# Patient Record
Sex: Female | Born: 1955 | Race: Black or African American | Hispanic: No | Marital: Married | Smoking: Former smoker
Health system: Southern US, Community
[De-identification: ages and names within clinical notes are randomized; demographics above are authoritative.]

## PROBLEM LIST (undated history)

## (undated) DIAGNOSIS — R011 Cardiac murmur, unspecified: Secondary | ICD-10-CM

## (undated) DIAGNOSIS — I071 Rheumatic tricuspid insufficiency: Secondary | ICD-10-CM

## (undated) DIAGNOSIS — C3492 Malignant neoplasm of unspecified part of left bronchus or lung: Secondary | ICD-10-CM

## (undated) DIAGNOSIS — F32A Depression, unspecified: Secondary | ICD-10-CM

## (undated) DIAGNOSIS — I517 Cardiomegaly: Secondary | ICD-10-CM

## (undated) DIAGNOSIS — J986 Disorders of diaphragm: Secondary | ICD-10-CM

## (undated) DIAGNOSIS — D649 Anemia, unspecified: Secondary | ICD-10-CM

## (undated) DIAGNOSIS — E119 Type 2 diabetes mellitus without complications: Secondary | ICD-10-CM

## (undated) DIAGNOSIS — I2510 Atherosclerotic heart disease of native coronary artery without angina pectoris: Secondary | ICD-10-CM

## (undated) DIAGNOSIS — I6789 Other cerebrovascular disease: Secondary | ICD-10-CM

## (undated) DIAGNOSIS — N189 Chronic kidney disease, unspecified: Secondary | ICD-10-CM

## (undated) DIAGNOSIS — F1410 Cocaine abuse, uncomplicated: Secondary | ICD-10-CM

## (undated) DIAGNOSIS — E785 Hyperlipidemia, unspecified: Secondary | ICD-10-CM

## (undated) DIAGNOSIS — Z7982 Long term (current) use of aspirin: Secondary | ICD-10-CM

## (undated) DIAGNOSIS — I739 Peripheral vascular disease, unspecified: Secondary | ICD-10-CM

## (undated) DIAGNOSIS — N184 Chronic kidney disease, stage 4 (severe): Secondary | ICD-10-CM

## (undated) DIAGNOSIS — I10 Essential (primary) hypertension: Secondary | ICD-10-CM

## (undated) DIAGNOSIS — I519 Heart disease, unspecified: Secondary | ICD-10-CM

## (undated) DIAGNOSIS — I361 Nonrheumatic tricuspid (valve) insufficiency: Secondary | ICD-10-CM

## (undated) DIAGNOSIS — I509 Heart failure, unspecified: Secondary | ICD-10-CM

## (undated) DIAGNOSIS — R0600 Dyspnea, unspecified: Secondary | ICD-10-CM

## (undated) DIAGNOSIS — D631 Anemia in chronic kidney disease: Secondary | ICD-10-CM

## (undated) DIAGNOSIS — N185 Chronic kidney disease, stage 5: Secondary | ICD-10-CM

## (undated) DIAGNOSIS — C801 Malignant (primary) neoplasm, unspecified: Secondary | ICD-10-CM

## (undated) HISTORY — PX: LEFT HEART CATH AND CORONARY ANGIOGRAPHY: CATH118249

## (undated) HISTORY — DX: Type 2 diabetes mellitus without complications: E11.9

## (undated) HISTORY — DX: Essential (primary) hypertension: I10

## (undated) HISTORY — DX: Peripheral vascular disease, unspecified: I73.9

## (undated) HISTORY — DX: Depression, unspecified: F32.A

## (undated) HISTORY — PX: OTHER SURGICAL HISTORY: SHX169

## (undated) HISTORY — DX: Hyperlipidemia, unspecified: E78.5

## (undated) HISTORY — PX: PORTA CATH INSERTION: CATH118285

---

## 2004-05-31 ENCOUNTER — Ambulatory Visit: Payer: Self-pay

## 2004-07-06 ENCOUNTER — Ambulatory Visit: Payer: Self-pay

## 2004-08-05 ENCOUNTER — Emergency Department: Payer: Self-pay | Admitting: Unknown Physician Specialty

## 2004-08-06 ENCOUNTER — Emergency Department: Payer: Self-pay | Admitting: Emergency Medicine

## 2005-09-03 ENCOUNTER — Other Ambulatory Visit: Payer: Self-pay

## 2005-09-03 ENCOUNTER — Emergency Department: Payer: Self-pay | Admitting: Emergency Medicine

## 2006-03-05 ENCOUNTER — Other Ambulatory Visit: Payer: Self-pay

## 2006-03-05 ENCOUNTER — Emergency Department: Payer: Self-pay | Admitting: Emergency Medicine

## 2006-04-25 ENCOUNTER — Emergency Department: Payer: Self-pay | Admitting: Emergency Medicine

## 2006-11-16 ENCOUNTER — Emergency Department: Payer: Self-pay | Admitting: Emergency Medicine

## 2006-11-16 ENCOUNTER — Other Ambulatory Visit: Payer: Self-pay

## 2007-06-20 DIAGNOSIS — I219 Acute myocardial infarction, unspecified: Secondary | ICD-10-CM

## 2007-06-20 HISTORY — DX: Acute myocardial infarction, unspecified: I21.9

## 2008-12-13 ENCOUNTER — Emergency Department: Payer: Self-pay | Admitting: Unknown Physician Specialty

## 2010-05-28 ENCOUNTER — Inpatient Hospital Stay: Payer: Self-pay | Admitting: Internal Medicine

## 2010-05-28 DIAGNOSIS — I214 Non-ST elevation (NSTEMI) myocardial infarction: Secondary | ICD-10-CM

## 2010-05-28 HISTORY — DX: Non-ST elevation (NSTEMI) myocardial infarction: I21.4

## 2010-05-30 HISTORY — PX: LEFT HEART CATH AND CORONARY ANGIOGRAPHY: CATH118249

## 2010-06-05 DIAGNOSIS — I214 Non-ST elevation (NSTEMI) myocardial infarction: Secondary | ICD-10-CM | POA: Insufficient documentation

## 2010-06-05 DIAGNOSIS — I252 Old myocardial infarction: Secondary | ICD-10-CM | POA: Insufficient documentation

## 2010-06-06 DIAGNOSIS — E119 Type 2 diabetes mellitus without complications: Secondary | ICD-10-CM | POA: Insufficient documentation

## 2010-06-06 DIAGNOSIS — I2510 Atherosclerotic heart disease of native coronary artery without angina pectoris: Secondary | ICD-10-CM | POA: Insufficient documentation

## 2010-06-06 DIAGNOSIS — E1122 Type 2 diabetes mellitus with diabetic chronic kidney disease: Secondary | ICD-10-CM | POA: Insufficient documentation

## 2012-01-08 ENCOUNTER — Ambulatory Visit: Payer: Self-pay | Admitting: Family Medicine

## 2012-11-11 ENCOUNTER — Emergency Department: Payer: Self-pay | Admitting: Emergency Medicine

## 2012-11-11 LAB — CBC
HCT: 37.8 %
HGB: 13 g/dL
MCH: 30.6 pg
MCHC: 34.5 g/dL
MCV: 89 fL
Platelet: 237 x10 3/mm 3
RBC: 4.25 X10 6/mm 3
RDW: 13.3 %
WBC: 4.2 x10 3/mm 3

## 2012-11-11 LAB — COMPREHENSIVE METABOLIC PANEL WITH GFR
Albumin: 3.4 g/dL
Alkaline Phosphatase: 81 U/L
Anion Gap: 7
BUN: 30 mg/dL — ABNORMAL HIGH
Bilirubin,Total: 0.2 mg/dL
Calcium, Total: 8.9 mg/dL
Chloride: 108 mmol/L — ABNORMAL HIGH
Co2: 24 mmol/L
Creatinine: 1.32 mg/dL — ABNORMAL HIGH
EGFR (African American): 53 — ABNORMAL LOW
EGFR (Non-African Amer.): 46 — ABNORMAL LOW
Glucose: 286 mg/dL — ABNORMAL HIGH
Osmolality: 294
Potassium: 3.9 mmol/L
SGOT(AST): 14 U/L — ABNORMAL LOW
SGPT (ALT): 22 U/L
Sodium: 139 mmol/L
Total Protein: 6.7 g/dL

## 2012-11-11 LAB — URINALYSIS, COMPLETE
Bilirubin,UR: NEGATIVE
Blood: NEGATIVE
Glucose,UR: >=500 mg/dL
Ketone: NEGATIVE
Leukocyte Esterase: NEGATIVE
Nitrite: NEGATIVE
Ph: 5
Protein: 100
RBC,UR: 1 /HPF
Specific Gravity: 1.024
Squamous Epithelial: <1
WBC UR: 3 /HPF

## 2012-11-11 LAB — TROPONIN I: Troponin-I: <0.02 ng/mL

## 2012-11-11 LAB — CK TOTAL AND CKMB (NOT AT ARMC)
CK, Total: 140 U/L (ref 21–215)
CK-MB: 2.5 ng/mL (ref 0.5–3.6)

## 2014-04-01 ENCOUNTER — Observation Stay: Payer: Self-pay | Admitting: Internal Medicine

## 2014-04-01 LAB — COMPREHENSIVE METABOLIC PANEL
AST: 13 U/L — AB (ref 15–37)
Albumin: 3.3 g/dL — ABNORMAL LOW (ref 3.4–5.0)
Alkaline Phosphatase: 83 U/L
Anion Gap: 4 — ABNORMAL LOW (ref 7–16)
BUN: 21 mg/dL — ABNORMAL HIGH (ref 7–18)
Bilirubin,Total: 0.3 mg/dL (ref 0.2–1.0)
CALCIUM: 8.4 mg/dL — AB (ref 8.5–10.1)
CO2: 32 mmol/L (ref 21–32)
Chloride: 105 mmol/L (ref 98–107)
Creatinine: 0.85 mg/dL (ref 0.60–1.30)
EGFR (African American): >60
EGFR (Non-African Amer.): >60
Glucose: 217 mg/dL — ABNORMAL HIGH (ref 65–99)
OSMOLALITY: 291 (ref 275–301)
Potassium: 3.9 mmol/L (ref 3.5–5.1)
SGPT (ALT): 19 U/L
Sodium: 141 mmol/L (ref 136–145)
TOTAL PROTEIN: 6.7 g/dL (ref 6.4–8.2)

## 2014-04-01 LAB — CBC
HCT: 41.8 % (ref 35.0–47.0)
HGB: 13.8 g/dL (ref 12.0–16.0)
MCH: 29.8 pg (ref 26.0–34.0)
MCHC: 33.1 g/dL (ref 32.0–36.0)
MCV: 90 fL (ref 80–100)
PLATELETS: 258 10*3/uL (ref 150–440)
RBC: 4.65 10*6/uL (ref 3.80–5.20)
RDW: 14 % (ref 11.5–14.5)
WBC: 4.7 10*3/uL (ref 3.6–11.0)

## 2014-04-01 LAB — DRUG SCREEN, URINE
Amphetamines, Ur Screen: NEGATIVE (ref ?–1000)
Barbiturates, Ur Screen: NEGATIVE (ref ?–200)
Benzodiazepine, Ur Scrn: NEGATIVE (ref ?–200)
Cannabinoid 50 Ng, Ur ~~LOC~~: NEGATIVE (ref ?–50)
Cocaine Metabolite,Ur ~~LOC~~: NEGATIVE (ref ?–300)
MDMA (ECSTASY) UR SCREEN: NEGATIVE (ref ?–500)
Methadone, Ur Screen: NEGATIVE (ref ?–300)
Opiate, Ur Screen: NEGATIVE (ref ?–300)
Phencyclidine (PCP) Ur S: NEGATIVE (ref ?–25)
TRICYCLIC, UR SCREEN: NEGATIVE (ref ?–1000)

## 2014-04-01 LAB — LIPID PANEL
CHOLESTEROL: 209 mg/dL — AB (ref 0–200)
HDL Cholesterol: 43 mg/dL (ref 40–60)
Ldl Cholesterol, Calc: 147 mg/dL — ABNORMAL HIGH (ref 0–100)
Triglycerides: 94 mg/dL (ref 0–200)
VLDL Cholesterol, Calc: 19 mg/dL (ref 5–40)

## 2014-04-01 LAB — CK TOTAL AND CKMB (NOT AT ARMC)
CK, TOTAL: 68 U/L
CK, Total: 63 U/L
CK, Total: 55 U/L
CK-MB: 0.6 ng/mL (ref 0.5–3.6)
CK-MB: 0.5 ng/mL (ref 0.5–3.6)
CK-MB: 0.6 ng/mL (ref 0.5–3.6)

## 2014-04-01 LAB — TROPONIN I: Troponin-I: <0.02 ng/mL

## 2014-04-01 LAB — HEMOGLOBIN A1C: HEMOGLOBIN A1C: 10.9 % — AB (ref 4.2–6.3)

## 2014-04-01 LAB — APTT: Activated PTT: 24.1 secs (ref 23.6–35.9)

## 2014-04-02 DIAGNOSIS — R079 Chest pain, unspecified: Secondary | ICD-10-CM

## 2014-04-02 LAB — BASIC METABOLIC PANEL
Anion Gap: 5 — ABNORMAL LOW (ref 7–16)
BUN: 19 mg/dL — AB (ref 7–18)
CREATININE: 0.75 mg/dL (ref 0.60–1.30)
Calcium, Total: 8.1 mg/dL — ABNORMAL LOW (ref 8.5–10.1)
Chloride: 108 mmol/L — ABNORMAL HIGH (ref 98–107)
Co2: 28 mmol/L (ref 21–32)
GLUCOSE: 249 mg/dL — AB (ref 65–99)
Osmolality: 292 (ref 275–301)
Potassium: 3.8 mmol/L (ref 3.5–5.1)
Sodium: 141 mmol/L (ref 136–145)

## 2014-04-02 LAB — CBC WITH DIFFERENTIAL/PLATELET
BASOS ABS: 0 10*3/uL (ref 0.0–0.1)
Basophil %: 1.1 %
EOS PCT: 5.4 %
Eosinophil #: 0.2 10*3/uL (ref 0.0–0.7)
HCT: 38.7 % (ref 35.0–47.0)
HGB: 12.9 g/dL (ref 12.0–16.0)
LYMPHS PCT: 40.1 %
Lymphocyte #: 1.5 10*3/uL (ref 1.0–3.6)
MCH: 29.7 pg (ref 26.0–34.0)
MCHC: 33.4 g/dL (ref 32.0–36.0)
MCV: 89 fL (ref 80–100)
MONOS PCT: 8.5 %
Monocyte #: 0.3 x10 3/mm (ref 0.2–0.9)
NEUTROS ABS: 1.7 10*3/uL (ref 1.4–6.5)
NEUTROS PCT: 44.9 %
Platelet: 251 10*3/uL (ref 150–440)
RBC: 4.36 10*6/uL (ref 3.80–5.20)
RDW: 13.9 % (ref 11.5–14.5)
WBC: 3.8 10*3/uL (ref 3.6–11.0)

## 2014-04-06 ENCOUNTER — Encounter: Payer: Self-pay | Admitting: Cardiovascular Disease

## 2014-10-10 NOTE — Discharge Summary (Signed)
PATIENT NAME:  [name] [name], [name] [name] MR#:  [id number] DATE OF BIRTH:  [birth year]  DATE OF ADMISSION:  04/01/2014 DATE OF DISCHARGE:  04/02/2014  ADMITTING DIAGNOSES: Chest pain.   DISCHARGE DIAGNOSES: 1.  History of coronary artery disease.  2.  Hypertension.  3.  Diabetes mellitus type 2, uncontrolled without complication.  4.  Hyperlipidemia.  5.  Tobacco abuse.   CONSULTATIONS: None.   PROCEDURES: Stress Myoview performed April 02, 2014 shows no signs of ischemia, no significant ST or T changes.   BRIEF HISTORY OF PRESENT ILLNESS:  This very pleasant 59 year old female with past medical history of coronary artery disease status post myocardial infarction in 2011 treated at [facility], hypertension, diabetes, hyperlipidemia who has not been on any medications for 5 years at this point presents with chest pain. She states that she recently used cocaine on her birthday, which was 2-1/2 weeks ago. She has been having pain for 4 days. Pain is worse with exertion. She describes heavy pressure in the chest radiating to the back. She also feels that she has had significant dyspnea on exertion recently.   HOSPITAL COURSE AND TREATMENT: 1.  Chest pain: The patient was admitted for acute coronary syndrome Rule out. She did not have any significant changes on her EKG. Cardiac enzymes were negative x3. She had a stress Myoview on the morning of October 15th, which was normal. She is being discharged and will need to follow up with her primary care physician to further evaluate her chest.  2.  History of coronary artery disease: This patient has not had any medications or treatment for about 5 years. She was restarted on aspirin. Also initiated treatment of comorbidities including diabetes and hyperlipidemia.  3.  Diabetes mellitus, type 2, uncontrolled without complication: Hemoglobin A1c is 10.9. She met with diabetes education while in the hospital. She was given information on where to get a glucometer and test  strips at low price. She was given a prescription for Humulin N 7 units subcutaneously twice a day, and she was instructed on how to take this medication as well as signs of hypoglycemia. She does not tolerate metformin, so this medication was not prescribed.  4.  Hyperlipidemia: In this patient a goal LDL would be less than 70. LDL checked during admission is 147. She was started on Pravachol 40 mg daily. This is a 4 dollar medication. She should be able to afford it in the outpatient setting.  5.  Hypertension: Blood pressures were actually fairly reasonable during hospitalization without medication. At the time of discharge, she is 132/80, not on any medications.  6.  Ongoing tobacco abuse: She was counseled on the benefits of quitting smoking. Declined offer of prescription for nicotine replacement.   DISCHARGE PHYSICAL EXAMINATION: VITAL SIGNS: Temperature 98.3, pulse 63, respirations 18, blood pressure 132/80, oxygenation 99% on room air.  GENERAL: No acute distress.  CARDIOVASCULAR: Regular rate and rhythm. No murmurs, rubs, or gallops. No carotid bruit, no JVD. No peripheral edema. Peripheral pulses are 2+.  RESPIRATORY: Lungs are clear to auscultation bilaterally with good air movement.  ABDOMEN: Soft, nontender, nondistended. Bowel sounds are normal.  NEUROLOGIC: Cranial nerves II through XII are grossly intact. Strength and sensation are intact, nonfocal neurologic examination.  PSYCHIATRIC: The patient is alert and oriented, has good insight.   LABORATORY DATA: Sodium 141, potassium 3.8, chloride 108, bicarb 28, BUN 19, creatinine 0.75, blood sugar 249. Hemoglobin A1c 10.9. Total cholesterol 209, LDL 147, HDL 43. LFTs normal. Cardiac enzymes  negative x3. Urine drug screen negative. White blood cells 3.8, hemoglobin 12.9, platelets 251,000, MCV 89.   CONDITION ON DISCHARGE: Stable.  DISPOSITION: Discharged to home with no home health needs.   DISCHARGE MEDICATIONS: 1.  Aspirin 81 mg  daily.  2.  Pravachol 40 mg 1 tablet daily.  3.  Humulin N 100 units/mL subcutaneous suspension 7 units subcutaneously twice a day.   DISCHARGE INSTRUCTIONS: 1.  Diet: ADA carb-modified low fat diet. 2.  Activity: No restrictions.  3.  Follow-up: The patient is to follow up with her primary care provider within the next 1 to 2 weeks after discharge.   TIME SPENT ON DISCHARGE: 40 minutes. ____________________________ Earleen Newport. Volanda Napoleon, MD cpw:sb D: 04/02/2014 15:41:40 ET T: 04/02/2014 16:01:03 ET JOB#: [id number]  cc: Barnetta Chapel P. Volanda Napoleon, MD, <Dictator> Aldean Jewett MD ELECTRONICALLY SIGNED 04/04/2014 21:42

## 2014-10-10 NOTE — H&P (Signed)
PATIENT NAME:  [name] [name], [name] [name] MR#:  [id number] DATE OF BIRTH:  10/18/1955  DATE OF ADMISSION:  04/01/2014  ADMITTING PHYSICIAN: Gladstone Lighter, M.D.   PRIMARY CARE PHYSICIAN: [place].  CHIEF COMPLAINT: Chest pain.   HISTORY OF PRESENT ILLNESS: [name] [name] is a 59 year old, African American female, with past medical history significant for hypertension, diabetes, hyperlipidemia not taking any medications for 5 years now, history of coronary artery disease status post MI in 2011, at [facility]. According to the patient, she did not have any stents put in. She had an angiogram done, however, that did reveal that she had collaterals and small arteries were blocked. The patient continues to smoke cigars every day. She also states she used some cocaine on her birthday, which was about 2-1/2 weeks ago. She has been having the pain for almost 4 days now. She says she does not feel the pain as much when she sits down and rests, but whenever she gets up on her feet to walk, she feels heavy pressure on the chest radiating to the back and also to her arm. Denies any diaphoresis associated with that. Mild nausea is also noted. She does not seem like she has significant dyspnea on exertion, as well.   PAST MEDICAL HISTORY: 1. Hypertension.  2. Diabetes.  3. Coronary artery disease.  4. Hyperlipidemia.  5. Ongoing smoking.   PAST SURGICAL HISTORY: None.  ALLERGIES TO MEDICATIONS: No known drug allergies.   CURRENT HOME MEDICATIONS: Aspirin 3 mg p.o. daily. Does not take any other medications.   SOCIAL HISTORY: Lives at home with her son, smokes about 3 to 4 cigars every day. Stopped smoking cigarettes more than 20 years ago. No alcohol abuse. Again, first time use of cocaine about 2-1/2 weeks ago.   FAMILY HISTORY: Significant for mom died from a major heart attack in her 43s.   REVIEW OF SYSTEMS:  CONSTITUTIONAL: No fever, fatigue, or weakness.  EYES: No blurred vision, double vision, inflammation or  glaucoma.  ENT: No tinnitus, ear pain, hearing loss, epistaxis or discharge.  RESPIRATORY: No cough, wheeze, hemoptysis or COPD.  CARDIOVASCULAR: Positive for chest pain. No orthopnea, edema, arrhythmia, palpitations, or syncope.  GASTROINTESTINAL: Positive for some nausea. No vomiting, diarrhea, abdominal pain, hematemesis, or melena.  GENITOURINARY: No dysuria, hematuria, renal calculus, frequency, or incontinence.  ENDOCRINE: No polyuria, nocturia, thyroid problems, heat or cold intolerance.  HEMATOLOGY: No anemia, easy bruising or bleeding.  SKIN: No acne, rash or lesions.  MUSCULOSKELETAL: No neck, back, shoulder pain, arthritis or gout.  NEUROLOGIC: No numbness, weakness, CVA, TIA or seizures.  PSYCHOLOGIC: No anxiety, insomnia or depression.   PHYSICAL EXAMINATION:  VITAL SIGNS: Temperature 98 degrees Fahrenheit, pulse 71, respirations 18, blood pressure 172/89, pulse oximetry 99% on room air.  GENERAL: Well built, well-nourished female lying in bed, not in any acute distress.  HEENT: Normocephalic, atraumatic. Pupils equal, round, reacting to light. Anicteric sclerae. Extraocular movements intact. Oropharynx clear without erythema, mass or exudates.  NECK: Supple. No thyromegaly, JVD or carotid bruits. No lymphadenopathy.  LUNGS: Moving air bilaterally. No wheeze or crackles. No use of accessory muscles for breathing.  CARDIOVASCULAR: S1, S2, regular rate and rhythm. No murmurs, rubs, or gallops.  ABDOMEN: Soft, nontender, nondistended. No hepatosplenomegaly. Normal bowel sounds.  EXTREMITIES: No pedal edema. No clubbing or cyanosis, 2+ dorsalis pedis pulses palpable bilaterally.  SKIN: No acne, rash or lesions.  LYMPHATICS: No cervical or inguinal lymphadenopathy.  NEUROLOGIC: Cranial nerves intact. No focal, motor or sensory deficits.  Cerebellar function tests are normal.  PSYCHOLOGIC: The patient is awake, alert, oriented x 3.   LABORATORY DATA: WBC 4.7, hemoglobin 13.8,  hematocrit 41.8, platelet count 258,000.   Sodium 141, potassium 3.9, chloride 105, bicarbonate 32, BUN 21, creatinine 0.85, glucose 217, calcium of 8.4.   ALT 19, AST 13, alkaline phosphatase 83, total bilirubin 0.3, albumin of 3.3. CK 263, CK-MB 0.6. Troponin less than 0.02, PTT 24.1.   Chest x-ray showing clear lung fields. No acute cardiopulmonary disease.   EKG showing normal sinus rhythm, heart rate of 65, no acute ST-T wave abnormalities.   ASSESSMENT AND PLAN: A 59 year old female with diabetes, hypertension not on any medications, and also history of coronary artery disease, admitted for chest pain.   1. Chest pain, likely unstable angina, multiple risk factors presented. We will admit to telemetry, get a Myoview tomorrow. Follow up troponins. Also history of cocaine use 2 weeks ago. Start aspirin for now.  2. Hypertension. Monitor blood pressure. Add medications as needed. We will hold off on beta blockers until the drug screen is back.  3. Diabetes mellitus. Checked HbA1c, showed already elevated, will need medications at discharge. Start on sliding scale insulin for now.  4. Deep vein thrombosis prophylaxis.   CODE STATUS: Full Code.   TIME SPENT ON ADMISSION: 50 minutes.   ____________________________ Gladstone Lighter, MD rk:JT D: 04/01/2014 12:29:07 ET T: 04/01/2014 13:29:24 ET JOB#: [id number]  cc: Gladstone Lighter, MD, <Dictator> [place] MD ELECTRONICALLY SIGNED 04/01/2014 18:19

## 2014-11-26 DIAGNOSIS — F17200 Nicotine dependence, unspecified, uncomplicated: Secondary | ICD-10-CM | POA: Insufficient documentation

## 2016-11-16 DIAGNOSIS — F1490 Cocaine use, unspecified, uncomplicated: Secondary | ICD-10-CM | POA: Insufficient documentation

## 2017-12-16 ENCOUNTER — Other Ambulatory Visit: Payer: Self-pay

## 2017-12-16 ENCOUNTER — Emergency Department
Admission: EM | Admit: 2017-12-16 | Discharge: 2017-12-16 | Disposition: A | Discharge status: Home / Self Care | Payer: BLUE CROSS/BLUE SHIELD | Attending: Emergency Medicine | Admitting: Emergency Medicine

## 2017-12-16 DIAGNOSIS — F419 Anxiety disorder, unspecified: Secondary | ICD-10-CM | POA: Diagnosis not present

## 2017-12-16 DIAGNOSIS — F17210 Nicotine dependence, cigarettes, uncomplicated: Secondary | ICD-10-CM | POA: Insufficient documentation

## 2017-12-16 DIAGNOSIS — R350 Frequency of micturition: Secondary | ICD-10-CM | POA: Insufficient documentation

## 2017-12-16 DIAGNOSIS — N390 Urinary tract infection, site not specified: Secondary | ICD-10-CM

## 2017-12-16 DIAGNOSIS — E1165 Type 2 diabetes mellitus with hyperglycemia: Secondary | ICD-10-CM | POA: Diagnosis not present

## 2017-12-16 DIAGNOSIS — F410 Panic disorder [episodic paroxysmal anxiety] without agoraphobia: Secondary | ICD-10-CM | POA: Diagnosis present

## 2017-12-16 DIAGNOSIS — R739 Hyperglycemia, unspecified: Secondary | ICD-10-CM

## 2017-12-16 HISTORY — DX: Type 2 diabetes mellitus without complications: E11.9

## 2017-12-16 LAB — URINALYSIS, COMPLETE (UACMP) WITH MICROSCOPIC
Bilirubin Urine: NEGATIVE
KETONES UR: NEGATIVE mg/dL
LEUKOCYTES UA: NEGATIVE
NITRITE: NEGATIVE
PH: 5 (ref 5.0–8.0)
Protein, ur: NEGATIVE mg/dL
Specific Gravity, Urine: 1.022 (ref 1.005–1.030)

## 2017-12-16 LAB — BASIC METABOLIC PANEL
Anion gap: 7 (ref 5–15)
BUN: 34 mg/dL — AB (ref 6–20)
CALCIUM: 8.6 mg/dL — AB (ref 8.9–10.3)
CHLORIDE: 100 mmol/L (ref 98–111)
CO2: 25 mmol/L (ref 22–32)
Creatinine, Ser: 1 mg/dL (ref 0.44–1.00)
GFR calc non Af Amer: >60 mL/min (ref >60)
GLUCOSE: 747 mg/dL — AB (ref 70–99)
Potassium: 4.6 mmol/L (ref 3.5–5.1)
Sodium: 132 mmol/L — ABNORMAL LOW (ref 135–145)

## 2017-12-16 LAB — BLOOD GAS, VENOUS
ACID-BASE DEFICIT: 2.5 mmol/L — AB (ref 0.0–2.0)
BICARBONATE: 24.9 mmol/L (ref 20.0–28.0)
O2 SAT: 56.4 %
PATIENT TEMPERATURE: 37
pCO2, Ven: 53 mmHg (ref 44.0–60.0)
pH, Ven: 7.28 (ref 7.250–7.430)
pO2, Ven: 34 mmHg (ref 32.0–45.0)

## 2017-12-16 LAB — CBC
HCT: 36.7 % (ref 35.0–47.0)
Hemoglobin: 12.3 g/dL (ref 12.0–16.0)
MCH: 30.5 pg (ref 26.0–34.0)
MCHC: 33.4 g/dL (ref 32.0–36.0)
MCV: 91.3 fL (ref 80.0–100.0)
PLATELETS: 307 10*3/uL (ref 150–440)
RBC: 4.02 MIL/uL (ref 3.80–5.20)
RDW: 13.5 % (ref 11.5–14.5)
WBC: 5.1 10*3/uL (ref 3.6–11.0)

## 2017-12-16 LAB — GLUCOSE, CAPILLARY
GLUCOSE-CAPILLARY: 112 mg/dL — AB (ref 70–99)
Glucose-Capillary: >600 mg/dL (ref 70–99)
Glucose-Capillary: 183 mg/dL — ABNORMAL HIGH (ref 70–99)

## 2017-12-16 LAB — POC URINE PREG, ED: Preg Test, Ur: NEGATIVE

## 2017-12-16 MED ORDER — INSULIN GLARGINE 100 UNIT/ML ~~LOC~~ SOLN: SUBCUTANEOUS | 0 refills | Status: DC

## 2017-12-16 MED ORDER — SODIUM CHLORIDE 0.9 % IV BOLUS
1000.0000 mL | Freq: Once | INTRAVENOUS | Status: AC
  Administered 2017-12-16: 1000 mL via INTRAVENOUS

## 2017-12-16 MED ORDER — METFORMIN HCL 500 MG PO TABS: 500.0000 mg | ORAL_TABLET | Freq: Two times a day (BID) | ORAL | 0 refills | Status: DC

## 2017-12-16 MED ORDER — INSULIN ASPART 100 UNIT/ML ~~LOC~~ SOLN
10.0000 [IU] | Freq: Once | SUBCUTANEOUS | Status: AC
  Administered 2017-12-16: 10 [IU] via INTRAVENOUS
  Filled 2017-12-16: qty 1

## 2017-12-16 MED ORDER — SODIUM CHLORIDE 0.9 % IV SOLN
1.0000 g | Freq: Once | INTRAVENOUS | Status: AC
  Administered 2017-12-16: 1 g via INTRAVENOUS
  Filled 2017-12-16: qty 10

## 2017-12-16 MED ORDER — CEPHALEXIN 500 MG PO CAPS: 500.0000 mg | ORAL_CAPSULE | Freq: Two times a day (BID) | ORAL | 0 refills | Status: AC

## 2017-12-16 NOTE — ED Triage Notes (Signed)
Pt states she has increased urination so thinking she has hyperglycemia. Type 2 DM. Doesn't know last time she checked her sugar, can't find her meter. Doesn't take any medication for DM. States has been out of her pills and insulin x 2 weeks. Also had a panic attack at work, states that has resolved. States her claustrophobia started her panic attack today. No hx of same. Alert, oriented, ambulatory. No distress noted.

## 2017-12-16 NOTE — ED Notes (Signed)
Pt eating sandwich tray at this time

## 2017-12-16 NOTE — ED Provider Notes (Addendum)
[facility] Emergency Department Provider Note  Time seen: 3:25 PM  I have reviewed the triage vital signs and the nursing notes.   HISTORY  Chief Complaint Hyperglycemia    HPI [name] [name] is a 62 y.o. female with a past medical history of diabetes on Lantus and metformin who presents to the emergency department for symptoms of a panic attack and frequent urination.  According to the patient for the past week or so she has been urinating very frequently she states that x5 or 6 times during the nighttime.  States mild dysuria.  She states the main reason for visit today is she was in a very tight space working today, states she is claustrophobic and had a "panic attack."  States she does not like to be in small spaces.  She started to feel like she could not breathe, she states once she was able to leave that room and going to an open space she felt better, but still wanted to get evaluated due to the frequent urination and states she has been taking her blood glucose at home and it has been greater than 300, states she ran out of her medications approximately 1 or 2 weeks ago.  Was taking 16 units of Lantus at night and metformin once daily per patient.  Denies any fever, cough or congestion chest pain, abdominal pain, nausea vomiting.  Does state loose stool on occasion as well as slight dysuria.   Past Medical History:  Diagnosis Date  . Diabetes mellitus without complication ([place])     There are no active problems to display for this patient.   History reviewed. No pertinent surgical history.  Prior to Admission medications   Not on File    No Known Allergies  History reviewed. No pertinent family history.  Social History Social History   Tobacco Use  . Smoking status: Current Every Day Smoker  Substance Use Topics  . Alcohol use: Never    Frequency: Never  . Drug use: Not on file    Review of Systems Constitutional: Negative for  fever. Eyes: Negative for visual complaints ENT: Negative for recent illness/congestion Cardiovascular: Negative for chest pain. Respiratory: Negative for shortness of breath. Gastrointestinal: Negative for abdominal pain, vomiting.  Mild loose stool. Genitourinary: Mild dysuria with urinary frequency. Musculoskeletal: Negative for musculoskeletal complaints Skin: Negative for skin complaints  Neurological: Negative for headache All other ROS negative  ____________________________________________   PHYSICAL EXAM:  VITAL SIGNS: ED Triage Vitals  Enc Vitals Group     BP 12/16/17 1444 (!) 154/83     Pulse Rate 12/16/17 1444 92     Resp 12/16/17 1444 16     Temp 12/16/17 1444 98.1 F (36.7 C)     Temp Source 12/16/17 1444 Oral     SpO2 12/16/17 1444 99 %     Weight 12/16/17 1445 100 lb (45.4 kg)     Height 12/16/17 1445 5\' 5"  (1.651 m)     Head Circumference --      Peak Flow --      Pain Score 12/16/17 1444 10     Pain Loc --      Pain Edu? --      Excl. in [place]? --     Constitutional: Alert and oriented. Well appearing and in no distress. Eyes: Normal exam ENT   Head: Normocephalic and atraumatic.   Mouth/Throat: Mucous membranes are moist. Cardiovascular: Normal rate, regular rhythm. No murmur Respiratory: Normal respiratory effort  without tachypnea nor retractions. Breath sounds are clear Gastrointestinal: Soft and nontender. No distention.  Musculoskeletal: Nontender with normal range of motion in all extremities.  Neurologic:  Normal speech and language. No gross focal neurologic deficits  Skin:  Skin is warm, dry and intact.  Psychiatric: Mood and affect are normal. ____________________________________________  INITIAL IMPRESSION / ASSESSMENT AND PLAN / ED COURSE  Pertinent labs & imaging results that were available during my care of the patient were reviewed by me and considered in my medical decision making (see chart for details).  Patient presents to  the emergency department for frequent urination, panic attack and high blood glucose at home.  Differential includes hyperglycemia, DKA, HHS, anxiety, dyspnea due to other reason.  Overall the patient appears very well, no distress.  States her symptoms completely resolved after leaving the small room, highly suspect claustrophobia/panic disorder.  For her secondary complaint the patient states she has been out of her diabetic medications for greater than 1 week, has been checking her blood glucose at times at home and it has been over 300 per patient and she has been urinating very frequently, suspect likely hyperglycemia/HHS/DKA, possible urinary tract infection, will check urinalysis we will begin IV hydration.  Patient's VBG does show a pH of 7.28, will await anion gap as well as potassium results before dosing insulin.  Patient's vitals are very reassuring, she states she does not want to be admitted to the hospital we will attempt IV hydration and glucose correction in the emergency department as long as the patient does not have a significant anion gap.  Patient's blood glucose of 747 has decreased to near normal levels after IV fluids and insulin.  The remainder the patient's work-up is consistent for urinary tract infection, patient has been dose Rocephin.  Patient feels well, is asking to be discharged home, has eaten and drinking in the emergency department without issue.  We will discharge with antibiotics and refills of her metformin and Lantus medications.  Patient agreeable to plan of care.  Has a doctor appointment scheduled but not for 2 weeks.  We will provide a work note including her diagnosis per patient request.    ____________________________________________   FINAL CLINICAL IMPRESSION(S) / ED DIAGNOSES  Hyperglycemia Anxiety    Harvest Dark, MD 12/16/17 1821    Harvest Dark, MD 12/16/17 [phone]

## 2017-12-16 NOTE — ED Notes (Signed)
First Nurse Note: Pt to ED c/o high blood sugar. Pt states that she has hx/o DM

## 2017-12-19 LAB — URINE CULTURE: Culture: >=100000 — AB

## 2018-06-19 HISTORY — PX: OTHER SURGICAL HISTORY: SHX169

## 2018-07-08 ENCOUNTER — Emergency Department
Admission: EM | Admit: 2018-07-08 | Discharge: 2018-07-08 | Disposition: A | Discharge status: Home / Self Care | Payer: BLUE CROSS/BLUE SHIELD | Attending: Emergency Medicine | Admitting: Emergency Medicine

## 2018-07-08 ENCOUNTER — Other Ambulatory Visit: Payer: Self-pay

## 2018-07-08 ENCOUNTER — Emergency Department: Payer: BLUE CROSS/BLUE SHIELD

## 2018-07-08 DIAGNOSIS — E119 Type 2 diabetes mellitus without complications: Secondary | ICD-10-CM | POA: Diagnosis not present

## 2018-07-08 DIAGNOSIS — F17210 Nicotine dependence, cigarettes, uncomplicated: Secondary | ICD-10-CM | POA: Diagnosis not present

## 2018-07-08 DIAGNOSIS — M25562 Pain in left knee: Secondary | ICD-10-CM | POA: Diagnosis present

## 2018-07-08 MED ORDER — NAPROXEN 500 MG PO TABS: 500.0000 mg | ORAL_TABLET | Freq: Two times a day (BID) | ORAL | 0 refills | Status: DC

## 2018-07-08 MED ORDER — KETOROLAC TROMETHAMINE 30 MG/ML IJ SOLN
30.0000 mg | Freq: Once | INTRAMUSCULAR | Status: AC
  Administered 2018-07-08: 30 mg via INTRAMUSCULAR
  Filled 2018-07-08: qty 1

## 2018-07-08 MED ORDER — TRAMADOL HCL 50 MG PO TABS: 50.0000 mg | ORAL_TABLET | Freq: Four times a day (QID) | ORAL | 0 refills | Status: DC | PRN

## 2018-07-08 MED ORDER — OXYCODONE-ACETAMINOPHEN 5-325 MG PO TABS
1.0000 | ORAL_TABLET | Freq: Once | ORAL | Status: AC
  Administered 2018-07-08: 1 via ORAL
  Filled 2018-07-08: qty 1

## 2018-07-08 NOTE — ED Provider Notes (Signed)
[facility] Emergency Department Provider Note  ____________________________________________  Time seen: Approximately 10:53 AM  I have reviewed the triage vital signs and the nursing notes.   HISTORY  Chief Complaint Knee Pain    HPI [name] [name] is a 63 y.o. female presents emergency department for evaluation of left knee pain for 2 days.  Patient states that pain is primarily over the front and inside of her knee.  She does not have any pain to the back of her knee or lower leg.  Patient states that it feels like a "bone needs to pop."  She is having difficulty getting comfortable due to pain. She describes the pain as throbbing.  No trauma.  This is never happened before.  She denies any IV drug use.  No wounds.  No history of gout.  No fever or chills.  She took aspirin last night, without relief.  Past Medical History:  Diagnosis Date  . Diabetes mellitus without complication ([place])     There are no active problems to display for this patient.   History reviewed. No pertinent surgical history.  Prior to Admission medications   Medication Sig Start Date End Date Taking? Authorizing Provider  insulin glargine (LANTUS) 100 UNIT/ML injection Please inject 16 units SQ each night 12/16/17 12/16/18  Harvest Dark, MD  metFORMIN (GLUCOPHAGE) 500 MG tablet Take 1 tablet (500 mg total) by mouth 2 (two) times daily with a meal. 12/16/17 12/16/18  Harvest Dark, MD  naproxen (NAPROSYN) 500 MG tablet Take 1 tablet (500 mg total) by mouth 2 (two) times daily with a meal. 07/08/18 07/08/19  Laban Emperor, PA-C  traMADol (ULTRAM) 50 MG tablet Take 1 tablet (50 mg total) by mouth every 6 (six) hours as needed. 07/08/18 07/08/19  Laban Emperor, PA-C    Allergies Patient has no known allergies.  No family history on file.  Social History Social History   Tobacco Use  . Smoking status: Current Every Day Smoker  . Smokeless tobacco: Never Used  Substance  Use Topics  . Alcohol use: Never    Frequency: Never  . Drug use: Not Currently     Review of Systems  Constitutional: No fever/chills Cardiovascular: No chest pain. Respiratory: No SOB. Gastrointestinal: No abdominal pain.  No nausea, no vomiting.  Musculoskeletal: Positive for knee pain. Skin: Negative for rash, abrasions, lacerations, ecchymosis. Neurological: Negative for numbness or tingling   ____________________________________________   PHYSICAL EXAM:  VITAL SIGNS: ED Triage Vitals  Enc Vitals Group     BP 07/08/18 1047 120/82     Pulse Rate 07/08/18 1047 88     Resp 07/08/18 1047 15     Temp 07/08/18 1047 98.9 F (37.2 C)     Temp Source 07/08/18 1047 Oral     SpO2 07/08/18 1047 99 %     Weight 07/08/18 1048 100 lb (45.4 kg)     Height 07/08/18 1048 5\' 5"  (1.651 m)     Head Circumference --      Peak Flow --      Pain Score --      Pain Loc --      Pain Edu? --      Excl. in [place]? --      Constitutional: Alert and oriented. Well appearing and in no acute distress. Eyes: Conjunctivae are normal. PERRL. EOMI. Head: Atraumatic. ENT:      Ears:      Nose: No congestion/rhinnorhea.      Mouth/Throat: Mucous  membranes are moist.  Neck: No stridor. Cardiovascular: Normal rate, regular rhythm.  Good peripheral circulation. Respiratory: Normal respiratory effort without tachypnea or retractions. Lungs CTAB. Good air entry to the bases with no decreased or absent breath sounds. Musculoskeletal: Full range of motion to all extremities. No gross deformities appreciated.  Mild tenderness to palpation over medial knee.  Full range of motion of knee but with pain.  No tenderness to palpation to posterior knee.  No calf tenderness.  No erythema or visible swelling. Neurologic:  Normal speech and language. No gross focal neurologic deficits are appreciated.  Skin:  Skin is warm, dry and intact. No rash noted. Psychiatric: Mood and affect are normal. Speech and behavior  are normal. Patient exhibits appropriate insight and judgement.   ____________________________________________   LABS (all labs ordered are listed, but only abnormal results are displayed)  Labs Reviewed - No data to display ____________________________________________  EKG   ____________________________________________  RADIOLOGY Robinette Haines, personally viewed and evaluated these images (plain radiographs) as part of my medical decision making, as well as reviewing the written report by the radiologist.  Dg Knee Complete 4 Views Left  Result Date: 07/08/2018 CLINICAL DATA:  Knee pain. EXAM: LEFT KNEE - COMPLETE 4+ VIEW COMPARISON:  None FINDINGS: No evidence of fracture, dislocation, or joint effusion. No evidence of arthropathy or other focal bone abnormality. Soft tissues are unremarkable. IMPRESSION: Negative. Electronically Signed   By: Kerby Moors M.D.   On: 07/08/2018 11:14    ____________________________________________    PROCEDURES  Procedure(s) performed:    Procedures    Medications  ketorolac (TORADOL) 30 MG/ML injection 30 mg (30 mg Intramuscular Given 07/08/18 1137)  oxyCODONE-acetaminophen (PERCOCET/ROXICET) 5-325 MG per tablet 1 tablet (1 tablet Oral Given 07/08/18 1136)     ____________________________________________   INITIAL IMPRESSION / ASSESSMENT AND PLAN / ED COURSE  Pertinent labs & imaging results that were available during my care of the patient were reviewed by me and considered in my medical decision making (see chart for details).  Review of the [place] CSRS was performed in accordance of the [place] prior to dispensing any controlled drugs.     Patient presented to emergency department for evaluation of knee pain.  Vital signs and exam are reassuring.  Exam is overall unremarkable.  Knee x-ray negative for acute abnormalities.  No signs of infection or gout.  Knee was Ace wrapped.  Crutches were given.  Patient will be discharged  home with prescriptions for naproxen and a short course of tramadol. Patient is to follow up with primary care as directed. Patient is given ED precautions to return to the ED for any worsening or new symptoms.     ____________________________________________  FINAL CLINICAL IMPRESSION(S) / ED DIAGNOSES  Final diagnoses:  Acute pain of left knee      NEW MEDICATIONS STARTED DURING THIS VISIT:  ED Discharge Orders         Ordered    Knee brace     07/08/18 1156    naproxen (NAPROSYN) 500 MG tablet  2 times daily with meals     07/08/18 1156    traMADol (ULTRAM) 50 MG tablet  Every 6 hours PRN     07/08/18 1156              This chart was dictated using voice recognition software/Dragon. Despite best efforts to proofread, errors can occur which can change the meaning. Any change was purely unintentional.    Laban Emperor, PA-C  07/08/18 1459    Earleen Newport, MD 07/08/18 1504

## 2018-07-08 NOTE — ED Notes (Signed)
See triage note presents with pain to left knee   Pain is mainly to lateral aspect  With min swelling  Denies any recent injury

## 2018-07-08 NOTE — ED Triage Notes (Signed)
Pt c/o left knee pain since last night, denies any injury or swelling.

## 2018-10-19 ENCOUNTER — Other Ambulatory Visit: Payer: Self-pay

## 2018-10-19 ENCOUNTER — Encounter: Payer: Self-pay | Admitting: Emergency Medicine

## 2018-10-19 ENCOUNTER — Emergency Department
Admission: EM | Admit: 2018-10-19 | Discharge: 2018-10-19 | Disposition: A | Discharge status: Home / Self Care | Payer: BLUE CROSS/BLUE SHIELD | Attending: Emergency Medicine | Admitting: Emergency Medicine

## 2018-10-19 DIAGNOSIS — E11649 Type 2 diabetes mellitus with hypoglycemia without coma: Secondary | ICD-10-CM | POA: Diagnosis present

## 2018-10-19 DIAGNOSIS — Z79899 Other long term (current) drug therapy: Secondary | ICD-10-CM | POA: Insufficient documentation

## 2018-10-19 DIAGNOSIS — Z794 Long term (current) use of insulin: Secondary | ICD-10-CM | POA: Diagnosis not present

## 2018-10-19 DIAGNOSIS — F17200 Nicotine dependence, unspecified, uncomplicated: Secondary | ICD-10-CM | POA: Insufficient documentation

## 2018-10-19 DIAGNOSIS — E162 Hypoglycemia, unspecified: Secondary | ICD-10-CM

## 2018-10-19 LAB — CBC WITH DIFFERENTIAL/PLATELET
Abs Immature Granulocytes: 0.01 10*3/uL (ref 0.00–0.07)
Basophils Absolute: 0 10*3/uL (ref 0.0–0.1)
Basophils Relative: 1 %
Eosinophils Absolute: 0.4 10*3/uL (ref 0.0–0.5)
Eosinophils Relative: 8 %
HCT: 36.8 % (ref 36.0–46.0)
Hemoglobin: 11.8 g/dL — ABNORMAL LOW (ref 12.0–15.0)
Immature Granulocytes: 0 %
Lymphocytes Relative: 31 %
Lymphs Abs: 1.5 10*3/uL (ref 0.7–4.0)
MCH: 29.1 pg (ref 26.0–34.0)
MCHC: 32.1 g/dL (ref 30.0–36.0)
MCV: 90.9 fL (ref 80.0–100.0)
Monocytes Absolute: 0.3 10*3/uL (ref 0.1–1.0)
Monocytes Relative: 5 %
Neutro Abs: 2.7 10*3/uL (ref 1.7–7.7)
Neutrophils Relative %: 55 %
Platelets: 350 10*3/uL (ref 150–400)
RBC: 4.05 MIL/uL (ref 3.87–5.11)
RDW: 13.8 % (ref 11.5–15.5)
WBC: 5 10*3/uL (ref 4.0–10.5)
nRBC: 0 % (ref 0.0–0.2)

## 2018-10-19 LAB — BASIC METABOLIC PANEL
Anion gap: 10 (ref 5–15)
BUN: 27 mg/dL — ABNORMAL HIGH (ref 6–20)
CO2: 25 mmol/L (ref 22–32)
Calcium: 9 mg/dL (ref 8.9–10.3)
Chloride: 105 mmol/L (ref 98–111)
Creatinine, Ser: 0.93 mg/dL (ref 0.44–1.00)
GFR calc Af Amer: >60 mL/min (ref >60)
GFR calc non Af Amer: >60 mL/min (ref >60)
Glucose, Bld: 194 mg/dL — ABNORMAL HIGH (ref 70–99)
Potassium: 4.3 mmol/L (ref 3.5–5.1)
Sodium: 140 mmol/L (ref 135–145)

## 2018-10-19 NOTE — ED Provider Notes (Signed)
[facility] Emergency Department Provider Note   ____________________________________________   First MD Initiated Contact with Patient 10/19/18 1037     (approximate)  I have reviewed the triage vital signs and the nursing notes.   HISTORY  Chief Complaint Hypoglycemia    HPI [name] [name] is a 63 y.o. female who reports she was recently started on metformin.  Since then she has been having trouble with low blood sugar.  Her Lantus was dropped from 15-10 but she still having trouble with low blood sugar.  She is eating about the same amount of food every day.  She is exercising about the same amount every day.  She is not having any symptoms of illness.  Sugar went down to 40 earlier today.  She reports when her sugar goes down to about 150 she gets very tachycardic.  Her doctor is spoken with her about the need to be able to keep her sugar down around 150 and let her body get used to the sugar being around 150.  We just worried about going down to 40.      Past Medical History:  Diagnosis Date  . Diabetes mellitus without complication ([place])     There are no active problems to display for this patient.   History reviewed. No pertinent surgical history.  Prior to Admission medications   Medication Sig Start Date End Date Taking? Authorizing Provider  atorvastatin (LIPITOR) 20 MG tablet Take 20 mg by mouth daily.   Yes [provider]  insulin glargine (LANTUS) 100 UNIT/ML injection Please inject 16 units SQ each night Patient taking differently: Inject 15 Units into the skin daily. Please inject 16 units SQ each night 12/16/17 12/16/18 Yes Paduchowski, Lennette Bihari, MD  lisinopril (ZESTRIL) 5 MG tablet Take 5 mg by mouth daily.   Yes [provider]  metFORMIN (GLUCOPHAGE-XR) 500 MG 24 hr tablet Take 500 mg by mouth 2 (two) times daily.   Yes [provider]  metoprolol succinate (TOPROL-XL) 25 MG 24 hr tablet Take 25 mg by mouth  daily.   Yes [provider]    Allergies Patient has no known allergies.  No family history on file.  Social History Social History   Tobacco Use  . Smoking status: Current Every Day Smoker  . Smokeless tobacco: Never Used  Substance Use Topics  . Alcohol use: Never    Frequency: Never  . Drug use: Not Currently    Review of Systems  Constitutional: No fever/chills Eyes: No visual changes. ENT: No sore throat. Cardiovascular: Denies chest pain. Respiratory: Denies shortness of breath. Gastrointestinal: No abdominal pain.  No nausea, no vomiting.  No diarrhea.  No constipation. Genitourinary: Negative for dysuria. Musculoskeletal: Negative for back pain. Skin: Negative for rash. Neurological: Negative for headaches, focal weakness   ____________________________________________   PHYSICAL EXAM:  VITAL SIGNS: ED Triage Vitals  Enc Vitals Group     BP 10/19/18 1030 130/73     Pulse Rate 10/19/18 1030 75     Resp 10/19/18 1030 18     Temp 10/19/18 1030 98.2 F (36.8 C)     Temp Source 10/19/18 1030 Oral     SpO2 10/19/18 1030 100 %     Weight 10/19/18 1032 100 lb (45.4 kg)     Height 10/19/18 1032 5\' 5"  (1.651 m)     Head Circumference --      Peak Flow --      Pain Score 10/19/18 1032 0  Pain Loc --      Pain Edu? --      Excl. in [place]? --     Constitutional: Alert and oriented. Well appearing and in no acute distress. Eyes: Conjunctivae are normal.  Head: Atraumatic. Nose: No congestion/rhinnorhea. Mouth/Throat: Mucous membranes are moist.  Oropharynx non-erythematous. Neck: No stridor.   Cardiovascular: Normal rate, regular rhythm. Grossly normal heart sounds.  Good peripheral circulation. Respiratory: Normal respiratory effort.  No retractions. Lungs CTAB. Gastrointestinal: Soft and nontender. No distention. No abdominal bruits. No CVA tenderness. Musculoskeletal: No lower extremity tenderness nor edema.   Neurologic:  Normal speech and  language. No gross focal neurologic deficits are appreciated.  Skin:  Skin is warm, dry and intact. No rash noted.   ____________________________________________   LABS (all labs ordered are listed, but only abnormal results are displayed)  Labs Reviewed  CBC WITH DIFFERENTIAL/PLATELET - Abnormal; Notable for the following components:      Result Value   Hemoglobin 11.8 (*)    All other components within normal limits  BASIC METABOLIC PANEL - Abnormal; Notable for the following components:   Glucose, Bld 194 (*)    BUN 27 (*)    All other components within normal limits   ____________________________________________  EKG   ____________________________________________  RADIOLOGY  ED MD interpretation:   Official radiology report(s): No results found.  ____________________________________________   PROCEDURES  Procedure(s) performed (including Critical Care):  Procedures   ____________________________________________   INITIAL IMPRESSION / ASSESSMENT AND PLAN / ED COURSE  Patient with episodes of hypoglycemia continuing in spite of no change in diet or exercise.  We will decrease her insulin some more.  Her follow-up with her doctor.             ____________________________________________   FINAL CLINICAL IMPRESSION(S) / ED DIAGNOSES  Final diagnoses:  Hypoglycemia     ED Discharge Orders    None       Note:  This document was prepared using Dragon voice recognition software and may include unintentional dictation errors.    Nena Polio, MD 10/19/18 1122

## 2018-10-19 NOTE — Discharge Instructions (Addendum)
Please decrease your insulin by another 5 units a day.  Keep close track of your blood sugar.  Do not let it go much over 200.  If it begins to go up again increase your insulin back by 3 units.  Return here again if your sugar drops below 60.  Keep in close contact with your doctor.

## 2018-10-19 NOTE — ED Notes (Signed)
Pt c/o episodes of low blood sugar. Pt states that her medications were changed about 1 month ago, she had episodes of low blood sugar and called her doctor, insulin was decreased from 20 units to 15 units, pt states that she was told if her blood sugar continues to run low to decrease dose to 13 units. Pt is also taking 3 oral diabetic medications in addition to insulin. Pt reports episodes of blood sugar dropping into the 40's.

## 2018-10-19 NOTE — ED Triage Notes (Signed)
States has had episode of low blood sugar x 1 month since changed medicines. States has 2 episodes yesterday evening and 2 this am with blood sugar dropping to 40s. Now alert, skin warm and dry.

## 2018-11-26 DIAGNOSIS — I779 Disorder of arteries and arterioles, unspecified: Secondary | ICD-10-CM | POA: Insufficient documentation

## 2018-12-05 HISTORY — PX: OTHER SURGICAL HISTORY: SHX169

## 2019-01-06 DIAGNOSIS — G629 Polyneuropathy, unspecified: Secondary | ICD-10-CM | POA: Insufficient documentation

## 2019-04-02 DIAGNOSIS — N879 Dysplasia of cervix uteri, unspecified: Secondary | ICD-10-CM | POA: Insufficient documentation

## 2019-06-20 HISTORY — PX: OTHER SURGICAL HISTORY: SHX169

## 2019-08-01 ENCOUNTER — Ambulatory Visit (INDEPENDENT_AMBULATORY_CARE_PROVIDER_SITE_OTHER): Payer: Medicaid Other

## 2019-08-01 ENCOUNTER — Encounter: Payer: Self-pay | Admitting: Emergency Medicine

## 2019-08-01 ENCOUNTER — Ambulatory Visit
Admission: EM | Admit: 2019-08-01 | Discharge: 2019-08-01 | Disposition: A | Discharge status: Home / Self Care | Payer: Medicaid Other | Attending: Emergency Medicine | Admitting: Emergency Medicine

## 2019-08-01 ENCOUNTER — Other Ambulatory Visit: Payer: Self-pay

## 2019-08-01 DIAGNOSIS — E1165 Type 2 diabetes mellitus with hyperglycemia: Secondary | ICD-10-CM | POA: Insufficient documentation

## 2019-08-01 DIAGNOSIS — R609 Edema, unspecified: Secondary | ICD-10-CM | POA: Diagnosis not present

## 2019-08-01 DIAGNOSIS — M79671 Pain in right foot: Secondary | ICD-10-CM

## 2019-08-01 LAB — CBC WITH DIFFERENTIAL/PLATELET
Abs Immature Granulocytes: 0.02 10*3/uL (ref 0.00–0.07)
Basophils Absolute: 0 10*3/uL (ref 0.0–0.1)
Basophils Relative: 1 %
Eosinophils Absolute: 0.4 10*3/uL (ref 0.0–0.5)
Eosinophils Relative: 4 %
HCT: 34.8 % — ABNORMAL LOW (ref 36.0–46.0)
Hemoglobin: 11.3 g/dL — ABNORMAL LOW (ref 12.0–15.0)
Immature Granulocytes: 0 %
Lymphocytes Relative: 15 %
Lymphs Abs: 1.2 10*3/uL (ref 0.7–4.0)
MCH: 28.2 pg (ref 26.0–34.0)
MCHC: 32.5 g/dL (ref 30.0–36.0)
MCV: 86.8 fL (ref 80.0–100.0)
Monocytes Absolute: 0.4 10*3/uL (ref 0.1–1.0)
Monocytes Relative: 4 %
Neutro Abs: 6 10*3/uL (ref 1.7–7.7)
Neutrophils Relative %: 76 %
Platelets: 374 10*3/uL (ref 150–400)
RBC: 4.01 MIL/uL (ref 3.87–5.11)
RDW: 13.7 % (ref 11.5–15.5)
WBC: 7.9 10*3/uL (ref 4.0–10.5)
nRBC: 0 % (ref 0.0–0.2)

## 2019-08-01 LAB — COMPREHENSIVE METABOLIC PANEL
ALT: 33 U/L (ref 0–44)
AST: 33 U/L (ref 15–41)
Albumin: 2.6 g/dL — ABNORMAL LOW (ref 3.5–5.0)
Alkaline Phosphatase: 256 U/L — ABNORMAL HIGH (ref 38–126)
Anion gap: 9 (ref 5–15)
BUN: 22 mg/dL (ref 8–23)
CO2: 26 mmol/L (ref 22–32)
Calcium: 8.8 mg/dL — ABNORMAL LOW (ref 8.9–10.3)
Chloride: 101 mmol/L (ref 98–111)
Creatinine, Ser: 1.23 mg/dL — ABNORMAL HIGH (ref 0.44–1.00)
GFR calc Af Amer: 55 mL/min — ABNORMAL LOW (ref >60)
GFR calc non Af Amer: 47 mL/min — ABNORMAL LOW (ref >60)
Glucose, Bld: 546 mg/dL (ref 70–99)
Potassium: 4.6 mmol/L (ref 3.5–5.1)
Sodium: 136 mmol/L (ref 135–145)
Total Bilirubin: 0.4 mg/dL (ref 0.3–1.2)
Total Protein: 6.7 g/dL (ref 6.5–8.1)

## 2019-08-01 NOTE — ED Triage Notes (Signed)
Patient c/o swelling in both feet off and on for 3 weeks.  Patient states that the swelling was worse yesterday.  Patient c/o pain across the top of her feet.  Patient denies injury or fall.  Patient states that she has not taken anything for her pain.

## 2019-08-01 NOTE — ED Provider Notes (Signed)
[facility]    CSN: [id number] Arrival date & time: 08/01/19  1142      History   Chief Complaint Chief Complaint  Patient presents with  . Foot Swelling    bilateral    HPI [name] [name] is a 64 y.o. female.   HPI  64 year old female presents with swelling in both of her feet on and off for the past 3 weeks.  He said her swelling seemed to be worse yesterday.  She is complaining of pain across the top of her feet particularly over the great toe areas.  It is worse on the right than the left.  She has had no injury or fall.  She does work at [employer] where she stands on her feet for long periods of time without breaks.  Does not take anything for her pain.  She has a history of diabetes mellitus and peripheral neuropathy in her feet.  Denies any kidney problems.  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  She has a Wells criteria score of 2.  She is on insulin but has not been using it at nighttime per tickly her basal insulin injections.  Her last A1c was in the nines.  She does not appear to be very compliant with her sugar.  She is not plain of polyuria polyphagia or polydipsia.  She is very thin.  She has had no nausea or vomiting or weakness.  Vital signs temperature 98.3 pulse rate of 99 blood pressure 140/88 respirations 14 O2 sats 100%.  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  This is from a note from 12/30/2018.       Past Medical History:  Diagnosis Date  . Diabetes mellitus without complication ([place])     There are no problems to display for this patient.   History reviewed. No pertinent surgical history.  OB History   No obstetric history on file.      Home Medications    Prior to Admission medications   Medication Sig Start Date End Date Taking? Authorizing Provider  atorvastatin (LIPITOR) 20 MG tablet Take 20 mg by mouth daily.    Yes [provider]  gabapentin (NEURONTIN) 300 MG capsule Take by mouth. 02/19/19  Yes [provider]  insulin glargine (LANTUS) 100 UNIT/ML injection Please inject 16 units SQ each night Patient taking differently: Inject 15 Units into the skin daily. Please inject 16 units SQ each night 12/16/17 08/01/19 Yes Paduchowski, Lennette Bihari, MD  lisinopril (ZESTRIL) 5 MG tablet Take 5 mg by mouth daily.   Yes [provider]  metFORMIN (GLUCOPHAGE-XR) 500 MG 24 hr tablet Take 500 mg by mouth 2 (two) times daily.   Yes [provider]  metoprolol succinate (TOPROL-XL) 25 MG 24 hr tablet Take 25 mg by mouth daily.   Yes [provider]  aspirin 81 MG chewable tablet Chew by mouth.    [provider]    Family History Family History  Problem Relation Age of Onset  . Heart disease Mother   . Diabetes Mother   . Heart disease Father   . Diabetes Father     Social History Social History   Tobacco Use  . Smoking status: Current Every Day Smoker    Types: Cigars  . Smokeless tobacco: Never Used  Substance Use Topics  . Alcohol use: Never  . Drug use: Not Currently  Allergies   Patient has no known allergies.   Review of Systems Review of Systems  Constitutional: Positive for activity change. Negative for appetite change, chills, diaphoresis, fatigue and fever.  Musculoskeletal: Positive for arthralgias.  All other systems reviewed and are negative.    Physical Exam Triage Vital Signs ED Triage Vitals  Enc Vitals Group     BP 08/01/19 1241 140/88     Pulse Rate 08/01/19 1241 99     Resp 08/01/19 1241 14     Temp 08/01/19 1241 98.3 F (36.8 C)     Temp Source 08/01/19 1241 Oral     SpO2 08/01/19 1241 100 %     Weight 08/01/19 1237 105 lb (47.6 kg)     Height 08/01/19 1237 5\' 5"  (1.651 m)     Head Circumference --      Peak Flow --      Pain Score 08/01/19 1236 8     Pain Loc --      Pain Edu? --      Excl. in [place]? --     No data found.  Updated Vital Signs BP 140/88 (BP Location: Right Arm)   Pulse 99   Temp 98.3 F (36.8 C) (Oral)   Resp 14   Ht 5\' 5"  (1.651 m)   Wt 105 lb (47.6 kg)   SpO2 100%   BMI 17.47 kg/m   Visual Acuity Right Eye Distance:   Left Eye Distance:   Bilateral Distance:    Right Eye Near:   Left Eye Near:    Bilateral Near:     Physical Exam Vitals and nursing note reviewed.  Constitutional:      General: She is not in acute distress.    Appearance: Normal appearance. She is normal weight. She is not ill-appearing, toxic-appearing or diaphoretic.  HENT:     Head: Normocephalic and atraumatic.     Nose: Nose normal.  Eyes:     Conjunctiva/sclera: Conjunctivae normal.  Cardiovascular:     Rate and Rhythm: Normal rate and regular rhythm.     Heart sounds: Normal heart sounds.  Pulmonary:     Effort: Pulmonary effort is normal.     Breath sounds: Normal breath sounds.  Musculoskeletal:        General: Swelling and tenderness present. Normal range of motion.     Cervical back: Normal range of motion and neck supple.     Comments: Examination of the lower extremities shows a definite swelling of the right lower extremity to the level of her knee.  No warmth tenderness of the calf.  Measurement of the calf shows it to be enlarged by 1 cm over the left.  She does have pitting edema which is much worse on the right but is present to a lesser degree on the left.  She has barely palpable pulses of the dorsalis pedis bilaterally.  She has good range of motion of the ankle.  She has no tenderness of the lateral foot but is very tender over the first MTP.  This is mildly warm and slightly swollen.  Left leg shows similar findings but to a very much less degree.  Calf circumference on the right was 29-1/2 cm on the left was 28 1/2 cm  Skin:    General: Skin is warm and dry.  Neurological:     General: No focal deficit present.     Mental Status: She is alert and oriented to  person, place, and time.  Psychiatric:        Mood and Affect: Mood normal.        Behavior: Behavior normal.        Thought Content: Thought content normal.        Judgment: Judgment normal.      UC Treatments / Results  Labs (all labs ordered are listed, but only abnormal results are displayed) Labs Reviewed  CBC WITH DIFFERENTIAL/PLATELET - Abnormal; Notable for the following components:      Result Value   Hemoglobin 11.3 (*)    HCT 34.8 (*)    All other components within normal limits  COMPREHENSIVE METABOLIC PANEL - Abnormal; Notable for the following components:   Glucose, Bld 546 (*)    Creatinine, Ser 1.23 (*)    Calcium 8.8 (*)    Albumin 2.6 (*)    Alkaline Phosphatase 256 (*)    GFR calc non Af Amer 47 (*)    GFR calc Af Amer 55 (*)    All other components within normal limits    EKG   Radiology DG Foot Complete Right  Result Date: 08/01/2019 CLINICAL DATA:  Dorsal pain. Swelling of both feet for 3 weeks. History of diabetes. EXAM: RIGHT FOOT COMPLETE - 3+ VIEW COMPARISON:  None. FINDINGS: There is a mild hallux valgus deformity. No fractures are identified. Mild degenerative changes at the first MTP joint. No bony erosion identified. Vascular calcifications are noted. IMPRESSION: No acute abnormalities.  Vascular calcifications. Electronically Signed   By: Dorise Bullion III M.D   On: 08/01/2019 14:44    Procedures Procedures (including critical care time)  Medications Ordered in UC Medications - No data to display  Initial Impression / Assessment and Plan / UC Course  I have reviewed the triage vital signs and the nursing notes.  Pertinent labs & imaging results that were available during my care of the patient were reviewed by me and considered in my medical decision making (see chart for details).   I reviewed the x-rays with the patient showing no acute abnormalities.  She did have vascular calcifications present.  She had mild first MTP joint  degenerative changes.  There is no bony erosions.  Laboratory results shows a glucose of 546.  Creatinine was 1.23 alkaline phosphatase was 256 albumin 2.6 .  CBC white count 7.9 hemoglobin hematocrit 11.3 34.8 respectively not much change from 10/19/2018.  I have encouraged the patient to go to the emergency room but she refused to flatly.  She stated that she could go home and use her insulin, which she has not used in several days, to decrease her blood glucose.  Bite report dated pleading for her to go the emergency room she again refused.  I have asked her to drink plenty of fluids.  Is to follow her blood sugars much more carefully.  She has an appointment with her primary care on March 20 but I told her that she should move this up as soon as possible.  He is to elevate her legs sufficiently to control swelling.  She may also consider using compressive hose when she is at work standing for prolonged periods.  If she worsens she promised me that she will go to the emergency room. Final Clinical Impressions(s) / UC Diagnoses   Final diagnoses:  Peripheral edema  Hyperglycemia due to diabetes mellitus [facility])     Discharge Instructions     Drink plenty of fluids.  Certain to take your insulin as is prescribed.  If you feel worse go immediately to the emergency room. You Refused to go today and stated you would take your insulin when he got home please be sure to monitor it correctly.  Elevate your legs above the level of your heart as we discussed to help with the swelling.  I recommend that you follow-up with your primary care physician sooner than your March 20 appointment.    ED Prescriptions    None     PDMP not reviewed this encounter.   Lorin Picket, PA-C 08/01/19 1730

## 2019-08-01 NOTE — Discharge Instructions (Addendum)
Drink plenty of fluids.  Certain to take your insulin as is prescribed.  If you feel worse go immediately to the emergency room. You Refused to go today and stated you would take your insulin when he got home please be sure to monitor it correctly.  Elevate your legs above the level of your heart as we discussed to help with the swelling.  I recommend that you follow-up with your primary care physician sooner than your March 20 appointment.

## 2019-08-17 DIAGNOSIS — E11628 Type 2 diabetes mellitus with other skin complications: Secondary | ICD-10-CM | POA: Diagnosis not present

## 2019-08-17 DIAGNOSIS — F17201 Nicotine dependence, unspecified, in remission: Secondary | ICD-10-CM | POA: Insufficient documentation

## 2019-08-17 DIAGNOSIS — F1490 Cocaine use, unspecified, uncomplicated: Secondary | ICD-10-CM | POA: Insufficient documentation

## 2019-08-17 DIAGNOSIS — R531 Weakness: Secondary | ICD-10-CM | POA: Diagnosis not present

## 2019-08-17 DIAGNOSIS — R609 Edema, unspecified: Secondary | ICD-10-CM | POA: Diagnosis not present

## 2019-08-17 DIAGNOSIS — Z794 Long term (current) use of insulin: Secondary | ICD-10-CM | POA: Diagnosis not present

## 2019-08-17 DIAGNOSIS — F17200 Nicotine dependence, unspecified, uncomplicated: Secondary | ICD-10-CM | POA: Insufficient documentation

## 2019-08-17 DIAGNOSIS — M7989 Other specified soft tissue disorders: Secondary | ICD-10-CM | POA: Diagnosis not present

## 2019-08-17 DIAGNOSIS — I739 Peripheral vascular disease, unspecified: Secondary | ICD-10-CM | POA: Insufficient documentation

## 2019-08-17 DIAGNOSIS — Z20822 Contact with and (suspected) exposure to covid-19: Secondary | ICD-10-CM | POA: Diagnosis not present

## 2019-08-17 DIAGNOSIS — R52 Pain, unspecified: Secondary | ICD-10-CM | POA: Diagnosis not present

## 2019-08-17 DIAGNOSIS — M6250 Muscle wasting and atrophy, not elsewhere classified, unspecified site: Secondary | ICD-10-CM | POA: Diagnosis not present

## 2019-08-17 DIAGNOSIS — M799 Soft tissue disorder, unspecified: Secondary | ICD-10-CM | POA: Diagnosis not present

## 2019-08-17 DIAGNOSIS — L0390 Cellulitis, unspecified: Secondary | ICD-10-CM | POA: Insufficient documentation

## 2019-08-17 DIAGNOSIS — L089 Local infection of the skin and subcutaneous tissue, unspecified: Secondary | ICD-10-CM | POA: Diagnosis not present

## 2019-08-17 DIAGNOSIS — S91301A Unspecified open wound, right foot, initial encounter: Secondary | ICD-10-CM | POA: Diagnosis not present

## 2019-08-17 DIAGNOSIS — Z681 Body mass index (BMI) 19 or less, adult: Secondary | ICD-10-CM | POA: Diagnosis not present

## 2019-08-17 DIAGNOSIS — E11621 Type 2 diabetes mellitus with foot ulcer: Secondary | ICD-10-CM | POA: Diagnosis not present

## 2019-08-18 DIAGNOSIS — I739 Peripheral vascular disease, unspecified: Secondary | ICD-10-CM | POA: Diagnosis not present

## 2019-09-30 DIAGNOSIS — L97519 Non-pressure chronic ulcer of other part of right foot with unspecified severity: Secondary | ICD-10-CM | POA: Diagnosis not present

## 2019-09-30 DIAGNOSIS — R7401 Elevation of levels of liver transaminase levels: Secondary | ICD-10-CM | POA: Diagnosis not present

## 2019-09-30 DIAGNOSIS — R600 Localized edema: Secondary | ICD-10-CM | POA: Diagnosis not present

## 2019-09-30 DIAGNOSIS — N179 Acute kidney failure, unspecified: Secondary | ICD-10-CM | POA: Diagnosis not present

## 2019-09-30 DIAGNOSIS — Z20822 Contact with and (suspected) exposure to covid-19: Secondary | ICD-10-CM | POA: Diagnosis not present

## 2019-10-01 DIAGNOSIS — R748 Abnormal levels of other serum enzymes: Secondary | ICD-10-CM | POA: Diagnosis not present

## 2019-10-01 DIAGNOSIS — R945 Abnormal results of liver function studies: Secondary | ICD-10-CM | POA: Diagnosis not present

## 2019-10-01 DIAGNOSIS — L03115 Cellulitis of right lower limb: Secondary | ICD-10-CM | POA: Diagnosis not present

## 2019-10-01 DIAGNOSIS — R9341 Abnormal radiologic findings on diagnostic imaging of renal pelvis, ureter, or bladder: Secondary | ICD-10-CM | POA: Diagnosis not present

## 2019-10-01 DIAGNOSIS — N179 Acute kidney failure, unspecified: Secondary | ICD-10-CM | POA: Diagnosis not present

## 2019-10-01 DIAGNOSIS — R609 Edema, unspecified: Secondary | ICD-10-CM | POA: Diagnosis not present

## 2019-10-01 DIAGNOSIS — E11621 Type 2 diabetes mellitus with foot ulcer: Secondary | ICD-10-CM | POA: Diagnosis not present

## 2019-10-01 DIAGNOSIS — Z9049 Acquired absence of other specified parts of digestive tract: Secondary | ICD-10-CM | POA: Diagnosis not present

## 2019-10-01 DIAGNOSIS — E1169 Type 2 diabetes mellitus with other specified complication: Secondary | ICD-10-CM | POA: Diagnosis not present

## 2019-10-01 DIAGNOSIS — R7401 Elevation of levels of liver transaminase levels: Secondary | ICD-10-CM | POA: Diagnosis not present

## 2019-10-02 DIAGNOSIS — N179 Acute kidney failure, unspecified: Secondary | ICD-10-CM | POA: Diagnosis not present

## 2019-10-02 DIAGNOSIS — E11621 Type 2 diabetes mellitus with foot ulcer: Secondary | ICD-10-CM | POA: Diagnosis not present

## 2019-10-02 DIAGNOSIS — L97401 Non-pressure chronic ulcer of unspecified heel and midfoot limited to breakdown of skin: Secondary | ICD-10-CM | POA: Diagnosis not present

## 2019-10-02 DIAGNOSIS — R748 Abnormal levels of other serum enzymes: Secondary | ICD-10-CM | POA: Diagnosis not present

## 2019-10-02 DIAGNOSIS — R7401 Elevation of levels of liver transaminase levels: Secondary | ICD-10-CM | POA: Diagnosis not present

## 2019-10-03 DIAGNOSIS — E11621 Type 2 diabetes mellitus with foot ulcer: Secondary | ICD-10-CM | POA: Insufficient documentation

## 2019-10-03 DIAGNOSIS — N179 Acute kidney failure, unspecified: Secondary | ICD-10-CM | POA: Diagnosis not present

## 2019-10-03 DIAGNOSIS — L97401 Non-pressure chronic ulcer of unspecified heel and midfoot limited to breakdown of skin: Secondary | ICD-10-CM | POA: Diagnosis not present

## 2019-10-03 DIAGNOSIS — R748 Abnormal levels of other serum enzymes: Secondary | ICD-10-CM | POA: Diagnosis not present

## 2019-10-03 DIAGNOSIS — R7401 Elevation of levels of liver transaminase levels: Secondary | ICD-10-CM | POA: Diagnosis not present

## 2019-10-03 HISTORY — DX: Type 2 diabetes mellitus with foot ulcer: E11.621

## 2019-10-16 DIAGNOSIS — N181 Chronic kidney disease, stage 1: Secondary | ICD-10-CM | POA: Diagnosis not present

## 2019-10-16 DIAGNOSIS — N179 Acute kidney failure, unspecified: Secondary | ICD-10-CM | POA: Diagnosis not present

## 2019-10-16 DIAGNOSIS — R809 Proteinuria, unspecified: Secondary | ICD-10-CM | POA: Diagnosis not present

## 2019-10-16 DIAGNOSIS — E8809 Other disorders of plasma-protein metabolism, not elsewhere classified: Secondary | ICD-10-CM | POA: Diagnosis not present

## 2019-10-16 DIAGNOSIS — Z681 Body mass index (BMI) 19 or less, adult: Secondary | ICD-10-CM | POA: Diagnosis not present

## 2019-10-16 DIAGNOSIS — I150 Renovascular hypertension: Secondary | ICD-10-CM | POA: Diagnosis not present

## 2019-11-23 DIAGNOSIS — L97519 Non-pressure chronic ulcer of other part of right foot with unspecified severity: Secondary | ICD-10-CM | POA: Diagnosis not present

## 2019-11-23 DIAGNOSIS — L03115 Cellulitis of right lower limb: Secondary | ICD-10-CM | POA: Diagnosis not present

## 2019-11-23 DIAGNOSIS — M79661 Pain in right lower leg: Secondary | ICD-10-CM | POA: Diagnosis not present

## 2019-11-23 DIAGNOSIS — Z681 Body mass index (BMI) 19 or less, adult: Secondary | ICD-10-CM | POA: Diagnosis not present

## 2019-11-23 DIAGNOSIS — Z20822 Contact with and (suspected) exposure to covid-19: Secondary | ICD-10-CM | POA: Diagnosis not present

## 2019-11-23 DIAGNOSIS — E10621 Type 1 diabetes mellitus with foot ulcer: Secondary | ICD-10-CM | POA: Diagnosis not present

## 2019-11-24 DIAGNOSIS — M7989 Other specified soft tissue disorders: Secondary | ICD-10-CM | POA: Diagnosis not present

## 2019-11-24 DIAGNOSIS — E119 Type 2 diabetes mellitus without complications: Secondary | ICD-10-CM | POA: Diagnosis not present

## 2019-11-24 DIAGNOSIS — L089 Local infection of the skin and subcutaneous tissue, unspecified: Secondary | ICD-10-CM | POA: Diagnosis not present

## 2019-11-24 DIAGNOSIS — L97519 Non-pressure chronic ulcer of other part of right foot with unspecified severity: Secondary | ICD-10-CM | POA: Diagnosis not present

## 2019-11-24 DIAGNOSIS — I739 Peripheral vascular disease, unspecified: Secondary | ICD-10-CM | POA: Diagnosis not present

## 2019-11-24 DIAGNOSIS — R600 Localized edema: Secondary | ICD-10-CM | POA: Diagnosis not present

## 2019-11-24 DIAGNOSIS — M79671 Pain in right foot: Secondary | ICD-10-CM | POA: Diagnosis not present

## 2019-11-24 DIAGNOSIS — I10 Essential (primary) hypertension: Secondary | ICD-10-CM | POA: Diagnosis not present

## 2019-11-26 DIAGNOSIS — S199XXA Unspecified injury of neck, initial encounter: Secondary | ICD-10-CM | POA: Diagnosis not present

## 2019-11-26 DIAGNOSIS — S0993XA Unspecified injury of face, initial encounter: Secondary | ICD-10-CM | POA: Diagnosis not present

## 2019-11-26 DIAGNOSIS — E1165 Type 2 diabetes mellitus with hyperglycemia: Secondary | ICD-10-CM | POA: Diagnosis not present

## 2019-11-26 DIAGNOSIS — S0990XA Unspecified injury of head, initial encounter: Secondary | ICD-10-CM | POA: Diagnosis not present

## 2019-11-26 DIAGNOSIS — M50322 Other cervical disc degeneration at C5-C6 level: Secondary | ICD-10-CM | POA: Diagnosis not present

## 2019-11-26 DIAGNOSIS — Z794 Long term (current) use of insulin: Secondary | ICD-10-CM | POA: Diagnosis not present

## 2019-11-27 DIAGNOSIS — M7989 Other specified soft tissue disorders: Secondary | ICD-10-CM | POA: Diagnosis not present

## 2019-11-27 DIAGNOSIS — I340 Nonrheumatic mitral (valve) insufficiency: Secondary | ICD-10-CM | POA: Diagnosis not present

## 2019-11-27 DIAGNOSIS — E10621 Type 1 diabetes mellitus with foot ulcer: Secondary | ICD-10-CM | POA: Diagnosis not present

## 2019-11-27 DIAGNOSIS — D259 Leiomyoma of uterus, unspecified: Secondary | ICD-10-CM | POA: Diagnosis not present

## 2019-11-27 DIAGNOSIS — I361 Nonrheumatic tricuspid (valve) insufficiency: Secondary | ICD-10-CM | POA: Diagnosis not present

## 2019-11-27 DIAGNOSIS — I313 Pericardial effusion (noninflammatory): Secondary | ICD-10-CM | POA: Diagnosis not present

## 2019-11-27 DIAGNOSIS — I371 Nonrheumatic pulmonary valve insufficiency: Secondary | ICD-10-CM | POA: Diagnosis not present

## 2019-11-27 DIAGNOSIS — Z794 Long term (current) use of insulin: Secondary | ICD-10-CM | POA: Diagnosis not present

## 2019-11-27 DIAGNOSIS — I272 Pulmonary hypertension, unspecified: Secondary | ICD-10-CM | POA: Diagnosis not present

## 2019-11-27 DIAGNOSIS — E119 Type 2 diabetes mellitus without complications: Secondary | ICD-10-CM | POA: Diagnosis not present

## 2019-11-27 DIAGNOSIS — L97519 Non-pressure chronic ulcer of other part of right foot with unspecified severity: Secondary | ICD-10-CM | POA: Diagnosis not present

## 2019-11-27 DIAGNOSIS — J9811 Atelectasis: Secondary | ICD-10-CM | POA: Diagnosis not present

## 2019-11-28 DIAGNOSIS — E1165 Type 2 diabetes mellitus with hyperglycemia: Secondary | ICD-10-CM | POA: Diagnosis not present

## 2019-11-28 DIAGNOSIS — Z794 Long term (current) use of insulin: Secondary | ICD-10-CM | POA: Diagnosis not present

## 2019-12-04 DIAGNOSIS — Z20822 Contact with and (suspected) exposure to covid-19: Secondary | ICD-10-CM | POA: Diagnosis not present

## 2019-12-04 DIAGNOSIS — Z681 Body mass index (BMI) 19 or less, adult: Secondary | ICD-10-CM | POA: Diagnosis not present

## 2019-12-04 DIAGNOSIS — S92901A Unspecified fracture of right foot, initial encounter for closed fracture: Secondary | ICD-10-CM | POA: Diagnosis not present

## 2019-12-04 DIAGNOSIS — E11621 Type 2 diabetes mellitus with foot ulcer: Secondary | ICD-10-CM | POA: Diagnosis not present

## 2019-12-04 DIAGNOSIS — L97519 Non-pressure chronic ulcer of other part of right foot with unspecified severity: Secondary | ICD-10-CM | POA: Diagnosis not present

## 2019-12-05 DIAGNOSIS — I10 Essential (primary) hypertension: Secondary | ICD-10-CM | POA: Diagnosis not present

## 2019-12-05 DIAGNOSIS — S91301A Unspecified open wound, right foot, initial encounter: Secondary | ICD-10-CM | POA: Diagnosis not present

## 2019-12-05 DIAGNOSIS — E119 Type 2 diabetes mellitus without complications: Secondary | ICD-10-CM | POA: Diagnosis not present

## 2019-12-05 DIAGNOSIS — Z794 Long term (current) use of insulin: Secondary | ICD-10-CM | POA: Diagnosis not present

## 2019-12-05 DIAGNOSIS — E1165 Type 2 diabetes mellitus with hyperglycemia: Secondary | ICD-10-CM | POA: Diagnosis not present

## 2019-12-06 DIAGNOSIS — I739 Peripheral vascular disease, unspecified: Secondary | ICD-10-CM | POA: Diagnosis not present

## 2019-12-06 DIAGNOSIS — I10 Essential (primary) hypertension: Secondary | ICD-10-CM | POA: Diagnosis not present

## 2019-12-06 DIAGNOSIS — Z794 Long term (current) use of insulin: Secondary | ICD-10-CM | POA: Diagnosis not present

## 2019-12-06 DIAGNOSIS — S91301A Unspecified open wound, right foot, initial encounter: Secondary | ICD-10-CM | POA: Diagnosis not present

## 2019-12-06 DIAGNOSIS — E11649 Type 2 diabetes mellitus with hypoglycemia without coma: Secondary | ICD-10-CM | POA: Diagnosis not present

## 2019-12-06 DIAGNOSIS — E119 Type 2 diabetes mellitus without complications: Secondary | ICD-10-CM | POA: Diagnosis not present

## 2019-12-07 DIAGNOSIS — S91301A Unspecified open wound, right foot, initial encounter: Secondary | ICD-10-CM | POA: Diagnosis not present

## 2019-12-07 DIAGNOSIS — I739 Peripheral vascular disease, unspecified: Secondary | ICD-10-CM | POA: Diagnosis not present

## 2019-12-07 DIAGNOSIS — E119 Type 2 diabetes mellitus without complications: Secondary | ICD-10-CM | POA: Diagnosis not present

## 2019-12-07 DIAGNOSIS — I10 Essential (primary) hypertension: Secondary | ICD-10-CM | POA: Diagnosis not present

## 2019-12-08 DIAGNOSIS — M869 Osteomyelitis, unspecified: Secondary | ICD-10-CM | POA: Diagnosis not present

## 2019-12-08 DIAGNOSIS — L02611 Cutaneous abscess of right foot: Secondary | ICD-10-CM | POA: Diagnosis not present

## 2019-12-08 DIAGNOSIS — I10 Essential (primary) hypertension: Secondary | ICD-10-CM | POA: Diagnosis not present

## 2019-12-08 DIAGNOSIS — Z89411 Acquired absence of right great toe: Secondary | ICD-10-CM | POA: Diagnosis not present

## 2019-12-08 DIAGNOSIS — E119 Type 2 diabetes mellitus without complications: Secondary | ICD-10-CM | POA: Diagnosis not present

## 2019-12-08 DIAGNOSIS — Z794 Long term (current) use of insulin: Secondary | ICD-10-CM | POA: Diagnosis not present

## 2019-12-08 DIAGNOSIS — S91301A Unspecified open wound, right foot, initial encounter: Secondary | ICD-10-CM | POA: Diagnosis not present

## 2019-12-08 DIAGNOSIS — I96 Gangrene, not elsewhere classified: Secondary | ICD-10-CM | POA: Diagnosis not present

## 2019-12-08 DIAGNOSIS — I739 Peripheral vascular disease, unspecified: Secondary | ICD-10-CM | POA: Diagnosis not present

## 2019-12-08 DIAGNOSIS — E118 Type 2 diabetes mellitus with unspecified complications: Secondary | ICD-10-CM | POA: Diagnosis not present

## 2019-12-09 DIAGNOSIS — E1165 Type 2 diabetes mellitus with hyperglycemia: Secondary | ICD-10-CM | POA: Diagnosis not present

## 2019-12-09 DIAGNOSIS — E1152 Type 2 diabetes mellitus with diabetic peripheral angiopathy with gangrene: Secondary | ICD-10-CM | POA: Diagnosis not present

## 2019-12-09 DIAGNOSIS — M869 Osteomyelitis, unspecified: Secondary | ICD-10-CM | POA: Diagnosis not present

## 2019-12-09 DIAGNOSIS — Z794 Long term (current) use of insulin: Secondary | ICD-10-CM | POA: Diagnosis not present

## 2019-12-10 DIAGNOSIS — I10 Essential (primary) hypertension: Secondary | ICD-10-CM | POA: Diagnosis not present

## 2019-12-10 DIAGNOSIS — S91301A Unspecified open wound, right foot, initial encounter: Secondary | ICD-10-CM | POA: Diagnosis not present

## 2019-12-10 DIAGNOSIS — E1165 Type 2 diabetes mellitus with hyperglycemia: Secondary | ICD-10-CM | POA: Diagnosis not present

## 2019-12-10 DIAGNOSIS — E11649 Type 2 diabetes mellitus with hypoglycemia without coma: Secondary | ICD-10-CM | POA: Diagnosis not present

## 2019-12-10 DIAGNOSIS — Z794 Long term (current) use of insulin: Secondary | ICD-10-CM | POA: Diagnosis not present

## 2019-12-10 DIAGNOSIS — E119 Type 2 diabetes mellitus without complications: Secondary | ICD-10-CM | POA: Diagnosis not present

## 2019-12-11 DIAGNOSIS — E1165 Type 2 diabetes mellitus with hyperglycemia: Secondary | ICD-10-CM | POA: Diagnosis not present

## 2019-12-11 DIAGNOSIS — Z794 Long term (current) use of insulin: Secondary | ICD-10-CM | POA: Diagnosis not present

## 2019-12-12 DIAGNOSIS — E1165 Type 2 diabetes mellitus with hyperglycemia: Secondary | ICD-10-CM | POA: Diagnosis not present

## 2019-12-12 DIAGNOSIS — Z794 Long term (current) use of insulin: Secondary | ICD-10-CM | POA: Diagnosis not present

## 2019-12-12 DIAGNOSIS — E11649 Type 2 diabetes mellitus with hypoglycemia without coma: Secondary | ICD-10-CM | POA: Diagnosis not present

## 2019-12-16 DIAGNOSIS — E1169 Type 2 diabetes mellitus with other specified complication: Secondary | ICD-10-CM | POA: Diagnosis not present

## 2019-12-16 DIAGNOSIS — E11621 Type 2 diabetes mellitus with foot ulcer: Secondary | ICD-10-CM | POA: Diagnosis not present

## 2019-12-16 DIAGNOSIS — L97509 Non-pressure chronic ulcer of other part of unspecified foot with unspecified severity: Secondary | ICD-10-CM | POA: Diagnosis not present

## 2019-12-16 DIAGNOSIS — M869 Osteomyelitis, unspecified: Secondary | ICD-10-CM | POA: Diagnosis not present

## 2019-12-18 DIAGNOSIS — Z681 Body mass index (BMI) 19 or less, adult: Secondary | ICD-10-CM | POA: Diagnosis not present

## 2019-12-18 DIAGNOSIS — Z20822 Contact with and (suspected) exposure to covid-19: Secondary | ICD-10-CM | POA: Diagnosis not present

## 2019-12-18 DIAGNOSIS — R110 Nausea: Secondary | ICD-10-CM | POA: Diagnosis not present

## 2019-12-18 DIAGNOSIS — E119 Type 2 diabetes mellitus without complications: Secondary | ICD-10-CM | POA: Diagnosis not present

## 2019-12-18 DIAGNOSIS — R197 Diarrhea, unspecified: Secondary | ICD-10-CM | POA: Diagnosis not present

## 2019-12-18 DIAGNOSIS — K6289 Other specified diseases of anus and rectum: Secondary | ICD-10-CM | POA: Diagnosis not present

## 2019-12-18 DIAGNOSIS — D509 Iron deficiency anemia, unspecified: Secondary | ICD-10-CM | POA: Insufficient documentation

## 2019-12-18 DIAGNOSIS — I10 Essential (primary) hypertension: Secondary | ICD-10-CM | POA: Diagnosis not present

## 2019-12-18 DIAGNOSIS — E785 Hyperlipidemia, unspecified: Secondary | ICD-10-CM | POA: Diagnosis not present

## 2019-12-18 DIAGNOSIS — M79671 Pain in right foot: Secondary | ICD-10-CM | POA: Diagnosis not present

## 2019-12-19 DIAGNOSIS — D509 Iron deficiency anemia, unspecified: Secondary | ICD-10-CM | POA: Diagnosis not present

## 2019-12-19 DIAGNOSIS — L97513 Non-pressure chronic ulcer of other part of right foot with necrosis of muscle: Secondary | ICD-10-CM | POA: Diagnosis not present

## 2019-12-19 DIAGNOSIS — I10 Essential (primary) hypertension: Secondary | ICD-10-CM | POA: Diagnosis not present

## 2019-12-19 DIAGNOSIS — E785 Hyperlipidemia, unspecified: Secondary | ICD-10-CM | POA: Diagnosis not present

## 2019-12-19 DIAGNOSIS — E119 Type 2 diabetes mellitus without complications: Secondary | ICD-10-CM | POA: Diagnosis not present

## 2019-12-20 DIAGNOSIS — E785 Hyperlipidemia, unspecified: Secondary | ICD-10-CM | POA: Diagnosis not present

## 2019-12-20 DIAGNOSIS — L97513 Non-pressure chronic ulcer of other part of right foot with necrosis of muscle: Secondary | ICD-10-CM | POA: Diagnosis not present

## 2019-12-20 DIAGNOSIS — D509 Iron deficiency anemia, unspecified: Secondary | ICD-10-CM | POA: Diagnosis not present

## 2019-12-20 DIAGNOSIS — E119 Type 2 diabetes mellitus without complications: Secondary | ICD-10-CM | POA: Diagnosis not present

## 2019-12-20 DIAGNOSIS — I10 Essential (primary) hypertension: Secondary | ICD-10-CM | POA: Diagnosis not present

## 2019-12-21 DIAGNOSIS — E119 Type 2 diabetes mellitus without complications: Secondary | ICD-10-CM | POA: Diagnosis not present

## 2019-12-21 DIAGNOSIS — I10 Essential (primary) hypertension: Secondary | ICD-10-CM | POA: Diagnosis not present

## 2019-12-21 DIAGNOSIS — L97513 Non-pressure chronic ulcer of other part of right foot with necrosis of muscle: Secondary | ICD-10-CM | POA: Diagnosis not present

## 2019-12-21 DIAGNOSIS — D509 Iron deficiency anemia, unspecified: Secondary | ICD-10-CM | POA: Diagnosis not present

## 2019-12-21 DIAGNOSIS — E785 Hyperlipidemia, unspecified: Secondary | ICD-10-CM | POA: Diagnosis not present

## 2019-12-22 DIAGNOSIS — E119 Type 2 diabetes mellitus without complications: Secondary | ICD-10-CM | POA: Diagnosis not present

## 2019-12-22 DIAGNOSIS — D509 Iron deficiency anemia, unspecified: Secondary | ICD-10-CM | POA: Diagnosis not present

## 2019-12-22 DIAGNOSIS — I10 Essential (primary) hypertension: Secondary | ICD-10-CM | POA: Diagnosis not present

## 2019-12-22 DIAGNOSIS — E785 Hyperlipidemia, unspecified: Secondary | ICD-10-CM | POA: Diagnosis not present

## 2019-12-22 DIAGNOSIS — L97513 Non-pressure chronic ulcer of other part of right foot with necrosis of muscle: Secondary | ICD-10-CM | POA: Diagnosis not present

## 2019-12-23 DIAGNOSIS — E119 Type 2 diabetes mellitus without complications: Secondary | ICD-10-CM | POA: Diagnosis not present

## 2019-12-23 DIAGNOSIS — I10 Essential (primary) hypertension: Secondary | ICD-10-CM | POA: Diagnosis not present

## 2019-12-23 DIAGNOSIS — D508 Other iron deficiency anemias: Secondary | ICD-10-CM | POA: Diagnosis not present

## 2019-12-23 DIAGNOSIS — E785 Hyperlipidemia, unspecified: Secondary | ICD-10-CM | POA: Diagnosis not present

## 2019-12-23 DIAGNOSIS — L97513 Non-pressure chronic ulcer of other part of right foot with necrosis of muscle: Secondary | ICD-10-CM | POA: Diagnosis not present

## 2019-12-24 DIAGNOSIS — I10 Essential (primary) hypertension: Secondary | ICD-10-CM | POA: Diagnosis not present

## 2019-12-24 DIAGNOSIS — D508 Other iron deficiency anemias: Secondary | ICD-10-CM | POA: Diagnosis not present

## 2019-12-24 DIAGNOSIS — L97513 Non-pressure chronic ulcer of other part of right foot with necrosis of muscle: Secondary | ICD-10-CM | POA: Diagnosis not present

## 2019-12-24 DIAGNOSIS — E785 Hyperlipidemia, unspecified: Secondary | ICD-10-CM | POA: Diagnosis not present

## 2019-12-24 DIAGNOSIS — E119 Type 2 diabetes mellitus without complications: Secondary | ICD-10-CM | POA: Diagnosis not present

## 2019-12-25 DIAGNOSIS — D508 Other iron deficiency anemias: Secondary | ICD-10-CM | POA: Diagnosis not present

## 2019-12-25 DIAGNOSIS — I10 Essential (primary) hypertension: Secondary | ICD-10-CM | POA: Diagnosis not present

## 2019-12-25 DIAGNOSIS — E785 Hyperlipidemia, unspecified: Secondary | ICD-10-CM | POA: Diagnosis not present

## 2019-12-25 DIAGNOSIS — E119 Type 2 diabetes mellitus without complications: Secondary | ICD-10-CM | POA: Diagnosis not present

## 2019-12-25 DIAGNOSIS — L97513 Non-pressure chronic ulcer of other part of right foot with necrosis of muscle: Secondary | ICD-10-CM | POA: Diagnosis not present

## 2019-12-26 DIAGNOSIS — I10 Essential (primary) hypertension: Secondary | ICD-10-CM | POA: Diagnosis not present

## 2019-12-26 DIAGNOSIS — L97513 Non-pressure chronic ulcer of other part of right foot with necrosis of muscle: Secondary | ICD-10-CM | POA: Diagnosis not present

## 2019-12-26 DIAGNOSIS — D508 Other iron deficiency anemias: Secondary | ICD-10-CM | POA: Diagnosis not present

## 2019-12-26 DIAGNOSIS — E119 Type 2 diabetes mellitus without complications: Secondary | ICD-10-CM | POA: Diagnosis not present

## 2019-12-26 DIAGNOSIS — E785 Hyperlipidemia, unspecified: Secondary | ICD-10-CM | POA: Diagnosis not present

## 2019-12-27 DIAGNOSIS — I10 Essential (primary) hypertension: Secondary | ICD-10-CM | POA: Diagnosis not present

## 2019-12-27 DIAGNOSIS — D508 Other iron deficiency anemias: Secondary | ICD-10-CM | POA: Diagnosis not present

## 2019-12-27 DIAGNOSIS — L97513 Non-pressure chronic ulcer of other part of right foot with necrosis of muscle: Secondary | ICD-10-CM | POA: Diagnosis not present

## 2019-12-27 DIAGNOSIS — E119 Type 2 diabetes mellitus without complications: Secondary | ICD-10-CM | POA: Diagnosis not present

## 2019-12-27 DIAGNOSIS — E785 Hyperlipidemia, unspecified: Secondary | ICD-10-CM | POA: Diagnosis not present

## 2019-12-28 DIAGNOSIS — L97513 Non-pressure chronic ulcer of other part of right foot with necrosis of muscle: Secondary | ICD-10-CM | POA: Diagnosis not present

## 2019-12-28 DIAGNOSIS — E119 Type 2 diabetes mellitus without complications: Secondary | ICD-10-CM | POA: Diagnosis not present

## 2019-12-28 DIAGNOSIS — I10 Essential (primary) hypertension: Secondary | ICD-10-CM | POA: Diagnosis not present

## 2019-12-28 DIAGNOSIS — D508 Other iron deficiency anemias: Secondary | ICD-10-CM | POA: Diagnosis not present

## 2019-12-28 DIAGNOSIS — E785 Hyperlipidemia, unspecified: Secondary | ICD-10-CM | POA: Diagnosis not present

## 2019-12-29 DIAGNOSIS — E785 Hyperlipidemia, unspecified: Secondary | ICD-10-CM | POA: Diagnosis not present

## 2019-12-29 DIAGNOSIS — D508 Other iron deficiency anemias: Secondary | ICD-10-CM | POA: Diagnosis not present

## 2019-12-29 DIAGNOSIS — L97503 Non-pressure chronic ulcer of other part of unspecified foot with necrosis of muscle: Secondary | ICD-10-CM | POA: Diagnosis not present

## 2019-12-29 DIAGNOSIS — E119 Type 2 diabetes mellitus without complications: Secondary | ICD-10-CM | POA: Diagnosis not present

## 2019-12-29 DIAGNOSIS — I10 Essential (primary) hypertension: Secondary | ICD-10-CM | POA: Diagnosis not present

## 2019-12-30 DIAGNOSIS — E11621 Type 2 diabetes mellitus with foot ulcer: Secondary | ICD-10-CM | POA: Diagnosis not present

## 2019-12-30 DIAGNOSIS — I10 Essential (primary) hypertension: Secondary | ICD-10-CM | POA: Diagnosis not present

## 2019-12-30 DIAGNOSIS — E785 Hyperlipidemia, unspecified: Secondary | ICD-10-CM | POA: Diagnosis not present

## 2019-12-30 DIAGNOSIS — E119 Type 2 diabetes mellitus without complications: Secondary | ICD-10-CM | POA: Diagnosis not present

## 2019-12-30 DIAGNOSIS — L97401 Non-pressure chronic ulcer of unspecified heel and midfoot limited to breakdown of skin: Secondary | ICD-10-CM | POA: Diagnosis not present

## 2019-12-31 DIAGNOSIS — E118 Type 2 diabetes mellitus with unspecified complications: Secondary | ICD-10-CM | POA: Diagnosis not present

## 2019-12-31 DIAGNOSIS — I10 Essential (primary) hypertension: Secondary | ICD-10-CM | POA: Diagnosis not present

## 2019-12-31 DIAGNOSIS — T8753 Necrosis of amputation stump, right lower extremity: Secondary | ICD-10-CM | POA: Diagnosis not present

## 2019-12-31 DIAGNOSIS — D649 Anemia, unspecified: Secondary | ICD-10-CM | POA: Diagnosis not present

## 2020-01-01 DIAGNOSIS — E785 Hyperlipidemia, unspecified: Secondary | ICD-10-CM | POA: Diagnosis not present

## 2020-01-01 DIAGNOSIS — D649 Anemia, unspecified: Secondary | ICD-10-CM | POA: Diagnosis not present

## 2020-01-01 DIAGNOSIS — L97509 Non-pressure chronic ulcer of other part of unspecified foot with unspecified severity: Secondary | ICD-10-CM | POA: Diagnosis not present

## 2020-01-01 DIAGNOSIS — I10 Essential (primary) hypertension: Secondary | ICD-10-CM | POA: Diagnosis not present

## 2020-01-01 DIAGNOSIS — I739 Peripheral vascular disease, unspecified: Secondary | ICD-10-CM | POA: Diagnosis not present

## 2020-01-01 DIAGNOSIS — I2510 Atherosclerotic heart disease of native coronary artery without angina pectoris: Secondary | ICD-10-CM | POA: Diagnosis not present

## 2020-01-01 DIAGNOSIS — E11621 Type 2 diabetes mellitus with foot ulcer: Secondary | ICD-10-CM | POA: Diagnosis not present

## 2020-01-01 DIAGNOSIS — R197 Diarrhea, unspecified: Secondary | ICD-10-CM | POA: Diagnosis not present

## 2020-01-01 DIAGNOSIS — G8918 Other acute postprocedural pain: Secondary | ICD-10-CM | POA: Diagnosis not present

## 2020-01-02 DIAGNOSIS — E785 Hyperlipidemia, unspecified: Secondary | ICD-10-CM | POA: Diagnosis not present

## 2020-01-02 DIAGNOSIS — D649 Anemia, unspecified: Secondary | ICD-10-CM | POA: Diagnosis not present

## 2020-01-02 DIAGNOSIS — I739 Peripheral vascular disease, unspecified: Secondary | ICD-10-CM | POA: Diagnosis not present

## 2020-01-02 DIAGNOSIS — Z794 Long term (current) use of insulin: Secondary | ICD-10-CM | POA: Diagnosis not present

## 2020-01-02 DIAGNOSIS — T8753 Necrosis of amputation stump, right lower extremity: Secondary | ICD-10-CM | POA: Diagnosis not present

## 2020-01-02 DIAGNOSIS — G8918 Other acute postprocedural pain: Secondary | ICD-10-CM | POA: Diagnosis not present

## 2020-01-02 DIAGNOSIS — I2510 Atherosclerotic heart disease of native coronary artery without angina pectoris: Secondary | ICD-10-CM | POA: Diagnosis not present

## 2020-01-02 DIAGNOSIS — I10 Essential (primary) hypertension: Secondary | ICD-10-CM | POA: Diagnosis not present

## 2020-01-02 DIAGNOSIS — R197 Diarrhea, unspecified: Secondary | ICD-10-CM | POA: Diagnosis not present

## 2020-01-02 DIAGNOSIS — E1165 Type 2 diabetes mellitus with hyperglycemia: Secondary | ICD-10-CM | POA: Diagnosis not present

## 2020-01-02 DIAGNOSIS — E11621 Type 2 diabetes mellitus with foot ulcer: Secondary | ICD-10-CM | POA: Diagnosis not present

## 2020-01-02 DIAGNOSIS — L97509 Non-pressure chronic ulcer of other part of unspecified foot with unspecified severity: Secondary | ICD-10-CM | POA: Diagnosis not present

## 2020-01-03 DIAGNOSIS — D649 Anemia, unspecified: Secondary | ICD-10-CM | POA: Diagnosis not present

## 2020-01-03 DIAGNOSIS — I739 Peripheral vascular disease, unspecified: Secondary | ICD-10-CM | POA: Diagnosis not present

## 2020-01-03 DIAGNOSIS — L97509 Non-pressure chronic ulcer of other part of unspecified foot with unspecified severity: Secondary | ICD-10-CM | POA: Diagnosis not present

## 2020-01-03 DIAGNOSIS — M79671 Pain in right foot: Secondary | ICD-10-CM | POA: Diagnosis not present

## 2020-01-03 DIAGNOSIS — G8918 Other acute postprocedural pain: Secondary | ICD-10-CM | POA: Diagnosis not present

## 2020-01-03 DIAGNOSIS — I10 Essential (primary) hypertension: Secondary | ICD-10-CM | POA: Diagnosis not present

## 2020-01-03 DIAGNOSIS — I2510 Atherosclerotic heart disease of native coronary artery without angina pectoris: Secondary | ICD-10-CM | POA: Diagnosis not present

## 2020-01-03 DIAGNOSIS — E11621 Type 2 diabetes mellitus with foot ulcer: Secondary | ICD-10-CM | POA: Diagnosis not present

## 2020-01-03 DIAGNOSIS — R197 Diarrhea, unspecified: Secondary | ICD-10-CM | POA: Diagnosis not present

## 2020-01-04 DIAGNOSIS — T8753 Necrosis of amputation stump, right lower extremity: Secondary | ICD-10-CM | POA: Diagnosis not present

## 2020-01-04 DIAGNOSIS — D649 Anemia, unspecified: Secondary | ICD-10-CM | POA: Diagnosis not present

## 2020-01-04 DIAGNOSIS — E785 Hyperlipidemia, unspecified: Secondary | ICD-10-CM | POA: Diagnosis not present

## 2020-01-04 DIAGNOSIS — Z89421 Acquired absence of other right toe(s): Secondary | ICD-10-CM | POA: Diagnosis not present

## 2020-01-04 DIAGNOSIS — E611 Iron deficiency: Secondary | ICD-10-CM | POA: Diagnosis not present

## 2020-01-04 DIAGNOSIS — R112 Nausea with vomiting, unspecified: Secondary | ICD-10-CM | POA: Diagnosis not present

## 2020-01-04 DIAGNOSIS — T8781 Dehiscence of amputation stump: Secondary | ICD-10-CM | POA: Diagnosis not present

## 2020-01-04 DIAGNOSIS — R638 Other symptoms and signs concerning food and fluid intake: Secondary | ICD-10-CM | POA: Diagnosis not present

## 2020-01-04 DIAGNOSIS — I96 Gangrene, not elsewhere classified: Secondary | ICD-10-CM | POA: Diagnosis not present

## 2020-01-04 DIAGNOSIS — I10 Essential (primary) hypertension: Secondary | ICD-10-CM | POA: Diagnosis not present

## 2020-01-04 DIAGNOSIS — E119 Type 2 diabetes mellitus without complications: Secondary | ICD-10-CM | POA: Diagnosis not present

## 2020-01-05 DIAGNOSIS — D649 Anemia, unspecified: Secondary | ICD-10-CM | POA: Diagnosis not present

## 2020-01-05 DIAGNOSIS — Z7982 Long term (current) use of aspirin: Secondary | ICD-10-CM | POA: Diagnosis not present

## 2020-01-05 DIAGNOSIS — Z89429 Acquired absence of other toe(s), unspecified side: Secondary | ICD-10-CM | POA: Diagnosis not present

## 2020-01-05 DIAGNOSIS — Z89511 Acquired absence of right leg below knee: Secondary | ICD-10-CM | POA: Diagnosis not present

## 2020-01-05 DIAGNOSIS — T8753 Necrosis of amputation stump, right lower extremity: Secondary | ICD-10-CM | POA: Diagnosis not present

## 2020-01-05 DIAGNOSIS — I96 Gangrene, not elsewhere classified: Secondary | ICD-10-CM | POA: Diagnosis not present

## 2020-01-05 DIAGNOSIS — E119 Type 2 diabetes mellitus without complications: Secondary | ICD-10-CM | POA: Diagnosis not present

## 2020-01-05 DIAGNOSIS — T8781 Dehiscence of amputation stump: Secondary | ICD-10-CM | POA: Diagnosis not present

## 2020-01-05 DIAGNOSIS — E1165 Type 2 diabetes mellitus with hyperglycemia: Secondary | ICD-10-CM | POA: Diagnosis not present

## 2020-01-05 DIAGNOSIS — R638 Other symptoms and signs concerning food and fluid intake: Secondary | ICD-10-CM | POA: Diagnosis not present

## 2020-01-05 DIAGNOSIS — Z89421 Acquired absence of other right toe(s): Secondary | ICD-10-CM | POA: Diagnosis not present

## 2020-01-05 DIAGNOSIS — I2510 Atherosclerotic heart disease of native coronary artery without angina pectoris: Secondary | ICD-10-CM | POA: Diagnosis not present

## 2020-01-05 DIAGNOSIS — E611 Iron deficiency: Secondary | ICD-10-CM | POA: Diagnosis not present

## 2020-01-05 DIAGNOSIS — I10 Essential (primary) hypertension: Secondary | ICD-10-CM | POA: Diagnosis not present

## 2020-01-05 DIAGNOSIS — E1152 Type 2 diabetes mellitus with diabetic peripheral angiopathy with gangrene: Secondary | ICD-10-CM | POA: Diagnosis not present

## 2020-01-05 DIAGNOSIS — I739 Peripheral vascular disease, unspecified: Secondary | ICD-10-CM | POA: Diagnosis not present

## 2020-01-05 DIAGNOSIS — E1142 Type 2 diabetes mellitus with diabetic polyneuropathy: Secondary | ICD-10-CM | POA: Diagnosis not present

## 2020-01-05 DIAGNOSIS — Z602 Problems related to living alone: Secondary | ICD-10-CM | POA: Diagnosis not present

## 2020-01-05 DIAGNOSIS — E785 Hyperlipidemia, unspecified: Secondary | ICD-10-CM | POA: Diagnosis not present

## 2020-01-05 DIAGNOSIS — I252 Old myocardial infarction: Secondary | ICD-10-CM | POA: Diagnosis not present

## 2020-01-05 DIAGNOSIS — Z7984 Long term (current) use of oral hypoglycemic drugs: Secondary | ICD-10-CM | POA: Diagnosis not present

## 2020-01-05 DIAGNOSIS — Z87891 Personal history of nicotine dependence: Secondary | ICD-10-CM | POA: Diagnosis not present

## 2020-01-05 DIAGNOSIS — S91301A Unspecified open wound, right foot, initial encounter: Secondary | ICD-10-CM | POA: Diagnosis not present

## 2020-01-05 DIAGNOSIS — J810 Acute pulmonary edema: Secondary | ICD-10-CM | POA: Diagnosis not present

## 2020-01-05 DIAGNOSIS — D509 Iron deficiency anemia, unspecified: Secondary | ICD-10-CM | POA: Diagnosis not present

## 2020-01-05 DIAGNOSIS — J90 Pleural effusion, not elsewhere classified: Secondary | ICD-10-CM | POA: Diagnosis not present

## 2020-01-06 DIAGNOSIS — Z794 Long term (current) use of insulin: Secondary | ICD-10-CM | POA: Diagnosis not present

## 2020-01-06 DIAGNOSIS — I96 Gangrene, not elsewhere classified: Secondary | ICD-10-CM | POA: Diagnosis not present

## 2020-01-06 DIAGNOSIS — E11649 Type 2 diabetes mellitus with hypoglycemia without coma: Secondary | ICD-10-CM | POA: Diagnosis not present

## 2020-01-06 HISTORY — PX: BELOW KNEE LEG AMPUTATION: SUR23

## 2020-01-07 DIAGNOSIS — E118 Type 2 diabetes mellitus with unspecified complications: Secondary | ICD-10-CM | POA: Diagnosis not present

## 2020-01-07 DIAGNOSIS — E119 Type 2 diabetes mellitus without complications: Secondary | ICD-10-CM | POA: Diagnosis not present

## 2020-01-07 DIAGNOSIS — Z89511 Acquired absence of right leg below knee: Secondary | ICD-10-CM | POA: Diagnosis not present

## 2020-01-07 DIAGNOSIS — G8918 Other acute postprocedural pain: Secondary | ICD-10-CM | POA: Diagnosis not present

## 2020-01-07 DIAGNOSIS — R638 Other symptoms and signs concerning food and fluid intake: Secondary | ICD-10-CM | POA: Diagnosis not present

## 2020-01-07 DIAGNOSIS — Z89421 Acquired absence of other right toe(s): Secondary | ICD-10-CM | POA: Diagnosis not present

## 2020-01-07 DIAGNOSIS — T8753 Necrosis of amputation stump, right lower extremity: Secondary | ICD-10-CM | POA: Diagnosis not present

## 2020-01-07 DIAGNOSIS — M79604 Pain in right leg: Secondary | ICD-10-CM | POA: Diagnosis not present

## 2020-01-07 DIAGNOSIS — Z79899 Other long term (current) drug therapy: Secondary | ICD-10-CM | POA: Diagnosis not present

## 2020-01-07 DIAGNOSIS — Z794 Long term (current) use of insulin: Secondary | ICD-10-CM | POA: Diagnosis not present

## 2020-01-07 DIAGNOSIS — M869 Osteomyelitis, unspecified: Secondary | ICD-10-CM | POA: Diagnosis not present

## 2020-01-08 DIAGNOSIS — R638 Other symptoms and signs concerning food and fluid intake: Secondary | ICD-10-CM | POA: Diagnosis not present

## 2020-01-08 DIAGNOSIS — E1165 Type 2 diabetes mellitus with hyperglycemia: Secondary | ICD-10-CM | POA: Diagnosis not present

## 2020-01-08 DIAGNOSIS — M79604 Pain in right leg: Secondary | ICD-10-CM | POA: Diagnosis not present

## 2020-01-08 DIAGNOSIS — G8918 Other acute postprocedural pain: Secondary | ICD-10-CM | POA: Diagnosis not present

## 2020-01-08 DIAGNOSIS — Z89511 Acquired absence of right leg below knee: Secondary | ICD-10-CM | POA: Diagnosis not present

## 2020-01-08 DIAGNOSIS — E118 Type 2 diabetes mellitus with unspecified complications: Secondary | ICD-10-CM | POA: Diagnosis not present

## 2020-01-08 DIAGNOSIS — Z79899 Other long term (current) drug therapy: Secondary | ICD-10-CM | POA: Diagnosis not present

## 2020-01-08 DIAGNOSIS — Z794 Long term (current) use of insulin: Secondary | ICD-10-CM | POA: Diagnosis not present

## 2020-01-09 DIAGNOSIS — E1165 Type 2 diabetes mellitus with hyperglycemia: Secondary | ICD-10-CM | POA: Diagnosis not present

## 2020-01-09 DIAGNOSIS — E118 Type 2 diabetes mellitus with unspecified complications: Secondary | ICD-10-CM | POA: Diagnosis not present

## 2020-01-09 DIAGNOSIS — Z89511 Acquired absence of right leg below knee: Secondary | ICD-10-CM | POA: Diagnosis not present

## 2020-01-09 DIAGNOSIS — Z79899 Other long term (current) drug therapy: Secondary | ICD-10-CM | POA: Diagnosis not present

## 2020-01-09 DIAGNOSIS — M79604 Pain in right leg: Secondary | ICD-10-CM | POA: Diagnosis not present

## 2020-01-09 DIAGNOSIS — R739 Hyperglycemia, unspecified: Secondary | ICD-10-CM | POA: Diagnosis not present

## 2020-01-09 DIAGNOSIS — R638 Other symptoms and signs concerning food and fluid intake: Secondary | ICD-10-CM | POA: Diagnosis not present

## 2020-01-09 DIAGNOSIS — Z794 Long term (current) use of insulin: Secondary | ICD-10-CM | POA: Diagnosis not present

## 2020-01-09 DIAGNOSIS — G8918 Other acute postprocedural pain: Secondary | ICD-10-CM | POA: Diagnosis not present

## 2020-01-10 DIAGNOSIS — G8918 Other acute postprocedural pain: Secondary | ICD-10-CM | POA: Diagnosis not present

## 2020-01-10 DIAGNOSIS — M79604 Pain in right leg: Secondary | ICD-10-CM | POA: Diagnosis not present

## 2020-01-10 DIAGNOSIS — R000 Tachycardia, unspecified: Secondary | ICD-10-CM | POA: Diagnosis not present

## 2020-01-10 DIAGNOSIS — I499 Cardiac arrhythmia, unspecified: Secondary | ICD-10-CM | POA: Diagnosis not present

## 2020-01-11 DIAGNOSIS — Z79899 Other long term (current) drug therapy: Secondary | ICD-10-CM | POA: Diagnosis not present

## 2020-01-11 DIAGNOSIS — E1165 Type 2 diabetes mellitus with hyperglycemia: Secondary | ICD-10-CM | POA: Diagnosis not present

## 2020-01-11 DIAGNOSIS — Z794 Long term (current) use of insulin: Secondary | ICD-10-CM | POA: Diagnosis not present

## 2020-01-11 DIAGNOSIS — R638 Other symptoms and signs concerning food and fluid intake: Secondary | ICD-10-CM | POA: Diagnosis not present

## 2020-01-11 DIAGNOSIS — Z89511 Acquired absence of right leg below knee: Secondary | ICD-10-CM | POA: Diagnosis not present

## 2020-01-11 DIAGNOSIS — M79604 Pain in right leg: Secondary | ICD-10-CM | POA: Diagnosis not present

## 2020-01-11 DIAGNOSIS — E118 Type 2 diabetes mellitus with unspecified complications: Secondary | ICD-10-CM | POA: Diagnosis not present

## 2020-01-11 DIAGNOSIS — G8918 Other acute postprocedural pain: Secondary | ICD-10-CM | POA: Diagnosis not present

## 2020-01-12 DIAGNOSIS — Z79899 Other long term (current) drug therapy: Secondary | ICD-10-CM | POA: Diagnosis not present

## 2020-01-12 DIAGNOSIS — E1165 Type 2 diabetes mellitus with hyperglycemia: Secondary | ICD-10-CM | POA: Diagnosis not present

## 2020-01-12 DIAGNOSIS — E118 Type 2 diabetes mellitus with unspecified complications: Secondary | ICD-10-CM | POA: Diagnosis not present

## 2020-01-12 DIAGNOSIS — Z89511 Acquired absence of right leg below knee: Secondary | ICD-10-CM | POA: Diagnosis not present

## 2020-01-12 DIAGNOSIS — R638 Other symptoms and signs concerning food and fluid intake: Secondary | ICD-10-CM | POA: Diagnosis not present

## 2020-01-12 DIAGNOSIS — Z794 Long term (current) use of insulin: Secondary | ICD-10-CM | POA: Diagnosis not present

## 2020-01-13 DIAGNOSIS — E1165 Type 2 diabetes mellitus with hyperglycemia: Secondary | ICD-10-CM | POA: Diagnosis not present

## 2020-01-13 DIAGNOSIS — E118 Type 2 diabetes mellitus with unspecified complications: Secondary | ICD-10-CM | POA: Diagnosis not present

## 2020-01-13 DIAGNOSIS — Z89511 Acquired absence of right leg below knee: Secondary | ICD-10-CM | POA: Diagnosis not present

## 2020-01-13 DIAGNOSIS — Z79899 Other long term (current) drug therapy: Secondary | ICD-10-CM | POA: Diagnosis not present

## 2020-01-13 DIAGNOSIS — R638 Other symptoms and signs concerning food and fluid intake: Secondary | ICD-10-CM | POA: Diagnosis not present

## 2020-01-13 DIAGNOSIS — Z794 Long term (current) use of insulin: Secondary | ICD-10-CM | POA: Diagnosis not present

## 2020-01-14 DIAGNOSIS — E1165 Type 2 diabetes mellitus with hyperglycemia: Secondary | ICD-10-CM | POA: Diagnosis not present

## 2020-01-14 DIAGNOSIS — E118 Type 2 diabetes mellitus with unspecified complications: Secondary | ICD-10-CM | POA: Diagnosis not present

## 2020-01-14 DIAGNOSIS — Z89511 Acquired absence of right leg below knee: Secondary | ICD-10-CM | POA: Diagnosis not present

## 2020-01-14 DIAGNOSIS — R638 Other symptoms and signs concerning food and fluid intake: Secondary | ICD-10-CM | POA: Diagnosis not present

## 2020-01-14 DIAGNOSIS — Z794 Long term (current) use of insulin: Secondary | ICD-10-CM | POA: Diagnosis not present

## 2020-01-14 DIAGNOSIS — Z79899 Other long term (current) drug therapy: Secondary | ICD-10-CM | POA: Diagnosis not present

## 2020-01-15 DIAGNOSIS — E1165 Type 2 diabetes mellitus with hyperglycemia: Secondary | ICD-10-CM | POA: Diagnosis not present

## 2020-01-15 DIAGNOSIS — E118 Type 2 diabetes mellitus with unspecified complications: Secondary | ICD-10-CM | POA: Diagnosis not present

## 2020-01-15 DIAGNOSIS — Z794 Long term (current) use of insulin: Secondary | ICD-10-CM | POA: Diagnosis not present

## 2020-01-16 DIAGNOSIS — Z794 Long term (current) use of insulin: Secondary | ICD-10-CM | POA: Diagnosis not present

## 2020-01-16 DIAGNOSIS — E1165 Type 2 diabetes mellitus with hyperglycemia: Secondary | ICD-10-CM | POA: Diagnosis not present

## 2020-01-17 DIAGNOSIS — E118 Type 2 diabetes mellitus with unspecified complications: Secondary | ICD-10-CM | POA: Diagnosis not present

## 2020-01-17 DIAGNOSIS — Z794 Long term (current) use of insulin: Secondary | ICD-10-CM | POA: Diagnosis not present

## 2020-01-17 DIAGNOSIS — E1165 Type 2 diabetes mellitus with hyperglycemia: Secondary | ICD-10-CM | POA: Diagnosis not present

## 2020-01-18 DIAGNOSIS — E1165 Type 2 diabetes mellitus with hyperglycemia: Secondary | ICD-10-CM | POA: Diagnosis not present

## 2020-01-18 DIAGNOSIS — Z794 Long term (current) use of insulin: Secondary | ICD-10-CM | POA: Diagnosis not present

## 2020-01-19 DIAGNOSIS — Z794 Long term (current) use of insulin: Secondary | ICD-10-CM | POA: Diagnosis not present

## 2020-01-19 DIAGNOSIS — E1165 Type 2 diabetes mellitus with hyperglycemia: Secondary | ICD-10-CM | POA: Diagnosis not present

## 2020-01-20 DIAGNOSIS — I739 Peripheral vascular disease, unspecified: Secondary | ICD-10-CM | POA: Diagnosis not present

## 2020-01-20 DIAGNOSIS — Z794 Long term (current) use of insulin: Secondary | ICD-10-CM | POA: Diagnosis not present

## 2020-01-20 DIAGNOSIS — E11649 Type 2 diabetes mellitus with hypoglycemia without coma: Secondary | ICD-10-CM | POA: Diagnosis not present

## 2020-01-21 DIAGNOSIS — Z794 Long term (current) use of insulin: Secondary | ICD-10-CM | POA: Diagnosis not present

## 2020-01-21 DIAGNOSIS — E118 Type 2 diabetes mellitus with unspecified complications: Secondary | ICD-10-CM | POA: Diagnosis not present

## 2020-01-21 DIAGNOSIS — E1165 Type 2 diabetes mellitus with hyperglycemia: Secondary | ICD-10-CM | POA: Diagnosis not present

## 2020-01-22 DIAGNOSIS — J811 Chronic pulmonary edema: Secondary | ICD-10-CM | POA: Diagnosis not present

## 2020-01-22 DIAGNOSIS — Z794 Long term (current) use of insulin: Secondary | ICD-10-CM | POA: Diagnosis not present

## 2020-01-22 DIAGNOSIS — R0902 Hypoxemia: Secondary | ICD-10-CM | POA: Diagnosis not present

## 2020-01-22 DIAGNOSIS — J90 Pleural effusion, not elsewhere classified: Secondary | ICD-10-CM | POA: Diagnosis not present

## 2020-01-22 DIAGNOSIS — E1165 Type 2 diabetes mellitus with hyperglycemia: Secondary | ICD-10-CM | POA: Diagnosis not present

## 2020-01-22 DIAGNOSIS — Z89511 Acquired absence of right leg below knee: Secondary | ICD-10-CM | POA: Diagnosis not present

## 2020-01-22 DIAGNOSIS — M86171 Other acute osteomyelitis, right ankle and foot: Secondary | ICD-10-CM | POA: Diagnosis not present

## 2020-01-22 DIAGNOSIS — E118 Type 2 diabetes mellitus with unspecified complications: Secondary | ICD-10-CM | POA: Diagnosis not present

## 2020-01-22 DIAGNOSIS — Z79899 Other long term (current) drug therapy: Secondary | ICD-10-CM | POA: Diagnosis not present

## 2020-01-23 DIAGNOSIS — Z79899 Other long term (current) drug therapy: Secondary | ICD-10-CM | POA: Diagnosis not present

## 2020-01-23 DIAGNOSIS — Z794 Long term (current) use of insulin: Secondary | ICD-10-CM | POA: Diagnosis not present

## 2020-01-23 DIAGNOSIS — E11649 Type 2 diabetes mellitus with hypoglycemia without coma: Secondary | ICD-10-CM | POA: Diagnosis not present

## 2020-01-23 DIAGNOSIS — Z89511 Acquired absence of right leg below knee: Secondary | ICD-10-CM | POA: Diagnosis not present

## 2020-01-23 DIAGNOSIS — M86171 Other acute osteomyelitis, right ankle and foot: Secondary | ICD-10-CM | POA: Diagnosis not present

## 2020-01-24 DIAGNOSIS — J9811 Atelectasis: Secondary | ICD-10-CM | POA: Diagnosis not present

## 2020-01-24 DIAGNOSIS — J90 Pleural effusion, not elsewhere classified: Secondary | ICD-10-CM | POA: Diagnosis not present

## 2020-01-26 DIAGNOSIS — E118 Type 2 diabetes mellitus with unspecified complications: Secondary | ICD-10-CM | POA: Diagnosis not present

## 2020-01-26 DIAGNOSIS — M86171 Other acute osteomyelitis, right ankle and foot: Secondary | ICD-10-CM | POA: Diagnosis not present

## 2020-01-26 DIAGNOSIS — Z794 Long term (current) use of insulin: Secondary | ICD-10-CM | POA: Diagnosis not present

## 2020-01-26 DIAGNOSIS — E11649 Type 2 diabetes mellitus with hypoglycemia without coma: Secondary | ICD-10-CM | POA: Diagnosis not present

## 2020-01-26 DIAGNOSIS — Z79899 Other long term (current) drug therapy: Secondary | ICD-10-CM | POA: Diagnosis not present

## 2020-01-26 DIAGNOSIS — Z89511 Acquired absence of right leg below knee: Secondary | ICD-10-CM | POA: Diagnosis not present

## 2020-01-27 DIAGNOSIS — A499 Bacterial infection, unspecified: Secondary | ICD-10-CM | POA: Diagnosis not present

## 2020-01-27 DIAGNOSIS — R110 Nausea: Secondary | ICD-10-CM | POA: Diagnosis not present

## 2020-01-27 DIAGNOSIS — Z89511 Acquired absence of right leg below knee: Secondary | ICD-10-CM | POA: Diagnosis not present

## 2020-01-27 DIAGNOSIS — F339 Major depressive disorder, recurrent, unspecified: Secondary | ICD-10-CM | POA: Diagnosis not present

## 2020-01-27 DIAGNOSIS — K5900 Constipation, unspecified: Secondary | ICD-10-CM | POA: Diagnosis not present

## 2020-01-27 DIAGNOSIS — D509 Iron deficiency anemia, unspecified: Secondary | ICD-10-CM | POA: Diagnosis not present

## 2020-01-27 DIAGNOSIS — Z79899 Other long term (current) drug therapy: Secondary | ICD-10-CM | POA: Diagnosis not present

## 2020-01-27 DIAGNOSIS — Z111 Encounter for screening for respiratory tuberculosis: Secondary | ICD-10-CM | POA: Diagnosis not present

## 2020-01-27 DIAGNOSIS — I10 Essential (primary) hypertension: Secondary | ICD-10-CM | POA: Diagnosis not present

## 2020-01-27 DIAGNOSIS — E569 Vitamin deficiency, unspecified: Secondary | ICD-10-CM | POA: Diagnosis not present

## 2020-01-27 DIAGNOSIS — F5101 Primary insomnia: Secondary | ICD-10-CM | POA: Diagnosis not present

## 2020-01-27 DIAGNOSIS — M6281 Muscle weakness (generalized): Secondary | ICD-10-CM | POA: Diagnosis not present

## 2020-01-27 DIAGNOSIS — R0602 Shortness of breath: Secondary | ICD-10-CM | POA: Diagnosis not present

## 2020-01-27 DIAGNOSIS — E118 Type 2 diabetes mellitus with unspecified complications: Secondary | ICD-10-CM | POA: Diagnosis not present

## 2020-01-27 DIAGNOSIS — E612 Magnesium deficiency: Secondary | ICD-10-CM | POA: Diagnosis not present

## 2020-01-27 DIAGNOSIS — R52 Pain, unspecified: Secondary | ICD-10-CM | POA: Diagnosis not present

## 2020-01-27 DIAGNOSIS — M86171 Other acute osteomyelitis, right ankle and foot: Secondary | ICD-10-CM | POA: Diagnosis not present

## 2020-01-27 DIAGNOSIS — Z794 Long term (current) use of insulin: Secondary | ICD-10-CM | POA: Diagnosis not present

## 2020-01-27 DIAGNOSIS — I2510 Atherosclerotic heart disease of native coronary artery without angina pectoris: Secondary | ICD-10-CM | POA: Diagnosis not present

## 2020-01-27 DIAGNOSIS — E1165 Type 2 diabetes mellitus with hyperglycemia: Secondary | ICD-10-CM | POA: Diagnosis not present

## 2020-01-27 DIAGNOSIS — I5021 Acute systolic (congestive) heart failure: Secondary | ICD-10-CM | POA: Diagnosis not present

## 2020-01-27 DIAGNOSIS — E785 Hyperlipidemia, unspecified: Secondary | ICD-10-CM | POA: Diagnosis not present

## 2020-01-29 DIAGNOSIS — Z89511 Acquired absence of right leg below knee: Secondary | ICD-10-CM | POA: Diagnosis not present

## 2020-02-03 DIAGNOSIS — Z89511 Acquired absence of right leg below knee: Secondary | ICD-10-CM | POA: Diagnosis not present

## 2020-02-10 DIAGNOSIS — Z89511 Acquired absence of right leg below knee: Secondary | ICD-10-CM | POA: Diagnosis not present

## 2020-02-10 DIAGNOSIS — J90 Pleural effusion, not elsewhere classified: Secondary | ICD-10-CM | POA: Diagnosis not present

## 2020-02-10 DIAGNOSIS — J984 Other disorders of lung: Secondary | ICD-10-CM | POA: Diagnosis not present

## 2020-02-15 DIAGNOSIS — I110 Hypertensive heart disease with heart failure: Secondary | ICD-10-CM | POA: Diagnosis not present

## 2020-02-15 DIAGNOSIS — Z20822 Contact with and (suspected) exposure to covid-19: Secondary | ICD-10-CM | POA: Diagnosis not present

## 2020-02-15 DIAGNOSIS — I5023 Acute on chronic systolic (congestive) heart failure: Secondary | ICD-10-CM | POA: Diagnosis not present

## 2020-02-15 DIAGNOSIS — I8290 Acute embolism and thrombosis of unspecified vein: Secondary | ICD-10-CM

## 2020-02-15 DIAGNOSIS — I2694 Multiple subsegmental pulmonary emboli without acute cor pulmonale: Secondary | ICD-10-CM | POA: Insufficient documentation

## 2020-02-15 DIAGNOSIS — Z86711 Personal history of pulmonary embolism: Secondary | ICD-10-CM | POA: Insufficient documentation

## 2020-02-15 DIAGNOSIS — Z6821 Body mass index (BMI) 21.0-21.9, adult: Secondary | ICD-10-CM | POA: Diagnosis not present

## 2020-02-15 DIAGNOSIS — J90 Pleural effusion, not elsewhere classified: Secondary | ICD-10-CM | POA: Diagnosis not present

## 2020-02-15 DIAGNOSIS — R0600 Dyspnea, unspecified: Secondary | ICD-10-CM | POA: Diagnosis not present

## 2020-02-15 DIAGNOSIS — R0602 Shortness of breath: Secondary | ICD-10-CM | POA: Diagnosis not present

## 2020-02-15 DIAGNOSIS — R918 Other nonspecific abnormal finding of lung field: Secondary | ICD-10-CM | POA: Diagnosis not present

## 2020-02-15 DIAGNOSIS — I2699 Other pulmonary embolism without acute cor pulmonale: Secondary | ICD-10-CM

## 2020-02-15 HISTORY — DX: Acute embolism and thrombosis of unspecified vein: I82.90

## 2020-02-15 HISTORY — DX: Other pulmonary embolism without acute cor pulmonale: I26.99

## 2020-02-16 DIAGNOSIS — I5020 Unspecified systolic (congestive) heart failure: Secondary | ICD-10-CM | POA: Insufficient documentation

## 2020-02-16 DIAGNOSIS — I361 Nonrheumatic tricuspid (valve) insufficiency: Secondary | ICD-10-CM | POA: Diagnosis not present

## 2020-02-16 DIAGNOSIS — R931 Abnormal findings on diagnostic imaging of heart and coronary circulation: Secondary | ICD-10-CM | POA: Diagnosis not present

## 2020-02-16 DIAGNOSIS — R609 Edema, unspecified: Secondary | ICD-10-CM | POA: Diagnosis not present

## 2020-02-16 DIAGNOSIS — I824Y1 Acute embolism and thrombosis of unspecified deep veins of right proximal lower extremity: Secondary | ICD-10-CM | POA: Diagnosis not present

## 2020-02-16 DIAGNOSIS — J90 Pleural effusion, not elsewhere classified: Secondary | ICD-10-CM | POA: Diagnosis not present

## 2020-02-16 DIAGNOSIS — I82409 Acute embolism and thrombosis of unspecified deep veins of unspecified lower extremity: Secondary | ICD-10-CM

## 2020-02-16 DIAGNOSIS — I340 Nonrheumatic mitral (valve) insufficiency: Secondary | ICD-10-CM | POA: Diagnosis not present

## 2020-02-16 DIAGNOSIS — I272 Pulmonary hypertension, unspecified: Secondary | ICD-10-CM | POA: Diagnosis not present

## 2020-02-16 DIAGNOSIS — I2694 Multiple subsegmental pulmonary emboli without acute cor pulmonale: Secondary | ICD-10-CM | POA: Diagnosis not present

## 2020-02-16 DIAGNOSIS — I313 Pericardial effusion (noninflammatory): Secondary | ICD-10-CM | POA: Diagnosis not present

## 2020-02-16 DIAGNOSIS — I2510 Atherosclerotic heart disease of native coronary artery without angina pectoris: Secondary | ICD-10-CM | POA: Diagnosis not present

## 2020-02-16 DIAGNOSIS — E1165 Type 2 diabetes mellitus with hyperglycemia: Secondary | ICD-10-CM | POA: Diagnosis not present

## 2020-02-16 HISTORY — DX: Unspecified systolic (congestive) heart failure: I50.20

## 2020-02-16 HISTORY — DX: Acute embolism and thrombosis of unspecified deep veins of unspecified lower extremity: I82.409

## 2020-02-17 DIAGNOSIS — I2510 Atherosclerotic heart disease of native coronary artery without angina pectoris: Secondary | ICD-10-CM | POA: Diagnosis not present

## 2020-02-17 DIAGNOSIS — I2694 Multiple subsegmental pulmonary emboli without acute cor pulmonale: Secondary | ICD-10-CM | POA: Diagnosis not present

## 2020-02-17 DIAGNOSIS — I5020 Unspecified systolic (congestive) heart failure: Secondary | ICD-10-CM | POA: Diagnosis not present

## 2020-02-17 DIAGNOSIS — I5021 Acute systolic (congestive) heart failure: Secondary | ICD-10-CM | POA: Diagnosis not present

## 2020-02-17 DIAGNOSIS — I2699 Other pulmonary embolism without acute cor pulmonale: Secondary | ICD-10-CM | POA: Diagnosis not present

## 2020-02-17 DIAGNOSIS — E1165 Type 2 diabetes mellitus with hyperglycemia: Secondary | ICD-10-CM | POA: Diagnosis not present

## 2020-02-17 DIAGNOSIS — I82401 Acute embolism and thrombosis of unspecified deep veins of right lower extremity: Secondary | ICD-10-CM | POA: Diagnosis not present

## 2020-02-18 DIAGNOSIS — E1165 Type 2 diabetes mellitus with hyperglycemia: Secondary | ICD-10-CM | POA: Diagnosis not present

## 2020-02-18 DIAGNOSIS — I5021 Acute systolic (congestive) heart failure: Secondary | ICD-10-CM | POA: Diagnosis not present

## 2020-02-18 DIAGNOSIS — J90 Pleural effusion, not elsewhere classified: Secondary | ICD-10-CM | POA: Diagnosis not present

## 2020-02-18 DIAGNOSIS — I2694 Multiple subsegmental pulmonary emboli without acute cor pulmonale: Secondary | ICD-10-CM | POA: Diagnosis not present

## 2020-02-18 DIAGNOSIS — R609 Edema, unspecified: Secondary | ICD-10-CM | POA: Diagnosis not present

## 2020-02-18 DIAGNOSIS — I2699 Other pulmonary embolism without acute cor pulmonale: Secondary | ICD-10-CM | POA: Diagnosis not present

## 2020-02-18 DIAGNOSIS — I2510 Atherosclerotic heart disease of native coronary artery without angina pectoris: Secondary | ICD-10-CM | POA: Diagnosis not present

## 2020-02-18 DIAGNOSIS — I5020 Unspecified systolic (congestive) heart failure: Secondary | ICD-10-CM | POA: Diagnosis not present

## 2020-02-19 DIAGNOSIS — I2699 Other pulmonary embolism without acute cor pulmonale: Secondary | ICD-10-CM | POA: Diagnosis not present

## 2020-02-19 DIAGNOSIS — I2510 Atherosclerotic heart disease of native coronary artery without angina pectoris: Secondary | ICD-10-CM | POA: Diagnosis not present

## 2020-02-19 DIAGNOSIS — J90 Pleural effusion, not elsewhere classified: Secondary | ICD-10-CM | POA: Diagnosis not present

## 2020-02-19 DIAGNOSIS — E1165 Type 2 diabetes mellitus with hyperglycemia: Secondary | ICD-10-CM | POA: Diagnosis not present

## 2020-02-19 DIAGNOSIS — R609 Edema, unspecified: Secondary | ICD-10-CM | POA: Diagnosis not present

## 2020-02-19 DIAGNOSIS — I5020 Unspecified systolic (congestive) heart failure: Secondary | ICD-10-CM | POA: Diagnosis not present

## 2020-02-19 DIAGNOSIS — I2694 Multiple subsegmental pulmonary emboli without acute cor pulmonale: Secondary | ICD-10-CM | POA: Diagnosis not present

## 2020-02-19 DIAGNOSIS — I5021 Acute systolic (congestive) heart failure: Secondary | ICD-10-CM | POA: Diagnosis not present

## 2020-02-20 DIAGNOSIS — R609 Edema, unspecified: Secondary | ICD-10-CM | POA: Diagnosis not present

## 2020-02-20 DIAGNOSIS — I5020 Unspecified systolic (congestive) heart failure: Secondary | ICD-10-CM | POA: Diagnosis not present

## 2020-02-20 DIAGNOSIS — J90 Pleural effusion, not elsewhere classified: Secondary | ICD-10-CM | POA: Diagnosis not present

## 2020-02-20 DIAGNOSIS — I2510 Atherosclerotic heart disease of native coronary artery without angina pectoris: Secondary | ICD-10-CM | POA: Diagnosis not present

## 2020-02-20 DIAGNOSIS — I2694 Multiple subsegmental pulmonary emboli without acute cor pulmonale: Secondary | ICD-10-CM | POA: Diagnosis not present

## 2020-02-20 DIAGNOSIS — E1165 Type 2 diabetes mellitus with hyperglycemia: Secondary | ICD-10-CM | POA: Diagnosis not present

## 2020-02-21 DIAGNOSIS — I10 Essential (primary) hypertension: Secondary | ICD-10-CM | POA: Diagnosis not present

## 2020-02-21 DIAGNOSIS — E1165 Type 2 diabetes mellitus with hyperglycemia: Secondary | ICD-10-CM | POA: Diagnosis not present

## 2020-02-21 DIAGNOSIS — I2609 Other pulmonary embolism with acute cor pulmonale: Secondary | ICD-10-CM | POA: Diagnosis not present

## 2020-02-21 DIAGNOSIS — I739 Peripheral vascular disease, unspecified: Secondary | ICD-10-CM | POA: Diagnosis not present

## 2020-02-21 DIAGNOSIS — R609 Edema, unspecified: Secondary | ICD-10-CM | POA: Diagnosis not present

## 2020-02-21 DIAGNOSIS — I5020 Unspecified systolic (congestive) heart failure: Secondary | ICD-10-CM | POA: Diagnosis not present

## 2020-02-21 DIAGNOSIS — I5021 Acute systolic (congestive) heart failure: Secondary | ICD-10-CM | POA: Diagnosis not present

## 2020-02-21 DIAGNOSIS — I2510 Atherosclerotic heart disease of native coronary artery without angina pectoris: Secondary | ICD-10-CM | POA: Diagnosis not present

## 2020-02-21 DIAGNOSIS — I252 Old myocardial infarction: Secondary | ICD-10-CM | POA: Diagnosis not present

## 2020-02-21 DIAGNOSIS — I2694 Multiple subsegmental pulmonary emboli without acute cor pulmonale: Secondary | ICD-10-CM | POA: Diagnosis not present

## 2020-02-21 DIAGNOSIS — J90 Pleural effusion, not elsewhere classified: Secondary | ICD-10-CM | POA: Diagnosis not present

## 2020-02-22 DIAGNOSIS — R609 Edema, unspecified: Secondary | ICD-10-CM | POA: Diagnosis not present

## 2020-02-22 DIAGNOSIS — I2694 Multiple subsegmental pulmonary emboli without acute cor pulmonale: Secondary | ICD-10-CM | POA: Diagnosis not present

## 2020-02-22 DIAGNOSIS — J90 Pleural effusion, not elsewhere classified: Secondary | ICD-10-CM | POA: Diagnosis not present

## 2020-02-22 DIAGNOSIS — E1165 Type 2 diabetes mellitus with hyperglycemia: Secondary | ICD-10-CM | POA: Diagnosis not present

## 2020-02-22 DIAGNOSIS — I5020 Unspecified systolic (congestive) heart failure: Secondary | ICD-10-CM | POA: Diagnosis not present

## 2020-02-22 DIAGNOSIS — I2510 Atherosclerotic heart disease of native coronary artery without angina pectoris: Secondary | ICD-10-CM | POA: Diagnosis not present

## 2020-02-23 DIAGNOSIS — I2694 Multiple subsegmental pulmonary emboli without acute cor pulmonale: Secondary | ICD-10-CM | POA: Diagnosis not present

## 2020-02-23 DIAGNOSIS — J90 Pleural effusion, not elsewhere classified: Secondary | ICD-10-CM | POA: Diagnosis not present

## 2020-02-23 DIAGNOSIS — R609 Edema, unspecified: Secondary | ICD-10-CM | POA: Diagnosis not present

## 2020-02-23 DIAGNOSIS — I5020 Unspecified systolic (congestive) heart failure: Secondary | ICD-10-CM | POA: Diagnosis not present

## 2020-02-23 DIAGNOSIS — E1165 Type 2 diabetes mellitus with hyperglycemia: Secondary | ICD-10-CM | POA: Diagnosis not present

## 2020-02-23 DIAGNOSIS — I2510 Atherosclerotic heart disease of native coronary artery without angina pectoris: Secondary | ICD-10-CM | POA: Diagnosis not present

## 2020-02-24 DIAGNOSIS — R52 Pain, unspecified: Secondary | ICD-10-CM | POA: Diagnosis not present

## 2020-02-24 DIAGNOSIS — R5381 Other malaise: Secondary | ICD-10-CM | POA: Diagnosis not present

## 2020-02-24 DIAGNOSIS — I5020 Unspecified systolic (congestive) heart failure: Secondary | ICD-10-CM | POA: Diagnosis not present

## 2020-02-24 DIAGNOSIS — R2689 Other abnormalities of gait and mobility: Secondary | ICD-10-CM | POA: Diagnosis not present

## 2020-02-24 DIAGNOSIS — E612 Magnesium deficiency: Secondary | ICD-10-CM | POA: Diagnosis not present

## 2020-02-24 DIAGNOSIS — E785 Hyperlipidemia, unspecified: Secondary | ICD-10-CM | POA: Diagnosis not present

## 2020-02-24 DIAGNOSIS — E569 Vitamin deficiency, unspecified: Secondary | ICD-10-CM | POA: Diagnosis not present

## 2020-02-24 DIAGNOSIS — Z111 Encounter for screening for respiratory tuberculosis: Secondary | ICD-10-CM | POA: Diagnosis not present

## 2020-02-24 DIAGNOSIS — F339 Major depressive disorder, recurrent, unspecified: Secondary | ICD-10-CM | POA: Diagnosis not present

## 2020-02-24 DIAGNOSIS — E11621 Type 2 diabetes mellitus with foot ulcer: Secondary | ICD-10-CM | POA: Diagnosis not present

## 2020-02-24 DIAGNOSIS — Z89511 Acquired absence of right leg below knee: Secondary | ICD-10-CM | POA: Diagnosis not present

## 2020-02-24 DIAGNOSIS — I10 Essential (primary) hypertension: Secondary | ICD-10-CM | POA: Diagnosis not present

## 2020-02-24 DIAGNOSIS — K5900 Constipation, unspecified: Secondary | ICD-10-CM | POA: Diagnosis not present

## 2020-02-24 DIAGNOSIS — A499 Bacterial infection, unspecified: Secondary | ICD-10-CM | POA: Diagnosis not present

## 2020-02-24 DIAGNOSIS — I2510 Atherosclerotic heart disease of native coronary artery without angina pectoris: Secondary | ICD-10-CM | POA: Diagnosis not present

## 2020-02-24 DIAGNOSIS — M6281 Muscle weakness (generalized): Secondary | ICD-10-CM | POA: Diagnosis not present

## 2020-02-24 DIAGNOSIS — I2694 Multiple subsegmental pulmonary emboli without acute cor pulmonale: Secondary | ICD-10-CM | POA: Diagnosis not present

## 2020-02-24 DIAGNOSIS — F5101 Primary insomnia: Secondary | ICD-10-CM | POA: Diagnosis not present

## 2020-02-24 DIAGNOSIS — D509 Iron deficiency anemia, unspecified: Secondary | ICD-10-CM | POA: Diagnosis not present

## 2020-02-24 DIAGNOSIS — I5021 Acute systolic (congestive) heart failure: Secondary | ICD-10-CM | POA: Diagnosis not present

## 2020-02-24 DIAGNOSIS — R110 Nausea: Secondary | ICD-10-CM | POA: Diagnosis not present

## 2020-02-24 DIAGNOSIS — I739 Peripheral vascular disease, unspecified: Secondary | ICD-10-CM | POA: Diagnosis not present

## 2020-02-26 DIAGNOSIS — R5381 Other malaise: Secondary | ICD-10-CM | POA: Diagnosis not present

## 2020-03-01 DIAGNOSIS — K5900 Constipation, unspecified: Secondary | ICD-10-CM | POA: Diagnosis not present

## 2020-03-01 DIAGNOSIS — F5101 Primary insomnia: Secondary | ICD-10-CM | POA: Diagnosis not present

## 2020-03-01 DIAGNOSIS — R110 Nausea: Secondary | ICD-10-CM | POA: Diagnosis not present

## 2020-03-01 DIAGNOSIS — E785 Hyperlipidemia, unspecified: Secondary | ICD-10-CM | POA: Diagnosis not present

## 2020-03-01 DIAGNOSIS — F339 Major depressive disorder, recurrent, unspecified: Secondary | ICD-10-CM | POA: Diagnosis not present

## 2020-03-01 DIAGNOSIS — E569 Vitamin deficiency, unspecified: Secondary | ICD-10-CM | POA: Diagnosis not present

## 2020-03-01 DIAGNOSIS — I5021 Acute systolic (congestive) heart failure: Secondary | ICD-10-CM | POA: Diagnosis not present

## 2020-03-01 DIAGNOSIS — E612 Magnesium deficiency: Secondary | ICD-10-CM | POA: Diagnosis not present

## 2020-03-01 DIAGNOSIS — R52 Pain, unspecified: Secondary | ICD-10-CM | POA: Diagnosis not present

## 2020-03-01 DIAGNOSIS — I2510 Atherosclerotic heart disease of native coronary artery without angina pectoris: Secondary | ICD-10-CM | POA: Diagnosis not present

## 2020-03-01 DIAGNOSIS — M6281 Muscle weakness (generalized): Secondary | ICD-10-CM | POA: Diagnosis not present

## 2020-03-01 DIAGNOSIS — D509 Iron deficiency anemia, unspecified: Secondary | ICD-10-CM | POA: Diagnosis not present

## 2020-03-01 DIAGNOSIS — R2689 Other abnormalities of gait and mobility: Secondary | ICD-10-CM | POA: Diagnosis not present

## 2020-03-01 DIAGNOSIS — Z89511 Acquired absence of right leg below knee: Secondary | ICD-10-CM | POA: Diagnosis not present

## 2020-03-01 DIAGNOSIS — I2694 Multiple subsegmental pulmonary emboli without acute cor pulmonale: Secondary | ICD-10-CM | POA: Diagnosis not present

## 2020-03-01 DIAGNOSIS — I10 Essential (primary) hypertension: Secondary | ICD-10-CM | POA: Diagnosis not present

## 2020-03-01 DIAGNOSIS — Z111 Encounter for screening for respiratory tuberculosis: Secondary | ICD-10-CM | POA: Diagnosis not present

## 2020-03-01 DIAGNOSIS — A499 Bacterial infection, unspecified: Secondary | ICD-10-CM | POA: Diagnosis not present

## 2020-03-02 DIAGNOSIS — B353 Tinea pedis: Secondary | ICD-10-CM | POA: Diagnosis not present

## 2020-03-02 DIAGNOSIS — I110 Hypertensive heart disease with heart failure: Secondary | ICD-10-CM | POA: Diagnosis not present

## 2020-03-02 DIAGNOSIS — Z888 Allergy status to other drugs, medicaments and biological substances status: Secondary | ICD-10-CM | POA: Diagnosis not present

## 2020-03-02 DIAGNOSIS — Z89511 Acquired absence of right leg below knee: Secondary | ICD-10-CM | POA: Diagnosis not present

## 2020-03-02 DIAGNOSIS — M79672 Pain in left foot: Secondary | ICD-10-CM | POA: Diagnosis not present

## 2020-03-02 DIAGNOSIS — I2699 Other pulmonary embolism without acute cor pulmonale: Secondary | ICD-10-CM | POA: Diagnosis not present

## 2020-03-02 DIAGNOSIS — I2510 Atherosclerotic heart disease of native coronary artery without angina pectoris: Secondary | ICD-10-CM | POA: Diagnosis not present

## 2020-03-02 DIAGNOSIS — B351 Tinea unguium: Secondary | ICD-10-CM | POA: Diagnosis not present

## 2020-03-02 DIAGNOSIS — D367 Benign neoplasm of other specified sites: Secondary | ICD-10-CM | POA: Diagnosis not present

## 2020-03-02 DIAGNOSIS — E1151 Type 2 diabetes mellitus with diabetic peripheral angiopathy without gangrene: Secondary | ICD-10-CM | POA: Diagnosis not present

## 2020-03-02 DIAGNOSIS — I5020 Unspecified systolic (congestive) heart failure: Secondary | ICD-10-CM | POA: Diagnosis not present

## 2020-03-02 DIAGNOSIS — I82401 Acute embolism and thrombosis of unspecified deep veins of right lower extremity: Secondary | ICD-10-CM | POA: Diagnosis not present

## 2020-03-02 DIAGNOSIS — I252 Old myocardial infarction: Secondary | ICD-10-CM | POA: Diagnosis not present

## 2020-03-08 DIAGNOSIS — Z111 Encounter for screening for respiratory tuberculosis: Secondary | ICD-10-CM | POA: Diagnosis not present

## 2020-03-08 DIAGNOSIS — I5021 Acute systolic (congestive) heart failure: Secondary | ICD-10-CM | POA: Diagnosis not present

## 2020-03-08 DIAGNOSIS — K5900 Constipation, unspecified: Secondary | ICD-10-CM | POA: Diagnosis not present

## 2020-03-08 DIAGNOSIS — A499 Bacterial infection, unspecified: Secondary | ICD-10-CM | POA: Diagnosis not present

## 2020-03-08 DIAGNOSIS — D509 Iron deficiency anemia, unspecified: Secondary | ICD-10-CM | POA: Diagnosis not present

## 2020-03-08 DIAGNOSIS — Z89511 Acquired absence of right leg below knee: Secondary | ICD-10-CM | POA: Diagnosis not present

## 2020-03-08 DIAGNOSIS — I2694 Multiple subsegmental pulmonary emboli without acute cor pulmonale: Secondary | ICD-10-CM | POA: Diagnosis not present

## 2020-03-08 DIAGNOSIS — R2689 Other abnormalities of gait and mobility: Secondary | ICD-10-CM | POA: Diagnosis not present

## 2020-03-08 DIAGNOSIS — E569 Vitamin deficiency, unspecified: Secondary | ICD-10-CM | POA: Diagnosis not present

## 2020-03-08 DIAGNOSIS — E785 Hyperlipidemia, unspecified: Secondary | ICD-10-CM | POA: Diagnosis not present

## 2020-03-08 DIAGNOSIS — F5101 Primary insomnia: Secondary | ICD-10-CM | POA: Diagnosis not present

## 2020-03-08 DIAGNOSIS — I10 Essential (primary) hypertension: Secondary | ICD-10-CM | POA: Diagnosis not present

## 2020-03-08 DIAGNOSIS — I2510 Atherosclerotic heart disease of native coronary artery without angina pectoris: Secondary | ICD-10-CM | POA: Diagnosis not present

## 2020-03-08 DIAGNOSIS — F339 Major depressive disorder, recurrent, unspecified: Secondary | ICD-10-CM | POA: Diagnosis not present

## 2020-03-08 DIAGNOSIS — M6281 Muscle weakness (generalized): Secondary | ICD-10-CM | POA: Diagnosis not present

## 2020-03-08 DIAGNOSIS — R110 Nausea: Secondary | ICD-10-CM | POA: Diagnosis not present

## 2020-03-08 DIAGNOSIS — R52 Pain, unspecified: Secondary | ICD-10-CM | POA: Diagnosis not present

## 2020-03-08 DIAGNOSIS — E612 Magnesium deficiency: Secondary | ICD-10-CM | POA: Diagnosis not present

## 2020-03-11 DIAGNOSIS — R5381 Other malaise: Secondary | ICD-10-CM | POA: Diagnosis not present

## 2020-03-15 DIAGNOSIS — I2510 Atherosclerotic heart disease of native coronary artery without angina pectoris: Secondary | ICD-10-CM | POA: Diagnosis not present

## 2020-03-15 DIAGNOSIS — Z111 Encounter for screening for respiratory tuberculosis: Secondary | ICD-10-CM | POA: Diagnosis not present

## 2020-03-15 DIAGNOSIS — K5900 Constipation, unspecified: Secondary | ICD-10-CM | POA: Diagnosis not present

## 2020-03-15 DIAGNOSIS — D509 Iron deficiency anemia, unspecified: Secondary | ICD-10-CM | POA: Diagnosis not present

## 2020-03-15 DIAGNOSIS — I5021 Acute systolic (congestive) heart failure: Secondary | ICD-10-CM | POA: Diagnosis not present

## 2020-03-15 DIAGNOSIS — M6281 Muscle weakness (generalized): Secondary | ICD-10-CM | POA: Diagnosis not present

## 2020-03-15 DIAGNOSIS — F339 Major depressive disorder, recurrent, unspecified: Secondary | ICD-10-CM | POA: Diagnosis not present

## 2020-03-15 DIAGNOSIS — E612 Magnesium deficiency: Secondary | ICD-10-CM | POA: Diagnosis not present

## 2020-03-15 DIAGNOSIS — E785 Hyperlipidemia, unspecified: Secondary | ICD-10-CM | POA: Diagnosis not present

## 2020-03-15 DIAGNOSIS — Z89511 Acquired absence of right leg below knee: Secondary | ICD-10-CM | POA: Diagnosis not present

## 2020-03-15 DIAGNOSIS — I10 Essential (primary) hypertension: Secondary | ICD-10-CM | POA: Diagnosis not present

## 2020-03-15 DIAGNOSIS — R52 Pain, unspecified: Secondary | ICD-10-CM | POA: Diagnosis not present

## 2020-03-15 DIAGNOSIS — I2694 Multiple subsegmental pulmonary emboli without acute cor pulmonale: Secondary | ICD-10-CM | POA: Diagnosis not present

## 2020-03-15 DIAGNOSIS — R2689 Other abnormalities of gait and mobility: Secondary | ICD-10-CM | POA: Diagnosis not present

## 2020-03-15 DIAGNOSIS — E569 Vitamin deficiency, unspecified: Secondary | ICD-10-CM | POA: Diagnosis not present

## 2020-03-15 DIAGNOSIS — R110 Nausea: Secondary | ICD-10-CM | POA: Diagnosis not present

## 2020-03-15 DIAGNOSIS — A499 Bacterial infection, unspecified: Secondary | ICD-10-CM | POA: Diagnosis not present

## 2020-03-15 DIAGNOSIS — F5101 Primary insomnia: Secondary | ICD-10-CM | POA: Diagnosis not present

## 2020-03-18 DIAGNOSIS — R5381 Other malaise: Secondary | ICD-10-CM | POA: Diagnosis not present

## 2020-03-19 DIAGNOSIS — Z89511 Acquired absence of right leg below knee: Secondary | ICD-10-CM | POA: Diagnosis not present

## 2020-03-19 DIAGNOSIS — A499 Bacterial infection, unspecified: Secondary | ICD-10-CM | POA: Diagnosis not present

## 2020-03-19 DIAGNOSIS — I5021 Acute systolic (congestive) heart failure: Secondary | ICD-10-CM | POA: Diagnosis not present

## 2020-03-19 DIAGNOSIS — R41841 Cognitive communication deficit: Secondary | ICD-10-CM | POA: Diagnosis not present

## 2020-03-19 DIAGNOSIS — I2694 Multiple subsegmental pulmonary emboli without acute cor pulmonale: Secondary | ICD-10-CM | POA: Diagnosis not present

## 2020-03-19 DIAGNOSIS — M6281 Muscle weakness (generalized): Secondary | ICD-10-CM | POA: Diagnosis not present

## 2020-03-19 DIAGNOSIS — R2689 Other abnormalities of gait and mobility: Secondary | ICD-10-CM | POA: Diagnosis not present

## 2020-03-24 DIAGNOSIS — N179 Acute kidney failure, unspecified: Secondary | ICD-10-CM | POA: Diagnosis not present

## 2020-03-24 DIAGNOSIS — N181 Chronic kidney disease, stage 1: Secondary | ICD-10-CM | POA: Diagnosis not present

## 2020-03-24 DIAGNOSIS — R809 Proteinuria, unspecified: Secondary | ICD-10-CM | POA: Diagnosis not present

## 2020-03-25 DIAGNOSIS — R5381 Other malaise: Secondary | ICD-10-CM | POA: Diagnosis not present

## 2020-03-29 DIAGNOSIS — M6281 Muscle weakness (generalized): Secondary | ICD-10-CM | POA: Diagnosis not present

## 2020-03-29 DIAGNOSIS — A499 Bacterial infection, unspecified: Secondary | ICD-10-CM | POA: Diagnosis not present

## 2020-03-29 DIAGNOSIS — R2689 Other abnormalities of gait and mobility: Secondary | ICD-10-CM | POA: Diagnosis not present

## 2020-03-29 DIAGNOSIS — I5021 Acute systolic (congestive) heart failure: Secondary | ICD-10-CM | POA: Diagnosis not present

## 2020-03-29 DIAGNOSIS — Z89511 Acquired absence of right leg below knee: Secondary | ICD-10-CM | POA: Diagnosis not present

## 2020-03-29 DIAGNOSIS — R41841 Cognitive communication deficit: Secondary | ICD-10-CM | POA: Diagnosis not present

## 2020-03-29 DIAGNOSIS — I2694 Multiple subsegmental pulmonary emboli without acute cor pulmonale: Secondary | ICD-10-CM | POA: Diagnosis not present

## 2020-04-05 DIAGNOSIS — A499 Bacterial infection, unspecified: Secondary | ICD-10-CM | POA: Diagnosis not present

## 2020-04-05 DIAGNOSIS — R41841 Cognitive communication deficit: Secondary | ICD-10-CM | POA: Diagnosis not present

## 2020-04-05 DIAGNOSIS — I5021 Acute systolic (congestive) heart failure: Secondary | ICD-10-CM | POA: Diagnosis not present

## 2020-04-05 DIAGNOSIS — R2689 Other abnormalities of gait and mobility: Secondary | ICD-10-CM | POA: Diagnosis not present

## 2020-04-05 DIAGNOSIS — M6281 Muscle weakness (generalized): Secondary | ICD-10-CM | POA: Diagnosis not present

## 2020-04-05 DIAGNOSIS — I2694 Multiple subsegmental pulmonary emboli without acute cor pulmonale: Secondary | ICD-10-CM | POA: Diagnosis not present

## 2020-04-05 DIAGNOSIS — Z89511 Acquired absence of right leg below knee: Secondary | ICD-10-CM | POA: Diagnosis not present

## 2020-04-12 DIAGNOSIS — I5021 Acute systolic (congestive) heart failure: Secondary | ICD-10-CM | POA: Diagnosis not present

## 2020-04-12 DIAGNOSIS — R41841 Cognitive communication deficit: Secondary | ICD-10-CM | POA: Diagnosis not present

## 2020-04-12 DIAGNOSIS — R2689 Other abnormalities of gait and mobility: Secondary | ICD-10-CM | POA: Diagnosis not present

## 2020-04-12 DIAGNOSIS — Z89511 Acquired absence of right leg below knee: Secondary | ICD-10-CM | POA: Diagnosis not present

## 2020-04-12 DIAGNOSIS — A499 Bacterial infection, unspecified: Secondary | ICD-10-CM | POA: Diagnosis not present

## 2020-04-12 DIAGNOSIS — I2694 Multiple subsegmental pulmonary emboli without acute cor pulmonale: Secondary | ICD-10-CM | POA: Diagnosis not present

## 2020-04-12 DIAGNOSIS — M6281 Muscle weakness (generalized): Secondary | ICD-10-CM | POA: Diagnosis not present

## 2020-04-12 DIAGNOSIS — S88119A Complete traumatic amputation at level between knee and ankle, unspecified lower leg, initial encounter: Secondary | ICD-10-CM | POA: Diagnosis not present

## 2020-04-12 DIAGNOSIS — Z681 Body mass index (BMI) 19 or less, adult: Secondary | ICD-10-CM | POA: Diagnosis not present

## 2020-04-15 DIAGNOSIS — R5381 Other malaise: Secondary | ICD-10-CM | POA: Diagnosis not present

## 2020-04-19 DIAGNOSIS — R2689 Other abnormalities of gait and mobility: Secondary | ICD-10-CM | POA: Diagnosis not present

## 2020-04-19 DIAGNOSIS — A499 Bacterial infection, unspecified: Secondary | ICD-10-CM | POA: Diagnosis not present

## 2020-04-19 DIAGNOSIS — R41841 Cognitive communication deficit: Secondary | ICD-10-CM | POA: Diagnosis not present

## 2020-04-19 DIAGNOSIS — I5021 Acute systolic (congestive) heart failure: Secondary | ICD-10-CM | POA: Diagnosis not present

## 2020-04-19 DIAGNOSIS — I2694 Multiple subsegmental pulmonary emboli without acute cor pulmonale: Secondary | ICD-10-CM | POA: Diagnosis not present

## 2020-04-19 DIAGNOSIS — M6281 Muscle weakness (generalized): Secondary | ICD-10-CM | POA: Diagnosis not present

## 2020-04-19 DIAGNOSIS — Z89511 Acquired absence of right leg below knee: Secondary | ICD-10-CM | POA: Diagnosis not present

## 2020-04-22 DIAGNOSIS — R5381 Other malaise: Secondary | ICD-10-CM | POA: Diagnosis not present

## 2020-04-23 DIAGNOSIS — R5381 Other malaise: Secondary | ICD-10-CM | POA: Diagnosis not present

## 2020-04-26 DIAGNOSIS — R2689 Other abnormalities of gait and mobility: Secondary | ICD-10-CM | POA: Diagnosis not present

## 2020-04-26 DIAGNOSIS — I2694 Multiple subsegmental pulmonary emboli without acute cor pulmonale: Secondary | ICD-10-CM | POA: Diagnosis not present

## 2020-04-26 DIAGNOSIS — R41841 Cognitive communication deficit: Secondary | ICD-10-CM | POA: Diagnosis not present

## 2020-04-26 DIAGNOSIS — I5021 Acute systolic (congestive) heart failure: Secondary | ICD-10-CM | POA: Diagnosis not present

## 2020-04-26 DIAGNOSIS — A499 Bacterial infection, unspecified: Secondary | ICD-10-CM | POA: Diagnosis not present

## 2020-04-26 DIAGNOSIS — M6281 Muscle weakness (generalized): Secondary | ICD-10-CM | POA: Diagnosis not present

## 2020-04-26 DIAGNOSIS — Z89511 Acquired absence of right leg below knee: Secondary | ICD-10-CM | POA: Diagnosis not present

## 2020-05-03 DIAGNOSIS — I2694 Multiple subsegmental pulmonary emboli without acute cor pulmonale: Secondary | ICD-10-CM | POA: Diagnosis not present

## 2020-05-03 DIAGNOSIS — Z89511 Acquired absence of right leg below knee: Secondary | ICD-10-CM | POA: Diagnosis not present

## 2020-05-03 DIAGNOSIS — A499 Bacterial infection, unspecified: Secondary | ICD-10-CM | POA: Diagnosis not present

## 2020-05-03 DIAGNOSIS — I5021 Acute systolic (congestive) heart failure: Secondary | ICD-10-CM | POA: Diagnosis not present

## 2020-05-03 DIAGNOSIS — R41841 Cognitive communication deficit: Secondary | ICD-10-CM | POA: Diagnosis not present

## 2020-05-03 DIAGNOSIS — M6281 Muscle weakness (generalized): Secondary | ICD-10-CM | POA: Diagnosis not present

## 2020-05-03 DIAGNOSIS — R2689 Other abnormalities of gait and mobility: Secondary | ICD-10-CM | POA: Diagnosis not present

## 2020-05-04 DIAGNOSIS — R5381 Other malaise: Secondary | ICD-10-CM | POA: Diagnosis not present

## 2020-05-18 DIAGNOSIS — R5381 Other malaise: Secondary | ICD-10-CM | POA: Diagnosis not present

## 2020-05-20 DIAGNOSIS — Z89511 Acquired absence of right leg below knee: Secondary | ICD-10-CM | POA: Diagnosis not present

## 2020-05-27 DIAGNOSIS — R5381 Other malaise: Secondary | ICD-10-CM | POA: Diagnosis not present

## 2020-06-01 DIAGNOSIS — R5381 Other malaise: Secondary | ICD-10-CM | POA: Diagnosis not present

## 2020-06-08 DIAGNOSIS — R5381 Other malaise: Secondary | ICD-10-CM | POA: Diagnosis not present

## 2020-07-15 ENCOUNTER — Other Ambulatory Visit: Payer: Self-pay

## 2020-07-15 ENCOUNTER — Encounter: Payer: Self-pay | Admitting: Family Medicine

## 2020-07-15 ENCOUNTER — Ambulatory Visit: Payer: Medicaid Other | Admitting: Family Medicine

## 2020-07-15 VITALS — BP 120/88 | HR 72 | Ht 65.0 in | Wt 112.0 lb

## 2020-07-15 DIAGNOSIS — E7801 Familial hypercholesterolemia: Secondary | ICD-10-CM | POA: Diagnosis not present

## 2020-07-15 DIAGNOSIS — I739 Peripheral vascular disease, unspecified: Secondary | ICD-10-CM

## 2020-07-15 DIAGNOSIS — I10 Essential (primary) hypertension: Secondary | ICD-10-CM | POA: Diagnosis not present

## 2020-07-15 DIAGNOSIS — Z89511 Acquired absence of right leg below knee: Secondary | ICD-10-CM

## 2020-07-15 DIAGNOSIS — I25118 Atherosclerotic heart disease of native coronary artery with other forms of angina pectoris: Secondary | ICD-10-CM

## 2020-07-15 DIAGNOSIS — N870 Mild cervical dysplasia: Secondary | ICD-10-CM

## 2020-07-15 DIAGNOSIS — Z0000 Encounter for general adult medical examination without abnormal findings: Secondary | ICD-10-CM

## 2020-07-15 DIAGNOSIS — G63 Polyneuropathy in diseases classified elsewhere: Secondary | ICD-10-CM

## 2020-07-15 DIAGNOSIS — E1151 Type 2 diabetes mellitus with diabetic peripheral angiopathy without gangrene: Secondary | ICD-10-CM | POA: Diagnosis not present

## 2020-07-15 DIAGNOSIS — Z794 Long term (current) use of insulin: Secondary | ICD-10-CM | POA: Diagnosis not present

## 2020-07-15 NOTE — Progress Notes (Signed)
Date:  07/15/2020   Name:  [name] [name]   DOB:  11/14/1955   MRN:  [id number]   Chief Complaint: [place], Hypertension, and Hyperlipidemia  Patient is a 65 year old female who presents for a establish care exam. The patient reports the following problems: endocrine/cardiac/vascular/ . Health maintenance has been reviewed cervical dysplasia  Hypertension This is a chronic problem. The current episode started more than 1 year ago. The problem has been gradually improving since onset. The problem is controlled. Pertinent negatives include no anxiety, blurred vision, chest pain, headaches, malaise/fatigue, neck pain, orthopnea, palpitations, peripheral edema, PND, shortness of breath or sweats. There are no associated agents to hypertension. Risk factors for coronary artery disease include smoking/tobacco exposure, diabetes mellitus and dyslipidemia. Past treatments include beta blockers, diuretics and direct vasodilators. The current treatment provides moderate improvement. Identifiable causes of hypertension include chronic renal disease.  Hyperlipidemia This is a chronic problem. The current episode started more than 1 year ago. Exacerbating diseases include chronic renal disease and diabetes. Factors aggravating her hyperlipidemia include thiazides. Pertinent negatives include no chest pain, myalgias or shortness of breath. Current antihyperlipidemic treatment includes statins.    Lab Results  Component Value Date   CREATININE 1.23 (H) 08/01/2019   BUN 22 08/01/2019   NA 136 08/01/2019   K 4.6 08/01/2019   CL 101 08/01/2019   CO2 26 08/01/2019   Lab Results  Component Value Date   CHOL 209 (H) 04/01/2014   HDL 43 04/01/2014   LDLCALC 147 (H) 04/01/2014   TRIG 94 04/01/2014   No results found for: TSH Lab Results  Component Value Date   HGBA1C 10.9 (H) 04/01/2014   Lab Results  Component Value Date   WBC 7.9 08/01/2019   HGB 11.3 (L) 08/01/2019   HCT 34.8 (L)  08/01/2019   MCV 86.8 08/01/2019   PLT 374 08/01/2019   Lab Results  Component Value Date   ALT 33 08/01/2019   AST 33 08/01/2019   ALKPHOS 256 (H) 08/01/2019   BILITOT 0.4 08/01/2019     Review of Systems  Constitutional: Negative.  Negative for chills, fatigue, fever, malaise/fatigue and unexpected weight change.  HENT: Negative for congestion, ear discharge, ear pain, rhinorrhea, sinus pressure, sneezing and sore throat.   Eyes: Negative for blurred vision, double vision, photophobia, pain, discharge, redness and itching.  Respiratory: Negative for cough, shortness of breath, wheezing and stridor.   Cardiovascular: Negative for chest pain, palpitations, orthopnea and PND.  Gastrointestinal: Negative for abdominal pain, blood in stool, constipation, diarrhea, nausea and vomiting.  Endocrine: Negative for cold intolerance, heat intolerance, polydipsia, polyphagia and polyuria.  Genitourinary: Negative for dysuria, flank pain, frequency, hematuria, menstrual problem, pelvic pain, urgency, vaginal bleeding and vaginal discharge.  Musculoskeletal: Negative for arthralgias, back pain, myalgias and neck pain.  Skin: Negative for rash.  Allergic/Immunologic: Negative for environmental allergies and food allergies.  Neurological: Negative for dizziness, weakness, light-headedness, numbness and headaches.  Hematological: Negative for adenopathy. Does not bruise/bleed easily.  Psychiatric/Behavioral: Negative for dysphoric mood. The patient is not nervous/anxious.     There are no problems to display for this patient.   Allergies  Allergen Reactions   Lisinopril Other (See Comments) and Swelling    Recurrent AKI and hyperkalemia when initiation attempted.    No past surgical history on file.  Social History   Tobacco Use   Smoking status: Current Every Day Smoker    Types: Cigars   Smokeless tobacco: Never  Used  Vaping Use   Vaping Use: Never used  Substance Use Topics    Alcohol use: Never   Drug use: Not Currently     Medication list has been reviewed and updated.  Current Meds  Medication Sig   apixaban (ELIQUIS) 5 MG TABS tablet Take 1 tablet by mouth in the morning and at bedtime.   aspirin 81 MG chewable tablet Chew by mouth.   atorvastatin (LIPITOR) 80 MG tablet Take 80 mg by mouth daily.   DULoxetine (CYMBALTA) 20 MG capsule Take 2 capsules by mouth in the morning and at bedtime.   empagliflozin (JARDIANCE) 10 MG TABS tablet Take 1 tablet by mouth daily.   ferrous sulfate 325 (65 FE) MG tablet Take 325 mg by mouth daily with breakfast.   furosemide (LASIX) 40 MG tablet Take 1 tablet by mouth daily.   gabapentin (NEURONTIN) 300 MG capsule Take 300 mg by mouth 3 (three) times daily. neurology   hydrALAZINE (APRESOLINE) 100 MG tablet Take 1 tablet by mouth in the morning, at noon, and at bedtime.   insulin glargine (LANTUS) 100 UNIT/ML injection Please inject 16 units SQ each night (Patient taking differently: Inject 5 Units into the skin daily. Please inject 16 units SQ each night)   insulin lispro (HUMALOG) 100 UNIT/ML injection Inject 8 Units into the skin in the morning, at noon, and at bedtime.   isosorbide dinitrate (ISORDIL) 30 MG tablet Take 1 tablet by mouth in the morning, at noon, and at bedtime.   magnesium oxide (MAG-OX) 400 MG tablet Take 1 tablet by mouth in the morning and at bedtime.   metFORMIN (GLUCOPHAGE-XR) 500 MG 24 hr tablet Take 500 mg by mouth 2 (two) times daily.   metoprolol succinate (TOPROL-XL) 50 MG 24 hr tablet Take 50 mg by mouth daily.   Multiple Vitamins-Minerals (MULTIVITAMIN WOMEN 50+ PO) Take 1 tablet by mouth at bedtime.   spironolactone (ALDACTONE) 25 MG tablet Take 1 tablet by mouth daily.   [DISCONTINUED] atorvastatin (LIPITOR) 10 MG tablet Take 10 mg by mouth daily.   [DISCONTINUED] atorvastatin (LIPITOR) 20 MG tablet Take 20 mg by mouth daily.    PHQ 2/9 Scores 07/15/2020  PHQ - 2  Score 1  PHQ- 9 Score 1    GAD 7 : Generalized Anxiety Score 07/15/2020  Nervous, Anxious, on Edge 0  Control/stop worrying 0  Worry too much - different things 0  Trouble relaxing 0  Restless 0  Easily annoyed or irritable 0  Afraid - awful might happen 0  Total GAD 7 Score 0    BP Readings from Last 3 Encounters:  07/15/20 120/88  08/01/19 140/88  10/19/18 (!) 146/94    Physical Exam Vitals and nursing note reviewed.  Constitutional:      Appearance: She is well-developed and well-nourished.  HENT:     Head: Normocephalic.     Right Ear: Tympanic membrane, ear canal and external ear normal. There is no impacted cerumen.     Left Ear: Tympanic membrane, ear canal and external ear normal. There is no impacted cerumen.     Nose: Nose normal. No congestion or rhinorrhea.     Mouth/Throat:     Mouth: Oropharynx is clear and moist. Mucous membranes are moist.  Eyes:     General: Lids are everted, no foreign bodies appreciated. No scleral icterus.       Left eye: No foreign body or hordeolum.     Extraocular Movements: EOM normal.  Conjunctiva/sclera: Conjunctivae normal.     Right eye: Right conjunctiva is not injected.     Left eye: Left conjunctiva is not injected.     Pupils: Pupils are equal, round, and reactive to light.  Neck:     Thyroid: No thyromegaly.     Vascular: No JVD.     Trachea: No tracheal deviation.  Cardiovascular:     Rate and Rhythm: Normal rate and regular rhythm.     Pulses: Intact distal pulses.     Heart sounds: Normal heart sounds. No murmur heard. No friction rub. No gallop.   Pulmonary:     Effort: Pulmonary effort is normal. No respiratory distress.     Breath sounds: Normal breath sounds. No wheezing, rhonchi or rales.  Abdominal:     General: Bowel sounds are normal.     Palpations: Abdomen is soft. There is no hepatosplenomegaly or mass.     Tenderness: There is no abdominal tenderness. There is no guarding or rebound.   Musculoskeletal:        General: No tenderness or edema. Normal range of motion.     Cervical back: Normal range of motion and neck supple.  Lymphadenopathy:     Cervical: No cervical adenopathy.  Skin:    General: Skin is warm.     Findings: No bruising, erythema or rash.  Neurological:     Mental Status: She is alert and oriented to person, place, and time.     Cranial Nerves: No cranial nerve deficit.     Deep Tendon Reflexes: Strength normal. Reflexes normal.  Psychiatric:        Mood and Affect: Mood and affect normal. Mood is not anxious or depressed.     Wt Readings from Last 3 Encounters:  07/15/20 112 lb (50.8 kg)  08/01/19 105 lb (47.6 kg)  10/19/18 100 lb (45.4 kg)    BP 120/88    Pulse 72    Ht 5\' 5"  (1.651 m)    Wt 112 lb (50.8 kg)    BMI 18.64 kg/m   Assessment and Plan:                                           Patient's chart was reviewed up in rather extensive medical history and to the best of our ability previous labs imaging and care everywhere.                                                          1. Encounter for medical examination to establish care Patient establishing care with a rather extensive history as noted above and the level of noncompliance either due to not knowing of upcoming appointments or inability to attend.  2. Primary hypertension Chronic.  Controlled.  Stable.  Today is 120/88.  Blood pressure is controlled by hydralazine 100 mg 3 times a day and spironolactone/furosemide diuretic combination as well as metoprolol XL 50 mg daily.  Will recheck in 2 weeks  3. Familial hypercholesterolemia Chronic.  Controlled.  Stable.  Continue atorvastatin 80 mg once a day.  Patient will return fasting in 2 weeks for checkup current lipid status.  4. Type 2 diabetes mellitus with diabetic peripheral angiopathy  without gangrene, with long-term current use of insulin (HCC) Chronic.  Controlled.  Stable.  Patient is not checking her own blood sugars we  will be referring to [facility]ocrinology at [facility] in the meantime patient will be continuing her regimen of Lantus 16 units at bedtime and Humalog 1 to 5 units with meals as well as Metformin XR 500 mg once a day.  Patient also will continue her Jardiance 10 mg once a day.  Hopefully we can have endocrinology involved in the following weeks. - Ambulatory referral to Endocrinology  5. Coronary artery disease of native heart with stable angina pectoris, unspecified vessel or lesion type (HCC) Chronic.  Controlled.  Stable.  With a history of CHF as well.  Patient is followed by cardiology and referral has been made to [facility] cardiology.  In the meantime patient has been refilled on medications including Jardiance 10 mg daily as well as Isordil 30 mg 3 times daily. - Ambulatory referral to Cardiology  6. PAD (peripheral artery disease) (HCC) Chronic.  Controlled.  Stable.  Patient missed her appointment with podiatry at [facility] this is been reestablished and she has an upcoming appointment with Dr. Posey Pronto on February 17th.  Patient also has an upcoming appointment with vascular surgery at [facility] as well - Ambulatory referral to Podiatry - Ambulatory referral to Vascular Surgery  7. Polyneuropathy associated with underlying disease ([place]) History of polyneuropathy for which she is currently on gabapentin and this has been refilled to current dosing of 300 mg 3 times a day.  8. Cervical dysplasia, mild History of cervical dysplasia and appointment has been placed with [facility] for follow-up.  9. Hx of BKA, right ([place]) Status post BKA right but patient is having some issues with the fitting of her prosthesis.  We will refer to podiatry for reevaluation of current prosthesis fitting and associated dressing. - Ambulatory referral to Podiatry - Ambulatory referral to Vascular Surgery  Patient has been given appointment in 2 weeks to follow-up with vascular reviewed her current status.

## 2020-07-16 ENCOUNTER — Encounter: Payer: Self-pay | Admitting: Family Medicine

## 2020-07-20 ENCOUNTER — Telehealth: Payer: Self-pay

## 2020-07-20 NOTE — Telephone Encounter (Signed)
Copied from [place] [phone]. Topic: General - Other >> Jul 20, 2020 12:53 PM Keene Breath wrote: Reason for CRM: Patient called to ask the nurse to call her with the address of the two appts. She has at [facility].  CB# [phone]

## 2020-07-20 NOTE — Telephone Encounter (Signed)
[place], [place] it is where [employer] are

## 2020-07-21 DIAGNOSIS — R809 Proteinuria, unspecified: Secondary | ICD-10-CM | POA: Diagnosis not present

## 2020-07-21 DIAGNOSIS — Z681 Body mass index (BMI) 19 or less, adult: Secondary | ICD-10-CM | POA: Diagnosis not present

## 2020-07-21 DIAGNOSIS — N179 Acute kidney failure, unspecified: Secondary | ICD-10-CM | POA: Diagnosis not present

## 2020-07-21 DIAGNOSIS — N181 Chronic kidney disease, stage 1: Secondary | ICD-10-CM | POA: Diagnosis not present

## 2020-07-21 DIAGNOSIS — I150 Renovascular hypertension: Secondary | ICD-10-CM | POA: Diagnosis not present

## 2020-07-21 DIAGNOSIS — E8809 Other disorders of plasma-protein metabolism, not elsewhere classified: Secondary | ICD-10-CM | POA: Diagnosis not present

## 2020-07-29 ENCOUNTER — Ambulatory Visit: Payer: Medicaid Other | Admitting: Family Medicine

## 2020-08-03 ENCOUNTER — Ambulatory Visit: Payer: Medicaid Other | Admitting: Family Medicine

## 2020-08-04 DIAGNOSIS — I7090 Unspecified atherosclerosis: Secondary | ICD-10-CM | POA: Diagnosis not present

## 2020-08-04 DIAGNOSIS — Z681 Body mass index (BMI) 19 or less, adult: Secondary | ICD-10-CM | POA: Diagnosis not present

## 2020-08-05 DIAGNOSIS — B351 Tinea unguium: Secondary | ICD-10-CM | POA: Diagnosis not present

## 2020-08-05 DIAGNOSIS — D2272 Melanocytic nevi of left lower limb, including hip: Secondary | ICD-10-CM | POA: Diagnosis not present

## 2020-08-05 DIAGNOSIS — I2510 Atherosclerotic heart disease of native coronary artery without angina pectoris: Secondary | ICD-10-CM | POA: Diagnosis not present

## 2020-08-05 DIAGNOSIS — I10 Essential (primary) hypertension: Secondary | ICD-10-CM | POA: Diagnosis not present

## 2020-08-05 DIAGNOSIS — I739 Peripheral vascular disease, unspecified: Secondary | ICD-10-CM | POA: Diagnosis not present

## 2020-08-05 DIAGNOSIS — I82401 Acute embolism and thrombosis of unspecified deep veins of right lower extremity: Secondary | ICD-10-CM | POA: Diagnosis not present

## 2020-08-05 DIAGNOSIS — B353 Tinea pedis: Secondary | ICD-10-CM | POA: Diagnosis not present

## 2020-08-05 DIAGNOSIS — E1151 Type 2 diabetes mellitus with diabetic peripheral angiopathy without gangrene: Secondary | ICD-10-CM | POA: Diagnosis not present

## 2020-08-05 DIAGNOSIS — Z888 Allergy status to other drugs, medicaments and biological substances status: Secondary | ICD-10-CM | POA: Diagnosis not present

## 2020-08-05 DIAGNOSIS — I252 Old myocardial infarction: Secondary | ICD-10-CM | POA: Diagnosis not present

## 2020-08-05 DIAGNOSIS — Z89511 Acquired absence of right leg below knee: Secondary | ICD-10-CM | POA: Diagnosis not present

## 2020-08-10 ENCOUNTER — Encounter: Payer: Self-pay | Admitting: Family Medicine

## 2020-08-10 ENCOUNTER — Ambulatory Visit: Payer: Medicaid Other | Admitting: Family Medicine

## 2020-08-10 ENCOUNTER — Other Ambulatory Visit: Payer: Self-pay

## 2020-08-10 VITALS — BP 128/80 | HR 76 | Ht 65.0 in | Wt 111.0 lb

## 2020-08-10 DIAGNOSIS — F324 Major depressive disorder, single episode, in partial remission: Secondary | ICD-10-CM

## 2020-08-10 DIAGNOSIS — E1151 Type 2 diabetes mellitus with diabetic peripheral angiopathy without gangrene: Secondary | ICD-10-CM | POA: Diagnosis not present

## 2020-08-10 DIAGNOSIS — I10 Essential (primary) hypertension: Secondary | ICD-10-CM

## 2020-08-10 DIAGNOSIS — Z7901 Long term (current) use of anticoagulants: Secondary | ICD-10-CM | POA: Diagnosis not present

## 2020-08-10 DIAGNOSIS — E7801 Familial hypercholesterolemia: Secondary | ICD-10-CM

## 2020-08-10 DIAGNOSIS — Z794 Long term (current) use of insulin: Secondary | ICD-10-CM | POA: Diagnosis not present

## 2020-08-10 DIAGNOSIS — I25118 Atherosclerotic heart disease of native coronary artery with other forms of angina pectoris: Secondary | ICD-10-CM | POA: Diagnosis not present

## 2020-08-10 DIAGNOSIS — I739 Peripheral vascular disease, unspecified: Secondary | ICD-10-CM

## 2020-08-10 MED ORDER — HYDRALAZINE HCL 100 MG PO TABS: 100.0000 mg | ORAL_TABLET | Freq: Three times a day (TID) | ORAL | 1 refills | Status: DC

## 2020-08-10 MED ORDER — ISOSORBIDE DINITRATE 30 MG PO TABS: 30.0000 mg | ORAL_TABLET | Freq: Three times a day (TID) | ORAL | 1 refills | Status: DC

## 2020-08-10 MED ORDER — SPIRONOLACTONE 25 MG PO TABS: 25.0000 mg | ORAL_TABLET | Freq: Every day | ORAL | 1 refills | Status: DC

## 2020-08-10 MED ORDER — METFORMIN HCL ER 500 MG PO TB24: 500.0000 mg | ORAL_TABLET | Freq: Two times a day (BID) | ORAL | 1 refills | Status: DC

## 2020-08-10 MED ORDER — INSULIN GLARGINE 100 UNIT/ML ~~LOC~~ SOLN: SUBCUTANEOUS | 2 refills | Status: DC

## 2020-08-10 MED ORDER — APIXABAN 5 MG PO TABS: 5.0000 mg | ORAL_TABLET | Freq: Two times a day (BID) | ORAL | 1 refills | Status: DC

## 2020-08-10 MED ORDER — DULOXETINE HCL 20 MG PO CPEP: 40.0000 mg | ORAL_CAPSULE | Freq: Two times a day (BID) | ORAL | 1 refills | Status: DC

## 2020-08-10 MED ORDER — ATORVASTATIN CALCIUM 80 MG PO TABS: 80.0000 mg | ORAL_TABLET | Freq: Every day | ORAL | 1 refills | Status: DC

## 2020-08-10 MED ORDER — METOPROLOL SUCCINATE ER 50 MG PO TB24: 50.0000 mg | ORAL_TABLET | Freq: Every day | ORAL | 1 refills | Status: DC

## 2020-08-10 MED ORDER — FUROSEMIDE 40 MG PO TABS: 40.0000 mg | ORAL_TABLET | Freq: Every day | ORAL | 1 refills | Status: DC

## 2020-08-10 MED ORDER — EMPAGLIFLOZIN 10 MG PO TABS: 10.0000 mg | ORAL_TABLET | Freq: Every day | ORAL | 1 refills | Status: DC

## 2020-08-10 MED ORDER — INSULIN LISPRO 100 UNIT/ML ~~LOC~~ SOLN: 8.0000 [IU] | Freq: Three times a day (TID) | SUBCUTANEOUS | 6 refills | Status: DC

## 2020-08-10 NOTE — Progress Notes (Signed)
Date:  08/10/2020   Name:  [name] [name]   DOB:  [birth year]   MRN:  [id number]   Chief Complaint: Diabetes, Hyperlipidemia, Hypertension, and Depression  Patient says she needs all her medicines refilled.  Diabetes She presents for her follow-up diabetic visit. She has type 2 diabetes mellitus. Her disease course has been stable. There are no hypoglycemic associated symptoms. Pertinent negatives for hypoglycemia include no dizziness, headaches, nervousness/anxiousness or sweats. There are no diabetic associated symptoms. Pertinent negatives for diabetes include no blurred vision, no chest pain, no fatigue, no foot paresthesias, no foot ulcerations, no polydipsia, no polyphagia, no polyuria, no visual change, no weakness and no weight loss. There are no hypoglycemic complications. Symptoms are stable. There are no diabetic complications. Risk factors for coronary artery disease include diabetes mellitus, dyslipidemia and hypertension. An ACE inhibitor/angiotensin II receptor blocker is being taken.  Hyperlipidemia This is a chronic problem. The current episode started more than 1 year ago. Recent lipid tests were reviewed and are variable. Pertinent negatives include no chest pain, focal sensory loss, focal weakness, leg pain, myalgias or shortness of breath. The current treatment provides moderate improvement of lipids. There are no compliance problems.   Hypertension This is a chronic problem. The current episode started more than 1 year ago. The problem has been waxing and waning since onset. The problem is controlled. Pertinent negatives include no anxiety, blurred vision, chest pain, headaches, malaise/fatigue, neck pain, orthopnea, palpitations, peripheral edema, PND, shortness of breath or sweats. Risk factors for coronary artery disease include dyslipidemia and diabetes mellitus. Past treatments include ACE inhibitors. The current treatment provides moderate improvement.  Depression         This is a chronic problem.  The onset quality is gradual.   The problem occurs intermittently.  Associated symptoms include no decreased concentration, no fatigue, no helplessness, no hopelessness, does not have insomnia, not irritable, no restlessness, no decreased interest, no appetite change, no body aches, no myalgias, no headaches, no indigestion, not sad and no suicidal ideas.   Pertinent negatives include no anxiety.   Lab Results  Component Value Date   CREATININE 1.23 (H) 08/01/2019   BUN 22 08/01/2019   NA 136 08/01/2019   K 4.6 08/01/2019   CL 101 08/01/2019   CO2 26 08/01/2019   Lab Results  Component Value Date   CHOL 209 (H) 04/01/2014   HDL 43 04/01/2014   LDLCALC 147 (H) 04/01/2014   TRIG 94 04/01/2014   No results found for: TSH Lab Results  Component Value Date   HGBA1C 10.9 (H) 04/01/2014   Lab Results  Component Value Date   WBC 7.9 08/01/2019   HGB 11.3 (L) 08/01/2019   HCT 34.8 (L) 08/01/2019   MCV 86.8 08/01/2019   PLT 374 08/01/2019   Lab Results  Component Value Date   ALT 33 08/01/2019   AST 33 08/01/2019   ALKPHOS 256 (H) 08/01/2019   BILITOT 0.4 08/01/2019     Review of Systems  Constitutional: Negative.  Negative for appetite change, chills, fatigue, fever, malaise/fatigue, unexpected weight change and weight loss.  HENT: Negative for congestion, ear discharge, ear pain, rhinorrhea, sinus pressure, sneezing and sore throat.   Eyes: Negative for blurred vision, double vision, photophobia, pain, discharge, redness and itching.  Respiratory: Negative for cough, shortness of breath, wheezing and stridor.   Cardiovascular: Negative for chest pain, palpitations, orthopnea and PND.  Gastrointestinal: Negative for abdominal pain, blood in stool, constipation, diarrhea, nausea  and vomiting.  Endocrine: Negative for cold intolerance, heat intolerance, polydipsia, polyphagia and polyuria.  Genitourinary: Negative for dysuria, flank pain, frequency,  hematuria, menstrual problem, pelvic pain, urgency, vaginal bleeding and vaginal discharge.  Musculoskeletal: Negative for arthralgias, back pain, myalgias and neck pain.  Skin: Negative for rash.  Allergic/Immunologic: Negative for environmental allergies and food allergies.  Neurological: Negative for dizziness, focal weakness, weakness, light-headedness, numbness and headaches.  Hematological: Negative for adenopathy. Does not bruise/bleed easily.  Psychiatric/Behavioral: Positive for depression. Negative for decreased concentration, dysphoric mood and suicidal ideas. The patient is not nervous/anxious and does not have insomnia.     There are no problems to display for this patient.   Allergies  Allergen Reactions  . Lisinopril Other (See Comments) and Swelling    Recurrent AKI and hyperkalemia when initiation attempted.    No past surgical history on file.  Social History   Tobacco Use  . Smoking status: Current Every Day Smoker    Types: Cigars  . Smokeless tobacco: Never Used  Vaping Use  . Vaping Use: Never used  Substance Use Topics  . Alcohol use: Never  . Drug use: Not Currently     Medication list has been reviewed and updated.  Current Meds  Medication Sig  . apixaban (ELIQUIS) 5 MG TABS tablet Take 1 tablet by mouth in the morning and at bedtime.  Marland Kitchen atorvastatin (LIPITOR) 80 MG tablet Take 80 mg by mouth daily.  . DULoxetine (CYMBALTA) 20 MG capsule Take 2 capsules by mouth in the morning and at bedtime.  . empagliflozin (JARDIANCE) 10 MG TABS tablet Take 1 tablet by mouth daily.  . furosemide (LASIX) 40 MG tablet Take 1 tablet by mouth daily.  Marland Kitchen gabapentin (NEURONTIN) 300 MG capsule Take 300 mg by mouth 3 (three) times daily. neurology  . hydrALAZINE (APRESOLINE) 100 MG tablet Take 1 tablet by mouth in the morning, at noon, and at bedtime.  . insulin lispro (HUMALOG) 100 UNIT/ML injection Inject 8 Units into the skin in the morning, at noon, and at bedtime.   . isosorbide dinitrate (ISORDIL) 30 MG tablet Take 1 tablet by mouth in the morning, at noon, and at bedtime.  . metFORMIN (GLUCOPHAGE-XR) 500 MG 24 hr tablet Take 500 mg by mouth 2 (two) times daily.  . metoprolol succinate (TOPROL-XL) 50 MG 24 hr tablet Take 50 mg by mouth daily.  . Multiple Vitamins-Minerals (MULTIVITAMIN WOMEN 50+ PO) Take 1 tablet by mouth at bedtime.  Marland Kitchen spironolactone (ALDACTONE) 25 MG tablet Take 1 tablet by mouth daily.    PHQ 2/9 Scores 08/10/2020 07/15/2020  PHQ - 2 Score 3 1  PHQ- 9 Score 4 1    GAD 7 : Generalized Anxiety Score 08/10/2020 07/15/2020  Nervous, Anxious, on Edge 0 0  Control/stop worrying 0 0  Worry too much - different things 1 0  Trouble relaxing 0 0  Restless 0 0  Easily annoyed or irritable 0 0  Afraid - awful might happen 0 0  Total GAD 7 Score 1 0    BP Readings from Last 3 Encounters:  08/10/20 128/80  07/15/20 120/88  08/01/19 140/88    Physical Exam Vitals and nursing note reviewed.  Constitutional:      General: She is not irritable.    Appearance: She is well-developed and well-nourished.  HENT:     Head: Normocephalic.     Right Ear: Tympanic membrane and external ear normal.     Left Ear: Tympanic membrane and external  ear normal.     Mouth/Throat:     Mouth: Oropharynx is clear and moist.  Eyes:     General: Lids are everted, no foreign bodies appreciated. No scleral icterus.       Left eye: No foreign body or hordeolum.     Extraocular Movements: EOM normal.     Conjunctiva/sclera: Conjunctivae normal.     Right eye: Right conjunctiva is not injected.     Left eye: Left conjunctiva is not injected.     Pupils: Pupils are equal, round, and reactive to light.  Neck:     Thyroid: No thyromegaly.     Vascular: No JVD.     Trachea: No tracheal deviation.  Cardiovascular:     Rate and Rhythm: Normal rate and regular rhythm.     Pulses: Intact distal pulses.     Heart sounds: Normal heart sounds. No murmur  heard. No friction rub. No gallop.   Pulmonary:     Effort: Pulmonary effort is normal. No respiratory distress.     Breath sounds: Normal breath sounds. No wheezing or rales.  Abdominal:     General: Bowel sounds are normal.     Palpations: Abdomen is soft. There is no hepatosplenomegaly or mass.     Tenderness: There is no abdominal tenderness. There is no guarding or rebound.  Musculoskeletal:        General: No tenderness or edema. Normal range of motion.     Cervical back: Normal range of motion and neck supple.  Lymphadenopathy:     Cervical: No cervical adenopathy.  Skin:    General: Skin is warm.     Findings: No rash.  Neurological:     Mental Status: She is alert and oriented to person, place, and time.     Cranial Nerves: No cranial nerve deficit.     Deep Tendon Reflexes: Strength normal. Reflexes normal.  Psychiatric:        Mood and Affect: Mood and affect normal. Mood is not anxious or depressed.     Wt Readings from Last 3 Encounters:  08/10/20 111 lb (50.3 kg)  07/15/20 112 lb (50.8 kg)  08/01/19 105 lb (47.6 kg)    BP 128/80   Pulse 76   Ht 5\' 5"  (1.651 m)   Wt 111 lb (50.3 kg)   BMI 18.47 kg/m   Assessment and Plan:  1. Type 2 diabetes mellitus with diabetic peripheral angiopathy without gangrene, with long-term current use of insulin (HCC) Chronic.  Controlled.  Stable.  Continue Humalog 8 units in a.m. noon and bedtime and Lantus 16 units nightly.  Metformin XR 500 mg twice a day.  Jardiance 10 mg once a day.  Will check A1c for current level of control. - insulin lispro (HUMALOG) 100 UNIT/ML injection; Inject 0.08 mLs (8 Units total) into the skin in the morning, at noon, and at bedtime.  Dispense: 10 mL; Refill: 6 - metFORMIN (GLUCOPHAGE-XR) 500 MG 24 hr tablet; Take 1 tablet (500 mg total) by mouth 2 (two) times daily.  Dispense: 180 tablet; Refill: 1 - empagliflozin (JARDIANCE) 10 MG TABS tablet; Take 1 tablet (10 mg total) by mouth daily.   Dispense: 90 tablet; Refill: 1 - Renal Function Panel - Hemoglobin A1c  2. Primary hypertension Chronic.  Controlled.  Stable.  Blood pressure today is 128/80.  We will continue Toprol-XL 50 mg once a day, Lasix 40 mg once a day, hydralazine 100 mg 1 3 times a day and renal function  panel to assess electrolytes and GFR. - metoprolol succinate (TOPROL-XL) 50 MG 24 hr tablet; Take 1 tablet (50 mg total) by mouth daily.  Dispense: 90 tablet; Refill: 1 - furosemide (LASIX) 40 MG tablet; Take 1 tablet (40 mg total) by mouth daily.  Dispense: 90 tablet; Refill: 1 - hydrALAZINE (APRESOLINE) 100 MG tablet; Take 1 tablet (100 mg total) by mouth 3 (three) times daily.  Dispense: 270 tablet; Refill: 1 - Renal Function Panel  3. Coronary artery disease of native heart with stable angina pectoris, unspecified vessel or lesion type ([place]) As noted above with the addition of isosorbide 30 mg twice a day and spironolactone 25 mg once a day. - metoprolol succinate (TOPROL-XL) 50 MG 24 hr tablet; Take 1 tablet (50 mg total) by mouth daily.  Dispense: 90 tablet; Refill: 1 - furosemide (LASIX) 40 MG tablet; Take 1 tablet (40 mg total) by mouth daily.  Dispense: 90 tablet; Refill: 1 - spironolactone (ALDACTONE) 25 MG tablet; Take 1 tablet (25 mg total) by mouth daily.  Dispense: 90 tablet; Refill: 1 - hydrALAZINE (APRESOLINE) 100 MG tablet; Take 1 tablet (100 mg total) by mouth 3 (three) times daily.  Dispense: 270 tablet; Refill: 1 - isosorbide dinitrate (ISORDIL) 30 MG tablet; Take 1 tablet (30 mg total) by mouth in the morning, at noon, and at bedtime.  Dispense: 180 tablet; Refill: 1  4. Familial hypercholesterolemia Chronic.  Controlled.  Stable.  Continue Lipitor 80 mg once a day. - atorvastatin (LIPITOR) 80 MG tablet; Take 1 tablet (80 mg total) by mouth daily.  Dispense: 90 tablet; Refill: 1 - Lipid Panel With LDL/HDL Ratio  5. PAD (peripheral artery disease) (HCC) Continue hydralazine as previously  prescribed.  6. Major depressive disorder in partial remission, unspecified whether recurrent (HCC) Chronic.  Controlled.  Stable.  Continue duloxetine 20 mg 2 tablets for total of 40 mg once in the morning and at bedtime. - DULoxetine (CYMBALTA) 20 MG capsule; Take 2 capsules (40 mg total) by mouth in the morning and at bedtime.  Dispense: 90 capsule; Refill: 1  7. Current use of anticoagulant therapy Continue Eliquis for anticoagulant therapy 5 mg in the morning and at bedtime.  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 - apixaban (ELIQUIS) 5 MG TABS tablet; Take 1 tablet (5 mg total) by mouth in the morning and at bedtime.  Dispense: 180 tablet; Refill: 1

## 2020-08-11 ENCOUNTER — Telehealth: Payer: Self-pay | Admitting: Family Medicine

## 2020-08-11 LAB — LIPID PANEL WITH LDL/HDL RATIO
Cholesterol, Total: 196 mg/dL (ref 100–199)
HDL: 45 mg/dL (ref >39)
LDL Chol Calc (NIH): 111 mg/dL — ABNORMAL HIGH (ref 0–99)
LDL/HDL Ratio: 2.5 ratio (ref 0.0–3.2)
Triglycerides: 228 mg/dL — ABNORMAL HIGH (ref 0–149)
VLDL Cholesterol Cal: 40 mg/dL (ref 5–40)

## 2020-08-11 LAB — RENAL FUNCTION PANEL
Albumin: 3.2 g/dL — ABNORMAL LOW (ref 3.8–4.8)
BUN/Creatinine Ratio: 33 — ABNORMAL HIGH (ref 12–28)
BUN: 39 mg/dL — ABNORMAL HIGH (ref 8–27)
CO2: 17 mmol/L — ABNORMAL LOW (ref 20–29)
Calcium: 9.2 mg/dL (ref 8.7–10.3)
Chloride: 101 mmol/L (ref 96–106)
Creatinine, Ser: 1.18 mg/dL — ABNORMAL HIGH (ref 0.57–1.00)
GFR calc Af Amer: 57 mL/min/{1.73_m2} — ABNORMAL LOW (ref >59)
GFR calc non Af Amer: 50 mL/min/{1.73_m2} — ABNORMAL LOW (ref >59)
Glucose: 485 mg/dL — ABNORMAL HIGH (ref 65–99)
Phosphorus: 4 mg/dL (ref 3.0–4.3)
Potassium: 4.6 mmol/L (ref 3.5–5.2)
Sodium: 136 mmol/L (ref 134–144)

## 2020-08-11 LAB — HEMOGLOBIN A1C
Est. average glucose Bld gHb Est-mCnc: 258 mg/dL
Hgb A1c MFr Bld: 10.6 % — ABNORMAL HIGH (ref 4.8–5.6)

## 2020-08-11 NOTE — Telephone Encounter (Signed)
Pt called in for assistance. Pt says that she was prescribed gabapentin (NEURONTIN) 300 MG capsule in the hospital. Pt says that she's in need of a refill but is unsure of which provider to requested from PCP or her urologist. Pt says that she doesn't have a neurologist. Pt would like further assist.    Pharmacy :  [id number] - [address], [place] Phone:  [phone]  Fax:  [phone]     [phone]

## 2020-08-12 NOTE — Telephone Encounter (Signed)
Left message to inform patient.

## 2020-08-12 NOTE — Telephone Encounter (Signed)
Please call her with this info- she needs to call her vascular doctor for this. It is Dr Wilhelmenia Blase- [phone] is his number

## 2020-08-23 ENCOUNTER — Telehealth: Payer: Self-pay

## 2020-08-23 NOTE — Telephone Encounter (Signed)
I received a "no show" letter fron [facility] on pt today. They have been "unable to reschedule the patient."

## 2020-08-24 ENCOUNTER — Telehealth: Payer: Self-pay

## 2020-08-24 NOTE — Telephone Encounter (Signed)
Please call her and tell her she would need to get this from a urologist who has evaluated her for urinary incontinence

## 2020-08-24 NOTE — Telephone Encounter (Signed)
Copied from [place] [phone]. Topic: General - Other >> Aug 23, 2020  5:21 PM Pawlus, Brayton Layman A wrote: Reason for CRM: Pt called in stating she needs a doctors order for activewear pull ups for adults, Pt stated the supplier needs a doctors note. Please advise.

## 2020-08-24 NOTE — Telephone Encounter (Signed)
Patient is aware 

## 2020-08-31 ENCOUNTER — Ambulatory Visit: Payer: Self-pay

## 2020-08-31 ENCOUNTER — Telehealth: Payer: Self-pay

## 2020-08-31 NOTE — Telephone Encounter (Unsigned)
Copied from [place] [id number]. Topic: General - Call Back - No Documentation >> Aug 31, 2020  4:07 PM Erick Blinks wrote: Pt wants active style briefs, nephrology states that her PCP needs to write the Rx.  Best contact: [phone]

## 2020-08-31 NOTE — Telephone Encounter (Signed)
Please call pt and tell her, as stated before, urology, not nephrology, would be the ones to determine if she needs incontinent supplies. We have never evaluated her for this. I can put in a referral for her to see urology and she can discuss this with them. Let me know if she wants the referral

## 2020-09-01 ENCOUNTER — Other Ambulatory Visit: Payer: Self-pay

## 2020-09-01 DIAGNOSIS — N3941 Urge incontinence: Secondary | ICD-10-CM

## 2020-09-01 NOTE — Progress Notes (Unsigned)
Ref to urology placed

## 2020-09-01 NOTE — Telephone Encounter (Signed)
Pt wants referral to urology

## 2020-09-01 NOTE — Telephone Encounter (Signed)
I put in

## 2020-09-07 ENCOUNTER — Ambulatory Visit: Payer: Medicaid Other | Admitting: Urology

## 2020-09-13 ENCOUNTER — Other Ambulatory Visit: Payer: Self-pay | Admitting: *Deleted

## 2020-09-13 DIAGNOSIS — N3941 Urge incontinence: Secondary | ICD-10-CM

## 2020-09-14 ENCOUNTER — Other Ambulatory Visit: Payer: Self-pay

## 2020-09-14 ENCOUNTER — Ambulatory Visit (INDEPENDENT_AMBULATORY_CARE_PROVIDER_SITE_OTHER): Payer: Medicaid Other | Admitting: Urology

## 2020-09-14 ENCOUNTER — Other Ambulatory Visit
Admission: RE | Admit: 2020-09-14 | Discharge: 2020-09-14 | Disposition: A | Discharge status: Home / Self Care | Payer: Medicaid Other | Attending: Urology | Admitting: Urology

## 2020-09-14 ENCOUNTER — Encounter: Payer: Self-pay | Admitting: Urology

## 2020-09-14 VITALS — BP 115/69 | HR 106 | Ht 65.0 in | Wt 111.0 lb

## 2020-09-14 DIAGNOSIS — N3281 Overactive bladder: Secondary | ICD-10-CM

## 2020-09-14 DIAGNOSIS — N3941 Urge incontinence: Secondary | ICD-10-CM

## 2020-09-14 DIAGNOSIS — N3000 Acute cystitis without hematuria: Secondary | ICD-10-CM

## 2020-09-14 LAB — URINALYSIS, COMPLETE (UACMP) WITH MICROSCOPIC
Bilirubin Urine: NEGATIVE
Glucose, UA: >1000 mg/dL — AB
Leukocytes,Ua: NEGATIVE
Nitrite: NEGATIVE
Protein, ur: >300 mg/dL — AB
RBC / HPF: >50 RBC/hpf (ref 0–5)
Specific Gravity, Urine: 1.015 (ref 1.005–1.030)
WBC, UA: >50 WBC/hpf (ref 0–5)
pH: 6.5 (ref 5.0–8.0)

## 2020-09-14 LAB — BLADDER SCAN AMB NON-IMAGING

## 2020-09-14 MED ORDER — SULFAMETHOXAZOLE-TRIMETHOPRIM 800-160 MG PO TABS: 1.0000 | ORAL_TABLET | Freq: Two times a day (BID) | ORAL | 0 refills | Status: DC

## 2020-09-14 NOTE — Progress Notes (Signed)
   09/14/20 1:40 PM   [name] [name] [birth year] [id number]  CC: Urinary frequency, urgency, mixed incontinence  HPI: I saw [name] [name] in urology clinic for the above issues today.  She is a comorbid 65 year old female with poorly controlled diabetes(hemoglobin A1c 10.6) who reports a few months of worsening urinary symptoms with frequency, urgency, nocturia 4 times per night, and urge and stress incontinence.  It sounds like she is primarily bothered by the urgency and urge incontinence.  She is having to wear depends.  She also will leak urine when she sneezes or coughs.  She has some mild pelvic pain, but denies any specific dysuria.  She denies any gross hematuria.  She drinks water and a diet Dr. Malachi Bonds daily.  She also takes furosemide 40 mg daily.  She smokes cigars.  Urinalysis today concerning for infection with many bacteria, greater than 50 RBCs, 0-5 squamous cells, greater than 50 WBCs, greater than 1000 glucose, negative leukocytes, negative nitrite.   PMH: Past Medical History:  Diagnosis Date  . Diabetes mellitus without complication ([name])     Family History: Family History  Problem Relation Age of Onset  . Heart disease Mother   . Diabetes Mother   . Heart disease Father   . Diabetes Father     Social History:  reports that she has been smoking cigars. She has never used smokeless tobacco. She reports previous drug use. She reports that she does not drink alcohol.  Physical Exam: BP 115/69   Pulse (!) 106   Ht 5\' 5"  (1.651 m)   Wt 111 lb (50.3 kg)   BMI 18.47 kg/m    Constitutional:  Alert and oriented, No acute distress. Cardiovascular: No clubbing, cyanosis, or edema. Respiratory: Normal respiratory effort, no increased work of breathing. GI: Abdomen is soft, nontender, nondistended, no abdominal masses  Laboratory Data: Reviewed, see HPI  Pertinent Imaging: None to review  Assessment & Plan:   65 year old female with pelvic pressure/pain, urgency,  frequency, urge incontinence, and mild stress incontinence over the last few months.  Urinalysis today grossly concerning for infection.  She also has a number of comorbidities that likely are contributing to her urinary symptoms including poorly controlled diabetes with a hemoglobin A1c of 10.6, high-dose diuretics, and diet soda consumption.  We discussed possible etiologies at length including acute UTI, overactive bladder, and the relationship between diabetes and bladder symptoms.  I recommend treating her for an acute UTI and sending urine for culture, with close follow-up in 2 to 3 weeks for symptom check and repeat UA.  If she has persistent microscopic hematuria at that time would consider further evaluation work-up, and if she has persistent OAB symptoms would recommend a trial of oxybutynin 10 mg XL at that time as well.  -Bactrim DS twice daily for acute UTI, follow-up culture results -RTC 2 to 3 weeks for symptom check and repeat UA, consider OAB medication at that time if persistent symptoms and benign UA, and further evaluation if persistent microscopic hematuria or pyuria  Nickolas Madrid, MD 09/14/2020  [facility] [address] [place], [place] [address] [phone]

## 2020-09-14 NOTE — Patient Instructions (Signed)
Improving your diabetes control and cutting back on diet sodas will also improve your urinary symptoms  Overactive Bladder, Adult  Overactive bladder is a condition in which a person has a sudden and frequent need to urinate. A person might also leak urine if he or she cannot get to the bathroom fast enough (urinary incontinence). Sometimes, symptoms can interfere with work or social activities. What are the causes? Overactive bladder is associated with poor nerve signals between your bladder and your brain. Your bladder may get the signal to empty before it is full. You may also have very sensitive muscles that make your bladder squeeze too soon. This condition may also be caused by other factors, such as:  Medical conditions: ? Urinary tract infection. ? Infection of nearby tissues. ? Prostate enlargement. ? Bladder stones, inflammation, or tumors. ? Diabetes. ? Muscle or nerve weakness, especially from these conditions:  A spinal cord injury.  Stroke.  Multiple sclerosis.  Parkinson's disease.  Other causes: ? Surgery on the uterus or urethra. ? Drinking too much caffeine or alcohol. ? Certain medicines, especially those that eliminate extra fluid in the body (diuretics). ? Constipation. What increases the risk? You may be at greater risk for overactive bladder if you:  Are an older adult.  Smoke.  Are going through menopause.  Have prostate problems.  Have a neurological disease, such as stroke, dementia, Parkinson's disease, or multiple sclerosis (MS).  Eat or drink alcohol, spicy food, caffeine, and other things that irritate the bladder.  Are overweight or obese. What are the signs or symptoms? Symptoms of this condition include a sudden, strong urge to urinate. Other symptoms include:  Leaking urine.  Urinating 8 or more times a day.  Waking up to urinate 2 or more times overnight. How is this diagnosed? This condition may be diagnosed based on:  Your  symptoms and medical history.  A physical exam.  Blood or urine tests to check for possible causes, such as infection. You may also need to see a health care provider who specializes in urinary tract problems. This is called a urologist. How is this treated? Treatment for overactive bladder depends on the cause of your condition and whether it is mild or severe. Treatment may include:  Bladder training, such as: ? Learning to control the urge to urinate by following a schedule to urinate at regular intervals. ? Doing Kegel exercises to strengthen the pelvic floor muscles that support your bladder.  Special devices, such as: ? Biofeedback. This uses sensors to help you become aware of your body's signals. ? Electrical stimulation. This uses electrodes placed inside the body (implanted) or outside the body. These electrodes send gentle pulses of electricity to strengthen the nerves or muscles that control the bladder. ? Women may use a plastic device, called a pessary, that fits into the vagina and supports the bladder.  Medicines, such as: ? Antibiotics to treat bladder infection. ? Antispasmodics to stop the bladder from releasing urine at the wrong time. ? Tricyclic antidepressants to relax bladder muscles. ? Injections of botulinum toxin type A directly into the bladder tissue to relax bladder muscles.  Surgery, such as: ? A device may be implanted to help manage the nerve signals that control urination. ? An electrode may be implanted to stimulate electrical signals in the bladder. ? A procedure may be done to change the shape of the bladder. This is done only in very severe cases. Follow these instructions at home: Eating and drinking  Make diet or lifestyle changes recommended by your health care provider. These may include: ? Drinking fluids throughout the day and not only with meals. ? Cutting down on caffeine or alcohol. ? Eating a healthy and balanced diet to prevent  constipation. This may include:  Choosing foods that are high in fiber, such as beans, whole grains, and fresh fruits and vegetables.  Limiting foods that are high in fat and processed sugars, such as fried and sweet foods.   Lifestyle  Lose weight if needed.  Do not use any products that contain nicotine or tobacco. These include cigarettes, chewing tobacco, and vaping devices, such as e-cigarettes. If you need help quitting, ask your health care provider.   General instructions  Take over-the-counter and prescription medicines only as told by your health care provider.  If you were prescribed an antibiotic medicine, take it as told by your health care provider. Do not stop taking the antibiotic even if you start to feel better.  Use any implants or pessary as told by your health care provider.  If needed, wear pads to absorb urine leakage.  Keep a log to track how much and when you drink, and when you need to urinate. This will help your health care provider monitor your condition.  Keep all follow-up visits. This is important. Contact a health care provider if:  You have a fever or chills.  Your symptoms do not get better with treatment.  Your pain and discomfort get worse.  You have more frequent urges to urinate. Get help right away if:  You are not able to control your bladder. Summary  Overactive bladder refers to a condition in which a person has a sudden and frequent need to urinate.  Several conditions may lead to an overactive bladder.  Treatment for overactive bladder depends on the cause and severity of your condition.  Making lifestyle changes, doing Kegel exercises, keeping a log, and taking medicines can help with this condition. This information is not intended to replace advice given to you by your health care provider. Make sure you discuss any questions you have with your health care provider. Document Revised: 02/23/2020 Document Reviewed:  02/23/2020 Elsevier Patient Education  2021 [employer].

## 2020-09-16 ENCOUNTER — Telehealth: Payer: Self-pay

## 2020-09-16 NOTE — Telephone Encounter (Signed)
Called- she has received the info about her endocrinologist prior to my call

## 2020-09-16 NOTE — Telephone Encounter (Signed)
Copied from [place] [phone]. Topic: General - Other >> Sep 16, 2020  1:14 PM Celene Kras wrote: Reason for CRM: Pt calling and is requesting to have a nurse give her a call back to give her details on her endocrinologist. Please advise.

## 2020-09-17 ENCOUNTER — Telehealth: Payer: Self-pay

## 2020-09-17 LAB — URINE CULTURE: Culture: >=100000 — AB

## 2020-09-17 NOTE — Telephone Encounter (Signed)
-----   Message from Billey Co, MD sent at 09/17/2020 12:28 PM EDT ----- She is on the correct antibiotic for her UTI.  She should have follow-up in 2 to 3 weeks with me, thanks  Nickolas Madrid, MD 09/17/2020

## 2020-09-17 NOTE — Telephone Encounter (Signed)
Called pt informed her of the information below. Pt gave verbal understanding. States that she is feeling better. Follow up appt scheduled.

## 2020-10-05 ENCOUNTER — Ambulatory Visit: Payer: Self-pay | Admitting: Urology

## 2020-10-05 ENCOUNTER — Other Ambulatory Visit: Payer: Self-pay | Admitting: *Deleted

## 2020-10-05 DIAGNOSIS — N3281 Overactive bladder: Secondary | ICD-10-CM

## 2020-10-06 ENCOUNTER — Other Ambulatory Visit: Payer: Self-pay | Admitting: Family Medicine

## 2020-10-06 DIAGNOSIS — F324 Major depressive disorder, single episode, in partial remission: Secondary | ICD-10-CM

## 2020-10-06 NOTE — Telephone Encounter (Signed)
Requested Prescriptions  Pending Prescriptions Disp Refills  . DULoxetine (CYMBALTA) 20 MG capsule [Pharmacy Med Name: DULOXETINE HCL DR 20 MG CAP] 120 capsule 2    Sig: TAKE 2 CAPSULES (40 MG TOTAL) BY MOUTH IN THE MORNING AND AT BEDTIME.     Psychiatry: Antidepressants - SNRI Passed - 10/06/2020  5:55 PM      Passed - Last BP in normal range    BP Readings from Last 1 Encounters:  09/14/20 115/69         Passed - Valid encounter within last 6 months    Recent Outpatient Visits          1 month ago Type 2 diabetes mellitus with diabetic peripheral angiopathy without gangrene, with long-term current use of insulin ([place])   [facility] [name], MD   2 months ago Encounter for medical examination to establish care   [place], MD      Future Appointments            In 4 months [name], MD [facility], [facility]

## 2020-10-18 ENCOUNTER — Telehealth: Payer: Self-pay

## 2020-10-18 NOTE — Telephone Encounter (Signed)
Called and left message for pt to call and reschedule the appt with [facility] [phone]

## 2020-10-19 ENCOUNTER — Encounter: Payer: Self-pay | Admitting: Urology

## 2020-10-19 ENCOUNTER — Other Ambulatory Visit: Payer: Self-pay

## 2020-10-19 ENCOUNTER — Other Ambulatory Visit: Payer: Self-pay | Admitting: *Deleted

## 2020-10-19 ENCOUNTER — Other Ambulatory Visit
Admission: RE | Admit: 2020-10-19 | Discharge: 2020-10-19 | Disposition: A | Discharge status: Home / Self Care | Payer: Medicaid Other | Attending: Urology | Admitting: Urology

## 2020-10-19 ENCOUNTER — Ambulatory Visit (INDEPENDENT_AMBULATORY_CARE_PROVIDER_SITE_OTHER): Payer: Medicaid Other | Admitting: Urology

## 2020-10-19 VITALS — BP 122/74 | HR 84 | Ht 65.0 in | Wt 114.0 lb

## 2020-10-19 DIAGNOSIS — N3000 Acute cystitis without hematuria: Secondary | ICD-10-CM

## 2020-10-19 DIAGNOSIS — N3281 Overactive bladder: Secondary | ICD-10-CM | POA: Insufficient documentation

## 2020-10-19 DIAGNOSIS — N390 Urinary tract infection, site not specified: Secondary | ICD-10-CM

## 2020-10-19 DIAGNOSIS — R3121 Asymptomatic microscopic hematuria: Secondary | ICD-10-CM

## 2020-10-19 LAB — URINALYSIS, COMPLETE (UACMP) WITH MICROSCOPIC
Bilirubin Urine: NEGATIVE
Glucose, UA: >1000 mg/dL — AB
Ketones, ur: NEGATIVE mg/dL
Nitrite: NEGATIVE
Protein, ur: >300 mg/dL — AB
RBC / HPF: >50 RBC/hpf (ref 0–5)
Specific Gravity, Urine: 1.01 (ref 1.005–1.030)
pH: 6 (ref 5.0–8.0)

## 2020-10-19 NOTE — Progress Notes (Signed)
   10/19/2020 2:00 PM   JOSAPHINE SHIMAMOTO 09-05-1955 [id number]  Reason for visit: Follow up UTI/OAB/microscopic hematuria  HPI: 65 year old female who was referred for urinary urgency, frequency, and lower pelvic pain.  At that visit on 09/14/2020 she was found to have a Klebsiella UTI, and this was treated with antibiotics which resolved her pelvic pain and improved her urinary urgency and frequency.  She still has some urgency, but improved from prior.  She really denies any urinary complaints today, or any gross hematuria.  She also has poorly controlled diabetes.  Urinalysis today shows 0-5 squamous cells, 20-50 WBCs, greater than 50 RBCs, few bacteria, and WBC clumps.  Sent for culture.  We discussed her persistent concerning urinalysis despite her improvement in her urinary symptoms.  Urine was sent for culture today.  Consider another course of antibiotics if positive, and likely pursue CT urogram and cystoscopy in the future based on her extensive smoking history and persistent microscopic hematuria despite treatment of UTI with culture appropriate antibiotics.  -Follow-up urine culture, call with results -Likely pursue CT urogram and cystoscopy with persistent microscopic hematuria and smoking history   Billey Co, MD  [facility] [address] [place], [place] [address] [phone]

## 2020-10-21 LAB — URINE CULTURE: Culture: >=100000 — AB

## 2020-10-22 ENCOUNTER — Telehealth: Payer: Self-pay

## 2020-10-22 DIAGNOSIS — R3121 Asymptomatic microscopic hematuria: Secondary | ICD-10-CM

## 2020-10-22 DIAGNOSIS — I739 Peripheral vascular disease, unspecified: Secondary | ICD-10-CM | POA: Diagnosis not present

## 2020-10-22 DIAGNOSIS — B961 Klebsiella pneumoniae [K. pneumoniae] as the cause of diseases classified elsewhere: Secondary | ICD-10-CM

## 2020-10-22 DIAGNOSIS — N3090 Cystitis, unspecified without hematuria: Secondary | ICD-10-CM

## 2020-10-22 DIAGNOSIS — N390 Urinary tract infection, site not specified: Secondary | ICD-10-CM

## 2020-10-22 MED ORDER — SULFAMETHOXAZOLE-TRIMETHOPRIM 800-160 MG PO TABS
1.0000 | ORAL_TABLET | Freq: Two times a day (BID) | ORAL | 0 refills | Status: DC
Start: 2020-10-22 — End: 2020-11-18

## 2020-10-22 NOTE — Telephone Encounter (Signed)
-----   Message from Billey Co, MD sent at 10/21/2020  5:01 PM EDT ----- Urine culture showed another UTI.  Recommend Bactrim DS twice daily x7 days, as well as CT hematuria on follow-up for cystoscopy to evaluate why she is having recurrent infections despite antibiotics  Nickolas Madrid, MD 10/21/2020

## 2020-10-22 NOTE — Telephone Encounter (Signed)
Called pt informed her of the information below. Pt gave verbal understanding. RX sent. CT scan ordered. Cysto scheduled.

## 2020-10-26 LAB — MISC LABCORP TEST (SEND OUT): Labcorp test code: 86884

## 2020-11-01 ENCOUNTER — Telehealth: Payer: Self-pay | Admitting: Family Medicine

## 2020-11-01 DIAGNOSIS — E1151 Type 2 diabetes mellitus with diabetic peripheral angiopathy without gangrene: Secondary | ICD-10-CM

## 2020-11-01 DIAGNOSIS — Z794 Long term (current) use of insulin: Secondary | ICD-10-CM

## 2020-11-01 MED ORDER — INSULIN GLARGINE 100 UNIT/ML ~~LOC~~ SOLN: SUBCUTANEOUS | 2 refills | Status: DC

## 2020-11-01 MED ORDER — INSULIN LISPRO 100 UNIT/ML ~~LOC~~ SOLN: 8.0000 [IU] | Freq: Three times a day (TID) | SUBCUTANEOUS | 2 refills | Status: DC

## 2020-11-01 NOTE — Telephone Encounter (Signed)
Apolonio Schneiders, from Pharmacy, calling stating that she is needing to have clarification on the pts prescriptions, Humalog and the Lantus. She states that she is needing to know if these are pens or the vials. Please advise.      [id number] [address], [place] [address]  Phone: [phone] Fax: [phone]  Hours: Not open 24 hours

## 2020-11-01 NOTE — Telephone Encounter (Signed)
Pt needs pen needles for her insulin glargine (LANTUS) AND  insulin lispro (HUMALOG).  The pen needles were never sent in with the insulin. Pt states how is she supposed to use her insulin without the pen needles?   [id number] - [place], [place]

## 2020-11-01 NOTE — Telephone Encounter (Signed)
Spoke to [employer]

## 2020-11-05 NOTE — Telephone Encounter (Signed)
Sent in pen needles

## 2020-11-05 NOTE — Telephone Encounter (Signed)
Pt called stating that she received these prescriptions, but that she is needing to have the needles for the pens. Please advise.

## 2020-11-10 ENCOUNTER — Other Ambulatory Visit: Payer: Self-pay | Admitting: Urology

## 2020-11-17 ENCOUNTER — Other Ambulatory Visit: Payer: Self-pay

## 2020-11-17 ENCOUNTER — Ambulatory Visit
Admission: RE | Admit: 2020-11-17 | Discharge: 2020-11-17 | Disposition: A | Discharge status: Home / Self Care | Payer: Medicaid Other | Source: Ambulatory Visit | Attending: Urology | Admitting: Urology

## 2020-11-17 ENCOUNTER — Telehealth: Payer: Self-pay

## 2020-11-17 DIAGNOSIS — N390 Urinary tract infection, site not specified: Secondary | ICD-10-CM

## 2020-11-17 DIAGNOSIS — R3121 Asymptomatic microscopic hematuria: Secondary | ICD-10-CM | POA: Insufficient documentation

## 2020-11-17 DIAGNOSIS — R3129 Other microscopic hematuria: Secondary | ICD-10-CM | POA: Diagnosis not present

## 2020-11-17 DIAGNOSIS — R102 Pelvic and perineal pain: Secondary | ICD-10-CM | POA: Diagnosis not present

## 2020-11-17 DIAGNOSIS — K5730 Diverticulosis of large intestine without perforation or abscess without bleeding: Secondary | ICD-10-CM | POA: Diagnosis not present

## 2020-11-17 DIAGNOSIS — N281 Cyst of kidney, acquired: Secondary | ICD-10-CM | POA: Diagnosis not present

## 2020-11-17 LAB — POCT I-STAT CREATININE: Creatinine, Ser: 1.5 mg/dL — ABNORMAL HIGH (ref 0.44–1.00)

## 2020-11-17 MED ORDER — IOHEXOL 300 MG/ML  SOLN
150.0000 mL | Freq: Once | INTRAMUSCULAR | Status: AC | PRN
  Administered 2020-11-17: 100 mL via INTRAVENOUS

## 2020-11-17 NOTE — Telephone Encounter (Signed)
Called endocrinology at [facility] and was able to speak to someone concerning this pt's appt. She cancelled her 10/12/20 appt and rescheduled for August 9th @ 3:10. I called and spoke to pt and explained to her that it was very important that she keeps this August appt with [facility] as Ronnald Ramp feels it is best she be followed by endocrinology. We will send in test strips for now. Pt understands

## 2020-11-17 NOTE — Telephone Encounter (Signed)
I don't know what strips she is using, so I called pharmacy and they don't have anything on file for her either. Can you call her after lunch and tell her we need to know what machine or strips she needs/ name of them or it. I just spoke to her and she was at CT so I couldn't talk to her. Thank you

## 2020-11-17 NOTE — Telephone Encounter (Signed)
Pt needs test strips called in? Please advise

## 2020-11-18 ENCOUNTER — Telehealth: Payer: Self-pay

## 2020-11-18 ENCOUNTER — Ambulatory Visit (INDEPENDENT_AMBULATORY_CARE_PROVIDER_SITE_OTHER): Payer: Medicaid Other | Admitting: Urology

## 2020-11-18 ENCOUNTER — Encounter: Payer: Self-pay | Admitting: Urology

## 2020-11-18 VITALS — BP 137/70 | HR 75 | Ht 65.0 in | Wt 114.0 lb

## 2020-11-18 DIAGNOSIS — N390 Urinary tract infection, site not specified: Secondary | ICD-10-CM

## 2020-11-18 DIAGNOSIS — R918 Other nonspecific abnormal finding of lung field: Secondary | ICD-10-CM

## 2020-11-18 NOTE — Telephone Encounter (Signed)
Incoming call from [facility] giving [place] call report on this pt's CT scan. Please see report in Epic.

## 2020-11-18 NOTE — Progress Notes (Signed)
Cystoscopy Procedure Note:  Indication: Recurrent UTIs  Recently completed course of Bactrim for Klebsiella UTI, she denies any urinary symptoms at this time and urgency/frequency/pelvic pain has resolved after antibiotics.  After informed consent and discussion of the procedure and its risks, [name] [name] was positioned and prepped in the standard fashion. Cystoscopy was performed with a flexible cystoscope. The urethra, bladder neck and entire bladder was visualized in a standard fashion.  There was mild erythema at the posterior bladder wall, unclear if this is secondary to recent UTI or CIS.  Directed cytology sent.  The ureteral orifices were visualized in their normal location and orientation.  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  I reviewed these findings with the patient, need for further work-up by oncology.  Findings: Mild posterior wall bladder erythema-unclear if secondary to recent UTI versus CIS  Assessment and Plan: Follow-up urine cytology, if positive or suspicious pursue bladder biopsy Referral placed oncology regarding 2 cm lung mass- Dr Charlestine Night in Epic to help coordinate   Nickolas Madrid, MD 11/18/2020

## 2020-11-19 ENCOUNTER — Other Ambulatory Visit: Payer: Self-pay

## 2020-11-19 DIAGNOSIS — R918 Other nonspecific abnormal finding of lung field: Secondary | ICD-10-CM

## 2020-11-19 LAB — URINALYSIS, COMPLETE
Bilirubin, UA: NEGATIVE
Ketones, UA: NEGATIVE
Leukocytes,UA: NEGATIVE
Nitrite, UA: NEGATIVE
RBC, UA: NEGATIVE
Specific Gravity, UA: 1.01 (ref 1.005–1.030)
Urobilinogen, Ur: 0.2 mg/dL (ref 0.2–1.0)
pH, UA: 6.5 (ref 5.0–7.5)

## 2020-11-19 LAB — MICROSCOPIC EXAMINATION

## 2020-11-22 ENCOUNTER — Telehealth: Payer: Self-pay

## 2020-11-22 DIAGNOSIS — Z794 Long term (current) use of insulin: Secondary | ICD-10-CM

## 2020-11-22 LAB — CYTOLOGY - NON PAP

## 2020-11-22 MED ORDER — BLOOD GLUCOSE MONITOR KIT: PACK | 0 refills | Status: DC

## 2020-11-22 NOTE — Telephone Encounter (Signed)
Please call pt and tell her after [facility]- her insurance will only pay for Accucheck Guide meter. Therefore, had to send in new meter, strips and lancets to pharmacy. And will need to keep her August 9th appt with [facility]

## 2020-11-22 NOTE — Telephone Encounter (Signed)
Spoke to pt, understood and will keep appt with [facility]

## 2020-11-22 NOTE — Telephone Encounter (Signed)
Spoke to [facility] at [place]- pt's Medicaid will not pay for anything but Accucheck Guide. Therefore, pt will need new meter, lancets and strips. I sent in RX on all 3.

## 2020-11-22 NOTE — Telephone Encounter (Signed)
Pt states she needs One Call test strips

## 2020-11-23 ENCOUNTER — Ambulatory Visit: Payer: Medicaid Other

## 2020-11-23 ENCOUNTER — Telehealth: Payer: Self-pay

## 2020-11-23 NOTE — Telephone Encounter (Signed)
Called pt informed her of the information below. Pt gave verbal understanding. Appt scheduled.

## 2020-11-23 NOTE — Telephone Encounter (Signed)
-----   Message from Billey Co, MD sent at 11/23/2020  9:01 AM EDT ----- Good news, no bladder cancer cells on urine sample, I think the redness in the bladder is from her recent infection.  She should follow-up with oncology as scheduled regarding the lung mass that we discussed, and she should follow-up with me in 6 months for her history of recurrent UTIs, thanks  Nickolas Madrid, MD 11/23/2020

## 2020-11-24 ENCOUNTER — Telehealth: Payer: Self-pay | Admitting: Oncology

## 2020-11-24 NOTE — Telephone Encounter (Signed)
Left VM with patient to notify her of PET scan scheduled. Left day/time/location and NPO instructions.

## 2020-11-28 ENCOUNTER — Other Ambulatory Visit: Payer: Self-pay | Admitting: Family Medicine

## 2020-11-28 DIAGNOSIS — F324 Major depressive disorder, single episode, in partial remission: Secondary | ICD-10-CM

## 2020-11-28 DIAGNOSIS — I10 Essential (primary) hypertension: Secondary | ICD-10-CM

## 2020-11-28 DIAGNOSIS — I25118 Atherosclerotic heart disease of native coronary artery with other forms of angina pectoris: Secondary | ICD-10-CM

## 2020-11-28 NOTE — Telephone Encounter (Signed)
Requested Prescriptions  Pending Prescriptions Disp Refills  . spironolactone (ALDACTONE) 25 MG tablet [Pharmacy Med Name: SPIRONOLACTONE 25 MG TABLET] 90 tablet 0    Sig: TAKE 1 TABLET (25 MG TOTAL) BY MOUTH DAILY.     Cardiovascular: Diuretics - Aldosterone Antagonist Failed - 11/28/2020 12:59 PM      Failed - Cr in normal range and within 360 days    Creatinine  Date Value Ref Range Status  04/02/2014 0.75 0.60 - 1.30 mg/dL Final   Creatinine, Ser  Date Value Ref Range Status  11/17/2020 1.50 (H) 0.44 - 1.00 mg/dL Final         Passed - K in normal range and within 360 days    Potassium  Date Value Ref Range Status  08/10/2020 4.6 3.5 - 5.2 mmol/L Final  04/02/2014 3.8 3.5 - 5.1 mmol/L Final         Passed - Na in normal range and within 360 days    Sodium  Date Value Ref Range Status  08/10/2020 136 134 - 144 mmol/L Final  04/02/2014 141 136 - 145 mmol/L Final         Passed - Last BP in normal range    BP Readings from Last 1 Encounters:  11/18/20 137/70         Passed - Valid encounter within last 6 months    Recent Outpatient Visits          3 months ago Type 2 diabetes mellitus with diabetic peripheral angiopathy without gangrene, with long-term current use of insulin ([place])   [facility] [name], MD   4 months ago Encounter for medical examination to establish care   [place], MD      Future Appointments            In 2 months [name], MD [facility], [facility]           . furosemide (LASIX) 40 MG tablet [Pharmacy Med Name: FUROSEMIDE 40 MG TABLET] 90 tablet     Sig: TAKE 1 TABLET BY MOUTH EVERY DAY     Cardiovascular:  Diuretics - Loop Failed - 11/28/2020 12:59 PM      Failed - Cr in normal range and within 360 days    Creatinine  Date Value Ref Range Status  04/02/2014 0.75 0.60 - 1.30 mg/dL Final   Creatinine, Ser  Date Value Ref Range Status  11/17/2020 1.50 (H) 0.44 - 1.00 mg/dL  Final         Passed - K in normal range and within 360 days    Potassium  Date Value Ref Range Status  08/10/2020 4.6 3.5 - 5.2 mmol/L Final  04/02/2014 3.8 3.5 - 5.1 mmol/L Final         Passed - Ca in normal range and within 360 days    Calcium  Date Value Ref Range Status  08/10/2020 9.2 8.7 - 10.3 mg/dL Final   Calcium, Total  Date Value Ref Range Status  04/02/2014 8.1 (L) 8.5 - 10.1 mg/dL Final         Passed - Na in normal range and within 360 days    Sodium  Date Value Ref Range Status  08/10/2020 136 134 - 144 mmol/L Final  04/02/2014 141 136 - 145 mmol/L Final         Passed - Last BP in normal range    BP Readings from Last 1 Encounters:  11/18/20 137/70         Passed - Valid encounter within last 6 months    Recent Outpatient Visits          3 months ago Type 2 diabetes mellitus with diabetic peripheral angiopathy without gangrene, with long-term current use of insulin ([place])   [facility] [name], MD   4 months ago Encounter for medical examination to establish care   [facility] [name], MD      Future Appointments            In 2 months [name], MD [facility], [place]

## 2020-11-28 NOTE — Telephone Encounter (Signed)
Requested medication (s) are due for refill today: yes  Requested medication (s) are on the active medication list: yes  Last refill:  10/06/20  Future visit scheduled: yes  Notes to clinic:  frequent no show/cancelation   Requested Prescriptions  Pending Prescriptions Disp Refills   DULoxetine (CYMBALTA) 20 MG capsule [Pharmacy Med Name: DULOXETINE HCL DR 20 MG CAP] 360 capsule 1    Sig: TAKE 2 CAPSULES (40 MG TOTAL) BY MOUTH IN THE MORNING AND AT BEDTIME.      Psychiatry: Antidepressants - SNRI Passed - 11/28/2020  4:37 AM      Passed - Last BP in normal range    BP Readings from Last 1 Encounters:  11/18/20 137/70          Passed - Valid encounter within last 6 months    Recent Outpatient Visits           3 months ago Type 2 diabetes mellitus with diabetic peripheral angiopathy without gangrene, with long-term current use of insulin ([place])   [facility] [name], MD   4 months ago Encounter for medical examination to establish care   [facility] [name], MD       Future Appointments             In 2 months [name], MD [facility], [facility]

## 2020-11-28 NOTE — Telephone Encounter (Signed)
last RF 08/10/20 #90 1 RF: Lasix

## 2020-12-07 ENCOUNTER — Ambulatory Visit
Admission: RE | Admit: 2020-12-07 | Discharge: 2020-12-07 | Disposition: A | Discharge status: Home / Self Care | Payer: Medicaid Other | Source: Ambulatory Visit | Attending: Oncology | Admitting: Oncology

## 2020-12-07 ENCOUNTER — Other Ambulatory Visit: Payer: Self-pay

## 2020-12-07 DIAGNOSIS — I2510 Atherosclerotic heart disease of native coronary artery without angina pectoris: Secondary | ICD-10-CM | POA: Diagnosis not present

## 2020-12-07 DIAGNOSIS — R911 Solitary pulmonary nodule: Secondary | ICD-10-CM | POA: Diagnosis not present

## 2020-12-07 DIAGNOSIS — R918 Other nonspecific abnormal finding of lung field: Secondary | ICD-10-CM | POA: Insufficient documentation

## 2020-12-07 DIAGNOSIS — I700 Atherosclerosis of aorta: Secondary | ICD-10-CM | POA: Insufficient documentation

## 2020-12-07 LAB — GLUCOSE, CAPILLARY: Glucose-Capillary: 101 mg/dL — ABNORMAL HIGH (ref 70–99)

## 2020-12-07 MED ORDER — FLUDEOXYGLUCOSE F - 18 (FDG) INJECTION
6.1000 | Freq: Once | INTRAVENOUS | Status: AC | PRN
  Administered 2020-12-07: 5.82 via INTRAVENOUS

## 2020-12-09 ENCOUNTER — Inpatient Hospital Stay: Payer: Medicaid Other

## 2020-12-09 ENCOUNTER — Encounter: Payer: Self-pay | Admitting: *Deleted

## 2020-12-09 ENCOUNTER — Encounter: Payer: Self-pay | Admitting: Oncology

## 2020-12-09 ENCOUNTER — Telehealth: Payer: Self-pay

## 2020-12-09 ENCOUNTER — Other Ambulatory Visit: Payer: Self-pay

## 2020-12-09 ENCOUNTER — Inpatient Hospital Stay: Payer: Medicaid Other | Attending: Oncology | Admitting: Oncology

## 2020-12-09 VITALS — BP 144/88 | HR 77 | Temp 96.5°F | Resp 16 | Wt 118.9 lb

## 2020-12-09 DIAGNOSIS — Z888 Allergy status to other drugs, medicaments and biological substances status: Secondary | ICD-10-CM | POA: Diagnosis not present

## 2020-12-09 DIAGNOSIS — E119 Type 2 diabetes mellitus without complications: Secondary | ICD-10-CM | POA: Diagnosis not present

## 2020-12-09 DIAGNOSIS — R3129 Other microscopic hematuria: Secondary | ICD-10-CM | POA: Diagnosis not present

## 2020-12-09 DIAGNOSIS — Z86711 Personal history of pulmonary embolism: Secondary | ICD-10-CM

## 2020-12-09 DIAGNOSIS — I119 Hypertensive heart disease without heart failure: Secondary | ICD-10-CM | POA: Diagnosis not present

## 2020-12-09 DIAGNOSIS — K5730 Diverticulosis of large intestine without perforation or abscess without bleeding: Secondary | ICD-10-CM | POA: Diagnosis not present

## 2020-12-09 DIAGNOSIS — I252 Old myocardial infarction: Secondary | ICD-10-CM

## 2020-12-09 DIAGNOSIS — N281 Cyst of kidney, acquired: Secondary | ICD-10-CM | POA: Diagnosis not present

## 2020-12-09 DIAGNOSIS — F17210 Nicotine dependence, cigarettes, uncomplicated: Secondary | ICD-10-CM

## 2020-12-09 DIAGNOSIS — Z79899 Other long term (current) drug therapy: Secondary | ICD-10-CM | POA: Diagnosis not present

## 2020-12-09 DIAGNOSIS — I10 Essential (primary) hypertension: Secondary | ICD-10-CM | POA: Insufficient documentation

## 2020-12-09 DIAGNOSIS — K449 Diaphragmatic hernia without obstruction or gangrene: Secondary | ICD-10-CM | POA: Diagnosis not present

## 2020-12-09 DIAGNOSIS — I700 Atherosclerosis of aorta: Secondary | ICD-10-CM | POA: Diagnosis not present

## 2020-12-09 DIAGNOSIS — R918 Other nonspecific abnormal finding of lung field: Secondary | ICD-10-CM

## 2020-12-09 DIAGNOSIS — Z7901 Long term (current) use of anticoagulants: Secondary | ICD-10-CM | POA: Diagnosis not present

## 2020-12-09 DIAGNOSIS — F32A Depression, unspecified: Secondary | ICD-10-CM

## 2020-12-09 DIAGNOSIS — Z87440 Personal history of urinary (tract) infections: Secondary | ICD-10-CM

## 2020-12-09 DIAGNOSIS — Z9049 Acquired absence of other specified parts of digestive tract: Secondary | ICD-10-CM

## 2020-12-09 DIAGNOSIS — M47816 Spondylosis without myelopathy or radiculopathy, lumbar region: Secondary | ICD-10-CM | POA: Diagnosis not present

## 2020-12-09 DIAGNOSIS — Z833 Family history of diabetes mellitus: Secondary | ICD-10-CM

## 2020-12-09 DIAGNOSIS — E785 Hyperlipidemia, unspecified: Secondary | ICD-10-CM | POA: Insufficient documentation

## 2020-12-09 DIAGNOSIS — Z8249 Family history of ischemic heart disease and other diseases of the circulatory system: Secondary | ICD-10-CM | POA: Diagnosis not present

## 2020-12-09 DIAGNOSIS — Z720 Tobacco use: Secondary | ICD-10-CM

## 2020-12-09 DIAGNOSIS — R911 Solitary pulmonary nodule: Secondary | ICD-10-CM | POA: Diagnosis not present

## 2020-12-09 NOTE — Progress Notes (Signed)
Appt review. Will follow up with patient after biopsy during next clinic visit.

## 2020-12-09 NOTE — Progress Notes (Signed)
Hematology/Oncology Consult note [facility] Telephone:([phone] [phone]   Patient Care Team: [name], MD as PCP - General (Family Medicine)  REFERRING PROVIDER: Billey Co, MD  CHIEF COMPLAINTS/REASON FOR VISIT:  Evaluation of lung nodule  HISTORY OF PRESENTING ILLNESS:   [name] [name] is a  65 y.o.  female with PMH listed below was seen in consultation at the request of  Billey Co, MD  for evaluation of lung nodule  Patient was recently seen by urology Dr. Candiss Norse ski for evaluation of microscopic hematuria, frequent UTI. 11/17/2020, CT hematuria work-up was obtained which showed no evidence of urinary tract calculus or hydronephrosis.  Small left renal cyst. Incidental findings of 1.9 x 1.2 cm left lower lobe pulmonary nodule worrisome for malignancy. Patient was referred to establish care with oncology for further evaluation  .  Patient denies any shortness of breath, cough, hemoptysis, unintentional weight loss, fever or night sweats. She currently smokes 1 cigar/day.  She started smoking cigarettes at age of 69.  She quitted at age of 83 and restarted smoking cigar a few years ago.  Patient has a history of depression and is on Cymbalta.  Diabetes, hyperlipidemia, hypertension, peripheral artery disease Per patient she also has history of heart attack in 2011. History of right lower extremity BKA in 2021, 02/15/2020 history of pulmonary embolism involving segmental and subsegmental branches of right upper, middle, lower lobe pulmonary arteries.  She was put on on apixaban. Patient lives with her aunt.     Review of Systems  Constitutional:  Negative for appetite change, chills, fatigue and fever.  HENT:   Negative for hearing loss and voice change.   Eyes:  Negative for eye problems.  Respiratory:  Negative for chest tightness and cough.   Cardiovascular:  Negative for chest pain.  Gastrointestinal:  Negative for  abdominal distention, abdominal pain and blood in stool.  Endocrine: Negative for hot flashes.  Genitourinary:  Negative for difficulty urinating and frequency.   Musculoskeletal:  Negative for arthralgias.  Skin:  Negative for itching and rash.  Neurological:  Negative for extremity weakness.  Hematological:  Negative for adenopathy.  Psychiatric/Behavioral:  Negative for confusion.    MEDICAL HISTORY:  Past Medical History:  Diagnosis Date   Depression    Diabetes ([place])    Diabetes mellitus without complication ([place])    Hyperlipidemia    Hypertension     SURGICAL HISTORY: History reviewed. No pertinent surgical history.  SOCIAL HISTORY: Social History   Socioeconomic History   Marital status: Married    Spouse name: Not on file   Number of children: Not on file   Years of education: Not on file   Highest education level: Not on file  Occupational History   Not on file  Tobacco Use   Smoking status: Every Day    Pack years: 0.00    Types: Cigars   Smokeless tobacco: Never  Vaping Use   Vaping Use: Never used  Substance and Sexual Activity   Alcohol use: Never   Drug use: Not Currently   Sexual activity: Not on file  Other Topics Concern   Not on file  Social History Narrative   Not on file   Social Determinants of Health   Financial Resource Strain: Not on file  Food Insecurity: Not on file  Transportation Needs: Not on file  Physical Activity: Not on file  Stress: Not on file  Social Connections: Not on file  Intimate  Partner Violence: Not on file    FAMILY HISTORY: Family History  Problem Relation Age of Onset   Heart disease Mother    Diabetes Mother    Heart disease Father    Diabetes Father     ALLERGIES:  is allergic to lisinopril.  MEDICATIONS:  Current Outpatient Medications  Medication Sig Dispense Refill   ACCU-CHEK GUIDE test strip USE UP TP 4 TIMES A DAY AS DIRECTED     Accu-Chek Softclix Lancets lancets SMARTSIG:Topical 1-4  Times Daily     apixaban (ELIQUIS) 5 MG TABS tablet Take 1 tablet (5 mg total) by mouth in the morning and at bedtime. 180 tablet 1   aspirin 81 MG chewable tablet Chew by mouth.     atorvastatin (LIPITOR) 80 MG tablet Take 1 tablet (80 mg total) by mouth daily. 90 tablet 1   blood glucose meter kit and supplies KIT Dispense based on patient and insurance preference. Use up to four times daily as directed. 1 each 0   DULoxetine (CYMBALTA) 20 MG capsule TAKE 2 CAPSULES (40 MG TOTAL) BY MOUTH IN THE MORNING AND AT BEDTIME. 120 capsule 2   empagliflozin (JARDIANCE) 10 MG TABS tablet Take 1 tablet (10 mg total) by mouth daily. 90 tablet 1   furosemide (LASIX) 40 MG tablet Take 1 tablet (40 mg total) by mouth daily. 90 tablet 1   gabapentin (NEURONTIN) 300 MG capsule Take 300 mg by mouth 3 (three) times daily. neurology     hydrALAZINE (APRESOLINE) 100 MG tablet Take 1 tablet (100 mg total) by mouth 3 (three) times daily. 270 tablet 1   insulin glargine (LANTUS) 100 UNIT/ML injection Please inject 16 units SQ each night 10 mL 2   insulin lispro (HUMALOG) 100 UNIT/ML injection Inject 0.08 mLs (8 Units total) into the skin in the morning, at noon, and at bedtime. 10 mL 2   isosorbide dinitrate (ISORDIL) 30 MG tablet Take 1 tablet (30 mg total) by mouth in the morning, at noon, and at bedtime. 180 tablet 1   metFORMIN (GLUCOPHAGE-XR) 500 MG 24 hr tablet Take 1 tablet (500 mg total) by mouth 2 (two) times daily. 180 tablet 1   metoprolol succinate (TOPROL-XL) 50 MG 24 hr tablet Take 1 tablet (50 mg total) by mouth daily. 90 tablet 1   Multiple Vitamins-Minerals (MULTIVITAMIN WOMEN 50+ PO) Take 1 tablet by mouth at bedtime.     spironolactone (ALDACTONE) 25 MG tablet TAKE 1 TABLET (25 MG TOTAL) BY MOUTH DAILY. 90 tablet 0   No current facility-administered medications for this visit.     PHYSICAL EXAMINATION: ECOG PERFORMANCE STATUS: 1 - Symptomatic but completely ambulatory Vitals:   12/09/20 1024   BP: (!) 144/88  Pulse: 77  Resp: 16  Temp: (!) 96.5 F (35.8 C)  SpO2: 100%   Filed Weights   12/09/20 1024  Weight: 118 lb 15 oz (53.9 kg)    Physical Exam Constitutional:      General: She is not in acute distress. HENT:     Head: Normocephalic and atraumatic.  Eyes:     General: No scleral icterus. Cardiovascular:     Rate and Rhythm: Normal rate and regular rhythm.     Heart sounds: Normal heart sounds.  Pulmonary:     Effort: Pulmonary effort is normal. No respiratory distress.     Breath sounds: No wheezing.  Abdominal:     General: Bowel sounds are normal. There is no distension.     Palpations: Abdomen  is soft.  Musculoskeletal:        General: No deformity. Normal range of motion.     Cervical back: Normal range of motion and neck supple.     Comments: Right lower extremity BKA  Skin:    General: Skin is warm and dry.     Findings: No erythema or rash.  Neurological:     Mental Status: She is alert and oriented to person, place, and time. Mental status is at baseline.     Cranial Nerves: No cranial nerve deficit.     Coordination: Coordination normal.  Psychiatric:        Mood and Affect: Mood normal.    LABORATORY DATA:  I have reviewed the data as listed Lab Results  Component Value Date   WBC 7.9 08/01/2019   HGB 11.3 (L) 08/01/2019   HCT 34.8 (L) 08/01/2019   MCV 86.8 08/01/2019   PLT 374 08/01/2019   Recent Labs    08/10/20 1658 11/17/20 1149  NA 136  --   K 4.6  --   CL 101  --   CO2 17*  --   GLUCOSE 485*  --   BUN 39*  --   CREATININE 1.18* 1.50*  CALCIUM 9.2  --   GFRNONAA 50*  --   GFRAA 57*  --   ALBUMIN 3.2*  --    Iron/TIBC/Ferritin/ %Sat No results found for: IRON, TIBC, FERRITIN, IRONPCTSAT    RADIOGRAPHIC STUDIES: I have personally reviewed the radiological images as listed and agreed with the findings in the report. NM PET Image Initial (PI) Skull Base To Thigh  Result Date: 12/08/2020 CLINICAL DATA:  Initial  treatment strategy for lung nodule. EXAM: NUCLEAR MEDICINE PET SKULL BASE TO THIGH TECHNIQUE: 5.8 mCi F-18 FDG was injected intravenously. Full-ring PET imaging was performed from the skull base to thigh after the radiotracer. CT data was obtained and used for attenuation correction and anatomic localization. Fasting blood glucose: 101 mg/dl COMPARISON:  CT abdomen pelvis 11/17/2020. FINDINGS: Mediastinal blood pool activity: SUV max 2.0 Liver activity: SUV max NA NECK: No abnormal hypermetabolism. Incidental CT findings: Mucosal thickening in the sphenoid sinuses. CHEST: Mild uniform thyroid hypermetabolism. No hypermetabolic mediastinal, hilar or axillary lymph nodes. Spiculated left lower lobe nodule measures 1.3 x 1.8 cm with an SUV max of 6.5. No additional hypermetabolic pulmonary nodules. Incidental CT findings: Atherosclerotic calcification of the aorta, aortic valve and coronary arteries. Heart is at the upper limits of normal in size to mildly enlarged. Decreased attenuation of the intravascular compartment is indicative of anemia. No pericardial or pleural effusion. ABDOMEN/PELVIS: No abnormal hypermetabolism in the liver, adrenal glands, spleen or pancreas. No hypermetabolic lymph nodes. Incidental CT findings: Liver, adrenal glands and right kidney are unremarkable. Low-attenuation lesions in the left kidney measure up to 12 mm, too small to characterize. Spleen, pancreas, stomach and bowel are grossly unremarkable. Cholecystectomy. Calcified portacaval lymph node. Atherosclerotic calcification of the aorta. No free fluid. SKELETON: No abnormal hypermetabolism. Incidental CT findings: Degenerative changes in the spine. IMPRESSION: 1. 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 2. Mild uniform thyroid hypermetabolism. Laboratory correlation may be helpful in further evaluation, as clinically indicated. 3. Aortic atherosclerosis (ICD10-I70.0). Coronary  artery calcification. Electronically Signed   By: Lorin Picket M.D.   On: 12/08/2020 09:26   CT HEMATURIA WORKUP  Result Date: 11/17/2020 CLINICAL DATA:  Microhematuria. Low pelvic pain a couple months ago, resolved after antibiotics. Renal insufficiency. EXAM: CT  ABDOMEN AND PELVIS WITHOUT AND WITH CONTRAST TECHNIQUE: Multidetector CT imaging of the abdomen and pelvis was performed following the standard protocol before and following the bolus administration of intravenous contrast. CONTRAST:  171m OMNIPAQUE IOHEXOL 300 MG/ML  SOLN COMPARISON:  None. FINDINGS: Lower chest: There is a lobulated nodule medially in the left lower lobe which is only imaged on the delayed post-contrast images (series 17). This measures 1.9 x 1.2 cm on image 2/17 and is suspicious for possible malignancy. The visualized lung bases are otherwise clear. No significant pleural or pericardial effusion. There is a small hiatal hernia. Hepatobiliary: The liver is normal in density without suspicious focal abnormality. Status post cholecystectomy without significant biliary dilatation. Pancreas: Unremarkable. No pancreatic ductal dilatation or surrounding inflammatory changes. Spleen: Normal in size without focal abnormality. Adrenals/Urinary Tract: Both adrenal glands appear normal. Pre-contrast images demonstrate no renal, ureteral or bladder calculi. Post-contrast, both kidneys enhance normally. There is no evidence of enhancing renal mass. There are small cysts in the interpolar region of the left kidney, measuring up to 1.3 cm in diameter. Delayed images result in segmental visualization of the ureters. No focal upper tract urothelial abnormalities are identified. The bladder appears unremarkable. Stomach/Bowel: No enteric contrast administered. The stomach appears unremarkable for its degree of distension. No evidence of bowel wall thickening, distention or surrounding inflammatory change. The appendix appears normal. There are  diverticular changes throughout the descending and sigmoid colon. Vascular/Lymphatic: There are no enlarged abdominal or pelvic lymph nodes. Prominent aortic and branch vessel atherosclerosis for age. No evidence of large vessel occlusion or aneurysm. Reproductive: Probable small uterine fibroids.  No adnexal mass. Other: No evidence of abdominal wall mass or hernia. No ascites. Musculoskeletal: No acute or significant osseous findings. Lower lumbar spondylosis noted. IMPRESSION: 1. 1.9 x 1.2 cm left lower lobe pulmonary nodule worrisome for malignancy, likely bronchogenic carcinoma. Recommend full chest CT for further evaluation. 2. No evidence of urinary tract calculus or hydronephrosis. Small left renal cysts. 3. Distal colonic diverticulosis. 4.  Aortic Atherosclerosis (ICD10-I70.0). 5. These results will be called to the ordering clinician or representative by the Radiologist Assistant, and communication documented in the PACS or [employer] Electronically Signed   By: WRichardean SaleM.D.   On: 11/17/2020 18:37      ASSESSMENT & PLAN:  1. Lung nodule   2. Tobacco abuse    #Lung nodule 12/07/2020, PET scan showed left lower lobe 1.3 x 1.8 cm lung nodule with SUV of 6.5.  Suspect early stage primary lung neoplasm.  No hilar or mediastinal lymphadenopathy activity. Recommend tissue diagnosis. Refer to IR for CT-guided lung biopsy.  Patient agrees with the plan  #Tobacco use, smoke cessation was discussed with patient.   All questions were answered. The patient knows to call the clinic with any problems questions or concerns.  cc SBilley Co MD    Return of visit: 1 week after biopsy to discuss pathology report. Thank you for this kind referral and the opportunity to participate in the care of this patient. A copy of today's note is routed to referring provider    ZEarlie Server MD, PhD Hematology Oncology CHoward Memorial Hospitalat ACollege [facility] D/P AphPager-  [phone]/23/2022

## 2020-12-09 NOTE — Telephone Encounter (Signed)
Patient needs CT guided bx of left lower lobe nodule seen on PET.  Anticoagulation clearance form faxed to Otilio Miu, MD.  Once we have received anticoagulation clearance from Dr.  Ronnald Ramp will send scheduling request to specialty scheduling.

## 2020-12-10 ENCOUNTER — Other Ambulatory Visit: Payer: Medicaid Other

## 2020-12-10 ENCOUNTER — Ambulatory Visit: Payer: Medicaid Other | Admitting: Oncology

## 2020-12-10 NOTE — Telephone Encounter (Signed)
Received call from Baxter Flattery at Dr. Ronnald Ramp office.  The Eliquis is actually prescribed and followed by Dr. Wilhelmenia Blase at [facility].  Baxter Flattery has contacted [facility] [phone]) and is faxing the anticoagulation request to that office as well.

## 2020-12-13 NOTE — Telephone Encounter (Signed)
Dr. [name] signed anticoagulation clearance for procedure. Pt ok to stop Eliquis 2 days prior and will be switched to aspirin 81 mg daily after procedure. (clearance to be scanned in chart).   Request for biopsy faxed to specials scheduling.

## 2020-12-14 NOTE — Telephone Encounter (Signed)
patient scheduled for biopsy on 7/7 @ 10 a with 9am arrival time. Per Specials scheduling, pt will need to be COVID tested 3 days prior to procedure. Contacted covid tesing site to see when pt may come to get tested and have results ready by procedure date. Unable to reach anyone, but let VM for a call back.

## 2020-12-15 NOTE — Progress Notes (Signed)
Patient on schedule for Lung biopsy for 12/23/2020, called and left instructions pre procedure, since no answer per patient. Made aware to be here @ 0900, NPO after Mn prior to procedure,holding eliquis with LD 12/20/2020, and driver post procedure/recovery/discharge. Encouraged patient on message to call back for questions.

## 2020-12-15 NOTE — Telephone Encounter (Signed)
Pt scheduled for COVID testing on 7/5 . Biopsy on 7/7. Called pt and notified her of these appts and gave her instructions. Pt also notified to stop Eliquis on Monday and resume on Aspirin 81mg  after biopsy, per Dr. [name].   Please schedule pt for MD follow up approx 1 week after biopsy on 7/7, please notify pt of appt.

## 2020-12-16 ENCOUNTER — Ambulatory Visit: Payer: Medicaid Other | Admitting: Family Medicine

## 2020-12-21 ENCOUNTER — Ambulatory Visit: Payer: Medicaid Other | Admitting: Family Medicine

## 2020-12-21 ENCOUNTER — Other Ambulatory Visit: Payer: Self-pay

## 2020-12-21 ENCOUNTER — Other Ambulatory Visit
Admission: RE | Admit: 2020-12-21 | Discharge: 2020-12-21 | Disposition: A | Discharge status: Home / Self Care | Payer: Medicaid Other | Source: Ambulatory Visit | Attending: Oncology | Admitting: Oncology

## 2020-12-21 ENCOUNTER — Encounter: Payer: Self-pay | Admitting: Family Medicine

## 2020-12-21 ENCOUNTER — Telehealth: Payer: Self-pay

## 2020-12-21 VITALS — BP 120/58 | HR 72 | Ht 65.0 in | Wt 117.0 lb

## 2020-12-21 DIAGNOSIS — G63 Polyneuropathy in diseases classified elsewhere: Secondary | ICD-10-CM

## 2020-12-21 DIAGNOSIS — I25118 Atherosclerotic heart disease of native coronary artery with other forms of angina pectoris: Secondary | ICD-10-CM

## 2020-12-21 DIAGNOSIS — Z20822 Contact with and (suspected) exposure to covid-19: Secondary | ICD-10-CM | POA: Insufficient documentation

## 2020-12-21 DIAGNOSIS — Z794 Long term (current) use of insulin: Secondary | ICD-10-CM

## 2020-12-21 DIAGNOSIS — I5020 Unspecified systolic (congestive) heart failure: Secondary | ICD-10-CM | POA: Diagnosis not present

## 2020-12-21 DIAGNOSIS — E1151 Type 2 diabetes mellitus with diabetic peripheral angiopathy without gangrene: Secondary | ICD-10-CM | POA: Diagnosis not present

## 2020-12-21 DIAGNOSIS — Z86711 Personal history of pulmonary embolism: Secondary | ICD-10-CM | POA: Diagnosis not present

## 2020-12-21 DIAGNOSIS — Z01812 Encounter for preprocedural laboratory examination: Secondary | ICD-10-CM | POA: Insufficient documentation

## 2020-12-21 MED ORDER — GABAPENTIN 100 MG PO CAPS: 100.0000 mg | ORAL_CAPSULE | Freq: Two times a day (BID) | ORAL | 5 refills | Status: DC

## 2020-12-21 NOTE — Telephone Encounter (Signed)
I called pt after speaking to Arbie Cookey in the [facility] location. She stated that pt has had 2 no shows. 1 on Apr 16 2020 @ 3:00 and the other on August 20 2020- no time given. She also stated that after 2 no shows, she will need to speak to the pt. I called [place] and gave her this information along with the number [phone] is main line in and [phone] goes directly to [place]. She said she will be scheduled one more time and then will be thrown out of clinic after 3 no shows. I explained this to [place] as well. I also gave Maquita the number to [facility] for her to get back in with them. [phone]

## 2020-12-21 NOTE — Progress Notes (Signed)
Date:  12/21/2020   Name:  [name] [name]   DOB:  Jul 07, 1955   MRN:  [id number]   Chief Complaint: referral cardiology and Diabetes  Heart Problem This is a chronic (follow up CAD and anticoagulation for pulmonary emboli) problem. The current episode started more than 1 year ago. The problem has been gradually improving.  Congestive Heart Failure Presents for follow-up visit. Pertinent negatives include no chest pressure, edema, orthopnea, palpitations, paroxysmal nocturnal dyspnea or shortness of breath. The symptoms have been stable. Compliance problems include adherence to diet.  Compliance with diet is 76-100%. Compliance with exercise is 76-100%. Compliance with medications is 76-100%.  Diabetes Current diabetic treatment includes diet, oral agent (dual therapy) and insulin injections. She is compliant with treatment all of the time. Meal planning includes avoidance of concentrated sweets and carbohydrate counting. She participates in exercise daily. Her home blood glucose trend is fluctuating dramatically. Her breakfast blood glucose range is generally 140-180 mg/dl. An ACE inhibitor/angiotensin II receptor blocker is being taken.   Lab Results  Component Value Date   CREATININE 1.50 (H) 11/17/2020   BUN 39 (H) 08/10/2020   NA 136 08/10/2020   K 4.6 08/10/2020   CL 101 08/10/2020   CO2 17 (L) 08/10/2020   Lab Results  Component Value Date   CHOL 196 08/10/2020   HDL 45 08/10/2020   LDLCALC 111 (H) 08/10/2020   TRIG 228 (H) 08/10/2020   No results found for: TSH Lab Results  Component Value Date   HGBA1C 10.6 (H) 08/10/2020   Lab Results  Component Value Date   WBC 7.9 08/01/2019   HGB 11.3 (L) 08/01/2019   HCT 34.8 (L) 08/01/2019   MCV 86.8 08/01/2019   PLT 374 08/01/2019   Lab Results  Component Value Date   ALT 33 08/01/2019   AST 33 08/01/2019   ALKPHOS 256 (H) 08/01/2019   BILITOT 0.4 08/01/2019     Review of Systems  Respiratory:  Negative for  shortness of breath.   Cardiovascular:  Negative for palpitations.   Patient Active Problem List   Diagnosis Date Noted   Depression 12/09/2020   Hyperlipidemia 12/09/2020   Hypertension 12/09/2020   Heart failure with reduced ejection fraction ([place]) 02/16/2020   Multiple subsegmental pulmonary emboli without acute cor pulmonale (HCC) 02/15/2020   Iron deficiency anemia 12/18/2019   Rectal pain 12/18/2019   Diabetic foot ulcer ([place]) 10/03/2019   Cellulitis 08/17/2019   Cocaine use 08/17/2019   Peripheral vascular disease ([place]) 08/17/2019   Tobacco use disorder 08/17/2019   Cervical dysplasia 04/02/2019   CAD (coronary artery disease), native coronary artery 06/06/2010   Type II diabetes mellitus ([place]) 06/06/2010   Acute subendocardial infarction, initial episode of care ([place]) 06/05/2010    Allergies  Allergen Reactions   Lisinopril Other (See Comments) and Swelling    Recurrent AKI and hyperkalemia when initiation attempted.    Past Surgical History:  Procedure Laterality Date   Right lower extremity BKA Right     Social History   Tobacco Use   Smoking status: Every Day    Pack years: 0.00    Types: Cigars   Smokeless tobacco: Never  Vaping Use   Vaping Use: Never used  Substance Use Topics   Alcohol use: Never   Drug use: Not Currently     Medication list has been reviewed and updated.  Current Meds  Medication Sig   ACCU-CHEK GUIDE test strip USE UP TP 4 TIMES A DAY  AS DIRECTED   Accu-Chek Softclix Lancets lancets SMARTSIG:Topical 1-4 Times Daily   apixaban (ELIQUIS) 5 MG TABS tablet Take 1 tablet (5 mg total) by mouth in the morning and at bedtime.   aspirin 81 MG chewable tablet Chew by mouth.   atorvastatin (LIPITOR) 80 MG tablet Take 1 tablet (80 mg total) by mouth daily.   blood glucose meter kit and supplies KIT Dispense based on patient and insurance preference. Use up to four times daily as directed.   DULoxetine (CYMBALTA) 20 MG capsule TAKE 2  CAPSULES (40 MG TOTAL) BY MOUTH IN THE MORNING AND AT BEDTIME.   empagliflozin (JARDIANCE) 10 MG TABS tablet Take 1 tablet (10 mg total) by mouth daily.   furosemide (LASIX) 40 MG tablet Take 1 tablet (40 mg total) by mouth daily.   hydrALAZINE (APRESOLINE) 100 MG tablet Take 1 tablet (100 mg total) by mouth 3 (three) times daily.   insulin glargine (LANTUS) 100 UNIT/ML injection Please inject 16 units SQ each night   insulin lispro (HUMALOG) 100 UNIT/ML injection Inject 0.08 mLs (8 Units total) into the skin in the morning, at noon, and at bedtime.   isosorbide dinitrate (ISORDIL) 30 MG tablet Take 1 tablet (30 mg total) by mouth in the morning, at noon, and at bedtime.   metFORMIN (GLUCOPHAGE-XR) 500 MG 24 hr tablet Take 1 tablet (500 mg total) by mouth 2 (two) times daily.   metoprolol succinate (TOPROL-XL) 50 MG 24 hr tablet Take 1 tablet (50 mg total) by mouth daily.   Multiple Vitamins-Minerals (MULTIVITAMIN WOMEN 50+ PO) Take 1 tablet by mouth at bedtime.   spironolactone (ALDACTONE) 25 MG tablet TAKE 1 TABLET (25 MG TOTAL) BY MOUTH DAILY.    PHQ 2/9 Scores 12/21/2020 08/10/2020 07/15/2020  PHQ - 2 Score 0 3 1  PHQ- 9 Score 0 4 1    GAD 7 : Generalized Anxiety Score 08/10/2020 07/15/2020  Nervous, Anxious, on Edge 0 0  Control/stop worrying 0 0  Worry too much - different things 1 0  Trouble relaxing 0 0  Restless 0 0  Easily annoyed or irritable 0 0  Afraid - awful might happen 0 0  Total GAD 7 Score 1 0    BP Readings from Last 3 Encounters:  12/21/20 (!) 120/58  12/09/20 (!) 144/88  11/18/20 137/70    Physical Exam  Wt Readings from Last 3 Encounters:  12/21/20 117 lb (53.1 kg)  12/09/20 118 lb 15 oz (53.9 kg)  12/07/20 117 lb (53.1 kg)    BP (!) 120/58   Pulse 72   Ht '5\' 5"'  (1.651 m)   Wt 117 lb (53.1 kg)   BMI 19.47 kg/m   Assessment and Plan:  1. Type 2 diabetes mellitus with diabetic peripheral angiopathy without gangrene, with long-term current use of  insulin (HCC) Chronic.  Controlled.  Stable.  Patient is being given me a range that her blood sugars have been 90-180 and wondering if there is a dietary approach to this or if she is not taking medication I have reemphasized the importance of medications being taken same time every day and a dietary approach per tickly and avoiding certain things that can cause caloric excess.  We will check an A1c to see approximately the range she had and to see if we need to adjust her current dietary approach of insulin both long-acting and short acting as well as medication is well - HgB A1c  2. Coronary artery disease of native heart  with stable angina pectoris, unspecified vessel or lesion type [facility]) Review of patient's previous chart was done.  Chronic.  Controlled.  Complicated.  Stable.  Patient has atherosclerosis of the aorta as well as the coronary artery.  Patient is currently on antianginal therapy but was to return to her cardiologist because she has ejection fraction in the 20-30 range.  We will obtain a BNP and referral has been made to [facility]ology at [facility] and patient has been strongly encouraged to keep this appointment. - Ambulatory referral to Cardiology  3. Heart failure with reduced ejection fraction ([place]) As noted above ejection fraction is significantly reduced and I think there is probably some cardiomyopathy involved.  Blood pressure is reasonably well ranged and we will check a BNP as well as proceed with cardiology referral. - Brain natriuretic peptide; Future - Ambulatory referral to Cardiology  4. History of pulmonary embolism Patient with history of pulmonary embolism has been on Eliquis and we have suggested that she is beyond the 3 months of treatment and we will discontinue at this time.  5. Polyneuropathy associated with underlying disease ([place]) And is interested in continuing her gabapentin but she has not been taking it and has been reasonably controlled but not  pain-free I will resume at 100 mg twice a day gabapentin in the meantime we will make a referral to neurology to be able to determine if we can maintain at this relatively low amount. - gabapentin (NEURONTIN) 100 MG capsule; Take 1 capsule (100 mg total) by mouth 2 (two) times daily.  Dispense: 60 capsule; Refill: 5

## 2020-12-22 ENCOUNTER — Other Ambulatory Visit: Payer: Self-pay | Admitting: Radiology

## 2020-12-22 LAB — HEMOGLOBIN A1C
Est. average glucose Bld gHb Est-mCnc: 186 mg/dL
Hgb A1c MFr Bld: 8.1 % — ABNORMAL HIGH (ref 4.8–5.6)

## 2020-12-22 LAB — SARS CORONAVIRUS 2 (TAT 6-24 HRS): SARS Coronavirus 2: NEGATIVE

## 2020-12-23 ENCOUNTER — Ambulatory Visit
Admission: RE | Admit: 2020-12-23 | Discharge: 2020-12-23 | Disposition: A | Discharge status: Home / Self Care | Payer: Medicaid Other | Source: Ambulatory Visit | Attending: Oncology | Admitting: Oncology

## 2020-12-23 ENCOUNTER — Other Ambulatory Visit: Payer: Self-pay

## 2020-12-23 ENCOUNTER — Ambulatory Visit
Admission: RE | Admit: 2020-12-23 | Discharge: 2020-12-23 | Disposition: A | Discharge status: Home / Self Care | Payer: Medicaid Other | Source: Ambulatory Visit | Attending: Interventional Radiology | Admitting: Interventional Radiology

## 2020-12-23 DIAGNOSIS — Z79899 Other long term (current) drug therapy: Secondary | ICD-10-CM | POA: Diagnosis not present

## 2020-12-23 DIAGNOSIS — Z48813 Encounter for surgical aftercare following surgery on the respiratory system: Secondary | ICD-10-CM | POA: Diagnosis not present

## 2020-12-23 DIAGNOSIS — C3432 Malignant neoplasm of lower lobe, left bronchus or lung: Secondary | ICD-10-CM | POA: Insufficient documentation

## 2020-12-23 DIAGNOSIS — R918 Other nonspecific abnormal finding of lung field: Secondary | ICD-10-CM | POA: Diagnosis not present

## 2020-12-23 DIAGNOSIS — F17290 Nicotine dependence, other tobacco product, uncomplicated: Secondary | ICD-10-CM | POA: Diagnosis not present

## 2020-12-23 DIAGNOSIS — Z7982 Long term (current) use of aspirin: Secondary | ICD-10-CM | POA: Diagnosis not present

## 2020-12-23 DIAGNOSIS — J95811 Postprocedural pneumothorax: Secondary | ICD-10-CM

## 2020-12-23 LAB — PROTIME-INR
INR: 1 (ref 0.8–1.2)
Prothrombin Time: 12.7 seconds (ref 11.4–15.2)

## 2020-12-23 LAB — CBC
HCT: 32.8 % — ABNORMAL LOW (ref 36.0–46.0)
Hemoglobin: 10.3 g/dL — ABNORMAL LOW (ref 12.0–15.0)
MCH: 29.3 pg (ref 26.0–34.0)
MCHC: 31.4 g/dL (ref 30.0–36.0)
MCV: 93.4 fL (ref 80.0–100.0)
Platelets: 297 10*3/uL (ref 150–400)
RBC: 3.51 MIL/uL — ABNORMAL LOW (ref 3.87–5.11)
RDW: 13.7 % (ref 11.5–15.5)
WBC: 5.1 10*3/uL (ref 4.0–10.5)
nRBC: 0 % (ref 0.0–0.2)

## 2020-12-23 LAB — GLUCOSE, CAPILLARY: Glucose-Capillary: 151 mg/dL — ABNORMAL HIGH (ref 70–99)

## 2020-12-23 MED ORDER — SODIUM CHLORIDE 0.9 % IV SOLN: INTRAVENOUS | Status: DC

## 2020-12-23 MED ORDER — FENTANYL CITRATE (PF) 100 MCG/2ML IJ SOLN
INTRAMUSCULAR | Status: AC | PRN
  Administered 2020-12-23: 50 ug via INTRAVENOUS

## 2020-12-23 MED ORDER — MIDAZOLAM HCL 2 MG/2ML IJ SOLN
INTRAMUSCULAR | Status: AC
  Filled 2020-12-23: qty 2

## 2020-12-23 MED ORDER — FENTANYL CITRATE (PF) 100 MCG/2ML IJ SOLN
INTRAMUSCULAR | Status: AC
  Filled 2020-12-23: qty 2

## 2020-12-23 MED ORDER — MIDAZOLAM HCL 2 MG/2ML IJ SOLN
INTRAMUSCULAR | Status: AC | PRN
  Administered 2020-12-23: 1 mg via INTRAVENOUS

## 2020-12-23 NOTE — H&P (Signed)
Chief Complaint: Patient was seen in consultation today for lung biopsy at the request of Yu,Zhou  Referring Physician(s): Yu,Zhou  Patient Status: [facility] - Out-pt  History of Present Illness: [name] [name] is a 65 y.o. female with recent detection of a roughly 1.8 x 1.3 cm posterior LLL lung mass with SUV of 6.5 by PET. History of smoking and high suspicion of bronchogenic carcinoma. Referred for CT guided biopsy of LLL mass for tissue diagnosis. Currently not having significant symptoms. Was just taken off Eliquis on Tuesday by Dr. Ronnald Ramp for history of prior PE. Last dose was on Monday.  Past Medical History:  Diagnosis Date   Depression    Diabetes ([place])    Diabetes mellitus without complication ([place])    Hyperlipidemia    Hypertension    Peripheral artery disease ([place])     Past Surgical History:  Procedure Laterality Date   Right lower extremity BKA Right     Allergies: Lisinopril  Medications: Prior to Admission medications   Medication Sig Start Date End Date Taking? Authorizing Provider  aspirin 81 MG chewable tablet Chew by mouth.   Yes [provider]  atorvastatin (LIPITOR) 80 MG tablet Take 1 tablet (80 mg total) by mouth daily. 08/10/20  Yes [name], MD  DULoxetine (CYMBALTA) 20 MG capsule TAKE 2 CAPSULES (40 MG TOTAL) BY MOUTH IN THE MORNING AND AT BEDTIME. 10/06/20  Yes [name], MD  empagliflozin (JARDIANCE) 10 MG TABS tablet Take 1 tablet (10 mg total) by mouth daily. 08/10/20  Yes [name], MD  furosemide (LASIX) 40 MG tablet Take 1 tablet (40 mg total) by mouth daily. 08/10/20  Yes [name], MD  hydrALAZINE (APRESOLINE) 100 MG tablet Take 1 tablet (100 mg total) by mouth 3 (three) times daily. 08/10/20  Yes Otilio Miu C, MD  insulin glargine (LANTUS) 100 UNIT/ML injection Please inject 16 units SQ each night 11/01/20 06/17/22 Yes [name], MD  insulin lispro (HUMALOG) 100 UNIT/ML injection Inject 0.08 mLs (8 Units  total) into the skin in the morning, at noon, and at bedtime. 11/01/20  Yes [name], MD  isosorbide dinitrate (ISORDIL) 30 MG tablet Take 1 tablet (30 mg total) by mouth in the morning, at noon, and at bedtime. 08/10/20  Yes [name], MD  metFORMIN (GLUCOPHAGE-XR) 500 MG 24 hr tablet Take 1 tablet (500 mg total) by mouth 2 (two) times daily. 08/10/20  Yes [name], MD  metoprolol succinate (TOPROL-XL) 50 MG 24 hr tablet Take 1 tablet (50 mg total) by mouth daily. 08/10/20  Yes [name], MD  Multiple Vitamins-Minerals (MULTIVITAMIN WOMEN 50+ PO) Take 1 tablet by mouth at bedtime.   Yes [provider]  spironolactone (ALDACTONE) 25 MG tablet TAKE 1 TABLET (25 MG TOTAL) BY MOUTH DAILY. 11/28/20  Yes [name], MD  ACCU-CHEK GUIDE test strip USE UP TP 4 TIMES A DAY AS DIRECTED 11/22/20   [provider]  Accu-Chek Softclix Lancets lancets SMARTSIG:Topical 1-4 Times Daily 11/22/20   [provider]  blood glucose meter kit and supplies KIT Dispense based on patient and insurance preference. Use up to four times daily as directed. 11/22/20   [name], MD  gabapentin (NEURONTIN) 100 MG capsule Take 1 capsule (100 mg total) by mouth 2 (two) times daily. 12/21/20   [name], MD     Family History  Problem Relation Age of Onset   Heart  disease Mother    Diabetes Mother    Heart disease Father    Diabetes Father     Social History   Socioeconomic History   Marital status: Married    Spouse name: Not on file   Number of children: Not on file   Years of education: Not on file   Highest education level: Not on file  Occupational History   Not on file  Tobacco Use   Smoking status: Every Day    Pack years: 0.00    Types: Cigars   Smokeless tobacco: Never  Vaping Use   Vaping Use: Never used  Substance and Sexual Activity   Alcohol use: Never   Drug use: Not Currently   Sexual activity: Not on file  Other Topics Concern   Not  on file  Social History Narrative   Not on file   Social Determinants of Health   Financial Resource Strain: Not on file  Food Insecurity: Not on file  Transportation Needs: Not on file  Physical Activity: Not on file  Stress: Not on file  Social Connections: Not on file    ECOG Status: 0 - Asymptomatic  Review of Systems: A 12 point ROS discussed and pertinent positives are indicated in the HPI above.  All other systems are negative.  Review of Systems  Constitutional: Negative.   Respiratory: Negative.    Cardiovascular: Negative.   Gastrointestinal: Negative.   Genitourinary: Negative.   Musculoskeletal: Negative.   Neurological: Negative.    Vital Signs: BP 138/76   Pulse 79   Temp 98.5 F (36.9 C) (Oral)   Resp 18   Ht _0  (1.651 m)   Wt 53.1 kg   SpO2 100%   BMI 19.47 kg/m   Physical Exam Vitals reviewed.  Constitutional:      General: She is not in acute distress.    Appearance: Normal appearance. She is not ill-appearing, toxic-appearing or diaphoretic.  HENT:     Head: Normocephalic and atraumatic.  Cardiovascular:     Rate and Rhythm: Normal rate and regular rhythm.     Heart sounds: Normal heart sounds. No murmur heard.   No friction rub. No gallop.  Pulmonary:     Effort: Pulmonary effort is normal. No respiratory distress.     Breath sounds: Normal breath sounds. No stridor. No wheezing, rhonchi or rales.  Abdominal:     General: Bowel sounds are normal. There is no distension.     Palpations: Abdomen is soft.     Tenderness: There is no abdominal tenderness. There is no guarding or rebound.  Musculoskeletal:        General: No swelling.     Cervical back: Neck supple.     Comments: Prior right BKA.  Skin:    General: Skin is warm and dry.  Neurological:     General: No focal deficit present.     Mental Status: She is alert and oriented to person, place, and time.    Imaging: NM PET Image Initial (PI) Skull Base To Thigh  Result  Date: 12/08/2020 CLINICAL DATA:  Initial treatment strategy for lung nodule. EXAM: NUCLEAR MEDICINE PET SKULL BASE TO THIGH TECHNIQUE: 5.8 mCi F-18 FDG was injected intravenously. Full-ring PET imaging was performed from the skull base to thigh after the radiotracer. CT data was obtained and used for attenuation correction and anatomic localization. Fasting blood glucose: 101 mg/dl COMPARISON:  CT abdomen pelvis 11/17/2020. FINDINGS: Mediastinal blood pool activity: SUV max 2.0 Liver  activity: SUV max NA NECK: No abnormal hypermetabolism. Incidental CT findings: Mucosal thickening in the sphenoid sinuses. CHEST: Mild uniform thyroid hypermetabolism. No hypermetabolic mediastinal, hilar or axillary lymph nodes. Spiculated left lower lobe nodule measures 1.3 x 1.8 cm with an SUV max of 6.5. No additional hypermetabolic pulmonary nodules. Incidental CT findings: Atherosclerotic calcification of the aorta, aortic valve and coronary arteries. Heart is at the upper limits of normal in size to mildly enlarged. Decreased attenuation of the intravascular compartment is indicative of anemia. No pericardial or pleural effusion. ABDOMEN/PELVIS: No abnormal hypermetabolism in the liver, adrenal glands, spleen or pancreas. No hypermetabolic lymph nodes. Incidental CT findings: Liver, adrenal glands and right kidney are unremarkable. Low-attenuation lesions in the left kidney measure up to 12 mm, too small to characterize. Spleen, pancreas, stomach and bowel are grossly unremarkable. Cholecystectomy. Calcified portacaval lymph node. Atherosclerotic calcification of the aorta. No free fluid. SKELETON: No abnormal hypermetabolism. Incidental CT findings: Degenerative changes in the spine. IMPRESSION: 1. 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 2. Mild uniform thyroid hypermetabolism. Laboratory correlation may be helpful in further evaluation, as clinically indicated. 3. Aortic  atherosclerosis (ICD10-I70.0). Coronary artery calcification. Electronically Signed   By: Lorin Picket M.D.   On: 12/08/2020 09:26    Labs:  CBC: Recent Labs    12/23/20 0856  WBC 5.1  HGB 10.3*  HCT 32.8*  PLT 297    COAGS: No results for input(s): INR, APTT in the last 8760 hours.  BMP: Recent Labs    08/10/20 1658 11/17/20 1149  NA 136  --   K 4.6  --   CL 101  --   CO2 17*  --   GLUCOSE 485*  --   BUN 39*  --   CALCIUM 9.2  --   CREATININE 1.18* 1.50*  GFRNONAA 50*  --   GFRAA 57*  --     LIVER FUNCTION TESTS: Recent Labs    08/10/20 1658  ALBUMIN 3.2*    TUMOR MARKERS: No results for input(s): AFPTM, CEA, CA199, CHROMGRNA in the last 8760 hours.  Assessment and Plan:  For CT guided biopsy of LLL lung mass today. Risks and benefits of CT guided lung nodule biopsy was discussed with the patient including, but not limited to bleeding, hemoptysis, respiratory failure requiring intubation, infection, pneumothorax requiring chest tube placement, stroke from air embolism or even death. All of the patient's questions were answered and the patient is agreeable to proceed. Consent signed and in chart.   Thank you for this interesting consult.  I greatly enjoyed meeting [name] [name] and look forward to participating in their care.  A copy of this report was sent to the requesting provider on this date.  Electronically Signed: Azzie Roup, MD 12/23/2020, 9:49 AM    I spent a total of  15 Minutes   in face to face in clinical consultation, greater than 50% of which was counseling/coordinating care for lung biopsy.

## 2020-12-23 NOTE — Procedures (Signed)
Interventional Radiology Procedure Note  Procedure: CT Guided Biopsy of LLL lung mass  Complications: None  Estimated Blood Loss: < 10 mL  Findings: 18 G core biopsy of 2 cm LLL lung mass performed under CT guidance.  Two core samples obtained and sent to Pathology.  Venetia Night. Kathlene Cote, M.D Pager:  [phone]

## 2020-12-27 ENCOUNTER — Other Ambulatory Visit: Payer: Self-pay | Admitting: Oncology

## 2020-12-29 DIAGNOSIS — E1142 Type 2 diabetes mellitus with diabetic polyneuropathy: Secondary | ICD-10-CM | POA: Diagnosis not present

## 2020-12-30 ENCOUNTER — Other Ambulatory Visit: Payer: Self-pay

## 2020-12-30 ENCOUNTER — Inpatient Hospital Stay: Payer: Medicaid Other

## 2020-12-30 ENCOUNTER — Inpatient Hospital Stay: Payer: Medicaid Other | Attending: Oncology | Admitting: Oncology

## 2020-12-30 ENCOUNTER — Encounter: Payer: Self-pay | Admitting: Oncology

## 2020-12-30 VITALS — BP 95/62 | HR 72 | Temp 97.6°F | Resp 16 | Wt 121.0 lb

## 2020-12-30 DIAGNOSIS — E785 Hyperlipidemia, unspecified: Secondary | ICD-10-CM | POA: Insufficient documentation

## 2020-12-30 DIAGNOSIS — Z888 Allergy status to other drugs, medicaments and biological substances status: Secondary | ICD-10-CM | POA: Diagnosis not present

## 2020-12-30 DIAGNOSIS — D649 Anemia, unspecified: Secondary | ICD-10-CM | POA: Diagnosis not present

## 2020-12-30 DIAGNOSIS — Z8249 Family history of ischemic heart disease and other diseases of the circulatory system: Secondary | ICD-10-CM | POA: Insufficient documentation

## 2020-12-30 DIAGNOSIS — C3490 Malignant neoplasm of unspecified part of unspecified bronchus or lung: Secondary | ICD-10-CM | POA: Insufficient documentation

## 2020-12-30 DIAGNOSIS — Z79899 Other long term (current) drug therapy: Secondary | ICD-10-CM | POA: Insufficient documentation

## 2020-12-30 DIAGNOSIS — F32A Depression, unspecified: Secondary | ICD-10-CM | POA: Diagnosis not present

## 2020-12-30 DIAGNOSIS — E1122 Type 2 diabetes mellitus with diabetic chronic kidney disease: Secondary | ICD-10-CM | POA: Diagnosis not present

## 2020-12-30 DIAGNOSIS — Z833 Family history of diabetes mellitus: Secondary | ICD-10-CM | POA: Insufficient documentation

## 2020-12-30 DIAGNOSIS — Z87440 Personal history of urinary (tract) infections: Secondary | ICD-10-CM | POA: Insufficient documentation

## 2020-12-30 DIAGNOSIS — C3492 Malignant neoplasm of unspecified part of left bronchus or lung: Secondary | ICD-10-CM | POA: Diagnosis not present

## 2020-12-30 DIAGNOSIS — Z720 Tobacco use: Secondary | ICD-10-CM | POA: Diagnosis not present

## 2020-12-30 DIAGNOSIS — R3129 Other microscopic hematuria: Secondary | ICD-10-CM | POA: Insufficient documentation

## 2020-12-30 DIAGNOSIS — Z7189 Other specified counseling: Secondary | ICD-10-CM | POA: Diagnosis not present

## 2020-12-30 DIAGNOSIS — N1832 Chronic kidney disease, stage 3b: Secondary | ICD-10-CM | POA: Insufficient documentation

## 2020-12-30 DIAGNOSIS — Z86711 Personal history of pulmonary embolism: Secondary | ICD-10-CM | POA: Diagnosis not present

## 2020-12-30 DIAGNOSIS — C3432 Malignant neoplasm of lower lobe, left bronchus or lung: Secondary | ICD-10-CM | POA: Insufficient documentation

## 2020-12-30 DIAGNOSIS — I700 Atherosclerosis of aorta: Secondary | ICD-10-CM | POA: Diagnosis not present

## 2020-12-30 DIAGNOSIS — F17210 Nicotine dependence, cigarettes, uncomplicated: Secondary | ICD-10-CM | POA: Insufficient documentation

## 2020-12-30 DIAGNOSIS — I129 Hypertensive chronic kidney disease with stage 1 through stage 4 chronic kidney disease, or unspecified chronic kidney disease: Secondary | ICD-10-CM | POA: Insufficient documentation

## 2020-12-30 DIAGNOSIS — I252 Old myocardial infarction: Secondary | ICD-10-CM | POA: Insufficient documentation

## 2020-12-30 LAB — COMPREHENSIVE METABOLIC PANEL
ALT: 18 U/L (ref 0–44)
AST: 24 U/L (ref 15–41)
Albumin: 3.7 g/dL (ref 3.5–5.0)
Alkaline Phosphatase: 84 U/L (ref 38–126)
Anion gap: 9 (ref 5–15)
BUN: 45 mg/dL — ABNORMAL HIGH (ref 8–23)
CO2: 20 mmol/L — ABNORMAL LOW (ref 22–32)
Calcium: 8.8 mg/dL — ABNORMAL LOW (ref 8.9–10.3)
Chloride: 107 mmol/L (ref 98–111)
Creatinine, Ser: 1.69 mg/dL — ABNORMAL HIGH (ref 0.44–1.00)
GFR, Estimated: 34 mL/min — ABNORMAL LOW (ref >60)
Glucose, Bld: 147 mg/dL — ABNORMAL HIGH (ref 70–99)
Potassium: 4 mmol/L (ref 3.5–5.1)
Sodium: 136 mmol/L (ref 135–145)
Total Bilirubin: 0.3 mg/dL (ref 0.3–1.2)
Total Protein: 7 g/dL (ref 6.5–8.1)

## 2020-12-30 LAB — CBC WITH DIFFERENTIAL/PLATELET
Abs Immature Granulocytes: 0.01 10*3/uL (ref 0.00–0.07)
Basophils Absolute: 0 10*3/uL (ref 0.0–0.1)
Basophils Relative: 1 %
Eosinophils Absolute: 0.2 10*3/uL (ref 0.0–0.5)
Eosinophils Relative: 5 %
HCT: 31.7 % — ABNORMAL LOW (ref 36.0–46.0)
Hemoglobin: 10.4 g/dL — ABNORMAL LOW (ref 12.0–15.0)
Immature Granulocytes: 0 %
Lymphocytes Relative: 22 %
Lymphs Abs: 1 10*3/uL (ref 0.7–4.0)
MCH: 29.8 pg (ref 26.0–34.0)
MCHC: 32.8 g/dL (ref 30.0–36.0)
MCV: 90.8 fL (ref 80.0–100.0)
Monocytes Absolute: 0.5 10*3/uL (ref 0.1–1.0)
Monocytes Relative: 11 %
Neutro Abs: 2.7 10*3/uL (ref 1.7–7.7)
Neutrophils Relative %: 61 %
Platelets: 327 10*3/uL (ref 150–400)
RBC: 3.49 MIL/uL — ABNORMAL LOW (ref 3.87–5.11)
RDW: 14 % (ref 11.5–15.5)
WBC: 4.4 10*3/uL (ref 4.0–10.5)
nRBC: 0 % (ref 0.0–0.2)

## 2020-12-30 LAB — IRON AND TIBC
Iron: 44 ug/dL (ref 28–170)
Saturation Ratios: 13 % (ref 10.4–31.8)
TIBC: 347 ug/dL (ref 250–450)
UIBC: 303 ug/dL

## 2020-12-30 LAB — RETIC PANEL
Immature Retic Fract: 15.3 % (ref 2.3–15.9)
RBC.: 3.51 MIL/uL — ABNORMAL LOW (ref 3.87–5.11)
Retic Count, Absolute: 47.7 10*3/uL (ref 19.0–186.0)
Retic Ct Pct: 1.4 % (ref 0.4–3.1)
Reticulocyte Hemoglobin: 31.8 pg (ref >27.9)

## 2020-12-30 LAB — FERRITIN: Ferritin: 15 ng/mL (ref 11–307)

## 2020-12-30 LAB — VITAMIN B12: Vitamin B-12: 352 pg/mL (ref 180–914)

## 2020-12-30 LAB — FOLATE: Folate: 13.7 ng/mL (ref >5.9)

## 2020-12-30 NOTE — Progress Notes (Signed)
Hematology/Oncology progress note [name] [name] Telephone:([phone] [phone]   Patient Care Team: [name], MD as PCP - General (Family Medicine)  REFERRING PROVIDER: [name], MD  CHIEF COMPLAINTS/REASON FOR VISIT:  Follow up for stage I lung cancer  HISTORY OF PRESENTING ILLNESS:  Patient was recently seen by urology Dr. Candiss Norse ski for evaluation of microscopic hematuria, frequent UTI. 11/17/2020, CT hematuria work-up was obtained which showed no evidence of urinary tract calculus or hydronephrosis.  Small left renal cyst. Incidental findings of 1.9 x 1.2 cm left lower lobe pulmonary nodule worrisome for malignancy. Patient was referred to establish care with oncology for further evaluation  .  Patient denies any shortness of breath, cough, hemoptysis, unintentional weight loss, fever or night sweats. She currently smokes 1 cigar/day.  She started smoking cigarettes at age of 32.  She quitted at age of 11 and restarted smoking cigar a few years ago.  Patient has a history of depression and is on Cymbalta.  Diabetes, hyperlipidemia, hypertension, peripheral artery disease Per patient she also has history of heart attack in 2011. History of right lower extremity BKA in 2021, 02/15/2020 history of pulmonary embolism involving segmental and subsegmental branches of right upper, middle, lower lobe pulmonary arteries.  She was put on on apixaban. Patient lives with her aunt.  12/07/2020, PET scan showed left lower lobe 1.3 x 1.8 cm lung nodule with SUV of 6.5.  Suspect early stage primary lung neoplasm.  No hilar or mediastinal lymphadenopathy activity.  INTERVAL HISTORY [name] [name] is a 65 y.o. female who has above history reviewed by me today presents for follow up visit for Stage I non small cell lung cancer Problems and complaints are listed below: 12/23/2020 S/p CT guided biopsy of left lower lobe mass.  Pathology showed non-small cell  carcinoma, favor adenocarcinoma.  Positive for TTF-1, negative for p40.   Patient presents to discuss results.  No new complaints.  Review of Systems  Constitutional:  Negative for appetite change, chills, fatigue and fever.  HENT:   Negative for hearing loss and voice change.   Eyes:  Negative for eye problems.  Respiratory:  Negative for chest tightness and cough.   Cardiovascular:  Negative for chest pain.  Gastrointestinal:  Negative for abdominal distention, abdominal pain and blood in stool.  Endocrine: Negative for hot flashes.  Genitourinary:  Negative for difficulty urinating and frequency.   Musculoskeletal:  Negative for arthralgias.  Skin:  Negative for itching and rash.  Neurological:  Negative for extremity weakness.  Hematological:  Negative for adenopathy.  Psychiatric/Behavioral:  Negative for confusion.    MEDICAL HISTORY:  Past Medical History:  Diagnosis Date   Depression    Diabetes ([place])    Diabetes mellitus without complication ([place])    Hyperlipidemia    Hypertension    Peripheral artery disease ([place])     SURGICAL HISTORY: Past Surgical History:  Procedure Laterality Date   Right lower extremity BKA Right     SOCIAL HISTORY: Social History   Socioeconomic History   Marital status: Married    Spouse name: Not on file   Number of children: Not on file   Years of education: Not on file   Highest education level: Not on file  Occupational History   Not on file  Tobacco Use   Smoking status: Every Day    Types: Cigars   Smokeless tobacco: Never  Vaping Use   Vaping Use: Never used  Substance and  Sexual Activity   Alcohol use: Never   Drug use: Not Currently   Sexual activity: Not on file  Other Topics Concern   Not on file  Social History Narrative   Not on file   Social Determinants of Health   Financial Resource Strain: Not on file  Food Insecurity: Not on file  Transportation Needs: Not on file  Physical Activity: Not on file   Stress: Not on file  Social Connections: Not on file  Intimate Partner Violence: Not on file    FAMILY HISTORY: Family History  Problem Relation Age of Onset   Heart disease Mother    Diabetes Mother    Heart disease Father    Diabetes Father     ALLERGIES:  is allergic to lisinopril.  MEDICATIONS:  Current Outpatient Medications  Medication Sig Dispense Refill   ACCU-CHEK GUIDE test strip USE UP TP 4 TIMES A DAY AS DIRECTED     Accu-Chek Softclix Lancets lancets SMARTSIG:Topical 1-4 Times Daily     aspirin 81 MG chewable tablet Chew by mouth.     atorvastatin (LIPITOR) 80 MG tablet Take 1 tablet (80 mg total) by mouth daily. 90 tablet 1   blood glucose meter kit and supplies KIT Dispense based on patient and insurance preference. Use up to four times daily as directed. 1 each 0   DULoxetine (CYMBALTA) 20 MG capsule TAKE 2 CAPSULES (40 MG TOTAL) BY MOUTH IN THE MORNING AND AT BEDTIME. 120 capsule 2   empagliflozin (JARDIANCE) 10 MG TABS tablet Take 1 tablet (10 mg total) by mouth daily. 90 tablet 1   gabapentin (NEURONTIN) 100 MG capsule Take 1 capsule (100 mg total) by mouth 2 (two) times daily. 60 capsule 5   hydrALAZINE (APRESOLINE) 100 MG tablet Take 1 tablet (100 mg total) by mouth 3 (three) times daily. 270 tablet 1   insulin glargine (LANTUS) 100 UNIT/ML injection Please inject 16 units SQ each night 10 mL 2   insulin lispro (HUMALOG) 100 UNIT/ML injection Inject 0.08 mLs (8 Units total) into the skin in the morning, at noon, and at bedtime. 10 mL 2   isosorbide dinitrate (ISORDIL) 30 MG tablet Take 1 tablet (30 mg total) by mouth in the morning, at noon, and at bedtime. 180 tablet 1   metFORMIN (GLUCOPHAGE-XR) 500 MG 24 hr tablet Take 1 tablet (500 mg total) by mouth 2 (two) times daily. 180 tablet 1   metoprolol succinate (TOPROL-XL) 50 MG 24 hr tablet Take 1 tablet (50 mg total) by mouth daily. 90 tablet 1   Multiple Vitamins-Minerals (MULTIVITAMIN WOMEN 50+ PO) Take 1  tablet by mouth at bedtime.     spironolactone (ALDACTONE) 25 MG tablet TAKE 1 TABLET (25 MG TOTAL) BY MOUTH DAILY. 90 tablet 0   No current facility-administered medications for this visit.     PHYSICAL EXAMINATION: ECOG PERFORMANCE STATUS: 1 - Symptomatic but completely ambulatory Vitals:   12/30/20 1405  BP: 95/62  Pulse: 72  Resp: 16  Temp: 97.6 F (36.4 C)  SpO2: 100%   Filed Weights   12/30/20 1405  Weight: 121 lb (54.9 kg)    Physical Exam Constitutional:      General: She is not in acute distress. HENT:     Head: Normocephalic and atraumatic.  Eyes:     General: No scleral icterus. Cardiovascular:     Rate and Rhythm: Normal rate and regular rhythm.     Heart sounds: Normal heart sounds.  Pulmonary:  Effort: Pulmonary effort is normal. No respiratory distress.     Breath sounds: No wheezing.  Abdominal:     General: Bowel sounds are normal. There is no distension.     Palpations: Abdomen is soft.  Musculoskeletal:        General: No deformity. Normal range of motion.     Cervical back: Normal range of motion and neck supple.     Comments: Right lower extremity BKA  Skin:    General: Skin is warm and dry.     Findings: No erythema or rash.  Neurological:     Mental Status: She is alert and oriented to person, place, and time. Mental status is at baseline.     Cranial Nerves: No cranial nerve deficit.     Coordination: Coordination normal.  Psychiatric:        Mood and Affect: Mood normal.    LABORATORY DATA:  I have reviewed the data as listed Lab Results  Component Value Date   WBC 4.4 12/30/2020   HGB 10.4 (L) 12/30/2020   HCT 31.7 (L) 12/30/2020   MCV 90.8 12/30/2020   PLT 327 12/30/2020   Recent Labs    08/10/20 1658 11/17/20 1149 12/30/20 1437  NA 136  --  136  K 4.6  --  4.0  CL 101  --  107  CO2 17*  --  20*  GLUCOSE 485*  --  147*  BUN 39*  --  45*  CREATININE 1.18* 1.50* 1.69*  CALCIUM 9.2  --  8.8*  GFRNONAA 50*  --  34*   GFRAA 57*  --   --   PROT  --   --  7.0  ALBUMIN 3.2*  --  3.7  AST  --   --  24  ALT  --   --  18  ALKPHOS  --   --  84  BILITOT  --   --  0.3    Iron/TIBC/Ferritin/ %Sat    Component Value Date/Time   IRON 44 12/30/2020 1437   TIBC 347 12/30/2020 1437   FERRITIN 15 12/30/2020 1437   IRONPCTSAT 13 12/30/2020 1437      RADIOGRAPHIC STUDIES: I have personally reviewed the radiological images as listed and agreed with the findings in the report. NM PET Image Initial (PI) Skull Base To Thigh  Result Date: 12/08/2020 CLINICAL DATA:  Initial treatment strategy for lung nodule. EXAM: NUCLEAR MEDICINE PET SKULL BASE TO THIGH TECHNIQUE: 5.8 mCi F-18 FDG was injected intravenously. Full-ring PET imaging was performed from the skull base to thigh after the radiotracer. CT data was obtained and used for attenuation correction and anatomic localization. Fasting blood glucose: 101 mg/dl COMPARISON:  CT abdomen pelvis 11/17/2020. FINDINGS: Mediastinal blood pool activity: SUV max 2.0 Liver activity: SUV max NA NECK: No abnormal hypermetabolism. Incidental CT findings: Mucosal thickening in the sphenoid sinuses. CHEST: Mild uniform thyroid hypermetabolism. No hypermetabolic mediastinal, hilar or axillary lymph nodes. Spiculated left lower lobe nodule measures 1.3 x 1.8 cm with an SUV max of 6.5. No additional hypermetabolic pulmonary nodules. Incidental CT findings: Atherosclerotic calcification of the aorta, aortic valve and coronary arteries. Heart is at the upper limits of normal in size to mildly enlarged. Decreased attenuation of the intravascular compartment is indicative of anemia. No pericardial or pleural effusion. ABDOMEN/PELVIS: No abnormal hypermetabolism in the liver, adrenal glands, spleen or pancreas. No hypermetabolic lymph nodes. Incidental CT findings: Liver, adrenal glands and right kidney are unremarkable. Low-attenuation lesions in the left  kidney measure up to 12 mm, too small to  characterize. Spleen, pancreas, stomach and bowel are grossly unremarkable. Cholecystectomy. Calcified portacaval lymph node. Atherosclerotic calcification of the aorta. No free fluid. SKELETON: No abnormal hypermetabolism. Incidental CT findings: Degenerative changes in the spine. IMPRESSION: 1. 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 2. Mild uniform thyroid hypermetabolism. Laboratory correlation may be helpful in further evaluation, as clinically indicated. 3. Aortic atherosclerosis (ICD10-I70.0). Coronary artery calcification. Electronically Signed   By: Lorin Picket M.D.   On: 12/08/2020 09:26   DG Chest Port 1 View  Result Date: 12/23/2020 CLINICAL DATA:  Status post left lung biopsy. EXAM: PORTABLE CHEST 1 VIEW COMPARISON:  April 01, 2014. FINDINGS: The heart size and mediastinal contours are within normal limits. Both lungs are clear. No pneumothorax or pleural effusion is noted. The visualized skeletal structures are unremarkable. IMPRESSION: No pneumothorax status post left lung biopsy. Electronically Signed   By: Marijo Conception M.D.   On: 12/23/2020 16:14   CT LUNG MASS BIOPSY  Result Date: 12/23/2020 CLINICAL DATA:  Left lower lobe lung mass measuring approximately 1.3 x 1.8 cm by prior PET scan and demonstrating increased metabolic activity with SUV max of approximately 6.5. The patient presents for biopsy of the mass under CT guidance. EXAM: CT GUIDED CORE BIOPSY OF LEFT LOWER LOBE LUNG MASS ANESTHESIA/SEDATION: 1.0 mg IV Versed; 50 mcg IV Fentanyl Total Moderate Sedation Time:  34 minutes. The patient's level of consciousness and physiologic status were continuously monitored during the procedure by Radiology nursing. PROCEDURE: The procedure risks, benefits, and alternatives were explained to the patient. Questions regarding the procedure were encouraged and answered. The patient understands and consents to the procedure. A time-out  was performed prior to initiating the procedure. CT was performed in a prone position to localize a left lower lobe lung mass. The left posterior chest wall was prepped with chlorhexidine in a sterile fashion, and a sterile drape was applied covering the operative field. A sterile gown and sterile gloves were used for the procedure. Local anesthesia was provided with 1% Lidocaine. Under CT guidance, a 17 gauge trocar needle was advanced to the posterior margin a left lower lobe lung mass. After confirming needle tip position, 2 separate coaxial 18 gauge core biopsy samples were obtained and submitted in formalin. The BioSentry device was utilized in depositing a pleural plug at the pleural puncture site and the outer needle removed. Additional CT was performed after needle removal. COMPLICATIONS: None FINDINGS: There is slight growth of the mass in the posteromedial aspect of the left lower lobe since prior imaging with current transverse dimensions of approximately 1.4 x 2.1 cm. Solid tissue was obtained from the lesion. Postprocedural imaging demonstrates no pneumothorax after the procedure. There is a minimal amount of parenchymal hemorrhage along the deep margin of the lesion after biopsy. A follow-up chest x-ray will be performed during recovery. IMPRESSION: CT-guided core biopsy performed of a left lower lobe lung mass measuring 1.4 x 2.1 cm today by CT. Electronically Signed   By: Aletta Edouard M.D.   On: 12/23/2020 12:21      ASSESSMENT & PLAN:  1. Non-small cell cancer of left lung ([place])   2. Tobacco abuse   3. Goals of care, counseling/discussion   4. Normocytic anemia   5. Stage 3b chronic kidney disease ([place])   Cancer Staging Non-small cell lung cancer ([place]) Staging form: Lung, AJCC 8th Edition - Clinical stage from 12/30/2020: Stage  IA2 (cT1b, cN0, cM0) - Signed by Earlie Server, MD on 12/30/2020  #Stage I left lung non-small cell lung cancer-.  Adenocarcinoma. Pathology was reviewed and  discussed with patient Diagnosis of stage I lung cancer was discussed with patient.  NCCN guideline was reviewed and discussed with patient. Patient's case was presented on tumor board.  Consensus recommendation is to refer for surgical evaluation. Check PFT.  If she is not a surgical candidate, will refer patient to see radiation oncology for SBRT. Patient agrees to this plan. Discussed plan with nurse navigator Hildred Alamin who will help to coordinate patient's PFTs and surgery referral.  #Tobacco use, smoke cessation was discussed with patient. #Normocytic anemia, probably secondary to chronic kidney disease.  Etiologies. Check CBC, CMP, iron TIBC ferritin, folate and B12.  SPEP All questions were answered. The patient knows to call the clinic with any problems questions or concerns.  cc [name], MD   Return of visit: To be determined.      Earlie Server, MD, PhD Hematology Oncology [facility] Pager- [phone] 12/30/2020

## 2020-12-30 NOTE — Progress Notes (Signed)
Patient here for follow up. She has no complaints at this time.

## 2020-12-31 ENCOUNTER — Encounter: Payer: Self-pay | Admitting: *Deleted

## 2020-12-31 DIAGNOSIS — C3490 Malignant neoplasm of unspecified part of unspecified bronchus or lung: Secondary | ICD-10-CM

## 2020-12-31 NOTE — Progress Notes (Signed)
Pt seen in clinic by Dr. [name] on Raynelle Dick 12/30/20 and will need assistance with coordinating appts for PFT's and referral to cardiothoracic surgery. Orders placed. Per scheduling, there are no available appointments for PFT's in July. Once PFT schedule for August released then will schedule pt. Will call pt to make aware as well. Will continue to follow.

## 2021-01-03 LAB — PROTEIN ELECTROPHORESIS, SERUM
A/G Ratio: 1.4 (ref 0.7–1.7)
Albumin ELP: 3.4 g/dL (ref 2.9–4.4)
Alpha-1-Globulin: 0.1 g/dL (ref 0.0–0.4)
Alpha-2-Globulin: 0.8 g/dL (ref 0.4–1.0)
Beta Globulin: 0.9 g/dL (ref 0.7–1.3)
Gamma Globulin: 0.7 g/dL (ref 0.4–1.8)
Globulin, Total: 2.5 g/dL (ref 2.2–3.9)
Total Protein ELP: 5.9 g/dL — ABNORMAL LOW (ref 6.0–8.5)

## 2021-01-04 ENCOUNTER — Other Ambulatory Visit: Payer: Self-pay | Admitting: Family Medicine

## 2021-01-04 DIAGNOSIS — E1151 Type 2 diabetes mellitus with diabetic peripheral angiopathy without gangrene: Secondary | ICD-10-CM

## 2021-01-04 DIAGNOSIS — E7801 Familial hypercholesterolemia: Secondary | ICD-10-CM

## 2021-01-04 DIAGNOSIS — Z794 Long term (current) use of insulin: Secondary | ICD-10-CM

## 2021-01-04 DIAGNOSIS — Z7901 Long term (current) use of anticoagulants: Secondary | ICD-10-CM

## 2021-01-04 DIAGNOSIS — I10 Essential (primary) hypertension: Secondary | ICD-10-CM

## 2021-01-04 DIAGNOSIS — I25118 Atherosclerotic heart disease of native coronary artery with other forms of angina pectoris: Secondary | ICD-10-CM

## 2021-01-04 NOTE — Telephone Encounter (Signed)
  Notes to clinic: The original prescription was discontinued on 12/21/2020 by [name], MD for the following reason: Completed Course. Renewing this prescription may not be appropriate Requested Prescriptions  Pending Prescriptions Disp Refills   ELIQUIS 5 MG TABS tablet [Pharmacy Med Name: ELIQUIS 5 MG TABLET] 180 tablet 1    Sig: TAKE 1 TABLET (5 MG TOTAL) BY MOUTH IN THE MORNING AND AT BEDTIME.      Hematology:  Anticoagulants Failed - 01/04/2021 11:44 AM      Failed - HGB in normal range and within 360 days    Hemoglobin  Date Value Ref Range Status  12/30/2020 10.4 (L) 12.0 - 15.0 g/dL Final   HGB  Date Value Ref Range Status  04/02/2014 12.9 12.0 - 16.0 g/dL Final          Failed - HCT in normal range and within 360 days    HCT  Date Value Ref Range Status  12/30/2020 31.7 (L) 36.0 - 46.0 % Final  04/02/2014 38.7 35.0 - 47.0 % Final          Failed - Cr in normal range and within 360 days    Creatinine  Date Value Ref Range Status  04/02/2014 0.75 0.60 - 1.30 mg/dL Final   Creatinine, Ser  Date Value Ref Range Status  12/30/2020 1.69 (H) 0.44 - 1.00 mg/dL Final          Passed - PLT in normal range and within 360 days    Platelets  Date Value Ref Range Status  12/30/2020 327 150 - 400 K/uL Final   Platelet  Date Value Ref Range Status  04/02/2014 251 150 - 440 x10 3/mm 3 Final          Passed - Valid encounter within last 12 months    Recent Outpatient Visits           2 weeks ago Type 2 diabetes mellitus with diabetic peripheral 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

## 2021-01-04 NOTE — Telephone Encounter (Signed)
Notes to clinic:  The original prescription was discontinued on 12/30/2020 by Livia Snellen, RN for the following reason: Completed Course. Renewing this prescription may not be appropriate   Requested Prescriptions  Pending Prescriptions Disp Refills   furosemide (LASIX) 40 MG tablet [Pharmacy Med Name: FUROSEMIDE 40 MG TABLET] 90 tablet 1    Sig: TAKE 1 TABLET BY MOUTH EVERY DAY      Cardiovascular:  Diuretics - Loop Failed - 01/04/2021 11:38 AM      Failed - Ca in normal range and within 360 days    Calcium  Date Value Ref Range Status  12/30/2020 8.8 (L) 8.9 - 10.3 mg/dL Final   Calcium, Total  Date Value Ref Range Status  04/02/2014 8.1 (L) 8.5 - 10.1 mg/dL Final          Failed - Cr in normal range and within 360 days    Creatinine  Date Value Ref Range Status  04/02/2014 0.75 0.60 - 1.30 mg/dL Final   Creatinine, Ser  Date Value Ref Range Status  12/30/2020 1.69 (H) 0.44 - 1.00 mg/dL Final          Passed - K in normal range and within 360 days    Potassium  Date Value Ref Range Status  12/30/2020 4.0 3.5 - 5.1 mmol/L Final  04/02/2014 3.8 3.5 - 5.1 mmol/L Final          Passed - Na in normal range and within 360 days    Sodium  Date Value Ref Range Status  12/30/2020 136 135 - 145 mmol/L Final  08/10/2020 136 134 - 144 mmol/L Final  04/02/2014 141 136 - 145 mmol/L Final          Passed - Last BP in normal range    BP Readings from Last 1 Encounters:  12/30/20 95/62          Passed - Valid encounter within last 6 months    Recent Outpatient Visits           2 weeks ago Type 2 diabetes mellitus with diabetic peripheral angiopathy without gangrene, with long-term current use of insulin ([place])   [facility] [name], MD   4 months ago Type 2 diabetes mellitus with diabetic peripheral angiopathy without gangrene, with long-term current use of insulin ([place])   [facility] [name], MD   5 months ago Encounter for  medical examination to establish care   [facility] [name], MD       Future Appointments             In 1 month [name], MD [facility]              Signed Prescriptions Disp Refills   JARDIANCE 10 MG TABS tablet 90 tablet 0    Sig: TAKE 1 TABLET BY MOUTH EVERY DAY      Endocrinology:  Diabetes - SGLT2 Inhibitors Failed - 01/04/2021 11:38 AM      Failed - Cr in normal range and within 360 days    Creatinine  Date Value Ref Range Status  04/02/2014 0.75 0.60 - 1.30 mg/dL Final   Creatinine, Ser  Date Value Ref Range Status  12/30/2020 1.69 (H) 0.44 - 1.00 mg/dL Final          Failed - LDL in normal range and within 360 days    Ldl Cholesterol, Calc  Date Value Ref Range Status  04/01/2014 147 (H) 0 - 100 mg/dL Final   LDL Chol Calc (NIH)  Date Value Ref Range Status  08/10/2020 111 (H) 0 - 99 mg/dL Final          Failed - HBA1C is between 0 and 7.9 and within 180 days    Hemoglobin A1C  Date Value Ref Range Status  04/01/2014 10.9 (H) 4.2 - 6.3 % Final    Comment:    The American Diabetes Association recommends that a primary goal of therapy should be <7% and that physicians should reevaluate the treatment regimen in patients with HbA1c values consistently >8%.    Hgb A1c MFr Bld  Date Value Ref Range Status  12/21/2020 8.1 (H) 4.8 - 5.6 % Final    Comment:             Prediabetes: 5.7 - 6.4          Diabetes: >6.4          Glycemic control for adults with diabetes: <7.0           Failed - AA eGFR in normal range and within 360 days    EGFR (African American)  Date Value Ref Range Status  04/02/2014 >60 >62m/min Final  11/11/2012 53 (L)  Final   GFR calc Af Amer  Date Value Ref Range Status  08/10/2020 57 (L) >59 mL/min/1.73 Final    Comment:    **In accordance with recommendations from the NKF-ASN Task force,**   [facility] is in the process of updating its eGFR calculation to the   2021 CKD-EPI  creatinine equation that estimates kidney function   without a race variable.    EGFR (Non-African Amer.)  Date Value Ref Range Status  04/02/2014 >60 >658mmin Final    Comment:    eGFR values <6018min/1.73 m2 may be an indication of chronic kidney disease (CKD). Calculated eGFR, using the MRDR Study equation, is useful in  patients with stable renal function. The eGFR calculation will not be reliable in acutely ill patients when serum creatinine is changing rapidly. It is not useful in patients on dialysis. The eGFR calculation may not be applicable to patients at the low and high extremes of body sizes, pregnant women, and vegetarians.   11/11/2012 46 (L)  Final    Comment:    eGFR values <79m31mn/1.73 m2 may be an indication of chronic kidney disease (CKD). Calculated eGFR is useful in patients with stable renal function. The eGFR calculation will not be reliable in acutely ill patients when serum creatinine is changing rapidly. It is not useful in  patients on dialysis. The eGFR calculation may not be applicable to patients at the low and high extremes of body sizes, pregnant women, and vegetarians.    GFR, Estimated  Date Value Ref Range Status  12/30/2020 34 (L) >60 mL/min Final    Comment:    (NOTE) Calculated using the CKD-EPI Creatinine Equation (2021)           Passed - Valid encounter within last 6 months    Recent Outpatient Visits           2 weeks ago Type 2 diabetes mellitus with diabetic peripheral angiopathy without gangrene, with long-term current use of insulin [facility]   4 months ago Type 2 diabetes mellitus with diabetic peripheral angiopathy without gangrene, with long-term current use of insulin [facility]   5  months ago Encounter for medical examination to establish care   [place], MD       Future Appointments             In 1  month [name], MD [facility]               atorvastatin (LIPITOR) 80 MG tablet 90 tablet 0    Sig: TAKE 1 TABLET BY MOUTH EVERY DAY      Cardiovascular:  Antilipid - Statins Failed - 01/04/2021 11:38 AM      Failed - LDL in normal range and within 360 days    Ldl Cholesterol, Calc  Date Value Ref Range Status  04/01/2014 147 (H) 0 - 100 mg/dL Final   LDL Chol Calc (NIH)  Date Value Ref Range Status  08/10/2020 111 (H) 0 - 99 mg/dL Final          Failed - Triglycerides in normal range and within 360 days    Triglycerides  Date Value Ref Range Status  08/10/2020 228 (H) 0 - 149 mg/dL Final  04/01/2014 94 0 - 200 mg/dL Final          Passed - Total Cholesterol in normal range and within 360 days    Cholesterol, Total  Date Value Ref Range Status  08/10/2020 196 100 - 199 mg/dL Final   Cholesterol  Date Value Ref Range Status  04/01/2014 209 (H) 0 - 200 mg/dL Final          Passed - HDL in normal range and within 360 days    HDL Cholesterol  Date Value Ref Range Status  04/01/2014 43 40 - 60 mg/dL Final   HDL  Date Value Ref Range Status  08/10/2020 45 >39 mg/dL Final          Passed - Patient is not pregnant      Passed - Valid encounter within last 12 months    Recent Outpatient Visits           2 weeks ago Type 2 diabetes mellitus with diabetic peripheral angiopathy without gangrene, with long-term current use of insulin ([place])   [facility] [name], MD   4 months ago Type 2 diabetes mellitus with diabetic peripheral angiopathy without gangrene, with long-term current use of insulin ([place])   [facility] [name], MD   5 months ago Encounter for medical examination to establish care   [place], MD       Future Appointments             In 1 month [name], MD [facility]               hydrALAZINE (APRESOLINE) 100 MG tablet 270 tablet 0     Sig: TAKE 1 TABLET BY MOUTH 3 TIMES DAILY.      Cardiovascular:  Vasodilators Failed - 01/04/2021 11:38 AM      Failed - HCT in normal range and within 360 days    HCT  Date Value Ref Range Status  12/30/2020 31.7 (L) 36.0 - 46.0 % Final  04/02/2014 38.7 35.0 - 47.0 % Final          Failed - HGB in normal range and within 360 days    Hemoglobin  Date Value Ref Range Status  12/30/2020 10.4 (L) 12.0 - 15.0 g/dL Final   HGB  Date  Value Ref Range Status  04/02/2014 12.9 12.0 - 16.0 g/dL Final          Failed - RBC in normal range and within 360 days    RBC  Date Value Ref Range Status  12/30/2020 3.49 (L) 3.87 - 5.11 MIL/uL Final   RBC.  Date Value Ref Range Status  12/30/2020 3.51 (L) 3.87 - 5.11 MIL/uL Final          Passed - WBC in normal range and within 360 days    WBC  Date Value Ref Range Status  12/30/2020 4.4 4.0 - 10.5 K/uL Final          Passed - PLT in normal range and within 360 days    Platelets  Date Value Ref Range Status  12/30/2020 327 150 - 400 K/uL Final   Platelet  Date Value Ref Range Status  04/02/2014 251 150 - 440 x10 3/mm 3 Final          Passed - Last BP in normal range    BP Readings from Last 1 Encounters:  12/30/20 95/62          Passed - Valid encounter within last 12 months    Recent Outpatient Visits           2 weeks ago Type 2 diabetes mellitus with diabetic peripheral angiopathy without gangrene, with long-term current use of insulin ([place])   [facility] [name], MD   4 months ago Type 2 diabetes mellitus with diabetic peripheral angiopathy without gangrene, with long-term current use of insulin ([place])   [facility] [name], MD   5 months ago Encounter for medical examination to establish care   [facility] [name], MD       Future Appointments             In 1 month [name], MD [facility]               metoprolol succinate  (TOPROL-XL) 50 MG 24 hr tablet 90 tablet 0    Sig: TAKE 1 TABLET BY MOUTH EVERY DAY      Cardiovascular:  Beta Blockers Passed - 01/04/2021 11:38 AM      Passed - Last BP in normal range    BP Readings from Last 1 Encounters:  12/30/20 95/62          Passed - Last Heart Rate in normal range    Pulse Readings from Last 1 Encounters:  12/30/20 72          Passed - Valid encounter within last 6 months    Recent Outpatient Visits           2 weeks ago Type 2 diabetes mellitus with diabetic peripheral angiopathy without gangrene, with long-term current use of insulin ([place])   [facility] [name], MD   4 months ago Type 2 diabetes mellitus with diabetic peripheral angiopathy without gangrene, with long-term current use of insulin ([place])   [facility] [name], MD   5 months ago Encounter for medical examination to establish care   [facility] [name], MD       Future Appointments             In 1 month [name], MD [facility]               metFORMIN ([place]) 500  MG 24 hr tablet 180 tablet 0    Sig: TAKE 1 TABLET BY MOUTH TWICE A DAY      Endocrinology:  Diabetes - Biguanides Failed - 01/04/2021 11:38 AM      Failed - Cr in normal range and within 360 days    Creatinine  Date Value Ref Range Status  04/02/2014 0.75 0.60 - 1.30 mg/dL Final   Creatinine, Ser  Date Value Ref Range Status  12/30/2020 1.69 (H) 0.44 - 1.00 mg/dL Final          Failed - HBA1C is between 0 and 7.9 and within 180 days    Hemoglobin A1C  Date Value Ref Range Status  04/01/2014 10.9 (H) 4.2 - 6.3 % Final    Comment:    The American Diabetes Association recommends that a primary goal of therapy should be <7% and that physicians should reevaluate the treatment regimen in patients with HbA1c values consistently >8%.    Hgb A1c MFr Bld  Date Value Ref Range Status  12/21/2020 8.1 (H) 4.8 - 5.6 % Final     Comment:             Prediabetes: 5.7 - 6.4          Diabetes: >6.4          Glycemic control for adults with diabetes: <7.0           Failed - AA eGFR in normal range and within 360 days    EGFR (African American)  Date Value Ref Range Status  04/02/2014 >60 >27m/min Final  11/11/2012 53 (L)  Final   GFR calc Af Amer  Date Value Ref Range Status  08/10/2020 57 (L) >59 mL/min/1.73 Final    Comment:    **In accordance with recommendations from the NKF-ASN Task force,**   [facility] is in the process of updating its eGFR calculation to the   2021 CKD-EPI creatinine equation that estimates kidney function   without a race variable.    EGFR (Non-African Amer.)  Date Value Ref Range Status  04/02/2014 >60 >616mmin Final    Comment:    eGFR values <6066min/1.73 m2 may be an indication of chronic kidney disease (CKD). Calculated eGFR, using the MRDR Study equation, is useful in  patients with stable renal function. The eGFR calculation will not be reliable in acutely ill patients when serum creatinine is changing rapidly. It is not useful in patients on dialysis. The eGFR calculation may not be applicable to patients at the low and high extremes of body sizes, pregnant women, and vegetarians.   11/11/2012 46 (L)  Final    Comment:    eGFR values <73m38mn/1.73 m2 may be an indication of chronic kidney disease (CKD). Calculated eGFR is useful in patients with stable renal function. The eGFR calculation will not be reliable in acutely ill patients when serum creatinine is changing rapidly. It is not useful in  patients on dialysis. The eGFR calculation may not be applicable to patients at the low and high extremes of body sizes, pregnant women, and vegetarians.    GFR, Estimated  Date Value Ref Range Status  12/30/2020 34 (L) >60 mL/min Final    Comment:    (NOTE) Calculated using the CKD-EPI Creatinine Equation (2021)           Passed - Valid encounter within last  6 months    Recent Outpatient Visits           2 weeks ago Type 2  diabetes mellitus with diabetic peripheral angiopathy without gangrene, with long-term current use of insulin ([place])   [facility] [name], MD   4 months ago Type 2 diabetes mellitus with diabetic peripheral angiopathy without gangrene, with long-term current use of insulin ([place])   [facility] [name], MD   5 months ago Encounter for medical examination to establish care   [facility] [name], MD       Future Appointments             In 1 month [name], MD [facility] [place] test strip 100 strip 1    Sig: USE UP TP 4 TIMES A DAY AS DIRECTED      Endocrinology: Diabetes - Testing Supplies Passed - 01/04/2021 11:38 AM      Passed - Valid encounter within last 12 months    Recent Outpatient Visits           2 weeks ago Type 2 diabetes mellitus with diabetic peripheral angiopathy without gangrene, with long-term current use of insulin ([place])   [facility] [name], MD   4 months ago Type 2 diabetes mellitus with diabetic peripheral angiopathy without gangrene, with long-term current use of insulin ([place])   [facility] [name], MD   5 months ago Encounter for medical examination to establish care   [facility] [name], MD       Future Appointments             In 1 month [name], MD [facility]

## 2021-01-10 ENCOUNTER — Other Ambulatory Visit: Payer: Self-pay | Admitting: Thoracic Surgery (Cardiothoracic Vascular Surgery)

## 2021-01-10 ENCOUNTER — Institutional Professional Consult (permissible substitution) (INDEPENDENT_AMBULATORY_CARE_PROVIDER_SITE_OTHER): Payer: Medicaid Other | Admitting: Thoracic Surgery (Cardiothoracic Vascular Surgery)

## 2021-01-10 ENCOUNTER — Encounter: Payer: Self-pay | Admitting: Thoracic Surgery (Cardiothoracic Vascular Surgery)

## 2021-01-10 ENCOUNTER — Other Ambulatory Visit: Payer: Self-pay | Admitting: *Deleted

## 2021-01-10 ENCOUNTER — Other Ambulatory Visit: Payer: Self-pay

## 2021-01-10 VITALS — BP 109/65 | HR 71 | Resp 20 | Ht 65.0 in | Wt 118.9 lb

## 2021-01-10 DIAGNOSIS — C3490 Malignant neoplasm of unspecified part of unspecified bronchus or lung: Secondary | ICD-10-CM

## 2021-01-10 NOTE — H&P (View-Only) (Signed)
PCP is [name], [name] C, MD Referring Provider is [name], Zhou, MD  Chief Complaint  Patient presents with   Lung Lesion    PET 6/21, CT lung mass 7/7    HPI: [name] [name] sent for consultation regarding an adenocarcinoma of the left lower lobe.  [name] [name] is a 64-year-old woman with a history of tobacco abuse, depression, hypertension, hyperlipidemia, diabetes, peripheral arterial disease, MI in 2009, normocytic anemia, stage IIIb chronic kidney disease, and right BKA.  She recently was being evaluated for microscopic hematuria.  A CT was obtained.  She was incidentally noted to have a 1.9 x 1.2 cm left lower lobe pulmonary nodule.  She saw Dr. Yu.  A PET/CT was done which showed the nodule was hypermetabolic with an SUV of 6.  There was no evidence of regional or distant metastatic disease.  She had a needle biopsy which showed adenocarcinoma consistent with lung primary.  She says she had 2 MIs in 2009.  She says the artery had "rerouted."  She has a history of diabetes dating back 15 to 20 years.  She takes both pills and insulin for that.  She had a right BKA for a gangrene which she thinks was related to her diabetes in November 2021.  That was complicated by DVT and PE.  She was treated with Eliquis.  She is not currently on anticoagulation.  She continues to smoke 1 cigar a day.  She had smoked cigarettes earlier in life.  She quit for about 20 years and then started smoking a cigar a day about 2 years ago.  She denies chest pain, pressure, or tightness or shortness of breath with exertion or at rest.  She denies coughing and wheezing and hemoptysis.  She denies headaches or visual changes.  No loss of appetite or weight loss.  [name] [name]: At the time of surgery this patient's most appropriate activity status/level should be described as: []    0    Normal activity, no symptoms [x]    1    Restricted in physical strenuous activity but ambulatory, able to do out light work []    2     Ambulatory and capable of self care, unable to do work activities, up and about >50 % of waking hours                              []    3    Only limited self care, in bed greater than 50% of waking hours []    4    Completely disabled, no self care, confined to bed or chair []    5    Moribund    Past Medical History:  Diagnosis Date   Depression    Diabetes (HCC)    Diabetes mellitus without complication (HCC)    Hyperlipidemia    Hypertension    Peripheral artery disease (HCC)     Past Surgical History:  Procedure Laterality Date   Right lower extremity BKA Right     Family History  Problem Relation Age of Onset   Heart disease Mother    Diabetes Mother    Heart disease Father    Diabetes Father     Social History Social History   Tobacco Use   Smoking status: Every Day    Types: Cigars   Smokeless tobacco: Never   Tobacco comments:    Weaning off smoking cigars  Vaping   Use   Vaping Use: Never used  Substance Use Topics   Alcohol use: Never   Drug use: Not Currently    Current Outpatient Medications  Medication Sig Dispense Refill   ACCU-CHEK GUIDE test strip USE UP TP 4 TIMES A DAY AS DIRECTED 100 strip 1   Accu-Chek Softclix Lancets lancets SMARTSIG:Topical 1-4 Times Daily     aspirin 81 MG chewable tablet Chew by mouth.     atorvastatin (LIPITOR) 80 MG tablet TAKE 1 TABLET BY MOUTH EVERY DAY 90 tablet 0   blood glucose meter kit and supplies KIT Dispense based on patient and insurance preference. Use up to four times daily as directed. 1 each 0   DULoxetine (CYMBALTA) 20 MG capsule TAKE 2 CAPSULES (40 MG TOTAL) BY MOUTH IN THE MORNING AND AT BEDTIME. 120 capsule 2   gabapentin (NEURONTIN) 100 MG capsule Take 1 capsule (100 mg total) by mouth 2 (two) times daily. 60 capsule 5   hydrALAZINE (APRESOLINE) 100 MG tablet TAKE 1 TABLET BY MOUTH 3 TIMES DAILY. 270 tablet 0   insulin glargine (LANTUS) 100 UNIT/ML injection Please inject 16 units SQ each night 10  mL 2   insulin lispro (HUMALOG) 100 UNIT/ML injection Inject 0.08 mLs (8 Units total) into the skin in the morning, at noon, and at bedtime. 10 mL 2   isosorbide dinitrate (ISORDIL) 30 MG tablet Take 1 tablet (30 mg total) by mouth in the morning, at noon, and at bedtime. 180 tablet 1   JARDIANCE 10 MG TABS tablet TAKE 1 TABLET BY MOUTH EVERY DAY 90 tablet 0   metFORMIN (GLUCOPHAGE-XR) 500 MG 24 hr tablet TAKE 1 TABLET BY MOUTH TWICE A DAY 180 tablet 0   metoprolol succinate (TOPROL-XL) 50 MG 24 hr tablet TAKE 1 TABLET BY MOUTH EVERY DAY 90 tablet 0   Multiple Vitamins-Minerals (MULTIVITAMIN WOMEN 50+ PO) Take 1 tablet by mouth at bedtime.     spironolactone (ALDACTONE) 25 MG tablet TAKE 1 TABLET (25 MG TOTAL) BY MOUTH DAILY. 90 tablet 0   No current facility-administered medications for this visit.    Allergies  Allergen Reactions   Lisinopril Other (See Comments) and Swelling    Recurrent AKI and hyperkalemia when initiation attempted.    Review of Systems  Constitutional:  Negative for activity change, appetite change, chills, fever and unexpected weight change.  HENT:  Positive for dental problem (Dentures). Negative for trouble swallowing and voice change.   Eyes:  Negative for visual disturbance.  Respiratory:  Negative for cough, shortness of breath and wheezing.   Cardiovascular:  Negative for chest pain and leg swelling.  Gastrointestinal:  Negative for abdominal distention and abdominal pain.  Genitourinary:  Negative for difficulty urinating and dysuria.  Neurological:  Negative for syncope, weakness and headaches.  Hematological:  Negative for adenopathy. Does not bruise/bleed easily.  All other systems reviewed and are negative.  BP 109/65 (BP Location: Left Arm, Patient Position: Sitting, Cuff Size: Normal)   Pulse 71   Resp 20   Ht 5' 5" (1.651 m)   Wt 118 lb 14.4 oz (53.9 kg)   SpO2 94% Comment: RA  BMI 19.79 kg/m  Physical Exam Vitals reviewed.   Constitutional:      General: She is not in acute distress.    Appearance: Normal appearance.  HENT:     Head: Normocephalic and atraumatic.  Eyes:     General: No scleral icterus.    Extraocular Movements: Extraocular movements intact.  Cardiovascular:       Rate and Rhythm: Normal rate and regular rhythm.     Pulses: Normal pulses.     Heart sounds: Normal heart sounds. No murmur heard.   No friction rub. No gallop.  Pulmonary:     Effort: Pulmonary effort is normal. No respiratory distress.     Breath sounds: Normal breath sounds. No wheezing or rales.  Abdominal:     General: There is no distension.     Palpations: Abdomen is soft.  Musculoskeletal:        General: Deformity (Right BKA with prosthesis) present.  Skin:    General: Skin is warm and dry.  Neurological:     General: No focal deficit present.     Mental Status: She is alert and oriented to person, place, and time.     Cranial Nerves: No cranial nerve deficit.     Motor: No weakness.     Gait: Gait normal.    Diagnostic Tests: NUCLEAR MEDICINE PET SKULL BASE TO THIGH   TECHNIQUE: 5.8 mCi F-18 FDG was injected intravenously. Full-ring PET imaging was performed from the skull base to thigh after the radiotracer. CT data was obtained and used for attenuation correction and anatomic localization.   Fasting blood glucose: 101 mg/dl   COMPARISON:  CT abdomen pelvis 11/17/2020.   FINDINGS: Mediastinal blood pool activity: SUV max 2.0   Liver activity: SUV max NA   NECK:   No abnormal hypermetabolism.   Incidental CT findings:   Mucosal thickening in the sphenoid sinuses.   CHEST:   Mild uniform thyroid hypermetabolism. No hypermetabolic mediastinal, hilar or axillary lymph nodes. Spiculated left lower lobe nodule measures 1.3 x 1.8 cm with an SUV max of 6.5. No additional hypermetabolic pulmonary nodules.   Incidental CT findings:   Atherosclerotic calcification of the aorta, aortic valve  and coronary arteries. Heart is at the upper limits of normal in size to mildly enlarged. Decreased attenuation of the intravascular compartment is indicative of anemia. No pericardial or pleural effusion.   ABDOMEN/PELVIS:   No abnormal hypermetabolism in the liver, adrenal glands, spleen or pancreas. No hypermetabolic lymph nodes.   Incidental CT findings:   Liver, adrenal glands and right kidney are unremarkable. Low-attenuation lesions in the left kidney measure up to 12 mm, too small to characterize. Spleen, pancreas, stomach and bowel are grossly unremarkable. Cholecystectomy. Calcified portacaval lymph node. Atherosclerotic calcification of the aorta. No free fluid.   SKELETON:   No abnormal hypermetabolism.   Incidental CT findings:   Degenerative changes in the spine.   IMPRESSION: 1. 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 2. Mild uniform thyroid hypermetabolism. Laboratory correlation may be helpful in further evaluation, as clinically indicated. 3. Aortic atherosclerosis (ICD10-I70.0). Coronary artery calcification.     Electronically Signed   By: Melinda  Blietz M.D.   On: 12/08/2020 09:26 I personally reviewed the PET/CT images.  There is a hypermetabolic left lower lobe lung nodule with an SUV of 6.5.  No evidence of regional or distant metastatic disease.  Impression: [name] [name] is a 64-year-old woman with a history of tobacco abuse, depression, hypertension, hyperlipidemia, diabetes, peripheral arterial disease, MI in 2009, normocytic anemia, stage IIIb chronic kidney disease, and right BKA.  She was found to have a left lower lobe lung nodule on a CT that was done to evaluate for microhematuria.  That led to a PET/CT and ultimately a needle biopsy which showed an adenocarcinoma consistent with a lung primary.  Clinical stage   Ia (T1, N0) adenocarcinoma left lower lobe.  I discussed with her that this  is an early stage lung cancer that is potentially curable.  There is never a guarantee.  We discussed 2 potential options for therapy, surgery versus stereotactic radiation.  We discussed the relative advantages and disadvantages of each.  She understands the primary advantage of surgery as better chance for cure but with the associated pain and wrist to go along with that.  Stereotactic radiation is less risk upfront but more risk of recurrence.  She strongly prefers surgical resection.  I recommended that we proceed with a robotic left lower lobectomy for definitive management of her clinical stage Ia adenocarcinoma.  Of course we will need her pulmonary function tests prior to surgery.  I informed her of the general nature of the surgery including the need for general anesthesia, the incisions to be used, the use of a drainage tube postoperatively, the expected hospital stay, and the overall recovery.  I informed her of the indications, risks, benefits, and alternatives.  She understands the risks include, but are not limited to death, MI, DVT, PE, bleeding, possible need for transfusion, infection, prolonged air leak, cardiac arrhythmias, as well as possibility of other unstable complications.  She understands pain is variable and small percentage of patients to have chronic pain after surgery.  She needs pulmonary function testing with and without bronchodilators prior to surgery.  She does have a history of MI back in 2009 but has not had any cardiac issues since.  She is not having any chest pain, pressure, tightness, or shortness of breath with exertion.  There is no indication for any additional cardiac work-up at this time but she does have increased risk for perioperative cardiac complications.  She also has an increased risk for perioperative DVT/PE.  Plan: Pulmonary function testing with and without bronchodilators Robotic left lower lobectomy on Monday, 01/17/2021 pending PFT results.  I  spent over 45 minutes in review of records, images, and in consultation with [name] [name] today. Yasmen Cortner C Denaisha Swango, MD [facility] [phone]  

## 2021-01-10 NOTE — Progress Notes (Signed)
PCP is Jones, Deanna C, MD Referring Provider is Yu, Zhou, MD  Chief Complaint  Patient presents with   Lung Lesion    PET 6/21, CT lung mass 7/7    HPI: [name] sent for consultation regarding an adenocarcinoma of the left lower lobe.  [name] [name] is a 65-year-old woman with a history of tobacco abuse, depression, hypertension, hyperlipidemia, diabetes, peripheral arterial disease, MI in 2009, normocytic anemia, stage IIIb chronic kidney disease, and right BKA.  She recently was being evaluated for microscopic hematuria.  A CT was obtained.  She was incidentally noted to have a 1.9 x 1.2 cm left lower lobe pulmonary nodule.  She saw Dr. Yu.  A PET/CT was done which showed the nodule was hypermetabolic with an SUV of 6.  There was no evidence of regional or distant metastatic disease.  She had a needle biopsy which showed adenocarcinoma consistent with lung primary.  She says she had 2 MIs in 2009.  She says the artery had "rerouted."  She has a history of diabetes dating back 15 to 20 years.  She takes both pills and insulin for that.  She had a right BKA for a gangrene which she thinks was related to her diabetes in November 2021.  That was complicated by DVT and PE.  She was treated with Eliquis.  She is not currently on anticoagulation.  She continues to smoke 1 cigar a day.  She had smoked cigarettes earlier in life.  She quit for about 20 years and then started smoking a cigar a day about 2 years ago.  She denies chest pain, pressure, or tightness or shortness of breath with exertion or at rest.  She denies coughing and wheezing and hemoptysis.  She denies headaches or visual changes.  No loss of appetite or weight loss.  Zubrod Score: At the time of surgery this patient's most appropriate activity status/level should be described as: []    0    Normal activity, no symptoms [x]    1    Restricted in physical strenuous activity but ambulatory, able to do out light work []    2     Ambulatory and capable of self care, unable to do work activities, up and about >50 % of waking hours                              []    3    Only limited self care, in bed greater than 50% of waking hours []    4    Completely disabled, no self care, confined to bed or chair []    5    Moribund    Past Medical History:  Diagnosis Date   Depression    Diabetes (HCC)    Diabetes mellitus without complication (HCC)    Hyperlipidemia    Hypertension    Peripheral artery disease (HCC)     Past Surgical History:  Procedure Laterality Date   Right lower extremity BKA Right     Family History  Problem Relation Age of Onset   Heart disease Mother    Diabetes Mother    Heart disease Father    Diabetes Father     Social History Social History   Tobacco Use   Smoking status: Every Day    Types: Cigars   Smokeless tobacco: Never   Tobacco comments:    Weaning off smoking cigars  Vaping   Use   Vaping Use: Never used  Substance Use Topics   Alcohol use: Never   Drug use: Not Currently    Current Outpatient Medications  Medication Sig Dispense Refill   ACCU-CHEK GUIDE test strip USE UP TP 4 TIMES A DAY AS DIRECTED 100 strip 1   Accu-Chek Softclix Lancets lancets SMARTSIG:Topical 1-4 Times Daily     aspirin 81 MG chewable tablet Chew by mouth.     atorvastatin (LIPITOR) 80 MG tablet TAKE 1 TABLET BY MOUTH EVERY DAY 90 tablet 0   blood glucose meter kit and supplies KIT Dispense based on patient and insurance preference. Use up to four times daily as directed. 1 each 0   DULoxetine (CYMBALTA) 20 MG capsule TAKE 2 CAPSULES (40 MG TOTAL) BY MOUTH IN THE MORNING AND AT BEDTIME. 120 capsule 2   gabapentin (NEURONTIN) 100 MG capsule Take 1 capsule (100 mg total) by mouth 2 (two) times daily. 60 capsule 5   hydrALAZINE (APRESOLINE) 100 MG tablet TAKE 1 TABLET BY MOUTH 3 TIMES DAILY. 270 tablet 0   insulin glargine (LANTUS) 100 UNIT/ML injection Please inject 16 units SQ each night 10  mL 2   insulin lispro (HUMALOG) 100 UNIT/ML injection Inject 0.08 mLs (8 Units total) into the skin in the morning, at noon, and at bedtime. 10 mL 2   isosorbide dinitrate (ISORDIL) 30 MG tablet Take 1 tablet (30 mg total) by mouth in the morning, at noon, and at bedtime. 180 tablet 1   JARDIANCE 10 MG TABS tablet TAKE 1 TABLET BY MOUTH EVERY DAY 90 tablet 0   metFORMIN (GLUCOPHAGE-XR) 500 MG 24 hr tablet TAKE 1 TABLET BY MOUTH TWICE A DAY 180 tablet 0   metoprolol succinate (TOPROL-XL) 50 MG 24 hr tablet TAKE 1 TABLET BY MOUTH EVERY DAY 90 tablet 0   Multiple Vitamins-Minerals (MULTIVITAMIN WOMEN 50+ PO) Take 1 tablet by mouth at bedtime.     spironolactone (ALDACTONE) 25 MG tablet TAKE 1 TABLET (25 MG TOTAL) BY MOUTH DAILY. 90 tablet 0   No current facility-administered medications for this visit.    Allergies  Allergen Reactions   Lisinopril Other (See Comments) and Swelling    Recurrent AKI and hyperkalemia when initiation attempted.    Review of Systems  Constitutional:  Negative for activity change, appetite change, chills, fever and unexpected weight change.  HENT:  Positive for dental problem (Dentures). Negative for trouble swallowing and voice change.   Eyes:  Negative for visual disturbance.  Respiratory:  Negative for cough, shortness of breath and wheezing.   Cardiovascular:  Negative for chest pain and leg swelling.  Gastrointestinal:  Negative for abdominal distention and abdominal pain.  Genitourinary:  Negative for difficulty urinating and dysuria.  Neurological:  Negative for syncope, weakness and headaches.  Hematological:  Negative for adenopathy. Does not bruise/bleed easily.  All other systems reviewed and are negative.  BP 109/65 (BP Location: Left Arm, Patient Position: Sitting, Cuff Size: Normal)   Pulse 71   Resp 20   Ht 5' 5" (1.651 m)   Wt 118 lb 14.4 oz (53.9 kg)   SpO2 94% Comment: RA  BMI 19.79 kg/m  Physical Exam Vitals reviewed.   Constitutional:      General: She is not in acute distress.    Appearance: Normal appearance.  HENT:     Head: Normocephalic and atraumatic.  Eyes:     General: No scleral icterus.    Extraocular Movements: Extraocular movements intact.  Cardiovascular:  Rate and Rhythm: Normal rate and regular rhythm.     Pulses: Normal pulses.     Heart sounds: Normal heart sounds. No murmur heard.   No friction rub. No gallop.  Pulmonary:     Effort: Pulmonary effort is normal. No respiratory distress.     Breath sounds: Normal breath sounds. No wheezing or rales.  Abdominal:     General: There is no distension.     Palpations: Abdomen is soft.  Musculoskeletal:        General: Deformity (Right BKA with prosthesis) present.  Skin:    General: Skin is warm and dry.  Neurological:     General: No focal deficit present.     Mental Status: She is alert and oriented to person, place, and time.     Cranial Nerves: No cranial nerve deficit.     Motor: No weakness.     Gait: Gait normal.    Diagnostic Tests: NUCLEAR MEDICINE PET SKULL BASE TO THIGH   TECHNIQUE: 5.8 mCi F-18 FDG was injected intravenously. Full-ring PET imaging was performed from the skull base to thigh after the radiotracer. CT data was obtained and used for attenuation correction and anatomic localization.   Fasting blood glucose: 101 mg/dl   COMPARISON:  CT abdomen pelvis 11/17/2020.   FINDINGS: Mediastinal blood pool activity: SUV max 2.0   Liver activity: SUV max NA   NECK:   No abnormal hypermetabolism.   Incidental CT findings:   Mucosal thickening in the sphenoid sinuses.   CHEST:   Mild uniform thyroid hypermetabolism. No hypermetabolic mediastinal, hilar or axillary lymph nodes. Spiculated left lower lobe nodule measures 1.3 x 1.8 cm with an SUV max of 6.5. No additional hypermetabolic pulmonary nodules.   Incidental CT findings:   Atherosclerotic calcification of the aorta, aortic valve  and coronary arteries. Heart is at the upper limits of normal in size to mildly enlarged. Decreased attenuation of the intravascular compartment is indicative of anemia. No pericardial or pleural effusion.   ABDOMEN/PELVIS:   No abnormal hypermetabolism in the liver, adrenal glands, spleen or pancreas. No hypermetabolic lymph nodes.   Incidental CT findings:   Liver, adrenal glands and right kidney are unremarkable. Low-attenuation lesions in the left kidney measure up to 12 mm, too small to characterize. Spleen, pancreas, stomach and bowel are grossly unremarkable. Cholecystectomy. Calcified portacaval lymph node. Atherosclerotic calcification of the aorta. No free fluid.   SKELETON:   No abnormal hypermetabolism.   Incidental CT findings:   Degenerative changes in the spine.   IMPRESSION: 1. 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 2. Mild uniform thyroid hypermetabolism. Laboratory correlation may be helpful in further evaluation, as clinically indicated. 3. Aortic atherosclerosis (ICD10-I70.0). Coronary artery calcification.     Electronically Signed   By: Lorin Picket M.D.   On: 12/08/2020 09:26 I personally reviewed the PET/CT images.  There is a hypermetabolic left lower lobe lung nodule with an SUV of 6.5.  No evidence of regional or distant metastatic disease.  Impression: [name] [name] is a 65 year old woman with a history of tobacco abuse, depression, hypertension, hyperlipidemia, diabetes, peripheral arterial disease, MI in 2009, normocytic anemia, stage IIIb chronic kidney disease, and right BKA.  She was found to have a left lower lobe lung nodule on a CT that was done to evaluate for microhematuria.  That led to a PET/CT and ultimately a needle biopsy which showed an adenocarcinoma consistent with a lung primary.  Clinical stage  Ia (T1, N0) adenocarcinoma left lower lobe.  I discussed with her that this  is an early stage lung cancer that is potentially curable.  There is never a guarantee.  We discussed 2 potential options for therapy, surgery versus stereotactic radiation.  We discussed the relative advantages and disadvantages of each.  She understands the primary advantage of surgery as better chance for cure but with the associated pain and wrist to go along with that.  Stereotactic radiation is less risk upfront but more risk of recurrence.  She strongly prefers surgical resection.  I recommended that we proceed with a robotic left lower lobectomy for definitive management of her clinical stage Ia adenocarcinoma.  Of course we will need her pulmonary function tests prior to surgery.  I informed her of the general nature of the surgery including the need for general anesthesia, the incisions to be used, the use of a drainage tube postoperatively, the expected hospital stay, and the overall recovery.  I informed her of the indications, risks, benefits, and alternatives.  She understands the risks include, but are not limited to death, MI, DVT, PE, bleeding, possible need for transfusion, infection, prolonged air leak, cardiac arrhythmias, as well as possibility of other unstable complications.  She understands pain is variable and small percentage of patients to have chronic pain after surgery.  She needs pulmonary function testing with and without bronchodilators prior to surgery.  She does have a history of MI back in 2009 but has not had any cardiac issues since.  She is not having any chest pain, pressure, tightness, or shortness of breath with exertion.  There is no indication for any additional cardiac work-up at this time but she does have increased risk for perioperative cardiac complications.  She also has an increased risk for perioperative DVT/PE.  Plan: Pulmonary function testing with and without bronchodilators Robotic left lower lobectomy on Monday, 01/17/2021 pending PFT results.  I  spent over 45 minutes in review of records, images, and in consultation with [name] [name] today. Steven C Hendrickson, MD [facility] [phone]  

## 2021-01-12 ENCOUNTER — Telehealth: Payer: Self-pay | Admitting: Oncology

## 2021-01-12 NOTE — Telephone Encounter (Signed)
Called patient to schedule a f/u with MD after her surgery on 01/17/21. Patient would like to come in on 9/1 at the [facility] cancer center. Sending appt reminder in the mail.

## 2021-01-12 NOTE — Pre-Procedure Instructions (Addendum)
Surgical Instructions    Your procedure is scheduled on Monday 01/17/21.   Report to [facility] Main Entrance "A" at 05:30 A.M., then check in with the Admitting office.  Call this number if you have problems the morning of surgery:  [phone]   If you have any questions prior to your surgery date call [phone]: Open Monday-Friday 8am-4pm    Remember:  Do not eat or drink after midnight the night before your surgery    Take these medicines the morning of surgery with A SIP OF WATER   atorvastatin (LIPITOR)  carboxymethylcellulose (REFRESH PLUS)  DULoxetine (CYMBALTA)  gabapentin (NEURONTIN)   isosorbide dinitrate (ISORDIL)   metoprolol succinate (TOPROL-XL)    As of today, STOP taking any Aspirin (unless otherwise instructed by your surgeon) Aleve, Naproxen, Ibuprofen, Motrin, Advil, Goody's, BC's, all herbal medications, fish oil, and all vitamins.  WHAT DO I DO ABOUT MY DIABETES MEDICATION?   Do not take oral diabetes medicines (pills) the morning of surgery.        JARDIANCE DO NOT TAKE  JARDIANCE the day before surgery and DO NOT TAKE JARDIANCE the morning of surgery.         METFORMIN DO NOT TAKE metFORMIN (GLUCOPHAGE-XR) the morning of surgery.         LANTUS SOLOSTAR        DO NOT TAKE your bedtime dose of LANTUS SOLOSTAR the night before surgery.        HUMALOG KWIKPEN        Do not take Humalog Kwikpen the morning of surgery.          The day of surgery, do not take other diabetes injectables, including Byetta (exenatide), Bydureon (exenatide ER), Victoza (liraglutide), or Trulicity (dulaglutide).  If your CBG is greater than 220 mg/dL, you may take  of your sliding scale (correction) dose of insulin.   HOW TO MANAGE YOUR DIABETES BEFORE AND AFTER SURGERY  Why is it important to control my blood sugar before and after surgery? Improving blood sugar levels before and after surgery helps healing and can limit problems. A way of improving blood sugar  control is eating a healthy diet by:  Eating less sugar and carbohydrates  Increasing activity/exercise  Talking with your doctor about reaching your blood sugar goals High blood sugars (greater than 180 mg/dL) can raise your risk of infections and slow your recovery, so you will need to focus on controlling your diabetes during the weeks before surgery. Make sure that the doctor who takes care of your diabetes knows about your planned surgery including the date and location.  How do I manage my blood sugar before surgery? Check your blood sugar at least 4 times a day, starting 2 days before surgery, to make sure that the level is not too high or low.  Check your blood sugar the morning of your surgery when you wake up and every 2 hours until you get to the Short Stay unit.  If your blood sugar is less than 70 mg/dL, you will need to treat for low blood sugar: Do not take insulin. Treat a low blood sugar (less than 70 mg/dL) with  cup of clear juice (cranberry or apple), 4 glucose tablets, OR glucose gel. Recheck blood sugar in 15 minutes after treatment (to make sure it is greater than 70 mg/dL). If your blood sugar is not greater than 70 mg/dL on recheck, call [phone] for further instructions. Report your blood sugar to the short stay nurse  when you get to Short Stay.  If you are admitted to the hospital after surgery: Your blood sugar will be checked by the staff and you will probably be given insulin after surgery (instead of oral diabetes medicines) to make sure you have good blood sugar levels. The goal for blood sugar control after surgery is 80-180 mg/dL.                      Do NOT Smoke (Tobacco/Vaping) or drink Alcohol 24 hours prior to your procedure.  If you use a CPAP at night, you may bring all equipment for your overnight stay.   Contacts, glasses, piercing's, hearing aid's, dentures or partials may not be worn into surgery, please bring cases for these belongings.     For patients admitted to the hospital, discharge time will be determined by your treatment team.   Patients discharged the day of surgery will not be allowed to drive home, and someone needs to stay with them for 24 hours.  ONLY 1 SUPPORT PERSON MAY BE PRESENT WHILE YOU ARE IN SURGERY. IF YOU ARE TO BE ADMITTED ONCE YOU ARE IN YOUR ROOM YOU WILL BE ALLOWED TWO (2) VISITORS.  Minor children may have two parents present. Special consideration for safety and communication needs will be reviewed on a case by case basis.   Special instructions:   [place]- Preparing For Surgery  Before surgery, you can play an important role. Because skin is not sterile, your skin needs to be as free of germs as possible. You can reduce the number of germs on your skin by washing with CHG (chlorahexidine gluconate) Soap before surgery.  CHG is an antiseptic cleaner which kills germs and bonds with the skin to continue killing germs even after washing.    Oral Hygiene is also important to reduce your risk of infection.  Remember - BRUSH YOUR TEETH THE MORNING OF SURGERY WITH YOUR REGULAR TOOTHPASTE  Please do not use if you have an allergy to CHG or antibacterial soaps. If your skin becomes reddened/irritated stop using the CHG.  Do not shave (including legs and underarms) for at least 48 hours prior to first CHG shower. It is OK to shave your face.  Please follow these instructions carefully.   Shower the NIGHT BEFORE SURGERY and the MORNING OF SURGERY  If you chose to wash your hair, wash your hair first as usual with your normal shampoo.  After you shampoo, rinse your hair and body thoroughly to remove the shampoo.  Use CHG Soap as you would any other liquid soap. You can apply CHG directly to the skin and wash gently with a scrungie or a clean washcloth.   Apply the CHG Soap to your body ONLY FROM THE NECK DOWN.  Do not use on open wounds or open sores. Avoid contact with your eyes, ears, mouth and  genitals (private parts). Wash Face and genitals (private parts)  with your normal soap.   Wash thoroughly, paying special attention to the area where your surgery will be performed.  Thoroughly rinse your body with warm water from the neck down.  DO NOT shower/wash with your normal soap after using and rinsing off the CHG Soap.  Pat yourself dry with a CLEAN TOWEL.  Wear CLEAN PAJAMAS to bed the night before surgery  Place CLEAN SHEETS on your bed the night before your surgery  DO NOT SLEEP WITH PETS.   Day of Surgery: Shower with CHG soap.  Do not wear jewelry, make up, nail polish, gel polish, artificial nails, or any other type of covering on natural nails including finger and toenails. If patients have artificial nails, gel coating, etc. that need to be removed by a nail salon please have this removed prior to surgery. Surgery may need to be canceled/delayed if the surgeon/ anesthesia feels like the patient is unable to be adequately monitored. Do not wear lotions, powders, perfumes/colognes, or deodorant. Do not shave 48 hours prior to surgery.  Men may shave face and neck. Do not bring valuables to the hospital. [facility] is not responsible for any belongings or valuables. Wear Clean/Comfortable clothing the morning of surgery Remember to brush your teeth WITH YOUR REGULAR TOOTHPASTE.   Please read over the following fact sheets that you were given.

## 2021-01-13 ENCOUNTER — Encounter (HOSPITAL_COMMUNITY)
Admission: RE | Admit: 2021-01-13 | Discharge: 2021-01-13 | Disposition: A | Discharge status: Home / Self Care | Payer: Medicaid Other | Source: Ambulatory Visit | Attending: Thoracic Surgery (Cardiothoracic Vascular Surgery) | Admitting: Thoracic Surgery (Cardiothoracic Vascular Surgery)

## 2021-01-13 ENCOUNTER — Other Ambulatory Visit: Payer: Self-pay

## 2021-01-13 ENCOUNTER — Ambulatory Visit (HOSPITAL_COMMUNITY)
Admission: RE | Admit: 2021-01-13 | Discharge: 2021-01-13 | Disposition: A | Discharge status: Home / Self Care | Payer: Medicaid Other | Source: Ambulatory Visit | Attending: Thoracic Surgery (Cardiothoracic Vascular Surgery) | Admitting: Thoracic Surgery (Cardiothoracic Vascular Surgery)

## 2021-01-13 ENCOUNTER — Encounter (HOSPITAL_COMMUNITY): Payer: Self-pay

## 2021-01-13 DIAGNOSIS — Z20822 Contact with and (suspected) exposure to covid-19: Secondary | ICD-10-CM | POA: Insufficient documentation

## 2021-01-13 DIAGNOSIS — R911 Solitary pulmonary nodule: Secondary | ICD-10-CM | POA: Diagnosis not present

## 2021-01-13 DIAGNOSIS — C3490 Malignant neoplasm of unspecified part of unspecified bronchus or lung: Secondary | ICD-10-CM

## 2021-01-13 DIAGNOSIS — Z01818 Encounter for other preprocedural examination: Secondary | ICD-10-CM | POA: Diagnosis not present

## 2021-01-13 HISTORY — DX: Malignant (primary) neoplasm, unspecified: C80.1

## 2021-01-13 HISTORY — DX: Atherosclerotic heart disease of native coronary artery without angina pectoris: I25.10

## 2021-01-13 HISTORY — DX: Heart failure, unspecified: I50.9

## 2021-01-13 HISTORY — DX: Chronic kidney disease, unspecified: N18.9

## 2021-01-13 HISTORY — DX: Anemia, unspecified: D64.9

## 2021-01-13 LAB — CBC
HCT: 33 % — ABNORMAL LOW (ref 36.0–46.0)
Hemoglobin: 10.2 g/dL — ABNORMAL LOW (ref 12.0–15.0)
MCH: 29.1 pg (ref 26.0–34.0)
MCHC: 30.9 g/dL (ref 30.0–36.0)
MCV: 94 fL (ref 80.0–100.0)
Platelets: 330 10*3/uL (ref 150–400)
RBC: 3.51 MIL/uL — ABNORMAL LOW (ref 3.87–5.11)
RDW: 13.8 % (ref 11.5–15.5)
WBC: 5.3 10*3/uL (ref 4.0–10.5)
nRBC: 0 % (ref 0.0–0.2)

## 2021-01-13 LAB — COMPREHENSIVE METABOLIC PANEL
ALT: 18 U/L (ref 0–44)
AST: 22 U/L (ref 15–41)
Albumin: 3.4 g/dL — ABNORMAL LOW (ref 3.5–5.0)
Alkaline Phosphatase: 90 U/L (ref 38–126)
Anion gap: 6 (ref 5–15)
BUN: 50 mg/dL — ABNORMAL HIGH (ref 8–23)
CO2: 21 mmol/L — ABNORMAL LOW (ref 22–32)
Calcium: 8.8 mg/dL — ABNORMAL LOW (ref 8.9–10.3)
Chloride: 108 mmol/L (ref 98–111)
Creatinine, Ser: 1.5 mg/dL — ABNORMAL HIGH (ref 0.44–1.00)
GFR, Estimated: 39 mL/min — ABNORMAL LOW (ref >60)
Glucose, Bld: 184 mg/dL — ABNORMAL HIGH (ref 70–99)
Potassium: 4 mmol/L (ref 3.5–5.1)
Sodium: 135 mmol/L (ref 135–145)
Total Bilirubin: 0.2 mg/dL — ABNORMAL LOW (ref 0.3–1.2)
Total Protein: 6.4 g/dL — ABNORMAL LOW (ref 6.5–8.1)

## 2021-01-13 LAB — BLOOD GAS, ARTERIAL
Acid-base deficit: 4.2 mmol/L — ABNORMAL HIGH (ref 0.0–2.0)
Bicarbonate: 20.8 mmol/L (ref 20.0–28.0)
Drawn by: 602861
FIO2: 21
O2 Saturation: 98.4 %
Patient temperature: 37
pCO2 arterial: 41 mmHg (ref 32.0–48.0)
pH, Arterial: 7.325 — ABNORMAL LOW (ref 7.350–7.450)
pO2, Arterial: 141 mmHg — ABNORMAL HIGH (ref 83.0–108.0)

## 2021-01-13 LAB — SURGICAL PCR SCREEN
MRSA, PCR: NEGATIVE
Staphylococcus aureus: NEGATIVE

## 2021-01-13 LAB — URINALYSIS, ROUTINE W REFLEX MICROSCOPIC
Bacteria, UA: NONE SEEN
Bilirubin Urine: NEGATIVE
Glucose, UA: >=500 mg/dL — AB
Hgb urine dipstick: NEGATIVE
Ketones, ur: NEGATIVE mg/dL
Leukocytes,Ua: NEGATIVE
Nitrite: NEGATIVE
Protein, ur: 100 mg/dL — AB
Specific Gravity, Urine: 1.013 (ref 1.005–1.030)
pH: 7 (ref 5.0–8.0)

## 2021-01-13 LAB — TYPE AND SCREEN
ABO/RH(D): B POS
Antibody Screen: NEGATIVE

## 2021-01-13 LAB — PROTIME-INR
INR: 0.9 (ref 0.8–1.2)
Prothrombin Time: 12 seconds (ref 11.4–15.2)

## 2021-01-13 LAB — APTT: aPTT: 23 seconds — ABNORMAL LOW (ref 24–36)

## 2021-01-13 LAB — GLUCOSE, CAPILLARY: Glucose-Capillary: 178 mg/dL — ABNORMAL HIGH (ref 70–99)

## 2021-01-13 LAB — SARS CORONAVIRUS 2 (TAT 6-24 HRS): SARS Coronavirus 2: NEGATIVE

## 2021-01-13 NOTE — Progress Notes (Signed)
PCP - Dr. Coralie Common Cardiologist - [facility]. Patient does not know a specific MD name. States she has an appointment with Lars Masson, NP on 01/24/21 at [place]. Patient states this will be her first time going to this practice. [phone]  PPM/ICD - n/a Device Orders - n/a Rep Notified - n/a  Chest x-ray - 01/13/21 EKG - 01/13/21 Stress Test - [facility]/02/14 at [facility]. In Care Everywhere ECHO - 02/16/20 at [facility]. In Care Everywhere Cardiac Cath - 02/20/20 at [facility]. In Care Everywhere  Sleep Study - denies CPAP - n/a  Fasting Blood Sugar - 178 Checks Blood Sugar _ three times a day Per patient normally ranges 170-190  Blood Thinner Instructions: n/a  Aspirin Instructions: As of today stop taking any Aspirin unless otherwise instructed by your surgeon. Patient has not received any instructions from surgeon and will contact surgeon today.   NPO after midnight  COVID TEST- Yes. 01/13/21 at PAT appointment. Results pending.    Anesthesia review: Yes. Cardiac History.  Patient denies shortness of breath, fever, cough and chest pain at PAT appointment   All instructions explained to the patient, with a verbal understanding of the material. Patient agrees to go over the instructions while at home for a better understanding. Patient also instructed to self quarantine after being tested for COVID-19. The opportunity to ask questions was provided.

## 2021-01-14 ENCOUNTER — Other Ambulatory Visit: Payer: Self-pay | Admitting: *Deleted

## 2021-01-14 ENCOUNTER — Ambulatory Visit (HOSPITAL_COMMUNITY)
Admission: RE | Admit: 2021-01-14 | Discharge: 2021-01-14 | Disposition: A | Discharge status: Home / Self Care | Payer: Medicaid Other | Source: Ambulatory Visit | Attending: Thoracic Surgery (Cardiothoracic Vascular Surgery) | Admitting: Thoracic Surgery (Cardiothoracic Vascular Surgery)

## 2021-01-14 ENCOUNTER — Encounter (HOSPITAL_COMMUNITY): Payer: Self-pay

## 2021-01-14 ENCOUNTER — Ambulatory Visit (HOSPITAL_BASED_OUTPATIENT_CLINIC_OR_DEPARTMENT_OTHER)
Admission: RE | Admit: 2021-01-14 | Discharge: 2021-01-14 | Disposition: A | Discharge status: Home / Self Care | Payer: Medicaid Other | Source: Ambulatory Visit | Attending: Thoracic Surgery (Cardiothoracic Vascular Surgery) | Admitting: Thoracic Surgery (Cardiothoracic Vascular Surgery)

## 2021-01-14 DIAGNOSIS — Z0189 Encounter for other specified special examinations: Secondary | ICD-10-CM

## 2021-01-14 DIAGNOSIS — C3490 Malignant neoplasm of unspecified part of unspecified bronchus or lung: Secondary | ICD-10-CM

## 2021-01-14 LAB — PULMONARY FUNCTION TEST
DL/VA % pred: 68 %
DL/VA: 2.87 ml/min/mmHg/L
DLCO unc % pred: 49 %
DLCO unc: 10.36 ml/min/mmHg
FEF 25-75 Post: 2.3 L/sec
FEF 25-75 Pre: 2.43 L/sec
FEF2575-%Change-Post: -5 %
FEF2575-%Pred-Post: 111 %
FEF2575-%Pred-Pre: 117 %
FEV1-%Change-Post: 1 %
FEV1-%Pred-Post: 112 %
FEV1-%Pred-Pre: 110 %
FEV1-Post: 2.39 L
FEV1-Pre: 2.35 L
FEV1FVC-%Change-Post: 8 %
FEV1FVC-%Pred-Pre: 97 %
FEV6-%Change-Post: -2 %
FEV6-%Pred-Post: 108 %
FEV6-%Pred-Pre: 110 %
FEV6-Post: 2.85 L
FEV6-Pre: 2.91 L
FEV6FVC-%Change-Post: 0 %
FEV6FVC-%Pred-Post: 102 %
FEV6FVC-%Pred-Pre: 103 %
FVC-%Change-Post: -6 %
FVC-%Pred-Post: 105 %
FVC-%Pred-Pre: 112 %
FVC-Post: 2.86 L
FVC-Pre: 3.06 L
Post FEV1/FVC ratio: 83 %
Post FEV6/FVC ratio: 99 %
Pre FEV1/FVC ratio: 77 %
Pre FEV6/FVC Ratio: 100 %
RV % pred: 77 %
RV: 1.62 L
TLC % pred: 83 %
TLC: 4.34 L

## 2021-01-14 LAB — ECHOCARDIOGRAM COMPLETE
AR max vel: 1.78 cm2
AV Area VTI: 1.7 cm2
AV Area mean vel: 1.76 cm2
AV Mean grad: 4 mmHg
AV Peak grad: 7.7 mmHg
Ao pk vel: 1.39 m/s
Area-P 1/2: 2.62 cm2
MV VTI: 1.75 cm2
S' Lateral: 2.6 cm

## 2021-01-14 LAB — GLUCOSE, CAPILLARY: Glucose-Capillary: 119 mg/dL — ABNORMAL HIGH (ref 70–99)

## 2021-01-14 LAB — HEMOGLOBIN A1C
Hgb A1c MFr Bld: 8 % — ABNORMAL HIGH (ref 4.8–5.6)
Mean Plasma Glucose: 183 mg/dL

## 2021-01-14 MED ORDER — ALBUTEROL SULFATE (2.5 MG/3ML) 0.083% IN NEBU
2.5000 mg | INHALATION_SOLUTION | Freq: Once | RESPIRATORY_TRACT | Status: AC
  Administered 2021-01-14: 2.5 mg via RESPIRATORY_TRACT

## 2021-01-14 NOTE — Anesthesia Preprocedure Evaluation (Addendum)
Anesthesia Evaluation  Patient identified by MRN, date of birth, ID band Patient awake    Reviewed: Allergy & Precautions, H&P , NPO status , Patient's Chart, lab work & pertinent test results  Airway Mallampati: II   Neck ROM: full    Dental   Pulmonary Current Smoker,  Lung CA   breath sounds clear to auscultation       Cardiovascular hypertension, + CAD, + Past MI, + Peripheral Vascular Disease, +CHF and + DVT   Rhythm:regular Rate:Normal  Echo 01/14/21: IMPRESSIONS  1. Left ventricular ejection fraction, by estimation, is 60 to 65%. The  left ventricle has normal function. The left ventricle has no regional  wall motion abnormalities. There is mild asymmetric left ventricular  hypertrophy of the basal-septal segment.  Left ventricular diastolic parameters are consistent with Grade I  diastolic dysfunction (impaired relaxation).  2. Right ventricular systolic function is normal. The right ventricular  size is normal. There is normal pulmonary artery systolic pressure. The  estimated right ventricular systolic pressure is 85.8 mmHg.  3. The mitral valve is normal in structure. Trivial mitral valve  regurgitation. No evidence of mitral stenosis.  4. The aortic valve is tricuspid. Aortic valve regurgitation is not  visualized. No aortic stenosis is present.  5. The inferior vena cava is normal in size with greater than 50%  respiratory variability, suggesting right atrial pressure of 3 mmHg.   Comparison echocardiogram: - TTE 02/16/20 @ [facility] in setting of acute PE/DVT: LVEF 20-25%, global hypokinesis with relative akinesis of the basal to apical segments of the septum, grade II DD, abnormal ventricular septal motion c/w RV pressure overload/systolic flattening, moderately reduced RVSF, mild MR, moderate TR, moderate pulmonary hypertension, estimated PASP 45 mmHg, IVC size and inspiratory changes suggests mildly elevated RA press  5-10 mmHg, small pericardial effusion, bilateral pleural effusions;  - TTE 11/27/19 @ [facility]: LVEF > 55%, mild MR, mild pulmonary hypertension, normal RV systolic function, no interatrial communication or intrapulmonary shunt by agitated saline contrast study   Cardiac cath 02/20/20 [facility]): Hemodynamics: Aortic pressure: 107/64 mm Hg (mean 82 mm Hg)  LVEDP = 13 mm Hg  Coronary Angiography:  Dominance: Left  Left Main: The left main coronary artery (LMCA) is a large-caliber vessel that originates from the left coronary sinus. It bifurcates into the left anterior descending (LAD) and left circumflex (LCx) arteries. There is no angiographic evidence of significant disease in the LMCA. LAD: The LAD is a large-caliber vessel that gives off 3 diagonal (D) branches before it wraps around the apex. D1 is a large-caliber vessel. D2 is a moderate-caliber vessel with a 50% stenosis. D3 is a small-caliber vessel. There is a long 25% stenosis in the proximal LAD and a focal 50% stenosis in the mid-LAD. Left Circumflex: The LCx is a large-caliber vessel that gives off 2 obtuse marginal (OM) branches and then continues as a small vessel in the AV groove. OM1 is a large-caliber branching vessel with an 80% proximal stenosis. OM2 is a small-caliber vessel. Right Coronary: The right coronary artery (RCA) is a large-caliber vessel originating from the right coronary sinus. There is a total occlusion of the proximal RCA, with left to right collaterals.  Findings:  1. Coronary artery disease including occluded proximal RCA (distal vessel  fills via left to right collaterals), 50% mid LAD stenosis and 80% OM1  stenosis.  2. Normal left ventricular filling pressures (LVEDP = 13 mm Hg).   Recommendations:  1. Aggressive secondary prevention.  2.  Medical management on CAD and CHF.  3. Follow up with primary cardiologist.      Neuro/Psych PSYCHIATRIC DISORDERS Depression    GI/Hepatic   Endo/Other  diabetes   Renal/GU Renal InsufficiencyRenal disease     Musculoskeletal   Abdominal   Peds  Hematology  (+) Blood dyscrasia, anemia ,   Anesthesia Other Findings   Reproductive/Obstetrics                           Anesthesia Physical Anesthesia Plan  ASA: 3  Anesthesia Plan: General   Post-op Pain Management:    Induction: Intravenous  PONV Risk Score and Plan: 2 and Ondansetron, Dexamethasone and Treatment may vary due to age or medical condition  Airway Management Planned: Double Lumen EBT  Additional Equipment: Arterial line, CVP and Ultrasound Guidance Line Placement  Intra-op Plan:   Post-operative Plan: Extubation in OR  Informed Consent: I have reviewed the patients History and Physical, chart, labs and discussed the procedure including the risks, benefits and alternatives for the proposed anesthesia with the patient or authorized representative who has indicated his/her understanding and acceptance.     Dental advisory given  Plan Discussed with: CRNA, Anesthesiologist and Surgeon  Anesthesia Plan Comments: (See PAT note written 01/14/2021 by Myra Gianotti, PA-C. )       Anesthesia Quick Evaluation

## 2021-01-14 NOTE — Progress Notes (Addendum)
Anesthesia Chart Review:  Case: [id number] Date/Time: 01/17/21 0715   Procedure: XI ROBOTIC ASSISTED THORASCOPY-LEFT LOWER LOBECTOMY (Left: Chest)   Anesthesia type: General   Pre-op diagnosis: LLL ADENOCARCINOMA   Location: [facility] OR ROOM 10 / [facility]   Surgeons: Melrose Nakayama, MD       DISCUSSION: Patient is a 65 year old female scheduled for the above procedure. 11/17/20 CT for hematuria evaluation had an incidental finding of a LLL lung nodule. She was referred to oncology who referred her to IR for CT-guided lung biopsy. 12/23/20 LLL lung biopsy was + non-small cell carcinoma, favor adenocarcinoma. She was evaluated by Dr. Roxan Hockey on 01/10/21. Patient preferred surgery over stereotactic radiation given "better chance for cure".   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 Difficile (01/25/20).   - Admitted to [facility] on 02/15/20-02/24/20 for dyspnea x 3 days and diagnosed with multiple right segmental and subsegmental PEs without evidence of right heart strain, RLE DVT of proximal veins, and bilateral pleural effusions R>L. Echo also showed new HFeEF with EF 20-25% (> 55% 11/2019) and grade II diastolic dysfunction. Cardiology was consulted and she underwent LHC on 02/20/20 showing occluded proximal RCA with left-to-right collaterals, 50% mid LAD stenosis and 80% OM1 stenosis.  Medical therapy recommended.  She underwent LHC DVT/PE felt to be provoked given recent right BKA and prolonged  immobilization. 3 month course of apixaban recommended. DM regimen adjusted due to A1c 11.9%.   Dr. Roxan Hockey classified her Zubrod Score as 1: Restricted in physical strenuous activity but ambulatory, able to do light work.   Dr. Roxan Hockey notes, "She does have a history of MI back in 2009 but has not had any cardiac issues since.  She is not having any chest pain, pressure, tightness, or shortness of breath with exertion.  There is no indication for any additional cardiac work-up at this time but she does have increased risk for perioperative cardiac complications.  She also has an increased risk for perioperative DVT/PE." It's unclear to me if Dr. Roxan Hockey has reviewed 02/16/20 echo report or 02/20/20 LHC for new HFEF. No PCI recommended, and new LV dysfunction was in the setting of acute DVT/PE, although with known CAD but occluded RCA appears to have been chronic since at least 2012. No concern for active CV/HF symptoms by his recent evaluation. No longer with pleural effusions by CXR. Discussed with anesthesiologist Albertha Ghee, MD. If no acute change in her status, would defer decision for additional preoperative testing to Dr. Roxan Hockey given her cardiac testing within the past year and no coronary intervention recommended. Case is also time sensitive given lung cancer diagnosis. She remains on ASA, statin, Toprol, Imdur, hydralazine, spironolactone. She is not on an ACEi, possibly due to CKD. She has upcoming cardiology visit (routine referral by PCP given history, not due to acute symptoms) on 01/24/21 with Ramm, Cassandra, AGNP to get re-established with cardiology. I've sent a staff message to Dr. Roxan Hockey regarding above and also communicated this to Allyssa at his office.  PFTs are  scheduled for 01/14/21 at 11:00 AM.  01/13/2021 presurgical COVID-19 test negative.  Anesthesia team to evaluate on the day of surgery.  ADDENDUM 01/14/21 5:25 PM: I was able to speak with patient after her  PFTs. Dr. Roxan Hockey would like her to get an echo today. Office was able to arrange for 1:00 PM. She did not eat breakfast this morning, and felt like her glucose was low. She had an orange with her and began to eat it. After I transported her to the PAT waiting area, her CBG was 119. She also was given juice and a boxed lunch. Afterwards she had her echo which showed normalization of her LVEF, no regional wall motion abnormalities, grade I DD, normal RVSF, normal PASP, trivial MR.    VS: BP 131/71   Pulse 75   Temp 36.7 C   Resp 18   Ht '5\' 5"'  (1.651 m)   Wt 55.2 kg   SpO2 100%   BMI 20.24 kg/m    PROVIDERS: [name], MD is PCP. Established care 07/15/20. Earlie Server, MD is HEM-ONC  - Elige Radon, MD is vascular surgeron [facility]) She has an appointment with Everitt Amber with [facility] Cardiology on 01/24/21. (She was referred after 07/15/20 new PCP visit, but appears she did not show for appointments in March or May 2022.)  - She has an appointment with Otilio Miu, MD with [facility] on 01/25/21 - Sherlynn Stalls, ANP is nephrology proider with [facility]. Last visit 07/21/20. Nickolas Madrid, MD is urologist   LABS: Labs reviewed: Acceptable for surgery. Renal function and anemia appear stable. A1c 8.0% is down from 10.6% on 07/21/20.  (all labs ordered are listed, but only abnormal results are displayed)  Labs Reviewed  GLUCOSE, CAPILLARY - Abnormal; Notable for the following components:      Result Value   Glucose-Capillary 178 (*)    All other components within normal limits  CBC - Abnormal; Notable for the following components:   RBC 3.51 (*)    Hemoglobin 10.2 (*)    HCT 33.0 (*)    All other components within normal limits  COMPREHENSIVE METABOLIC PANEL - Abnormal; Notable for the following components:   CO2 21 (*)    Glucose, Bld 184 (*)    BUN 50 (*)    Creatinine, Ser 1.50 (*)    Calcium 8.8 (*)    Total Protein 6.4 (*)    Albumin 3.4 (*)     Total Bilirubin 0.2 (*)    GFR, Estimated 39 (*)    All other components within normal limits  BLOOD GAS, ARTERIAL - Abnormal; Notable for the following components:   pH, Arterial 7.325 (*)    pO2, Arterial 141 (*)    Acid-base deficit 4.2 (*)    Allens test (pass/fail) BRACHIAL ARTERY (*)    All other components within normal limits  APTT - Abnormal; Notable for the following components:   aPTT 23 (*)    All other components within normal limits  URINALYSIS, ROUTINE W REFLEX MICROSCOPIC - Abnormal; Notable for the following components:   Glucose, UA >=500 (*)    Protein, ur 100 (*)    All other components within normal limits  HEMOGLOBIN A1C - Abnormal; Notable for the following components:   Hgb A1c MFr Bld 8.0 (*)    All other components within normal limits  SURGICAL PCR SCREEN  SARS CORONAVIRUS 2 (TAT 6-24 HRS)  PROTIME-INR  TYPE AND SCREEN  PFTs 01/14/21: FVC 3.06 (112%), post 2.86 (105%). FEV1 2.35 (110%), post 2.39 (112%), DLCO unc 10.36 (49%).   IMAGES: CXR 01/13/21: FINDINGS: - Cardiomediastinal silhouette unchanged in size and contour. No evidence of central vascular congestion. No interlobular septal thickening. - Known left-sided lung nodule not well visualized on the plain film - No pneumothorax or pleural effusion. Coarsened interstitial markings, with no confluent airspace disease. - No acute displaced fracture. Degenerative changes of the spine. IMPRESSION: No active cardiopulmonary disease.   PET Scan 12/07/20: IMPRESSION: 1. 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 2. Mild uniform thyroid hypermetabolism. Laboratory correlation may be helpful in further evaluation, as clinically indicated. 3. Aortic atherosclerosis (ICD10-I70.0). Coronary artery calcification.    EKG: 01/13/21: NSR   CV: Echo 01/14/21: IMPRESSIONS   1. Left ventricular ejection fraction, by estimation, is 60 to 65%. The   left ventricle has normal function. The left ventricle has no regional  wall motion abnormalities. There is mild asymmetric left ventricular  hypertrophy of the basal-septal segment.  Left ventricular diastolic parameters are consistent with Grade I  diastolic dysfunction (impaired relaxation).   2. Right ventricular systolic function is normal. The right ventricular  size is normal. There is normal pulmonary artery systolic pressure. The  estimated right ventricular systolic pressure is 80.8 mmHg.   3. The mitral valve is normal in structure. Trivial mitral valve  regurgitation. No evidence of mitral stenosis.   4. The aortic valve is tricuspid. Aortic valve regurgitation is not  visualized. No aortic stenosis is present.   5. The inferior vena cava is normal in size with greater than 50%  respiratory variability, suggesting right atrial pressure of 3 mmHg.  Comparison echocardiogram: - TTE 02/16/20 @ [facility] in setting of acute PE/DVT: LVEF 20-25%, global hypokinesis with relative akinesis of the basal to apical segments of the septum, grade II DD, abnormal ventricular septal motion c/w RV pressure overload/systolic flattening, moderately reduced RVSF, mild MR, moderate TR, moderate pulmonary hypertension, estimated PASP 45 mmHg, IVC size and inspiratory changes suggests mildly elevated RA press 5-10 mmHg, small pericardial effusion, bilateral pleural effusions;  - TTE 11/27/19 @ [facility]: LVEF > 55%, mild MR, mild pulmonary hypertension, normal RV systolic function, no interatrial communication or intrapulmonary shunt by agitated saline contrast study   Cardiac cath 02/20/20 [facility]): Hemodynamics: Aortic pressure: 107/64 mm Hg (mean 82 mm Hg)  LVEDP = 13 mm Hg  Coronary Angiography:  Dominance: Left  Left Main: The left main coronary artery (LMCA) is a large-caliber vessel that originates from the left coronary sinus. It bifurcates into the left anterior descending (LAD) and left circumflex (LCx)  arteries. There is no angiographic evidence of significant disease in the LMCA. LAD: The LAD is a large-caliber vessel that gives off 3 diagonal (D) branches before it wraps around the apex. D1 is a large-caliber vessel. D2 is a moderate-caliber vessel with a 50% stenosis. D3 is a small-caliber vessel. There is a long 25% stenosis in the proximal LAD and a focal 50% stenosis in the mid-LAD. Left Circumflex: The LCx is a large-caliber vessel that gives off 2 obtuse marginal (OM) branches and then continues as a small vessel in the AV groove. OM1 is a large-caliber branching vessel with an 80% proximal stenosis. OM2 is a small-caliber vessel. Right Coronary: The right coronary artery (RCA) is a large-caliber vessel originating from the right coronary sinus. There is a total occlusion of the proximal RCA, with left to right collaterals.  Findings:  1. Coronary artery disease including occluded proximal RCA (distal vessel  fills via left to right collaterals), 50% mid LAD stenosis and 80% OM1  stenosis.  2. Normal left ventricular filling pressures (LVEDP = 13 mm Hg).   Recommendations:  1. Aggressive secondary prevention.  2. Medical management on CAD and CHF.  3. Follow up with primary cardiologist.     Past Medical History:  Diagnosis Date   Anemia    Cancer ([place])    CHF (congestive heart failure) ([place])    02/16/20 HFrEF 20-25% in setting of acute DVT/PE, underlying CAD   CKD (chronic kidney disease)    Coronary artery disease    02/20/20: CTO pRCA with left-to-right collaterals, 50% mLAD, 80% OM1, medical therapy   Depression    Diabetes ([place])    Diabetes mellitus without complication ([place])    DVT (deep venous thrombosis) ([place]) 02/16/2020   RLE DVT proximal veins 02/16/20 Duplex   Hyperlipidemia    Hypertension    Myocardial infarction [facility]) 2009   Stress related per patient; NSTEMI 2012 100% RCA with left-to-right collaterals   PE (pulmonary thromboembolism) ([place]) 02/15/2020   multiple  Right PE in 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, RLE BKA   Peripheral artery disease ([facility])     Past Surgical History:  Procedure Laterality Date   Right lower extremity BKA Right     MEDICATIONS:  ACCU-CHEK GUIDE test strip   Accu-Chek Softclix Lancets lancets   aspirin EC 81 MG tablet   atorvastatin (LIPITOR) 80 MG tablet   blood glucose meter kit and supplies KIT   carboxymethylcellulose (REFRESH PLUS) 0.5 % SOLN   diphenhydrAMINE (BENADRYL) 25 MG tablet   DULoxetine (CYMBALTA) 20 MG capsule   gabapentin (NEURONTIN) 100 MG capsule   HUMALOG KWIKPEN 100 UNIT/ML KwikPen   hydrALAZINE (APRESOLINE) 100 MG tablet   isosorbide dinitrate (ISORDIL) 30 MG tablet   JARDIANCE 10 MG TABS tablet   ketoconazole (NIZORAL) 2 % cream   LANTUS SOLOSTAR 100 UNIT/ML Solostar Pen   metFORMIN (GLUCOPHAGE-XR) 500 MG 24 hr tablet   metoprolol succinate (TOPROL-XL) 50 MG 24 hr tablet   Multiple Vitamin (MULTIVITAMIN WITH MINERALS) TABS tablet   spironolactone (ALDACTONE) 25 MG tablet   No current facility-administered medications for this encounter.    Myra Gianotti, PA-C Surgical Short Stay/Anesthesiology [facility] Phone [phone] [facility] Phone [phone] 01/14/2021 11:00 AM

## 2021-01-14 NOTE — Progress Notes (Signed)
  Echocardiogram 2D Echocardiogram has been performed.  [name] [name] 01/14/2021, 2:00 PM

## 2021-01-17 ENCOUNTER — Inpatient Hospital Stay (HOSPITAL_COMMUNITY): Payer: Medicaid Other | Admitting: Anesthesiology

## 2021-01-17 ENCOUNTER — Inpatient Hospital Stay (HOSPITAL_COMMUNITY)
Admission: RE | Admit: 2021-01-17 | Discharge: 2021-01-23 | DRG: 164 | Disposition: A | Discharge status: Home / Self Care | Payer: Medicaid Other | Attending: Thoracic Surgery (Cardiothoracic Vascular Surgery) | Admitting: Thoracic Surgery (Cardiothoracic Vascular Surgery)

## 2021-01-17 ENCOUNTER — Other Ambulatory Visit: Payer: Self-pay

## 2021-01-17 ENCOUNTER — Encounter (HOSPITAL_COMMUNITY): Payer: Self-pay | Admitting: Thoracic Surgery (Cardiothoracic Vascular Surgery)

## 2021-01-17 ENCOUNTER — Encounter (HOSPITAL_COMMUNITY)
Admission: RE | Disposition: A | Discharge status: Home / Self Care | Payer: Self-pay | Source: Home / Self Care | Attending: Thoracic Surgery (Cardiothoracic Vascular Surgery)

## 2021-01-17 ENCOUNTER — Inpatient Hospital Stay (HOSPITAL_COMMUNITY): Payer: Medicaid Other | Admitting: Vascular Surgery

## 2021-01-17 ENCOUNTER — Inpatient Hospital Stay (HOSPITAL_COMMUNITY): Payer: Medicaid Other

## 2021-01-17 DIAGNOSIS — I5022 Chronic systolic (congestive) heart failure: Secondary | ICD-10-CM | POA: Diagnosis not present

## 2021-01-17 DIAGNOSIS — Z4682 Encounter for fitting and adjustment of non-vascular catheter: Secondary | ICD-10-CM | POA: Diagnosis not present

## 2021-01-17 DIAGNOSIS — I130 Hypertensive heart and chronic kidney disease with heart failure and stage 1 through stage 4 chronic kidney disease, or unspecified chronic kidney disease: Secondary | ICD-10-CM | POA: Diagnosis not present

## 2021-01-17 DIAGNOSIS — Z7982 Long term (current) use of aspirin: Secondary | ICD-10-CM | POA: Diagnosis not present

## 2021-01-17 DIAGNOSIS — J439 Emphysema, unspecified: Secondary | ICD-10-CM | POA: Diagnosis not present

## 2021-01-17 DIAGNOSIS — Z452 Encounter for adjustment and management of vascular access device: Secondary | ICD-10-CM | POA: Diagnosis not present

## 2021-01-17 DIAGNOSIS — Z8249 Family history of ischemic heart disease and other diseases of the circulatory system: Secondary | ICD-10-CM

## 2021-01-17 DIAGNOSIS — Z833 Family history of diabetes mellitus: Secondary | ICD-10-CM | POA: Diagnosis not present

## 2021-01-17 DIAGNOSIS — T17890A Other foreign object in other parts of respiratory tract causing asphyxiation, initial encounter: Secondary | ICD-10-CM | POA: Diagnosis not present

## 2021-01-17 DIAGNOSIS — C771 Secondary and unspecified malignant neoplasm of intrathoracic lymph nodes: Secondary | ICD-10-CM | POA: Diagnosis not present

## 2021-01-17 DIAGNOSIS — F32A Depression, unspecified: Secondary | ICD-10-CM | POA: Diagnosis present

## 2021-01-17 DIAGNOSIS — I252 Old myocardial infarction: Secondary | ICD-10-CM

## 2021-01-17 DIAGNOSIS — E785 Hyperlipidemia, unspecified: Secondary | ICD-10-CM | POA: Diagnosis present

## 2021-01-17 DIAGNOSIS — J939 Pneumothorax, unspecified: Secondary | ICD-10-CM

## 2021-01-17 DIAGNOSIS — I2510 Atherosclerotic heart disease of native coronary artery without angina pectoris: Secondary | ICD-10-CM | POA: Diagnosis not present

## 2021-01-17 DIAGNOSIS — F17290 Nicotine dependence, other tobacco product, uncomplicated: Secondary | ICD-10-CM | POA: Diagnosis present

## 2021-01-17 DIAGNOSIS — C3492 Malignant neoplasm of unspecified part of left bronchus or lung: Secondary | ICD-10-CM

## 2021-01-17 DIAGNOSIS — C3432 Malignant neoplasm of lower lobe, left bronchus or lung: Secondary | ICD-10-CM | POA: Diagnosis not present

## 2021-01-17 DIAGNOSIS — E1151 Type 2 diabetes mellitus with diabetic peripheral angiopathy without gangrene: Secondary | ICD-10-CM | POA: Diagnosis not present

## 2021-01-17 DIAGNOSIS — D62 Acute posthemorrhagic anemia: Secondary | ICD-10-CM | POA: Diagnosis not present

## 2021-01-17 DIAGNOSIS — J95811 Postprocedural pneumothorax: Secondary | ICD-10-CM | POA: Diagnosis not present

## 2021-01-17 DIAGNOSIS — J9811 Atelectasis: Secondary | ICD-10-CM | POA: Diagnosis not present

## 2021-01-17 DIAGNOSIS — J90 Pleural effusion, not elsewhere classified: Secondary | ICD-10-CM | POA: Diagnosis not present

## 2021-01-17 DIAGNOSIS — J910 Malignant pleural effusion: Secondary | ICD-10-CM | POA: Diagnosis not present

## 2021-01-17 DIAGNOSIS — Z86718 Personal history of other venous thrombosis and embolism: Secondary | ICD-10-CM

## 2021-01-17 DIAGNOSIS — R899 Unspecified abnormal finding in specimens from other organs, systems and tissues: Secondary | ICD-10-CM

## 2021-01-17 DIAGNOSIS — E1122 Type 2 diabetes mellitus with diabetic chronic kidney disease: Secondary | ICD-10-CM | POA: Diagnosis not present

## 2021-01-17 DIAGNOSIS — Z79899 Other long term (current) drug therapy: Secondary | ICD-10-CM

## 2021-01-17 DIAGNOSIS — Z89511 Acquired absence of right leg below knee: Secondary | ICD-10-CM

## 2021-01-17 DIAGNOSIS — N1832 Chronic kidney disease, stage 3b: Secondary | ICD-10-CM | POA: Diagnosis present

## 2021-01-17 DIAGNOSIS — Z902 Acquired absence of lung [part of]: Secondary | ICD-10-CM | POA: Diagnosis not present

## 2021-01-17 DIAGNOSIS — Z9049 Acquired absence of other specified parts of digestive tract: Secondary | ICD-10-CM | POA: Diagnosis not present

## 2021-01-17 DIAGNOSIS — Z7984 Long term (current) use of oral hypoglycemic drugs: Secondary | ICD-10-CM | POA: Diagnosis not present

## 2021-01-17 DIAGNOSIS — Z794 Long term (current) use of insulin: Secondary | ICD-10-CM

## 2021-01-17 DIAGNOSIS — R918 Other nonspecific abnormal finding of lung field: Secondary | ICD-10-CM | POA: Diagnosis not present

## 2021-01-17 DIAGNOSIS — Z86711 Personal history of pulmonary embolism: Secondary | ICD-10-CM

## 2021-01-17 DIAGNOSIS — J948 Other specified pleural conditions: Secondary | ICD-10-CM | POA: Diagnosis not present

## 2021-01-17 DIAGNOSIS — C3490 Malignant neoplasm of unspecified part of unspecified bronchus or lung: Principal | ICD-10-CM

## 2021-01-17 DIAGNOSIS — I509 Heart failure, unspecified: Secondary | ICD-10-CM | POA: Diagnosis not present

## 2021-01-17 DIAGNOSIS — N189 Chronic kidney disease, unspecified: Secondary | ICD-10-CM | POA: Diagnosis not present

## 2021-01-17 LAB — GLUCOSE, CAPILLARY
Glucose-Capillary: 74 mg/dL (ref 70–99)
Glucose-Capillary: 88 mg/dL (ref 70–99)
Glucose-Capillary: 273 mg/dL — ABNORMAL HIGH (ref 70–99)
Glucose-Capillary: 266 mg/dL — ABNORMAL HIGH (ref 70–99)
Glucose-Capillary: 185 mg/dL — ABNORMAL HIGH (ref 70–99)
Glucose-Capillary: 248 mg/dL — ABNORMAL HIGH (ref 70–99)

## 2021-01-17 LAB — ABO/RH: ABO/RH(D): B POS

## 2021-01-17 SURGERY — LOBECTOMY, LUNG, ROBOT-ASSISTED, USING VATS
Anesthesia: General | Site: Chest | Laterality: Left

## 2021-01-17 MED ORDER — ALBUTEROL SULFATE (2.5 MG/3ML) 0.083% IN NEBU: 2.5000 mg | INHALATION_SOLUTION | Freq: Four times a day (QID) | RESPIRATORY_TRACT | Status: DC

## 2021-01-17 MED ORDER — DULOXETINE HCL 20 MG PO CPEP
40.0000 mg | ORAL_CAPSULE | Freq: Two times a day (BID) | ORAL | Status: DC
  Administered 2021-01-17 – 2021-01-23 (×12): 40 mg via ORAL
  Filled 2021-01-17 (×13): qty 2

## 2021-01-17 MED ORDER — FENTANYL CITRATE (PF) 100 MCG/2ML IJ SOLN
INTRAMUSCULAR | Status: AC
  Filled 2021-01-17: qty 2

## 2021-01-17 MED ORDER — ROCURONIUM BROMIDE 100 MG/10ML IV SOLN
INTRAVENOUS | Status: DC | PRN
  Administered 2021-01-17: 20 mg via INTRAVENOUS
  Administered 2021-01-17 (×3): 10 mg via INTRAVENOUS
  Administered 2021-01-17: 50 mg via INTRAVENOUS

## 2021-01-17 MED ORDER — DEXMEDETOMIDINE (PRECEDEX) IN NS 20 MCG/5ML (4 MCG/ML) IV SYRINGE
PREFILLED_SYRINGE | INTRAVENOUS | Status: DC | PRN
  Administered 2021-01-17: 8 ug via INTRAVENOUS
  Administered 2021-01-17: 4 ug via INTRAVENOUS

## 2021-01-17 MED ORDER — METOPROLOL SUCCINATE ER 50 MG PO TB24
50.0000 mg | ORAL_TABLET | Freq: Every day | ORAL | Status: DC
  Administered 2021-01-18 – 2021-01-23 (×6): 50 mg via ORAL
  Filled 2021-01-17 (×6): qty 1

## 2021-01-17 MED ORDER — KETOROLAC TROMETHAMINE 15 MG/ML IJ SOLN
15.0000 mg | Freq: Once | INTRAMUSCULAR | Status: AC
  Administered 2021-01-17: 15 mg via INTRAVENOUS

## 2021-01-17 MED ORDER — ONDANSETRON HCL 4 MG/2ML IJ SOLN
INTRAMUSCULAR | Status: AC
  Filled 2021-01-17: qty 2

## 2021-01-17 MED ORDER — PHENYLEPHRINE HCL (PRESSORS) 10 MG/ML IV SOLN
INTRAVENOUS | Status: DC | PRN
  Administered 2021-01-17: 40 ug via INTRAVENOUS
  Administered 2021-01-17: 80 ug via INTRAVENOUS

## 2021-01-17 MED ORDER — SODIUM CHLORIDE 0.9 % IR SOLN
Status: DC | PRN
  Administered 2021-01-17: 1000 mL

## 2021-01-17 MED ORDER — ATORVASTATIN CALCIUM 80 MG PO TABS
80.0000 mg | ORAL_TABLET | Freq: Every day | ORAL | Status: DC
  Administered 2021-01-18 – 2021-01-23 (×6): 80 mg via ORAL
  Filled 2021-01-17 (×6): qty 1

## 2021-01-17 MED ORDER — CEFAZOLIN SODIUM-DEXTROSE 2-4 GM/100ML-% IV SOLN
2.0000 g | Freq: Three times a day (TID) | INTRAVENOUS | Status: AC
  Administered 2021-01-17 – 2021-01-18 (×2): 2 g via INTRAVENOUS
  Filled 2021-01-17 (×2): qty 100

## 2021-01-17 MED ORDER — SUGAMMADEX SODIUM 200 MG/2ML IV SOLN
INTRAVENOUS | Status: DC | PRN
  Administered 2021-01-17: 200 mg via INTRAVENOUS

## 2021-01-17 MED ORDER — PHENYLEPHRINE HCL-NACL 10-0.9 MG/250ML-% IV SOLN
INTRAVENOUS | Status: DC | PRN
  Administered 2021-01-17: 20 ug/min via INTRAVENOUS

## 2021-01-17 MED ORDER — ALBUTEROL SULFATE (2.5 MG/3ML) 0.083% IN NEBU
2.5000 mg | INHALATION_SOLUTION | RESPIRATORY_TRACT | Status: DC
  Administered 2021-01-17: 2.5 mg via RESPIRATORY_TRACT
  Filled 2021-01-17: qty 3

## 2021-01-17 MED ORDER — MIDAZOLAM HCL 2 MG/2ML IJ SOLN
INTRAMUSCULAR | Status: AC
  Filled 2021-01-17: qty 2

## 2021-01-17 MED ORDER — BISACODYL 5 MG PO TBEC
10.0000 mg | DELAYED_RELEASE_TABLET | Freq: Every day | ORAL | Status: DC
  Administered 2021-01-18 – 2021-01-23 (×4): 10 mg via ORAL
  Filled 2021-01-17 (×5): qty 2

## 2021-01-17 MED ORDER — MIDAZOLAM HCL 5 MG/5ML IJ SOLN
INTRAMUSCULAR | Status: DC | PRN
  Administered 2021-01-17 (×2): 1 mg via INTRAVENOUS

## 2021-01-17 MED ORDER — CEFAZOLIN SODIUM-DEXTROSE 2-4 GM/100ML-% IV SOLN
2.0000 g | INTRAVENOUS | Status: AC
  Administered 2021-01-17: 2 g via INTRAVENOUS

## 2021-01-17 MED ORDER — ADULT MULTIVITAMIN W/MINERALS CH
1.0000 | ORAL_TABLET | Freq: Every day | ORAL | Status: DC
  Administered 2021-01-18 – 2021-01-23 (×6): 1 via ORAL
  Filled 2021-01-17 (×6): qty 1

## 2021-01-17 MED ORDER — ONDANSETRON HCL 4 MG/2ML IJ SOLN
4.0000 mg | Freq: Four times a day (QID) | INTRAMUSCULAR | Status: DC | PRN
  Administered 2021-01-20: 4 mg via INTRAVENOUS
  Filled 2021-01-17: qty 2

## 2021-01-17 MED ORDER — OXYCODONE HCL 5 MG/5ML PO SOLN: 5.0000 mg | Freq: Once | ORAL | Status: DC | PRN

## 2021-01-17 MED ORDER — ACETAMINOPHEN 500 MG PO TABS
1000.0000 mg | ORAL_TABLET | Freq: Four times a day (QID) | ORAL | Status: AC
  Administered 2021-01-18 – 2021-01-22 (×18): 1000 mg via ORAL
  Filled 2021-01-17 (×18): qty 2

## 2021-01-17 MED ORDER — KETOCONAZOLE 2 % EX CREA: 1.0000 "application " | TOPICAL_CREAM | Freq: Every day | CUTANEOUS | Status: DC

## 2021-01-17 MED ORDER — ONDANSETRON HCL 4 MG/2ML IJ SOLN
INTRAMUSCULAR | Status: DC | PRN
  Administered 2021-01-17: 4 mg via INTRAVENOUS

## 2021-01-17 MED ORDER — METOCLOPRAMIDE HCL 5 MG/ML IJ SOLN
10.0000 mg | Freq: Four times a day (QID) | INTRAMUSCULAR | Status: AC
  Administered 2021-01-18 (×4): 10 mg via INTRAVENOUS
  Filled 2021-01-17 (×4): qty 2

## 2021-01-17 MED ORDER — HYDRALAZINE HCL 20 MG/ML IJ SOLN: 10.0000 mg | Freq: Four times a day (QID) | INTRAMUSCULAR | Status: DC | PRN

## 2021-01-17 MED ORDER — LABETALOL HCL 5 MG/ML IV SOLN
INTRAVENOUS | Status: AC
  Filled 2021-01-17: qty 4

## 2021-01-17 MED ORDER — ACETAMINOPHEN 160 MG/5ML PO SOLN
1000.0000 mg | Freq: Four times a day (QID) | ORAL | Status: AC
Start: 2021-01-18 — End: 2021-01-22

## 2021-01-17 MED ORDER — DEXAMETHASONE SODIUM PHOSPHATE 10 MG/ML IJ SOLN
INTRAMUSCULAR | Status: DC | PRN
  Administered 2021-01-17: 5 mg via INTRAVENOUS

## 2021-01-17 MED ORDER — GABAPENTIN 100 MG PO CAPS
100.0000 mg | ORAL_CAPSULE | Freq: Two times a day (BID) | ORAL | Status: DC
  Administered 2021-01-17 – 2021-01-23 (×12): 100 mg via ORAL
  Filled 2021-01-17 (×12): qty 1

## 2021-01-17 MED ORDER — ONDANSETRON HCL 4 MG/2ML IJ SOLN: 4.0000 mg | Freq: Four times a day (QID) | INTRAMUSCULAR | Status: DC | PRN

## 2021-01-17 MED ORDER — OXYCODONE HCL 5 MG PO TABS
5.0000 mg | ORAL_TABLET | ORAL | Status: DC | PRN
  Administered 2021-01-18 (×3): 5 mg via ORAL
  Administered 2021-01-19 – 2021-01-20 (×3): 10 mg via ORAL
  Administered 2021-01-20: 5 mg via ORAL
  Administered 2021-01-21 – 2021-01-23 (×5): 10 mg via ORAL
  Filled 2021-01-17: qty 2
  Filled 2021-01-17 (×2): qty 1
  Filled 2021-01-17 (×4): qty 2
  Filled 2021-01-17: qty 1
  Filled 2021-01-17 (×4): qty 2

## 2021-01-17 MED ORDER — TRAMADOL HCL 50 MG PO TABS
50.0000 mg | ORAL_TABLET | Freq: Four times a day (QID) | ORAL | Status: DC | PRN
Start: 2021-01-17 — End: 2021-01-23
  Administered 2021-01-20 – 2021-01-21 (×2): 50 mg via ORAL
  Filled 2021-01-17 (×2): qty 1

## 2021-01-17 MED ORDER — CHLORHEXIDINE GLUCONATE 0.12 % MT SOLN
OROMUCOSAL | Status: AC
  Administered 2021-01-17: 15 mL via OROMUCOSAL
  Filled 2021-01-17: qty 15

## 2021-01-17 MED ORDER — SODIUM CHLORIDE FLUSH 0.9 % IV SOLN
INTRAVENOUS | Status: DC | PRN
  Administered 2021-01-17: 100 mL

## 2021-01-17 MED ORDER — ROCURONIUM BROMIDE 10 MG/ML (PF) SYRINGE
PREFILLED_SYRINGE | INTRAVENOUS | Status: AC
  Filled 2021-01-17: qty 10

## 2021-01-17 MED ORDER — CHLORHEXIDINE GLUCONATE 0.12 % MT SOLN: 15.0000 mL | Freq: Once | OROMUCOSAL | Status: AC

## 2021-01-17 MED ORDER — CHLORHEXIDINE GLUCONATE CLOTH 2 % EX PADS
6.0000 | MEDICATED_PAD | Freq: Every day | CUTANEOUS | Status: DC
  Administered 2021-01-18 – 2021-01-21 (×3): 6 via TOPICAL

## 2021-01-17 MED ORDER — LIDOCAINE 2% (20 MG/ML) 5 ML SYRINGE
INTRAMUSCULAR | Status: AC
  Filled 2021-01-17: qty 5

## 2021-01-17 MED ORDER — INSULIN ASPART 100 UNIT/ML IJ SOLN
INTRAMUSCULAR | Status: AC
  Filled 2021-01-17: qty 1

## 2021-01-17 MED ORDER — KETOROLAC TROMETHAMINE 15 MG/ML IJ SOLN
INTRAMUSCULAR | Status: AC
  Filled 2021-01-17: qty 1

## 2021-01-17 MED ORDER — SENNOSIDES-DOCUSATE SODIUM 8.6-50 MG PO TABS
1.0000 | ORAL_TABLET | Freq: Every day | ORAL | Status: DC
  Administered 2021-01-17 – 2021-01-22 (×4): 1 via ORAL
  Filled 2021-01-17 (×6): qty 1

## 2021-01-17 MED ORDER — PROPOFOL 10 MG/ML IV BOLUS
INTRAVENOUS | Status: AC
  Filled 2021-01-17: qty 20

## 2021-01-17 MED ORDER — LACTATED RINGERS IV SOLN: INTRAVENOUS | Status: DC

## 2021-01-17 MED ORDER — PHENYLEPHRINE 40 MCG/ML (10ML) SYRINGE FOR IV PUSH (FOR BLOOD PRESSURE SUPPORT)
PREFILLED_SYRINGE | INTRAVENOUS | Status: AC
  Filled 2021-01-17: qty 10

## 2021-01-17 MED ORDER — BUPIVACAINE LIPOSOME 1.3 % IJ SUSP
INTRAMUSCULAR | Status: AC
  Filled 2021-01-17: qty 20

## 2021-01-17 MED ORDER — INSULIN ASPART 100 UNIT/ML IJ SOLN
0.0000 [IU] | INTRAMUSCULAR | Status: DC
  Administered 2021-01-17: 8 [IU] via SUBCUTANEOUS
  Administered 2021-01-17: 4 [IU] via SUBCUTANEOUS
  Administered 2021-01-18: 2 [IU] via SUBCUTANEOUS

## 2021-01-17 MED ORDER — LIDOCAINE 2% (20 MG/ML) 5 ML SYRINGE
INTRAMUSCULAR | Status: DC | PRN
  Administered 2021-01-17: 50 mg via INTRAVENOUS

## 2021-01-17 MED ORDER — INSULIN GLARGINE 100 UNIT/ML ~~LOC~~ SOLN
16.0000 [IU] | Freq: Every day | SUBCUTANEOUS | Status: DC
  Administered 2021-01-17 – 2021-01-19 (×3): 16 [IU] via SUBCUTANEOUS
  Administered 2021-01-22: 8 [IU] via SUBCUTANEOUS
  Filled 2021-01-17 (×8): qty 0.16

## 2021-01-17 MED ORDER — BUPIVACAINE HCL (PF) 0.5 % IJ SOLN
INTRAMUSCULAR | Status: AC
  Filled 2021-01-17: qty 30

## 2021-01-17 MED ORDER — ORAL CARE MOUTH RINSE: 15.0000 mL | Freq: Once | OROMUCOSAL | Status: AC

## 2021-01-17 MED ORDER — ENOXAPARIN SODIUM 40 MG/0.4ML IJ SOSY
40.0000 mg | PREFILLED_SYRINGE | Freq: Every day | INTRAMUSCULAR | Status: DC
  Administered 2021-01-18 – 2021-01-19 (×2): 40 mg via SUBCUTANEOUS
  Filled 2021-01-17 (×2): qty 0.4

## 2021-01-17 MED ORDER — FENTANYL CITRATE (PF) 100 MCG/2ML IJ SOLN
25.0000 ug | INTRAMUSCULAR | Status: DC | PRN
  Administered 2021-01-17: 50 ug via INTRAVENOUS
  Administered 2021-01-17 (×3): 25 ug via INTRAVENOUS

## 2021-01-17 MED ORDER — PROPOFOL 10 MG/ML IV BOLUS
INTRAVENOUS | Status: DC | PRN
  Administered 2021-01-17: 100 mg via INTRAVENOUS
  Administered 2021-01-17: 40 mg via INTRAVENOUS
  Administered 2021-01-17: 30 mg via INTRAVENOUS
  Administered 2021-01-17: 20 mg via INTRAVENOUS

## 2021-01-17 MED ORDER — SODIUM CHLORIDE 0.45 % IV SOLN: INTRAVENOUS | Status: DC

## 2021-01-17 MED ORDER — DEXAMETHASONE SODIUM PHOSPHATE 10 MG/ML IJ SOLN
INTRAMUSCULAR | Status: AC
  Filled 2021-01-17: qty 1

## 2021-01-17 MED ORDER — INSULIN ASPART 100 UNIT/ML IJ SOLN
12.0000 [IU] | Freq: Once | INTRAMUSCULAR | Status: AC
  Administered 2021-01-17: 12 [IU] via SUBCUTANEOUS

## 2021-01-17 MED ORDER — FENTANYL CITRATE (PF) 250 MCG/5ML IJ SOLN
INTRAMUSCULAR | Status: DC | PRN
  Administered 2021-01-17 (×2): 50 ug via INTRAVENOUS
  Administered 2021-01-17: 100 ug via INTRAVENOUS

## 2021-01-17 MED ORDER — ISOSORBIDE DINITRATE 10 MG PO TABS
30.0000 mg | ORAL_TABLET | Freq: Three times a day (TID) | ORAL | Status: DC
  Administered 2021-01-18 – 2021-01-23 (×15): 30 mg via ORAL
  Filled 2021-01-17 (×15): qty 3

## 2021-01-17 MED ORDER — SPIRONOLACTONE 25 MG PO TABS
25.0000 mg | ORAL_TABLET | Freq: Every day | ORAL | Status: DC
  Administered 2021-01-18 – 2021-01-23 (×6): 25 mg via ORAL
  Filled 2021-01-17 (×6): qty 1

## 2021-01-17 MED ORDER — POLYVINYL ALCOHOL 1.4 % OP SOLN
1.0000 [drp] | Freq: Two times a day (BID) | OPHTHALMIC | Status: DC
  Administered 2021-01-17 – 2021-01-23 (×12): 1 [drp] via OPHTHALMIC
  Filled 2021-01-17 (×2): qty 15

## 2021-01-17 MED ORDER — 0.9 % SODIUM CHLORIDE (POUR BTL) OPTIME
TOPICAL | Status: DC | PRN
  Administered 2021-01-17: 2000 mL

## 2021-01-17 MED ORDER — LABETALOL HCL 5 MG/ML IV SOLN
INTRAVENOUS | Status: DC | PRN
  Administered 2021-01-17 (×2): 5 mg via INTRAVENOUS

## 2021-01-17 MED ORDER — CEFAZOLIN SODIUM-DEXTROSE 2-4 GM/100ML-% IV SOLN
INTRAVENOUS | Status: AC
  Filled 2021-01-17: qty 100

## 2021-01-17 MED ORDER — DEXMEDETOMIDINE HCL IN NACL 200 MCG/50ML IV SOLN
INTRAVENOUS | Status: AC
  Filled 2021-01-17: qty 50

## 2021-01-17 MED ORDER — INSULIN GLARGINE 100 UNIT/ML SOLOSTAR PEN: 16.0000 [IU] | PEN_INJECTOR | Freq: Every day | SUBCUTANEOUS | Status: DC

## 2021-01-17 MED ORDER — OXYCODONE HCL 5 MG PO TABS: 5.0000 mg | ORAL_TABLET | Freq: Once | ORAL | Status: DC | PRN

## 2021-01-17 MED ORDER — LACTATED RINGERS IV SOLN
INTRAVENOUS | Status: DC | PRN
Start: 2021-01-17 — End: 2021-01-17

## 2021-01-17 MED ORDER — FENTANYL CITRATE (PF) 250 MCG/5ML IJ SOLN
INTRAMUSCULAR | Status: AC
  Filled 2021-01-17: qty 5

## 2021-01-17 MED ORDER — ASPIRIN EC 81 MG PO TBEC
81.0000 mg | DELAYED_RELEASE_TABLET | Freq: Every day | ORAL | Status: DC
  Administered 2021-01-18 – 2021-01-23 (×6): 81 mg via ORAL
  Filled 2021-01-17 (×6): qty 1

## 2021-01-17 MED ORDER — DIPHENHYDRAMINE HCL 25 MG PO CAPS
25.0000 mg | ORAL_CAPSULE | Freq: Every day | ORAL | Status: DC
  Administered 2021-01-17 – 2021-01-22 (×6): 25 mg via ORAL
  Filled 2021-01-17 (×6): qty 1

## 2021-01-17 SURGICAL SUPPLY — 113 items
BIT DRILL 7/64 SS (BIT) ×2 IMPLANT
BLADE CLIPPER SURG (BLADE) IMPLANT
CANISTER SUCT 3000ML PPV (MISCELLANEOUS) ×4 IMPLANT
CANNULA REDUC XI 12-8 STAPL (CANNULA) ×2
CANNULA REDUCER 12-8 DVNC XI (CANNULA) ×2 IMPLANT
CATH THORACIC 28FR (CATHETERS) IMPLANT
CATH THORACIC 28FR RT ANG (CATHETERS) IMPLANT
CATH THORACIC 36FR (CATHETERS) IMPLANT
CATH THORACIC 36FR RT ANG (CATHETERS) IMPLANT
CLIP VESOCCLUDE MED 6/CT (CLIP) IMPLANT
CNTNR URN SCR LID CUP LEK RST (MISCELLANEOUS) ×9 IMPLANT
CONN ST 1/4X3/8  BEN (MISCELLANEOUS)
CONN ST 1/4X3/8 BEN (MISCELLANEOUS) IMPLANT
CONN Y 3/8X3/8X3/8  BEN (MISCELLANEOUS)
CONN Y 3/8X3/8X3/8 BEN (MISCELLANEOUS) IMPLANT
CONT SPEC 4OZ STRL OR WHT (MISCELLANEOUS) ×9
DEFOGGER SCOPE WARMER CLEARIFY (MISCELLANEOUS) ×2 IMPLANT
DERMABOND ADVANCED (GAUZE/BANDAGES/DRESSINGS) ×1
DERMABOND ADVANCED .7 DNX12 (GAUZE/BANDAGES/DRESSINGS) ×1 IMPLANT
DRAIN CHANNEL 28F RND 3/8 FF (WOUND CARE) ×2 IMPLANT
DRAIN CHANNEL 32F RND 10.7 FF (WOUND CARE) IMPLANT
DRAPE ARM DVNC X/XI (DISPOSABLE) ×4 IMPLANT
DRAPE COLUMN DVNC XI (DISPOSABLE) ×1 IMPLANT
DRAPE CV SPLIT W-CLR ANES SCRN (DRAPES) ×2 IMPLANT
DRAPE DA VINCI XI ARM (DISPOSABLE) ×4
DRAPE DA VINCI XI COLUMN (DISPOSABLE) ×1
DRAPE HALF SHEET 40X57 (DRAPES) ×2 IMPLANT
DRAPE INCISE IOBAN 66X45 STRL (DRAPES) IMPLANT
DRAPE ORTHO SPLIT 77X108 STRL (DRAPES) ×1
DRAPE SURG ORHT 6 SPLT 77X108 (DRAPES) ×1 IMPLANT
ELECT BLADE 6.5 EXT (BLADE) ×2 IMPLANT
ELECT REM PT RETURN 9FT ADLT (ELECTROSURGICAL) ×2
ELECTRODE REM PT RTRN 9FT ADLT (ELECTROSURGICAL) ×1 IMPLANT
GAUZE KITTNER 4X5 RF (MISCELLANEOUS) ×6 IMPLANT
GAUZE SPONGE 4X4 12PLY STRL (GAUZE/BANDAGES/DRESSINGS) ×2 IMPLANT
GLOVE SRG 8 PF TXTR STRL LF DI (GLOVE) ×1 IMPLANT
GLOVE SURG POLYISO LF SZ8 (GLOVE) ×6 IMPLANT
GLOVE SURG SYN 7.5  E (GLOVE) ×1
GLOVE SURG SYN 7.5 E (GLOVE) ×1 IMPLANT
GLOVE SURG UNDER POLY LF SZ6.5 (GLOVE) ×2 IMPLANT
GLOVE SURG UNDER POLY LF SZ8 (GLOVE) ×1
GLOVE TRIUMPH SURG SIZE 7.5 (KITS) IMPLANT
GOWN STRL REUS W/ TWL LRG LVL3 (GOWN DISPOSABLE) ×2 IMPLANT
GOWN STRL REUS W/ TWL XL LVL3 (GOWN DISPOSABLE) ×2 IMPLANT
GOWN STRL REUS W/TWL 2XL LVL3 (GOWN DISPOSABLE) ×2 IMPLANT
GOWN STRL REUS W/TWL LRG LVL3 (GOWN DISPOSABLE) ×2
GOWN STRL REUS W/TWL XL LVL3 (GOWN DISPOSABLE) ×2
HEMOSTAT SURGICEL 2X14 (HEMOSTASIS) ×6 IMPLANT
IRRIGATION STRYKERFLOW (MISCELLANEOUS) ×1 IMPLANT
IRRIGATOR STRYKERFLOW (MISCELLANEOUS) ×2
KIT BASIN OR (CUSTOM PROCEDURE TRAY) ×2 IMPLANT
KIT SUCTION CATH 14FR (SUCTIONS) IMPLANT
KIT TURNOVER KIT B (KITS) ×2 IMPLANT
LOOP VESSEL SUPERMAXI WHITE (MISCELLANEOUS) IMPLANT
NEEDLE HYPO 25GX1X1/2 BEV (NEEDLE) ×2 IMPLANT
NEEDLE SPNL 22GX3.5 QUINCKE BK (NEEDLE) ×2 IMPLANT
NS IRRIG 1000ML POUR BTL (IV SOLUTION) ×4 IMPLANT
PACK CHEST (CUSTOM PROCEDURE TRAY) ×2 IMPLANT
PAD ARMBOARD 7.5X6 YLW CONV (MISCELLANEOUS) ×4 IMPLANT
PORT ACCESS TROCAR AIRSEAL 12 (TROCAR) ×1 IMPLANT
PORT ACCESS TROCAR AIRSEAL 5M (TROCAR) ×1
RELOAD STAPLER 2.5X45 WHT DVNC (STAPLE) ×8 IMPLANT
RELOAD STAPLER 3.5X45 BLU DVNC (STAPLE) ×3 IMPLANT
RELOAD STAPLER 4.3X45 GRN DVNC (STAPLE) ×6 IMPLANT
RELOAD STAPLER 45 4.6 BLK DVNC (STAPLE) ×2 IMPLANT
SCISSORS LAP 5X35 DISP (ENDOMECHANICALS) IMPLANT
SEAL CANN UNIV 5-8 DVNC XI (MISCELLANEOUS) ×2 IMPLANT
SEAL XI 5MM-8MM UNIVERSAL (MISCELLANEOUS) ×2
SEALANT PROGEL (MISCELLANEOUS) IMPLANT
SEALANT SURG COSEAL 4ML (VASCULAR PRODUCTS) IMPLANT
SEALANT SURG COSEAL 8ML (VASCULAR PRODUCTS) IMPLANT
SET TRI-LUMEN FLTR TB AIRSEAL (TUBING) ×2 IMPLANT
SHEARS HARMONIC HDI 20CM (ELECTROSURGICAL) IMPLANT
SOLUTION ELECTROLUBE (MISCELLANEOUS) ×2 IMPLANT
SPONGE INTESTINAL PEANUT (DISPOSABLE) IMPLANT
SPONGE TONSIL TAPE 1 RFD (DISPOSABLE) IMPLANT
STAPLER 45 SUREFORM CVD (STAPLE) ×2
STAPLER 45 SUREFORM CVD DVNC (STAPLE) ×2 IMPLANT
STAPLER CANNULA SEAL DVNC XI (STAPLE) IMPLANT
STAPLER CANNULA SEAL XI (STAPLE)
STAPLER RELOAD 2.5X45 WHITE (STAPLE) ×8
STAPLER RELOAD 2.5X45 WHT DVNC (STAPLE) ×8
STAPLER RELOAD 3.5X45 BLU DVNC (STAPLE) ×3
STAPLER RELOAD 3.5X45 BLUE (STAPLE) ×3
STAPLER RELOAD 4.3X45 GREEN (STAPLE) ×6
STAPLER RELOAD 4.3X45 GRN DVNC (STAPLE) ×6
STAPLER RELOAD 45 4.6 BLK (STAPLE) ×2
STAPLER RELOAD 45 4.6 BLK DVNC (STAPLE) ×2
SUT PDS AB 3-0 SH 27 (SUTURE) IMPLANT
SUT PROLENE 4 0 RB 1 (SUTURE)
SUT PROLENE 4-0 RB1 .5 CRCL 36 (SUTURE) IMPLANT
SUT SILK  1 MH (SUTURE) ×1
SUT SILK 1 MH (SUTURE) ×1 IMPLANT
SUT SILK 1 TIES 10X30 (SUTURE) ×2 IMPLANT
SUT SILK 2 0 SH (SUTURE) ×2 IMPLANT
SUT SILK 2 0SH CR/8 30 (SUTURE) IMPLANT
SUT SILK 3 0SH CR/8 30 (SUTURE) IMPLANT
SUT VIC AB 1 CTX 36 (SUTURE) ×2
SUT VIC AB 1 CTX36XBRD ANBCTR (SUTURE) ×2 IMPLANT
SUT VIC AB 2-0 CTX 36 (SUTURE) ×4 IMPLANT
SUT VIC AB 3-0 MH 27 (SUTURE) IMPLANT
SUT VIC AB 3-0 X1 27 (SUTURE) ×4 IMPLANT
SUT VICRYL 0 TIES 12 18 (SUTURE) ×2 IMPLANT
SUT VICRYL 0 UR6 27IN ABS (SUTURE) ×4 IMPLANT
SUT VICRYL 2 TP 1 (SUTURE) ×2 IMPLANT
SYR 20CC LL (SYRINGE) ×4 IMPLANT
SYSTEM RETRIEVAL ANCHOR 15 (MISCELLANEOUS) ×2 IMPLANT
SYSTEM SAHARA CHEST DRAIN ATS (WOUND CARE) ×2 IMPLANT
TAPE CLOTH 4X10 WHT NS (GAUZE/BANDAGES/DRESSINGS) ×2 IMPLANT
TIP APPLICATOR SPRAY EXTEND 16 (VASCULAR PRODUCTS) IMPLANT
TOWEL GREEN STERILE (TOWEL DISPOSABLE) ×2 IMPLANT
TRAY FOLEY MTR SLVR 16FR STAT (SET/KITS/TRAYS/PACK) ×2 IMPLANT
WATER STERILE IRR 1000ML POUR (IV SOLUTION) ×2 IMPLANT

## 2021-01-17 NOTE — Transfer of Care (Signed)
Immediate Anesthesia Transfer of Care Note  Patient: [name] [name]  Procedure(s) Performed: XI ROBOTIC ASSISTED THORASCOPY-LEFT LOWER LOBECTOMY (Left: Chest)  Patient Location: PACU  Anesthesia Type:General  Level of Consciousness: awake, drowsy and patient cooperative  Airway & Oxygen Therapy: Patient Spontanous Breathing and Patient connected to face mask oxygen  Post-op Assessment: Report given to RN, Post -op Vital signs reviewed and stable and Patient moving all extremities  Post vital signs: Reviewed and stable  Last Vitals:  Vitals Value Taken Time  BP 161/80   Temp    Pulse 75   Resp 18   SpO2 100     Last Pain:  Vitals:   01/17/21 0608  TempSrc:   PainSc: 0-No pain         Complications: No notable events documented.

## 2021-01-17 NOTE — Hospital Course (Addendum)
History of Present Illness:  [name] [name] is a 65 year old woman with a history of tobacco abuse, depression, hypertension, hyperlipidemia, diabetes, peripheral arterial disease, MI in 2009, normocytic anemia, stage IIIb chronic kidney disease, and right BKA.  She recently was being evaluated for microscopic hematuria.  A CT was obtained.  She was incidentally noted to have a 1.9 x 1.2 cm left lower lobe pulmonary nodule.  She saw Dr. [name].  A PET/CT was done which showed the nodule was hypermetabolic with an SUV of 6.  There was no evidence of regional or distant metastatic disease.  She had a needle biopsy which showed adenocarcinoma consistent with lung primary.  She was evaluated by Dr. Roxan Hockey for surgical resection.  He was in agreement the patient should undergo robotic assisted lung resection.  The risks and benefits of the procedure were explained to the patient and she was agreeable to proceed.  Hospital Course:  [name] presented to [facility] on 01/17/2021.  She was taken to the operating room and underwent Robotic Assisted Left Video Assisted Thoracoscopy with Left Lower Lobectomy, Lymph Node Dissection, and Intercostal Nerve Block.  She tolerated the procedure without difficulty, she was extubated and taken to the PACU in stable condition.  Post operative CXR showed evidence of pneumothorax.  Her chest tube was placed on suction.  POD #1 morning xray showed improvement with trace apical space.  There was no evidence of air leak and she was transitioned back to water seal.  The patient was mildly hypertensive and was treated with her home medications of Toprol XL, Imdur, and Aldactone.  Initially her post operative sugars remained control with home dosing of insulin and sliding scale coverage.  However, these raised to levels > 400 and her home Jardiance and Metformin were resumed for additional coverage.  Subsequent CXR on water seal remained stable with evidence of air leak.  Her CXR  showed stable appearance of apical space.  Her chest tube was removed on 01/19/2021.  Follow up CXR showed progression of lateral pneumothorax with increase left lower lobe consolidation.  Repeat CXR in AM showed increase in left pleural effusion, with pneumothorax and elevation of left hemidiaphragm.  It was felt chest tube placement would be indicated.  CT guided chest tube placement was performed via Interventional radiology.  Follow up imaging showed resolution of pneumothorax and effusion.  She drained 200 cc of serous fluid.  Repeat morning CXR remained stable with improvement of effusion and stable apical space.  Her pigtail catheter was removed without difficulty.  Follow up CXR showed stable appearance of left apical space with a trace left pleural effusion. She was treated aggressively with flutter valve usage and Mucinex.  The patient's blood pressure increased and her home hydralazine was resumed at a reduced dose.  She lives alone with her elderly aunt, due to this PT consult was obtained they felt patient would not require discharge needs.  She is ambulating without difficulty.  Her surgical incisions are healing without evidence of infection.  She is medically stable for discharge home today

## 2021-01-17 NOTE — Anesthesia Procedure Notes (Addendum)
Procedure Name: Intubation Date/Time: 01/17/2021 7:57 AM Performed by: Lowella Dell, CRNA Pre-anesthesia Checklist: Patient identified, Emergency Drugs available, Suction available and Patient being monitored Patient Re-evaluated:Patient Re-evaluated prior to induction Oxygen Delivery Method: Circle System Utilized Preoxygenation: Pre-oxygenation with 100% oxygen Induction Type: IV induction Ventilation: Mask ventilation without difficulty Laryngoscope Size: Mac and 4 Grade View: Grade I Tube type: Oral Endobronchial tube: Left and EBT position confirmed by fiberoptic bronchoscope and 37 Fr Number of attempts: 1 Airway Equipment and Method: Stylet and Oral airway Placement Confirmation: ETT inserted through vocal cords under direct vision, positive ETCO2 and breath sounds checked- equal and bilateral Tube secured with: Tape Dental Injury: Teeth and Oropharynx as per pre-operative assessment

## 2021-01-17 NOTE — Brief Op Note (Signed)
01/17/2021  11:38 AM  PATIENT:  [name] [name]  65 y.o. female  PRE-OPERATIVE DIAGNOSIS:  Left Lower Lobe  ADENOCARCINOMA  POST-OPERATIVE DIAGNOSIS:  Left Lower Lobe  ADENOCARCINOMA  PROCEDURE:  Procedure(s):  XI ROBOTIC ASSISTED THORASCOPY -Left Lower Lobectomy -Intercostal Nerve Block -Lymph Node Dissection  SURGEON:  Surgeon(s) and Role:    Melrose Nakayama, MD - Primary  PHYSICIAN ASSISTANT: Ellwood Handler PA-C  ASSISTANTS: none   ANESTHESIA:   general  EBL:  50 mL   BLOOD ADMINISTERED:none  DRAINS:  28 [address]    LOCAL MEDICATIONS USED:  BUPIVICAINE   SPECIMEN:  Source of Specimen:  Lymph Nodes, Left Lower Lobe  DISPOSITION OF SPECIMEN:  PATHOLOGY  COUNTS:  YES  TOURNIQUET:  * No tourniquets in log *  DICTATION: .Dragon Dictation  PLAN OF CARE: Admit to inpatient   PATIENT DISPOSITION:  PACU - hemodynamically stable.   Delay start of Pharmacological VTE agent (>24hrs) due to surgical blood loss or risk of bleeding: yes

## 2021-01-17 NOTE — Interval H&P Note (Signed)
History and Physical Interval Note:  01/17/2021 7:11 AM  [name] [name]  has presented today for surgery, with the diagnosis of LLL ADENOCARCINOMA.  The various methods of treatment have been discussed with the patient and family. After consideration of risks, benefits and other options for treatment, the patient has consented to  Procedure(s): XI ROBOTIC ASSISTED THORASCOPY-LEFT LOWER LOBECTOMY (Left) as a surgical intervention.  The patient's history has been reviewed, patient examined, no change in status, stable for surgery.  I have reviewed the patient's chart and labs.  Questions were answered to the patient's satisfaction.     Melrose Nakayama

## 2021-01-17 NOTE — Op Note (Signed)
NAME: [name] [name], [name] [name] MEDICAL RECORD NO: [id number] ACCOUNT NO: [id number] DATE OF BIRTH: October 15, 1955 FACILITY: [facility] LOCATION: [facility] PHYSICIAN: Revonda Standard. Roxan Hockey, MD  Operative Report   DATE OF PROCEDURE: 01/17/2021  PREOPERATIVE DIAGNOSIS:  Adenocarcinoma, left lower lobe, clinical stage IA (T1, N0).  POSTOPERATIVE DIAGNOSIS:  Adenocarcinoma, left lower lobe, clinical stage IA (T1, N0).  PROCEDURE:   Xi robotic-assisted left lower lobectomy, Mediastinal lymph node dissection and Intercostal nerve blocks levels 3 through 10.  SURGEON:  Modesto Charon, MD  ASSISTANT:  Ellwood Handler, PA  ANESTHESIA:  General.  FINDINGS: Aberrant bronchial and arterial anatomy, incomplete fissure, prominent fissure between the lingula and left upper lobe. Bronchial margin negative for tumor.  CLINICAL NOTE:  The patient is a 65 year old woman who was being evaluated for hematuria and was found to have a left lower lobe lung nodule.  A PET/CT showed the nodule was hypermetabolic.  Biopsies showed adenocarcinoma consistent with a lung primary.   She was referred for surgical resection.  The indications, risks, benefits, and alternatives were discussed in detail with the patient.  She understood and accepted the risks and agreed to proceed.  OPERATIVE NOTE:  The patient was brought to the preoperative holding area on 01/17/2021.  Anesthesia placed a central venous catheter and arterial blood pressure monitoring line.  She was taken to the operating room and anesthetized and intubated with a  double lumen endotracheal tube.  Intravenous antibiotics were administered.  Sequential compression devices were placed on the calves for DVT prophylaxis.  A Foley catheter was placed.  She was placed in a right lateral decubitus position and the left  chest was prepped and draped in the usual sterile fashion.  Single lung ventilation of the right lung was initiated and was tolerated well throughout the  procedure.  A timeout was performed.  A solution containing 20 mL of liposomal bupivacaine, 30 mL of 0.5% bupivacaine and 50 mL of saline was prepared.  This was used for the intercostal nerve blocks and for local anesthesia at the incision sites.  An incision was  made in the eighth interspace in the midaxillary line.  Chest was bluntly entered using a hemostat.  A robotic port was placed and the scope was advanced into the chest. After confirming intrapleural placement, carbon dioxide was insufflated per  protocol.  A 12 mm port was placed in the eighth interspace anteriorly.  Intercostal nerve blocks then were performed from the third to the tenth interspace by injecting 10 mL of the bupivacaine solution into a subpleural plane at each level while  visualizing with a thoracoscope. An 8 mm port was placed posteriorly in the eighth interspace for the retraction arm and then a second 12 mm port was placed centered between the camera port and the retraction port. A 12 mm AirSeal port was placed in the  tenth interspace anteriorly.  The robot was deployed.  The camera arm was docked, targeting was performed.  The remaining arms were docked.  The robotic instruments were inserted with thoracoscopic visualization.  Dissection begun with dividing the inferior pulmonary ligament.  This was done with bipolar cautery.  Level 8 paraesophageal node was identified and then level 9 nodes were removed as well.  Dissecting more superiorly, the superior segmental branch of  the vein was noted to come off at an unusual angle.  Dissection was carried up to the bronchus and level 7 nodes were removed.  All lymph nodes that were removed were sent as separate specimens for  permanent pathology.  The pleural reflection was divided  between the hilum and the aortic arch.  The upper lobe was retracted inferiorly and a level 5 node was identified and removed.  All the lymph nodes appeared grossly benign, although there was  significant inflammatory response around many of the nodes.   The pleural reflection then was divided at the hilum anteriorly.  Inspecting the lung, there was a fissure, but this was clearly not the major fissure as the area of the lung superior to the fissure was very small, relatively speaking.  Dissecting the  hilum and identifying the inferior and superior pulmonary veins, it was clear that this fissure was between the lingula and the remainder of the upper lobe.  The lingular bronchus was identified anteriorly.  The remainder of the upper lobe bronchus was  identified posteriorly.  The dissection proceeded very slowly because of the aberrant anatomy.  The inferior pulmonary vein was encircled and was divided with the robotic stapler.  The division of the lingula from the lower lobe was initiated with  sequential firings of the robotic stapler.  Again, there was no defined fissure in that area. As that dissection proceeded, there was some bleeding from a basilar pulmonary artery branch.  This was controlled with pressure and then that branch was encircled and  divided with the robotic stapler as it was clearly a basilar segmental branch to the lower lobe.  As lymph nodes were removed and the vessels were exposed, there was a very unusual branching pattern of the lower lobe pulmonary artery branches.  The  lower lobe bronchus was dissected out.  A stapler was placed across the lower lobe bronchus and closed.  There was good aeration of the lingular segment as well as the remainder of the left upper lobe.  The stapler was fired, transecting the bronchus.   Working from posteriorly, the fissure was divided, taking care to identify and preserve the pulmonary artery.  The remainder of the basilar pulmonary artery branches were dissected out and divided.  These were divided  separately.  Once those vessels had been divided, the remainder of the lobectomy was completed by dividing the parenchyma with sequential  firings of the robotic stapler using black cartridges.  The lower lobe was brought down to the inferior portion of the chest.  The sponges that  had been placed and the vessel loop that had been used were removed. Lower lobe then was pushed back to the superior part of the chest and a 15 mm endoscopic retrieval bag was placed through the AirSeal port.  The lower lobe was placed into the bag and then brought down to the lower part of the chest.  The robotic instruments were removed.  The tenth interspace incision then was enlarged to approximately 4 cm and the specimen was removed through that  incision.  The chest was copiously irrigated with warm saline.  A test inflation to 30 cm of water revealed no leakage from the bronchial stump.  There was good aeration of both portions of the upper lobe.  The lingula was tacked to the remainder of the  upper lobe with the robotic stapler to prevent any torsion.  A 28-French Blake drain was placed through the anterior eighth interspace incision and secured with #1 silk suture.  Dual lung ventilation was resumed.  The remainder of the incisions were  closed in standard fashion.  Frozen section returned showing no tumor at the bronchial margin.  The chest tube was  placed to a Pleur-Evac on waterseal.  The patient then was placed back in the supine position.  She was extubated in the operating room and  taken to the postanesthetic care unit in good condition.  All sponge, needle and instrument counts were correct at the end of the procedure.   SHW D: 01/17/2021 7:36:10 pm T: 01/17/2021 11:19:00 pm  JOB: [id number]/ [id number]

## 2021-01-17 NOTE — Anesthesia Procedure Notes (Signed)
Central Venous Catheter Insertion Performed by: Annye Asa, MD, anesthesiologist Start/End8/06/2020 7:05 AM, 01/17/2021 7:22 AM Patient location: Pre-op. Preanesthetic checklist: patient identified, IV checked, site marked, risks and benefits discussed, surgical consent, monitors and equipment checked, pre-op evaluation, timeout performed and anesthesia consent Position: Trendelenburg Lidocaine 1% used for infiltration and patient sedated Hand hygiene performed , maximum sterile barriers used  and Seldinger technique used Catheter size: 8 Fr Total catheter length 16. Central line was placed.Double lumen Procedure performed using ultrasound guided technique. Ultrasound Notes:anatomy identified, needle tip was noted to be adjacent to the nerve/plexus identified, no ultrasound evidence of intravascular and/or intraneural injection and image(s) printed for medical record Attempts: 1 Following insertion, line sutured, dressing applied and Biopatch. Post procedure assessment: blood return through all ports, free fluid flow and no air  Post procedure complications: arterial puncture (first RIJ attempt arterial, pressure held, re-prep and drape L neck). Patient tolerated the procedure well with no immediate complications. Additional procedure comments: CVP: Timeout, sterile prep, drape, FBP R neck.  Supine position.  1% lido local, finder IJ and trocar 1st pass into carotid. Pressure held and re-prep and drape L neck. Trendelenburg position. 1% lido local, finder and trocar into IJ with US guidance.  2 lumen placed over J wire. Biopatch and sterile dressing on.  Patient tolerated well.  VSS.  Jenita Seashore, MD .

## 2021-01-17 NOTE — Discharge Instructions (Signed)

## 2021-01-17 NOTE — Anesthesia Procedure Notes (Signed)
Arterial Line Insertion Start/End8/06/2020 7:00 AM, 01/17/2021 7:05 AM Performed by: Lowella Dell, CRNA, CRNA  Patient location: Pre-op. Preanesthetic checklist: patient identified, IV checked, site marked, risks and benefits discussed, surgical consent, monitors and equipment checked, pre-op evaluation, timeout performed and anesthesia consent Lidocaine 1% used for infiltration and patient sedated Left, radial was placed Catheter size: 20 G Hand hygiene performed  and maximum sterile barriers used   Attempts: 1 Procedure performed without using ultrasound guided technique. Following insertion, dressing applied and Biopatch. Post procedure assessment: normal  Patient tolerated the procedure well with no immediate complications.

## 2021-01-18 ENCOUNTER — Inpatient Hospital Stay (HOSPITAL_COMMUNITY): Payer: Medicaid Other

## 2021-01-18 LAB — GLUCOSE, CAPILLARY
Glucose-Capillary: 132 mg/dL — ABNORMAL HIGH (ref 70–99)
Glucose-Capillary: 91 mg/dL (ref 70–99)
Glucose-Capillary: 158 mg/dL — ABNORMAL HIGH (ref 70–99)
Glucose-Capillary: 208 mg/dL — ABNORMAL HIGH (ref 70–99)
Glucose-Capillary: 163 mg/dL — ABNORMAL HIGH (ref 70–99)
Glucose-Capillary: 406 mg/dL — ABNORMAL HIGH (ref 70–99)

## 2021-01-18 LAB — CBC
HCT: 28.9 % — ABNORMAL LOW (ref 36.0–46.0)
Hemoglobin: 9 g/dL — ABNORMAL LOW (ref 12.0–15.0)
MCH: 29 pg (ref 26.0–34.0)
MCHC: 31.1 g/dL (ref 30.0–36.0)
MCV: 93.2 fL (ref 80.0–100.0)
Platelets: 288 10*3/uL (ref 150–400)
RBC: 3.1 MIL/uL — ABNORMAL LOW (ref 3.87–5.11)
RDW: 13.4 % (ref 11.5–15.5)
WBC: 9 10*3/uL (ref 4.0–10.5)
nRBC: 0 % (ref 0.0–0.2)

## 2021-01-18 LAB — BASIC METABOLIC PANEL WITH GFR
Anion gap: 8 (ref 5–15)
BUN: 35 mg/dL — ABNORMAL HIGH (ref 8–23)
CO2: 25 mmol/L (ref 22–32)
Calcium: 8.6 mg/dL — ABNORMAL LOW (ref 8.9–10.3)
Chloride: 103 mmol/L (ref 98–111)
Creatinine, Ser: 1.31 mg/dL — ABNORMAL HIGH (ref 0.44–1.00)
GFR, Estimated: 46 mL/min — ABNORMAL LOW
Glucose, Bld: 124 mg/dL — ABNORMAL HIGH (ref 70–99)
Potassium: 4.3 mmol/L (ref 3.5–5.1)
Sodium: 136 mmol/L (ref 135–145)

## 2021-01-18 MED ORDER — INSULIN ASPART 100 UNIT/ML IJ SOLN
0.0000 [IU] | Freq: Three times a day (TID) | INTRAMUSCULAR | Status: DC
  Administered 2021-01-18: 4 [IU] via SUBCUTANEOUS
  Administered 2021-01-18: 5 [IU] via SUBCUTANEOUS
  Administered 2021-01-19: 4 [IU] via SUBCUTANEOUS
  Administered 2021-01-19 – 2021-01-20 (×2): 2 [IU] via SUBCUTANEOUS
  Administered 2021-01-21: 4 [IU] via SUBCUTANEOUS
  Administered 2021-01-22 – 2021-01-23 (×3): 2 [IU] via SUBCUTANEOUS

## 2021-01-18 MED ORDER — ALBUTEROL SULFATE (2.5 MG/3ML) 0.083% IN NEBU: 2.5000 mg | INHALATION_SOLUTION | Freq: Four times a day (QID) | RESPIRATORY_TRACT | Status: DC | PRN

## 2021-01-18 NOTE — Anesthesia Postprocedure Evaluation (Signed)
Anesthesia Post Note  Patient: [name] [name]  Procedure(s) Performed: XI ROBOTIC ASSISTED THORASCOPY-LEFT LOWER LOBECTOMY (Left: Chest)     Patient location during evaluation: PACU Anesthesia Type: General Level of consciousness: awake and alert Pain management: pain level controlled Vital Signs Assessment: post-procedure vital signs reviewed and stable Respiratory status: spontaneous breathing, nonlabored ventilation, respiratory function stable and patient connected to nasal cannula oxygen Cardiovascular status: blood pressure returned to baseline and stable Postop Assessment: no apparent nausea or vomiting Anesthetic complications: no   No notable events documented.  Last Vitals:  Vitals:   01/18/21 0354 01/18/21 0733  BP:  132/82  Pulse: 80 83  Resp: 15 17  Temp:  36.7 C  SpO2: 96% 94%    Last Pain:  Vitals:   01/18/21 0351  TempSrc: Axillary  PainSc:                  [place]

## 2021-01-18 NOTE — Plan of Care (Signed)
  Problem: Education: Goal: Knowledge of General Education information will improve Description: Including pain rating scale, medication(s)/side effects and non-pharmacologic comfort measures Outcome: Progressing   Problem: Health Behavior/Discharge Planning: Goal: Ability to manage health-related needs will improve Outcome: Progressing   Problem: Clinical Measurements: Goal: Ability to maintain clinical measurements within normal limits will improve Outcome: Progressing Goal: Will remain free from infection Outcome: Progressing Goal: Diagnostic test results will improve Outcome: Progressing Goal: Respiratory complications will improve Outcome: Progressing Goal: Cardiovascular complication will be avoided Outcome: Progressing   Problem: Activity: Goal: Risk for activity intolerance will decrease Outcome: Progressing   Problem: Nutrition: Goal: Adequate nutrition will be maintained Outcome: Progressing   Problem: Coping: Goal: Level of anxiety will decrease Outcome: Progressing   Problem: Elimination: Goal: Will not experience complications related to bowel motility Outcome: Progressing Goal: Will not experience complications related to urinary retention Outcome: Progressing   Problem: Safety: Goal: Ability to remain free from injury will improve Outcome: Progressing   Problem: Skin Integrity: Goal: Risk for impaired skin integrity will decrease Outcome: Progressing   Problem: Education: Goal: Knowledge of disease or condition will improve Outcome: Progressing Goal: Knowledge of the prescribed therapeutic regimen will improve Outcome: Progressing   Problem: Activity: Goal: Risk for activity intolerance will decrease Outcome: Progressing   Problem: Cardiac: Goal: Will achieve and/or maintain hemodynamic stability Outcome: Progressing   Problem: Clinical Measurements: Goal: Postoperative complications will be avoided or minimized Outcome: Progressing    Problem: Respiratory: Goal: Respiratory status will improve Outcome: Progressing   Problem: Pain Management: Goal: Pain level will decrease Outcome: Progressing   Problem: Skin Integrity: Goal: Wound healing without signs and symptoms infection will improve Outcome: Progressing   Problem: Education: Goal: Ability to describe self-care measures that may prevent or decrease complications (Diabetes Survival Skills Education) will improve Outcome: Progressing Goal: Individualized Educational Video(s) Outcome: Progressing   Problem: Coping: Goal: Ability to adjust to condition or change in health will improve Outcome: Progressing   Problem: Fluid Volume: Goal: Ability to maintain a balanced intake and output will improve Outcome: Progressing   Problem: Health Behavior/Discharge Planning: Goal: Ability to identify and utilize available resources and services will improve Outcome: Progressing Goal: Ability to manage health-related needs will improve Outcome: Progressing   Problem: Metabolic: Goal: Ability to maintain appropriate glucose levels will improve Outcome: Progressing   Problem: Nutritional: Goal: Maintenance of adequate nutrition will improve Outcome: Progressing Goal: Progress toward achieving an optimal weight will improve Outcome: Progressing   Problem: Skin Integrity: Goal: Risk for impaired skin integrity will decrease Outcome: Progressing   Problem: Tissue Perfusion: Goal: Adequacy of tissue perfusion will improve Outcome: Progressing

## 2021-01-18 NOTE — Plan of Care (Signed)
Initiation of Care Plan  Problem: Education: Goal: Knowledge of General Education information will improve Description: Including pain rating scale, medication(s)/side effects and non-pharmacologic comfort measures Outcome: Progressing   Problem: Health Behavior/Discharge Planning: Goal: Ability to manage health-related needs will improve Outcome: Progressing   Problem: Clinical Measurements: Goal: Ability to maintain clinical measurements within normal limits will improve Outcome: Progressing Goal: Will remain free from infection Outcome: Progressing Goal: Diagnostic test results will improve Outcome: Progressing Goal: Respiratory complications will improve Outcome: Progressing Goal: Cardiovascular complication will be avoided Outcome: Progressing   Problem: Activity: Goal: Risk for activity intolerance will decrease Outcome: Progressing   Problem: Nutrition: Goal: Adequate nutrition will be maintained Outcome: Progressing   Problem: Coping: Goal: Level of anxiety will decrease Outcome: Progressing   Problem: Elimination: Goal: Will not experience complications related to bowel motility Outcome: Progressing Goal: Will not experience complications related to urinary retention Outcome: Progressing   Problem: Pain Managment: Goal: General experience of comfort will improve Outcome: Progressing   Problem: Safety: Goal: Ability to remain free from injury will improve Outcome: Progressing   Problem: Skin Integrity: Goal: Risk for impaired skin integrity will decrease Outcome: Progressing   Problem: Education: Goal: Knowledge of disease or condition will improve Outcome: Progressing Goal: Knowledge of the prescribed therapeutic regimen will improve Outcome: Progressing   Problem: Activity: Goal: Risk for activity intolerance will decrease Outcome: Progressing   Problem: Cardiac: Goal: Will achieve and/or maintain hemodynamic stability Outcome: Progressing    Problem: Clinical Measurements: Goal: Postoperative complications will be avoided or minimized Outcome: Progressing   Problem: Respiratory: Goal: Respiratory status will improve Outcome: Progressing   Problem: Pain Management: Goal: Pain level will decrease Outcome: Progressing   Problem: Skin Integrity: Goal: Wound healing without signs and symptoms infection will improve Outcome: Progressing   Problem: Education: Goal: Ability to describe self-care measures that may prevent or decrease complications (Diabetes Survival Skills Education) will improve Outcome: Progressing Goal: Individualized Educational Video(s) Outcome: Progressing   Problem: Coping: Goal: Ability to adjust to condition or change in health will improve Outcome: Progressing   Problem: Fluid Volume: Goal: Ability to maintain a balanced intake and output will improve Outcome: Progressing   Problem: Health Behavior/Discharge Planning: Goal: Ability to identify and utilize available resources and services will improve Outcome: Progressing Goal: Ability to manage health-related needs will improve Outcome: Progressing   Problem: Metabolic: Goal: Ability to maintain appropriate glucose levels will improve Outcome: Progressing   Problem: Nutritional: Goal: Maintenance of adequate nutrition will improve Outcome: Progressing Goal: Progress toward achieving an optimal weight will improve Outcome: Progressing   Problem: Skin Integrity: Goal: Risk for impaired skin integrity will decrease Outcome: Progressing   Problem: Tissue Perfusion: Goal: Adequacy of tissue perfusion will improve Outcome: Progressing

## 2021-01-18 NOTE — Progress Notes (Addendum)
      [address]       [place] [address]             [phone]      1 Day Post-Op Procedure(s) (LRB): XI ROBOTIC ASSISTED THORASCOPY-LEFT LOWER LOBECTOMY (Left)  Subjective:  Having pain, currently has Kpad along left side.  Denies N/V.  Some relief with pain medication  Objective: Vital signs in last 24 hours: Temp:  [96.4 F (35.8 C)-98.8 F (37.1 C)] 96.4 F (35.8 C) (08/02 0351) Pulse Rate:  [60-83] 80 (08/02 0354) Cardiac Rhythm: Normal sinus rhythm (08/01 2143) Resp:  [10-21] 15 (08/02 0354) BP: (98-161)/(64-116) 118/64 (08/02 0351) SpO2:  [93 %-100 %] 96 % (08/02 0354) Arterial Line BP: (99-185)/(44-83) 185/83 (08/01 2200)  Intake/Output from previous day: 08/01 0701 - 08/02 0700 In: 1957.7 [P.O.:240; I.V.:1506.8; IV Piggyback:210.8] Out: 1235 [Urine:835; Blood:50; Chest Tube:350]  General appearance: alert, cooperative, and no distress Heart: regular rate and rhythm Lungs: diminished breath sounds on lrft Abdomen: soft, non-tender; bowel sounds normal; no masses,  no organomegaly Extremities: extremities normal, atraumatic, no cyanosis or edema Wound: clean and dry  Lab Results: Recent Labs    01/18/21 0130  WBC 9.0  HGB 9.0*  HCT 28.9*  PLT 288   BMET:  Recent Labs    01/18/21 0130  NA 136  K 4.3  CL 103  CO2 25  GLUCOSE 124*  BUN 35*  CREATININE 1.31*  CALCIUM 8.6*    PT/INR: No results for input(s): LABPROT, INR in the last 72 hours. ABG    Component Value Date/Time   PHART 7.325 (L) 01/13/2021 0900   HCO3 20.8 01/13/2021 0900   ACIDBASEDEF 4.2 (H) 01/13/2021 0900   O2SAT 98.4 01/13/2021 0900   CBG (last 3)  Recent Labs    01/17/21 2125 01/18/21 0104 01/18/21 0427  GLUCAP 248* 132* 91    Assessment/Plan: S/P Procedure(s) (LRB): XI ROBOTIC ASSISTED THORASCOPY-LEFT LOWER LOBECTOMY (Left)  CV- NSR, + HTN- currently on Toprol XL, Imdur, Aldactone Pulm- CT 350 cc output, minimal air leak present, however  patient's cough effort was poor, CXR with trace apical space, + atlectasis, education on importance of IS noted Renal- creatinine improved, down to 1.30, K is at 4.3 Expected post operative blood loss anemia, Hgb 9.0 monitor DM- sugars pretty well controlled on SSIP, will continue nightly levemir, SSIP for now, will resume home agents as oral intake improves Lovenox for DVT prophylaxis Dispo- patient stable, in NSR, BP stable for now continue home antihypertensives, trace apical space tiny air leak, leave on suction for now, aggressive pulmonary toilet, creatinine improved, watch glucose levels, resume home medication as needed   LOS: 1 day    Ellwood Handler, PA-C 01/18/2021  Patient seen and examined, agree with above Will place CT to water seal CXR with minimal apical space Doing well IS for atelectasis DVT prophylaxis Ambulate  Remo Lipps C. Roxan Hockey, MD [facility] [phone]

## 2021-01-19 ENCOUNTER — Inpatient Hospital Stay (HOSPITAL_COMMUNITY): Payer: Medicaid Other

## 2021-01-19 DIAGNOSIS — C3492 Malignant neoplasm of unspecified part of left bronchus or lung: Secondary | ICD-10-CM

## 2021-01-19 LAB — GLUCOSE, CAPILLARY
Glucose-Capillary: 163 mg/dL — ABNORMAL HIGH (ref 70–99)
Glucose-Capillary: 119 mg/dL — ABNORMAL HIGH (ref 70–99)
Glucose-Capillary: 140 mg/dL — ABNORMAL HIGH (ref 70–99)
Glucose-Capillary: 140 mg/dL — ABNORMAL HIGH (ref 70–99)

## 2021-01-19 LAB — COMPREHENSIVE METABOLIC PANEL
ALT: 8 U/L (ref 0–44)
AST: 27 U/L (ref 15–41)
Albumin: 2.5 g/dL — ABNORMAL LOW (ref 3.5–5.0)
Alkaline Phosphatase: 68 U/L (ref 38–126)
Anion gap: 7 (ref 5–15)
BUN: 38 mg/dL — ABNORMAL HIGH (ref 8–23)
CO2: 24 mmol/L (ref 22–32)
Calcium: 8.3 mg/dL — ABNORMAL LOW (ref 8.9–10.3)
Chloride: 104 mmol/L (ref 98–111)
Creatinine, Ser: 1.32 mg/dL — ABNORMAL HIGH (ref 0.44–1.00)
GFR, Estimated: 46 mL/min — ABNORMAL LOW (ref >60)
Glucose, Bld: 174 mg/dL — ABNORMAL HIGH (ref 70–99)
Potassium: 4.7 mmol/L (ref 3.5–5.1)
Sodium: 135 mmol/L (ref 135–145)
Total Bilirubin: 0.4 mg/dL (ref 0.3–1.2)
Total Protein: 5 g/dL — ABNORMAL LOW (ref 6.5–8.1)

## 2021-01-19 LAB — CBC
HCT: 25.9 % — ABNORMAL LOW (ref 36.0–46.0)
Hemoglobin: 8.3 g/dL — ABNORMAL LOW (ref 12.0–15.0)
MCH: 29.3 pg (ref 26.0–34.0)
MCHC: 32 g/dL (ref 30.0–36.0)
MCV: 91.5 fL (ref 80.0–100.0)
Platelets: 280 10*3/uL (ref 150–400)
RBC: 2.83 MIL/uL — ABNORMAL LOW (ref 3.87–5.11)
RDW: 13.5 % (ref 11.5–15.5)
WBC: 6.5 10*3/uL (ref 4.0–10.5)
nRBC: 0 % (ref 0.0–0.2)

## 2021-01-19 MED ORDER — METFORMIN HCL ER 500 MG PO TB24
500.0000 mg | ORAL_TABLET | Freq: Two times a day (BID) | ORAL | Status: DC
  Administered 2021-01-19 – 2021-01-23 (×9): 500 mg via ORAL
  Filled 2021-01-19 (×10): qty 1

## 2021-01-19 MED ORDER — LACTULOSE 10 GM/15ML PO SOLN: 20.0000 g | Freq: Every day | ORAL | Status: DC | PRN

## 2021-01-19 MED ORDER — EMPAGLIFLOZIN 10 MG PO TABS
10.0000 mg | ORAL_TABLET | Freq: Every day | ORAL | Status: DC
  Administered 2021-01-19 – 2021-01-23 (×5): 10 mg via ORAL
  Filled 2021-01-19 (×5): qty 1

## 2021-01-19 NOTE — Progress Notes (Signed)
Right IJ removed at 1035. Two stitches removed. No bleeding or oozing. Guaze and transparent applied. Patient tolerated well.

## 2021-01-19 NOTE — Progress Notes (Addendum)
      [address]       ,[place] [address]             [phone]      2 Days Post-Op Procedure(s) (LRB): XI ROBOTIC ASSISTED THORASCOPY-LEFT LOWER LOBECTOMY (Left)  Subjective: Patient sitting up eating breakfast.  Overall doing okay, continues to have discomfort at chest tube site.  Got out of bed yesterday, hasn't ambulated.  No BM yet, but is passing gas.  Objective: Vital signs in last 24 hours: Temp:  [97.6 F (36.4 C)-98.5 F (36.9 C)] 98.1 F (36.7 C) (08/03 0232) Pulse Rate:  [71-84] 84 (08/03 0232) Cardiac Rhythm: Normal sinus rhythm (08/03 0726) Resp:  [14-19] 19 (08/03 0232) BP: (105-138)/(59-86) 138/86 (08/03 0232) SpO2:  [90 %-97 %] 97 % (08/03 0232)  Intake/Output from previous day: 08/02 0701 - 08/03 0700 In: 514.9 [I.V.:514.9] Out: 3140 [Urine:2950; Chest Tube:190]  General appearance: alert, cooperative, and no distress Heart: regular rate and rhythm Lungs: clear to auscultation bilaterally Abdomen: soft, non-tender; bowel sounds normal; no masses,  no organomegaly Wound: clean and dry  Lab Results: Recent Labs    01/18/21 0130 01/19/21 0030  WBC 9.0 6.5  HGB 9.0* 8.3*  HCT 28.9* 25.9*  PLT 288 280   BMET:  Recent Labs    01/18/21 0130 01/19/21 0030  NA 136 135  K 4.3 4.7  CL 103 104  CO2 25 24  GLUCOSE 124* 174*  BUN 35* 38*  CREATININE 1.31* 1.32*  CALCIUM 8.6* 8.3*    PT/INR: No results for input(s): LABPROT, INR in the last 72 hours. ABG    Component Value Date/Time   PHART 7.325 (L) 01/13/2021 0900   HCO3 20.8 01/13/2021 0900   ACIDBASEDEF 4.2 (H) 01/13/2021 0900   O2SAT 98.4 01/13/2021 0900   CBG (last 3)  Recent Labs    01/18/21 1110 01/18/21 1619 01/18/21 2128  GLUCAP 208* 163* 406*    Assessment/Plan: S/P Procedure(s) (LRB): XI ROBOTIC ASSISTED THORASCOPY-LEFT LOWER LOBECTOMY (Left)  CV- NSR, BP stable- continue Toprol, Imdur, Aldactone Pulm- no air leak present, CXR with stable apical  space, atelectasis, CT output remains low, likely d/c chest tube today Renal- creatinine remains stable, K is at 4.7 DM- sugars elevated above 400 at times yesterday, continue insulin, SSIP, will restart home Metformin, Jardiance Deconditioning- patient lives alone with 59 yo aunt, will get PT consult to see if patient is candidate for home therapies, she already has home DME D[facility]- no BM yet, continue stool softners, laxative will be ordered prn if patient wishes to use Dispo- patient stable, BP remains controlled with current regimen, likely d/c chest tube today, diabetes medications restarted for better glucose control, PT consult, continue current care   LOS: 2 days    Ellwood Handler, PA-C 01/19/2021  Patient seen and examined, agree with above Has minimal space on CXR, no air leak and drainage down- dc chest tube Needs to ambulate CBG poorly controlled- see above Path T1N2- + level 8 and 9 nodes- pathologic stage IIIA- will need adjuvant therapy  Remo Lipps C. Roxan Hockey, MD [facility] [phone]

## 2021-01-19 NOTE — Progress Notes (Signed)
Spoke with RN Janett Billow via secure chat, she reports that she is aware of the DC central line removal.

## 2021-01-19 NOTE — Discharge Summary (Addendum)
Physician Discharge Summary  Patient ID: [name] [name] MRN: [id number] DOB/AGE: February 03, 1956 65 y.o.  Admit date: 01/17/2021 Discharge date: 01/23/2021  Admission Diagnoses: Non Small cell carcinoma, clinical stage IA(T1N1)  Patient Active Problem List   Diagnosis Date Noted   Non-small cell lung cancer ([place]) 12/30/2020   Depression 12/09/2020   Hyperlipidemia 12/09/2020   Hypertension 12/09/2020   Heart failure with reduced ejection fraction ([place]) 02/16/2020   Multiple subsegmental pulmonary emboli without acute cor pulmonale ([place]) 02/15/2020   Iron deficiency anemia 12/18/2019   Rectal pain 12/18/2019   Diabetic foot ulcer ([place]) 10/03/2019   Cellulitis 08/17/2019   Cocaine use 08/17/2019   Peripheral vascular disease ([place]) 08/17/2019   Tobacco use disorder 08/17/2019   Cervical dysplasia 04/02/2019   CAD (coronary artery disease), native coronary artery 06/06/2010   Type II diabetes mellitus ([place]) 06/06/2010   Acute subendocardial infarction, initial episode of care ([place]) 06/05/2010    Discharge Diagnoses: Adenocarcinoma of lung - Pathologic stage IIIA (T1N2) Patient Active Problem List   Diagnosis Date Noted   Adenocarcinoma, lung, left ([place]) 01/19/2021   S/P Robotic Assisted Video Thoracoscopy with Left Lower Lobectomy Lung, Intercostal nerve block, lymph node dissection 01/17/2021   Non-small cell lung cancer ([place]) 12/30/2020   Depression 12/09/2020   Hyperlipidemia 12/09/2020   Hypertension 12/09/2020   Heart failure with reduced ejection fraction ([place]) 02/16/2020   Multiple subsegmental pulmonary emboli without acute cor pulmonale ([place]) 02/15/2020   Iron deficiency anemia 12/18/2019   Rectal pain 12/18/2019   Diabetic foot ulcer ([place]) 10/03/2019   Cellulitis 08/17/2019   Cocaine use 08/17/2019   Peripheral vascular disease ([place]) 08/17/2019   Tobacco use disorder 08/17/2019   Cervical dysplasia 04/02/2019   CAD (coronary artery disease), native coronary artery  06/06/2010   Type II diabetes mellitus ([place]) 06/06/2010   Acute subendocardial infarction, initial episode of care [facility]) 06/05/2010   Discharged Condition: good  History of Present Illness:  [name] [name] is a 65 year old woman with a history of tobacco abuse, depression, hypertension, hyperlipidemia, diabetes, peripheral arterial disease, MI in 2009, normocytic anemia, stage IIIb chronic kidney disease, and right BKA.  She recently was being evaluated for microscopic hematuria.  A CT was obtained.  She was incidentally noted to have a 1.9 x 1.2 cm left lower lobe pulmonary nodule.  She saw Dr. [name].  A PET/CT was done which showed the nodule was hypermetabolic with an SUV of 6.  There was no evidence of regional or distant metastatic disease.  She had a needle biopsy which showed adenocarcinoma consistent with lung primary.  She was evaluated by Dr. Roxan Hockey for surgical resection.  He was in agreement the patient should undergo robotic assisted lung resection.  The risks and benefits of the procedure were explained to the patient and she was agreeable to proceed.  Hospital Course:  Ms. Vogt presented to [facility] on 01/17/2021.  She was taken to the operating room and underwent Robotic Assisted Left Video Assisted Thoracoscopy with Left Lower Lobectomy, Lymph Node Dissection, and Intercostal Nerve Block.  She tolerated the procedure without difficulty, she was extubated and taken to the PACU in stable condition.  Post operative CXR showed evidence of pneumothorax.  Her chest tube was placed on suction.  POD #1 morning xray showed improvement with trace apical space.  There was no evidence of air leak and she was transitioned back to water seal.  The patient was mildly hypertensive and was treated with her home medications of Toprol  XL, Imdur, and Aldactone.  Initially her post operative sugars remained control with home dosing of insulin and sliding scale coverage.  However, these raised to  levels > 400 and her home Jardiance and Metformin were resumed for additional coverage.  Subsequent CXR on water seal remained stable with evidence of air leak.  Her CXR showed stable appearance of apical space.  Her chest tube was removed on 01/19/2021.  Follow up CXR showed progression of lateral pneumothorax with increase left lower lobe consolidation.  Repeat CXR in AM showed increase in left pleural effusion, with pneumothorax and elevation of left hemidiaphragm.  It was felt chest tube placement would be indicated.  CT guided chest tube placement was performed via Interventional radiology.  Follow up imaging showed resolution of pneumothorax and effusion.  She drained 200 cc of serous fluid.  Repeat morning CXR remained stable with improvement of effusion and stable apical space.  Her pigtail catheter was removed without difficulty.  Follow up CXR showed stable appearance of left apical space with a trace left pleural effusion. She was treated aggressively with flutter valve usage and Mucinex.  The patient's blood pressure increased and her home hydralazine was resumed at a reduced dose.  She lives alone with her elderly aunt, due to this,  physical therapy consult was obtained they felt patient would not require any services after discharge.  She is ambulating without difficulty.  Her surgical incisions are healing without evidence of infection.  She is medically stable for discharge home today  Significant Diagnostic Studies:  CT guided lung Biopsy There is slight growth of the mass in the posteromedial aspect of the left lower lobe since prior imaging with current transverse dimensions of approximately 1.4 x 2.1 cm. Solid tissue was obtained from the lesion. Postprocedural imaging demonstrates no pneumothorax after the procedure. There is a minimal amount of parenchymal hemorrhage along the deep margin of the lesion after biopsy. A follow-up chest x-ray will be performed during recovery.    IMPRESSION: CT-guided core biopsy performed of a left lower lobe lung mass measuring 1.4 x 2.1 cm today by CT.  CLINICAL DATA:  Pneumothorax J93.9 (ICD-10-CM)    EXAM: PORTABLE CHEST 1 VIEW   COMPARISON:  01/21/2021   FINDINGS: Similar size of a small left pneumothorax. Similar volume loss in left with elevated left hemidiaphragm. Similar left lung opacities. Right lung is clear. No right-sided pleural effusion or pneumothorax. Cardiomediastinal silhouette is similar to prior, partially obscured. Cholecystectomy clips.   IMPRESSION: 1. Similar size of a small left pneumothorax. 2. Similar volume loss on the left with left lung opacities.     Electronically Signed   By: Margaretha Sheffield MD   On: 01/22/2021 08:50  Lung Biopsy Pathology: A. LUNG MASS, LEFT LOWER LOBE; BIOPSY:  - DIAGNOSTIC OF MALIGNANCY.  - NON-SMALL CELL CARCINOMA, FAVOR ADENOCARCINOMA.  Treatments: surgery:   PROCEDURE:  Xi robotic-assisted left lower lobectomy, mediastinal lymph node dissection and intercostal nerve blocks levels 3 through 10.  PATHOLOGY: 8/1 lung resection  A. LUNG, LEFT LOWER, LOBECTOMY:  - Invasive poorly differentiated adenocarcinoma, 2.2 cm  - Tumor abuts pleura but visceral pleural surface is not involved by  carcinoma  - Lymphovascular invasion is present  - Resection margins are negative for carcinoma  - See oncology table   B. LYMPH NODE, LEVEL 8, BIOPSY:  - Lymph node, negative for carcinoma (0/1)   C. LYMPH NODE, LEVEL 8#2, BIOPSY:  - Metastatic carcinoma to a lymph node (1/1)  D. LYMPH NODE, LEVEL 9, BIOPSY:  - Metastatic carcinoma to a lymph node (1/1)   E. LYMPH NODE, LEVEL 9#2, BIOPSY:  - Metastatic carcinoma to a lymph node (1/1)   F. LYMPH NODE, LEVEL 7, BIOPSY:  - Lymph node, negative for carcinoma (0/1)   G. LYMPH NODE, LEVEL 5, BIOPSY:  - Lymph node, negative for carcinoma (0/1)   H. LYMPH NODE, LEVEL 12, BIOPSY:  - Lymph node, negative for  carcinoma (0/1)   I. LYMPH NODE, LEVEL 12#2, BIOPSY:  - Lymph node, negative for carcinoma (0/1)   J. LYMPH NODE, LEVEL 12#3, BIOPSY:  - Lymph node, negative for carcinoma (0/1)   K. LYMPH NODE, LEVEL 13, BIOPSY:  - Lymph node, negative for carcinoma (0/1)   L. LYMPH NODE, LEVEL 13#2, BIOPSY:  - Benign lung parenchyma, negative for carcinoma  - Lymph node tissue is not identified     Discharge Exam: Blood pressure 139/76, pulse 85, temperature 98.2 F (36.8 C), temperature source Oral, resp. rate 14, height '5\' 5"'  (1.651 m), weight 54.9 kg, SpO2 95 %.  General appearance: alert, cooperative, and no distress Heart: regular rate and rhythm Lungs: Breath sounds clear on the right, diminished on the left with the mild pleural rub.   Abdomen: soft, non-tender; bowel sounds normal; no masses,  no organomegaly Wound: The robotic port incisions are clean and dry.  The pigtail catheter site on the left anterior chest wall is clean ans dry dressing.  Disposition:    Allergies as of 01/23/2021       Reactions   Lisinopril Other (See Comments), Swelling   Recurrent AKI and hyperkalemia when initiation attempted.        Medication List     TAKE these medications    Accu-Chek Guide test strip Generic drug: glucose blood USE UP TP 4 TIMES A DAY AS DIRECTED   Accu-Chek Softclix Lancets lancets SMARTSIG:Topical 1-4 Times Daily   aspirin EC 81 MG tablet Take 81 mg by mouth in the morning. Swallow whole.   atorvastatin 80 MG tablet Commonly known as: LIPITOR TAKE 1 TABLET BY MOUTH EVERY DAY What changed: when to take this   blood glucose meter kit and supplies Kit Dispense based on patient and insurance preference. Use up to four times daily as directed.   carboxymethylcellulose 0.5 % Soln Commonly known as: REFRESH PLUS Place 1 drop into both eyes in the morning and at bedtime.   diphenhydrAMINE 25 MG tablet Commonly known as: BENADRYL Take 25 mg by mouth at bedtime.    DULoxetine 20 MG capsule Commonly known as: CYMBALTA TAKE 2 CAPSULES (40 MG TOTAL) BY MOUTH IN THE MORNING AND AT BEDTIME.   gabapentin 100 MG capsule Commonly known as: NEURONTIN Take 1 capsule (100 mg total) by mouth 2 (two) times daily.   guaiFENesin 600 MG 12 hr tablet Commonly known as: MUCINEX Take 1 tablet (600 mg total) by mouth 2 (two) times daily as needed.   HumaLOG KwikPen 100 UNIT/ML KwikPen Generic drug: insulin lispro Inject 8 Units into the skin 3 (three) times daily after meals.   hydrALAZINE 100 MG tablet Commonly known as: APRESOLINE TAKE 1 TABLET BY MOUTH 3 TIMES DAILY. What changed: when to take this   isosorbide dinitrate 30 MG tablet Commonly known as: ISORDIL Take 1 tablet (30 mg total) by mouth in the morning, at noon, and at bedtime.   Jardiance 10 MG Tabs tablet Generic drug: empagliflozin TAKE 1 TABLET BY MOUTH EVERY DAY What  changed:  how much to take when to take this   ketoconazole 2 % cream Commonly known as: NIZORAL Apply 1 application topically in the morning and at bedtime. Applied to feet   Lantus SoloStar 100 UNIT/ML Solostar Pen Generic drug: insulin glargine 16 Units at bedtime.   metFORMIN 500 MG 24 hr tablet Commonly known as: GLUCOPHAGE-XR TAKE 1 TABLET BY MOUTH TWICE A DAY What changed: when to take this   metoprolol succinate 50 MG 24 hr tablet Commonly known as: TOPROL-XL TAKE 1 TABLET BY MOUTH EVERY DAY What changed: when to take this   multivitamin with minerals Tabs tablet Take 1 tablet by mouth in the morning. Adults 50+   spironolactone 25 MG tablet Commonly known as: ALDACTONE TAKE 1 TABLET (25 MG TOTAL) BY MOUTH DAILY. What changed: when to take this   traMADol 50 MG tablet Commonly known as: ULTRAM Take 1 tablet (50 mg total) by mouth every 6 (six) hours as needed for up to 7 days (mild pain).        Follow-up Information     Melrose Nakayama, MD Follow up on 02/09/2021.   Specialty:  Cardiothoracic Surgery Why: Appointment is at 4:00, please get CXR at 3:30 at [facility] located on first floor of our office building Contact information: [place] [address] [phone]                 Signed: Antony Odea, PA-C  01/23/2021, 11:56 AM

## 2021-01-19 NOTE — Progress Notes (Signed)
Mobility Specialist: Progress Note   01/19/21 1630  Mobility  Activity Ambulated in hall  Level of Assistance Contact guard assist, steadying assist  Assistive Device None  Distance Ambulated (ft) 250 ft  Mobility Ambulated with assistance in hallway  Mobility Response Tolerated well  Mobility performed by Mobility specialist  Bed Position Chair  $Mobility charge 1 Mobility   Pre-Mobility: 90 HR, 142/93 BP, 93% SpO2 Post-Mobility: 102 HR, 134/78 BP, 88-90% SpO2  Pt required assistance to don prosthesis and was slightly unsteady at the beginning of ambulation, otherwise asx throughout. Pt balance progressed better with distance. Pt to the recliner after walk and has call bell at her side.   [facility] Kiah Keay Mobility Specialist Mobility Specialist Phone: [phone]

## 2021-01-20 ENCOUNTER — Inpatient Hospital Stay (HOSPITAL_COMMUNITY): Payer: Medicaid Other

## 2021-01-20 LAB — GLUCOSE, CAPILLARY
Glucose-Capillary: 97 mg/dL (ref 70–99)
Glucose-Capillary: 70 mg/dL (ref 70–99)
Glucose-Capillary: 125 mg/dL — ABNORMAL HIGH (ref 70–99)
Glucose-Capillary: 124 mg/dL — ABNORMAL HIGH (ref 70–99)
Glucose-Capillary: 85 mg/dL (ref 70–99)

## 2021-01-20 MED ORDER — HYDRALAZINE HCL 50 MG PO TABS
50.0000 mg | ORAL_TABLET | Freq: Three times a day (TID) | ORAL | Status: DC
  Administered 2021-01-20 – 2021-01-23 (×8): 50 mg via ORAL
  Filled 2021-01-20 (×9): qty 1

## 2021-01-20 MED ORDER — ENOXAPARIN SODIUM 40 MG/0.4ML IJ SOSY
40.0000 mg | PREFILLED_SYRINGE | Freq: Every day | INTRAMUSCULAR | Status: DC
  Administered 2021-01-21 – 2021-01-23 (×3): 40 mg via SUBCUTANEOUS
  Filled 2021-01-20 (×3): qty 0.4

## 2021-01-20 MED ORDER — LIDOCAINE HCL 1 % IJ SOLN
INTRAMUSCULAR | Status: AC
  Filled 2021-01-20: qty 20

## 2021-01-20 MED ORDER — FENTANYL CITRATE (PF) 100 MCG/2ML IJ SOLN
INTRAMUSCULAR | Status: AC
  Filled 2021-01-20: qty 2

## 2021-01-20 MED ORDER — FENTANYL CITRATE (PF) 100 MCG/2ML IJ SOLN
INTRAMUSCULAR | Status: AC | PRN
  Administered 2021-01-20: 50 ug via INTRAVENOUS
  Administered 2021-01-20: 25 ug via INTRAVENOUS

## 2021-01-20 MED ORDER — GUAIFENESIN ER 600 MG PO TB12
600.0000 mg | ORAL_TABLET | Freq: Two times a day (BID) | ORAL | Status: DC
  Administered 2021-01-20 – 2021-01-23 (×7): 600 mg via ORAL
  Filled 2021-01-20 (×7): qty 1

## 2021-01-20 MED ORDER — LACTULOSE 10 GM/15ML PO SOLN
20.0000 g | Freq: Once | ORAL | Status: AC
  Administered 2021-01-20: 20 g via ORAL
  Filled 2021-01-20: qty 30

## 2021-01-20 NOTE — Evaluation (Signed)
Physical Therapy Evaluation/ Discharge Patient Details Name: [name] [name] MRN: [id number] DOB: August 17, 1955 Today's Date: 01/20/2021   History of Present Illness  65 y/o female admitted 8/1 for thorascopy-left lower lobectomy 2/2 to non-small cell cancer, intercostal nerve block, and lymph node dissection. PMHx: depression, HTN, HLD, T2D, PAD, MI in 2009, normocytic anemia, stage IIIb CKD, and R BKA  Clinical Impression  Pt very pleasant and moving well. Pt able to easily manage prosthesis and on RA throughout session. Pt with SpO2 97>89% at end of gait with recovery to 92% at rest. Pt able to walk long hall distance and perform stairs without physical assist. Pt with chest pain and fatigue after activity with return to bed. Pt at baseline functional status as able to walk to mailbox daily and educated for progressive walking program with oxygen saturation monitoring. Pt does not currently require further acute therapy and recommend ambulation daily with mobility tech/nursing to monitor saturations prior to D/C. Pt aware and agreeble.     Follow Up Recommendations No PT follow up    Equipment Recommendations  None recommended by PT    Recommendations for Other Services       Precautions / Restrictions Precautions Precautions: None      Mobility  Bed Mobility Overal bed mobility: Modified Independent                  Transfers Overall transfer level: Independent                  Ambulation/Gait Ambulation/Gait assistance: Modified independent (Device/Increase time) Gait Distance (Feet): 400 Feet Assistive device: None Gait Pattern/deviations: Step-through pattern;Decreased stride length   Gait velocity interpretation: >4.37 ft/sec, indicative of normal walking speed General Gait Details: increased sway with BKA and decresed knee flexion RLE  Stairs Stairs: Yes Stairs assistance: Supervision Stair Management: Step to pattern;Forwards;One rail Right Number of  Stairs: 4 General stair comments: pt with reliance on rail but good safety and stability with use of rail  Wheelchair Mobility    Modified Rankin (Stroke Patients Only)       Balance Overall balance assessment: Mild deficits observed, not formally tested                                           Pertinent Vitals/Pain Pain Assessment: 0-10 Pain Score: 8  Pain Location: left chest after walking with no pain prior to mobility Pain Descriptors / Indicators: Aching Pain Intervention(s): Limited activity within patient's tolerance;Heat applied    Home Living Family/patient expects to be discharged to:: Private residence     Type of Home: House Home Access: Stairs to enter Entrance Stairs-Rails: Can reach both Entrance Stairs-Number of Steps: 4 Home Layout: One level Home Equipment: Bedside commode;Wheelchair - Water quality scientist - 2 wheels;Shower seat;Toilet riser      Prior Function Level of Independence: Independent               Hand Dominance   Dominant Hand: Right    Extremity/Trunk Assessment   Upper Extremity Assessment Upper Extremity Assessment: Overall WFL for tasks assessed    Lower Extremity Assessment Lower Extremity Assessment: RLE deficits/detail RLE Deficits / Details: BKA with prosthesis able to independently manage       Communication   Communication: No difficulties  Cognition Arousal/Alertness: Awake/alert Behavior During Therapy: WFL for tasks assessed/performed Overall Cognitive Status: Within Functional Limits for tasks  assessed                                        General Comments      Exercises     Assessment/Plan    PT Assessment Patent does not need any further PT services  PT Problem List         PT Treatment Interventions      PT Goals (Current goals can be found in the Care Plan section)  Acute Rehab PT Goals PT Goal Formulation: All assessment and education complete, DC  therapy    Frequency     Barriers to discharge        Co-evaluation               AM-PAC PT "6 Clicks" Mobility  Outcome Measure Help needed turning from your back to your side while in a flat bed without using bedrails?: None Help needed moving from lying on your back to sitting on the side of a flat bed without using bedrails?: None Help needed moving to and from a bed to a chair (including a wheelchair)?: None Help needed standing up from a chair using your arms (e.g., wheelchair or bedside chair)?: None Help needed to walk in hospital room?: None Help needed climbing 3-5 steps with a railing? : None 6 Click Score: 24    End of Session   Activity Tolerance: Patient tolerated treatment well Patient left: in bed;with call bell/phone within reach (pt denied OOB to chair) Nurse Communication: Mobility status PT Visit Diagnosis: Other abnormalities of gait and mobility (R26.89)    Time: 2876-8115 PT Time Calculation (min) (ACUTE ONLY): 22 min   Charges:   PT Evaluation $PT Eval Moderate Complexity: 1 Mod          [place] P, PT Acute Rehabilitation Services Pager: [phone] Office: [place] 01/20/2021, 8:36 AM

## 2021-01-20 NOTE — Procedures (Signed)
Interventional Radiology Procedure Note  Procedure: Left chest tube  Indication: Recurrent hydropneumothorax.  Findings: Please refer to procedural dictation for full description.  Complications: None  EBL: < 10 mL  Miachel Roux, MD [phone]

## 2021-01-20 NOTE — Progress Notes (Signed)
Referring Physician(s): Hendrickson,S  Supervising Physician: Mir, Biochemist, clinical  Patient Status:  [facility] - In-pt  Chief Complaint:  Left pneumothorax/pleural effusion; lung cancer   Subjective: Patient familiar to IR service from left lower lobe lung mass biopsy on 12/23/2020.  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  Currently on Lovenox.  Recent lung biopsy revealed adenocarcinoma .  She underwent robotic assisted left lower lobectomy with mediastinal lymph node dissection and intercostal nerve block on 01/17/2021.  Chest tube was left in place.  Chest tube was removed yesterday.  Follow-up chest x-ray today shows increased effusion and pneumothorax following tube removal.  Request now received from T CTS for left chest tube replacement.  Patient currently denies fever, headache, worsening dyspnea, cough, abdominal/back pain, nausea, vomiting or bleeding.  She is sore at previous chest tube insertion site.  Past Medical History:  Diagnosis Date   Anemia    Cancer ([place])    CHF (congestive heart failure) ([place])    02/16/20 HFrEF 20-25% in setting of acute DVT/PE, underlying CAD   CKD (chronic kidney disease)    Coronary artery disease    02/20/20: CTO pRCA with left-to-right collaterals, 50% mLAD, 80% OM1, medical therapy   Depression    Diabetes ([place])    Diabetes mellitus without complication ([place])    DVT (deep venous thrombosis) ([place]) 02/16/2020   RLE DVT proximal veins 02/16/20 Duplex   Hyperlipidemia    Hypertension    Myocardial infarction [facility]) 2009   Stress related per patient; NSTEMI 2012 100% RCA with left-to-right collaterals   PE (pulmonary thromboembolism) ([place]) 02/15/2020   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, RLE BKA   Peripheral artery disease ([place])    Past  Surgical History:  Procedure Laterality Date   Right lower extremity BKA Right         Allergies: Lisinopril  Medications: Prior to Admission medications   Medication Sig Start Date End Date Taking? Authorizing Provider  aspirin EC 81 MG tablet Take 81 mg by mouth in the morning. Swallow whole.   Yes [provider]  atorvastatin (LIPITOR) 80 MG tablet TAKE 1 TABLET BY MOUTH EVERY DAY Patient taking differently: Take 80 mg by mouth in the morning. 01/04/21  Yes [name], MD  carboxymethylcellulose (REFRESH PLUS) 0.5 % SOLN Place 1 drop into both eyes in the morning and at bedtime.   Yes [provider]  diphenhydrAMINE (BENADRYL) 25 MG tablet Take 25 mg by mouth at bedtime.   Yes [provider]  DULoxetine (CYMBALTA) 20 MG capsule TAKE 2 CAPSULES (40 MG TOTAL) BY MOUTH IN THE MORNING AND AT BEDTIME. 10/06/20  Yes [name], MD  gabapentin (NEURONTIN) 100 MG capsule Take 1 capsule (100 mg total) by mouth 2 (two) times daily. 12/21/20  Yes [name], MD  HUMALOG KWIKPEN 100 UNIT/ML KwikPen Inject 8 Units into the skin 3 (three) times daily after meals. 01/04/21  Yes [provider]  hydrALAZINE (APRESOLINE) 100 MG tablet TAKE 1 TABLET BY MOUTH 3 TIMES DAILY. Patient taking differently: Take 100 mg by mouth in the morning, at noon, and at bedtime. 01/04/21  Yes [name], MD  isosorbide dinitrate (ISORDIL) 30 MG tablet Take 1 tablet (30 mg total) by mouth in the morning, at noon, and at bedtime. 08/10/20  Yes [name], MD  JARDIANCE 10 MG TABS tablet TAKE 1 TABLET BY MOUTH EVERY DAY Patient taking differently: Take 10 mg by mouth in the morning. 01/04/21  Yes [name], MD  ketoconazole (NIZORAL) 2 % cream Apply 1 application topically in the morning and at bedtime. Applied to feet 08/06/20  Yes [provider]  LANTUS SOLOSTAR 100 UNIT/ML Solostar Pen 16 Units at bedtime. 08/10/20  Yes [provider]   metFORMIN (GLUCOPHAGE-XR) 500 MG 24 hr tablet TAKE 1 TABLET BY MOUTH TWICE A DAY Patient taking differently: Take 500 mg by mouth in the morning and at bedtime. 01/04/21  Yes [name], MD  metoprolol succinate (TOPROL-XL) 50 MG 24 hr tablet TAKE 1 TABLET BY MOUTH EVERY DAY Patient taking differently: Take 50 mg by mouth in the morning. 01/04/21  Yes [name], MD  Multiple Vitamin (MULTIVITAMIN WITH MINERALS) TABS tablet Take 1 tablet by mouth in the morning. Adults 50+   Yes [provider]  spironolactone (ALDACTONE) 25 MG tablet TAKE 1 TABLET (25 MG TOTAL) BY MOUTH DAILY. Patient taking differently: Take 25 mg by mouth in the morning. 11/28/20  Yes [name], MD  ACCU-CHEK GUIDE test strip USE UP TP 4 TIMES A DAY AS DIRECTED 01/04/21   [name], MD  Accu-Chek Softclix Lancets lancets SMARTSIG:Topical 1-4 Times Daily 11/22/20   [provider]  blood glucose meter kit and supplies KIT Dispense based on patient and insurance preference. Use up to four times daily as directed. 11/22/20   [name], MD     Vital Signs: BP 128/84 (BP Location: Left Arm)   Pulse 85   Temp 98.2 F (36.8 C) (Oral)   Resp 20   Ht '5\' 5"'  (1.651 m)   Wt 121 lb (54.9 kg)   SpO2 90%   BMI 20.14 kg/m   Physical Exam : Awake, alert.  Chest with clear breath sounds on right, diminished left base.  Tenderness noted at previous left chest tube site.  Dressing intact, clean and dry.  Heart with regular rate and rhythm.  Abdomen soft, bowel sounds, nontender.  Right BKA, no left lower extremity edema.  Imaging: DG Chest 1V REPEAT Same Day  Result Date: 01/19/2021 CLINICAL DATA:  Chest tube removal EXAM: CHEST - 1 VIEW SAME DAY COMPARISON:  01/19/2021 FINDINGS: Left chest tube removed. Small left lateral pneumothorax is slightly increased from earlier today. Moderate left lower lobe airspace disease with progression. Elevated left hemidiaphragm with small left effusion Right lung  remains clear. Left jugular central venous catheter removed in the interval. IMPRESSION: Progression of small left lateral pneumothorax following chest tube removed. Progressive left lower lobe consolidation. Electronically Signed   By: Franchot Gallo M.D.   On: 01/19/2021 13:40   DG CHEST PORT 1 VIEW  Result Date: 01/19/2021 CLINICAL DATA:  Status post lobectomy of lung. EXAM: PORTABLE CHEST 1 VIEW COMPARISON:  01/18/2021 FINDINGS: Left jugular central venous catheter appears stable with the tip near the superior cavoatrial junction. Left chest tube is stable and along the medial aspect of left hemithorax. Evidence for a persistent small left pneumothorax with pleural line noted along the left costophrenic angle and residual lucency at the left lung apex. Stable volume loss in the left hemithorax. Right lung remains clear. Cardiomediastinal silhouette is stable. IMPRESSION: 1. Stable position of the left chest tube. Persistent small left pneumothorax that has minimally changed. Electronically Signed   By: Markus Daft  M.D.   On: 01/19/2021 08:26   DG CHEST PORT 1 VIEW  Result Date: 01/18/2021 CLINICAL DATA:  Status post lobectomy.  Chest tube. EXAM: PORTABLE CHEST 1 VIEW COMPARISON:  01/17/2021. FINDINGS: Left IJ line and left chest tube in stable position. Small left pneumothorax again noted. Postsurgical changes left lung. No pleural effusion. Heart size stable. Mild left chest wall subcutaneous emphysema. IMPRESSION: 1. Left IJ line and left chest tube in stable position. Small left pneumothorax again noted. Mild left chest wall subcutaneous emphysema. 2.  Postsurgical changes left lung. Electronically Signed   By: Marcello Moores  Register   On: 01/18/2021 07:57   DG Chest Port 1 View  Result Date: 01/17/2021 CLINICAL DATA:  Status post left lower lobectomy EXAM: PORTABLE CHEST 1 VIEW COMPARISON:  01/13/2021 FINDINGS: Interval postsurgical changes from left lower lobectomy with associated volume loss. There is a  small left-sided pneumothorax (approximately 10-15% volume). A left-sided chest tube is in place with the distal tip positioned medially in the upper left chest. There is a left IJ approach central venous catheter with distal tip terminating over the superior cavoatrial junction. The right lung is clear. The heart size is normal. No abnormal shift of the heart or mediastinal structures. There is a small amount of subcutaneous emphysema at the base of the left neck. IMPRESSION: Small left-sided pneumothorax, status post left lower lobectomy. Left-sided chest tube is in place. These results will be called to the ordering clinician or representative by the Radiologist Assistant, and communication documented in the PACS or [employer]. Electronically Signed   By: Davina Poke D.O.   On: 01/17/2021 14:28    Labs:  CBC: Recent Labs    12/30/20 1437 01/13/21 0900 01/18/21 0130 01/19/21 0030  WBC 4.4 5.3 9.0 6.5  HGB 10.4* 10.2* 9.0* 8.3*  HCT 31.7* 33.0* 28.9* 25.9*  PLT 327 330 288 280    COAGS: Recent Labs    12/23/20 0856 01/13/21 0900  INR 1.0 0.9  APTT  --  23*    BMP: Recent Labs    08/10/20 1658 11/17/20 1149 12/30/20 1437 01/13/21 0900 01/18/21 0130 01/19/21 0030  NA 136  --  136 135 136 135  K 4.6  --  4.0 4.0 4.3 4.7  CL 101  --  107 108 103 104  CO2 17*  --  20* 21* 25 24  GLUCOSE 485*  --  147* 184* 124* 174*  BUN 39*  --  45* 50* 35* 38*  CALCIUM 9.2  --  8.8* 8.8* 8.6* 8.3*  CREATININE 1.18*   < > 1.69* 1.50* 1.31* 1.32*  GFRNONAA 50*  --  34* 39* 46* 46*  GFRAA 57*  --   --   --   --   --    < > = values in this interval not displayed.    LIVER FUNCTION TESTS: Recent Labs    08/10/20 1658 12/30/20 1437 01/13/21 0900 01/19/21 0030  BILITOT  --  0.3 0.2* 0.4  AST  --  '24 22 27  ' ALT  --  '18 18 8  ' ALKPHOS  --  84 90 68  PROT  --  7.0 6.4* 5.0*  ALBUMIN 3.2* 3.7 3.4* 2.5*    Assessment and Plan: Patient familiar to IR service from left lower  lobe lung mass biopsy on 12/23/2020.  She has a past medical history of tobacco abuse, depression, hypertension, hyperlipidemia, diabetes, peripheral arterial disease, MI in 2009, anemia, chronic kidney disease and right BKA  complicated by DVT and PE treated previously with Eliquis.  Currently on Lovenox.  Recent lung biopsy revealed adenocarcinoma .  She underwent robotic assisted left lower lobectomy with mediastinal lymph node dissection and intercostal nerve block on 01/17/2021.  Chest tube was left in place.  Chest tube was removed yesterday.  Follow-up chest x-ray today shows increased effusion and pneumothorax following tube removal.  Request now received from T CTS for left chest tube replacement.  Imaging studies were reviewed by Dr. Dwaine Gale.  Details/risks of above procedure, including but not limited to, internal bleeding, infection, injury to adjacent structures discussed with patient with her understanding and consent.  Procedure scheduled for today.   Electronically Signed: D. Rowe Robert, PA-C 01/20/2021, 9:04 AM   I spent a total of 25 minutes at the the patient's bedside AND on the patient's hospital floor or unit, greater than 50% of which was counseling/coordinating care for CT-guided left chest tube placed    Patient ID: [name] [name], female   DOB: [birth year], 65 y.o.   MRN: [id number]

## 2021-01-20 NOTE — Progress Notes (Signed)
Pt to IR

## 2021-01-20 NOTE — Progress Notes (Addendum)
      [address]       [place] [address]             [phone]      3 Days Post-Op Procedure(s) (LRB): XI ROBOTIC ASSISTED THORASCOPY-LEFT LOWER LOBECTOMY (Left)  Subjective: Sitting up in bed.  Denies pain, shortness of breath.  Has not yet moved her bowels.  Ambulated with mobility tech yesterday  Objective: Vital signs in last 24 hours: Temp:  [97.8 F (36.6 C)-98.9 F (37.2 C)] 98.2 F (36.8 C) (08/04 0415) Pulse Rate:  [75-92] 81 (08/04 0415) Cardiac Rhythm: Normal sinus rhythm (08/03 2200) Resp:  [17-18] 17 (08/04 0415) BP: (122-162)/(67-90) 131/87 (08/04 0415) SpO2:  [92 %-95 %] 94 % (08/04 0415)  Intake/Output from previous day: 08/03 0701 - 08/04 0700 In: 120 [P.O.:120] Out: 850 [Urine:850]  General appearance: alert, cooperative, and no distress Heart: regular rate and rhythm Lungs: diminshed on left Abdomen: soft, non-tender, minimal to hypoactive BS Extremities: extremities normal, atraumatic, no cyanosis or edema Wound: clean andd ry  Lab Results: Recent Labs    01/18/21 0130 01/19/21 0030  WBC 9.0 6.5  HGB 9.0* 8.3*  HCT 28.9* 25.9*  PLT 288 280   BMET:  Recent Labs    01/18/21 0130 01/19/21 0030  NA 136 135  K 4.3 4.7  CL 103 104  CO2 25 24  GLUCOSE 124* 174*  BUN 35* 38*  CREATININE 1.31* 1.32*  CALCIUM 8.6* 8.3*    PT/INR: No results for input(s): LABPROT, INR in the last 72 hours. ABG    Component Value Date/Time   PHART 7.325 (L) 01/13/2021 0900   HCO3 20.8 01/13/2021 0900   ACIDBASEDEF 4.2 (H) 01/13/2021 0900   O2SAT 98.4 01/13/2021 0900   CBG (last 3)  Recent Labs    01/19/21 1535 01/19/21 2102 01/20/21 0606  GLUCAP 140* 140* 97    Assessment/Plan: S/P Procedure(s) (LRB): XI ROBOTIC ASSISTED THORASCOPY-LEFT LOWER LOBECTOMY (Left)  CV- NSR, BP slightly increased yesterday- will continue Toprol XL, Imdur, Aldactone, and restart Hydralazine at a reduced dose Pulm- no acute issues, off oxygen,  CXR with increase in left pleural fluid, left diaphragm elevated, no definitive pneumothorax DM- sugars improved with addition of home medications, continue insulin, metformin, and jardiance GI- no BM yet, will give Lactulose today Deconditioning- PT consult pending, will await recommendations Dispo- patient stable, increasing BP will restart home hydralazine at a reduced dose, glucose control much improved, CXR with increase in left pleural fluid, diaphragm elevation, continue current care   LOS: 3 days    Ellwood Handler, PA-C 01/20/2021 Patient seen and examined, agree with above She looks great, but CXR shows increased effusion and pneumothorax post CT removal. Wil see if IR can place a pigtail catheter this morning  Remo Lipps C. Roxan Hockey, MD [facility] [phone]

## 2021-01-20 NOTE — Plan of Care (Signed)
  Problem: Clinical Measurements: Goal: Ability to maintain clinical measurements within normal limits will improve Outcome: Not Progressing Goal: Diagnostic test results will improve Outcome: Not Progressing Goal: Respiratory complications will improve Outcome: Not Progressing   Problem: Clinical Measurements: Goal: Postoperative complications will be avoided or minimized Outcome: Not Progressing   Problem: Respiratory: Goal: Respiratory status will improve Outcome: Not Progressing

## 2021-01-20 NOTE — Sedation Documentation (Signed)
Unable to do in IR, patient to CT for chest tube

## 2021-01-21 ENCOUNTER — Telehealth: Payer: Self-pay | Admitting: Family Medicine

## 2021-01-21 ENCOUNTER — Inpatient Hospital Stay (HOSPITAL_COMMUNITY): Payer: Medicaid Other

## 2021-01-21 LAB — GLUCOSE, CAPILLARY
Glucose-Capillary: 109 mg/dL — ABNORMAL HIGH (ref 70–99)
Glucose-Capillary: 181 mg/dL — ABNORMAL HIGH (ref 70–99)
Glucose-Capillary: 112 mg/dL — ABNORMAL HIGH (ref 70–99)
Glucose-Capillary: 136 mg/dL — ABNORMAL HIGH (ref 70–99)

## 2021-01-21 NOTE — Plan of Care (Signed)
  Problem: Education: Goal: Knowledge of General Education information will improve Description: Including pain rating scale, medication(s)/side effects and non-pharmacologic comfort measures Outcome: Progressing   Problem: Health Behavior/Discharge Planning: Goal: Ability to manage health-related needs will improve Outcome: Progressing   Problem: Clinical Measurements: Goal: Ability to maintain clinical measurements within normal limits will improve Outcome: Progressing Goal: Will remain free from infection Outcome: Progressing Goal: Diagnostic test results will improve Outcome: Progressing Goal: Respiratory complications will improve Outcome: Progressing Goal: Cardiovascular complication will be avoided Outcome: Progressing   Problem: Activity: Goal: Risk for activity intolerance will decrease Outcome: Progressing   Problem: Nutrition: Goal: Adequate nutrition will be maintained Outcome: Progressing   Problem: Coping: Goal: Level of anxiety will decrease Outcome: Progressing   Problem: Elimination: Goal: Will not experience complications related to bowel motility Outcome: Progressing Goal: Will not experience complications related to urinary retention Outcome: Progressing   Problem: Pain Managment: Goal: General experience of comfort will improve Outcome: Progressing   Problem: Safety: Goal: Ability to remain free from injury will improve Outcome: Progressing   Problem: Skin Integrity: Goal: Risk for impaired skin integrity will decrease Outcome: Progressing   Problem: Education: Goal: Knowledge of disease or condition will improve Outcome: Progressing Goal: Knowledge of the prescribed therapeutic regimen will improve Outcome: Progressing   Problem: Activity: Goal: Risk for activity intolerance will decrease Outcome: Progressing   Problem: Cardiac: Goal: Will achieve and/or maintain hemodynamic stability Outcome: Progressing   Problem: Clinical  Measurements: Goal: Postoperative complications will be avoided or minimized Outcome: Progressing   Problem: Respiratory: Goal: Respiratory status will improve Outcome: Progressing   Problem: Pain Management: Goal: Pain level will decrease Outcome: Progressing   Problem: Skin Integrity: Goal: Wound healing without signs and symptoms infection will improve Outcome: Progressing   Problem: Education: Goal: Ability to describe self-care measures that may prevent or decrease complications (Diabetes Survival Skills Education) will improve Outcome: Progressing Goal: Individualized Educational Video(s) Outcome: Progressing   Problem: Coping: Goal: Ability to adjust to condition or change in health will improve Outcome: Progressing   Problem: Fluid Volume: Goal: Ability to maintain a balanced intake and output will improve Outcome: Progressing   Problem: Health Behavior/Discharge Planning: Goal: Ability to identify and utilize available resources and services will improve Outcome: Progressing Goal: Ability to manage health-related needs will improve Outcome: Progressing   Problem: Health Behavior/Discharge Planning: Goal: Ability to manage health-related needs will improve Outcome: Progressing   Problem: Metabolic: Goal: Ability to maintain appropriate glucose levels will improve Outcome: Progressing   Problem: Nutritional: Goal: Maintenance of adequate nutrition will improve Outcome: Progressing Goal: Progress toward achieving an optimal weight will improve Outcome: Progressing   Problem: Skin Integrity: Goal: Risk for impaired skin integrity will decrease Outcome: Progressing   Problem: Tissue Perfusion: Goal: Adequacy of tissue perfusion will improve Outcome: Progressing

## 2021-01-21 NOTE — Telephone Encounter (Signed)
Pt called in to request assistance with receiving a physical therapy referral. Pt says that it is currently needed.    Please assist further.

## 2021-01-21 NOTE — Progress Notes (Addendum)
      [address]       [place] [address]             [phone]      4 Days Post-Op Procedure(s) (LRB): XI ROBOTIC ASSISTED THORASCOPY-LEFT LOWER LOBECTOMY (Left)  Subjective:  No new complaints.  Pain at chest tube site, relieves with medications.  She was able to move her bowels yesterday.  Objective: Vital signs in last 24 hours: Temp:  [97.6 F (36.4 C)-98.2 F (36.8 C)] 98.2 F (36.8 C) (08/05 0335) Pulse Rate:  [77-90] 81 (08/05 0335) Cardiac Rhythm: Normal sinus rhythm (08/04 1235) Resp:  [14-20] 14 (08/05 0335) BP: (90-166)/(66-94) 115/73 (08/05 0616) SpO2:  [90 %-100 %] 100 % (08/05 0335)  Intake/Output from previous day: 08/04 0701 - 08/05 0700 In: 360 [P.O.:360] Out: 329 [Drains:329]  General appearance: alert, cooperative, and no distress Heart: regular rate and rhythm Lungs: diminished breath sounds on left Abdomen: soft, non-tender; bowel sounds normal; no masses,  no organomegaly Wound: clean and dry  Lab Results: Recent Labs    01/19/21 0030  WBC 6.5  HGB 8.3*  HCT 25.9*  PLT 280   BMET:  Recent Labs    01/19/21 0030  NA 135  K 4.7  CL 104  CO2 24  GLUCOSE 174*  BUN 38*  CREATININE 1.32*  CALCIUM 8.3*    PT/INR: No results for input(s): LABPROT, INR in the last 72 hours. ABG    Component Value Date/Time   PHART 7.325 (L) 01/13/2021 0900   HCO3 20.8 01/13/2021 0900   ACIDBASEDEF 4.2 (H) 01/13/2021 0900   O2SAT 98.4 01/13/2021 0900   CBG (last 3)  Recent Labs    01/20/21 1943 01/20/21 2303 01/21/21 0606  GLUCAP 124* 85 109*    Assessment/Plan: S/P Procedure(s) (LRB): XI ROBOTIC ASSISTED THORASCOPY-LEFT LOWER LOBECTOMY (Left)  CV- NSR, + HTN, improving-continue Toprol XL, Isordil, Aldactone, and Hydralazine Pulm- CT placed yesterday, 200 cc output, no air leak present, CXR with continued atelectasis, pneumothorax on left... flutter valve was ordered yesterday, however patient doesn't have it... spoke  with nurse who will get for patient, continue Mucinex DM- sugars remain controlled, continue insulin, Metformin, Jardiance Deconditioning- PT evaluation complete, no home recommendations Dispo- patient stable, HTN improved, CT in place no air leak present, CXR with improvement of pleural effusion, pneumothorax persists, continue aggressive pulmonary toilet  LOS: 4 days   Ellwood Handler, PA-C 01/21/2021 Patient seen and examined, agree with above Reviewing CT it is apparent that "collapse" was due to mucous plugging and atelectasis, not air leak CXR this morning significantly improved although there is still some work to be done Will dc pigtail Continue pulmonary hygiene, IS, flutter  Remo Lipps C. Roxan Hockey, MD [facility] [phone]

## 2021-01-22 ENCOUNTER — Inpatient Hospital Stay (HOSPITAL_COMMUNITY): Payer: Medicaid Other

## 2021-01-22 LAB — GLUCOSE, CAPILLARY
Glucose-Capillary: 120 mg/dL — ABNORMAL HIGH (ref 70–99)
Glucose-Capillary: 100 mg/dL — ABNORMAL HIGH (ref 70–99)
Glucose-Capillary: 125 mg/dL — ABNORMAL HIGH (ref 70–99)
Glucose-Capillary: 149 mg/dL — ABNORMAL HIGH (ref 70–99)

## 2021-01-22 NOTE — Plan of Care (Signed)
  Problem: Education: Goal: Knowledge of General Education information will improve Description: Including pain rating scale, medication(s)/side effects and non-pharmacologic comfort measures Outcome: Progressing   Problem: Health Behavior/Discharge Planning: Goal: Ability to manage health-related needs will improve Outcome: Progressing   Problem: Clinical Measurements: Goal: Ability to maintain clinical measurements within normal limits will improve Outcome: Progressing Goal: Will remain free from infection Outcome: Progressing Goal: Diagnostic test results will improve Outcome: Progressing Goal: Respiratory complications will improve Outcome: Progressing Goal: Cardiovascular complication will be avoided Outcome: Progressing   Problem: Activity: Goal: Risk for activity intolerance will decrease Outcome: Progressing   Problem: Nutrition: Goal: Adequate nutrition will be maintained Outcome: Progressing   Problem: Coping: Goal: Level of anxiety will decrease Outcome: Progressing   Problem: Elimination: Goal: Will not experience complications related to bowel motility Outcome: Progressing Goal: Will not experience complications related to urinary retention Outcome: Progressing   Problem: Pain Managment: Goal: General experience of comfort will improve Outcome: Progressing   Problem: Safety: Goal: Ability to remain free from injury will improve Outcome: Progressing   Problem: Skin Integrity: Goal: Risk for impaired skin integrity will decrease Outcome: Progressing   Problem: Education: Goal: Knowledge of disease or condition will improve Outcome: Progressing Goal: Knowledge of the prescribed therapeutic regimen will improve Outcome: Progressing   Problem: Activity: Goal: Risk for activity intolerance will decrease Outcome: Progressing   Problem: Cardiac: Goal: Will achieve and/or maintain hemodynamic stability Outcome: Progressing   Problem: Clinical  Measurements: Goal: Postoperative complications will be avoided or minimized Outcome: Progressing   Problem: Respiratory: Goal: Respiratory status will improve Outcome: Progressing   Problem: Pain Management: Goal: Pain level will decrease Outcome: Progressing   Problem: Skin Integrity: Goal: Wound healing without signs and symptoms infection will improve Outcome: Progressing   Problem: Education: Goal: Ability to describe self-care measures that may prevent or decrease complications (Diabetes Survival Skills Education) will improve Outcome: Progressing Goal: Individualized Educational Video(s) Outcome: Progressing   Problem: Coping: Goal: Ability to adjust to condition or change in health will improve Outcome: Progressing   Problem: Fluid Volume: Goal: Ability to maintain a balanced intake and output will improve Outcome: Progressing   Problem: Health Behavior/Discharge Planning: Goal: Ability to identify and utilize available resources and services will improve Outcome: Progressing Goal: Ability to manage health-related needs will improve Outcome: Progressing   Problem: Metabolic: Goal: Ability to maintain appropriate glucose levels will improve Outcome: Progressing   Problem: Nutritional: Goal: Maintenance of adequate nutrition will improve Outcome: Progressing Goal: Progress toward achieving an optimal weight will improve Outcome: Progressing   Problem: Skin Integrity: Goal: Risk for impaired skin integrity will decrease Outcome: Progressing   Problem: Tissue Perfusion: Goal: Adequacy of tissue perfusion will improve Outcome: Progressing

## 2021-01-22 NOTE — Progress Notes (Addendum)
      [address]       [place] [address]             [phone]      4 Days Post-Op Procedure(s) (LRB): XI ROBOTIC ASSISTED THORASCOPY-LEFT LOWER LOBECTOMY (Left)  Subjective:  Awake and alert, just finishing breakfast.  Says she feels much better today after coughing up a lot of old bloody secretions yesterday.  Remains on room air with adequate oxygen saturation.. She reports having a bowel movement each of the last 2 days.  Objective: Vital signs in last 24 hours: Temp:  [97.6 F (36.4 C)-98.4 F (36.9 C)] 98.2 F (36.8 C) (08/06 0720) Pulse Rate:  [66-93] 80 (08/06 0720) Cardiac Rhythm: Normal sinus rhythm;Bundle branch block (08/06 0700) Resp:  [16-18] 18 (08/06 0720) BP: (90-137)/(63-88) 95/63 (08/06 0720) SpO2:  [94 %-100 %] 98 % (08/06 0720)  Intake/Output from previous day: 08/05 0701 - 08/06 0700 In: 880 [P.O.:880] Out: 1300 [Urine:1300]  General appearance: alert, cooperative, and no distress Heart: regular rate and rhythm Lungs: Breath sounds clear on the right, diminished on the left with the mild pleural rub.  The chest x-ray has not been read yet but clearly shows improved expansion of the left lung.  There is no pneumothorax.  She continues to have significant bowel gas beneath the left hemidiaphragm.   Abdomen: soft, non-tender; bowel sounds normal; no masses,  no organomegaly Wound: The robotic port incisions are clean and dry.  The pigtail catheter site on the left anterior chest wall is covered with a clean dry dressing.  Lab Results: No results for input(s): WBC, HGB, HCT, PLT in the last 72 hours.  BMET:  No results for input(s): NA, K, CL, CO2, GLUCOSE, BUN, CREATININE, CALCIUM in the last 72 hours.   PT/INR: No results for input(s): LABPROT, INR in the last 72 hours. ABG    Component Value Date/Time   PHART 7.325 (L) 01/13/2021 0900   HCO3 20.8 01/13/2021 0900   ACIDBASEDEF 4.2 (H) 01/13/2021 0900   O2SAT 98.4 01/13/2021  0900   CBG (last 3)  Recent Labs    01/21/21 1620 01/21/21 2049 01/22/21 0603  GLUCAP 112* 136* 120*     Assessment/Plan: S/P Procedure(s) (LRB): XI ROBOTIC ASSISTED THORASCOPY-LEFT LOWER LOBECTOMY (Left)  CV- NSR, + HTN well-controlled-continue Toprol XL, Isordil, Aldactone, and Hydralazine Pulm-improved.  The pigtail catheter was removed yesterday.  She was able to clear a lot of secretions with coughing yesterday and her left lung is more fully expanded on chest x-ray this morning. DM- sugars remain controlled, continue insulin, Metformin, Jardiance Deconditioning- PT evaluation complete, no home recommendations Dispo- patient stable, blood pressure and heart rate stable,  continue aggressive pulmonary toilet and tentatively plan for discharge to home tomorrow  LOS: 5 days   Antony Odea, PA-C [phone] 01/22/2021   Agree with above Doing well Continue pulm toilet  [place]

## 2021-01-23 LAB — GLUCOSE, CAPILLARY
Glucose-Capillary: 73 mg/dL (ref 70–99)
Glucose-Capillary: 126 mg/dL — ABNORMAL HIGH (ref 70–99)

## 2021-01-23 MED ORDER — GUAIFENESIN ER 600 MG PO TB12: 600.0000 mg | ORAL_TABLET | Freq: Two times a day (BID) | ORAL | Status: DC | PRN

## 2021-01-23 MED ORDER — GUAIFENESIN ER 600 MG PO TB12: 600.0000 mg | ORAL_TABLET | Freq: Two times a day (BID) | ORAL | 1 refills | Status: DC | PRN

## 2021-01-23 MED ORDER — TRAMADOL HCL 50 MG PO TABS: 50.0000 mg | ORAL_TABLET | Freq: Four times a day (QID) | ORAL | 0 refills | Status: AC | PRN

## 2021-01-23 NOTE — Progress Notes (Signed)
      [address]       [place] [address]             [phone]      4 Days Post-Op Procedure(s) (LRB): XI ROBOTIC ASSISTED THORASCOPY-LEFT LOWER LOBECTOMY (Left)  Subjective:  Awake and alert,continuing to progress. Still coughing up a few old blood clots.  She has been working more with the IS and believes this is helping her. Remains on room air with adequate oxygen saturation.Marland Kitchen She would like to return home today.   Objective: Vital signs in last 24 hours: Temp:  [97.5 F (36.4 C)-98.5 F (36.9 C)] 98.2 F (36.8 C) (08/07 1118) Pulse Rate:  [70-91] 85 (08/07 1118) Cardiac Rhythm: Normal sinus rhythm;Bundle branch block (08/07 0700) Resp:  [11-21] 14 (08/07 1118) BP: (111-147)/(63-77) 139/76 (08/07 1118) SpO2:  [95 %-100 %] 95 % (08/07 1118)  Intake/Output from previous day: 08/06 0701 - 08/07 0700 In: 640 [P.O.:640] Out: 500 [Urine:500]  General appearance: alert, cooperative, and no distress Heart: regular rate and rhythm Lungs: Breath sounds clear on the right, diminished on the left with the mild pleural rub.   Abdomen: soft, non-tender; bowel sounds normal; no masses,  no organomegaly Wound: The robotic port incisions are clean and dry.  The pigtail catheter site on the left anterior chest wall is clean ans dry dressing.  Lab Results: No results for input(s): WBC, HGB, HCT, PLT in the last 72 hours.  BMET:  No results for input(s): NA, K, CL, CO2, GLUCOSE, BUN, CREATININE, CALCIUM in the last 72 hours.   PT/INR: No results for input(s): LABPROT, INR in the last 72 hours. ABG    Component Value Date/Time   PHART 7.325 (L) 01/13/2021 0900   HCO3 20.8 01/13/2021 0900   ACIDBASEDEF 4.2 (H) 01/13/2021 0900   O2SAT 98.4 01/13/2021 0900   CBG (last 3)  Recent Labs    01/22/21 2107 01/23/21 0627 01/23/21 1052  GLUCAP 149* 73 126*     Assessment/Plan: S/P Procedure(s) (LRB): XI ROBOTIC ASSISTED THORASCOPY-LEFT LOWER LOBECTOMY  (Left)  CV- NSR, + HTN well-controlled-continue Toprol XL, Isordil, Aldactone, and Hydralazine Pulm-improved.  Good sats on RA. Still clearing some old bloody secretions. DM- sugars remain controlled, continue insulin, Metformin, Jardiance Deconditioning- PT evaluation complete, no home recommendations Dispo- patient stable, blood pressure and heart rate stable,  plan for discharge to home today. Instructions given and follow up arranged.   LOS: 6 days   Antony Odea, [place] [phone] 01/23/2021

## 2021-01-23 NOTE — Plan of Care (Signed)
  Problem: Education: Goal: Knowledge of General Education information will improve Description: Including pain rating scale, medication(s)/side effects and non-pharmacologic comfort measures Outcome: Completed/Met   Problem: Health Behavior/Discharge Planning: Goal: Ability to manage health-related needs will improve Outcome: Completed/Met   Problem: Clinical Measurements: Goal: Ability to maintain clinical measurements within normal limits will improve Outcome: Completed/Met Goal: Will remain free from infection Outcome: Completed/Met Goal: Diagnostic test results will improve Outcome: Completed/Met Goal: Respiratory complications will improve Outcome: Completed/Met Goal: Cardiovascular complication will be avoided Outcome: Completed/Met   Problem: Activity: Goal: Risk for activity intolerance will decrease Outcome: Completed/Met   Problem: Nutrition: Goal: Adequate nutrition will be maintained Outcome: Completed/Met   Problem: Coping: Goal: Level of anxiety will decrease Outcome: Completed/Met   Problem: Elimination: Goal: Will not experience complications related to bowel motility Outcome: Completed/Met Goal: Will not experience complications related to urinary retention Outcome: Completed/Met   Problem: Pain Managment: Goal: General experience of comfort will improve Outcome: Completed/Met   Problem: Safety: Goal: Ability to remain free from injury will improve Outcome: Completed/Met   Problem: Skin Integrity: Goal: Risk for impaired skin integrity will decrease Outcome: Completed/Met   Problem: Education: Goal: Knowledge of disease or condition will improve Outcome: Completed/Met Goal: Knowledge of the prescribed therapeutic regimen will improve Outcome: Completed/Met   Problem: Activity: Goal: Risk for activity intolerance will decrease Outcome: Completed/Met   Problem: Cardiac: Goal: Will achieve and/or maintain hemodynamic stability Outcome:  Completed/Met   Problem: Clinical Measurements: Goal: Postoperative complications will be avoided or minimized Outcome: Completed/Met   Problem: Respiratory: Goal: Respiratory status will improve Outcome: Completed/Met   Problem: Pain Management: Goal: Pain level will decrease Outcome: Completed/Met   Problem: Skin Integrity: Goal: Wound healing without signs and symptoms infection will improve Outcome: Completed/Met   Problem: Education: Goal: Ability to describe self-care measures that may prevent or decrease complications (Diabetes Survival Skills Education) will improve Outcome: Completed/Met Goal: Individualized Educational Video(s) Outcome: Completed/Met   Problem: Coping: Goal: Ability to adjust to condition or change in health will improve Outcome: Completed/Met   Problem: Fluid Volume: Goal: Ability to maintain a balanced intake and output will improve Outcome: Completed/Met   Problem: Health Behavior/Discharge Planning: Goal: Ability to identify and utilize available resources and services will improve Outcome: Completed/Met Goal: Ability to manage health-related needs will improve Outcome: Completed/Met   Problem: Metabolic: Goal: Ability to maintain appropriate glucose levels will improve Outcome: Completed/Met   Problem: Nutritional: Goal: Maintenance of adequate nutrition will improve Outcome: Completed/Met Goal: Progress toward achieving an optimal weight will improve Outcome: Completed/Met   Problem: Skin Integrity: Goal: Risk for impaired skin integrity will decrease Outcome: Completed/Met   Problem: Tissue Perfusion: Goal: Adequacy of tissue perfusion will improve Outcome: Completed/Met

## 2021-01-23 NOTE — Progress Notes (Signed)
Pt got discharged to home, discharge instructions provided and patient showed understanding to it, IV taken out,Telemonitor DC,pt left unit in wheelchair with all of the belongings.Pt called [facility], Taxi service to go.  Palma Holter, RN

## 2021-01-24 ENCOUNTER — Telehealth: Payer: Self-pay

## 2021-01-24 NOTE — Telephone Encounter (Signed)
Transition Care Management Follow-up Telephone Call Date of discharge and from where: 01/23/2021-[facility]  How have you been since you were released from the hospital? Patient stated she is doing fine.  Any questions or concerns? No  Items Reviewed: Did the pt receive and understand the discharge instructions provided? Yes  Medications obtained and verified? Yes  Other? No  Any new allergies since your discharge? No  Dietary orders reviewed? N/A Do you have support at home? Yes   Home Care and Equipment/Supplies: Were home health services ordered? not applicable If so, what is the name of the agency? N/A  Has the agency set up a time to come to the patient's home? not applicable Were any new equipment or medical supplies ordered?  No What is the name of the medical supply agency? N/A Were you able to get the supplies/equipment? not applicable Do you have any questions related to the use of the equipment or supplies? No  Functional Questionnaire: (I = Independent and D = Dependent) ADLs: I  Bathing/Dressing- I  Meal Prep- I  Eating- I  Maintaining continence- I  Transferring/Ambulation- I  Managing Meds- I  Follow up appointments reviewed:  PCP Hospital f/u appt confirmed? No   Specialist Hospital f/u appt confirmed? Yes  Scheduled to see Dr. Roxan Hockey on 02/09/2021 @ 4:00 pm. Are transportation arrangements needed? No  If their condition worsens, is the pt aware to call PCP or go to the Emergency Dept.? Yes Was the patient provided with contact information for the PCP's office or ED? Yes Was to pt encouraged to call back with questions or concerns? Yes

## 2021-01-25 LAB — LIPID PANEL
Cholesterol: 180 (ref 0–200)
HDL: 67 (ref 35–70)
LDL Cholesterol: 94
Triglycerides: 95 (ref 40–160)
Triglycerides: 95 (ref 40–160)

## 2021-01-25 LAB — BASIC METABOLIC PANEL
BUN: 23 — AB (ref 4–21)
Creatinine: 1.2 — AB (ref 0.5–1.1)
Potassium: 4.1 (ref 3.4–5.3)
Sodium: 141 (ref 137–147)

## 2021-01-25 LAB — CBC AND DIFFERENTIAL
Hemoglobin: 8.6 — AB (ref 12.0–16.0)
WBC: 5.6

## 2021-01-25 LAB — HEMOGLOBIN A1C: Hemoglobin A1C: 7.8

## 2021-01-27 ENCOUNTER — Other Ambulatory Visit: Payer: Self-pay | Admitting: *Deleted

## 2021-01-27 NOTE — Progress Notes (Signed)
The proposed treatment discussed in cancer conference is for discussion purpose only and is not a binding recommendation. The patient was not physically examined nor present for their treatment options. Therefore, final treatment plans cannot be decided.  ?

## 2021-01-28 ENCOUNTER — Telehealth: Payer: Self-pay | Admitting: *Deleted

## 2021-01-28 NOTE — Telephone Encounter (Signed)
-----   Message from Earlie Server, MD sent at 01/27/2021  3:15 PM EDT ----- Regarding: RE: update thoracic cancer conference Hinton Dyer,  Thank you for the update. He has a follow up appointment with me on 9/1 Jabrea Kallstrom, please help me to send foundation one on the surgical specimen.   [place]  ----- Message ----- From: Valrie Hart, RN Sent: 01/27/2021  11:07 AM EDT To: Curt Bears, MD, Earlie Server, MD Subject: update thoracic cancer conference              Hi Dr. [name], I just wanted to give you an update on Ms. Joshua from today's cancer conference. Recommendation are adjuvant systemic therapy and to send tissue for molecular and PDL 1 testing.  If you have any further questions, just let me know. Thanks Hinton Dyer

## 2021-01-28 NOTE — Telephone Encounter (Signed)
Foundation One ordered on tissue from recent surgery. Pt made aware and assisted with submitting application for financial assistance.

## 2021-01-31 LAB — SURGICAL PATHOLOGY

## 2021-02-03 ENCOUNTER — Encounter (HOSPITAL_COMMUNITY): Payer: Self-pay | Admitting: Thoracic Surgery (Cardiothoracic Vascular Surgery)

## 2021-02-04 ENCOUNTER — Encounter (HOSPITAL_COMMUNITY): Payer: Self-pay

## 2021-02-06 HISTORY — PX: OTHER SURGICAL HISTORY: SHX169

## 2021-02-08 ENCOUNTER — Other Ambulatory Visit: Payer: Self-pay | Admitting: Thoracic Surgery (Cardiothoracic Vascular Surgery)

## 2021-02-08 DIAGNOSIS — C3490 Malignant neoplasm of unspecified part of unspecified bronchus or lung: Secondary | ICD-10-CM

## 2021-02-09 ENCOUNTER — Ambulatory Visit (INDEPENDENT_AMBULATORY_CARE_PROVIDER_SITE_OTHER): Payer: Self-pay | Admitting: Thoracic Surgery (Cardiothoracic Vascular Surgery)

## 2021-02-09 ENCOUNTER — Other Ambulatory Visit: Payer: Self-pay

## 2021-02-09 ENCOUNTER — Encounter: Payer: Self-pay | Admitting: Thoracic Surgery (Cardiothoracic Vascular Surgery)

## 2021-02-09 ENCOUNTER — Ambulatory Visit
Admission: RE | Admit: 2021-02-09 | Discharge: 2021-02-09 | Disposition: A | Discharge status: Home / Self Care | Payer: Medicaid Other | Source: Ambulatory Visit | Attending: Thoracic Surgery (Cardiothoracic Vascular Surgery) | Admitting: Thoracic Surgery (Cardiothoracic Vascular Surgery)

## 2021-02-09 VITALS — BP 122/79 | HR 107 | Resp 20 | Ht 65.0 in | Wt 111.0 lb

## 2021-02-09 DIAGNOSIS — Z902 Acquired absence of lung [part of]: Secondary | ICD-10-CM | POA: Diagnosis not present

## 2021-02-09 DIAGNOSIS — C3492 Malignant neoplasm of unspecified part of left bronchus or lung: Secondary | ICD-10-CM

## 2021-02-09 DIAGNOSIS — Z09 Encounter for follow-up examination after completed treatment for conditions other than malignant neoplasm: Secondary | ICD-10-CM

## 2021-02-09 DIAGNOSIS — C3490 Malignant neoplasm of unspecified part of unspecified bronchus or lung: Secondary | ICD-10-CM

## 2021-02-09 MED ORDER — OXYCODONE HCL 5 MG PO TABS: 5.0000 mg | ORAL_TABLET | Freq: Three times a day (TID) | ORAL | 0 refills | Status: AC | PRN

## 2021-02-09 NOTE — Progress Notes (Signed)
[address]       [place] [address]             [phone]      HPI: Ms. Housh returns for a scheduled follow-up visit after left lower lobectomy.  Moncerrat Burnstein is a 65 year old woman with a history of tobacco abuse, hypertension, hyperlipidemia, diabetes, PAD, MI, normocytic anemia, stage IIIb chronic kidney disease, right BKA, and depression.  She had a CT for work-up of microscopic hematuria and was incidentally noted to have a left lower lobe pulmonary nodule.  PET/CT showed the nodule was hypermetabolic with SUV of 6.  There was no evidence of adenopathy.  Needle biopsy showed adenocarcinoma.  I did a robotic assisted left lower lobectomy and node dissection on 01/17/2021.  Postoperatively she had some atelectasis and dropped her lung after chest tube removal.  That cleared with pulmonary toilet and she went home on day 4.  She says she has not been taking anything for pain.  She has a prescription from Dr. Ronnald Ramp for gabapentin but said she had run out of that medication.  She did use the tramadol but said it was not very effective.  She is requesting a pain medication and feels like she needs it to help her sleep at night.  She is not having any respiratory problems.   Past Medical History:  Diagnosis Date   Anemia    Cancer ([facility])    CHF (congestive heart failure) ([facility])    02/16/20 HFrEF 20-25% in setting of acute DVT/PE, underlying CAD   CKD (chronic kidney disease)    Coronary artery disease    02/20/20: CTO pRCA with left-to-right collaterals, 50% mLAD, 80% OM1, medical therapy   Depression    Diabetes ([facility])    Diabetes mellitus without complication ([facility])    DVT (deep venous thrombosis) ([facility]) 02/16/2020   RLE DVT proximal veins 02/16/20 Duplex   Hyperlipidemia    Hypertension    Myocardial infarction [facility]) 2009   Stress related per patient; NSTEMI 2012 100% RCA with left-to-right collaterals   PE (pulmonary thromboembolism) ([facility]) 02/15/2020   multiple  Right PE in 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, RLE BKA   Peripheral artery disease ([facility])     Current Outpatient Medications  Medication Sig Dispense Refill   ACCU-CHEK GUIDE test strip USE UP TP 4 TIMES A DAY AS DIRECTED 100 strip 1   Accu-Chek Softclix Lancets lancets SMARTSIG:Topical 1-4 Times Daily     aspirin EC 81 MG tablet Take 81 mg by mouth in the morning. Swallow whole.     atorvastatin (LIPITOR) 80 MG tablet TAKE 1 TABLET BY MOUTH EVERY DAY 90 tablet 0   blood glucose meter kit and supplies KIT Dispense based on patient and insurance preference. Use up to four times daily as directed. 1 each 0   carboxymethylcellulose (REFRESH PLUS) 0.5 % SOLN Place 1 drop into both eyes in the morning and at bedtime.     diphenhydrAMINE (BENADRYL) 25 MG tablet Take 25 mg by mouth at bedtime.     DULoxetine (CYMBALTA) 20 MG capsule TAKE 2 CAPSULES (40 MG TOTAL) BY MOUTH IN THE MORNING AND AT BEDTIME. 120 capsule 2   gabapentin (NEURONTIN) 100 MG capsule Take 1 capsule (100 mg total) by mouth 2 (two) times daily. 60 capsule 5   guaiFENesin (MUCINEX) 600 MG 12 hr tablet Take 1 tablet (600 mg total) by mouth 2 (two) times daily as needed. 30 tablet 1  HUMALOG KWIKPEN 100 UNIT/ML KwikPen Inject 8 Units into the skin 3 (three) times daily after meals.     hydrALAZINE (APRESOLINE) 100 MG tablet TAKE 1 TABLET BY MOUTH 3 TIMES DAILY. 270 tablet 0   isosorbide dinitrate (ISORDIL) 30 MG tablet Take 1 tablet (30 mg total) by mouth in the morning, at noon, and at bedtime. 180 tablet 1   JARDIANCE 10 MG TABS tablet TAKE 1 TABLET BY MOUTH EVERY DAY 90 tablet 0   ketoconazole (NIZORAL) 2 % cream Apply 1 application topically in the morning and at bedtime. Applied to feet     LANTUS SOLOSTAR 100 UNIT/ML Solostar Pen 16 Units at bedtime.     metFORMIN (GLUCOPHAGE-XR) 500 MG 24 hr tablet TAKE 1 TABLET BY MOUTH TWICE A DAY 180 tablet 0   metoprolol succinate (TOPROL-XL) 50 MG 24 hr tablet TAKE 1 TABLET BY MOUTH EVERY DAY  90 tablet 0   Multiple Vitamin (MULTIVITAMIN WITH MINERALS) TABS tablet Take 1 tablet by mouth in the morning. Adults 50+     oxyCODONE (OXY IR/ROXICODONE) 5 MG immediate release tablet Take 1 tablet (5 mg total) by mouth every 8 (eight) hours as needed for up to 7 days for severe pain. 21 tablet 0   spironolactone (ALDACTONE) 25 MG tablet TAKE 1 TABLET (25 MG TOTAL) BY MOUTH DAILY. 90 tablet 0   No current facility-administered medications for this visit.    Physical Exam BP 122/79 (BP Location: Left Arm, Patient Position: Sitting)   Pulse (!) 107   Resp 20   Ht _0  (1.651 m)   Wt 111 lb (50.3 kg)   SpO2 94% Comment: RA  BMI 18.74 kg/m  65 year old woman in no acute distress Alert and oriented x3 with no focal deficits Incisions healing well Lungs absent breath sounds at left base, otherwise clear Cardiac regular and mildly tachycardic No edema left lower extremity, right BKA  Diagnostic Tests: CHEST - 2 VIEW   COMPARISON:  01/22/2021   FINDINGS: Frontal and lateral views of the chest demonstrate elevation of the left hemidiaphragm consistent with partial left pneumonectomy. No acute airspace disease or pneumothorax. Trace left pleural effusion versus pleural thickening again noted. Right chest is clear. There are no acute bony abnormalities.   IMPRESSION: 1. Trace residual left pleural effusion versus pleural thickening. 2. Resolution of the left-sided pneumothorax seen previously. 3. Elevated left hemidiaphragm.     Electronically Signed   By: Randa Ngo M.D.   On: 02/09/2021 15:26   I personally reviewed the chest x-ray images.  They show postoperative changes with some elevation of left hemidiaphragm.  Impression: [name] [name] is a 65 year old woman with a history of tobacco abuse, hypertension, hyperlipidemia, diabetes, PAD, MI, normocytic anemia, stage IIIb chronic kidney disease, right BKA, and depression.  She was found to have a lung nodule during  work-up for microscopic hematuria.  Clinically she had stage Ia (T1, N0, M0) disease.  She underwent a robotic assisted left lower lobectomy on 01/17/2021.  She did have occult nodal metastases and was upstaged with her lymph node dissection.  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  I did inform her she will need adjuvant therapy.  From a surgical standpoint she is doing well.  She does have some elevation of the left hemidiaphragm.  She has good aeration of the left upper lobe.  She is doing well from a respiratory standpoint.  She does have some incisional discomfort.  She ran out of  gabapentin.  I recommend she refill that medication.  She needs something a little stronger intermittently.  I gave her prescription for oxycodone 5 mg p.o. 3 times daily as needed for severe pain.  21 tablets.  No refills.  I did caution her about the development of tolerance and addiction with narcotics.  She plans to use it sparingly.  There are no restrictions on her activities, but she was advised to build into new activities gradually.  She has an appointment with Dr. [name] at the [facility] in [place] on 02/17/2021.  Plan: Oxycodone 5 mg p.o. 3 times daily as needed for pain, 21 tablets, no refills Follow-up with Dr. [name] as scheduled on 02/17/2021 Return in 2 months with PA and lateral chest x-ray  Melrose Nakayama, MD [facility] [phone]

## 2021-02-10 ENCOUNTER — Encounter: Payer: Self-pay | Admitting: Family Medicine

## 2021-02-10 ENCOUNTER — Ambulatory Visit: Payer: Medicaid Other | Admitting: Family Medicine

## 2021-02-10 VITALS — BP 112/68 | HR 91 | Ht 65.0 in | Wt 112.0 lb

## 2021-02-10 DIAGNOSIS — F329 Major depressive disorder, single episode, unspecified: Secondary | ICD-10-CM | POA: Diagnosis not present

## 2021-02-10 DIAGNOSIS — I10 Essential (primary) hypertension: Secondary | ICD-10-CM | POA: Diagnosis not present

## 2021-02-10 DIAGNOSIS — E7801 Familial hypercholesterolemia: Secondary | ICD-10-CM | POA: Diagnosis not present

## 2021-02-10 DIAGNOSIS — G893 Neoplasm related pain (acute) (chronic): Secondary | ICD-10-CM | POA: Diagnosis not present

## 2021-02-10 DIAGNOSIS — B353 Tinea pedis: Secondary | ICD-10-CM | POA: Insufficient documentation

## 2021-02-10 MED ORDER — DULOXETINE HCL 20 MG PO CPEP: 40.0000 mg | ORAL_CAPSULE | Freq: Two times a day (BID) | ORAL | 2 refills | Status: DC

## 2021-02-10 MED ORDER — METOPROLOL SUCCINATE ER 50 MG PO TB24: 50.0000 mg | ORAL_TABLET | Freq: Every day | ORAL | 1 refills | Status: DC

## 2021-02-10 MED ORDER — KETOCONAZOLE 2 % EX CREA: 1.0000 "application " | TOPICAL_CREAM | Freq: Two times a day (BID) | CUTANEOUS | 1 refills | Status: DC

## 2021-02-10 MED ORDER — ATORVASTATIN CALCIUM 80 MG PO TABS: 80.0000 mg | ORAL_TABLET | Freq: Every day | ORAL | 1 refills | Status: DC

## 2021-02-10 MED ORDER — HYDRALAZINE HCL 100 MG PO TABS: 100.0000 mg | ORAL_TABLET | Freq: Three times a day (TID) | ORAL | 0 refills | Status: DC

## 2021-02-10 NOTE — Progress Notes (Signed)
Date:  02/10/2021   Name:  [name] [name]   DOB:  1955/09/27   MRN:  [id number]   Chief Complaint: Hyperlipidemia and Depression  Hyperlipidemia This is a chronic problem. The current episode started more than 1 year ago. The problem is controlled. Recent lipid tests were reviewed and are normal. Exacerbating diseases include diabetes. She has no history of chronic renal disease, hypothyroidism, liver disease, obesity or nephrotic syndrome. There are no known factors aggravating her hyperlipidemia. Pertinent negatives include no chest pain, focal sensory loss, focal weakness, leg pain, myalgias or shortness of breath. Current antihyperlipidemic treatment includes statins. The current treatment provides moderate improvement of lipids. Risk factors for coronary artery disease include dyslipidemia and hypertension.  Depression        Associated symptoms include no decreased interest, no myalgias, no headaches, not sad and no suicidal ideas.  Past treatments include SNRIs - Serotonin and norepinephrine reuptake inhibitors.  Compliance with treatment is good.  Past medical history includes chronic pain.     Pertinent negatives include no hypothyroidism.  Lab Results  Component Value Date   CREATININE 1.2 (A) 01/25/2021   BUN 23 (A) 01/25/2021   NA 141 01/25/2021   K 4.1 01/25/2021   CL 104 01/19/2021   CO2 24 01/19/2021   Lab Results  Component Value Date   CHOL 180 01/25/2021   HDL 67 01/25/2021   LDLCALC 94 01/25/2021   TRIG 95 01/25/2021   TRIG 95 01/25/2021   No results found for: TSH Lab Results  Component Value Date   HGBA1C 7.8 01/25/2021   Lab Results  Component Value Date   WBC 5.6 01/25/2021   HGB 8.6 (A) 01/25/2021   HCT 25.9 (L) 01/19/2021   MCV 91.5 01/19/2021   PLT 280 01/19/2021   Lab Results  Component Value Date   ALT 8 01/19/2021   AST 27 01/19/2021   ALKPHOS 68 01/19/2021   BILITOT 0.4 01/19/2021     Review of Systems  Constitutional:  Negative for  chills and fever.  HENT:  Negative for drooling, ear discharge, ear pain and sore throat.   Respiratory:  Negative for cough, shortness of breath and wheezing.   Cardiovascular:  Negative for chest pain, palpitations and leg swelling.  Gastrointestinal:  Negative for abdominal pain, blood in stool, constipation, diarrhea and nausea.  Endocrine: Negative for polydipsia.  Genitourinary:  Negative for dysuria, frequency, hematuria and urgency.  Musculoskeletal:  Negative for back pain, myalgias and neck pain.  Skin:  Positive for rash.  Allergic/Immunologic: Negative for environmental allergies.  Neurological:  Negative for dizziness, focal weakness and headaches.  Hematological:  Does not bruise/bleed easily.  Psychiatric/Behavioral:  Positive for depression. Negative for suicidal ideas. The patient is not nervous/anxious.    Patient Active Problem List   Diagnosis Date Noted   Adenocarcinoma, lung, left ([place]) 01/19/2021   S/P Robotic Assisted Video Thoracoscopy with Left Lower Lobectomy Lung, Intercostal nerve block, lymph node dissection 01/17/2021   Non-small cell lung cancer ([place]) 12/30/2020   Depression 12/09/2020   Hyperlipidemia 12/09/2020   Hypertension 12/09/2020   Heart failure with reduced ejection fraction ([place]) 02/16/2020   Multiple subsegmental pulmonary emboli without acute cor pulmonale ([place]) 02/15/2020   Iron deficiency anemia 12/18/2019   Rectal pain 12/18/2019   Diabetic foot ulcer ([place]) 10/03/2019   Cellulitis 08/17/2019   Cocaine use 08/17/2019   Peripheral vascular disease ([place]) 08/17/2019   Tobacco use disorder 08/17/2019   Cervical dysplasia 04/02/2019  CAD (coronary artery disease), native coronary artery 06/06/2010   Type II diabetes mellitus ([place]) 06/06/2010   Acute subendocardial infarction, initial episode of care [facility]) 06/05/2010    Allergies  Allergen Reactions   Lisinopril Other (See Comments) and Swelling    Recurrent AKI and hyperkalemia when  initiation attempted.    Past Surgical History:  Procedure Laterality Date   Right lower extremity BKA Right     Social History   Tobacco Use   Smoking status: Former    Packs/day: 0.25    Types: Cigars, Cigarettes    Quit date: 01/17/2021    Years since quitting: 0.0   Smokeless tobacco: Never  Vaping Use   Vaping Use: Never used  Substance Use Topics   Alcohol use: Never   Drug use: Not Currently     Medication list has been reviewed and updated.  Current Meds  Medication Sig   ACCU-CHEK GUIDE test strip USE UP TP 4 TIMES A DAY AS DIRECTED   Accu-Chek Softclix Lancets lancets SMARTSIG:Topical 1-4 Times Daily   aspirin EC 81 MG tablet Take 81 mg by mouth in the morning. Swallow whole.   atorvastatin (LIPITOR) 80 MG tablet TAKE 1 TABLET BY MOUTH EVERY DAY   blood glucose meter kit and supplies KIT Dispense based on patient and insurance preference. Use up to four times daily as directed.   carboxymethylcellulose (REFRESH PLUS) 0.5 % SOLN Place 1 drop into both eyes in the morning and at bedtime.   diphenhydrAMINE (BENADRYL) 25 MG tablet Take 25 mg by mouth at bedtime.   DULoxetine (CYMBALTA) 20 MG capsule TAKE 2 CAPSULES (40 MG TOTAL) BY MOUTH IN THE MORNING AND AT BEDTIME.   gabapentin (NEURONTIN) 100 MG capsule Take 1 capsule (100 mg total) by mouth 2 (two) times daily.   HUMALOG KWIKPEN 100 UNIT/ML KwikPen Inject 8 Units into the skin 3 (three) times daily after meals.   hydrALAZINE (APRESOLINE) 100 MG tablet TAKE 1 TABLET BY MOUTH 3 TIMES DAILY.   isosorbide dinitrate (ISORDIL) 30 MG tablet Take 1 tablet (30 mg total) by mouth in the morning, at noon, and at bedtime.   JARDIANCE 10 MG TABS tablet TAKE 1 TABLET BY MOUTH EVERY DAY   ketoconazole (NIZORAL) 2 % cream Apply 1 application topically in the morning and at bedtime. Applied to feet   LANTUS SOLOSTAR 100 UNIT/ML Solostar Pen 16 Units at bedtime.   metFORMIN (GLUCOPHAGE-XR) 500 MG 24 hr tablet TAKE 1 TABLET BY MOUTH  TWICE A DAY   metoprolol succinate (TOPROL-XL) 50 MG 24 hr tablet TAKE 1 TABLET BY MOUTH EVERY DAY   Multiple Vitamin (MULTIVITAMIN WITH MINERALS) TABS tablet Take 1 tablet by mouth in the morning. Adults 50+   oxyCODONE (OXY IR/ROXICODONE) 5 MG immediate release tablet Take 1 tablet (5 mg total) by mouth every 8 (eight) hours as needed for up to 7 days for severe pain.   spironolactone (ALDACTONE) 25 MG tablet TAKE 1 TABLET (25 MG TOTAL) BY MOUTH DAILY.    PHQ 2/9 Scores 02/10/2021 12/21/2020 08/10/2020 07/15/2020  PHQ - 2 Score 0 0 3 1  PHQ- 9 Score 4 0 4 1    GAD 7 : Generalized Anxiety Score 02/10/2021 08/10/2020 07/15/2020  Nervous, Anxious, on Edge 0 0 0  Control/stop worrying 0 0 0  Worry too much - different things 0 1 0  Trouble relaxing 0 0 0  Restless 0 0 0  Easily annoyed or irritable 1 0 0  Afraid -  awful might happen 0 0 0  Total GAD 7 Score 1 1 0  Anxiety Difficulty Not difficult at all - -    BP Readings from Last 3 Encounters:  02/10/21 112/68  02/09/21 122/79  01/23/21 139/76    Physical Exam  Wt Readings from Last 3 Encounters:  02/10/21 112 lb (50.8 kg)  02/09/21 111 lb (50.3 kg)  01/17/21 121 lb (54.9 kg)    BP 112/68   Pulse 91   Ht '5\' 5"'  (1.651 m)   Wt 112 lb (50.8 kg)   BMI 18.64 kg/m   Assessment and Plan: 1. 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  Relatively controlled.  Stable.  Patient is prescribed duloxetine either for reactive depression during her oncology treatments.  Currently we will refill her duloxetine at her present dose of 40 mg twice a day and will recheck patient in 4 months - DULoxetine (CYMBALTA) 20 MG capsule; Take 2 capsules (40 mg total) by mouth in the morning and at bedtime.  Dispense: 120 capsule; Refill: 2  2. Familial hypercholesterolemia Chronic.  Controlled.  Stable.  Continue atorvastatin 80 mg once a day. - atorvastatin (LIPITOR) 80 MG tablet; Take 1 tablet (80 mg total) by mouth daily.  Dispense: 90 tablet; Refill:  1  3. Primary hypertension Chronic.  Controlled.  Stable.  Blood pressure 112/68.  We will continue metoprolol XL 50 and hydralazine 100 mg 3 times a day.  Patient is followed by cardiology as well and will bring these medications to their attention. - metoprolol succinate (TOPROL-XL) 50 MG 24 hr tablet; Take 1 tablet (50 mg total) by mouth daily. Take with or immediately following a meal.  Dispense: 90 tablet; Refill: 1 - hydrALAZINE (APRESOLINE) 100 MG tablet; Take 1 tablet (100 mg total) by mouth 3 (three) times daily.  Dispense: 270 tablet; Refill: 0  4. Tinea pedis of both feet Patient needs refills on her ketoconazole that she applies to her feet twice a day and we will refill it 60 g tube before her upcoming podiatry appointment and they will determine whether to continue. - ketoconazole (NIZORAL) 2 % cream; Apply 1 application topically in the morning and at bedtime. Applied to feet  Dispense: 60 g; Refill: 1  5. Reactive depression Noted there may be a component of reactive depression for which she is on the duloxetine as well and although the patient says she is not depressed now I have a feeling that is partly because of her general improvement of her medical condition as well as her duloxetine.

## 2021-02-14 ENCOUNTER — Encounter (HOSPITAL_COMMUNITY): Payer: Self-pay | Admitting: Thoracic Surgery (Cardiothoracic Vascular Surgery)

## 2021-02-17 ENCOUNTER — Other Ambulatory Visit: Payer: Self-pay

## 2021-02-17 ENCOUNTER — Encounter: Payer: Self-pay | Admitting: Oncology

## 2021-02-17 ENCOUNTER — Inpatient Hospital Stay: Payer: Medicaid Other

## 2021-02-17 ENCOUNTER — Encounter: Payer: Self-pay | Admitting: *Deleted

## 2021-02-17 ENCOUNTER — Inpatient Hospital Stay: Payer: Medicaid Other | Attending: Oncology | Admitting: Oncology

## 2021-02-17 ENCOUNTER — Other Ambulatory Visit: Payer: Self-pay | Admitting: Oncology

## 2021-02-17 ENCOUNTER — Encounter (HOSPITAL_COMMUNITY): Payer: Self-pay | Admitting: Thoracic Surgery (Cardiothoracic Vascular Surgery)

## 2021-02-17 VITALS — BP 107/76 | HR 117 | Temp 96.7°F | Resp 16 | Wt 112.1 lb

## 2021-02-17 DIAGNOSIS — Z794 Long term (current) use of insulin: Secondary | ICD-10-CM | POA: Insufficient documentation

## 2021-02-17 DIAGNOSIS — Z86718 Personal history of other venous thrombosis and embolism: Secondary | ICD-10-CM | POA: Insufficient documentation

## 2021-02-17 DIAGNOSIS — Z902 Acquired absence of lung [part of]: Secondary | ICD-10-CM | POA: Diagnosis not present

## 2021-02-17 DIAGNOSIS — C3492 Malignant neoplasm of unspecified part of left bronchus or lung: Secondary | ICD-10-CM

## 2021-02-17 DIAGNOSIS — E538 Deficiency of other specified B group vitamins: Secondary | ICD-10-CM | POA: Insufficient documentation

## 2021-02-17 DIAGNOSIS — Z89511 Acquired absence of right leg below knee: Secondary | ICD-10-CM | POA: Insufficient documentation

## 2021-02-17 DIAGNOSIS — Z87891 Personal history of nicotine dependence: Secondary | ICD-10-CM | POA: Insufficient documentation

## 2021-02-17 DIAGNOSIS — Z8249 Family history of ischemic heart disease and other diseases of the circulatory system: Secondary | ICD-10-CM | POA: Diagnosis not present

## 2021-02-17 DIAGNOSIS — D649 Anemia, unspecified: Secondary | ICD-10-CM | POA: Diagnosis not present

## 2021-02-17 DIAGNOSIS — N1832 Chronic kidney disease, stage 3b: Secondary | ICD-10-CM | POA: Insufficient documentation

## 2021-02-17 DIAGNOSIS — Z833 Family history of diabetes mellitus: Secondary | ICD-10-CM | POA: Diagnosis not present

## 2021-02-17 DIAGNOSIS — Z5111 Encounter for antineoplastic chemotherapy: Secondary | ICD-10-CM | POA: Insufficient documentation

## 2021-02-17 DIAGNOSIS — E785 Hyperlipidemia, unspecified: Secondary | ICD-10-CM | POA: Insufficient documentation

## 2021-02-17 DIAGNOSIS — Z86711 Personal history of pulmonary embolism: Secondary | ICD-10-CM | POA: Diagnosis not present

## 2021-02-17 DIAGNOSIS — Z888 Allergy status to other drugs, medicaments and biological substances status: Secondary | ICD-10-CM | POA: Insufficient documentation

## 2021-02-17 DIAGNOSIS — E162 Hypoglycemia, unspecified: Secondary | ICD-10-CM | POA: Diagnosis not present

## 2021-02-17 DIAGNOSIS — I2510 Atherosclerotic heart disease of native coronary artery without angina pectoris: Secondary | ICD-10-CM | POA: Insufficient documentation

## 2021-02-17 DIAGNOSIS — E1122 Type 2 diabetes mellitus with diabetic chronic kidney disease: Secondary | ICD-10-CM | POA: Insufficient documentation

## 2021-02-17 DIAGNOSIS — Z87440 Personal history of urinary (tract) infections: Secondary | ICD-10-CM | POA: Insufficient documentation

## 2021-02-17 DIAGNOSIS — R3129 Other microscopic hematuria: Secondary | ICD-10-CM | POA: Diagnosis not present

## 2021-02-17 DIAGNOSIS — Z7189 Other specified counseling: Secondary | ICD-10-CM

## 2021-02-17 DIAGNOSIS — I129 Hypertensive chronic kidney disease with stage 1 through stage 4 chronic kidney disease, or unspecified chronic kidney disease: Secondary | ICD-10-CM | POA: Insufficient documentation

## 2021-02-17 DIAGNOSIS — Z79899 Other long term (current) drug therapy: Secondary | ICD-10-CM | POA: Diagnosis not present

## 2021-02-17 DIAGNOSIS — C3432 Malignant neoplasm of lower lobe, left bronchus or lung: Secondary | ICD-10-CM | POA: Diagnosis present

## 2021-02-17 DIAGNOSIS — I252 Old myocardial infarction: Secondary | ICD-10-CM | POA: Diagnosis not present

## 2021-02-17 DIAGNOSIS — F32A Depression, unspecified: Secondary | ICD-10-CM | POA: Insufficient documentation

## 2021-02-17 LAB — COMPREHENSIVE METABOLIC PANEL
ALT: 17 U/L (ref 0–44)
AST: 32 U/L (ref 15–41)
Albumin: 3.5 g/dL (ref 3.5–5.0)
Alkaline Phosphatase: 136 U/L — ABNORMAL HIGH (ref 38–126)
Anion gap: 11 (ref 5–15)
BUN: 21 mg/dL (ref 8–23)
CO2: 27 mmol/L (ref 22–32)
Calcium: 8.9 mg/dL (ref 8.9–10.3)
Chloride: 102 mmol/L (ref 98–111)
Creatinine, Ser: 1.32 mg/dL — ABNORMAL HIGH (ref 0.44–1.00)
GFR, Estimated: 46 mL/min — ABNORMAL LOW (ref >60)
Glucose, Bld: 85 mg/dL (ref 70–99)
Potassium: 3.3 mmol/L — ABNORMAL LOW (ref 3.5–5.1)
Sodium: 140 mmol/L (ref 135–145)
Total Bilirubin: 0.3 mg/dL (ref 0.3–1.2)
Total Protein: 6.6 g/dL (ref 6.5–8.1)

## 2021-02-17 LAB — CBC WITH DIFFERENTIAL/PLATELET
Abs Immature Granulocytes: 0.02 10*3/uL (ref 0.00–0.07)
Basophils Absolute: 0 10*3/uL (ref 0.0–0.1)
Basophils Relative: 1 %
Eosinophils Absolute: 0.3 10*3/uL (ref 0.0–0.5)
Eosinophils Relative: 5 %
HCT: 31.3 % — ABNORMAL LOW (ref 36.0–46.0)
Hemoglobin: 10 g/dL — ABNORMAL LOW (ref 12.0–15.0)
Immature Granulocytes: 0 %
Lymphocytes Relative: 33 %
Lymphs Abs: 2.2 10*3/uL (ref 0.7–4.0)
MCH: 29 pg (ref 26.0–34.0)
MCHC: 31.9 g/dL (ref 30.0–36.0)
MCV: 90.7 fL (ref 80.0–100.0)
Monocytes Absolute: 0.7 10*3/uL (ref 0.1–1.0)
Monocytes Relative: 11 %
Neutro Abs: 3.4 10*3/uL (ref 1.7–7.7)
Neutrophils Relative %: 50 %
Platelets: 362 10*3/uL (ref 150–400)
RBC: 3.45 MIL/uL — ABNORMAL LOW (ref 3.87–5.11)
RDW: 15 % (ref 11.5–15.5)
WBC: 6.7 10*3/uL (ref 4.0–10.5)
nRBC: 0 % (ref 0.0–0.2)

## 2021-02-17 MED ORDER — ONDANSETRON HCL 8 MG PO TABS: 8.0000 mg | ORAL_TABLET | Freq: Two times a day (BID) | ORAL | 1 refills | Status: DC | PRN

## 2021-02-17 MED ORDER — PROCHLORPERAZINE MALEATE 10 MG PO TABS: 10.0000 mg | ORAL_TABLET | Freq: Four times a day (QID) | ORAL | 1 refills | Status: DC | PRN

## 2021-02-17 MED ORDER — FOLIC ACID 1 MG PO TABS: 1.0000 mg | ORAL_TABLET | Freq: Every day | ORAL | 3 refills | Status: DC

## 2021-02-17 MED ORDER — DOCUSATE SODIUM 100 MG PO CAPS: 100.0000 mg | ORAL_CAPSULE | Freq: Two times a day (BID) | ORAL | 1 refills | Status: DC

## 2021-02-17 MED ORDER — DEXAMETHASONE 4 MG PO TABS: ORAL_TABLET | ORAL | 0 refills | Status: DC

## 2021-02-17 NOTE — Progress Notes (Addendum)
Hematology/Oncology progress note [facility] [phone] [phone]   Patient Care Team: [name], MD as PCP - General (Family Medicine)  REFERRING PROVIDER: [name], MD  CHIEF COMPLAINTS/REASON FOR VISIT:  Follow up for stage IIIA lung cancer  HISTORY OF PRESENTING ILLNESS:  Patient was recently seen by urology Dr. Candiss Norse ski for evaluation of microscopic hematuria, frequent UTI. 11/17/2020, CT hematuria work-up was obtained which showed no evidence of urinary tract calculus or hydronephrosis.  Small left renal cyst. Incidental findings of 1.9 x 1.2 cm left lower lobe pulmonary nodule worrisome for malignancy. Patient was referred to establish care with oncology for further evaluation  .  Patient denies any shortness of breath, cough, hemoptysis, unintentional weight loss, fever or night sweats. She currently smokes 1 cigar/day.  She started smoking cigarettes at age of 83.  She quitted at age of 84 and restarted smoking cigar a few years ago.  Patient has a history of depression and is on Cymbalta.  Diabetes, hyperlipidemia, hypertension, peripheral artery disease Per patient she also has history of heart attack in 2011. History of right lower extremity BKA in 2021, 02/15/2020 history of pulmonary embolism involving segmental and subsegmental branches of right upper, middle, lower lobe pulmonary arteries.  She was put on on apixaban. Patient lives with her aunt.  12/07/2020, PET scan showed left lower lobe 1.3 x 1.8 cm lung nodule with SUV of 6.5.  Suspect early stage primary lung neoplasm.  No hilar or mediastinal lymphadenopathy activity.  # 12/23/2020 S/p CT guided biopsy of left lower lobe mass.  Pathology showed non-small cell carcinoma, favor adenocarcinoma.  Positive for TTF-1, negative for p40.    INTERVAL HISTORY [name] [name] is a 65 y.o. female who has above history reviewed by me today presents for follow up visit for  non small cell lung cancer Problems and complaints are listed below: 01/17/2021, patient underwent left lower lobectomy with lymph node dissection. Pathology showed invasive poorly differentiated adenocarcinoma, 2.2 cm.  Tumor abuts pleura but visceral pleural surface is not involved by carcinoma.  LVI invasion is present, resection margins are negative for carcinoma.  10 lymph nodes were harvested and 3 lymph nodes were involved with carcinoma. pT1c pN2 Patient's case was discussed at [facility] oncology tumor board.  And consensus recommendation was adjuvant chemotherapy and sent tissue for molecular/PD-L1 testing.  Foundation one testing showed PD-L1 1,  No reportable alterations [id number] [id number]  Today patient reports feeling well.  She has constipation for which she took Dulcolax last week.  She also had some pain at the site of surgery for which she takes pain medication occasionally. She felt her sugar level was low today.  She did a glucose testing using her own device and glucose level was 37.  Patient was provided soda beverage and cookie and her hypoglycemic symptoms improved.  Review of Systems  Constitutional:  Negative for appetite change, chills, fatigue and fever.  HENT:   Negative for hearing loss and voice change.   Eyes:  Negative for eye problems.  Respiratory:  Negative for chest tightness and cough.   Cardiovascular:  Negative for chest pain.  Gastrointestinal:  Negative for abdominal distention, abdominal pain and blood in stool.  Endocrine: Negative for hot flashes.  Genitourinary:  Negative for difficulty urinating and frequency.   Musculoskeletal:  Negative for arthralgias.  Skin:  Negative for itching and rash.  Neurological:  Negative for extremity weakness.  Hematological:  Negative for adenopathy.  Psychiatric/Behavioral:  Negative for confusion.    MEDICAL HISTORY:  Past Medical History:  Diagnosis Date   Anemia    Cancer ([place])    CHF (congestive heart  failure) ([place])    02/16/20 HFrEF 20-25% in setting of acute DVT/PE, underlying CAD   CKD (chronic kidney disease)    Coronary artery disease    02/20/20: CTO pRCA with left-to-right collaterals, 50% mLAD, 80% OM1, medical therapy   Depression    Diabetes ([place])    Diabetes mellitus without complication ([place])    DVT (deep venous thrombosis) ([place]) 02/16/2020   RLE DVT proximal veins 02/16/20 Duplex   Hyperlipidemia    Hypertension    Myocardial infarction [facility]) 2009   Stress related per patient; NSTEMI 2012 100% RCA with left-to-right collaterals   PE (pulmonary thromboembolism) ([place]) 02/15/2020   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, RLE BKA   Peripheral artery disease ([place])     SURGICAL HISTORY: Past Surgical History:  Procedure Laterality Date   Right lower extremity BKA Right     SOCIAL HISTORY: Social History   Socioeconomic History   Marital status: Married    Spouse name: Not on file   Number of children: Not on file   Years of education: Not on file   Highest education level: Not on file  Occupational History   Not on file  Tobacco Use   Smoking status: Former    Packs/day: 0.25    Types: Cigars, Cigarettes    Quit date: 01/17/2021    Years since quitting: 0.0   Smokeless tobacco: Never  Vaping Use   Vaping Use: Never used  Substance and Sexual Activity   Alcohol use: Never   Drug use: Not Currently   Sexual activity: Not Currently  Other Topics Concern   Not on file  Social History Narrative   Not on file   Social Determinants of Health   Financial Resource Strain: Not on file  Food Insecurity: Not on file  Transportation Needs: Not on file  Physical Activity: Not on file  Stress: Not on file  Social Connections: Not on file  Intimate Partner Violence: Not on file    FAMILY HISTORY: Family History  Problem Relation Age of Onset   Heart disease Mother    Diabetes Mother    Heart disease Father    Diabetes Father     ALLERGIES:  is  allergic to lisinopril.  MEDICATIONS:  Current Outpatient Medications  Medication Sig Dispense Refill   ACCU-CHEK GUIDE test strip USE UP TP 4 TIMES A DAY AS DIRECTED 100 strip 1   Accu-Chek Softclix Lancets lancets SMARTSIG:Topical 1-4 Times Daily     aspirin EC 81 MG tablet Take 81 mg by mouth in the morning. Swallow whole.     atorvastatin (LIPITOR) 80 MG tablet Take 1 tablet (80 mg total) by mouth daily. 90 tablet 1   blood glucose meter kit and supplies KIT Dispense based on patient and insurance preference. Use up to four times daily as directed. 1 each 0   carboxymethylcellulose (REFRESH PLUS) 0.5 % SOLN Place 1 drop into both eyes in the morning and at bedtime.     diphenhydrAMINE (BENADRYL) 25 MG tablet Take 25 mg by mouth at bedtime.     docusate sodium (COLACE) 100 MG capsule Take 1 capsule (100 mg total) by mouth 2 (two) times daily. 60 capsule 1   DULoxetine (CYMBALTA) 20 MG capsule Take 2 capsules (40 mg total) by mouth in the  morning and at bedtime. 120 capsule 2   gabapentin (NEURONTIN) 100 MG capsule Take 1 capsule (100 mg total) by mouth 2 (two) times daily. 60 capsule 5   HUMALOG KWIKPEN 100 UNIT/ML KwikPen Inject 8 Units into the skin 3 (three) times daily after meals.     hydrALAZINE (APRESOLINE) 100 MG tablet Take 1 tablet (100 mg total) by mouth 3 (three) times daily. 270 tablet 0   isosorbide dinitrate (ISORDIL) 30 MG tablet Take 1 tablet (30 mg total) by mouth in the morning, at noon, and at bedtime. 180 tablet 1   JARDIANCE 10 MG TABS tablet TAKE 1 TABLET BY MOUTH EVERY DAY 90 tablet 0   ketoconazole (NIZORAL) 2 % cream Apply 1 application topically in the morning and at bedtime. Applied to feet 60 g 1   LANTUS SOLOSTAR 100 UNIT/ML Solostar Pen 16 Units at bedtime.     metFORMIN (GLUCOPHAGE-XR) 500 MG 24 hr tablet TAKE 1 TABLET BY MOUTH TWICE A DAY 180 tablet 0   metoprolol succinate (TOPROL-XL) 50 MG 24 hr tablet Take 1 tablet (50 mg total) by mouth daily. Take with  or immediately following a meal. 90 tablet 1   Multiple Vitamin (MULTIVITAMIN WITH MINERALS) TABS tablet Take 1 tablet by mouth in the morning. Adults 50+     spironolactone (ALDACTONE) 25 MG tablet TAKE 1 TABLET (25 MG TOTAL) BY MOUTH DAILY. 90 tablet 0   No current facility-administered medications for this visit.     PHYSICAL EXAMINATION: ECOG PERFORMANCE STATUS: 1 - Symptomatic but completely ambulatory Vitals:   02/17/21 0946  BP: 107/76  Pulse: (!) 117  Resp: 16  Temp: (!) 96.7 F (35.9 C)  SpO2: 99%   Filed Weights   02/17/21 0946  Weight: 112 lb 1.7 oz (50.8 kg)    Physical Exam Constitutional:      General: She is not in acute distress. HENT:     Head: Normocephalic and atraumatic.  Eyes:     General: No scleral icterus. Cardiovascular:     Rate and Rhythm: Normal rate and regular rhythm.     Heart sounds: Normal heart sounds.  Pulmonary:     Effort: Pulmonary effort is normal. No respiratory distress.     Breath sounds: No wheezing.  Abdominal:     General: Bowel sounds are normal. There is no distension.     Palpations: Abdomen is soft.  Musculoskeletal:        General: No deformity. Normal range of motion.     Cervical back: Normal range of motion and neck supple.     Comments: Right lower extremity BKA  Skin:    General: Skin is warm and dry.     Findings: No erythema or rash.  Neurological:     Mental Status: She is alert and oriented to person, place, and time. Mental status is at baseline.     Cranial Nerves: No cranial nerve deficit.     Coordination: Coordination normal.  Psychiatric:        Mood and Affect: Mood normal.    LABORATORY DATA:  I have reviewed the data as listed Lab Results  Component Value Date   WBC 6.7 02/17/2021   HGB 10.0 (L) 02/17/2021   HCT 31.3 (L) 02/17/2021   MCV 90.7 02/17/2021   PLT 362 02/17/2021   Recent Labs    08/10/20 1658 11/17/20 1149 01/13/21 0900 01/18/21 0130 01/19/21 0030 01/25/21 0000  02/17/21 1108  NA 136   < > 135  136 135 141 140  K 4.6   < > 4.0 4.3 4.7 4.1 3.3*  CL 101   < > 108 103 104  --  102  CO2 17*   < > 21* 25 24  --  27  GLUCOSE 485*   < > 184* 124* 174*  --  85  BUN 39*   < > 50* 35* 38* 23* 21  CREATININE 1.18*   < > 1.50* 1.31* 1.32* 1.2* 1.32*  CALCIUM 9.2   < > 8.8* 8.6* 8.3*  --  8.9  GFRNONAA 50*   < > 39* 46* 46*  --  46*  GFRAA 57*  --   --   --   --   --   --   PROT  --    < > 6.4*  --  5.0*  --  6.6  ALBUMIN 3.2*   < > 3.4*  --  2.5*  --  3.5  AST  --    < > 22  --  27  --  32  ALT  --    < > 18  --  8  --  17  ALKPHOS  --    < > 90  --  68  --  136*  BILITOT  --    < > 0.2*  --  0.4  --  0.3   < > = values in this interval not displayed.    Iron/TIBC/Ferritin/ %Sat    Component Value Date/Time   IRON 44 12/30/2020 1437   TIBC 347 12/30/2020 1437   FERRITIN 15 12/30/2020 1437   IRONPCTSAT 13 12/30/2020 1437      RADIOGRAPHIC STUDIES: I have personally reviewed the radiological images as listed and agreed with the findings in the report. DG Chest 2 View  Result Date: 02/09/2021 CLINICAL DATA:  Non-small cell lung cancer status post left lower lobectomy EXAM: CHEST - 2 VIEW COMPARISON:  01/22/2021 FINDINGS: Frontal and lateral views of the chest demonstrate elevation of the left hemidiaphragm consistent with partial left pneumonectomy. No acute airspace disease or pneumothorax. Trace left pleural effusion versus pleural thickening again noted. Right chest is clear. There are no acute bony abnormalities. IMPRESSION: 1. Trace residual left pleural effusion versus pleural thickening. 2. Resolution of the left-sided pneumothorax seen previously. 3. Elevated left hemidiaphragm. Electronically Signed   By: Randa Ngo M.D.   On: 02/09/2021 15:26   DG Chest 2 View  Result Date: 01/20/2021 CLINICAL DATA:  Chest tube removal. EXAM: CHEST - 2 VIEW COMPARISON:  01/19/2021. FINDINGS: Interim significant progression of left hydropneumothorax. Right  lung is clear. Heart size difficult to assess. No acute bony abnormality elevation left hemidiaphragm again noted mild left chest wall subcutaneous emphysema. Surgical clips right upper quadrant. IMPRESSION: Interim significant progression of left hydropneumothorax. Electronically Signed   By: Marcello Moores  Register   On: 01/20/2021 08:43   DG Chest 1V REPEAT Same Day  Result Date: 01/21/2021 CLINICAL DATA:  Chest tube removal EXAM: CHEST - 1 VIEW SAME DAY COMPARISON:  01/21/2021 at 5:47 a.m. FINDINGS: The left pleural pigtail catheter is been removed. There is a less-than-5% lateral pneumothorax seen. Elevated left hemidiaphragm. Apical pleural capping on the left likely from pleural fluid. Continued volume loss in the left hemithorax. The right lung remains clear. IMPRESSION: 1. Less-than-5% left lateral pneumothorax, status post pleural drainage catheter removal. 2. Continued volume loss in the left hemithorax. Electronically Signed   By: Van Clines M.D.   On:  01/21/2021 14:32   DG Chest 1V REPEAT Same Day  Result Date: 01/19/2021 CLINICAL DATA:  Chest tube removal EXAM: CHEST - 1 VIEW SAME DAY COMPARISON:  01/19/2021 FINDINGS: Left chest tube removed. Small left lateral pneumothorax is slightly increased from earlier today. Moderate left lower lobe airspace disease with progression. Elevated left hemidiaphragm with small left effusion Right lung remains clear. Left jugular central venous catheter removed in the interval. IMPRESSION: Progression of small left lateral pneumothorax following chest tube removed. Progressive left lower lobe consolidation. Electronically Signed   By: Franchot Gallo M.D.   On: 01/19/2021 13:40   DG CHEST PORT 1 VIEW  Result Date: 01/22/2021 CLINICAL DATA:  Pneumothorax J93.9 (ICD-10-CM) EXAM: PORTABLE CHEST 1 VIEW COMPARISON:  01/21/2021 FINDINGS: Similar size of a small left pneumothorax. Similar volume loss in left with elevated left hemidiaphragm. Similar left lung  opacities. Right lung is clear. No right-sided pleural effusion or pneumothorax. Cardiomediastinal silhouette is similar to prior, partially obscured. Cholecystectomy clips. IMPRESSION: 1. Similar size of a small left pneumothorax. 2. Similar volume loss on the left with left lung opacities. Electronically Signed   By: Margaretha Sheffield MD   On: 01/22/2021 08:50   DG CHEST PORT 1 VIEW  Result Date: 01/21/2021 CLINICAL DATA:  Pneumothorax EXAM: PORTABLE CHEST 1 VIEW COMPARISON:  Chest x-ray dated January 20, 2021 FINDINGS: Cardiac and mediastinal contours are obscured. New trace left pneumothorax. Left chest tube in place. Heterogeneous opacities of the lower left lung, likely combination of pleural fluid and atelectasis. Right lung is clear. IMPRESSION: New trace left pneumothorax.  Left chest tube in place. Electronically Signed   By: Yetta Glassman MD   On: 01/21/2021 08:54   DG CHEST PORT 1 VIEW  Result Date: 01/20/2021 CLINICAL DATA:  Shortness of breath, chest pain EXAM: PORTABLE CHEST 1 VIEW COMPARISON:  01/20/2021, 01/19/2021, 01/18/2021 FINDINGS: Right lung is grossly clear. Volume loss on the left with shift of mediastinal contents, patient is status post left lower lobectomy. Interim placement of left-sided chest tube with pigtail over the left upper lung. Decreased effusion in the left thorax with residual lucency presumably due to pneumothorax. Airspace disease at left base. IMPRESSION: 1. Interim placement of left-sided chest tube with pigtail over the apical region 2. Decreased left pleural fluid with residual lucency in the left mid and upper thorax presumably due to pneumothorax. 3. Airspace disease at left base Electronically Signed   By: Donavan Foil M.D.   On: 01/20/2021 16:55   DG CHEST PORT 1 VIEW  Result Date: 01/19/2021 CLINICAL DATA:  Status post lobectomy of lung. EXAM: PORTABLE CHEST 1 VIEW COMPARISON:  01/18/2021 FINDINGS: Left jugular central venous catheter appears stable with  the tip near the superior cavoatrial junction. Left chest tube is stable and along the medial aspect of left hemithorax. Evidence for a persistent small left pneumothorax with pleural line noted along the left costophrenic angle and residual lucency at the left lung apex. Stable volume loss in the left hemithorax. Right lung remains clear. Cardiomediastinal silhouette is stable. IMPRESSION: 1. Stable position of the left chest tube. Persistent small left pneumothorax that has minimally changed. Electronically Signed   By: Markus Daft M.D.   On: 01/19/2021 08:26   CT [facility] PLEURAL DRAIN W/INDWELL CATH W/IMG GUIDE  Result Date: 01/20/2021 INDICATION: Recurrent left hydropneumothorax status post left lower lobectomy. EXAM: CT-guided left chest tube placement MEDICATIONS: The patient is currently admitted to the hospital and receiving intravenous antibiotics. The antibiotics were administered  within an appropriate time frame prior to the initiation of the procedure. ANESTHESIA/SEDATION: Fentanyl 75 mcg IV Moderate Sedation Time:  16 minutes The patient was continuously monitored during the procedure by the interventional radiology nurse under my direct supervision. COMPLICATIONS: None immediate. PROCEDURE: Informed written consent was obtained from the patient after a thorough discussion of the procedural risks, benefits and alternatives. All questions were addressed. Maximal Sterile Barrier Technique was utilized including caps, mask, sterile gowns, sterile gloves, sterile drape, hand hygiene and skin antiseptic. A timeout was performed prior to the initiation of the procedure. Patient positioned supine on the procedure table. The left anterior chest wall skin prepped and draped in usual sterile fashion. Following local lidocaine administration, 17 gauge introducer needle was advanced into the left hydropneumothorax utilizing CT guidance. 17 gauge needle removed over 0.035 inch guidewire. Serial dilation was performed  and 14 French drain was inserted. Drain secured to skin with suture and connected to Pleur-Evac. Patient tolerated procedure well without complication. IMPRESSION: 28 French left chest tube placed utilizing CT guidance. Electronically Signed   By: Miachel Roux M.D.   On: 01/20/2021 13:06      ASSESSMENT & PLAN:  1. Non-small cell cancer of left lung ([place])   2. Goals of care, counseling/discussion   3. Stage 3b chronic kidney disease ([place])   4. Normocytic anemia   5. Hypoglycemia   Cancer Staging Non-small cell lung cancer ([place]) Staging form: Lung, AJCC 8th Edition - Pathologic stage from 01/28/2021: Stage IIB (pT1c, pN1, cM0) - Signed by Earlie Server, MD on 01/28/2021  #Stage IIIA left lung non-small cell lung cancer-.  Poorly differentiated adenocarcinoma.  Status post left lower lobectomy and lymph node dissection pT1c pN2 [pathology addendum changed to N2]  Recommend adjuvant chemotherapy using regimen carboplatin and Taxol every 3 weeks for 4 cycles.  Patient had a TPS of 1, could benefit from adjuvant immunotherapy atezolizumab for up to 1 year. Cisplatin is contraindicated due to her kidney function.  Today's kidney function revealed a stage III CKD, CrCl using Cockcroft-Gault formula is < 45.  Therefore Alimta is contraindicated.  The diagnosis and care plan were discussed with patient in detail.  NCCN guidelines were reviewed and shared with patient.   The goal of treatment which is with curative intent. chemotherapy education was provided.  We had discussed the composition of chemotherapy regimen, length of chemo cycle, duration of treatment and the time to assess response to treatment.    I explained to the patient the risks and benefits of chemotherapy including all but not limited to hair loss, mouth sore, nausea, vomiting, diarrhea, low blood counts, bleeding, neuropathy and risk of life threatening infection and even death, secondary malignancy etc.  . Patient voices understanding and  willing to proceed chemotherapy.   # Chemotherapy education;  Antiemetics-Zofran and Compazine; EMLA cream sent to pharmacy   #Tobacco use, smoke cessation was discussed with patient. #Normocytic anemia, probably secondary to chronic kidney disease.   Vitamin B12 is low normal, will proceed with vitamin B12 1000 MCG IM injection.  #Diabetes with hyperglycemia episode.  Recommend patient to close monitor blood sugar and follow-up with primary care provider.  All questions were answered. The patient knows to call the clinic with any problems questions or concerns.  cc [name], MD   Return of visit: Day 1 of chemotherapy treatment.    Earlie Server, MD, PhD Hematology Oncology [place] [facility]  02/17/2021

## 2021-02-17 NOTE — Progress Notes (Signed)
DISCONTINUE ON PATHWAY REGIMEN - Non-Small Cell Lung     A cycle is every 21 days:     Pemetrexed      Carboplatin   **Always confirm dose/schedule in your pharmacy ordering system**  REASON: Other Reason PRIOR TREATMENT: [id number]: Carboplatin AUC=5 + Pemetrexed 500 mg/m2 q21 Days x 4 Cycles TREATMENT RESPONSE: N/A - Adjuvant Therapy  START ON PATHWAY REGIMEN - Non-Small Cell Lung     A cycle is every 21 days:     Pemetrexed      Carboplatin   **Always confirm dose/schedule in your pharmacy ordering system**  Patient Characteristics: Postoperative without Neoadjuvant Therapy (Pathologic Staging), Stage III, Adjuvant Chemotherapy, Nonsquamous Cell Therapeutic Status: Postoperative without Neoadjuvant Therapy (Pathologic Staging) AJCC T Category: pT1c AJCC N Category: pN2 AJCC M Category: cM0 AJCC 8 Stage Grouping: IIIA Histology: Nonsquamous Cell Intent of Therapy: Curative Intent, Discussed with Patient

## 2021-02-17 NOTE — Progress Notes (Signed)
Met with patient during follow up appt with Dr. [name] post lung resection surgery to discuss adjuvant chemotherapy. Pt checked blood sugar during visit due to feeling dizzy. BS at that time as 37. Pt given soda and cookies and rechecked BS prior to leaving which was 111. Pt stated she was feeling better at that time. Reviewed upcoming appts for treatment. Pt given resources regarding diagnosis and supportive services available. Contact info given. Instructed to call with any questions or needs. Pt verbalized understanding.

## 2021-02-17 NOTE — Progress Notes (Signed)
DISCONTINUE ON PATHWAY REGIMEN - Non-Small Cell Lung     A cycle is every 21 days:     Pemetrexed      Carboplatin   **Always confirm dose/schedule in your pharmacy ordering system**  REASON: Other Reason PRIOR TREATMENT: [id number]: Carboplatin AUC=5 + Pemetrexed 500 mg/m2 q21 Days x 4 Cycles TREATMENT RESPONSE: N/A - Adjuvant Therapy  START ON PATHWAY REGIMEN - Non-Small Cell Lung     A cycle is every 21 days:     Paclitaxel      Carboplatin   **Always confirm dose/schedule in your pharmacy ordering system**  Patient Characteristics: Postoperative without Neoadjuvant Therapy (Pathologic Staging), Stage III, Adjuvant Chemotherapy, Nonsquamous Cell Therapeutic Status: Postoperative without Neoadjuvant Therapy (Pathologic Staging) AJCC T Category: pT1c AJCC N Category: pN2 AJCC M Category: cM0 AJCC 8 Stage Grouping: IIIA Histology: Nonsquamous Cell Intent of Therapy: Curative Intent, Discussed with Patient

## 2021-02-17 NOTE — Progress Notes (Signed)
START ON PATHWAY REGIMEN - Non-Small Cell Lung     A cycle is every 21 days:     Pemetrexed      Carboplatin   **Always confirm dose/schedule in your pharmacy ordering system**  Patient Characteristics: Postoperative without Neoadjuvant Therapy (Pathologic Staging), Stage IIB, Adjuvant Chemotherapy, Nonsquamous Cell Therapeutic Status: Postoperative without Neoadjuvant Therapy (Pathologic Staging) AJCC T Category: pT1c AJCC N Category: pN1 AJCC M Category: cM0 AJCC 8 Stage Grouping: IIB Histology: Nonsquamous Cell Intent of Therapy: Curative Intent, Discussed with Patient

## 2021-02-18 ENCOUNTER — Telehealth: Payer: Self-pay

## 2021-02-18 NOTE — Telephone Encounter (Signed)
[facility]  NSCLC - Procurement of Human Biospecimen's for the Discovery and Validation of Biomarker's for the Prediction, Diagnosis, and Management of Disease   Study Introduction:  [name] [name] was contacted via telephone encounter by this Research officer, political party. The informed consent (version 2, revised 12/23/2019) and HIPAA (version 5, revised 06/04/2019) forms for the [facility] NSCLC specimen collection study were reviewed with the patient at this time. She states she is interested and wants to participate in the study. The patient understands that her participation is voluntary and she can withdraw from the study at any time.   Plan:  Research team will meet with the patient on Tuesday, February 22, 2021 after her chemotherapy education appointment to review the consent and HIPPA in detail. If the patient is still interested and signs consent to participate in the study, the required labs would be obtained at her next appointment on February 28, 2021 prior to her infusion and visit with Dr. [name]. The patient is in agreement with these arrangements.  Jeral Fruit, RN 02/18/21 10:57 AM

## 2021-02-18 NOTE — Telephone Encounter (Signed)
-----   Message from Earlie Server, MD sent at 02/17/2021  5:25 PM EDT ----- We need to change the plan as her kidney function shows creatinine clearance less than 45.  Please change chemotherapy education to carboplatin/Taxol every 3 weeks regimen.  Keep vitamin B12 injection appointment. Lab MD carboplatin Taxol on 02/28/2021.

## 2021-02-18 NOTE — Telephone Encounter (Signed)
Please adjust plan as requested by MD.

## 2021-02-21 ENCOUNTER — Other Ambulatory Visit: Payer: Self-pay | Admitting: Family Medicine

## 2021-02-21 DIAGNOSIS — I25118 Atherosclerotic heart disease of native coronary artery with other forms of angina pectoris: Secondary | ICD-10-CM

## 2021-02-22 ENCOUNTER — Inpatient Hospital Stay: Payer: Medicaid Other

## 2021-02-22 ENCOUNTER — Encounter: Payer: Self-pay | Admitting: Oncology

## 2021-02-24 ENCOUNTER — Other Ambulatory Visit: Payer: Self-pay

## 2021-02-24 ENCOUNTER — Inpatient Hospital Stay: Payer: Medicaid Other

## 2021-02-24 DIAGNOSIS — D649 Anemia, unspecified: Secondary | ICD-10-CM

## 2021-02-24 DIAGNOSIS — C3492 Malignant neoplasm of unspecified part of left bronchus or lung: Secondary | ICD-10-CM

## 2021-02-24 DIAGNOSIS — Z5111 Encounter for antineoplastic chemotherapy: Secondary | ICD-10-CM | POA: Diagnosis not present

## 2021-02-24 MED ORDER — CYANOCOBALAMIN 1000 MCG/ML IJ SOLN
1000.0000 ug | Freq: Once | INTRAMUSCULAR | Status: AC
  Administered 2021-02-24: 1000 ug via INTRAMUSCULAR
  Filled 2021-02-24: qty 1

## 2021-02-24 NOTE — Research (Signed)
Trial Name:  [place]  Patient [name] [name] was identified by Jeral Fruit, RN as a potential candidate for the above listed study.  This Clinical Research Nurse met with [name] [name], [id number] on 02/24/21 in a manner and location that ensures patient privacy to discuss participation in the above listed research study.  Patient is Accompanied by her cousin .  Patient was previously provided with informed consent documents.  Patient has not yet read the informed consent documents and so documents were reviewed page by page today.  As outlined in the informed consent form, this Nurse and [name] [name] discussed the purpose of the research study, the investigational nature of the study, study procedures and requirements for study participation, potential risks and benefits of study participation, as well as alternatives to participation.  This study is not blinded or double-blinded. The patient understands participation is voluntary and they may withdraw from study participation at any time.  This study does not involve randomization.  This study does not involve an investigational drug or device. This study does not involve a placebo. Patient understands enrollment is pending full eligibility review.   Confidentiality and how the patient's information will be used as part of study participation were discussed.  Patient was informed there is reimbursement provided for their time and effort spent on trial participation.  The patient is encouraged to discuss research study participation with their insurance provider to determine what costs they may incur as part of study participation, including research related injury.    All questions were answered to patient's satisfaction.  The informed consent and separate HIPAA Authorization was reviewed page by page.  The patient's mental and emotional status is appropriate to provide informed consent, and the patient verbalizes an understanding of study  participation.  Patient has agreed to participate in the above listed research study and has voluntarily signed the informed consent version 3 dated 01/21/2021 and separate HIPAA Authorization, version 5 dated 01/21/2021  on 02/24/21 at 1118AM.  The patient was provided with a copy of the signed informed consent form and separate HIPAA Authorization for their reference.  No study specific procedures were obtained prior to the signing of the informed consent document.  Approximately 25 minutes were spent with the patient reviewing the informed consent documents.  After obtaining informed consent patient, voluntarily signed the optional Release of Information form for use throughout trial participation.  Patient is aware that her study labs will be drawn at her next lab scheduled appointment on 02/28/2021, she will receive her $50 gift card after her labs are drawn that day. The patient was made aware that this research nurse will be on vacation that day, but [name] [name], [name] [name] will be with her to complete the required study lab draw and provide her with her gift card.  Second eligibility check for this patient was completed by [name] [name], CRC.  Jeral Fruit, RN 11:32 AM 02/24/21

## 2021-02-28 ENCOUNTER — Ambulatory Visit: Payer: Medicaid Other

## 2021-02-28 ENCOUNTER — Other Ambulatory Visit: Payer: Self-pay

## 2021-02-28 ENCOUNTER — Inpatient Hospital Stay: Payer: Medicaid Other

## 2021-02-28 ENCOUNTER — Encounter: Payer: Self-pay | Admitting: Oncology

## 2021-02-28 ENCOUNTER — Inpatient Hospital Stay (HOSPITAL_BASED_OUTPATIENT_CLINIC_OR_DEPARTMENT_OTHER): Payer: Medicaid Other | Admitting: Oncology

## 2021-02-28 VITALS — BP 129/79 | HR 83 | Temp 97.0°F | Resp 19

## 2021-02-28 VITALS — BP 95/60 | HR 89 | Temp 97.8°F | Resp 20 | Wt 110.4 lb

## 2021-02-28 DIAGNOSIS — C3492 Malignant neoplasm of unspecified part of left bronchus or lung: Secondary | ICD-10-CM | POA: Diagnosis not present

## 2021-02-28 DIAGNOSIS — N1832 Chronic kidney disease, stage 3b: Secondary | ICD-10-CM | POA: Diagnosis not present

## 2021-02-28 DIAGNOSIS — Z5111 Encounter for antineoplastic chemotherapy: Secondary | ICD-10-CM | POA: Diagnosis not present

## 2021-02-28 DIAGNOSIS — Z720 Tobacco use: Secondary | ICD-10-CM

## 2021-02-28 DIAGNOSIS — D649 Anemia, unspecified: Secondary | ICD-10-CM

## 2021-02-28 LAB — CBC WITH DIFFERENTIAL/PLATELET
Abs Immature Granulocytes: 0.01 10*3/uL (ref 0.00–0.07)
Basophils Absolute: 0 10*3/uL (ref 0.0–0.1)
Basophils Relative: 1 %
Eosinophils Absolute: 0.2 10*3/uL (ref 0.0–0.5)
Eosinophils Relative: 5 %
HCT: 33.5 % — ABNORMAL LOW (ref 36.0–46.0)
Hemoglobin: 10.1 g/dL — ABNORMAL LOW (ref 12.0–15.0)
Immature Granulocytes: 0 %
Lymphocytes Relative: 32 %
Lymphs Abs: 1.5 10*3/uL (ref 0.7–4.0)
MCH: 28.2 pg (ref 26.0–34.0)
MCHC: 30.1 g/dL (ref 30.0–36.0)
MCV: 93.6 fL (ref 80.0–100.0)
Monocytes Absolute: 0.5 10*3/uL (ref 0.1–1.0)
Monocytes Relative: 10 %
Neutro Abs: 2.4 10*3/uL (ref 1.7–7.7)
Neutrophils Relative %: 52 %
Platelets: 439 10*3/uL — ABNORMAL HIGH (ref 150–400)
RBC: 3.58 MIL/uL — ABNORMAL LOW (ref 3.87–5.11)
RDW: 14.5 % (ref 11.5–15.5)
WBC: 4.7 10*3/uL (ref 4.0–10.5)
nRBC: 0 % (ref 0.0–0.2)

## 2021-02-28 LAB — COMPREHENSIVE METABOLIC PANEL
ALT: 28 U/L (ref 0–44)
AST: 33 U/L (ref 15–41)
Albumin: 3.3 g/dL — ABNORMAL LOW (ref 3.5–5.0)
Alkaline Phosphatase: 118 U/L (ref 38–126)
Anion gap: 6 (ref 5–15)
BUN: 34 mg/dL — ABNORMAL HIGH (ref 8–23)
CO2: 28 mmol/L (ref 22–32)
Calcium: 8.9 mg/dL (ref 8.9–10.3)
Chloride: 106 mmol/L (ref 98–111)
Creatinine, Ser: 1.35 mg/dL — ABNORMAL HIGH (ref 0.44–1.00)
GFR, Estimated: 44 mL/min — ABNORMAL LOW (ref >60)
Glucose, Bld: 189 mg/dL — ABNORMAL HIGH (ref 70–99)
Potassium: 4 mmol/L (ref 3.5–5.1)
Sodium: 140 mmol/L (ref 135–145)
Total Bilirubin: 0.4 mg/dL (ref 0.3–1.2)
Total Protein: 6.3 g/dL — ABNORMAL LOW (ref 6.5–8.1)

## 2021-02-28 MED ORDER — SODIUM CHLORIDE 0.9 % IV SOLN
298.5000 mg | Freq: Once | INTRAVENOUS | Status: AC
  Administered 2021-02-28: 300 mg via INTRAVENOUS
  Filled 2021-02-28: qty 30

## 2021-02-28 MED ORDER — SODIUM CHLORIDE 0.9 % IV SOLN
150.0000 mg | Freq: Once | INTRAVENOUS | Status: AC
  Administered 2021-02-28: 150 mg via INTRAVENOUS
  Filled 2021-02-28: qty 150

## 2021-02-28 MED ORDER — FAMOTIDINE 20 MG IN NS 100 ML IVPB
20.0000 mg | Freq: Once | INTRAVENOUS | Status: AC
  Administered 2021-02-28: 20 mg via INTRAVENOUS
  Filled 2021-02-28: qty 20

## 2021-02-28 MED ORDER — DIPHENHYDRAMINE HCL 50 MG/ML IJ SOLN
50.0000 mg | Freq: Once | INTRAMUSCULAR | Status: AC
  Administered 2021-02-28: 50 mg via INTRAVENOUS
  Filled 2021-02-28: qty 1

## 2021-02-28 MED ORDER — SODIUM CHLORIDE 0.9 % IV SOLN
10.0000 mg | Freq: Once | INTRAVENOUS | Status: AC
  Administered 2021-02-28: 10 mg via INTRAVENOUS
  Filled 2021-02-28: qty 10

## 2021-02-28 MED ORDER — SODIUM CHLORIDE 0.9 % IV SOLN
200.0000 mg/m2 | Freq: Once | INTRAVENOUS | Status: AC
  Administered 2021-02-28: 306 mg via INTRAVENOUS
  Filled 2021-02-28: qty 51

## 2021-02-28 MED ORDER — PALONOSETRON HCL INJECTION 0.25 MG/5ML
0.2500 mg | Freq: Once | INTRAVENOUS | Status: AC
  Administered 2021-02-28: 0.25 mg via INTRAVENOUS
  Filled 2021-02-28: qty 5

## 2021-02-28 MED ORDER — SODIUM CHLORIDE 0.9 % IV SOLN
Freq: Once | INTRAVENOUS | Status: AC
  Filled 2021-02-28: qty 250

## 2021-02-28 NOTE — Progress Notes (Signed)
Hematology/Oncology progress note [name] Telephone:([phone] [phone]   Patient Care Team: [name], MD as PCP - General (Family Medicine)  REFERRING PROVIDER: [name], MD  CHIEF COMPLAINTS/REASON FOR VISIT:  Follow up for stage IIIA lung cancer  HISTORY OF PRESENTING ILLNESS:  Patient was recently seen by urology Dr. Candiss Norse ski for evaluation of microscopic hematuria, frequent UTI. 11/17/2020, CT hematuria work-up was obtained which showed no evidence of urinary tract calculus or hydronephrosis.  Small left renal cyst. Incidental findings of 1.9 x 1.2 cm left lower lobe pulmonary nodule worrisome for malignancy. Patient was referred to establish care with oncology for further evaluation  .  Patient denies any shortness of breath, cough, hemoptysis, unintentional weight loss, fever or night sweats. She currently smokes 1 cigar/day.  She started smoking cigarettes at age of 18.  She quitted at age of 25 and restarted smoking cigar a few years ago.  Patient has a history of depression and is on Cymbalta.  Diabetes, hyperlipidemia, hypertension, peripheral artery disease Per patient she also has history of heart attack in 2011. History of right lower extremity BKA in 2021, 02/15/2020 history of pulmonary embolism involving segmental and subsegmental branches of right upper, middle, lower lobe pulmonary arteries.  She was put on on apixaban. Patient lives with her aunt.  12/07/2020, PET scan showed left lower lobe 1.3 x 1.8 cm lung nodule with SUV of 6.5.  Suspect early stage primary lung neoplasm.  No hilar or mediastinal lymphadenopathy activity.  # 12/23/2020 S/p CT guided biopsy of left lower lobe mass.  Pathology showed non-small cell carcinoma, favor adenocarcinoma.  Positive for TTF-1, negative for p40.     # 01/17/2021, patient underwent left lower lobectomy with lymph node dissection. Pathology showed invasive poorly  differentiated adenocarcinoma, 2.2 cm.  Tumor abuts pleura but visceral pleural surface is not involved by carcinoma.  LVI invasion is present, resection margins are negative for carcinoma.  10 lymph nodes were harvested and 3 lymph nodes were involved with carcinoma. pT1c pN2 Patient's case was discussed at [facility] oncology tumor board.  And consensus recommendation was adjuvant chemotherapy and sent tissue for molecular/PD-L1 testing.  Foundation one testing showed PD-L1 1,  No reportable alterations ERBB2 [id number]    INTERVAL HISTORY [name] [name] is a 65 y.o. 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  No hypoglycemia symptoms today. She has had chemotherapy education. Presents for evaluation prior to lung cancer  Review of Systems  Constitutional:  Negative for appetite change, chills, fatigue and fever.  HENT:   Negative for hearing loss and voice change.   Eyes:  Negative for eye problems.  Respiratory:  Negative for chest tightness and cough.   Cardiovascular:  Negative for chest pain.  Gastrointestinal:  Negative for abdominal distention, abdominal pain and blood in stool.  Endocrine: Negative for hot flashes.  Genitourinary:  Negative for difficulty urinating and frequency.   Musculoskeletal:  Negative for arthralgias.  Skin:  Negative for itching and rash.  Neurological:  Negative for extremity weakness.  Hematological:  Negative for adenopathy.  Psychiatric/Behavioral:  Negative for confusion.    MEDICAL HISTORY:  Past Medical History:  Diagnosis Date   Anemia    Cancer ([place])    CHF (congestive heart failure) ([place])    02/16/20 HFrEF 20-25% in setting of acute  DVT/PE, underlying CAD   CKD (chronic kidney disease)    Coronary artery disease    02/20/20: CTO pRCA with left-to-right collaterals, 50% mLAD, 80% OM1,  medical therapy   Depression    Diabetes ([place])    Diabetes mellitus without complication ([place])    DVT (deep venous thrombosis) ([place]) 02/16/2020   RLE DVT proximal veins 02/16/20 Duplex   Hyperlipidemia    Hypertension    Myocardial infarction [facility]) 2009   Stress related per patient; NSTEMI 2012 100% RCA with left-to-right collaterals   PE (pulmonary thromboembolism) ([place]) 02/15/2020   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, RLE BKA   Peripheral artery disease ([place])     SURGICAL HISTORY: Past Surgical History:  Procedure Laterality Date   Right lower extremity BKA Right     SOCIAL HISTORY: Social History   Socioeconomic History   Marital status: Married    Spouse name: Not on file   Number of children: Not on file   Years of education: Not on file   Highest education level: Not on file  Occupational History   Not on file  Tobacco Use   Smoking status: Former    Packs/day: 0.25    Types: Cigars, Cigarettes    Quit date: 01/17/2021    Years since quitting: 0.1   Smokeless tobacco: Never  Vaping Use   Vaping Use: Never used  Substance and Sexual Activity   Alcohol use: Never   Drug use: Not Currently   Sexual activity: Not Currently  Other Topics Concern   Not on file  Social History Narrative   Not on file   Social Determinants of Health   Financial Resource Strain: Not on file  Food Insecurity: Not on file  Transportation Needs: Not on file  Physical Activity: Not on file  Stress: Not on file  Social Connections: Not on file  Intimate Partner Violence: Not on file    FAMILY HISTORY: Family History  Problem Relation Age of Onset   Heart disease Mother    Diabetes Mother    Heart disease Father    Diabetes Father     ALLERGIES:  is allergic to lisinopril.  MEDICATIONS:  Current Outpatient Medications  Medication Sig Dispense Refill   ACCU-CHEK GUIDE test strip USE UP TP 4 TIMES A DAY AS DIRECTED 100 strip 1   Accu-Chek Softclix Lancets  lancets SMARTSIG:Topical 1-4 Times Daily     aspirin EC 81 MG tablet Take 81 mg by mouth in the morning. Swallow whole.     atorvastatin (LIPITOR) 80 MG tablet Take 1 tablet (80 mg total) by mouth daily. 90 tablet 1   blood glucose meter kit and supplies KIT Dispense based on patient and insurance preference. Use up to four times daily as directed. 1 each 0   carboxymethylcellulose (REFRESH PLUS) 0.5 % SOLN Place 1 drop into both eyes in the morning and at bedtime.     diphenhydrAMINE (BENADRYL) 25 MG tablet Take 25 mg by mouth at bedtime.     docusate sodium (COLACE) 100 MG capsule Take 1 capsule (100 mg total) by mouth 2 (two) times daily. 60 capsule 1   DULoxetine (CYMBALTA) 20 MG capsule Take 2 capsules (40 mg total) by mouth in the morning and at bedtime. 120 capsule 2   gabapentin (NEURONTIN) 100 MG capsule Take 1 capsule (100 mg total) by mouth 2 (two) times daily. 60 capsule 5   HUMALOG KWIKPEN 100 UNIT/ML KwikPen Inject 8 Units into the  skin 3 (three) times daily after meals.     hydrALAZINE (APRESOLINE) 100 MG tablet Take 1 tablet (100 mg total) by mouth 3 (three) times daily. 270 tablet 0   isosorbide dinitrate (ISORDIL) 30 MG tablet TAKE 1 TABLET (30 MG TOTAL) BY MOUTH IN THE MORNING, AT NOON, AND AT BEDTIME. 90 tablet 0   JARDIANCE 10 MG TABS tablet TAKE 1 TABLET BY MOUTH EVERY DAY 90 tablet 0   ketoconazole (NIZORAL) 2 % cream Apply 1 application topically in the morning and at bedtime. Applied to feet 60 g 1   LANTUS SOLOSTAR 100 UNIT/ML Solostar Pen 16 Units at bedtime.     metFORMIN (GLUCOPHAGE-XR) 500 MG 24 hr tablet TAKE 1 TABLET BY MOUTH TWICE A DAY 180 tablet 0   metoprolol succinate (TOPROL-XL) 50 MG 24 hr tablet Take 1 tablet (50 mg total) by mouth daily. Take with or immediately following a meal. 90 tablet 1   Multiple Vitamin (MULTIVITAMIN WITH MINERALS) TABS tablet Take 1 tablet by mouth in the morning. Adults 50+     ondansetron (ZOFRAN) 8 MG tablet Take 1 tablet (8 mg  total) by mouth 2 (two) times daily as needed (Nausea or vomiting). Start if needed on the third day after cisplatin. 30 tablet 1   prochlorperazine (COMPAZINE) 10 MG tablet Take 1 tablet (10 mg total) by mouth every 6 (six) hours as needed (Nausea or vomiting). 30 tablet 1   spironolactone (ALDACTONE) 25 MG tablet TAKE 1 TABLET (25 MG TOTAL) BY MOUTH DAILY. 90 tablet 0   No current facility-administered medications for this visit.   Facility-Administered Medications Ordered in Other Visits  Medication Dose Route Frequency Provider Last Rate Last Admin   CARBOplatin (PARAPLATIN) 300 mg in sodium chloride 0.9 % 250 mL chemo infusion  300 mg Intravenous Once Earlie Server, MD       PACLitaxel (TAXOL) 306 mg in sodium chloride 0.9 % 500 mL chemo infusion (> 15m/m2)  200 mg/m2 (Treatment Plan Recorded) Intravenous Once YEarlie Server MD 184 mL/hr at 02/28/21 1128 306 mg at 02/28/21 1128     PHYSICAL EXAMINATION: ECOG PERFORMANCE STATUS: 1 - Symptomatic but completely ambulatory Vitals:   02/28/21 0837  BP: 95/60  Pulse: 89  Resp: 20  Temp: 97.8 F (36.6 C)  SpO2: 99%   Filed Weights   02/28/21 0837  Weight: 110 lb 6.4 oz (50.1 kg)    Physical Exam Constitutional:      General: She is not in acute distress. HENT:     Head: Normocephalic and atraumatic.  Eyes:     General: No scleral icterus. Cardiovascular:     Rate and Rhythm: Normal rate and regular rhythm.     Heart sounds: Normal heart sounds.  Pulmonary:     Effort: Pulmonary effort is normal. No respiratory distress.     Breath sounds: No wheezing.  Abdominal:     General: Bowel sounds are normal. There is no distension.     Palpations: Abdomen is soft.  Musculoskeletal:        General: No deformity. Normal range of motion.     Cervical back: Normal range of motion and neck supple.     Comments: Right lower extremity BKA  Skin:    General: Skin is warm and dry.     Findings: No erythema or rash.  Neurological:     Mental  Status: She is alert and oriented to person, place, and time. Mental status is at baseline.  Cranial Nerves: No cranial nerve deficit.     Coordination: Coordination normal.  Psychiatric:        Mood and Affect: Mood normal.    LABORATORY DATA:  I have reviewed the data as listed Lab Results  Component Value Date   WBC 4.7 02/28/2021   HGB 10.1 (L) 02/28/2021   HCT 33.5 (L) 02/28/2021   MCV 93.6 02/28/2021   PLT 439 (H) 02/28/2021   Recent Labs    08/10/20 1658 11/17/20 1149 01/19/21 0030 01/25/21 0000 02/17/21 1108 02/28/21 0820  NA 136   < > 135 141 140 140  K 4.6   < > 4.7 4.1 3.3* 4.0  CL 101   < > 104  --  102 106  CO2 17*   < > 24  --  27 28  GLUCOSE 485*   < > 174*  --  85 189*  BUN 39*   < > 38* 23* 21 34*  CREATININE 1.18*   < > 1.32* 1.2* 1.32* 1.35*  CALCIUM 9.2   < > 8.3*  --  8.9 8.9  GFRNONAA 50*   < > 46*  --  46* 44*  GFRAA 57*  --   --   --   --   --   PROT  --    < > 5.0*  --  6.6 6.3*  ALBUMIN 3.2*   < > 2.5*  --  3.5 3.3*  AST  --    < > 27  --  32 33  ALT  --    < > 8  --  17 28  ALKPHOS  --    < > 68  --  136* 118  BILITOT  --    < > 0.4  --  0.3 0.4   < > = values in this interval not displayed.    Iron/TIBC/Ferritin/ %Sat    Component Value Date/Time   IRON 44 12/30/2020 1437   TIBC 347 12/30/2020 1437   FERRITIN 15 12/30/2020 1437   IRONPCTSAT 13 12/30/2020 1437      RADIOGRAPHIC STUDIES: I have personally reviewed the radiological images as listed and agreed with the findings in the report. DG Chest 2 View  Result Date: 02/09/2021 CLINICAL DATA:  Non-small cell lung cancer status post left lower lobectomy EXAM: CHEST - 2 VIEW COMPARISON:  01/22/2021 FINDINGS: Frontal and lateral views of the chest demonstrate elevation of the left hemidiaphragm consistent with partial left pneumonectomy. No acute airspace disease or pneumothorax. Trace left pleural effusion versus pleural thickening again noted. Right chest is clear. There are no  acute bony abnormalities. IMPRESSION: 1. Trace residual left pleural effusion versus pleural thickening. 2. Resolution of the left-sided pneumothorax seen previously. 3. Elevated left hemidiaphragm. Electronically Signed   By: Randa Ngo M.D.   On: 02/09/2021 15:26      ASSESSMENT & PLAN:  1. Non-small cell cancer of left lung ([place])   2. Encounter for antineoplastic chemotherapy   3. Normocytic anemia   4. Stage 3b chronic kidney disease ([place])   5. Tobacco abuse   Cancer Staging Non-small cell lung cancer [facility]) Staging form: Lung, AJCC 8th Edition - Pathologic stage from 01/28/2021: Stage IIIA (pT1c, pN2, cM0) - Signed by Earlie Server, MD on 02/17/2021  #Stage IIIA left lung non-small cell lung cancer-.  Poorly differentiated adenocarcinoma.  Status post left lower lobectomy and lymph node dissection pT1c pN2 [pathology addendum changed to N2]  Recommend adjuvant chemotherapy using regimen carboplatin  and Taxol every 3 weeks for 4 cycles.  Patient had a TPS of 1, could benefit from adjuvant immunotherapy atezolizumab for up to 1 year. Labs reviewed and discussed with patient. Proceed with cycle 1 carboplatin AUC of 5, Taxol 200 mg/m2 # Chemotherapy education;  Antiemetics-Zofran and Compazine; sent to pharmacy  #Tobacco use, continue smoke cessation efforts.[place] Kitchen #Normocytic anemia, probably secondary to chronic kidney disease.   Vitamin B12 is low normal, status post vitamin B12 1000 MCG IM injection.  X1.  We will follow-up B12 in the future. All questions were answered. The patient knows to call the clinic with any problems questions or concerns.  cc [name], MD   Return of visit: 1 week lab MD for evaluation of tolerability. Lab MD 3 weeks with carboplatin and Taxol.    Earlie Server, MD, PhD Hematology Oncology [place] at [facility]  02/28/2021

## 2021-02-28 NOTE — Patient Instructions (Signed)
[facility]  Discharge Instructions: Thank you for choosing [facility] to provide your oncology and hematology care.  If you have a lab appointment with the [facility], please go directly to the [facility] and check in at the registration area.  Wear comfortable clothing and clothing appropriate for easy access to any Portacath or PICC line.   We strive to give you quality time with your provider. You may need to reschedule your appointment if you arrive late (15 or more minutes).  Arriving late affects you and other patients whose appointments are after yours.  Also, if you miss three or more appointments without notifying the office, you may be dismissed from the clinic at the provider's discretion.      For prescription refill requests, have your pharmacy contact our office and allow 72 hours for refills to be completed.    Today you received the following chemotherapy and/or immunotherapy agents Taxol & Carboplatin  Carboplatin injection What is this medication? CARBOPLATIN (KAR boe pla tin) is a chemotherapy drug. It targets fast dividing cells, like cancer cells, and causes these cells to die. This medicine is used to treat ovarian cancer and many other cancers. This medicine may be used for other purposes; ask your health care provider or pharmacist if you have questions. COMMON BRAND NAME(S): Paraplatin What should I tell my care team before I take this medication? They need to know if you have any of these conditions: blood disorders hearing problems kidney disease recent or ongoing radiation therapy an unusual or allergic reaction to carboplatin, cisplatin, other chemotherapy, other medicines, foods, dyes, or preservatives pregnant or trying to get pregnant breast-feeding How should I use this medication? This drug is usually given as an infusion into a vein. It is administered in a hospital or clinic by a specially  trained health care professional. Talk to your pediatrician regarding the use of this medicine in children. Special care may be needed. Overdosage: If you think you have taken too much of this medicine contact a poison control center or emergency room at once. NOTE: This medicine is only for you. Do not share this medicine with others. What if I miss a dose? It is important not to miss a dose. Call your doctor or health care professional if you are unable to keep an appointment. What may interact with this medication? medicines for seizures medicines to increase blood counts like filgrastim, pegfilgrastim, sargramostim some antibiotics like amikacin, gentamicin, neomycin, streptomycin, tobramycin vaccines Talk to your doctor or health care professional before taking any of these medicines: acetaminophen aspirin ibuprofen ketoprofen naproxen This list may not describe all possible interactions. Give your health care provider a list of all the medicines, herbs, non-prescription drugs, or dietary supplements you use. Also tell them if you smoke, drink alcohol, or use illegal drugs. Some items may interact with your medicine. What should I watch for while using this medication? Your condition will be monitored carefully while you are receiving this medicine. You will need important blood work done while you are taking this medicine. This drug may make you feel generally unwell. This is not uncommon, as chemotherapy can affect healthy cells as well as cancer cells. Report any side effects. Continue your course of treatment even though you feel ill unless your doctor tells you to stop. In some cases, you may be given additional medicines to help with side effects. Follow all directions for their use. Call your doctor or health  care professional for advice if you get a fever, chills or sore throat, or other symptoms of a cold or flu. Do not treat yourself. This drug decreases your body's ability to  fight infections. Try to avoid being around people who are sick. This medicine may increase your risk to bruise or bleed. Call your doctor or health care professional if you notice any unusual bleeding. Be careful brushing and flossing your teeth or using a toothpick because you may get an infection or bleed more easily. If you have any dental work done, tell your dentist you are receiving this medicine. Avoid taking products that contain aspirin, acetaminophen, ibuprofen, naproxen, or ketoprofen unless instructed by your doctor. These medicines may hide a fever. Do not become pregnant while taking this medicine. Women should inform their doctor if they wish to become pregnant or think they might be pregnant. There is a potential for serious side effects to an unborn child. Talk to your health care professional or pharmacist for more information. Do not breast-feed an infant while taking this medicine. What side effects may I notice from receiving this medication? Side effects that you should report to your doctor or health care professional as soon as possible: allergic reactions like skin rash, itching or hives, swelling of the face, lips, or tongue signs of infection - fever or chills, cough, sore throat, pain or difficulty passing urine signs of decreased platelets or bleeding - bruising, pinpoint red spots on the skin, black, tarry stools, nosebleeds signs of decreased red blood cells - unusually weak or tired, fainting spells, lightheadedness breathing problems changes in hearing changes in vision chest pain high blood pressure low blood counts - This drug may decrease the number of white blood cells, red blood cells and platelets. You may be at increased risk for infections and bleeding. nausea and vomiting pain, swelling, redness or irritation at the injection site pain, tingling, numbness in the hands or feet problems with balance, talking, walking trouble passing urine or change in the  amount of urine Side effects that usually do not require medical attention (report to your doctor or health care professional if they continue or are bothersome): hair loss loss of appetite metallic taste in the mouth or changes in taste This list may not describe all possible side effects. Call your doctor for medical advice about side effects. You may report side effects to FDA at 1-800-FDA-1088. Where should I keep my medication? This drug is given in a hospital or clinic and will not be stored at home. NOTE: This sheet is a summary. It may not cover all possible information. If you have questions about this medicine, talk to your doctor, pharmacist, or health care provider.  2022 Elsevier/Gold Standard (2007-09-10 14:38:05) Paclitaxel injection What is this medication? PACLITAXEL (PAK li TAX el) is a chemotherapy drug. It targets fast dividing cells, like cancer cells, and causes these cells to die. This medicine is used to treat ovarian cancer, breast cancer, lung cancer, Kaposi's sarcoma, and other cancers. This medicine may be used for other purposes; ask your health care provider or pharmacist if you have questions. COMMON BRAND NAME(S): Onxol, Taxol What should I tell my care team before I take this medication? They need to know if you have any of these conditions: history of irregular heartbeat liver disease low blood counts, like low white cell, platelet, or red cell counts lung or breathing disease, like asthma tingling of the fingers or toes, or other nerve disorder an unusual or  allergic reaction to paclitaxel, alcohol, polyoxyethylated castor oil, other chemotherapy, other medicines, foods, dyes, or preservatives pregnant or trying to get pregnant breast-feeding How should I use this medication? This drug is given as an infusion into a vein. It is administered in a hospital or clinic by a specially trained health care professional. Talk to your pediatrician regarding the  use of this medicine in children. Special care may be needed. Overdosage: If you think you have taken too much of this medicine contact a poison control center or emergency room at once. NOTE: This medicine is only for you. Do not share this medicine with others. What if I miss a dose? It is important not to miss your dose. Call your doctor or health care professional if you are unable to keep an appointment. What may interact with this medication? Do not take this medicine with any of the following medications: live virus vaccines This medicine may also interact with the following medications: antiviral medicines for hepatitis, HIV or AIDS certain antibiotics like erythromycin and clarithromycin certain medicines for fungal infections like ketoconazole and itraconazole certain medicines for seizures like carbamazepine, phenobarbital, phenytoin gemfibrozil nefazodone rifampin St. John's wort This list may not describe all possible interactions. Give your health care provider a list of all the medicines, herbs, non-prescription drugs, or dietary supplements you use. Also tell them if you smoke, drink alcohol, or use illegal drugs. Some items may interact with your medicine. What should I watch for while using this medication? Your condition will be monitored carefully while you are receiving this medicine. You will need important blood work done while you are taking this medicine. This medicine can cause serious allergic reactions. To reduce your risk you will need to take other medicine(s) before treatment with this medicine. If you experience allergic reactions like skin rash, itching or hives, swelling of the face, lips, or tongue, tell your doctor or health care professional right away. In some cases, you may be given additional medicines to help with side effects. Follow all directions for their use. This drug may make you feel generally unwell. This is not uncommon, as chemotherapy can  affect healthy cells as well as cancer cells. Report any side effects. Continue your course of treatment even though you feel ill unless your doctor tells you to stop. Call your doctor or health care professional for advice if you get a fever, chills or sore throat, or other symptoms of a cold or flu. Do not treat yourself. This drug decreases your body's ability to fight infections. Try to avoid being around people who are sick. This medicine may increase your risk to bruise or bleed. Call your doctor or health care professional if you notice any unusual bleeding. Be careful brushing and flossing your teeth or using a toothpick because you may get an infection or bleed more easily. If you have any dental work done, tell your dentist you are receiving this medicine. Avoid taking products that contain aspirin, acetaminophen, ibuprofen, naproxen, or ketoprofen unless instructed by your doctor. These medicines may hide a fever. Do not become pregnant while taking this medicine. Women should inform their doctor if they wish to become pregnant or think they might be pregnant. There is a potential for serious side effects to an unborn child. Talk to your health care professional or pharmacist for more information. Do not breast-feed an infant while taking this medicine. Men are advised not to father a child while receiving this medicine. This product may contain alcohol.  Ask your pharmacist or healthcare provider if this medicine contains alcohol. Be sure to tell all healthcare providers you are taking this medicine. Certain medicines, like metronidazole and disulfiram, can cause an unpleasant reaction when taken with alcohol. The reaction includes flushing, headache, nausea, vomiting, sweating, and increased thirst. The reaction can last from 30 minutes to several hours. What side effects may I notice from receiving this medication? Side effects that you should report to your doctor or health care professional  as soon as possible: allergic reactions like skin rash, itching or hives, swelling of the face, lips, or tongue breathing problems changes in vision fast, irregular heartbeat high or low blood pressure mouth sores pain, tingling, numbness in the hands or feet signs of decreased platelets or bleeding - bruising, pinpoint red spots on the skin, black, tarry stools, blood in the urine signs of decreased red blood cells - unusually weak or tired, feeling faint or lightheaded, falls signs of infection - fever or chills, cough, sore throat, pain or difficulty passing urine signs and symptoms of liver injury like dark yellow or brown urine; general ill feeling or flu-like symptoms; light-colored stools; loss of appetite; nausea; right upper belly pain; unusually weak or tired; yellowing of the eyes or skin swelling of the ankles, feet, hands unusually slow heartbeat Side effects that usually do not require medical attention (report to your doctor or health care professional if they continue or are bothersome): diarrhea hair loss loss of appetite muscle or joint pain nausea, vomiting pain, redness, or irritation at site where injected tiredness This list may not describe all possible side effects. Call your doctor for medical advice about side effects. You may report side effects to FDA at 1-800-FDA-1088. Where should I keep my medication? This drug is given in a hospital or clinic and will not be stored at home. NOTE: This sheet is a summary. It may not cover all possible information. If you have questions about this medicine, talk to your doctor, pharmacist, or health care provider.  2022 Elsevier/Gold Standard (2019-05-07 13:37:23)    To help prevent nausea and vomiting after your treatment, we encourage you to take your nausea medication as directed.  BELOW ARE SYMPTOMS THAT SHOULD BE REPORTED IMMEDIATELY: *FEVER GREATER THAN 100.4 F (38 C) OR HIGHER *CHILLS OR SWEATING *NAUSEA AND  VOMITING THAT IS NOT CONTROLLED WITH YOUR NAUSEA MEDICATION *UNUSUAL SHORTNESS OF BREATH *UNUSUAL BRUISING OR BLEEDING *URINARY PROBLEMS (pain or burning when urinating, or frequent urination) *BOWEL PROBLEMS (unusual diarrhea, constipation, pain near the anus) TENDERNESS IN MOUTH AND THROAT WITH OR WITHOUT PRESENCE OF ULCERS (sore throat, sores in mouth, or a toothache) UNUSUAL RASH, SWELLING OR PAIN  UNUSUAL VAGINAL DISCHARGE OR ITCHING   Items with * indicate a potential emergency and should be followed up as soon as possible or go to the Emergency Department if any problems should occur.  Please show the CHEMOTHERAPY ALERT CARD or IMMUNOTHERAPY ALERT CARD at check-in to the Emergency Department and triage nurse.  Should you have questions after your visit or need to cancel or reschedule your appointment, please contact [facility]  [phone] and follow the prompts.  Office hours are 8:00 a.m. to 4:30 p.m. Monday - Friday. Please note that voicemails left after 4:00 p.m. may not be returned until the following business day.  We are closed weekends and major holidays. You have access to a nurse at all times for urgent questions. Please call the main number to the clinic  [phone] and follow the prompts.  For any non-urgent questions, you may also contact your provider using MyChart. We now offer e-Visits for anyone 24 and older to request care online for non-urgent symptoms. For details visit [email or web address].   Also download the MyChart app! Go to the app store, search "MyChart", open the app, select [facility], and log in with your MyChart username and password.  Due to Covid, a mask is required upon entering the hospital/clinic. If you do not have a mask, one will be given to you upon arrival. For doctor visits, patients may have 1 support person aged 24 or older with them. For treatment visits, patients cannot have anyone with them due to  current Covid guidelines and our immunocompromised population.

## 2021-02-28 NOTE — Progress Notes (Signed)
Nutrition Assessment:  RD screen for weight loss.   65 year old female with stage II lung cancer.  Past medical history of DM, HTN, HLD, PAD, right lower extremity BID 2021, s/p left lower lobectomy on 01/17/21.  Patient starting adjuvant chemotherapy.    Met with patient during infusion.  Patient reports that appetite has been effected by constipation.  Reports took dulcolax and helped.  Has not started colace.  Patient reports yesterday was able to eat sausage and cheese sandwich for breakfast.  Then ate again around 3pm fried chicken wing, couple Tbsp of potato salad, pinto beans, cornbread.  Later in the evening ate a ham sandwich with mayo.  Reports that she lives with her aunt and she cooks daily.  Reports that she has tried ensure and glucerna shakes before and drinks them some but not every day.     Medications: colace, metformin, MVI, zofran, compazine, jardiance  Labs: glucose 189, BUN 34, creatinine 1.35  Anthropometrics:   Height: 65 inches Weight: 110 lb 6.4 oz today 121 lb 9.6 oz on 7/28 BMI: 18  9% weight loss in the last month, significant   Estimated Energy Needs  Kcals: 1500-1750 Protein: 75-88 g Fluid: 1.5 L  NUTRITION DIAGNOSIS: Inadequate oral intake related to cancer and cancer related treatment side effects (surgery, constipation) as evidenced by 9% weight loss in the last month.   INTERVENTION:  Samples given of boost plus and boost glucose control along with ensure plus and glucerna.  Coupons given as well.  Patient with [place] [employer] and could be eligible for coverage if meets criteria.  Patient to call RD on brand preference before making referral to [facility] Discussed ways to add calories and protein in diet. Discussed strategies to help with constipation.  Patient to pick up stool softner and start as prescribed by Dr [name].   Contact information provided     MONITORING, EVALUATION, GOAL: weight trends, intake   NEXT VISIT: Tuesday, October 4th phone  call  Natavia Sublette B. Zenia Resides, [place], [place] Registered Dietitian [phone] (mobile)

## 2021-02-28 NOTE — Progress Notes (Signed)
Pt tolerated chemotherapy infusion well with no signs of complications. Vitals stable throughout infusion. RN educated pt on the importance of notifying the clinic if any complications occur at home and when to seek emergency care. Pt verbalized understanding and all questions answered at this time. VSS. Pt stable for discharge.   Kendarius Vigen [employer]

## 2021-03-01 ENCOUNTER — Telehealth: Payer: Self-pay

## 2021-03-01 NOTE — Telephone Encounter (Signed)
Telephone call to patient for follow up after receiving first infusion.   No answer but left message stating we were calling to check on them.  Encouraged patient to call for any questions or concerns.   

## 2021-03-02 ENCOUNTER — Encounter: Payer: Self-pay | Admitting: *Deleted

## 2021-03-04 ENCOUNTER — Other Ambulatory Visit: Payer: Self-pay | Admitting: Family Medicine

## 2021-03-04 ENCOUNTER — Telehealth: Payer: Self-pay

## 2021-03-04 DIAGNOSIS — G893 Neoplasm related pain (acute) (chronic): Secondary | ICD-10-CM

## 2021-03-04 NOTE — Telephone Encounter (Signed)
Copied from [facility] [id number]. Topic: General - Other >> Mar 04, 2021 12:27 PM Wynetta Emery, [place] C wrote: Reason for CRM: pt called in to be advised. Pt says that she was prescribed a stool softener by a different provider. Pt says that she has been taking Dulculax and it has been helping her. Pt would like to know if she should still take her stool softener that she was prescribed?

## 2021-03-04 NOTE — Telephone Encounter (Signed)
Copied from [facility] [id number]. Topic: General - Other >> Mar 04, 2021 11:44 AM Antonieta Iba C wrote: Reason for CRM: pt says that her insurance has changed to Medicaid. Pt says that she is no longer able to afford 2 of her medications, pt is checking with her pharmacy for pricing on the others. Pt would like to know if provider is able to prescribe something different?   isosorbide dinitrate (ISORDIL) 30 MG tablet DULoxetine (CYMBALTA) 20 MG capsule

## 2021-03-07 ENCOUNTER — Other Ambulatory Visit: Payer: Self-pay

## 2021-03-07 ENCOUNTER — Inpatient Hospital Stay (HOSPITAL_BASED_OUTPATIENT_CLINIC_OR_DEPARTMENT_OTHER): Payer: Medicaid Other | Admitting: Oncology

## 2021-03-07 ENCOUNTER — Inpatient Hospital Stay: Payer: Medicaid Other

## 2021-03-07 ENCOUNTER — Encounter: Payer: Self-pay | Admitting: Oncology

## 2021-03-07 VITALS — BP 122/87 | HR 90 | Temp 97.9°F | Resp 17 | Wt 118.0 lb

## 2021-03-07 DIAGNOSIS — D649 Anemia, unspecified: Secondary | ICD-10-CM

## 2021-03-07 DIAGNOSIS — C3492 Malignant neoplasm of unspecified part of left bronchus or lung: Secondary | ICD-10-CM

## 2021-03-07 DIAGNOSIS — N1832 Chronic kidney disease, stage 3b: Secondary | ICD-10-CM | POA: Diagnosis not present

## 2021-03-07 DIAGNOSIS — E860 Dehydration: Secondary | ICD-10-CM

## 2021-03-07 DIAGNOSIS — Z5111 Encounter for antineoplastic chemotherapy: Secondary | ICD-10-CM | POA: Diagnosis not present

## 2021-03-07 LAB — COMPREHENSIVE METABOLIC PANEL
ALT: 26 U/L (ref 0–44)
AST: 33 U/L (ref 15–41)
Albumin: 3.8 g/dL (ref 3.5–5.0)
Alkaline Phosphatase: 129 U/L — ABNORMAL HIGH (ref 38–126)
Anion gap: 11 (ref 5–15)
BUN: 45 mg/dL — ABNORMAL HIGH (ref 8–23)
CO2: 22 mmol/L (ref 22–32)
Calcium: 9.2 mg/dL (ref 8.9–10.3)
Chloride: 103 mmol/L (ref 98–111)
Creatinine, Ser: 1.48 mg/dL — ABNORMAL HIGH (ref 0.44–1.00)
GFR, Estimated: 40 mL/min — ABNORMAL LOW (ref >60)
Glucose, Bld: 208 mg/dL — ABNORMAL HIGH (ref 70–99)
Potassium: 5.2 mmol/L — ABNORMAL HIGH (ref 3.5–5.1)
Sodium: 136 mmol/L (ref 135–145)
Total Bilirubin: 0.5 mg/dL (ref 0.3–1.2)
Total Protein: 7.3 g/dL (ref 6.5–8.1)

## 2021-03-07 LAB — CBC WITH DIFFERENTIAL/PLATELET
Abs Immature Granulocytes: 0.02 10*3/uL (ref 0.00–0.07)
Basophils Absolute: 0 10*3/uL (ref 0.0–0.1)
Basophils Relative: 0 %
Eosinophils Absolute: 0.2 10*3/uL (ref 0.0–0.5)
Eosinophils Relative: 7 %
HCT: 35.7 % — ABNORMAL LOW (ref 36.0–46.0)
Hemoglobin: 11.2 g/dL — ABNORMAL LOW (ref 12.0–15.0)
Immature Granulocytes: 1 %
Lymphocytes Relative: 32 %
Lymphs Abs: 0.9 10*3/uL (ref 0.7–4.0)
MCH: 28.6 pg (ref 26.0–34.0)
MCHC: 31.4 g/dL (ref 30.0–36.0)
MCV: 91.1 fL (ref 80.0–100.0)
Monocytes Absolute: 0.2 10*3/uL (ref 0.1–1.0)
Monocytes Relative: 5 %
Neutro Abs: 1.6 10*3/uL — ABNORMAL LOW (ref 1.7–7.7)
Neutrophils Relative %: 55 %
Platelets: 402 10*3/uL — ABNORMAL HIGH (ref 150–400)
RBC: 3.92 MIL/uL (ref 3.87–5.11)
RDW: 14.2 % (ref 11.5–15.5)
WBC: 2.9 10*3/uL — ABNORMAL LOW (ref 4.0–10.5)
nRBC: 0 % (ref 0.0–0.2)

## 2021-03-07 MED ORDER — SODIUM CHLORIDE 0.9 % IV SOLN
Freq: Once | INTRAVENOUS | Status: DC
  Filled 2021-03-07: qty 250

## 2021-03-07 MED ORDER — SODIUM CHLORIDE 0.9 % IV SOLN
Freq: Once | INTRAVENOUS | Status: AC
  Filled 2021-03-07: qty 250

## 2021-03-07 NOTE — Progress Notes (Signed)
IV hydration 500 ml NS over 1 hour this afternoon for elevated BUN and creatinine. Discharged to home.

## 2021-03-07 NOTE — Progress Notes (Signed)
Patient here for oncology follow-up appointment, expresses no complaints or concerns at this time.    

## 2021-03-07 NOTE — Progress Notes (Signed)
Hematology/Oncology progress note [facility] Telephone:([phone] [phone]   Patient Care Team: [name] Patch, MD as PCP - General (Family Medicine)  REFERRING PROVIDER: [name], MD  CHIEF COMPLAINTS/REASON FOR VISIT:  Follow up for stage IIIA lung cancer  HISTORY OF PRESENTING ILLNESS:  Patient was recently seen by urology Dr. Candiss Norse [name] for evaluation of microscopic hematuria, frequent UTI. 11/17/2020, CT hematuria work-up was obtained which showed no evidence of urinary tract calculus or hydronephrosis.  Small left renal cyst. Incidental findings of 1.9 x 1.2 cm left lower lobe pulmonary nodule worrisome for malignancy. Patient was referred to establish care with oncology for further evaluation  .  Patient denies any shortness of breath, cough, hemoptysis, unintentional weight loss, fever or night sweats. She currently smokes 1 cigar/day.  She started smoking cigarettes at age of 64.  She quitted at age of 60 and restarted smoking cigar a few years ago.  Patient has a history of depression and is on Cymbalta.  Diabetes, hyperlipidemia, hypertension, peripheral artery disease Per patient she also has history of heart attack in 2011. History of right lower extremity BKA in 2021, 02/15/2020 history of pulmonary embolism involving segmental and subsegmental branches of right upper, middle, lower lobe pulmonary arteries.  She was put on on apixaban. Patient lives with her aunt.  12/07/2020, PET scan showed left lower lobe 1.3 x 1.8 cm lung nodule with SUV of 6.5.  Suspect early stage primary lung neoplasm.  No hilar or mediastinal lymphadenopathy activity.  # 12/23/2020 S/p CT guided biopsy of left lower lobe mass.  Pathology showed non-small cell carcinoma, favor adenocarcinoma.  Positive for TTF-1, negative for p40.     # 01/17/2021, patient underwent left lower lobectomy with lymph node dissection. Pathology showed invasive poorly  differentiated adenocarcinoma, 2.2 cm.  Tumor abuts pleura but visceral pleural surface is not involved by carcinoma.  LVI invasion is present, resection margins are negative for carcinoma.  10 lymph nodes were harvested and 3 lymph nodes were involved with carcinoma. pT1c pN2 Patient's case was discussed at [facility] oncology tumor board.  And consensus recommendation was adjuvant chemotherapy and sent tissue for molecular/PD-L1 testing.  Foundation one testing showed PD-L1 1,  No reportable alterations ERBB2 [id number]  # 02/28/2021 adjuvant carboplatin and  INTERVAL HISTORY [name] [name] is a 65 y.o. female who has above history reviewed by me today presents for follow up visit for Stage IIIA non small cell lung cancer She reports feeling well. Tolerates treatment well. No nausea vomiting diarrhea.   Review of Systems  Constitutional:  Negative for appetite change, chills, fatigue and fever.  HENT:   Negative for hearing loss and voice change.   Eyes:  Negative for eye problems.  Respiratory:  Negative for chest tightness and cough.   Cardiovascular:  Negative for chest pain.  Gastrointestinal:  Negative for abdominal distention, abdominal pain and blood in stool.  Endocrine: Negative for hot flashes.  Genitourinary:  Negative for difficulty urinating and frequency.   Musculoskeletal:  Negative for arthralgias.  Skin:  Negative for itching and rash.  Neurological:  Negative for extremity weakness.  Hematological:  Negative for adenopathy.  Psychiatric/Behavioral:  Negative for confusion.    MEDICAL HISTORY:  Past Medical History:  Diagnosis Date   Anemia    Cancer ([place])    CHF (congestive heart failure) ([place])    02/16/20 HFrEF 20-25% in setting of acute DVT/PE, underlying CAD   CKD (chronic kidney disease)  Coronary artery disease    02/20/20: CTO pRCA with left-to-right collaterals, 50% mLAD, 80% OM1, medical therapy   Depression    Diabetes ([place])    Diabetes mellitus without  complication ([place])    DVT (deep venous thrombosis) ([place]) 02/16/2020   RLE DVT proximal veins 02/16/20 Duplex   Hyperlipidemia    Hypertension    Myocardial infarction [facility]) 2009   Stress related per patient; NSTEMI 2012 100% RCA with left-to-right collaterals   PE (pulmonary thromboembolism) ([place]) 02/15/2020   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, RLE BKA   Peripheral artery disease ([place])     SURGICAL HISTORY: Past Surgical History:  Procedure Laterality Date   Right lower extremity BKA Right     SOCIAL HISTORY: Social History   Socioeconomic History   Marital status: Married    Spouse name: Not on file   Number of children: Not on file   Years of education: Not on file   Highest education level: Not on file  Occupational History   Not on file  Tobacco Use   Smoking status: Former    Packs/day: 0.25    Types: Cigars, Cigarettes    Quit date: 01/17/2021    Years since quitting: 0.1   Smokeless tobacco: Never  Vaping Use   Vaping Use: Never used  Substance and Sexual Activity   Alcohol use: Never   Drug use: Not Currently   Sexual activity: Not Currently  Other Topics Concern   Not on file  Social History Narrative   Not on file   Social Determinants of Health   Financial Resource Strain: Not on file  Food Insecurity: Not on file  Transportation Needs: Not on file  Physical Activity: Not on file  Stress: Not on file  Social Connections: Not on file  Intimate Partner Violence: Not on file    FAMILY HISTORY: Family History  Problem Relation Age of Onset   Heart disease Mother    Diabetes Mother    Heart disease Father    Diabetes Father     ALLERGIES:  is allergic to lisinopril.  MEDICATIONS:  Current Outpatient Medications  Medication Sig Dispense Refill   ACCU-CHEK GUIDE test strip USE UP TP 4 TIMES A DAY AS DIRECTED 100 strip 1   Accu-Chek Softclix Lancets lancets SMARTSIG:Topical 1-4 Times Daily     aspirin EC 81 MG tablet Take 81 mg  by mouth in the morning. Swallow whole.     atorvastatin (LIPITOR) 80 MG tablet Take 1 tablet (80 mg total) by mouth daily. 90 tablet 1   blood glucose meter kit and supplies KIT Dispense based on patient and insurance preference. Use up to four times daily as directed. 1 each 0   carboxymethylcellulose (REFRESH PLUS) 0.5 % SOLN Place 1 drop into both eyes in the morning and at bedtime.     diphenhydrAMINE (BENADRYL) 25 MG tablet Take 25 mg by mouth at bedtime.     docusate sodium (COLACE) 100 MG capsule Take 1 capsule (100 mg total) by mouth 2 (two) times daily. 60 capsule 1   DULoxetine (CYMBALTA) 20 MG capsule TAKE 2 CAPSULES (40 MG TOTAL) BY MOUTH IN THE MORNING AND AT BEDTIME. 180 capsule 2   gabapentin (NEURONTIN) 100 MG capsule Take 1 capsule (100 mg total) by mouth 2 (two) times daily. 60 capsule 5   HUMALOG KWIKPEN 100 UNIT/ML KwikPen Inject 8 Units into the skin 3 (three) times daily after meals.     hydrALAZINE (  APRESOLINE) 100 MG tablet Take 1 tablet (100 mg total) by mouth 3 (three) times daily. 270 tablet 0   isosorbide dinitrate (ISORDIL) 30 MG tablet TAKE 1 TABLET (30 MG TOTAL) BY MOUTH IN THE MORNING, AT NOON, AND AT BEDTIME. 90 tablet 0   JARDIANCE 10 MG TABS tablet TAKE 1 TABLET BY MOUTH EVERY DAY 90 tablet 0   ketoconazole (NIZORAL) 2 % cream Apply 1 application topically in the morning and at bedtime. Applied to feet 60 g 1   LANTUS SOLOSTAR 100 UNIT/ML Solostar Pen 16 Units at bedtime.     metFORMIN (GLUCOPHAGE-XR) 500 MG 24 hr tablet TAKE 1 TABLET BY MOUTH TWICE A DAY 180 tablet 0   metoprolol succinate (TOPROL-XL) 50 MG 24 hr tablet Take 1 tablet (50 mg total) by mouth daily. Take with or immediately following a meal. 90 tablet 1   Multiple Vitamin (MULTIVITAMIN WITH MINERALS) TABS tablet Take 1 tablet by mouth in the morning. Adults 50+     spironolactone (ALDACTONE) 25 MG tablet TAKE 1 TABLET (25 MG TOTAL) BY MOUTH DAILY. 90 tablet 0   ondansetron (ZOFRAN) 8 MG tablet Take  1 tablet (8 mg total) by mouth 2 (two) times daily as needed (Nausea or vomiting). Start if needed on the third day after cisplatin. (Patient not taking: Reported on 03/07/2021) 30 tablet 1   prochlorperazine (COMPAZINE) 10 MG tablet Take 1 tablet (10 mg total) by mouth every 6 (six) hours as needed (Nausea or vomiting). (Patient not taking: Reported on 03/07/2021) 30 tablet 1   No current facility-administered medications for this visit.   Facility-Administered Medications Ordered in Other Visits  Medication Dose Route Frequency Provider Last Rate Last Admin   0.9 %  sodium chloride infusion   Intravenous Once Earlie Server, MD         PHYSICAL EXAMINATION: ECOG PERFORMANCE STATUS: 1 - Symptomatic but completely ambulatory Vitals:   03/07/21 1405  BP: 122/87  Pulse: 90  Resp: 17  Temp: 97.9 F (36.6 C)  SpO2: 100%   Filed Weights   03/07/21 1405  Weight: 118 lb (53.5 kg)    Physical Exam Constitutional:      General: She is not in acute distress. HENT:     Head: Normocephalic and atraumatic.  Eyes:     General: No scleral icterus. Cardiovascular:     Rate and Rhythm: Normal rate and regular rhythm.     Heart sounds: Normal heart sounds.  Pulmonary:     Effort: Pulmonary effort is normal. No respiratory distress.     Breath sounds: No wheezing.  Abdominal:     General: Bowel sounds are normal. There is no distension.     Palpations: Abdomen is soft.  Musculoskeletal:        General: No deformity. Normal range of motion.     Cervical back: Normal range of motion and neck supple.     Comments: Right lower extremity BKA  Skin:    General: Skin is warm and dry.     Findings: No erythema or rash.  Neurological:     Mental Status: She is alert and oriented to person, place, and time. Mental status is at baseline.     Cranial Nerves: No cranial nerve deficit.     Coordination: Coordination normal.  Psychiatric:        Mood and Affect: Mood normal.    LABORATORY DATA:  I  have reviewed the data as listed Lab Results  Component Value Date  WBC 2.9 (L) 03/07/2021   HGB 11.2 (L) 03/07/2021   HCT 35.7 (L) 03/07/2021   MCV 91.1 03/07/2021   PLT 402 (H) 03/07/2021   Recent Labs    08/10/20 1658 11/17/20 1149 02/17/21 1108 02/28/21 0820 03/07/21 1340  NA 136   < > 140 140 136  K 4.6   < > 3.3* 4.0 5.2*  CL 101   < > 102 106 103  CO2 17*   < > _0 GLUCOSE 485*   < > 85 189* 208*  BUN 39*   < > 21 34* 45*  CREATININE 1.18*   < > 1.32* 1.35* 1.48*  CALCIUM 9.2   < > 8.9 8.9 9.2  GFRNONAA 50*   < > 46* 44* 40*  GFRAA 57*  --   --   --   --   PROT  --    < > 6.6 6.3* 7.3  ALBUMIN 3.2*   < > 3.5 3.3* 3.8  AST  --    < > 32 33 33  ALT  --    < > _1 ALKPHOS  --    < > 136* 118 129*  BILITOT  --    < > 0.3 0.4 0.5   < > = values in this interval not displayed.    Iron/TIBC/Ferritin/ %Sat    Component Value Date/Time   IRON 44 12/30/2020 1437   TIBC 347 12/30/2020 1437   FERRITIN 15 12/30/2020 1437   IRONPCTSAT 13 12/30/2020 1437      RADIOGRAPHIC STUDIES: I have personally reviewed the radiological images as listed and agreed with the findings in the report. DG Chest 2 View  Result Date: 02/09/2021 CLINICAL DATA:  Non-small cell lung cancer status post left lower lobectomy EXAM: CHEST - 2 VIEW COMPARISON:  01/22/2021 FINDINGS: Frontal and lateral views of the chest demonstrate elevation of the left hemidiaphragm consistent with partial left pneumonectomy. No acute airspace disease or pneumothorax. Trace left pleural effusion versus pleural thickening again noted. Right chest is clear. There are no acute bony abnormalities. IMPRESSION: 1. Trace residual left pleural effusion versus pleural thickening. 2. Resolution of the left-sided pneumothorax seen previously. 3. Elevated left hemidiaphragm. Electronically Signed   By: Randa Ngo M.D.   On: 02/09/2021 15:26      ASSESSMENT & PLAN:  1. Non-small cell cancer of left lung (HCC)    2. Stage 3b chronic kidney disease ([place])   3. Normocytic anemia   Cancer Staging Non-small cell lung cancer [facility]) Staging form: Lung, AJCC 8th Edition - Pathologic stage from 01/28/2021: Stage IIIA (pT1c, pN2, cM0) - Signed by Earlie Server, MD on 02/17/2021  #Stage IIIA left lung non-small cell lung cancer-.  Poorly differentiated adenocarcinoma.  Status post left lower lobectomy and lymph node dissection pT1c pN2 [pathology addendum changed to N2]  Tolerate  cycle 1 carboplatin AUC of 5, Taxol 200 mg/m2  # Chemotherapy induced neutropenia, ANC is 1.6, monitor. Repeat cbc in 1 week # CKD, creatinine is slightly worse than her baseline. IVF 1L NS today.  # Thrombocytosis, likely reactive.  # Anemia, stable hb #Tobacco use, continue smoke cessation efforts.Marland Kitchen #Normocytic anemia, probably secondary to chronic kidney disease.    All questions were answered. The patient knows to call the clinic with any problems questions or concerns.  cc [name] Patch, MD   Return of visit: 1 week lab Lab MD 2weeks with carboplatin and Taxol.    Talbert Cage  [name], MD, PhD Hematology [place] [facility]  03/07/2021

## 2021-03-09 ENCOUNTER — Telehealth: Payer: Self-pay

## 2021-03-09 NOTE — Telephone Encounter (Signed)
I tried to call the transportation services- I held for 10 minutes- pt DOES have appt on the 26th at [place] [facility] for labs

## 2021-03-09 NOTE — Telephone Encounter (Signed)
Copied from [place] [id number]. Topic: General - Other >> Mar 08, 2021  4:41 PM Bayard Beaver wrote: Reason for [id number] from transportation dept called to confirm if patientt has an appt from a referral on 9/26. He isnt sure what its for. I dont see anything about this. Please call back

## 2021-03-11 ENCOUNTER — Other Ambulatory Visit: Payer: Self-pay

## 2021-03-11 DIAGNOSIS — D649 Anemia, unspecified: Secondary | ICD-10-CM

## 2021-03-14 ENCOUNTER — Inpatient Hospital Stay: Payer: Medicaid Other

## 2021-03-14 DIAGNOSIS — C3492 Malignant neoplasm of unspecified part of left bronchus or lung: Secondary | ICD-10-CM

## 2021-03-14 DIAGNOSIS — Z5111 Encounter for antineoplastic chemotherapy: Secondary | ICD-10-CM | POA: Diagnosis not present

## 2021-03-14 DIAGNOSIS — D649 Anemia, unspecified: Secondary | ICD-10-CM

## 2021-03-14 LAB — BASIC METABOLIC PANEL
Anion gap: 9 (ref 5–15)
BUN: 47 mg/dL — ABNORMAL HIGH (ref 8–23)
CO2: 24 mmol/L (ref 22–32)
Calcium: 8.8 mg/dL — ABNORMAL LOW (ref 8.9–10.3)
Chloride: 104 mmol/L (ref 98–111)
Creatinine, Ser: 1.48 mg/dL — ABNORMAL HIGH (ref 0.44–1.00)
GFR, Estimated: 40 mL/min — ABNORMAL LOW (ref >60)
Glucose, Bld: 149 mg/dL — ABNORMAL HIGH (ref 70–99)
Potassium: 4.2 mmol/L (ref 3.5–5.1)
Sodium: 137 mmol/L (ref 135–145)

## 2021-03-14 LAB — CBC WITH DIFFERENTIAL/PLATELET
Abs Immature Granulocytes: 0.01 10*3/uL (ref 0.00–0.07)
Basophils Absolute: 0 10*3/uL (ref 0.0–0.1)
Basophils Relative: 1 %
Eosinophils Absolute: 0.7 10*3/uL — ABNORMAL HIGH (ref 0.0–0.5)
Eosinophils Relative: 18 %
HCT: 32.6 % — ABNORMAL LOW (ref 36.0–46.0)
Hemoglobin: 10.1 g/dL — ABNORMAL LOW (ref 12.0–15.0)
Immature Granulocytes: 0 %
Lymphocytes Relative: 35 %
Lymphs Abs: 1.3 10*3/uL (ref 0.7–4.0)
MCH: 28.8 pg (ref 26.0–34.0)
MCHC: 31 g/dL (ref 30.0–36.0)
MCV: 92.9 fL (ref 80.0–100.0)
Monocytes Absolute: 0.5 10*3/uL (ref 0.1–1.0)
Monocytes Relative: 14 %
Neutro Abs: 1.2 10*3/uL — ABNORMAL LOW (ref 1.7–7.7)
Neutrophils Relative %: 32 %
Platelets: 296 10*3/uL (ref 150–400)
RBC: 3.51 MIL/uL — ABNORMAL LOW (ref 3.87–5.11)
RDW: 15 % (ref 11.5–15.5)
WBC: 3.8 10*3/uL — ABNORMAL LOW (ref 4.0–10.5)
nRBC: 0 % (ref 0.0–0.2)

## 2021-03-21 ENCOUNTER — Ambulatory Visit: Payer: Medicaid Other | Admitting: Oncology

## 2021-03-21 ENCOUNTER — Ambulatory Visit: Payer: Medicaid Other

## 2021-03-21 ENCOUNTER — Other Ambulatory Visit: Payer: Medicaid Other

## 2021-03-22 ENCOUNTER — Encounter: Payer: Self-pay | Admitting: Oncology

## 2021-03-22 ENCOUNTER — Inpatient Hospital Stay: Payer: Medicaid Other

## 2021-03-22 ENCOUNTER — Inpatient Hospital Stay (HOSPITAL_BASED_OUTPATIENT_CLINIC_OR_DEPARTMENT_OTHER): Payer: Medicaid Other | Admitting: Oncology

## 2021-03-22 ENCOUNTER — Other Ambulatory Visit: Payer: Self-pay

## 2021-03-22 ENCOUNTER — Inpatient Hospital Stay: Payer: Medicaid Other | Attending: Oncology

## 2021-03-22 VITALS — BP 116/71 | HR 89 | Temp 98.0°F | Resp 18 | Wt 116.5 lb

## 2021-03-22 DIAGNOSIS — Z902 Acquired absence of lung [part of]: Secondary | ICD-10-CM | POA: Insufficient documentation

## 2021-03-22 DIAGNOSIS — E785 Hyperlipidemia, unspecified: Secondary | ICD-10-CM | POA: Insufficient documentation

## 2021-03-22 DIAGNOSIS — R110 Nausea: Secondary | ICD-10-CM

## 2021-03-22 DIAGNOSIS — C3492 Malignant neoplasm of unspecified part of left bronchus or lung: Secondary | ICD-10-CM

## 2021-03-22 DIAGNOSIS — N1832 Chronic kidney disease, stage 3b: Secondary | ICD-10-CM | POA: Diagnosis not present

## 2021-03-22 DIAGNOSIS — R3129 Other microscopic hematuria: Secondary | ICD-10-CM | POA: Insufficient documentation

## 2021-03-22 DIAGNOSIS — I252 Old myocardial infarction: Secondary | ICD-10-CM | POA: Diagnosis not present

## 2021-03-22 DIAGNOSIS — R5383 Other fatigue: Secondary | ICD-10-CM | POA: Diagnosis not present

## 2021-03-22 DIAGNOSIS — F32A Depression, unspecified: Secondary | ICD-10-CM | POA: Insufficient documentation

## 2021-03-22 DIAGNOSIS — D75839 Thrombocytosis, unspecified: Secondary | ICD-10-CM | POA: Insufficient documentation

## 2021-03-22 DIAGNOSIS — Z86718 Personal history of other venous thrombosis and embolism: Secondary | ICD-10-CM | POA: Insufficient documentation

## 2021-03-22 DIAGNOSIS — C3432 Malignant neoplasm of lower lobe, left bronchus or lung: Secondary | ICD-10-CM | POA: Insufficient documentation

## 2021-03-22 DIAGNOSIS — Z87440 Personal history of urinary (tract) infections: Secondary | ICD-10-CM | POA: Insufficient documentation

## 2021-03-22 DIAGNOSIS — E1122 Type 2 diabetes mellitus with diabetic chronic kidney disease: Secondary | ICD-10-CM | POA: Insufficient documentation

## 2021-03-22 DIAGNOSIS — Z23 Encounter for immunization: Secondary | ICD-10-CM | POA: Diagnosis not present

## 2021-03-22 DIAGNOSIS — Z833 Family history of diabetes mellitus: Secondary | ICD-10-CM | POA: Insufficient documentation

## 2021-03-22 DIAGNOSIS — Z86711 Personal history of pulmonary embolism: Secondary | ICD-10-CM | POA: Diagnosis not present

## 2021-03-22 DIAGNOSIS — E440 Moderate protein-calorie malnutrition: Secondary | ICD-10-CM | POA: Diagnosis not present

## 2021-03-22 DIAGNOSIS — Z87891 Personal history of nicotine dependence: Secondary | ICD-10-CM | POA: Insufficient documentation

## 2021-03-22 DIAGNOSIS — D649 Anemia, unspecified: Secondary | ICD-10-CM | POA: Diagnosis not present

## 2021-03-22 DIAGNOSIS — Z5111 Encounter for antineoplastic chemotherapy: Secondary | ICD-10-CM

## 2021-03-22 DIAGNOSIS — Z888 Allergy status to other drugs, medicaments and biological substances status: Secondary | ICD-10-CM | POA: Insufficient documentation

## 2021-03-22 DIAGNOSIS — I739 Peripheral vascular disease, unspecified: Secondary | ICD-10-CM | POA: Insufficient documentation

## 2021-03-22 DIAGNOSIS — I130 Hypertensive heart and chronic kidney disease with heart failure and stage 1 through stage 4 chronic kidney disease, or unspecified chronic kidney disease: Secondary | ICD-10-CM | POA: Insufficient documentation

## 2021-03-22 DIAGNOSIS — I509 Heart failure, unspecified: Secondary | ICD-10-CM | POA: Diagnosis not present

## 2021-03-22 DIAGNOSIS — Z89511 Acquired absence of right leg below knee: Secondary | ICD-10-CM | POA: Diagnosis not present

## 2021-03-22 DIAGNOSIS — N281 Cyst of kidney, acquired: Secondary | ICD-10-CM | POA: Diagnosis not present

## 2021-03-22 DIAGNOSIS — Z79899 Other long term (current) drug therapy: Secondary | ICD-10-CM | POA: Diagnosis not present

## 2021-03-22 DIAGNOSIS — Z8249 Family history of ischemic heart disease and other diseases of the circulatory system: Secondary | ICD-10-CM | POA: Insufficient documentation

## 2021-03-22 LAB — CBC WITH DIFFERENTIAL/PLATELET
Abs Immature Granulocytes: 0.03 10*3/uL (ref 0.00–0.07)
Basophils Absolute: 0 10*3/uL (ref 0.0–0.1)
Basophils Relative: 1 %
Eosinophils Absolute: 0.5 10*3/uL (ref 0.0–0.5)
Eosinophils Relative: 9 %
HCT: 33.5 % — ABNORMAL LOW (ref 36.0–46.0)
Hemoglobin: 10.4 g/dL — ABNORMAL LOW (ref 12.0–15.0)
Immature Granulocytes: 1 %
Lymphocytes Relative: 22 %
Lymphs Abs: 1.2 10*3/uL (ref 0.7–4.0)
MCH: 29 pg (ref 26.0–34.0)
MCHC: 31 g/dL (ref 30.0–36.0)
MCV: 93.3 fL (ref 80.0–100.0)
Monocytes Absolute: 0.6 10*3/uL (ref 0.1–1.0)
Monocytes Relative: 11 %
Neutro Abs: 3 10*3/uL (ref 1.7–7.7)
Neutrophils Relative %: 56 %
Platelets: 295 10*3/uL (ref 150–400)
RBC: 3.59 MIL/uL — ABNORMAL LOW (ref 3.87–5.11)
RDW: 15.1 % (ref 11.5–15.5)
WBC: 5.3 10*3/uL (ref 4.0–10.5)
nRBC: 0 % (ref 0.0–0.2)

## 2021-03-22 LAB — COMPREHENSIVE METABOLIC PANEL
ALT: 26 U/L (ref 0–44)
AST: 32 U/L (ref 15–41)
Albumin: 3.7 g/dL (ref 3.5–5.0)
Alkaline Phosphatase: 113 U/L (ref 38–126)
Anion gap: 9 (ref 5–15)
BUN: 69 mg/dL — ABNORMAL HIGH (ref 8–23)
CO2: 23 mmol/L (ref 22–32)
Calcium: 8.9 mg/dL (ref 8.9–10.3)
Chloride: 106 mmol/L (ref 98–111)
Creatinine, Ser: 1.82 mg/dL — ABNORMAL HIGH (ref 0.44–1.00)
GFR, Estimated: 31 mL/min — ABNORMAL LOW (ref >60)
Glucose, Bld: 91 mg/dL (ref 70–99)
Potassium: 4.2 mmol/L (ref 3.5–5.1)
Sodium: 138 mmol/L (ref 135–145)
Total Bilirubin: 0.5 mg/dL (ref 0.3–1.2)
Total Protein: 7.1 g/dL (ref 6.5–8.1)

## 2021-03-22 MED ORDER — SODIUM CHLORIDE 0.9 % IV SOLN
Freq: Once | INTRAVENOUS | Status: AC
Start: 2021-03-22 — End: 2021-03-22
  Filled 2021-03-22: qty 250

## 2021-03-22 MED ORDER — SODIUM CHLORIDE 0.9 % IV SOLN
200.0000 mg/m2 | Freq: Once | INTRAVENOUS | Status: AC
  Administered 2021-03-22: 306 mg via INTRAVENOUS
  Filled 2021-03-22: qty 51

## 2021-03-22 MED ORDER — HEPARIN SOD (PORK) LOCK FLUSH 100 UNIT/ML IV SOLN
500.0000 [IU] | Freq: Once | INTRAVENOUS | Status: DC | PRN
  Filled 2021-03-22: qty 5

## 2021-03-22 MED ORDER — PALONOSETRON HCL INJECTION 0.25 MG/5ML
0.2500 mg | Freq: Once | INTRAVENOUS | Status: AC
  Administered 2021-03-22: 0.25 mg via INTRAVENOUS
  Filled 2021-03-22: qty 5

## 2021-03-22 MED ORDER — SODIUM CHLORIDE 0.9 % IV SOLN
Freq: Once | INTRAVENOUS | Status: AC
  Filled 2021-03-22: qty 250

## 2021-03-22 MED ORDER — SODIUM CHLORIDE 0.9 % IV SOLN
10.0000 mg | Freq: Once | INTRAVENOUS | Status: AC
  Administered 2021-03-22: 10 mg via INTRAVENOUS
  Filled 2021-03-22: qty 10

## 2021-03-22 MED ORDER — FAMOTIDINE 20 MG IN NS 100 ML IVPB
20.0000 mg | Freq: Once | INTRAVENOUS | Status: AC
  Administered 2021-03-22: 20 mg via INTRAVENOUS
  Filled 2021-03-22: qty 20

## 2021-03-22 MED ORDER — SODIUM CHLORIDE 0.9 % IV SOLN
252.0000 mg | Freq: Once | INTRAVENOUS | Status: AC
  Administered 2021-03-22: 250 mg via INTRAVENOUS
  Filled 2021-03-22: qty 25

## 2021-03-22 MED ORDER — PROCHLORPERAZINE EDISYLATE 10 MG/2ML IJ SOLN
10.0000 mg | Freq: Once | INTRAMUSCULAR | Status: AC
  Administered 2021-03-22: 10 mg via INTRAVENOUS

## 2021-03-22 MED ORDER — DIPHENHYDRAMINE HCL 50 MG/ML IJ SOLN
50.0000 mg | Freq: Once | INTRAMUSCULAR | Status: AC
  Administered 2021-03-22: 50 mg via INTRAVENOUS
  Filled 2021-03-22: qty 1

## 2021-03-22 MED ORDER — SODIUM CHLORIDE 0.9 % IV SOLN
150.0000 mg | Freq: Once | INTRAVENOUS | Status: AC
  Administered 2021-03-22: 150 mg via INTRAVENOUS
  Filled 2021-03-22: qty 150

## 2021-03-22 NOTE — Progress Notes (Signed)
Nutrition Follow-up:   Patient with stage II lung cancer.  Patient receiving adjuvant chemotherapy.    Met with patient during infusion.  Patient reports that her appetite is up and down.  Liked all of the sample shakes that RD gave to her on last visit and would like to get some.  Says that yesterday ate egg sandwich and cheese and crackers and drank a shake.  Denies nausea or constipation or diarrhea.      Medications: reviewed  Labs: glucose 91, BUN 68, creatinine 1.82  Anthropometrics:   Weight 116 lb 8 oz today  110 lb 6.4 oz on 9/12   NUTRITION DIAGNOSIS: Inadequate oral intake continues   INTERVENTION:  Patient interested in having authorization sent to [place] for possible approval for [name] Medicaid to cover oral nutrition supplement cost.  Will contact nursing to provide documentation Recommend boost glucose control or glucerna(chocolate) TID Encouraged patient to continue with high calorie, high protein foods    MONITORING, EVALUATION, GOAL: weight trends, intake   NEXT VISIT: Tuesday, Oct 15 during infusion  Charmane Protzman B. Zenia Resides, [place], [place] Registered Dietitian [phone] (mobile)

## 2021-03-22 NOTE — Progress Notes (Signed)
Cr 1.82 ok to proceed

## 2021-03-22 NOTE — Progress Notes (Signed)
Hematology/Oncology progress note [facility] Telephone:([phone] [phone]   Patient Care Team: [name], MD as PCP - General (Family Medicine)  REFERRING PROVIDER: [name], MD  CHIEF COMPLAINTS/REASON FOR VISIT:  Follow up for stage IIIA lung cancer  HISTORY OF PRESENTING ILLNESS:  Patient was recently seen by urology Dr. Candiss Norse ski for evaluation of microscopic hematuria, frequent UTI. 11/17/2020, CT hematuria work-up was obtained which showed no evidence of urinary tract calculus or hydronephrosis.  Small left renal cyst. Incidental findings of 1.9 x 1.2 cm left lower lobe pulmonary nodule worrisome for malignancy. Patient was referred to establish care with oncology for further evaluation  .  Patient denies any shortness of breath, cough, hemoptysis, unintentional weight loss, fever or night sweats. She currently smokes 1 cigar/day.  She started smoking cigarettes at age of 5.  She quitted at age of 48 and restarted smoking cigar a few years ago.  Patient has a history of depression and is on Cymbalta.  Diabetes, hyperlipidemia, hypertension, peripheral artery disease Per patient she also has history of heart attack in 2011. History of right lower extremity BKA in 2021, 02/15/2020 history of pulmonary embolism involving segmental and subsegmental branches of right upper, middle, lower lobe pulmonary arteries.  She was put on on apixaban. Patient lives with her aunt.  12/07/2020, PET scan showed left lower lobe 1.3 x 1.8 cm lung nodule with SUV of 6.5.  Suspect early stage primary lung neoplasm.  No hilar or mediastinal lymphadenopathy activity.  # 12/23/2020 S/p CT guided biopsy of left lower lobe mass.  Pathology showed non-small cell carcinoma, favor adenocarcinoma.  Positive for TTF-1, negative for p40.     # 01/17/2021, patient underwent left lower lobectomy with lymph node dissection. Pathology showed invasive poorly  differentiated adenocarcinoma, 2.2 cm.  Tumor abuts pleura but visceral pleural surface is not involved by carcinoma.  LVI invasion is present, resection margins are negative for carcinoma.  10 lymph nodes were harvested and 3 lymph nodes were involved with carcinoma. pT1c pN2 Patient's case was discussed at [facility] oncology tumor board.  And consensus recommendation was adjuvant chemotherapy and sent tissue for molecular/PD-L1 testing.  Foundation one testing showed PD-L1 1,  No reportable alterations ERBB2 [id number]  # 02/28/2021 adjuvant carboplatin and Taxol  INTERVAL HISTORY [name] [name] is a 65 y.o. female who has above history reviewed by me today presents for follow up visit for Stage IIIA non small cell lung cancer She reports feeling well.  Appetite has decreased.   She does not drink much water due to decreased appetite. Denies any nausea vomiting diarrhea.  No fever or chills.   Review of Systems  Constitutional:  Negative for appetite change, chills, fatigue and fever.  HENT:   Negative for hearing loss and voice change.   Eyes:  Negative for eye problems.  Respiratory:  Negative for chest tightness and cough.   Cardiovascular:  Negative for chest pain.  Gastrointestinal:  Negative for abdominal distention, abdominal pain and blood in stool.  Endocrine: Negative for hot flashes.  Genitourinary:  Negative for difficulty urinating and frequency.   Musculoskeletal:  Negative for arthralgias.  Skin:  Negative for itching and rash.  Neurological:  Negative for extremity weakness.  Hematological:  Negative for adenopathy.  Psychiatric/Behavioral:  Negative for confusion.    MEDICAL HISTORY:  Past Medical History:  Diagnosis Date   Anemia    Cancer ([place])    CHF (congestive heart failure) ([place])  02/16/20 HFrEF 20-25% in setting of acute DVT/PE, underlying CAD   CKD (chronic kidney disease)    Coronary artery disease    02/20/20: CTO pRCA with left-to-right collaterals, 50%  mLAD, 80% OM1, medical therapy   Depression    Diabetes ([place])    Diabetes mellitus without complication ([place])    DVT (deep venous thrombosis) ([place]) 02/16/2020   RLE DVT proximal veins 02/16/20 Duplex   Hyperlipidemia    Hypertension    Myocardial infarction [facility]) 2009   Stress related per patient; NSTEMI 2012 100% RCA with left-to-right collaterals   PE (pulmonary thromboembolism) ([place]) 02/15/2020   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, RLE BKA   Peripheral artery disease ([place])     SURGICAL HISTORY: Past Surgical History:  Procedure Laterality Date   Right lower extremity BKA Right     SOCIAL HISTORY: Social History   Socioeconomic History   Marital status: Married    Spouse name: Not on file   Number of children: Not on file   Years of education: Not on file   Highest education level: Not on file  Occupational History   Not on file  Tobacco Use   Smoking status: Former    Packs/day: 0.25    Types: Cigars, Cigarettes    Quit date: 01/17/2021    Years since quitting: 0.1   Smokeless tobacco: Never  Vaping Use   Vaping Use: Never used  Substance and Sexual Activity   Alcohol use: Never   Drug use: Not Currently   Sexual activity: Not Currently  Other Topics Concern   Not on file  Social History Narrative   Not on file   Social Determinants of Health   Financial Resource Strain: Not on file  Food Insecurity: Not on file  Transportation Needs: Not on file  Physical Activity: Not on file  Stress: Not on file  Social Connections: Not on file  Intimate Partner Violence: Not on file    FAMILY HISTORY: Family History  Problem Relation Age of Onset   Heart disease Mother    Diabetes Mother    Heart disease Father    Diabetes Father     ALLERGIES:  is allergic to lisinopril.  MEDICATIONS:  Current Outpatient Medications  Medication Sig Dispense Refill   ACCU-CHEK GUIDE test strip USE UP TP 4 TIMES A DAY AS DIRECTED 100 strip 1   Accu-Chek  Softclix Lancets lancets SMARTSIG:Topical 1-4 Times Daily     aspirin EC 81 MG tablet Take 81 mg by mouth in the morning. Swallow whole.     atorvastatin (LIPITOR) 80 MG tablet Take 1 tablet (80 mg total) by mouth daily. 90 tablet 1   blood glucose meter kit and supplies KIT Dispense based on patient and insurance preference. Use up to four times daily as directed. 1 each 0   carboxymethylcellulose (REFRESH PLUS) 0.5 % SOLN Place 1 drop into both eyes in the morning and at bedtime.     diphenhydrAMINE (BENADRYL) 25 MG tablet Take 25 mg by mouth at bedtime.     docusate sodium (COLACE) 100 MG capsule Take 1 capsule (100 mg total) by mouth 2 (two) times daily. 60 capsule 1   DULoxetine (CYMBALTA) 20 MG capsule TAKE 2 CAPSULES (40 MG TOTAL) BY MOUTH IN THE MORNING AND AT BEDTIME. 180 capsule 2   gabapentin (NEURONTIN) 100 MG capsule Take 1 capsule (100 mg total) by mouth 2 (two) times daily. 60 capsule 5   HUMALOG KWIKPEN 100  UNIT/ML KwikPen Inject 8 Units into the skin 3 (three) times daily after meals.     hydrALAZINE (APRESOLINE) 100 MG tablet Take 1 tablet (100 mg total) by mouth 3 (three) times daily. 270 tablet 0   isosorbide dinitrate (ISORDIL) 30 MG tablet TAKE 1 TABLET (30 MG TOTAL) BY MOUTH IN THE MORNING, AT NOON, AND AT BEDTIME. 90 tablet 0   JARDIANCE 10 MG TABS tablet TAKE 1 TABLET BY MOUTH EVERY DAY 90 tablet 0   ketoconazole (NIZORAL) 2 % cream Apply 1 application topically in the morning and at bedtime. Applied to feet 60 g 1   LANTUS SOLOSTAR 100 UNIT/ML Solostar Pen 16 Units at bedtime.     metFORMIN (GLUCOPHAGE-XR) 500 MG 24 hr tablet TAKE 1 TABLET BY MOUTH TWICE A DAY 180 tablet 0   metoprolol succinate (TOPROL-XL) 50 MG 24 hr tablet Take 1 tablet (50 mg total) by mouth daily. Take with or immediately following a meal. 90 tablet 1   Multiple Vitamin (MULTIVITAMIN WITH MINERALS) TABS tablet Take 1 tablet by mouth in the morning. Adults 50+     ondansetron (ZOFRAN) 8 MG tablet Take  1 tablet (8 mg total) by mouth 2 (two) times daily as needed (Nausea or vomiting). Start if needed on the third day after cisplatin. (Patient not taking: Reported on 03/07/2021) 30 tablet 1   prochlorperazine (COMPAZINE) 10 MG tablet Take 1 tablet (10 mg total) by mouth every 6 (six) hours as needed (Nausea or vomiting). (Patient not taking: Reported on 03/07/2021) 30 tablet 1   spironolactone (ALDACTONE) 25 MG tablet TAKE 1 TABLET (25 MG TOTAL) BY MOUTH DAILY. 90 tablet 0   No current facility-administered medications for this visit.     PHYSICAL EXAMINATION: ECOG PERFORMANCE STATUS: 1 - Symptomatic but completely ambulatory Vitals:   03/22/21 0846 03/22/21 0849  BP: 116/71   Pulse: 89 89  Resp: (!) 89 18  Temp: 98 F (36.7 C)   SpO2: 100%    Filed Weights   03/22/21 0846  Weight: 116 lb 8 oz (52.8 kg)    Physical Exam Constitutional:      General: She is not in acute distress. HENT:     Head: Normocephalic and atraumatic.  Eyes:     General: No scleral icterus. Cardiovascular:     Rate and Rhythm: Normal rate and regular rhythm.     Heart sounds: Normal heart sounds.  Pulmonary:     Effort: Pulmonary effort is normal. No respiratory distress.     Breath sounds: No wheezing.  Abdominal:     General: Bowel sounds are normal. There is no distension.     Palpations: Abdomen is soft.  Musculoskeletal:        General: No deformity. Normal range of motion.     Cervical back: Normal range of motion and neck supple.     Comments: Right lower extremity BKA  Skin:    General: Skin is warm and dry.     Findings: No erythema or rash.  Neurological:     Mental Status: She is alert and oriented to person, place, and time. Mental status is at baseline.     Cranial Nerves: No cranial nerve deficit.     Coordination: Coordination normal.  Psychiatric:        Mood and Affect: Mood normal.    LABORATORY DATA:  I have reviewed the data as listed Lab Results  Component Value Date    WBC 3.8 (L) 03/14/2021   HGB  10.1 (L) 03/14/2021   HCT 32.6 (L) 03/14/2021   MCV 92.9 03/14/2021   PLT 296 03/14/2021   Recent Labs    08/10/20 1658 11/17/20 1149 02/17/21 1108 02/28/21 0820 03/07/21 1340 03/14/21 1055  NA 136   < > 140 140 136 137  K 4.6   < > 3.3* 4.0 5.2* 4.2  CL 101   < > 102 106 103 104  CO2 17*   < > '27 28 22 24  ' GLUCOSE 485*   < > 85 189* 208* 149*  BUN 39*   < > 21 34* 45* 47*  CREATININE 1.18*   < > 1.32* 1.35* 1.48* 1.48*  CALCIUM 9.2   < > 8.9 8.9 9.2 8.8*  GFRNONAA 50*   < > 46* 44* 40* 40*  GFRAA 57*  --   --   --   --   --   PROT  --    < > 6.6 6.3* 7.3  --   ALBUMIN 3.2*   < > 3.5 3.3* 3.8  --   AST  --    < > 32 33 33  --   ALT  --    < > '17 28 26  ' --   ALKPHOS  --    < > 136* 118 129*  --   BILITOT  --    < > 0.3 0.4 0.5  --    < > = values in this interval not displayed.    Iron/TIBC/Ferritin/ %Sat    Component Value Date/Time   IRON 44 12/30/2020 1437   TIBC 347 12/30/2020 1437   FERRITIN 15 12/30/2020 1437   IRONPCTSAT 13 12/30/2020 1437      RADIOGRAPHIC STUDIES: I have personally reviewed the radiological images as listed and agreed with the findings in the report. No results found.    ASSESSMENT & PLAN:  1. Non-small cell cancer of left lung (HCC)   2. Stage 3b chronic kidney disease ([place])   3. Encounter for antineoplastic chemotherapy    Cancer Staging Non-small cell lung cancer [facility]) Staging form: Lung, AJCC 8th Edition - Pathologic stage from 01/28/2021: Stage IIIA (pT1c, pN2, cM0) - Signed by Earlie Server, MD on 02/17/2021  #Stage IIIA left lung non-small cell lung cancer-.  Poorly differentiated adenocarcinoma.  Status post left lower lobectomy and lymph node dissection pT1c pN2 [pathology addendum changed to N2]  Labs reviewed and discussed with patient. 8 proceed with cycle 2 carboplatin AUC of 5, Taxol 200 mg/m2   # CKD, creatinine worsened.. IVF 1L NS today.  Advised patient to increase oral hydration. #  Thrombocytosis, likely reactive.  #Normocytic anemia, probably secondary to chronic kidney disease.  Hemoglobin stable.  All questions were answered. The patient knows to call the clinic with any problems questions or concerns.  cc [name], MD   Return of visit: 1 week lab/IV fluid Lab MD 3 weeks with carboplatin and Taxol.    Earlie Server, MD, PhD Hematology Oncology [place] at [facility]  03/22/2021

## 2021-03-23 ENCOUNTER — Telehealth: Payer: Self-pay

## 2021-03-23 NOTE — Telephone Encounter (Signed)
-----   Message from Jennet Maduro, [place] sent at 03/22/2021  1:22 PM EDT ----- Dr [name] and Benjamine Mola,  Ms Trefry has [place] Medicaid and will likely qualify for free oral nutrition supplements.  Please fax [phone]) the following items to [place] with [employer] for authorization  Demographic sheet Rx for boost glucose control or glucerna shake TID (chocolate) Most recent MD note  Approval can take 3-4 weeks. If approved supplements will be shipped to patient's door.   Please let me know if you have questions.  Joli

## 2021-03-23 NOTE — Telephone Encounter (Signed)
Prescription faxed to [place].

## 2021-03-28 ENCOUNTER — Encounter: Payer: Self-pay | Admitting: *Deleted

## 2021-03-29 ENCOUNTER — Inpatient Hospital Stay: Payer: Medicaid Other

## 2021-03-29 ENCOUNTER — Other Ambulatory Visit: Payer: Self-pay

## 2021-03-29 VITALS — BP 149/78 | HR 105 | Temp 96.7°F | Resp 18

## 2021-03-29 DIAGNOSIS — Z86711 Personal history of pulmonary embolism: Secondary | ICD-10-CM | POA: Diagnosis not present

## 2021-03-29 DIAGNOSIS — Z87440 Personal history of urinary (tract) infections: Secondary | ICD-10-CM | POA: Diagnosis not present

## 2021-03-29 DIAGNOSIS — E440 Moderate protein-calorie malnutrition: Secondary | ICD-10-CM | POA: Diagnosis not present

## 2021-03-29 DIAGNOSIS — Z79899 Other long term (current) drug therapy: Secondary | ICD-10-CM | POA: Diagnosis not present

## 2021-03-29 DIAGNOSIS — Z5111 Encounter for antineoplastic chemotherapy: Secondary | ICD-10-CM | POA: Diagnosis not present

## 2021-03-29 DIAGNOSIS — I509 Heart failure, unspecified: Secondary | ICD-10-CM | POA: Diagnosis not present

## 2021-03-29 DIAGNOSIS — D649 Anemia, unspecified: Secondary | ICD-10-CM | POA: Diagnosis not present

## 2021-03-29 DIAGNOSIS — I130 Hypertensive heart and chronic kidney disease with heart failure and stage 1 through stage 4 chronic kidney disease, or unspecified chronic kidney disease: Secondary | ICD-10-CM | POA: Diagnosis not present

## 2021-03-29 DIAGNOSIS — I252 Old myocardial infarction: Secondary | ICD-10-CM | POA: Diagnosis not present

## 2021-03-29 DIAGNOSIS — Z86718 Personal history of other venous thrombosis and embolism: Secondary | ICD-10-CM | POA: Diagnosis not present

## 2021-03-29 DIAGNOSIS — I739 Peripheral vascular disease, unspecified: Secondary | ICD-10-CM | POA: Diagnosis not present

## 2021-03-29 DIAGNOSIS — F32A Depression, unspecified: Secondary | ICD-10-CM | POA: Diagnosis not present

## 2021-03-29 DIAGNOSIS — Z888 Allergy status to other drugs, medicaments and biological substances status: Secondary | ICD-10-CM | POA: Diagnosis not present

## 2021-03-29 DIAGNOSIS — N1832 Chronic kidney disease, stage 3b: Secondary | ICD-10-CM | POA: Diagnosis not present

## 2021-03-29 DIAGNOSIS — R3129 Other microscopic hematuria: Secondary | ICD-10-CM | POA: Diagnosis not present

## 2021-03-29 DIAGNOSIS — Z89511 Acquired absence of right leg below knee: Secondary | ICD-10-CM | POA: Diagnosis not present

## 2021-03-29 DIAGNOSIS — R5383 Other fatigue: Secondary | ICD-10-CM | POA: Diagnosis not present

## 2021-03-29 DIAGNOSIS — E785 Hyperlipidemia, unspecified: Secondary | ICD-10-CM | POA: Diagnosis not present

## 2021-03-29 DIAGNOSIS — E860 Dehydration: Secondary | ICD-10-CM

## 2021-03-29 DIAGNOSIS — N281 Cyst of kidney, acquired: Secondary | ICD-10-CM | POA: Diagnosis not present

## 2021-03-29 DIAGNOSIS — C3492 Malignant neoplasm of unspecified part of left bronchus or lung: Secondary | ICD-10-CM

## 2021-03-29 DIAGNOSIS — E1122 Type 2 diabetes mellitus with diabetic chronic kidney disease: Secondary | ICD-10-CM | POA: Diagnosis not present

## 2021-03-29 DIAGNOSIS — Z23 Encounter for immunization: Secondary | ICD-10-CM | POA: Diagnosis not present

## 2021-03-29 DIAGNOSIS — C3432 Malignant neoplasm of lower lobe, left bronchus or lung: Secondary | ICD-10-CM | POA: Diagnosis not present

## 2021-03-29 DIAGNOSIS — D75839 Thrombocytosis, unspecified: Secondary | ICD-10-CM | POA: Diagnosis not present

## 2021-03-29 DIAGNOSIS — Z87891 Personal history of nicotine dependence: Secondary | ICD-10-CM | POA: Diagnosis not present

## 2021-03-29 LAB — CBC WITH DIFFERENTIAL/PLATELET
Abs Immature Granulocytes: 0.02 10*3/uL (ref 0.00–0.07)
Basophils Absolute: 0 10*3/uL (ref 0.0–0.1)
Basophils Relative: 0 %
Eosinophils Absolute: 0.6 10*3/uL — ABNORMAL HIGH (ref 0.0–0.5)
Eosinophils Relative: 14 %
HCT: 32.9 % — ABNORMAL LOW (ref 36.0–46.0)
Hemoglobin: 10.5 g/dL — ABNORMAL LOW (ref 12.0–15.0)
Immature Granulocytes: 0 %
Lymphocytes Relative: 19 %
Lymphs Abs: 0.9 10*3/uL (ref 0.7–4.0)
MCH: 29.2 pg (ref 26.0–34.0)
MCHC: 31.9 g/dL (ref 30.0–36.0)
MCV: 91.6 fL (ref 80.0–100.0)
Monocytes Absolute: 0.2 10*3/uL (ref 0.1–1.0)
Monocytes Relative: 4 %
Neutro Abs: 2.9 10*3/uL (ref 1.7–7.7)
Neutrophils Relative %: 63 %
Platelets: 279 10*3/uL (ref 150–400)
RBC: 3.59 MIL/uL — ABNORMAL LOW (ref 3.87–5.11)
RDW: 15.3 % (ref 11.5–15.5)
WBC: 4.6 10*3/uL (ref 4.0–10.5)
nRBC: 0 % (ref 0.0–0.2)

## 2021-03-29 LAB — COMPREHENSIVE METABOLIC PANEL
ALT: 24 U/L (ref 0–44)
AST: 32 U/L (ref 15–41)
Albumin: 3.7 g/dL (ref 3.5–5.0)
Alkaline Phosphatase: 113 U/L (ref 38–126)
Anion gap: 11 (ref 5–15)
BUN: 33 mg/dL — ABNORMAL HIGH (ref 8–23)
CO2: 22 mmol/L (ref 22–32)
Calcium: 8.9 mg/dL (ref 8.9–10.3)
Chloride: 102 mmol/L (ref 98–111)
Creatinine, Ser: 1.36 mg/dL — ABNORMAL HIGH (ref 0.44–1.00)
GFR, Estimated: 44 mL/min — ABNORMAL LOW (ref >60)
Glucose, Bld: 157 mg/dL — ABNORMAL HIGH (ref 70–99)
Potassium: 4.4 mmol/L (ref 3.5–5.1)
Sodium: 135 mmol/L (ref 135–145)
Total Bilirubin: <0.1 mg/dL — ABNORMAL LOW (ref 0.3–1.2)
Total Protein: 7.1 g/dL (ref 6.5–8.1)

## 2021-03-29 MED ORDER — SODIUM CHLORIDE 0.9 % IV SOLN
Freq: Once | INTRAVENOUS | Status: AC
  Filled 2021-03-29: qty 250

## 2021-03-29 NOTE — Progress Notes (Signed)
Pt feeling well. States she is trying to drink more fluids. She eats small snacks throughout day.Receiving IV hydration this morning. Discharged to home.

## 2021-04-05 ENCOUNTER — Other Ambulatory Visit: Payer: Self-pay | Admitting: Family Medicine

## 2021-04-05 DIAGNOSIS — I2510 Atherosclerotic heart disease of native coronary artery without angina pectoris: Secondary | ICD-10-CM | POA: Diagnosis not present

## 2021-04-05 DIAGNOSIS — E1151 Type 2 diabetes mellitus with diabetic peripheral angiopathy without gangrene: Secondary | ICD-10-CM

## 2021-04-05 DIAGNOSIS — E785 Hyperlipidemia, unspecified: Secondary | ICD-10-CM | POA: Insufficient documentation

## 2021-04-05 NOTE — Telephone Encounter (Signed)
Requested Prescriptions  Pending Prescriptions Disp Refills  . metFORMIN (GLUCOPHAGE-XR) 500 MG 24 hr tablet [Pharmacy Med Name: METFORMIN HCL ER 500 MG TABLET] 180 tablet 0    Sig: TAKE 1 TABLET BY MOUTH TWICE A DAY     Endocrinology:  Diabetes - Biguanides Failed - 04/05/2021  2:32 PM      Failed - Cr in normal range and within 360 days    Creatinine  Date Value Ref Range Status  04/02/2014 0.75 0.60 - 1.30 mg/dL Final   Creatinine, Ser  Date Value Ref Range Status  03/29/2021 1.36 (H) 0.44 - 1.00 mg/dL Final         Failed - AA eGFR in normal range and within 360 days    EGFR (African American)  Date Value Ref Range Status  04/02/2014 >60 >37m/min Final  11/11/2012 53 (L)  Final   GFR calc Af Amer  Date Value Ref Range Status  08/10/2020 57 (L) >59 mL/min/1.73 Final    Comment:    **In accordance with recommendations from the NKF-ASN Task force,**   [facility] is in the process of updating its eGFR calculation to the   2021 CKD-EPI creatinine equation that estimates kidney function   without a race variable.    EGFR (Non-African Amer.)  Date Value Ref Range Status  04/02/2014 >60 >621mmin Final    Comment:    eGFR values <6097min/1.73 m2 may be an indication of chronic kidney disease (CKD). Calculated eGFR, using the MRDR Study equation, is useful in  patients with stable renal function. The eGFR calculation will not be reliable in acutely ill patients when serum creatinine is changing rapidly. It is not useful in patients on dialysis. The eGFR calculation may not be applicable to patients at the low and high extremes of body sizes, pregnant women, and vegetarians.   11/11/2012 46 (L)  Final    Comment:    eGFR values <18m18mn/1.73 m2 may be an indication of chronic kidney disease (CKD). Calculated eGFR is useful in patients with stable renal function. The eGFR calculation will not be reliable in acutely ill patients when serum creatinine is changing rapidly.  It is not useful in  patients on dialysis. The eGFR calculation may not be applicable to patients at the low and high extremes of body sizes, pregnant women, and vegetarians.    GFR, Estimated  Date Value Ref Range Status  03/29/2021 44 (L) >60 mL/min Final    Comment:    (NOTE) Calculated using the CKD-EPI Creatinine Equation (2021)          Passed - HBA1C is between 0 and 7.9 and within 180 days    Hemoglobin A1C  Date Value Ref Range Status  01/25/2021 7.8  Final         Passed - Valid encounter within last 6 months    Recent Outpatient Visits          1 month ago Chronic pain due to neoplasm   [facility]   3 months ago Type 2 diabetes mellitus with diabetic peripheral angiopathy without gangrene, with long-term current use of insulin (HCC)[facility]   7 months ago Type 2 diabetes mellitus with diabetic peripheral angiopathy without gangrene, with long-term current use of insulin ([facility]CC)[facility]   8 months ago Encounter for medical examination to establish care   [facility]  [facility]      Future Appointments            In 2 months [name], MD [facility], [facility]

## 2021-04-08 ENCOUNTER — Encounter: Payer: Self-pay | Admitting: Oncology

## 2021-04-09 ENCOUNTER — Encounter: Payer: Self-pay | Admitting: Oncology

## 2021-04-11 ENCOUNTER — Other Ambulatory Visit: Payer: Self-pay | Admitting: Thoracic Surgery (Cardiothoracic Vascular Surgery)

## 2021-04-11 DIAGNOSIS — C3490 Malignant neoplasm of unspecified part of unspecified bronchus or lung: Secondary | ICD-10-CM

## 2021-04-12 ENCOUNTER — Inpatient Hospital Stay: Payer: Medicaid Other

## 2021-04-12 ENCOUNTER — Inpatient Hospital Stay (HOSPITAL_BASED_OUTPATIENT_CLINIC_OR_DEPARTMENT_OTHER): Payer: Medicaid Other | Admitting: Oncology

## 2021-04-12 ENCOUNTER — Encounter: Payer: Self-pay | Admitting: Oncology

## 2021-04-12 ENCOUNTER — Other Ambulatory Visit: Payer: Self-pay

## 2021-04-12 ENCOUNTER — Ambulatory Visit: Payer: Self-pay | Admitting: Thoracic Surgery (Cardiothoracic Vascular Surgery)

## 2021-04-12 VITALS — BP 104/60 | HR 91 | Temp 97.6°F | Resp 18 | Wt 113.9 lb

## 2021-04-12 VITALS — BP 146/86 | HR 87

## 2021-04-12 DIAGNOSIS — C3492 Malignant neoplasm of unspecified part of left bronchus or lung: Secondary | ICD-10-CM

## 2021-04-12 DIAGNOSIS — E785 Hyperlipidemia, unspecified: Secondary | ICD-10-CM | POA: Diagnosis not present

## 2021-04-12 DIAGNOSIS — Z87891 Personal history of nicotine dependence: Secondary | ICD-10-CM | POA: Diagnosis not present

## 2021-04-12 DIAGNOSIS — Z5111 Encounter for antineoplastic chemotherapy: Secondary | ICD-10-CM | POA: Diagnosis not present

## 2021-04-12 DIAGNOSIS — N185 Chronic kidney disease, stage 5: Secondary | ICD-10-CM | POA: Insufficient documentation

## 2021-04-12 DIAGNOSIS — E1122 Type 2 diabetes mellitus with diabetic chronic kidney disease: Secondary | ICD-10-CM | POA: Diagnosis not present

## 2021-04-12 DIAGNOSIS — Z7189 Other specified counseling: Secondary | ICD-10-CM

## 2021-04-12 DIAGNOSIS — D649 Anemia, unspecified: Secondary | ICD-10-CM | POA: Diagnosis not present

## 2021-04-12 DIAGNOSIS — N281 Cyst of kidney, acquired: Secondary | ICD-10-CM | POA: Diagnosis not present

## 2021-04-12 DIAGNOSIS — I130 Hypertensive heart and chronic kidney disease with heart failure and stage 1 through stage 4 chronic kidney disease, or unspecified chronic kidney disease: Secondary | ICD-10-CM | POA: Diagnosis not present

## 2021-04-12 DIAGNOSIS — Z23 Encounter for immunization: Secondary | ICD-10-CM | POA: Diagnosis not present

## 2021-04-12 DIAGNOSIS — R5383 Other fatigue: Secondary | ICD-10-CM | POA: Diagnosis not present

## 2021-04-12 DIAGNOSIS — Z79899 Other long term (current) drug therapy: Secondary | ICD-10-CM | POA: Diagnosis not present

## 2021-04-12 DIAGNOSIS — Z87440 Personal history of urinary (tract) infections: Secondary | ICD-10-CM | POA: Diagnosis not present

## 2021-04-12 DIAGNOSIS — I252 Old myocardial infarction: Secondary | ICD-10-CM | POA: Diagnosis not present

## 2021-04-12 DIAGNOSIS — R3129 Other microscopic hematuria: Secondary | ICD-10-CM | POA: Diagnosis not present

## 2021-04-12 DIAGNOSIS — D638 Anemia in other chronic diseases classified elsewhere: Secondary | ICD-10-CM | POA: Insufficient documentation

## 2021-04-12 DIAGNOSIS — Z86711 Personal history of pulmonary embolism: Secondary | ICD-10-CM | POA: Diagnosis not present

## 2021-04-12 DIAGNOSIS — D631 Anemia in chronic kidney disease: Secondary | ICD-10-CM | POA: Insufficient documentation

## 2021-04-12 DIAGNOSIS — F32A Depression, unspecified: Secondary | ICD-10-CM | POA: Diagnosis not present

## 2021-04-12 DIAGNOSIS — C3432 Malignant neoplasm of lower lobe, left bronchus or lung: Secondary | ICD-10-CM | POA: Diagnosis not present

## 2021-04-12 DIAGNOSIS — E440 Moderate protein-calorie malnutrition: Secondary | ICD-10-CM | POA: Insufficient documentation

## 2021-04-12 DIAGNOSIS — Z89511 Acquired absence of right leg below knee: Secondary | ICD-10-CM | POA: Diagnosis not present

## 2021-04-12 DIAGNOSIS — N1832 Chronic kidney disease, stage 3b: Secondary | ICD-10-CM | POA: Insufficient documentation

## 2021-04-12 DIAGNOSIS — I739 Peripheral vascular disease, unspecified: Secondary | ICD-10-CM | POA: Diagnosis not present

## 2021-04-12 DIAGNOSIS — Z86718 Personal history of other venous thrombosis and embolism: Secondary | ICD-10-CM | POA: Diagnosis not present

## 2021-04-12 DIAGNOSIS — Z888 Allergy status to other drugs, medicaments and biological substances status: Secondary | ICD-10-CM | POA: Diagnosis not present

## 2021-04-12 DIAGNOSIS — I509 Heart failure, unspecified: Secondary | ICD-10-CM | POA: Diagnosis not present

## 2021-04-12 DIAGNOSIS — D75839 Thrombocytosis, unspecified: Secondary | ICD-10-CM | POA: Diagnosis not present

## 2021-04-12 LAB — CBC WITH DIFFERENTIAL/PLATELET
Abs Immature Granulocytes: 0.03 10*3/uL (ref 0.00–0.07)
Basophils Absolute: 0 10*3/uL (ref 0.0–0.1)
Basophils Relative: 1 %
Eosinophils Absolute: 0.1 10*3/uL (ref 0.0–0.5)
Eosinophils Relative: 1 %
HCT: 32.1 % — ABNORMAL LOW (ref 36.0–46.0)
Hemoglobin: 9.8 g/dL — ABNORMAL LOW (ref 12.0–15.0)
Immature Granulocytes: 1 %
Lymphocytes Relative: 26 %
Lymphs Abs: 1.7 10*3/uL (ref 0.7–4.0)
MCH: 29.3 pg (ref 26.0–34.0)
MCHC: 30.5 g/dL (ref 30.0–36.0)
MCV: 95.8 fL (ref 80.0–100.0)
Monocytes Absolute: 0.6 10*3/uL (ref 0.1–1.0)
Monocytes Relative: 9 %
Neutro Abs: 4 10*3/uL (ref 1.7–7.7)
Neutrophils Relative %: 62 %
Platelets: 374 10*3/uL (ref 150–400)
RBC: 3.35 MIL/uL — ABNORMAL LOW (ref 3.87–5.11)
RDW: 16.7 % — ABNORMAL HIGH (ref 11.5–15.5)
WBC: 6.5 10*3/uL (ref 4.0–10.5)
nRBC: 0 % (ref 0.0–0.2)

## 2021-04-12 LAB — COMPREHENSIVE METABOLIC PANEL
ALT: 25 U/L (ref 0–44)
AST: 33 U/L (ref 15–41)
Albumin: 3.2 g/dL — ABNORMAL LOW (ref 3.5–5.0)
Alkaline Phosphatase: 136 U/L — ABNORMAL HIGH (ref 38–126)
Anion gap: 7 (ref 5–15)
BUN: 32 mg/dL — ABNORMAL HIGH (ref 8–23)
CO2: 24 mmol/L (ref 22–32)
Calcium: 8.4 mg/dL — ABNORMAL LOW (ref 8.9–10.3)
Chloride: 108 mmol/L (ref 98–111)
Creatinine, Ser: 1.1 mg/dL — ABNORMAL HIGH (ref 0.44–1.00)
GFR, Estimated: 56 mL/min — ABNORMAL LOW (ref >60)
Glucose, Bld: 203 mg/dL — ABNORMAL HIGH (ref 70–99)
Potassium: 3.4 mmol/L — ABNORMAL LOW (ref 3.5–5.1)
Sodium: 139 mmol/L (ref 135–145)
Total Bilirubin: 0.4 mg/dL (ref 0.3–1.2)
Total Protein: 6.5 g/dL (ref 6.5–8.1)

## 2021-04-12 MED ORDER — SODIUM CHLORIDE 0.9 % IV SOLN
Freq: Once | INTRAVENOUS | Status: AC
  Filled 2021-04-12: qty 250

## 2021-04-12 MED ORDER — DIPHENHYDRAMINE HCL 50 MG/ML IJ SOLN
50.0000 mg | Freq: Once | INTRAMUSCULAR | Status: AC
  Administered 2021-04-12: 50 mg via INTRAVENOUS
  Filled 2021-04-12: qty 1

## 2021-04-12 MED ORDER — POTASSIUM CHLORIDE CRYS ER 10 MEQ PO TBCR: 10.0000 meq | EXTENDED_RELEASE_TABLET | Freq: Every day | ORAL | 0 refills | Status: DC

## 2021-04-12 MED ORDER — INFLUENZA VAC SPLIT QUAD 0.5 ML IM SUSY
0.5000 mL | PREFILLED_SYRINGE | Freq: Once | INTRAMUSCULAR | Status: AC
  Administered 2021-04-12: 0.5 mL via INTRAMUSCULAR
  Filled 2021-04-12: qty 0.5

## 2021-04-12 MED ORDER — INFLUENZA VAC A&B SA ADJ QUAD 0.5 ML IM PRSY: 0.5000 mL | PREFILLED_SYRINGE | Freq: Once | INTRAMUSCULAR | Status: DC

## 2021-04-12 MED ORDER — SODIUM CHLORIDE 0.9 % IV SOLN
200.0000 mg/m2 | Freq: Once | INTRAVENOUS | Status: AC
  Administered 2021-04-12: 306 mg via INTRAVENOUS
  Filled 2021-04-12: qty 51

## 2021-04-12 MED ORDER — FAMOTIDINE 20 MG IN NS 100 ML IVPB
20.0000 mg | Freq: Once | INTRAVENOUS | Status: AC
  Administered 2021-04-12: 20 mg via INTRAVENOUS
  Filled 2021-04-12: qty 20

## 2021-04-12 MED ORDER — SODIUM CHLORIDE 0.9 % IV SOLN
10.0000 mg | Freq: Once | INTRAVENOUS | Status: AC
  Administered 2021-04-12: 10 mg via INTRAVENOUS
  Filled 2021-04-12: qty 10

## 2021-04-12 MED ORDER — SODIUM CHLORIDE 0.9 % IV SOLN
150.0000 mg | Freq: Once | INTRAVENOUS | Status: AC
  Administered 2021-04-12: 150 mg via INTRAVENOUS
  Filled 2021-04-12: qty 150

## 2021-04-12 MED ORDER — SODIUM CHLORIDE 0.9 % IV SOLN
330.0000 mg | Freq: Once | INTRAVENOUS | Status: AC
  Administered 2021-04-12: 330 mg via INTRAVENOUS
  Filled 2021-04-12: qty 33

## 2021-04-12 MED ORDER — PALONOSETRON HCL INJECTION 0.25 MG/5ML
0.2500 mg | Freq: Once | INTRAVENOUS | Status: AC
  Administered 2021-04-12: 0.25 mg via INTRAVENOUS
  Filled 2021-04-12: qty 5

## 2021-04-12 NOTE — Patient Instructions (Signed)
[facility] [place] [facility]  Discharge Instructions: Thank you for choosing [facility] to provide your oncology and hematology care.  If you have a lab appointment with the Cancer Center, please go directly to the Cancer Center and check in at the registration area.  Wear comfortable clothing and clothing appropriate for easy access to any Portacath or PICC line.   We strive to give you quality time with your provider. You may need to reschedule your appointment if you arrive late (15 or more minutes).  Arriving late affects you and other patients whose appointments are after yours.  Also, if you miss three or more appointments without notifying the office, you may be dismissed from the clinic at the provider's discretion.      For prescription refill requests, have your pharmacy contact our office and allow 72 hours for refills to be completed.    Today you received the following chemotherapy and/or immunotherapy agents Taxol & Carboplatin      To help prevent nausea and vomiting after your treatment, we encourage you to take your nausea medication as directed.  BELOW ARE SYMPTOMS THAT SHOULD BE REPORTED IMMEDIATELY: *FEVER GREATER THAN 100.4 F (38 C) OR HIGHER *CHILLS OR SWEATING *NAUSEA AND VOMITING THAT IS NOT CONTROLLED WITH YOUR NAUSEA MEDICATION *UNUSUAL SHORTNESS OF BREATH *UNUSUAL BRUISING OR BLEEDING *URINARY PROBLEMS (pain or burning when urinating, or frequent urination) *BOWEL PROBLEMS (unusual diarrhea, constipation, pain near the anus) TENDERNESS IN MOUTH AND THROAT WITH OR WITHOUT PRESENCE OF ULCERS (sore throat, sores in mouth, or a toothache) UNUSUAL RASH, SWELLING OR PAIN  UNUSUAL VAGINAL DISCHARGE OR ITCHING   Items with * indicate a potential emergency and should be followed up as soon as possible or go to the Emergency Department if any problems should occur.  Please show the CHEMOTHERAPY ALERT CARD or IMMUNOTHERAPY ALERT CARD at  check-in to the Emergency Department and triage nurse.  Should you have questions after your visit or need to cancel or reschedule your appointment, please contact [facility] [place] [facility]  [phone] and follow the prompts.  Office hours are 8:00 a.m. to 4:30 p.m. Monday - Friday. Please note that voicemails left after 4:00 p.m. may not be returned until the following business day.  We are closed weekends and major holidays. You have access to a nurse at all times for urgent questions. Please call the main number to the clinic [phone] and follow the prompts.  For any non-urgent questions, you may also contact your provider using MyChart. We now offer e-Visits for anyone 18 and older to request care online for non-urgent symptoms. For details visit [email or web address].   Also download the MyChart app! Go to the app store, search "MyChart", open the app, select , and log in with your MyChart username and password.  Due to Covid, a mask is required upon entering the hospital/clinic. If you do not have a mask, one will be given to you upon arrival. For doctor visits, patients may have 1 support person aged 18 or older with them. For treatment visits, patients cannot have anyone with them due to current Covid guidelines and our immunocompromised population.  

## 2021-04-12 NOTE — Progress Notes (Signed)
Hematology/Oncology progress note [facility] [phone] [phone]   Patient Care Team: [name], MD as PCP - General (Family Medicine)  REFERRING PROVIDER: [name], MD  CHIEF COMPLAINTS/REASON FOR VISIT:  Follow up for stage IIIA lung cancer  HISTORY OF PRESENTING ILLNESS:  Patient was recently seen by urology Dr. Candiss Norse [place] for evaluation of microscopic hematuria, frequent UTI. 11/17/2020, CT hematuria work-up was obtained which showed no evidence of urinary tract calculus or hydronephrosis.  Small left renal cyst. Incidental findings of 1.9 x 1.2 cm left lower lobe pulmonary nodule worrisome for malignancy. Patient was referred to establish care with oncology for further evaluation  .  Patient denies any shortness of breath, cough, hemoptysis, unintentional weight loss, fever or night sweats. She currently smokes 1 cigar/day.  She started smoking cigarettes at age of 32.  She quitted at age of 64 and restarted smoking cigar a few years ago.  Patient has a history of depression and is on Cymbalta.  Diabetes, hyperlipidemia, hypertension, peripheral artery disease Per patient she also has history of heart attack in 2011. History of right lower extremity BKA in 2021, 02/15/2020 history of pulmonary embolism involving segmental and subsegmental branches of right upper, middle, lower lobe pulmonary arteries.  She was put on on apixaban. Patient lives with her aunt.  12/07/2020, PET scan showed left lower lobe 1.3 x 1.8 cm lung nodule with SUV of 6.5.  Suspect early stage primary lung neoplasm.  No hilar or mediastinal lymphadenopathy activity.  # 12/23/2020 S/p CT guided biopsy of left lower lobe mass.  Pathology showed non-small cell carcinoma, favor adenocarcinoma.  Positive for TTF-1, negative for p40.     # 01/17/2021, patient underwent left lower lobectomy with lymph node dissection. Pathology showed invasive poorly  differentiated adenocarcinoma, 2.2 cm.  Tumor abuts pleura but visceral pleural surface is not involved by carcinoma.  LVI invasion is present, resection margins are negative for carcinoma.  10 lymph nodes were harvested and 3 lymph nodes were involved with carcinoma. pT1c pN2 Patient's case was discussed at [facility] oncology tumor board.  And consensus recommendation was adjuvant chemotherapy and sent tissue for molecular/PD-L1 testing.  Foundation one testing showed PD-L1 1,  No reportable alterations ERBB2 [id number]  # 02/28/2021 adjuvant carboplatin and Taxol  INTERVAL HISTORY [name] [name] is a 65 y.o. female who has above history reviewed by me today presents for follow up visit for Stage IIIA non small cell lung cancer Overall she tolerates chemotherapy well.  Fatigue has increased.  Denies any nausea vomiting diarrhea.  No fever or chills.   Review of Systems  Constitutional:  Negative for appetite change, chills, fatigue and fever.  HENT:   Negative for hearing loss and voice change.   Eyes:  Negative for eye problems.  Respiratory:  Negative for chest tightness and cough.   Cardiovascular:  Negative for chest pain.  Gastrointestinal:  Negative for abdominal distention, abdominal pain and blood in stool.  Endocrine: Negative for hot flashes.  Genitourinary:  Negative for difficulty urinating and frequency.   Musculoskeletal:  Negative for arthralgias.  Skin:  Negative for itching and rash.  Neurological:  Negative for extremity weakness.  Hematological:  Negative for adenopathy.  Psychiatric/Behavioral:  Negative for confusion.    MEDICAL HISTORY:  Past Medical History:  Diagnosis Date   Anemia    Cancer ([place])    CHF (congestive heart failure) ([place])    02/16/20 HFrEF 20-25% in setting of acute DVT/PE, underlying  CAD   CKD (chronic kidney disease)    Coronary artery disease    02/20/20: CTO pRCA with left-to-right collaterals, 50% mLAD, 80% OM1, medical therapy   Depression     Diabetes ([place])    Diabetes mellitus without complication ([place])    DVT (deep venous thrombosis) ([place]) 02/16/2020   RLE DVT proximal veins 02/16/20 Duplex   Hyperlipidemia    Hypertension    Myocardial infarction [facility]) 2009   Stress related per patient; NSTEMI 2012 100% RCA with left-to-right collaterals   PE (pulmonary thromboembolism) ([place]) 02/15/2020   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, RLE BKA   Peripheral artery disease ([place])     SURGICAL HISTORY: Past Surgical History:  Procedure Laterality Date   Right lower extremity BKA Right     SOCIAL HISTORY: Social History   Socioeconomic History   Marital status: Married    Spouse name: Not on file   Number of children: Not on file   Years of education: Not on file   Highest education level: Not on file  Occupational History   Not on file  Tobacco Use   Smoking status: Former    Packs/day: 0.25    Types: Cigars, Cigarettes    Quit date: 01/17/2021    Years since quitting: 0.2   Smokeless tobacco: Never  Vaping Use   Vaping Use: Never used  Substance and Sexual Activity   Alcohol use: Never   Drug use: Not Currently   Sexual activity: Not Currently  Other Topics Concern   Not on file  Social History Narrative   Not on file   Social Determinants of Health   Financial Resource Strain: Not on file  Food Insecurity: Not on file  Transportation Needs: Not on file  Physical Activity: Not on file  Stress: Not on file  Social Connections: Not on file  Intimate Partner Violence: Not on file    FAMILY HISTORY: Family History  Problem Relation Age of Onset   Heart disease Mother    Diabetes Mother    Heart disease Father    Diabetes Father     ALLERGIES:  is allergic to lisinopril.  MEDICATIONS:  Current Outpatient Medications  Medication Sig Dispense Refill   ACCU-CHEK GUIDE test strip USE UP TP 4 TIMES A DAY AS DIRECTED 100 strip 1   Accu-Chek Softclix Lancets lancets SMARTSIG:Topical 1-4  Times Daily     aspirin EC 81 MG tablet Take 81 mg by mouth in the morning. Swallow whole.     atorvastatin (LIPITOR) 80 MG tablet Take 1 tablet (80 mg total) by mouth daily. 90 tablet 1   blood glucose meter kit and supplies KIT Dispense based on patient and insurance preference. Use up to four times daily as directed. 1 each 0   carboxymethylcellulose (REFRESH PLUS) 0.5 % SOLN Place 1 drop into both eyes in the morning and at bedtime.     diphenhydrAMINE (BENADRYL) 25 MG tablet Take 25 mg by mouth at bedtime.     docusate sodium (COLACE) 100 MG capsule Take 1 capsule (100 mg total) by mouth 2 (two) times daily. 60 capsule 1   DULoxetine (CYMBALTA) 20 MG capsule TAKE 2 CAPSULES (40 MG TOTAL) BY MOUTH IN THE MORNING AND AT BEDTIME. 180 capsule 2   gabapentin (NEURONTIN) 100 MG capsule Take 1 capsule (100 mg total) by mouth 2 (two) times daily. 60 capsule 5   HUMALOG KWIKPEN 100 UNIT/ML KwikPen Inject 8 Units into the skin 3 (  three) times daily after meals.     hydrALAZINE (APRESOLINE) 100 MG tablet Take 1 tablet (100 mg total) by mouth 3 (three) times daily. 270 tablet 0   isosorbide dinitrate (ISORDIL) 30 MG tablet TAKE 1 TABLET (30 MG TOTAL) BY MOUTH IN THE MORNING, AT NOON, AND AT BEDTIME. 90 tablet 0   JARDIANCE 10 MG TABS tablet TAKE 1 TABLET BY MOUTH EVERY DAY 90 tablet 0   ketoconazole (NIZORAL) 2 % cream Apply 1 application topically in the morning and at bedtime. Applied to feet 60 g 1   LANTUS SOLOSTAR 100 UNIT/ML Solostar Pen 16 Units at bedtime.     metFORMIN (GLUCOPHAGE-XR) 500 MG 24 hr tablet TAKE 1 TABLET BY MOUTH TWICE A DAY 180 tablet 0   metoprolol succinate (TOPROL-XL) 50 MG 24 hr tablet Take 1 tablet (50 mg total) by mouth daily. Take with or immediately following a meal. 90 tablet 1   Multiple Vitamin (MULTIVITAMIN WITH MINERALS) TABS tablet Take 1 tablet by mouth in the morning. Adults 50+     ondansetron (ZOFRAN) 8 MG tablet Take 1 tablet (8 mg total) by mouth 2 (two) times  daily as needed (Nausea or vomiting). Start if needed on the third day after cisplatin. 30 tablet 1   potassium chloride (KLOR-CON) 10 MEQ tablet Take 1 tablet (10 mEq total) by mouth daily. 14 tablet 0   prochlorperazine (COMPAZINE) 10 MG tablet Take 1 tablet (10 mg total) by mouth every 6 (six) hours as needed (Nausea or vomiting). 30 tablet 1   spironolactone (ALDACTONE) 25 MG tablet TAKE 1 TABLET (25 MG TOTAL) BY MOUTH DAILY. 90 tablet 0   No current facility-administered medications for this visit.   Facility-Administered Medications Ordered in Other Visits  Medication Dose Route Frequency Provider Last Rate Last Admin   CARBOplatin (PARAPLATIN) 330 mg in sodium chloride 0.9 % 250 mL chemo infusion  330 mg Intravenous Once Earlie Server, MD       PACLitaxel (TAXOL) 306 mg in sodium chloride 0.9 % 500 mL chemo infusion (> 34m/m2)  200 mg/m2 (Treatment Plan Recorded) Intravenous Once YEarlie Server MD 184 mL/hr at 04/12/21 1129 306 mg at 04/12/21 1129     PHYSICAL EXAMINATION: ECOG PERFORMANCE STATUS: 1 - Symptomatic but completely ambulatory Vitals:   04/12/21 0840  BP: 104/60  Pulse: 91  Resp: 18  Temp: 97.6 F (36.4 C)   Filed Weights   04/12/21 0840  Weight: 113 lb 14.4 oz (51.7 kg)    Physical Exam Constitutional:      General: She is not in acute distress. HENT:     Head: Normocephalic and atraumatic.  Eyes:     General: No scleral icterus. Cardiovascular:     Rate and Rhythm: Normal rate and regular rhythm.     Heart sounds: Normal heart sounds.  Pulmonary:     Effort: Pulmonary effort is normal. No respiratory distress.     Breath sounds: No wheezing.     Comments: Absent breath sounds left basilar Abdominal:     General: Bowel sounds are normal. There is no distension.     Palpations: Abdomen is soft.  Musculoskeletal:        General: No deformity. Normal range of motion.     Cervical back: Normal range of motion and neck supple.     Comments: Right lower extremity  BKA  Skin:    General: Skin is warm and dry.     Findings: No erythema or rash.  Neurological:  Mental Status: She is alert and oriented to person, place, and time. Mental status is at baseline.     Cranial Nerves: No cranial nerve deficit.     Coordination: Coordination normal.  Psychiatric:        Mood and Affect: Mood normal.    LABORATORY DATA:  I have reviewed the data as listed Lab Results  Component Value Date   WBC 6.5 04/12/2021   HGB 9.8 (L) 04/12/2021   HCT 32.1 (L) 04/12/2021   MCV 95.8 04/12/2021   PLT 374 04/12/2021   Recent Labs    08/10/20 1658 11/17/20 1149 03/22/21 0832 03/29/21 0806 04/12/21 0809  NA 136   < > 138 135 139  K 4.6   < > 4.2 4.4 3.4*  CL 101   < > 106 102 108  CO2 17*   < > '23 22 24  ' GLUCOSE 485*   < > 91 157* 203*  BUN 39*   < > 69* 33* 32*  CREATININE 1.18*   < > 1.82* 1.36* 1.10*  CALCIUM 9.2   < > 8.9 8.9 8.4*  GFRNONAA 50*   < > 31* 44* 56*  GFRAA 57*  --   --   --   --   PROT  --    < > 7.1 7.1 6.5  ALBUMIN 3.2*   < > 3.7 3.7 3.2*  AST  --    < > 32 32 33  ALT  --    < > '26 24 25  ' ALKPHOS  --    < > 113 113 136*  BILITOT  --    < > 0.5 <0.1* 0.4   < > = values in this interval not displayed.    Iron/TIBC/Ferritin/ %Sat    Component Value Date/Time   IRON 44 12/30/2020 1437   TIBC 347 12/30/2020 1437   FERRITIN 15 12/30/2020 1437   IRONPCTSAT 13 12/30/2020 1437      RADIOGRAPHIC STUDIES: I have personally reviewed the radiological images as listed and agreed with the findings in the report. No results found.    ASSESSMENT & PLAN:  1. Non-small cell cancer of left lung ([place])   2. Encounter for antineoplastic chemotherapy   3. Normocytic anemia   4. Stage 3b chronic kidney disease ([place])   5. Moderate protein-calorie malnutrition ([place])    Cancer Staging Non-small cell lung cancer [facility]) Staging form: Lung, AJCC 8th Edition - Pathologic stage from 01/28/2021: Stage IIIA (pT1c, pN2, cM0) - Signed by Earlie Server,  MD on 02/17/2021  #Stage IIIA left lung non-small cell lung cancer-.  Poorly differentiated adenocarcinoma.  Status post left lower lobectomy and lymph node dissection pT1c pN2 [pathology addendum changed to N2]  She has negative surgical margin, no clear benefit of postoperative RT.  Labs are reviewed and discussed with patient. Proceed with cycle 3 carboplatin AUC of 5, Taxol 200 mg/m2 I recommend adjuvant Tecentriq after she finishes 4 cycles of carboplatin and Taxol- TPS >=1  # CKD, creatinine improved.  Encourage oral hydration. # Thrombocytosis, likely reactive.  #Normocytic anemia, probably secondary to chronic kidney disease.  Hemoglobin has decreased.  Continue monitor. #Malnutrition, albumin 3.2.  Advised patient to start nutrition supplements when she receives  All questions were answered. The patient knows to call the clinic with any problems questions or concerns.  cc [name], MD   Return of visit:  Lab MD 3 weeks with carboplatin and Taxol.    Earlie Server, MD, PhD  Hematology [place] at [facility]  04/12/2021

## 2021-04-12 NOTE — Progress Notes (Signed)
Patient here for follow up. Reports numbness to fingers. Pt also states that she was vomiting over the weekend but antiemetics helped.

## 2021-04-12 NOTE — Progress Notes (Signed)
Nutrition Follow-up:  Patient with stage II lung cancer.  Patient receiving adjuvant chemotherapy.    Met with patient during infusion.  Patient reports that over the weekend had vomiting episode.  Took nausea medication and resolved.  Yesterday ate potato soup.  Has not heard anything about oral nutrition supplements if they have been approved.      Medications: reviewed  Labs: glucose 203, BUN 32, creatinine 1.10  Anthropometrics:   Weight 113 lb today  116 lb on 10/4 110 lb on 9/12   NUTRITION DIAGNOSIS: Inadequate oral intake continues    INTERVENTION:  Encouraged patient to be proactive with taking nausea medication.   Discussed strategies to help with nausea and vomiting.  Handout provided RD to follow-up with [employer] representative    MONITORING, EVALUATION, GOAL: weight trends, intake   NEXT VISIT: Tuesday, Nov 15 during infusion  [name] [name] B. Zenia Resides, [place], [place] Registered Dietitian [phone] (mobile)

## 2021-04-14 ENCOUNTER — Encounter: Payer: Self-pay | Admitting: Oncology

## 2021-04-19 ENCOUNTER — Ambulatory Visit: Payer: Self-pay | Admitting: Thoracic Surgery (Cardiothoracic Vascular Surgery)

## 2021-04-22 ENCOUNTER — Other Ambulatory Visit: Payer: Self-pay | Admitting: Family Medicine

## 2021-04-22 DIAGNOSIS — I25118 Atherosclerotic heart disease of native coronary artery with other forms of angina pectoris: Secondary | ICD-10-CM

## 2021-04-22 DIAGNOSIS — E1151 Type 2 diabetes mellitus with diabetic peripheral angiopathy without gangrene: Secondary | ICD-10-CM

## 2021-04-23 NOTE — Telephone Encounter (Signed)
Requested Prescriptions  Pending Prescriptions Disp Refills  . JARDIANCE 10 MG TABS tablet [Pharmacy Med Name: JARDIANCE 10 MG TABLET] 90 tablet 0    Sig: TAKE 1 TABLET BY [place] DAY     Endocrinology:  Diabetes - SGLT2 Inhibitors Failed - 04/22/2021  3:35 PM      Failed - Cr in normal range and within 360 days    Creatinine  Date Value Ref Range Status  04/02/2014 0.75 0.60 - 1.30 mg/dL Final   Creatinine, Ser  Date Value Ref Range Status  04/12/2021 1.10 (H) 0.44 - 1.00 mg/dL Final         Failed - AA eGFR in normal range and within 360 days    EGFR (African American)  Date Value Ref Range Status  04/02/2014 >60 >28m/min Final  11/11/2012 53 (L)  Final   GFR calc Af Amer  Date Value Ref Range Status  08/10/2020 57 (L) >59 mL/min/1.73 Final    Comment:    **In accordance with recommendations from the NKF-ASN Task force,**   [facility] is in the process of updating its eGFR calculation to the   2021 CKD-EPI creatinine equation that estimates kidney function   without a race variable.    EGFR (Non-African Amer.)  Date Value Ref Range Status  04/02/2014 >60 >618mmin Final    Comment:    eGFR values <6078min/1.73 m2 may be an indication of chronic kidney disease (CKD). Calculated eGFR, using the MRDR Study equation, is useful in  patients with stable renal function. The eGFR calculation will not be reliable in acutely ill patients when serum creatinine is changing rapidly. It is not useful in patients on dialysis. The eGFR calculation may not be applicable to patients at the low and high extremes of body sizes, pregnant women, and vegetarians.   11/11/2012 46 (L)  Final    Comment:    eGFR values <73m68mn/1.73 m2 may be an indication of chronic kidney disease (CKD). Calculated eGFR is useful in patients with stable renal function. The eGFR calculation will not be reliable in acutely ill patients when serum creatinine is changing rapidly. It is not useful in   patients on dialysis. The eGFR calculation may not be applicable to patients at the low and high extremes of body sizes, pregnant women, and vegetarians.    GFR, Estimated  Date Value Ref Range Status  04/12/2021 56 (L) >60 mL/min Final    Comment:    (NOTE) Calculated using the CKD-EPI Creatinine Equation (2021)          Passed - LDL in normal range and within 360 days    Ldl Cholesterol, Calc  Date Value Ref Range Status  04/01/2014 147 (H) 0 - 100 mg/dL Final   LDL Chol Calc (NIH)  Date Value Ref Range Status  08/10/2020 111 (H) 0 - 99 mg/dL Final   LDL Cholesterol  Date Value Ref Range Status  01/25/2021 94  Final         Passed - HBA1C is between 0 and 7.9 and within 180 days    Hemoglobin A1C  Date Value Ref Range Status  01/25/2021 7.8  Final         Passed - Valid encounter within last 6 months    Recent Outpatient Visits          2 months ago Chronic pain due to neoplasm   [facility]   4 months ago Type 2 diabetes mellitus with diabetic  peripheral angiopathy without gangrene, with long-term current use of insulin ([place])   [facility] [name], MD   8 months ago Type 2 diabetes mellitus with diabetic peripheral angiopathy without gangrene, with long-term current use of insulin ([place])   [facility] [name], MD   9 months ago Encounter for medical examination to establish care   [place], MD      Future Appointments            In 1 month [name], MD [facility], [place]           . spironolactone (ALDACTONE) 25 MG tablet [Pharmacy Med Name: SPIRONOLACTONE 25 MG TABLET] 90 tablet 0    Sig: TAKE 1 TABLET (25 MG TOTAL) BY MOUTH DAILY.     Cardiovascular: Diuretics - Aldosterone Antagonist Failed - 04/22/2021  3:35 PM      Failed - Cr in normal range and within 360 days    Creatinine  Date Value Ref Range Status  04/02/2014 0.75 0.60 - 1.30 mg/dL  Final   Creatinine, Ser  Date Value Ref Range Status  04/12/2021 1.10 (H) 0.44 - 1.00 mg/dL Final         Failed - K in normal range and within 360 days    Potassium  Date Value Ref Range Status  04/12/2021 3.4 (L) 3.5 - 5.1 mmol/L Final  04/02/2014 3.8 3.5 - 5.1 mmol/L Final         Failed - Last BP in normal range    BP Readings from Last 1 Encounters:  04/12/21 (!) 146/86         Passed - Na in normal range and within 360 days    Sodium  Date Value Ref Range Status  04/12/2021 139 135 - 145 mmol/L Final  01/25/2021 141 137 - 147 Final  04/02/2014 141 136 - 145 mmol/L Final         Passed - Valid encounter within last 6 months    Recent Outpatient Visits          2 months ago Chronic pain due to neoplasm   [facility] [name], MD   4 months ago Type 2 diabetes mellitus with diabetic peripheral angiopathy without gangrene, with long-term current use of insulin ([place])   [facility] [name], MD   8 months ago Type 2 diabetes mellitus with diabetic peripheral angiopathy without gangrene, with long-term current use of insulin ([place])   [facility] [name], MD   9 months ago Encounter for medical examination to establish care   [place], MD      Future Appointments            In 1 month [name], MD [facility], [facility]

## 2021-04-26 ENCOUNTER — Encounter: Payer: Self-pay | Admitting: Thoracic Surgery (Cardiothoracic Vascular Surgery)

## 2021-04-26 ENCOUNTER — Other Ambulatory Visit: Payer: Self-pay

## 2021-04-26 ENCOUNTER — Ambulatory Visit
Admission: RE | Admit: 2021-04-26 | Discharge: 2021-04-26 | Disposition: A | Discharge status: Home / Self Care | Payer: Medicaid Other | Source: Ambulatory Visit | Attending: Thoracic Surgery (Cardiothoracic Vascular Surgery) | Admitting: Thoracic Surgery (Cardiothoracic Vascular Surgery)

## 2021-04-26 ENCOUNTER — Ambulatory Visit (INDEPENDENT_AMBULATORY_CARE_PROVIDER_SITE_OTHER): Payer: Medicaid Other | Admitting: Thoracic Surgery (Cardiothoracic Vascular Surgery)

## 2021-04-26 ENCOUNTER — Telehealth: Payer: Self-pay | Admitting: Family Medicine

## 2021-04-26 VITALS — BP 133/76 | HR 92 | Resp 20 | Ht 65.0 in | Wt 113.0 lb

## 2021-04-26 DIAGNOSIS — Z09 Encounter for follow-up examination after completed treatment for conditions other than malignant neoplasm: Secondary | ICD-10-CM | POA: Diagnosis not present

## 2021-04-26 DIAGNOSIS — Z9889 Other specified postprocedural states: Secondary | ICD-10-CM | POA: Diagnosis not present

## 2021-04-26 DIAGNOSIS — C3490 Malignant neoplasm of unspecified part of unspecified bronchus or lung: Secondary | ICD-10-CM

## 2021-04-26 DIAGNOSIS — C3492 Malignant neoplasm of unspecified part of left bronchus or lung: Secondary | ICD-10-CM | POA: Diagnosis not present

## 2021-04-26 DIAGNOSIS — Z902 Acquired absence of lung [part of]: Secondary | ICD-10-CM | POA: Diagnosis not present

## 2021-04-26 NOTE — Progress Notes (Signed)
[address]       [place] [address]             [phone]    HPI: [name] [name] returns for scheduled postoperative follow-up visit  [name] [name] is a 65 year old woman with a history of tobacco abuse, hypertension, hyperlipidemia, diabetes, PAD, BKA, MI, stage III chronic kidney disease, and depression.  She was being evaluated for microscopic hematuria and was found to have a left lower lobe lung nodule.  On PET/CT the nodule was hypermetabolic.  There was no evidence of adenopathy.  Needle biopsy showed adenocarcinoma.  I did a robotic assisted left lower lobectomy and node dissection on 01/17/2021.  She had some mucous plugging and atelectasis postop but that cleared without needing bronchoscopy.  She went home on day 4.  I saw in the office on 02/09/2021 she was still having a fair amount of pain.  I gave her prescription for oxycodone.  In the interim since her last visit she has been feeling well.  She started chemotherapy.  She tolerated the first dose of that well.  But does complain of some pain and a hard vein where the IV was.  She is not having any respiratory complaints.  She is not having any incisional pain.  Past Medical History:  Diagnosis Date   Anemia    Cancer ([place])    CHF (congestive heart failure) ([place])    02/16/20 HFrEF 20-25% in setting of acute DVT/PE, underlying CAD   CKD (chronic kidney disease)    Coronary artery disease    02/20/20: CTO pRCA with left-to-right collaterals, 50% mLAD, 80% OM1, medical therapy   Depression    Diabetes ([place])    Diabetes mellitus without complication ([place])    DVT (deep venous thrombosis) ([place]) 02/16/2020   RLE DVT proximal veins 02/16/20 Duplex   Hyperlipidemia    Hypertension    Myocardial infarction [facility]) 2009   Stress related per patient; NSTEMI 2012 100% RCA with left-to-right collaterals   PE (pulmonary thromboembolism) ([place]) 02/15/2020   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, RLE BKA    Peripheral artery disease (H[facility])     Current Outpatient Medications  Medication Sig Dispense Refill   ACCU-CHEK GUIDE test strip USE UP TP 4 TIMES A DAY AS DIRECTED 100 strip 1   Accu-Chek Softclix Lancets lancets SMARTSIG:Topical 1-4 Times Daily     aspirin EC 81 MG tablet Take 81 mg by mouth in the morning. Swallow whole.     atorvastatin (LIPITOR) 80 MG tablet Take 1 tablet (80 mg total) by mouth daily. 90 tablet 1   blood glucose meter kit and supplies KIT Dispense based on patient and insurance preference. Use up to four times daily as directed. 1 each 0   carboxymethylcellulose (REFRESH PLUS) 0.5 % SOLN Place 1 drop into both eyes in the morning and at bedtime.     diphenhydrAMINE (BENADRYL) 25 MG tablet Take 25 mg by mouth at bedtime.     docusate sodium (COLACE) 100 MG capsule Take 1 capsule (100 mg total) by mouth 2 (two) times daily. 60 capsule 1   DULoxetine (CYMBALTA) 20 MG capsule TAKE 2 CAPSULES (40 MG TOTAL) BY MOUTH IN THE MORNING AND AT BEDTIME. 180 capsule 2   gabapentin (NEURONTIN) 100 MG capsule Take 1 capsule (100 mg total) by mouth 2 (two) times daily. 60 capsule 5   HUMALOG KWIKPEN 100 UNIT/ML KwikPen Inject 8 Units into the skin  3 (three) times daily after meals.     hydrALAZINE (APRESOLINE) 100 MG tablet Take 1 tablet (100 mg total) by mouth 3 (three) times daily. 270 tablet 0   isosorbide dinitrate (ISORDIL) 30 MG tablet TAKE 1 TABLET (30 MG TOTAL) BY MOUTH IN THE MORNING, AT NOON, AND AT BEDTIME. 90 tablet 0   JARDIANCE 10 MG TABS tablet TAKE 1 TABLET BY MOUTH EVERY DAY 90 tablet 0   ketoconazole (NIZORAL) 2 % cream Apply 1 application topically in the morning and at bedtime. Applied to feet 60 g 1   LANTUS SOLOSTAR 100 UNIT/ML Solostar Pen 16 Units at bedtime.     metFORMIN (GLUCOPHAGE-XR) 500 MG 24 hr tablet TAKE 1 TABLET BY MOUTH TWICE A DAY 180 tablet 0   metoprolol succinate (TOPROL-XL) 50 MG 24 hr tablet Take 1 tablet (50 mg total) by mouth daily. Take with or  immediately following a meal. 90 tablet 1   Multiple Vitamin (MULTIVITAMIN WITH MINERALS) TABS tablet Take 1 tablet by mouth in the morning. Adults 50+     ondansetron (ZOFRAN) 8 MG tablet Take 1 tablet (8 mg total) by mouth 2 (two) times daily as needed (Nausea or vomiting). Start if needed on the third day after cisplatin. 30 tablet 1   potassium chloride (KLOR-CON) 10 MEQ tablet Take 1 tablet (10 mEq total) by mouth daily. 14 tablet 0   prochlorperazine (COMPAZINE) 10 MG tablet Take 1 tablet (10 mg total) by mouth every 6 (six) hours as needed (Nausea or vomiting). 30 tablet 1   spironolactone (ALDACTONE) 25 MG tablet TAKE 1 TABLET (25 MG TOTAL) BY MOUTH DAILY. 90 tablet 0   No current facility-administered medications for this visit.    Physical Exam BP 133/76 (BP Location: Left Arm, Patient Position: Sitting)   Pulse 92   Resp 20   Ht '5\' 5"'  (1.651 m)   Wt 113 lb (51.3 kg)   SpO2 96% Comment: RA  BMI 18.19 kg/m  65 year old woman in no acute distress Alert and oriented x3 Lungs absent breath sounds left base, otherwise clear Incisions well-healed Cardiac regular rate and rhythm  Diagnostic Tests: I personally reviewed her chest x-ray.  Shows postoperative changes and elevated left hemidiaphragm.  Impression: [name] [name] is a 65 year old woman with multiple medical issues and history of tobacco abuse.  She was found to have a lung nodule while being worked up for hematuria.  That was ultimately biopsy-proven to be adenocarcinoma.  Clinically she had stage Ia disease.  I did a robotic assisted left lower lobectomy and node dissection on 01/17/2021.  She was upstaged to stage IIIa (T1c, N2).  She is now about 3 months out from surgery.  She is doing well.  She is not having any incisional pain.  She denies having any respiratory issues.  She has some elevation of the left hemidiaphragm a little out of proportion to the lower lobectomy itself.  I suspect she has some phrenic nerve  dysfunction related to the node dissection.  That should improve with time but could take up to a year.  She currently is undergoing adjuvant chemotherapy with Dr. [name].  She does have superficial thrombus of an arm vein related to her chemotherapy.  I advised her to place warm compresses on that area 3 times a day.  Plan: Follow-up with Dr. [name] I will be happy to see her back anytime in the future if I can be of any further assistance with her care  Revonda Standard  Roxan Hockey, MD [facility] [phone]

## 2021-04-26 NOTE — Telephone Encounter (Incomplete)
Medication Refill - Medication: gabapentin (NEURONTIN) 100 MG capsule   Has the patient contacted their pharmacy? Yes.   (Agent: If no, request that the patient contact the pharmacy for the refill. If patient does not wish to contact the pharmacy document the reason why and proceed with request.) (Agent: If yes, when and what did the pharmacy advise?)  Preferred Pharmacy (with phone number or street name):  [id number] - [place], [address]  [place] [address]  Phone: [phone] Fax: [phone]   Has the patient been seen for an appointment in the last year OR does the patient have an upcoming appointment? Yes.    Agent: Please be advised that RX refills may take up to 3 business days. We ask that you follow-up with your pharmacy.

## 2021-04-27 ENCOUNTER — Encounter: Payer: Self-pay | Admitting: Oncology

## 2021-04-28 DIAGNOSIS — E119 Type 2 diabetes mellitus without complications: Secondary | ICD-10-CM | POA: Diagnosis not present

## 2021-04-28 DIAGNOSIS — C3492 Malignant neoplasm of unspecified part of left bronchus or lung: Secondary | ICD-10-CM | POA: Diagnosis not present

## 2021-05-03 ENCOUNTER — Encounter: Payer: Self-pay | Admitting: Oncology

## 2021-05-03 ENCOUNTER — Inpatient Hospital Stay: Payer: Medicaid Other

## 2021-05-03 ENCOUNTER — Inpatient Hospital Stay: Payer: Medicaid Other | Attending: Oncology

## 2021-05-03 ENCOUNTER — Other Ambulatory Visit: Payer: Self-pay

## 2021-05-03 ENCOUNTER — Inpatient Hospital Stay (HOSPITAL_BASED_OUTPATIENT_CLINIC_OR_DEPARTMENT_OTHER): Payer: Medicaid Other | Admitting: Oncology

## 2021-05-03 VITALS — BP 103/64 | HR 80 | Resp 16

## 2021-05-03 VITALS — BP 72/48 | HR 76 | Temp 97.3°F

## 2021-05-03 DIAGNOSIS — N179 Acute kidney failure, unspecified: Secondary | ICD-10-CM | POA: Insufficient documentation

## 2021-05-03 DIAGNOSIS — C3492 Malignant neoplasm of unspecified part of left bronchus or lung: Secondary | ICD-10-CM

## 2021-05-03 DIAGNOSIS — E119 Type 2 diabetes mellitus without complications: Secondary | ICD-10-CM | POA: Insufficient documentation

## 2021-05-03 DIAGNOSIS — Z87891 Personal history of nicotine dependence: Secondary | ICD-10-CM | POA: Diagnosis not present

## 2021-05-03 DIAGNOSIS — Z8249 Family history of ischemic heart disease and other diseases of the circulatory system: Secondary | ICD-10-CM | POA: Insufficient documentation

## 2021-05-03 DIAGNOSIS — R110 Nausea: Secondary | ICD-10-CM | POA: Insufficient documentation

## 2021-05-03 DIAGNOSIS — N1832 Chronic kidney disease, stage 3b: Secondary | ICD-10-CM | POA: Diagnosis not present

## 2021-05-03 DIAGNOSIS — R3129 Other microscopic hematuria: Secondary | ICD-10-CM | POA: Insufficient documentation

## 2021-05-03 DIAGNOSIS — I10 Essential (primary) hypertension: Secondary | ICD-10-CM | POA: Insufficient documentation

## 2021-05-03 DIAGNOSIS — C3432 Malignant neoplasm of lower lobe, left bronchus or lung: Secondary | ICD-10-CM | POA: Diagnosis not present

## 2021-05-03 DIAGNOSIS — Z5111 Encounter for antineoplastic chemotherapy: Secondary | ICD-10-CM

## 2021-05-03 DIAGNOSIS — N3000 Acute cystitis without hematuria: Secondary | ICD-10-CM

## 2021-05-03 DIAGNOSIS — I9589 Other hypotension: Secondary | ICD-10-CM

## 2021-05-03 DIAGNOSIS — E785 Hyperlipidemia, unspecified: Secondary | ICD-10-CM | POA: Diagnosis not present

## 2021-05-03 DIAGNOSIS — Z833 Family history of diabetes mellitus: Secondary | ICD-10-CM | POA: Insufficient documentation

## 2021-05-03 DIAGNOSIS — R300 Dysuria: Secondary | ICD-10-CM

## 2021-05-03 DIAGNOSIS — Z79899 Other long term (current) drug therapy: Secondary | ICD-10-CM | POA: Diagnosis not present

## 2021-05-03 DIAGNOSIS — I252 Old myocardial infarction: Secondary | ICD-10-CM | POA: Insufficient documentation

## 2021-05-03 DIAGNOSIS — R197 Diarrhea, unspecified: Secondary | ICD-10-CM | POA: Diagnosis not present

## 2021-05-03 LAB — COMPREHENSIVE METABOLIC PANEL
ALT: 19 U/L (ref 0–44)
AST: 25 U/L (ref 15–41)
Albumin: 3.1 g/dL — ABNORMAL LOW (ref 3.5–5.0)
Alkaline Phosphatase: 117 U/L (ref 38–126)
Anion gap: 10 (ref 5–15)
BUN: 31 mg/dL — ABNORMAL HIGH (ref 8–23)
CO2: 23 mmol/L (ref 22–32)
Calcium: 8.7 mg/dL — ABNORMAL LOW (ref 8.9–10.3)
Chloride: 106 mmol/L (ref 98–111)
Creatinine, Ser: 1.51 mg/dL — ABNORMAL HIGH (ref 0.44–1.00)
GFR, Estimated: 39 mL/min — ABNORMAL LOW (ref >60)
Glucose, Bld: 133 mg/dL — ABNORMAL HIGH (ref 70–99)
Potassium: 4.4 mmol/L (ref 3.5–5.1)
Sodium: 139 mmol/L (ref 135–145)
Total Bilirubin: 0.1 mg/dL — ABNORMAL LOW (ref 0.3–1.2)
Total Protein: 6.4 g/dL — ABNORMAL LOW (ref 6.5–8.1)

## 2021-05-03 LAB — CBC WITH DIFFERENTIAL/PLATELET
Abs Immature Granulocytes: 0.01 10*3/uL (ref 0.00–0.07)
Basophils Absolute: 0 10*3/uL (ref 0.0–0.1)
Basophils Relative: 1 %
Eosinophils Absolute: 0.1 10*3/uL (ref 0.0–0.5)
Eosinophils Relative: 2 %
HCT: 24.8 % — ABNORMAL LOW (ref 36.0–46.0)
Hemoglobin: 8 g/dL — ABNORMAL LOW (ref 12.0–15.0)
Immature Granulocytes: 0 %
Lymphocytes Relative: 26 %
Lymphs Abs: 1 10*3/uL (ref 0.7–4.0)
MCH: 30.4 pg (ref 26.0–34.0)
MCHC: 32.3 g/dL (ref 30.0–36.0)
MCV: 94.3 fL (ref 80.0–100.0)
Monocytes Absolute: 0.4 10*3/uL (ref 0.1–1.0)
Monocytes Relative: 10 %
Neutro Abs: 2.4 10*3/uL (ref 1.7–7.7)
Neutrophils Relative %: 61 %
Platelets: 176 10*3/uL (ref 150–400)
RBC: 2.63 MIL/uL — ABNORMAL LOW (ref 3.87–5.11)
RDW: 17.5 % — ABNORMAL HIGH (ref 11.5–15.5)
WBC: 3.9 10*3/uL — ABNORMAL LOW (ref 4.0–10.5)
nRBC: 0 % (ref 0.0–0.2)

## 2021-05-03 LAB — URINALYSIS, COMPLETE (UACMP) WITH MICROSCOPIC
Bacteria, UA: NONE SEEN
Bilirubin Urine: NEGATIVE
Glucose, UA: >=500 mg/dL — AB
Hgb urine dipstick: NEGATIVE
Ketones, ur: NEGATIVE mg/dL
Nitrite: NEGATIVE
Protein, ur: 100 mg/dL — AB
Specific Gravity, Urine: 1.01 (ref 1.005–1.030)
WBC, UA: >50 WBC/hpf — ABNORMAL HIGH (ref 0–5)
pH: 8 (ref 5.0–8.0)

## 2021-05-03 MED ORDER — SODIUM CHLORIDE 0.9 % IV SOLN: 8.0000 mg | Freq: Once | INTRAVENOUS | Status: DC

## 2021-05-03 MED ORDER — ONDANSETRON HCL 4 MG/2ML IJ SOLN
8.0000 mg | Freq: Once | INTRAMUSCULAR | Status: AC
  Administered 2021-05-03: 8 mg via INTRAVENOUS
  Filled 2021-05-03: qty 4

## 2021-05-03 MED ORDER — CIPROFLOXACIN HCL 250 MG PO TABS: 250.0000 mg | ORAL_TABLET | Freq: Two times a day (BID) | ORAL | 0 refills | Status: DC

## 2021-05-03 MED ORDER — SODIUM CHLORIDE 0.9 % IV SOLN
Freq: Once | INTRAVENOUS | Status: AC
  Filled 2021-05-03: qty 250

## 2021-05-03 NOTE — Progress Notes (Signed)
Hematology/Oncology progress note [phone] [phone]   Patient Care Team: [name], MD as PCP - General (Family Medicine) Earlie Server, MD as Consulting Physician (Hematology and Oncology)  REFERRING PROVIDER: [name], MD  CHIEF COMPLAINTS/REASON FOR VISIT:  Follow up for stage IIIA lung cancer  HISTORY OF PRESENTING ILLNESS:  Patient was recently seen by urology Dr. Candiss Norse [place] for evaluation of microscopic hematuria, frequent UTI. 11/17/2020, CT hematuria work-up was obtained which showed no evidence of urinary tract calculus or hydronephrosis.  Small left renal cyst. Incidental findings of 1.9 x 1.2 cm left lower lobe pulmonary nodule worrisome for malignancy. Patient was referred to establish care with oncology for further evaluation  .  Patient denies any shortness of breath, cough, hemoptysis, unintentional weight loss, fever or night sweats. She currently smokes 1 cigar/day.  She started smoking cigarettes at age of 48.  She quitted at age of 40 and restarted smoking cigar a few years ago.  Patient has a history of depression and is on Cymbalta.  Diabetes, hyperlipidemia, hypertension, peripheral artery disease Per patient she also has history of heart attack in 2011. History of right lower extremity BKA in 2021, 02/15/2020 history of pulmonary embolism involving segmental and subsegmental branches of right upper, middle, lower lobe pulmonary arteries.  She was put on on apixaban. Patient lives with her aunt.  12/07/2020, PET scan showed left lower lobe 1.3 x 1.8 cm lung nodule with SUV of 6.5.  Suspect early stage primary lung neoplasm.  No hilar or mediastinal lymphadenopathy activity.  # 12/23/2020 S/p CT guided biopsy of left lower lobe mass.  Pathology showed non-small cell carcinoma, favor adenocarcinoma.  Positive for TTF-1, negative for p40.     # 01/17/2021, patient underwent left lower lobectomy with lymph node dissection. Pathology  showed invasive poorly differentiated adenocarcinoma, 2.2 cm.  Tumor abuts pleura but visceral pleural surface is not involved by carcinoma.  LVI invasion is present, resection margins are negative for carcinoma.  10 lymph nodes were harvested and 3 lymph nodes were involved with carcinoma. pT1c pN2 Patient's case was discussed at [facility] oncology tumor board.  And consensus recommendation was adjuvant chemotherapy and sent tissue for molecular/PD-L1 testing.  Foundation one testing showed PD-L1 1,  No reportable alterations ERBB2 [id number]  # 02/28/2021 adjuvant carboplatin and Taxol  INTERVAL HISTORY [name] [name] is a 65 y.o. female who has above history reviewed by me today presents for follow up visit for Stage IIIA  Patient reports feeling tired and fatigued.  2 days ago he had been feeling sick, nausea, 1 episode of diarrhea.  No dysuria symptoms.  No fever or chills.  She takes blood pressure medications.  Patient reports that she has been taking her cholesterol medication which is not current medication list.   Review of Systems  Constitutional:  Negative for appetite change, chills, fatigue and fever.  HENT:   Negative for hearing loss and voice change.   Eyes:  Negative for eye problems.  Respiratory:  Negative for chest tightness and cough.   Cardiovascular:  Negative for chest pain.  Gastrointestinal:  Positive for diarrhea and nausea. Negative for abdominal distention, abdominal pain and blood in stool.  Endocrine: Negative for hot flashes.  Genitourinary:  Negative for difficulty urinating and frequency.   Musculoskeletal:  Negative for arthralgias.  Skin:  Negative for itching and rash.  Neurological:  Negative for extremity weakness.  Hematological:  Negative for adenopathy.  Psychiatric/Behavioral:  Negative for confusion.  MEDICAL HISTORY:  Past Medical History:  Diagnosis Date   Anemia    Cancer ([place])    CHF (congestive heart failure) ([place])    02/16/20 HFrEF  20-25% in setting of acute DVT/PE, underlying CAD   CKD (chronic kidney disease)    Coronary artery disease    02/20/20: CTO pRCA with left-to-right collaterals, 50% mLAD, 80% OM1, medical therapy   Depression    Diabetes ([place])    Diabetes mellitus without complication ([place])    DVT (deep venous thrombosis) ([place]) 02/16/2020   RLE DVT proximal veins 02/16/20 Duplex   Hyperlipidemia    Hypertension    Myocardial infarction [facility]) 2009   Stress related per patient; NSTEMI 2012 100% RCA with left-to-right collaterals   PE (pulmonary thromboembolism) ([place]) 02/15/2020   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, RLE BKA   Peripheral artery disease ([facility])     SURGICAL HISTORY: Past Surgical History:  Procedure Laterality Date   Right lower extremity BKA Right     SOCIAL HISTORY: Social History   Socioeconomic History   Marital status: Married    Spouse name: Not on file   Number of children: Not on file   Years of education: Not on file   Highest education level: Not on file  Occupational History   Not on file  Tobacco Use   Smoking status: Former    Packs/day: 0.25    Types: Cigars, Cigarettes    Quit date: 01/17/2021    Years since quitting: 0.2   Smokeless tobacco: Never  Vaping Use   Vaping Use: Never used  Substance and Sexual Activity   Alcohol use: Never   Drug use: Not Currently   Sexual activity: Not Currently  Other Topics Concern   Not on file  Social History Narrative   Not on file   Social Determinants of Health   Financial Resource Strain: Not on file  Food Insecurity: Not on file  Transportation Needs: Not on file  Physical Activity: Not on file  Stress: Not on file  Social Connections: Not on file  Intimate Partner Violence: Not on file    FAMILY HISTORY: Family History  Problem Relation Age of Onset   Heart disease Mother    Diabetes Mother    Heart disease Father    Diabetes Father     ALLERGIES:  is allergic to  lisinopril.  MEDICATIONS:  Current Outpatient Medications  Medication Sig Dispense Refill   ACCU-CHEK GUIDE test strip USE UP TP 4 TIMES A DAY AS DIRECTED 100 strip 1   Accu-Chek Softclix Lancets lancets SMARTSIG:Topical 1-4 Times Daily     aspirin EC 81 MG tablet Take 81 mg by mouth in the morning. Swallow whole.     atorvastatin (LIPITOR) 80 MG tablet Take 1 tablet (80 mg total) by mouth daily. 90 tablet 1   blood glucose meter kit and supplies KIT Dispense based on patient and insurance preference. Use up to four times daily as directed. 1 each 0   carboxymethylcellulose (REFRESH PLUS) 0.5 % SOLN Place 1 drop into both eyes in the morning and at bedtime.     diphenhydrAMINE (BENADRYL) 25 MG tablet Take 25 mg by mouth at bedtime.     docusate sodium (COLACE) 100 MG capsule Take 1 capsule (100 mg total) by mouth 2 (two) times daily. 60 capsule 1   DULoxetine (CYMBALTA) 20 MG capsule TAKE 2 CAPSULES (40 MG TOTAL) BY MOUTH IN THE MORNING AND AT BEDTIME. 180 capsule  2   gabapentin (NEURONTIN) 100 MG capsule Take 1 capsule (100 mg total) by mouth 2 (two) times daily. 60 capsule 5   HUMALOG KWIKPEN 100 UNIT/ML KwikPen Inject 8 Units into the skin 3 (three) times daily after meals.     hydrALAZINE (APRESOLINE) 100 MG tablet Take 1 tablet (100 mg total) by mouth 3 (three) times daily. 270 tablet 0   isosorbide dinitrate (ISORDIL) 30 MG tablet TAKE 1 TABLET (30 MG TOTAL) BY MOUTH IN THE MORNING, AT NOON, AND AT BEDTIME. 90 tablet 0   JARDIANCE 10 MG TABS tablet TAKE 1 TABLET BY MOUTH EVERY DAY 90 tablet 0   ketoconazole (NIZORAL) 2 % cream Apply 1 application topically in the morning and at bedtime. Applied to feet 60 g 1   LANTUS SOLOSTAR 100 UNIT/ML Solostar Pen 16 Units at bedtime.     metFORMIN (GLUCOPHAGE-XR) 500 MG 24 hr tablet TAKE 1 TABLET BY MOUTH TWICE A DAY 180 tablet 0   metoprolol succinate (TOPROL-XL) 50 MG 24 hr tablet Take 1 tablet (50 mg total) by mouth daily. Take with or immediately  following a meal. 90 tablet 1   Multiple Vitamin (MULTIVITAMIN WITH MINERALS) TABS tablet Take 1 tablet by mouth in the morning. Adults 50+     ondansetron (ZOFRAN) 8 MG tablet Take 1 tablet (8 mg total) by mouth 2 (two) times daily as needed (Nausea or vomiting). Start if needed on the third day after cisplatin. 30 tablet 1   potassium chloride (KLOR-CON) 10 MEQ tablet Take 1 tablet (10 mEq total) by mouth daily. 14 tablet 0   prochlorperazine (COMPAZINE) 10 MG tablet Take 1 tablet (10 mg total) by mouth every 6 (six) hours as needed (Nausea or vomiting). 30 tablet 1   spironolactone (ALDACTONE) 25 MG tablet TAKE 1 TABLET (25 MG TOTAL) BY MOUTH DAILY. 90 tablet 0   No current facility-administered medications for this visit.     PHYSICAL EXAMINATION: ECOG PERFORMANCE STATUS: 1 - Symptomatic but completely ambulatory Vitals:   05/03/21 0936  BP: (!) 72/48  Pulse: 76  Temp: (!) 97.3 F (36.3 C)  SpO2: 100%   Filed Weights    Physical Exam Constitutional:      General: She is not in acute distress.    Appearance: She is ill-appearing.  HENT:     Head: Normocephalic and atraumatic.  Eyes:     General: No scleral icterus. Cardiovascular:     Rate and Rhythm: Normal rate and regular rhythm.     Heart sounds: Normal heart sounds.  Pulmonary:     Effort: Pulmonary effort is normal. No respiratory distress.     Breath sounds: No wheezing.     Comments: Absent breath sounds left basilar Abdominal:     General: Bowel sounds are normal. There is no distension.     Palpations: Abdomen is soft.  Musculoskeletal:        General: No deformity. Normal range of motion.     Cervical back: Normal range of motion and neck supple.     Comments: Right lower extremity BKA  Skin:    General: Skin is warm and dry.     Findings: No erythema or rash.  Neurological:     Mental Status: She is alert and oriented to person, place, and time. Mental status is at baseline.     Cranial Nerves: No  cranial nerve deficit.     Coordination: Coordination normal.  Psychiatric:        Mood  and Affect: Mood normal.    LABORATORY DATA:  I have reviewed the data as listed Lab Results  Component Value Date   WBC 3.9 (L) 05/03/2021   HGB 8.0 (L) 05/03/2021   HCT 24.8 (L) 05/03/2021   MCV 94.3 05/03/2021   PLT 176 05/03/2021   Recent Labs    08/10/20 1658 11/17/20 1149 03/29/21 0806 04/12/21 0809 05/03/21 0848  NA 136   < > 135 139 139  K 4.6   < > 4.4 3.4* 4.4  CL 101   < > 102 108 106  CO2 17*   < > _0 GLUCOSE 485*   < > 157* 203* 133*  BUN 39*   < > 33* 32* 31*  CREATININE 1.18*   < > 1.36* 1.10* 1.51*  CALCIUM 9.2   < > 8.9 8.4* 8.7*  GFRNONAA 50*   < > 44* 56* 39*  GFRAA 57*  --   --   --   --   PROT  --    < > 7.1 6.5 6.4*  ALBUMIN 3.2*   < > 3.7 3.2* 3.1*  AST  --    < > 32 33 25  ALT  --    < > _1 ALKPHOS  --    < > 113 136* 117  BILITOT  --    < > <0.1* 0.4 0.1*   < > = values in this interval not displayed.    Iron/TIBC/Ferritin/ %Sat    Component Value Date/Time   IRON 44 12/30/2020 1437   TIBC 347 12/30/2020 1437   FERRITIN 15 12/30/2020 1437   IRONPCTSAT 13 12/30/2020 1437      RADIOGRAPHIC STUDIES: I have personally reviewed the radiological images as listed and agreed with the findings in the report. DG Chest 2 View  Result Date: 04/26/2021 CLINICAL DATA:  Non-small cell lung cancer. History of left lung surgery. EXAM: CHEST - 2 VIEW COMPARISON:  Radiographs 02/09/2021, 01/22/2021 and 01/21/2021. FINDINGS: There is stable volume loss in the left hemithorax with elevation of the left hemidiaphragm and left basilar pleuroparenchymal scarring related to previous partial left pneumonectomy. The right lung is clear. The heart size and mediastinal contours are stable. No evidence of pneumothorax. The bones appear unremarkable. IMPRESSION: Stable postoperative chest.  No acute cardiopulmonary process. Electronically Signed   By: Richardean Sale  M.D.   On: 04/26/2021 15:35      ASSESSMENT & PLAN:  1. Encounter for antineoplastic chemotherapy   2. Non-small cell cancer of left lung ([place])   3. Acute cystitis without hematuria   4. Other specified hypotension   5. AKI (acute kidney injury) ([place])    Cancer Staging Non-small cell lung cancer [facility]) Staging form: Lung, AJCC 8th Edition - Pathologic stage from 01/28/2021: Stage IIIA (pT1c, pN2, cM0) - Signed by Earlie Server, MD on 02/17/2021  #Stage IIIA left lung non-small cell lung cancer-.  Poorly differentiated adenocarcinoma.  Status post left lower lobectomy and lymph node dissection pT1c pN2 [pathology addendum changed to N2]  She has negative surgical margin, no clear benefit of postoperative RT.  Hold off chemotherapy due to hypotension/nausea/fatigue  #Hypotension, nausea Differential includes infection, dehydration secondary to poor p.o. intake, etc. Patient received 1 L of IV normal saline and Zofran 8 mg x 1..  BP improved.  And the patient subjectively improves. I recommend patient to hold off her BP meds.  She has taken today's BP meds including metoprolol, isosorbide,  hydralazine. Patient will follow-up with me tomorrow with reevaluation assessment.  #Dysuria, check urine, urine culture.  Urine is positive for leukocyte, given her immunocompromised state, I will treat empirically for UTI.  I called patient and recommend patient to start Cipro empirically 250 mg twice daily for 3 days.  Waiting for urine culture. Rx sent to her pharmacy  #AKI on CKD, IV fluid today. #Normocytic anemia, probably secondary to chronic kidney disease.  Hemoglobin has decreased to 8.  Repeat CBC   +/- Possible blood transfusion. #Malnutrition, albumin 3.1.  Advised patient to start nutrition supplements when she receives  All questions were answered. The patient knows to call the clinic with any problems questions or concerns. Patient was recommended to go to emergency room if her symptoms get  worse. cc [name], MD   Return of visit:  1 day for reevaluation assessment.    Earlie Server, MD, PhD  05/03/2021

## 2021-05-03 NOTE — Progress Notes (Signed)
Nutrition Follow-up:  Patient with stage II lung cancer.  Patient receiving adjuvant chemotherapy.    Met with patient during infusion. Patient reports that she is feeling better now than when she first came in.  Receiving fluids. Patient with low blood pressure, weakness, fatigue, nauseated when came in. Patient said that glucerna shakes will be coming soon.  She signed needed paperwork from [facility].  Patient has been eating mostly potato soup.  Says that she does not like to smell food cooking that sister prepares.     Medications: reviewed  Labs: reviewed  Anthropometrics:   Weight refused to be weighed today 113 lb on 10/25 116 lb on 10/4 110 lb on 9/12   NUTRITION DIAGNOSIS: Inadequate oral intake continues   INTERVENTION:  Encouraged patient to take nausea medications.  Patient says that drug store gave her ezetimibe and she does not know who prescribed it to her.  She says it makes her sick. Encouraged patient to follow-up with drug store and prescribing MD regarding symptoms.       MONITORING, EVALUATION, GOAL: weight trends, intake   NEXT VISIT: to be determined with treatment  Charvis Lightner B. Zenia Resides, [place], [place] Registered Dietitian [phone] (mobile)

## 2021-05-03 NOTE — Progress Notes (Signed)
Patient here for follow up. Pt reports that she is feeling nauseated, weak and fatigue.

## 2021-05-04 ENCOUNTER — Inpatient Hospital Stay (HOSPITAL_BASED_OUTPATIENT_CLINIC_OR_DEPARTMENT_OTHER): Payer: Medicaid Other | Admitting: Oncology

## 2021-05-04 ENCOUNTER — Inpatient Hospital Stay: Payer: Medicaid Other

## 2021-05-04 ENCOUNTER — Encounter: Payer: Self-pay | Admitting: Oncology

## 2021-05-04 VITALS — BP 103/52 | HR 87 | Temp 98.2°F | Resp 18 | Wt 111.2 lb

## 2021-05-04 DIAGNOSIS — I252 Old myocardial infarction: Secondary | ICD-10-CM | POA: Diagnosis not present

## 2021-05-04 DIAGNOSIS — R3129 Other microscopic hematuria: Secondary | ICD-10-CM | POA: Diagnosis not present

## 2021-05-04 DIAGNOSIS — C3492 Malignant neoplasm of unspecified part of left bronchus or lung: Secondary | ICD-10-CM

## 2021-05-04 DIAGNOSIS — N179 Acute kidney failure, unspecified: Secondary | ICD-10-CM

## 2021-05-04 DIAGNOSIS — Z833 Family history of diabetes mellitus: Secondary | ICD-10-CM | POA: Diagnosis not present

## 2021-05-04 DIAGNOSIS — C3432 Malignant neoplasm of lower lobe, left bronchus or lung: Secondary | ICD-10-CM | POA: Diagnosis not present

## 2021-05-04 DIAGNOSIS — E785 Hyperlipidemia, unspecified: Secondary | ICD-10-CM | POA: Diagnosis not present

## 2021-05-04 DIAGNOSIS — N1832 Chronic kidney disease, stage 3b: Secondary | ICD-10-CM | POA: Diagnosis not present

## 2021-05-04 DIAGNOSIS — E119 Type 2 diabetes mellitus without complications: Secondary | ICD-10-CM | POA: Diagnosis not present

## 2021-05-04 DIAGNOSIS — R197 Diarrhea, unspecified: Secondary | ICD-10-CM | POA: Diagnosis not present

## 2021-05-04 DIAGNOSIS — R110 Nausea: Secondary | ICD-10-CM | POA: Diagnosis not present

## 2021-05-04 DIAGNOSIS — E440 Moderate protein-calorie malnutrition: Secondary | ICD-10-CM

## 2021-05-04 DIAGNOSIS — I10 Essential (primary) hypertension: Secondary | ICD-10-CM | POA: Diagnosis not present

## 2021-05-04 DIAGNOSIS — R300 Dysuria: Secondary | ICD-10-CM

## 2021-05-04 DIAGNOSIS — I959 Hypotension, unspecified: Secondary | ICD-10-CM | POA: Diagnosis not present

## 2021-05-04 DIAGNOSIS — N3000 Acute cystitis without hematuria: Secondary | ICD-10-CM | POA: Diagnosis not present

## 2021-05-04 DIAGNOSIS — Z8249 Family history of ischemic heart disease and other diseases of the circulatory system: Secondary | ICD-10-CM | POA: Diagnosis not present

## 2021-05-04 DIAGNOSIS — Z87891 Personal history of nicotine dependence: Secondary | ICD-10-CM | POA: Diagnosis not present

## 2021-05-04 DIAGNOSIS — Z79899 Other long term (current) drug therapy: Secondary | ICD-10-CM | POA: Diagnosis not present

## 2021-05-04 LAB — COMPREHENSIVE METABOLIC PANEL
ALT: 19 U/L (ref 0–44)
AST: 27 U/L (ref 15–41)
Albumin: 3.1 g/dL — ABNORMAL LOW (ref 3.5–5.0)
Alkaline Phosphatase: 112 U/L (ref 38–126)
Anion gap: 9 (ref 5–15)
BUN: 32 mg/dL — ABNORMAL HIGH (ref 8–23)
CO2: 23 mmol/L (ref 22–32)
Calcium: 8.2 mg/dL — ABNORMAL LOW (ref 8.9–10.3)
Chloride: 107 mmol/L (ref 98–111)
Creatinine, Ser: 1.66 mg/dL — ABNORMAL HIGH (ref 0.44–1.00)
GFR, Estimated: 34 mL/min — ABNORMAL LOW (ref >60)
Glucose, Bld: 186 mg/dL — ABNORMAL HIGH (ref 70–99)
Potassium: 4.6 mmol/L (ref 3.5–5.1)
Sodium: 139 mmol/L (ref 135–145)
Total Bilirubin: 0.1 mg/dL — ABNORMAL LOW (ref 0.3–1.2)
Total Protein: 6.1 g/dL — ABNORMAL LOW (ref 6.5–8.1)

## 2021-05-04 LAB — CBC WITH DIFFERENTIAL/PLATELET
Abs Immature Granulocytes: 0.01 10*3/uL (ref 0.00–0.07)
Basophils Absolute: 0 10*3/uL (ref 0.0–0.1)
Basophils Relative: 0 %
Eosinophils Absolute: 0.1 10*3/uL (ref 0.0–0.5)
Eosinophils Relative: 2 %
HCT: 26.6 % — ABNORMAL LOW (ref 36.0–46.0)
Hemoglobin: 8.3 g/dL — ABNORMAL LOW (ref 12.0–15.0)
Immature Granulocytes: 0 %
Lymphocytes Relative: 22 %
Lymphs Abs: 1 10*3/uL (ref 0.7–4.0)
MCH: 30 pg (ref 26.0–34.0)
MCHC: 31.2 g/dL (ref 30.0–36.0)
MCV: 96 fL (ref 80.0–100.0)
Monocytes Absolute: 0.5 10*3/uL (ref 0.1–1.0)
Monocytes Relative: 12 %
Neutro Abs: 2.8 10*3/uL (ref 1.7–7.7)
Neutrophils Relative %: 64 %
Platelets: 182 10*3/uL (ref 150–400)
RBC: 2.77 MIL/uL — ABNORMAL LOW (ref 3.87–5.11)
RDW: 17.9 % — ABNORMAL HIGH (ref 11.5–15.5)
WBC: 4.5 10*3/uL (ref 4.0–10.5)
nRBC: 0 % (ref 0.0–0.2)

## 2021-05-04 LAB — URINE CULTURE

## 2021-05-04 MED ORDER — SODIUM CHLORIDE 0.9 % IV SOLN
Freq: Once | INTRAVENOUS | Status: AC
  Filled 2021-05-04: qty 250

## 2021-05-04 NOTE — Progress Notes (Signed)
Hematology/Oncology progress note [phone] [phone]   Patient Care Team: [name], MD as PCP - General (Family Medicine) Earlie Server, MD as Consulting Physician (Hematology and Oncology)  REFERRING PROVIDER: [name], MD  CHIEF COMPLAINTS/REASON FOR VISIT:  Follow up for stage IIIA lung cancer  HISTORY OF PRESENTING ILLNESS:  Patient was recently seen by urology Dr. Candiss Norse ski for evaluation of microscopic hematuria, frequent UTI. 11/17/2020, CT hematuria work-up was obtained which showed no evidence of urinary tract calculus or hydronephrosis.  Small left renal cyst. Incidental findings of 1.9 x 1.2 cm left lower lobe pulmonary nodule worrisome for malignancy. Patient was referred to establish care with oncology for further evaluation  .  Patient denies any shortness of breath, cough, hemoptysis, unintentional weight loss, fever or night sweats. She currently smokes 1 cigar/day.  She started smoking cigarettes at age of 38.  She quitted at age of 31 and restarted smoking cigar a few years ago.  Patient has a history of depression and is on Cymbalta.  Diabetes, hyperlipidemia, hypertension, peripheral artery disease Per patient she also has history of heart attack in 2011. History of right lower extremity BKA in 2021, 02/15/2020 history of pulmonary embolism involving segmental and subsegmental branches of right upper, middle, lower lobe pulmonary arteries.  She was put on on apixaban. Patient lives with her aunt.  12/07/2020, PET scan showed left lower lobe 1.3 x 1.8 cm lung nodule with SUV of 6.5.  Suspect early stage primary lung neoplasm.  No hilar or mediastinal lymphadenopathy activity.  # 12/23/2020 S/p CT guided biopsy of left lower lobe mass.  Pathology showed non-small cell carcinoma, favor adenocarcinoma.  Positive for TTF-1, negative for p40.     # 01/17/2021, patient underwent left lower lobectomy with lymph node dissection. Pathology  showed invasive poorly differentiated adenocarcinoma, 2.2 cm.  Tumor abuts pleura but visceral pleural surface is not involved by carcinoma.  LVI invasion is present, resection margins are negative for carcinoma.  10 lymph nodes were harvested and 3 lymph nodes were involved with carcinoma. pT1c pN2 Patient's case was discussed at [facility] oncology tumor board.  And consensus recommendation was adjuvant chemotherapy and sent tissue for molecular/PD-L1 testing.  Foundation one testing showed PD-L1 1,  No reportable alterations ERBB2 [id number]  # 02/28/2021 adjuvant carboplatin and Taxol  INTERVAL HISTORY [name] [name] is a 65 y.o. female who has above history reviewed by me today presents for follow up visit for Stage IIIA, hypertension, fatigue anemia Patient was seen by me yesterday.  She was hypotensive and nauseated.  Creatinine has also increased.  She got IV fluid today hydration and today she felt much better.  Urine was positive for leukocytes.  Urine culture showed multiple specimen need to be recollected.  I have called her and recommended her to start Cipro 250 mg twice daily for 3 days.  She has not picked up the prescription yet Denies any fever or chills, flank pain no nausea vomiting.  Review of Systems  Constitutional:  Negative for appetite change, chills, fatigue and fever.  HENT:   Negative for hearing loss and voice change.   Eyes:  Negative for eye problems.  Respiratory:  Negative for chest tightness and cough.   Cardiovascular:  Negative for chest pain.  Gastrointestinal:  Positive for nausea. Negative for abdominal distention, abdominal pain, blood in stool and diarrhea.  Endocrine: Negative for hot flashes.  Genitourinary:  Positive for dysuria. Negative for difficulty urinating and frequency.  Musculoskeletal:  Negative for arthralgias.  Skin:  Negative for itching and rash.  Neurological:  Negative for extremity weakness.  Hematological:  Negative for adenopathy.   Psychiatric/Behavioral:  Negative for confusion.    MEDICAL HISTORY:  Past Medical History:  Diagnosis Date   Anemia    Cancer ([place])    CHF (congestive heart failure) ([place])    02/16/20 HFrEF 20-25% in setting of acute DVT/PE, underlying CAD   CKD (chronic kidney disease)    Coronary artery disease    02/20/20: CTO pRCA with left-to-right collaterals, 50% mLAD, 80% OM1, medical therapy   Depression    Diabetes ([place])    Diabetes mellitus without complication ([place])    DVT (deep venous thrombosis) ([place]) 02/16/2020   RLE DVT proximal veins 02/16/20 Duplex   Hyperlipidemia    Hypertension    Myocardial infarction [facility]) 2009   Stress related per patient; NSTEMI 2012 100% RCA with left-to-right collaterals   PE (pulmonary thromboembolism) ([place]) 02/15/2020   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, RLE BKA   Peripheral artery disease ([place])     SURGICAL HISTORY: Past Surgical History:  Procedure Laterality Date   Right lower extremity BKA Right     SOCIAL HISTORY: Social History   Socioeconomic History   Marital status: Married    Spouse name: Not on file   Number of children: Not on file   Years of education: Not on file   Highest education level: Not on file  Occupational History   Not on file  Tobacco Use   Smoking status: Former    Packs/day: 0.25    Types: Cigars, Cigarettes    Quit date: 01/17/2021    Years since quitting: 0.2   Smokeless tobacco: Never  Vaping Use   Vaping Use: Never used  Substance and Sexual Activity   Alcohol use: Never   Drug use: Not Currently   Sexual activity: Not Currently  Other Topics Concern   Not on file  Social History Narrative   Not on file   Social Determinants of Health   Financial Resource Strain: Not on file  Food Insecurity: Not on file  Transportation Needs: Not on file  Physical Activity: Not on file  Stress: Not on file  Social Connections: Not on file  Intimate Partner Violence: Not on file    FAMILY  HISTORY: Family History  Problem Relation Age of Onset   Heart disease Mother    Diabetes Mother    Heart disease Father    Diabetes Father     ALLERGIES:  is allergic to lisinopril.  MEDICATIONS:  Current Outpatient Medications  Medication Sig Dispense Refill   ACCU-CHEK GUIDE test strip USE UP TP 4 TIMES A DAY AS DIRECTED 100 strip 1   Accu-Chek Softclix Lancets lancets SMARTSIG:Topical 1-4 Times Daily     aspirin EC 81 MG tablet Take 81 mg by mouth in the morning. Swallow whole.     atorvastatin (LIPITOR) 80 MG tablet Take 1 tablet (80 mg total) by mouth daily. 90 tablet 1   blood glucose meter kit and supplies KIT Dispense based on patient and insurance preference. Use up to four times daily as directed. 1 each 0   carboxymethylcellulose (REFRESH PLUS) 0.5 % SOLN Place 1 drop into both eyes in the morning and at bedtime.     diphenhydrAMINE (BENADRYL) 25 MG tablet Take 25 mg by mouth at bedtime.     docusate sodium (COLACE) 100 MG capsule Take 1 capsule (  100 mg total) by mouth 2 (two) times daily. 60 capsule 1   DULoxetine (CYMBALTA) 20 MG capsule TAKE 2 CAPSULES (40 MG TOTAL) BY MOUTH IN THE MORNING AND AT BEDTIME. 180 capsule 2   gabapentin (NEURONTIN) 100 MG capsule Take 1 capsule (100 mg total) by mouth 2 (two) times daily. 60 capsule 5   HUMALOG KWIKPEN 100 UNIT/ML KwikPen Inject 8 Units into the skin 3 (three) times daily after meals.     hydrALAZINE (APRESOLINE) 100 MG tablet Take 1 tablet (100 mg total) by mouth 3 (three) times daily. 270 tablet 0   isosorbide dinitrate (ISORDIL) 30 MG tablet TAKE 1 TABLET (30 MG TOTAL) BY MOUTH IN THE MORNING, AT NOON, AND AT BEDTIME. 90 tablet 0   JARDIANCE 10 MG TABS tablet TAKE 1 TABLET BY MOUTH EVERY DAY 90 tablet 0   ketoconazole (NIZORAL) 2 % cream Apply 1 application topically in the morning and at bedtime. Applied to feet 60 g 1   LANTUS SOLOSTAR 100 UNIT/ML Solostar Pen 16 Units at bedtime.     metFORMIN (GLUCOPHAGE-XR) 500 MG 24  hr tablet TAKE 1 TABLET BY MOUTH TWICE A DAY 180 tablet 0   Multiple Vitamin (MULTIVITAMIN WITH MINERALS) TABS tablet Take 1 tablet by mouth in the morning. Adults 50+     ondansetron (ZOFRAN) 8 MG tablet Take 1 tablet (8 mg total) by mouth 2 (two) times daily as needed (Nausea or vomiting). Start if needed on the third day after cisplatin. 30 tablet 1   potassium chloride (KLOR-CON) 10 MEQ tablet Take 1 tablet (10 mEq total) by mouth daily. 14 tablet 0   prochlorperazine (COMPAZINE) 10 MG tablet Take 1 tablet (10 mg total) by mouth every 6 (six) hours as needed (Nausea or vomiting). 30 tablet 1   spironolactone (ALDACTONE) 25 MG tablet TAKE 1 TABLET (25 MG TOTAL) BY MOUTH DAILY. 90 tablet 0   ciprofloxacin (CIPRO) 250 MG tablet Take 1 tablet (250 mg total) by mouth 2 (two) times daily. (Patient not taking: Reported on 05/04/2021) 6 tablet 0   metoprolol succinate (TOPROL-XL) 50 MG 24 hr tablet Take 1 tablet (50 mg total) by mouth daily. Take with or immediately following a meal. (Patient not taking: Reported on 05/04/2021) 90 tablet 1   No current facility-administered medications for this visit.     PHYSICAL EXAMINATION: ECOG PERFORMANCE STATUS: 1 - Symptomatic but completely ambulatory Vitals:   05/04/21 1350  BP: (!) 103/52  Pulse: 87  Resp: 18  Temp: 98.2 F (36.8 C)   Filed Weights   05/04/21 1350  Weight: 111 lb 3.2 oz (50.4 kg)    Physical Exam Constitutional:      General: She is not in acute distress.    Appearance: She is ill-appearing.  HENT:     Head: Normocephalic and atraumatic.  Eyes:     General: No scleral icterus. Cardiovascular:     Rate and Rhythm: Normal rate and regular rhythm.     Heart sounds: Normal heart sounds.  Pulmonary:     Effort: Pulmonary effort is normal. No respiratory distress.     Breath sounds: No wheezing.     Comments: Absent breath sounds left basilar Abdominal:     General: Bowel sounds are normal. There is no distension.      Palpations: Abdomen is soft.  Musculoskeletal:        General: No deformity. Normal range of motion.     Cervical back: Normal range of motion and neck  supple.     Comments: Right lower extremity BKA  Skin:    General: Skin is warm and dry.     Findings: No erythema or rash.  Neurological:     Mental Status: She is alert and oriented to person, place, and time. Mental status is at baseline.     Cranial Nerves: No cranial nerve deficit.     Coordination: Coordination normal.  Psychiatric:        Mood and Affect: Mood normal.    LABORATORY DATA:  I have reviewed the data as listed Lab Results  Component Value Date   WBC 4.5 05/04/2021   HGB 8.3 (L) 05/04/2021   HCT 26.6 (L) 05/04/2021   MCV 96.0 05/04/2021   PLT 182 05/04/2021   Recent Labs    08/10/20 1658 11/17/20 1149 04/12/21 0809 05/03/21 0848 05/04/21 1335  NA 136   < > 139 139 139  K 4.6   < > 3.4* 4.4 4.6  CL 101   < > 108 106 107  CO2 17*   < > '24 23 23  ' GLUCOSE 485*   < > 203* 133* 186*  BUN 39*   < > 32* 31* 32*  CREATININE 1.18*   < > 1.10* 1.51* 1.66*  CALCIUM 9.2   < > 8.4* 8.7* 8.2*  GFRNONAA 50*   < > 56* 39* 34*  GFRAA 57*  --   --   --   --   PROT  --    < > 6.5 6.4* 6.1*  ALBUMIN 3.2*   < > 3.2* 3.1* 3.1*  AST  --    < > 33 25 27  ALT  --    < > '25 19 19  ' ALKPHOS  --    < > 136* 117 112  BILITOT  --    < > 0.4 0.1* 0.1*   < > = values in this interval not displayed.    Iron/TIBC/Ferritin/ %Sat    Component Value Date/Time   IRON 44 12/30/2020 1437   TIBC 347 12/30/2020 1437   FERRITIN 15 12/30/2020 1437   IRONPCTSAT 13 12/30/2020 1437      RADIOGRAPHIC STUDIES: I have personally reviewed the radiological images as listed and agreed with the findings in the report. DG Chest 2 View  Result Date: 04/26/2021 CLINICAL DATA:  Non-small cell lung cancer. History of left lung surgery. EXAM: CHEST - 2 VIEW COMPARISON:  Radiographs 02/09/2021, 01/22/2021 and 01/21/2021. FINDINGS: There is  stable volume loss in the left hemithorax with elevation of the left hemidiaphragm and left basilar pleuroparenchymal scarring related to previous partial left pneumonectomy. The right lung is clear. The heart size and mediastinal contours are stable. No evidence of pneumothorax. The bones appear unremarkable. IMPRESSION: Stable postoperative chest.  No acute cardiopulmonary process. Electronically Signed   By: Richardean Sale M.D.   On: 04/26/2021 15:35      ASSESSMENT & PLAN:  1. Dysuria   2. Non-small cell cancer of left lung ([place])   3. Acute cystitis without hematuria   4. AKI (acute kidney injury) ([place])   5. Moderate protein-calorie malnutrition ([place])   6. Hypotension, unspecified hypotension type    Cancer Staging Non-small cell lung cancer [facility]) Staging form: Lung, AJCC 8th Edition - Pathologic stage from 01/28/2021: Stage IIIA (pT1c, pN2, cM0) - Signed by Earlie Server, MD on 02/17/2021  #Stage IIIA left lung non-small cell lung cancer-.  Poorly differentiated adenocarcinoma.  Status post left lower lobectomy and  lymph node dissection pT1c pN2 [pathology addendum changed to N2]  She has negative surgical margin, no clear benefit of postoperative RT.  Hold off chemotherapy due to hypotension/nausea/fatigue  #Hypotension, Hypotension has improved.  Still soft.  Continue hold BP meds.  #Dysuria, UA is positive for leukocytes.  Recommend patient to start Cipro 250 mg twice daily.  #AKI on CKD, IV fluid normal saline 1 L today today. #Normocytic anemia, probably secondary to chronic kidney disease.   Hemoglobin stable, slightly increased to 8.3.  Hold off blood transfusion today.  #Malnutrition, continue nutrition supplements.   All questions were answered. The patient knows to call the clinic with any problems questions or concerns. Patient was recommended to go to emergency room if her symptoms get worse. cc [name], MD   Return of visit:  Lab+/- IV fluid tomorrow Lab NP  +/- IV fluids on 05/09/21 and further adjustment of BP meds Lab MD CarboTaxol on 05/18/2021.    Earlie Server, MD, PhD  05/04/2021

## 2021-05-04 NOTE — Progress Notes (Signed)
Patient here for follow up. Pt reports that she is feeling a lot better today.

## 2021-05-05 ENCOUNTER — Inpatient Hospital Stay: Payer: Medicaid Other

## 2021-05-05 ENCOUNTER — Other Ambulatory Visit: Payer: Self-pay

## 2021-05-05 VITALS — BP 143/75 | HR 87 | Temp 97.0°F | Resp 16

## 2021-05-05 DIAGNOSIS — I10 Essential (primary) hypertension: Secondary | ICD-10-CM | POA: Diagnosis not present

## 2021-05-05 DIAGNOSIS — C3432 Malignant neoplasm of lower lobe, left bronchus or lung: Secondary | ICD-10-CM | POA: Diagnosis not present

## 2021-05-05 DIAGNOSIS — C3492 Malignant neoplasm of unspecified part of left bronchus or lung: Secondary | ICD-10-CM

## 2021-05-05 DIAGNOSIS — Z833 Family history of diabetes mellitus: Secondary | ICD-10-CM | POA: Diagnosis not present

## 2021-05-05 DIAGNOSIS — N179 Acute kidney failure, unspecified: Secondary | ICD-10-CM | POA: Diagnosis not present

## 2021-05-05 DIAGNOSIS — R300 Dysuria: Secondary | ICD-10-CM | POA: Diagnosis not present

## 2021-05-05 DIAGNOSIS — R197 Diarrhea, unspecified: Secondary | ICD-10-CM | POA: Diagnosis not present

## 2021-05-05 DIAGNOSIS — I252 Old myocardial infarction: Secondary | ICD-10-CM | POA: Diagnosis not present

## 2021-05-05 DIAGNOSIS — Z8249 Family history of ischemic heart disease and other diseases of the circulatory system: Secondary | ICD-10-CM | POA: Diagnosis not present

## 2021-05-05 DIAGNOSIS — Z87891 Personal history of nicotine dependence: Secondary | ICD-10-CM | POA: Diagnosis not present

## 2021-05-05 DIAGNOSIS — N1832 Chronic kidney disease, stage 3b: Secondary | ICD-10-CM | POA: Diagnosis not present

## 2021-05-05 DIAGNOSIS — E119 Type 2 diabetes mellitus without complications: Secondary | ICD-10-CM | POA: Diagnosis not present

## 2021-05-05 DIAGNOSIS — E785 Hyperlipidemia, unspecified: Secondary | ICD-10-CM | POA: Diagnosis not present

## 2021-05-05 DIAGNOSIS — Z79899 Other long term (current) drug therapy: Secondary | ICD-10-CM | POA: Diagnosis not present

## 2021-05-05 DIAGNOSIS — R3129 Other microscopic hematuria: Secondary | ICD-10-CM | POA: Diagnosis not present

## 2021-05-05 DIAGNOSIS — R110 Nausea: Secondary | ICD-10-CM | POA: Diagnosis not present

## 2021-05-05 LAB — BASIC METABOLIC PANEL
Anion gap: 8 (ref 5–15)
BUN: 29 mg/dL — ABNORMAL HIGH (ref 8–23)
CO2: 23 mmol/L (ref 22–32)
Calcium: 8.3 mg/dL — ABNORMAL LOW (ref 8.9–10.3)
Chloride: 107 mmol/L (ref 98–111)
Creatinine, Ser: 1.45 mg/dL — ABNORMAL HIGH (ref 0.44–1.00)
GFR, Estimated: 41 mL/min — ABNORMAL LOW (ref >60)
Glucose, Bld: 85 mg/dL (ref 70–99)
Potassium: 4.1 mmol/L (ref 3.5–5.1)
Sodium: 138 mmol/L (ref 135–145)

## 2021-05-05 LAB — URINE CULTURE: Culture: <10000 — AB

## 2021-05-05 MED ORDER — SODIUM CHLORIDE 0.9 % IV SOLN
Freq: Once | INTRAVENOUS | Status: AC
  Filled 2021-05-05: qty 250

## 2021-05-05 NOTE — Progress Notes (Signed)
Per Dr. [name], HOLD hydralazine. Restart isosorbide and metoprolol. Notified patient.

## 2021-05-05 NOTE — Patient Instructions (Addendum)
Per Dr. [name]: Please restart isosorbide and metoprolol. Please hold hydralazine.

## 2021-05-09 ENCOUNTER — Inpatient Hospital Stay: Payer: Medicaid Other

## 2021-05-09 ENCOUNTER — Encounter: Payer: Self-pay | Admitting: Oncology

## 2021-05-09 ENCOUNTER — Inpatient Hospital Stay (HOSPITAL_BASED_OUTPATIENT_CLINIC_OR_DEPARTMENT_OTHER): Payer: Medicaid Other | Admitting: Oncology

## 2021-05-09 ENCOUNTER — Ambulatory Visit: Payer: Medicaid Other | Admitting: Nurse Practitioner

## 2021-05-09 ENCOUNTER — Other Ambulatory Visit: Payer: Medicaid Other

## 2021-05-09 ENCOUNTER — Other Ambulatory Visit: Payer: Self-pay

## 2021-05-09 ENCOUNTER — Ambulatory Visit: Payer: Medicaid Other

## 2021-05-09 VITALS — BP 104/72 | HR 82 | Temp 97.6°F | Resp 17 | Wt 115.0 lb

## 2021-05-09 VITALS — BP 128/82 | HR 82

## 2021-05-09 DIAGNOSIS — C3492 Malignant neoplasm of unspecified part of left bronchus or lung: Secondary | ICD-10-CM | POA: Diagnosis not present

## 2021-05-09 DIAGNOSIS — I252 Old myocardial infarction: Secondary | ICD-10-CM | POA: Diagnosis not present

## 2021-05-09 DIAGNOSIS — N179 Acute kidney failure, unspecified: Secondary | ICD-10-CM | POA: Diagnosis not present

## 2021-05-09 DIAGNOSIS — E860 Dehydration: Secondary | ICD-10-CM

## 2021-05-09 DIAGNOSIS — R300 Dysuria: Secondary | ICD-10-CM | POA: Diagnosis not present

## 2021-05-09 DIAGNOSIS — E119 Type 2 diabetes mellitus without complications: Secondary | ICD-10-CM | POA: Diagnosis not present

## 2021-05-09 DIAGNOSIS — Z79899 Other long term (current) drug therapy: Secondary | ICD-10-CM | POA: Diagnosis not present

## 2021-05-09 DIAGNOSIS — R3129 Other microscopic hematuria: Secondary | ICD-10-CM | POA: Diagnosis not present

## 2021-05-09 DIAGNOSIS — Z833 Family history of diabetes mellitus: Secondary | ICD-10-CM | POA: Diagnosis not present

## 2021-05-09 DIAGNOSIS — Z8249 Family history of ischemic heart disease and other diseases of the circulatory system: Secondary | ICD-10-CM | POA: Diagnosis not present

## 2021-05-09 DIAGNOSIS — E785 Hyperlipidemia, unspecified: Secondary | ICD-10-CM | POA: Diagnosis not present

## 2021-05-09 DIAGNOSIS — I10 Essential (primary) hypertension: Secondary | ICD-10-CM | POA: Diagnosis not present

## 2021-05-09 DIAGNOSIS — R110 Nausea: Secondary | ICD-10-CM | POA: Diagnosis not present

## 2021-05-09 DIAGNOSIS — C3432 Malignant neoplasm of lower lobe, left bronchus or lung: Secondary | ICD-10-CM | POA: Diagnosis not present

## 2021-05-09 DIAGNOSIS — Z87891 Personal history of nicotine dependence: Secondary | ICD-10-CM | POA: Diagnosis not present

## 2021-05-09 DIAGNOSIS — I959 Hypotension, unspecified: Secondary | ICD-10-CM

## 2021-05-09 DIAGNOSIS — N1832 Chronic kidney disease, stage 3b: Secondary | ICD-10-CM | POA: Diagnosis not present

## 2021-05-09 DIAGNOSIS — R197 Diarrhea, unspecified: Secondary | ICD-10-CM | POA: Diagnosis not present

## 2021-05-09 LAB — CBC WITH DIFFERENTIAL/PLATELET
Abs Immature Granulocytes: 0.01 10*3/uL (ref 0.00–0.07)
Basophils Absolute: 0 10*3/uL (ref 0.0–0.1)
Basophils Relative: 0 %
Eosinophils Absolute: 0.2 10*3/uL (ref 0.0–0.5)
Eosinophils Relative: 3 %
HCT: 30.5 % — ABNORMAL LOW (ref 36.0–46.0)
Hemoglobin: 9.6 g/dL — ABNORMAL LOW (ref 12.0–15.0)
Immature Granulocytes: 0 %
Lymphocytes Relative: 25 %
Lymphs Abs: 1.2 10*3/uL (ref 0.7–4.0)
MCH: 29.9 pg (ref 26.0–34.0)
MCHC: 31.5 g/dL (ref 30.0–36.0)
MCV: 95 fL (ref 80.0–100.0)
Monocytes Absolute: 0.4 10*3/uL (ref 0.1–1.0)
Monocytes Relative: 9 %
Neutro Abs: 2.9 10*3/uL (ref 1.7–7.7)
Neutrophils Relative %: 63 %
Platelets: 359 10*3/uL (ref 150–400)
RBC: 3.21 MIL/uL — ABNORMAL LOW (ref 3.87–5.11)
RDW: 18.6 % — ABNORMAL HIGH (ref 11.5–15.5)
WBC: 4.7 10*3/uL (ref 4.0–10.5)
nRBC: 0 % (ref 0.0–0.2)

## 2021-05-09 LAB — COMPREHENSIVE METABOLIC PANEL
ALT: 23 U/L (ref 0–44)
AST: 31 U/L (ref 15–41)
Albumin: 3.5 g/dL (ref 3.5–5.0)
Alkaline Phosphatase: 133 U/L — ABNORMAL HIGH (ref 38–126)
Anion gap: 7 (ref 5–15)
BUN: 22 mg/dL (ref 8–23)
CO2: 26 mmol/L (ref 22–32)
Calcium: 8.8 mg/dL — ABNORMAL LOW (ref 8.9–10.3)
Chloride: 103 mmol/L (ref 98–111)
Creatinine, Ser: 1.13 mg/dL — ABNORMAL HIGH (ref 0.44–1.00)
GFR, Estimated: 55 mL/min — ABNORMAL LOW (ref >60)
Glucose, Bld: 139 mg/dL — ABNORMAL HIGH (ref 70–99)
Potassium: 4.5 mmol/L (ref 3.5–5.1)
Sodium: 136 mmol/L (ref 135–145)
Total Bilirubin: 0.2 mg/dL — ABNORMAL LOW (ref 0.3–1.2)
Total Protein: 7.2 g/dL (ref 6.5–8.1)

## 2021-05-09 MED ORDER — SODIUM CHLORIDE 0.9 % IV SOLN
INTRAVENOUS | Status: DC
  Filled 2021-05-09 (×2): qty 250

## 2021-05-09 NOTE — Patient Instructions (Signed)

## 2021-05-09 NOTE — Progress Notes (Signed)
Patient tolerated IVF infusion well today, no concerns voiced. Patient discharged. Aware of appointments. Stable.

## 2021-05-09 NOTE — Progress Notes (Signed)
Patient here for oncology follow-up appointment,  concerns of lightheadness

## 2021-05-09 NOTE — Progress Notes (Signed)
Hematology/Oncology progress note [phone] [phone]   Patient Care Team: [name], MD as PCP - General (Family Medicine) Earlie Server, MD as Consulting Physician (Hematology and Oncology)  REFERRING PROVIDER: [name], MD  CHIEF COMPLAINTS/REASON FOR VISIT:  Follow up for stage IIIA lung cancer  HISTORY OF PRESENTING ILLNESS:  Patient was recently seen by urology Dr. Candiss Norse ski for evaluation of microscopic hematuria, frequent UTI. 11/17/2020, CT hematuria work-up was obtained which showed no evidence of urinary tract calculus or hydronephrosis.  Small left renal cyst. Incidental findings of 1.9 x 1.2 cm left lower lobe pulmonary nodule worrisome for malignancy. Patient was referred to establish care with oncology for further evaluation  .  Patient denies any shortness of breath, cough, hemoptysis, unintentional weight loss, fever or night sweats. She currently smokes 1 cigar/day.  She started smoking cigarettes at age of 76.  She quitted at age of 11 and restarted smoking cigar a few years ago.  Patient has a history of depression and is on Cymbalta.  Diabetes, hyperlipidemia, hypertension, peripheral artery disease Per patient she also has history of heart attack in 2011. History of right lower extremity BKA in 2021, 02/15/2020 history of pulmonary embolism involving segmental and subsegmental branches of right upper, middle, lower lobe pulmonary arteries.  She was put on on apixaban. Patient lives with her aunt.  12/07/2020, PET scan showed left lower lobe 1.3 x 1.8 cm lung nodule with SUV of 6.5.  Suspect early stage primary lung neoplasm.  No hilar or mediastinal lymphadenopathy activity.  # 12/23/2020 S/p CT guided biopsy of left lower lobe mass.  Pathology showed non-small cell carcinoma, favor adenocarcinoma.  Positive for TTF-1, negative for p40.     # 01/17/2021, patient underwent left lower lobectomy with lymph node dissection. Pathology  showed invasive poorly differentiated adenocarcinoma, 2.2 cm.  Tumor abuts pleura but visceral pleural surface is not involved by carcinoma.  LVI invasion is present, resection margins are negative for carcinoma.  10 lymph nodes were harvested and 3 lymph nodes were involved with carcinoma. pT1c pN2 Patient's case was discussed at [facility] oncology tumor board.  And consensus recommendation was adjuvant chemotherapy and sent tissue for molecular/PD-L1 testing.  Foundation one testing showed PD-L1 1,  No reportable alterations  ERBB2 [id number]  # 02/28/2021 adjuvant carboplatin and Taxol  INTERVAL HISTORY like I never left. [name] [name] is a 65 year old female with above history who presents today for follow-up for stage IIIa lung cancer.  She is status post 3 cycles of CarboTaxol last given on 04/12/2021.  Has required intermittent IV fluids due to hypotension and nausea.  She was recently treated for a urinary tract infection with Cipro.  Completed 3 days of antibiotics.  Reports eating and drinking better.  States she continues to not have an appetite.  Eats chicken noodle and potato soup daily.  Drinks Glucerna.  Has gained a few pounds.  Denies any nausea.    Review of Systems  Constitutional:  Positive for fatigue. Negative for appetite change, fever and unexpected weight change.  HENT:   Negative for nosebleeds, sore throat and trouble swallowing.   Eyes: Negative.   Respiratory: Negative.  Negative for cough, shortness of breath and wheezing.   Cardiovascular: Negative.  Negative for chest pain and leg swelling.  Gastrointestinal:  Negative for abdominal pain, blood in stool, constipation, diarrhea, nausea and vomiting.  Endocrine: Negative.   Genitourinary: Negative.  Negative for bladder incontinence, hematuria and nocturia.   Musculoskeletal: Negative.  Negative for back pain and flank pain.  Skin: Negative.   Neurological: Negative.  Negative for dizziness, headaches,  light-headedness and numbness.  Hematological: Negative.   Psychiatric/Behavioral: Negative.  Negative for confusion. The patient is not nervous/anxious.    MEDICAL HISTORY:  Past Medical History:  Diagnosis Date   Anemia    Cancer ([place])    CHF (congestive heart failure) ([place])    02/16/20 HFrEF 20-25% in setting of acute DVT/PE, underlying CAD   CKD (chronic kidney disease)    Coronary artery disease    02/20/20: CTO pRCA with left-to-right collaterals, 50% mLAD, 80% OM1, medical therapy   Depression    Diabetes ([place])    Diabetes mellitus without complication ([place])    DVT (deep venous thrombosis) ([place]) 02/16/2020   RLE DVT proximal veins 02/16/20 Duplex   Hyperlipidemia    Hypertension    Myocardial infarction [facility]) 2009   Stress related per patient; NSTEMI 2012 100% RCA with left-to-right collaterals   PE (pulmonary thromboembolism) ([place]) 02/15/2020   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, RLE BKA   Peripheral artery disease ([place])     SURGICAL HISTORY: Past Surgical History:  Procedure Laterality Date   Right lower extremity BKA Right     SOCIAL HISTORY: Social History   Socioeconomic History   Marital status: Married    Spouse name: Not on file   Number of children: Not on file   Years of education: Not on file   Highest education level: Not on file  Occupational History   Not on file  Tobacco Use   Smoking status: Former    Packs/day: 0.25    Types: Cigars, Cigarettes    Quit date: 01/17/2021    Years since quitting: 0.3   Smokeless tobacco: Never  Vaping Use   Vaping Use: Never used  Substance and Sexual Activity   Alcohol use: Never   Drug use: Not Currently   Sexual activity: Not Currently  Other Topics Concern   Not on file  Social History Narrative   Not on file   Social Determinants of Health   Financial Resource Strain: Not on file  Food Insecurity: Not on file  Transportation Needs: Not on file  Physical Activity: Not on file   Stress: Not on file  Social Connections: Not on file  Intimate Partner Violence: Not on file    FAMILY HISTORY: Family History  Problem Relation Age of Onset   Heart disease Mother    Diabetes Mother    Heart disease Father    Diabetes Father     ALLERGIES:  is allergic to lisinopril.  MEDICATIONS:  Current Outpatient Medications  Medication Sig Dispense Refill   ACCU-CHEK GUIDE test strip USE UP TP 4 TIMES A DAY AS DIRECTED 100 strip 1   Accu-Chek Softclix Lancets lancets SMARTSIG:Topical 1-4 Times Daily     aspirin EC 81 MG tablet Take 81 mg by mouth in the morning. Swallow whole.     atorvastatin (LIPITOR) 80 MG tablet Take 1 tablet (80 mg total) by mouth daily. 90 tablet 1   blood glucose meter kit and supplies KIT Dispense based on patient and insurance preference. Use up to four times daily as directed. 1 each 0   carboxymethylcellulose (REFRESH PLUS) 0.5 % SOLN Place 1 drop into both eyes in the morning and at bedtime.     ciprofloxacin (CIPRO) 250 MG tablet Take 1 tablet (250 mg total) by mouth 2 (two) times daily. (Patient not  taking: Reported on 05/04/2021) 6 tablet 0   diphenhydrAMINE (BENADRYL) 25 MG tablet Take 25 mg by mouth at bedtime.     docusate sodium (COLACE) 100 MG capsule Take 1 capsule (100 mg total) by mouth 2 (two) times daily. 60 capsule 1   DULoxetine (CYMBALTA) 20 MG capsule TAKE 2 CAPSULES (40 MG TOTAL) BY MOUTH IN THE MORNING AND AT BEDTIME. 180 capsule 2   gabapentin (NEURONTIN) 100 MG capsule Take 1 capsule (100 mg total) by mouth 2 (two) times daily. 60 capsule 5   HUMALOG KWIKPEN 100 UNIT/ML KwikPen Inject 8 Units into the skin 3 (three) times daily after meals.     hydrALAZINE (APRESOLINE) 100 MG tablet Take 1 tablet (100 mg total) by mouth 3 (three) times daily. 270 tablet 0   isosorbide dinitrate (ISORDIL) 30 MG tablet TAKE 1 TABLET (30 MG TOTAL) BY MOUTH IN THE MORNING, AT NOON, AND AT BEDTIME. 90 tablet 0   JARDIANCE 10 MG TABS tablet TAKE 1  TABLET BY MOUTH EVERY DAY 90 tablet 0   ketoconazole (NIZORAL) 2 % cream Apply 1 application topically in the morning and at bedtime. Applied to feet 60 g 1   LANTUS SOLOSTAR 100 UNIT/ML Solostar Pen 16 Units at bedtime.     metFORMIN (GLUCOPHAGE-XR) 500 MG 24 hr tablet TAKE 1 TABLET BY MOUTH TWICE A DAY 180 tablet 0   metoprolol succinate (TOPROL-XL) 50 MG 24 hr tablet Take 1 tablet (50 mg total) by mouth daily. Take with or immediately following a meal. (Patient not taking: Reported on 05/04/2021) 90 tablet 1   Multiple Vitamin (MULTIVITAMIN WITH MINERALS) TABS tablet Take 1 tablet by mouth in the morning. Adults 50+     ondansetron (ZOFRAN) 8 MG tablet Take 1 tablet (8 mg total) by mouth 2 (two) times daily as needed (Nausea or vomiting). Start if needed on the third day after cisplatin. 30 tablet 1   potassium chloride (KLOR-CON) 10 MEQ tablet Take 1 tablet (10 mEq total) by mouth daily. 14 tablet 0   prochlorperazine (COMPAZINE) 10 MG tablet Take 1 tablet (10 mg total) by mouth every 6 (six) hours as needed (Nausea or vomiting). 30 tablet 1   spironolactone (ALDACTONE) 25 MG tablet TAKE 1 TABLET (25 MG TOTAL) BY MOUTH DAILY. 90 tablet 0   No current facility-administered medications for this visit.     PHYSICAL EXAMINATION: ECOG PERFORMANCE STATUS: 1 - Symptomatic but completely ambulatory There were no vitals filed for this visit.  There were no vitals filed for this visit.   Physical Exam Constitutional:      Appearance: Normal appearance.  HENT:     Head: Normocephalic and atraumatic.  Eyes:     Pupils: Pupils are equal, round, and reactive to light.  Cardiovascular:     Rate and Rhythm: Normal rate and regular rhythm.     Heart sounds: Normal heart sounds. No murmur heard. Pulmonary:     Effort: Pulmonary effort is normal.     Breath sounds: Normal breath sounds. No wheezing.  Abdominal:     General: Bowel sounds are normal. There is no distension.     Palpations:  Abdomen is soft.     Tenderness: There is no abdominal tenderness.  Musculoskeletal:        General: Normal range of motion.     Cervical back: Normal range of motion.  Skin:    General: Skin is warm and dry.     Findings: No rash.  Neurological:  Mental Status: She is alert and oriented to person, place, and time.  Psychiatric:        Judgment: Judgment normal.    LABORATORY DATA:  I have reviewed the data as listed Lab Results  Component Value Date   WBC 4.7 05/09/2021   HGB 9.6 (L) 05/09/2021   HCT 30.5 (L) 05/09/2021   MCV 95.0 05/09/2021   PLT 359 05/09/2021   Recent Labs    08/10/20 1658 11/17/20 1149 04/12/21 0809 05/03/21 0848 05/04/21 1335 05/05/21 0859  NA 136   < > 139 139 139 138  K 4.6   < > 3.4* 4.4 4.6 4.1  CL 101   < > 108 106 107 107  CO2 17*   < > _0 GLUCOSE 485*   < > 203* 133* 186* 85  BUN 39*   < > 32* 31* 32* 29*  CREATININE 1.18*   < > 1.10* 1.51* 1.66* 1.45*  CALCIUM 9.2   < > 8.4* 8.7* 8.2* 8.3*  GFRNONAA 50*   < > 56* 39* 34* 41*  GFRAA 57*  --   --   --   --   --   PROT  --    < > 6.5 6.4* 6.1*  --   ALBUMIN 3.2*   < > 3.2* 3.1* 3.1*  --   AST  --    < > 33 25 27  --   ALT  --    < > _1 --   ALKPHOS  --    < > 136* 117 112  --   BILITOT  --    < > 0.4 0.1* 0.1*  --    < > = values in this interval not displayed.    Iron/TIBC/Ferritin/ %Sat    Component Value Date/Time   IRON 44 12/30/2020 1437   TIBC 347 12/30/2020 1437   FERRITIN 15 12/30/2020 1437   IRONPCTSAT 13 12/30/2020 1437      RADIOGRAPHIC STUDIES: I have personally reviewed the radiological images as listed and agreed with the findings in the report. DG Chest 2 View  Result Date: 04/26/2021 CLINICAL DATA:  Non-small cell lung cancer. History of left lung surgery. EXAM: CHEST - 2 VIEW COMPARISON:  Radiographs 02/09/2021, 01/22/2021 and 01/21/2021. FINDINGS: There is stable volume loss in the left hemithorax with elevation of the left hemidiaphragm  and left basilar pleuroparenchymal scarring related to previous partial left pneumonectomy. The right lung is clear. The heart size and mediastinal contours are stable. No evidence of pneumothorax. The bones appear unremarkable. IMPRESSION: Stable postoperative chest.  No acute cardiopulmonary process. Electronically Signed   By: Richardean Sale M.D.   On: 04/26/2021 15:35      ASSESSMENT & PLAN:  No diagnosis found.   Cancer Staging  Non-small cell lung cancer [facility]) Staging form: Lung, AJCC 8th Edition - Pathologic stage from 01/28/2021: Stage IIIA (pT1c, pN2, cM0) - Signed by Earlie Server, MD on 02/17/2021  Stage IIIA left lung non-small cell lung cancer- Status post left lower lobectomy and lymph node dissection. Negative surgical margin.  No benefit of postoperative radiation therapy. Received 3 cycles of carbo/Taxol last given on 04/12/2021.  This is currently on hold due to poor tolerance. She is scheduled to restart chemo next week. Alkaline phosphatase slightly elevated today.  Will monitor.  Hypotension- Secondary to dehydration. Receives intermittent IV fluids. Currently not taking blood pressure medicines. Blood pressure still soft at 104/72.  Continue  to hold hydralazine at this time.  Continue metoprolol and isosorbide. Labs from today show continued improvement of her creatinine 1.13.  Patient would like IV fluids today.  We proceed with 500 ml NS.   Dysuria- Completed 3 days of Cipro to 50 mg twice daily for possible UTI.  No new symptoms.  Anemia- Secondary to chemotherapy treatment. Improvement noted since we have been holding chemo for about a month now. Hemoglobin 9.6 today.  Differential is normal.  Disposition- Return to clinic as scheduled on 05/19/2021 for labs, see Dr. [name] and to restart carbo/Taxol.  I spent 25 minutes dedicated to the care of this patient (face-to-face and non-face-to-face) on the date of the encounter to include what is described in the  assessment and plan.  All questions were answered. The patient knows to call the clinic with any problems questions or concerns. Patient was recommended to go to emergency room if her symptoms get worse. cc [name], MD   Faythe Casa, NP 05/09/2021 10:41 AM

## 2021-05-19 ENCOUNTER — Inpatient Hospital Stay: Payer: Medicaid Other

## 2021-05-19 ENCOUNTER — Other Ambulatory Visit: Payer: Self-pay

## 2021-05-19 ENCOUNTER — Encounter: Payer: Self-pay | Admitting: Oncology

## 2021-05-19 ENCOUNTER — Inpatient Hospital Stay: Payer: Medicaid Other | Attending: Oncology

## 2021-05-19 ENCOUNTER — Inpatient Hospital Stay (HOSPITAL_BASED_OUTPATIENT_CLINIC_OR_DEPARTMENT_OTHER): Payer: Medicaid Other | Admitting: Oncology

## 2021-05-19 VITALS — BP 157/88 | HR 79 | Temp 96.5°F | Wt 114.0 lb

## 2021-05-19 DIAGNOSIS — Z833 Family history of diabetes mellitus: Secondary | ICD-10-CM | POA: Insufficient documentation

## 2021-05-19 DIAGNOSIS — Z87440 Personal history of urinary (tract) infections: Secondary | ICD-10-CM | POA: Insufficient documentation

## 2021-05-19 DIAGNOSIS — I130 Hypertensive heart and chronic kidney disease with heart failure and stage 1 through stage 4 chronic kidney disease, or unspecified chronic kidney disease: Secondary | ICD-10-CM | POA: Diagnosis not present

## 2021-05-19 DIAGNOSIS — N1832 Chronic kidney disease, stage 3b: Secondary | ICD-10-CM | POA: Insufficient documentation

## 2021-05-19 DIAGNOSIS — E1122 Type 2 diabetes mellitus with diabetic chronic kidney disease: Secondary | ICD-10-CM | POA: Insufficient documentation

## 2021-05-19 DIAGNOSIS — Z888 Allergy status to other drugs, medicaments and biological substances status: Secondary | ICD-10-CM | POA: Insufficient documentation

## 2021-05-19 DIAGNOSIS — Z5111 Encounter for antineoplastic chemotherapy: Secondary | ICD-10-CM | POA: Insufficient documentation

## 2021-05-19 DIAGNOSIS — R3129 Other microscopic hematuria: Secondary | ICD-10-CM | POA: Diagnosis not present

## 2021-05-19 DIAGNOSIS — Z8249 Family history of ischemic heart disease and other diseases of the circulatory system: Secondary | ICD-10-CM | POA: Diagnosis not present

## 2021-05-19 DIAGNOSIS — Z79899 Other long term (current) drug therapy: Secondary | ICD-10-CM | POA: Diagnosis not present

## 2021-05-19 DIAGNOSIS — F32A Depression, unspecified: Secondary | ICD-10-CM | POA: Insufficient documentation

## 2021-05-19 DIAGNOSIS — D649 Anemia, unspecified: Secondary | ICD-10-CM | POA: Insufficient documentation

## 2021-05-19 DIAGNOSIS — Z89511 Acquired absence of right leg below knee: Secondary | ICD-10-CM | POA: Diagnosis not present

## 2021-05-19 DIAGNOSIS — Z86718 Personal history of other venous thrombosis and embolism: Secondary | ICD-10-CM | POA: Diagnosis not present

## 2021-05-19 DIAGNOSIS — E785 Hyperlipidemia, unspecified: Secondary | ICD-10-CM | POA: Insufficient documentation

## 2021-05-19 DIAGNOSIS — C3492 Malignant neoplasm of unspecified part of left bronchus or lung: Secondary | ICD-10-CM

## 2021-05-19 DIAGNOSIS — Z86711 Personal history of pulmonary embolism: Secondary | ICD-10-CM | POA: Insufficient documentation

## 2021-05-19 DIAGNOSIS — I509 Heart failure, unspecified: Secondary | ICD-10-CM | POA: Insufficient documentation

## 2021-05-19 DIAGNOSIS — E440 Moderate protein-calorie malnutrition: Secondary | ICD-10-CM | POA: Insufficient documentation

## 2021-05-19 DIAGNOSIS — I252 Old myocardial infarction: Secondary | ICD-10-CM | POA: Diagnosis not present

## 2021-05-19 DIAGNOSIS — Z87891 Personal history of nicotine dependence: Secondary | ICD-10-CM | POA: Diagnosis not present

## 2021-05-19 DIAGNOSIS — Z902 Acquired absence of lung [part of]: Secondary | ICD-10-CM | POA: Insufficient documentation

## 2021-05-19 LAB — COMPREHENSIVE METABOLIC PANEL
ALT: 17 U/L (ref 0–44)
AST: 25 U/L (ref 15–41)
Albumin: 3.4 g/dL — ABNORMAL LOW (ref 3.5–5.0)
Alkaline Phosphatase: 113 U/L (ref 38–126)
Anion gap: 9 (ref 5–15)
BUN: 23 mg/dL (ref 8–23)
CO2: 25 mmol/L (ref 22–32)
Calcium: 9 mg/dL (ref 8.9–10.3)
Chloride: 107 mmol/L (ref 98–111)
Creatinine, Ser: 1.04 mg/dL — ABNORMAL HIGH (ref 0.44–1.00)
GFR, Estimated: >60 mL/min (ref >60)
Glucose, Bld: 66 mg/dL — ABNORMAL LOW (ref 70–99)
Potassium: 4.4 mmol/L (ref 3.5–5.1)
Sodium: 141 mmol/L (ref 135–145)
Total Bilirubin: 0.5 mg/dL (ref 0.3–1.2)
Total Protein: 6.6 g/dL (ref 6.5–8.1)

## 2021-05-19 LAB — CBC WITH DIFFERENTIAL/PLATELET
Abs Immature Granulocytes: 0.01 10*3/uL (ref 0.00–0.07)
Basophils Absolute: 0 10*3/uL (ref 0.0–0.1)
Basophils Relative: 0 %
Eosinophils Absolute: 0.3 10*3/uL (ref 0.0–0.5)
Eosinophils Relative: 7 %
HCT: 28.8 % — ABNORMAL LOW (ref 36.0–46.0)
Hemoglobin: 9 g/dL — ABNORMAL LOW (ref 12.0–15.0)
Immature Granulocytes: 0 %
Lymphocytes Relative: 32 %
Lymphs Abs: 1.3 10*3/uL (ref 0.7–4.0)
MCH: 30.4 pg (ref 26.0–34.0)
MCHC: 31.3 g/dL (ref 30.0–36.0)
MCV: 97.3 fL (ref 80.0–100.0)
Monocytes Absolute: 0.5 10*3/uL (ref 0.1–1.0)
Monocytes Relative: 12 %
Neutro Abs: 2 10*3/uL (ref 1.7–7.7)
Neutrophils Relative %: 49 %
Platelets: 366 10*3/uL (ref 150–400)
RBC: 2.96 MIL/uL — ABNORMAL LOW (ref 3.87–5.11)
RDW: 19.4 % — ABNORMAL HIGH (ref 11.5–15.5)
WBC: 4.1 10*3/uL (ref 4.0–10.5)
nRBC: 0 % (ref 0.0–0.2)

## 2021-05-19 MED ORDER — SODIUM CHLORIDE 0.9 % IV SOLN
10.0000 mg | Freq: Once | INTRAVENOUS | Status: AC
  Administered 2021-05-19: 10 mg via INTRAVENOUS
  Filled 2021-05-19: qty 10

## 2021-05-19 MED ORDER — SODIUM CHLORIDE 0.9 % IV SOLN
Freq: Once | INTRAVENOUS | Status: AC
Start: 2021-05-19 — End: 2021-05-19
  Filled 2021-05-19: qty 250

## 2021-05-19 MED ORDER — PALONOSETRON HCL INJECTION 0.25 MG/5ML
0.2500 mg | Freq: Once | INTRAVENOUS | Status: AC
  Administered 2021-05-19: 0.25 mg via INTRAVENOUS
  Filled 2021-05-19: qty 5

## 2021-05-19 MED ORDER — FAMOTIDINE 20 MG IN NS 100 ML IVPB
20.0000 mg | Freq: Once | INTRAVENOUS | Status: AC
  Administered 2021-05-19: 20 mg via INTRAVENOUS
  Filled 2021-05-19: qty 20

## 2021-05-19 MED ORDER — SODIUM CHLORIDE 0.9 % IV SOLN
347.5000 mg | Freq: Once | INTRAVENOUS | Status: AC
  Administered 2021-05-19: 350 mg via INTRAVENOUS
  Filled 2021-05-19: qty 35

## 2021-05-19 MED ORDER — SODIUM CHLORIDE 0.9 % IV SOLN
150.0000 mg | Freq: Once | INTRAVENOUS | Status: AC
  Administered 2021-05-19: 150 mg via INTRAVENOUS
  Filled 2021-05-19: qty 150

## 2021-05-19 MED ORDER — DIPHENHYDRAMINE HCL 50 MG/ML IJ SOLN
50.0000 mg | Freq: Once | INTRAMUSCULAR | Status: AC
  Administered 2021-05-19: 50 mg via INTRAVENOUS
  Filled 2021-05-19: qty 1

## 2021-05-19 MED ORDER — SODIUM CHLORIDE 0.9 % IV SOLN
135.0000 mg/m2 | Freq: Once | INTRAVENOUS | Status: AC
  Administered 2021-05-19: 204 mg via INTRAVENOUS
  Filled 2021-05-19: qty 34

## 2021-05-19 NOTE — Progress Notes (Signed)
Nutrition Follow-up:  Patient with stage II lung cancer.  Patient receiving adjuvant chemotherapy.    Met with patient during infusion.  Patient eating cheese puffs during visit.  Reports that she did not eat anything before coming but did drink a glucerna shake.  Reports that she has been trying to drink 3 a day.  Eating more vegetables (salads, broccoli), soups, spaghetti, shrimp.  Says that her sister cooks "big meats".  "I can make 6 meals off the cube steak that she cooks.  I don't eat much meat."    Patient getting sleepy from benadryl during treatment.    Medications: reviewed  Labs: glucose 66  Anthropometrics:   Weight 114 lb today  115 lb on 11/21 113 lb on 10/25 116 lb on 10/4 110 lb on 9/12   NUTRITION DIAGNOSIS: Inadequate oral intake continues   INTERVENTION:  Discussed adding canned chicken into chicken noodle soup for more protein.  Patient wanting to try this.   Continue glucerna shakes TID.     MONITORING, EVALUATION, GOAL: weight trends, intake   NEXT VISIT: Thursday, Dec 22nd during infusion  Avana Kreiser B. Zenia Resides, [place], [place] Registered Dietitian [phone] (mobile)

## 2021-05-19 NOTE — Progress Notes (Signed)
Hematology/Oncology progress note [phone] [phone]   Patient Care Team: [name], MD as PCP - General (Family Medicine) Earlie Server, MD as Consulting Physician (Hematology and Oncology)  REFERRING PROVIDER: [name], MD  CHIEF COMPLAINTS/REASON FOR VISIT:  Follow up for stage IIIA lung cancer  HISTORY OF PRESENTING ILLNESS:  Patient was recently seen by urology Dr. Candiss Norse ski for evaluation of microscopic hematuria, frequent UTI. 11/17/2020, CT hematuria work-up was obtained which showed no evidence of urinary tract calculus or hydronephrosis.  Small left renal cyst. Incidental findings of 1.9 x 1.2 cm left lower lobe pulmonary nodule worrisome for malignancy. Patient was referred to establish care with oncology for further evaluation  .  Patient denies any shortness of breath, cough, hemoptysis, unintentional weight loss, fever or night sweats. She currently smokes 1 cigar/day.  She started smoking cigarettes at age of 29.  She quitted at age of 81 and restarted smoking cigar a few years ago.  Patient has a history of depression and is on Cymbalta.  Diabetes, hyperlipidemia, hypertension, peripheral artery disease Per patient she also has history of heart attack in 2011. History of right lower extremity BKA in 2021, 02/15/2020 history of pulmonary embolism involving segmental and subsegmental branches of right upper, middle, lower lobe pulmonary arteries.  She was put on on apixaban. Patient lives with her aunt.  12/07/2020, PET scan showed left lower lobe 1.3 x 1.8 cm lung nodule with SUV of 6.5.  Suspect early stage primary lung neoplasm.  No hilar or mediastinal lymphadenopathy activity.  # 12/23/2020 S/p CT guided biopsy of left lower lobe mass.  Pathology showed non-small cell carcinoma, favor adenocarcinoma.  Positive for TTF-1, negative for p40.     # 01/17/2021, patient underwent left lower lobectomy with lymph node dissection. Pathology  showed invasive poorly differentiated adenocarcinoma, 2.2 cm.  Tumor abuts pleura but visceral pleural surface is not involved by carcinoma.  LVI invasion is present, resection margins are negative for carcinoma.  10 lymph nodes were harvested and 3 lymph nodes were involved with carcinoma. pT1c pN2 Patient's case was discussed at [facility] oncology tumor board.  And consensus recommendation was adjuvant chemotherapy and sent tissue for molecular/PD-L1 testing.  Foundation one testing showed PD-L1 1,  No reportable alterations ERBB2 [id number]  # 02/28/2021 adjuvant carboplatin and Taxol  INTERVAL HISTORY [name] COLETTA is a 65 y.o. female who has above history reviewed by me today presents for follow up visit for Stage IIIA.  She feels much better today. She takes all her BP medication except hydralazine.  Appetite is fair. She has gained weight.  No dysuria  Denies any fever or chills, flank pain no nausea vomiting.  Review of Systems  Constitutional:  Negative for appetite change, chills, fatigue and fever.  HENT:   Negative for hearing loss and voice change.   Eyes:  Negative for eye problems.  Respiratory:  Negative for chest tightness and cough.   Cardiovascular:  Negative for chest pain.  Gastrointestinal:  Negative for abdominal distention, abdominal pain, blood in stool, diarrhea and nausea.  Endocrine: Negative for hot flashes.  Genitourinary:  Negative for difficulty urinating, dysuria and frequency.   Musculoskeletal:  Negative for arthralgias.  Skin:  Negative for itching and rash.  Neurological:  Negative for extremity weakness.  Hematological:  Negative for adenopathy.  Psychiatric/Behavioral:  Negative for confusion.    MEDICAL HISTORY:  Past Medical History:  Diagnosis Date   Anemia    Cancer ([place])  CHF (congestive heart failure) ([place])    02/16/20 HFrEF 20-25% in setting of acute DVT/PE, underlying CAD   CKD (chronic kidney disease)    Coronary artery disease     02/20/20: CTO pRCA with left-to-right collaterals, 50% mLAD, 80% OM1, medical therapy   Depression    Diabetes ([place])    Diabetes mellitus without complication ([place])    DVT (deep venous thrombosis) ([place]) 02/16/2020   RLE DVT proximal veins 02/16/20 Duplex   Hyperlipidemia    Hypertension    Myocardial infarction [facility]) 2009   Stress related per patient; NSTEMI 2012 100% RCA with left-to-right collaterals   PE (pulmonary thromboembolism) ([place]) 02/15/2020   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, RLE BKA   Peripheral artery disease ([place])     SURGICAL HISTORY: Past Surgical History:  Procedure Laterality Date   Right lower extremity BKA Right     SOCIAL HISTORY: Social History   Socioeconomic History   Marital status: Married    Spouse name: Not on file   Number of children: Not on file   Years of education: Not on file   Highest education level: Not on file  Occupational History   Not on file  Tobacco Use   Smoking status: Former    Packs/day: 0.25    Types: Cigars, Cigarettes    Quit date: 01/17/2021    Years since quitting: 0.3   Smokeless tobacco: Never  Vaping Use   Vaping Use: Never used  Substance and Sexual Activity   Alcohol use: Never   Drug use: Not Currently   Sexual activity: Not Currently  Other Topics Concern   Not on file  Social History Narrative   Not on file   Social Determinants of Health   Financial Resource Strain: Not on file  Food Insecurity: Not on file  Transportation Needs: Not on file  Physical Activity: Not on file  Stress: Not on file  Social Connections: Not on file  Intimate Partner Violence: Not on file    FAMILY HISTORY: Family History  Problem Relation Age of Onset   Heart disease Mother    Diabetes Mother    Heart disease Father    Diabetes Father     ALLERGIES:  is allergic to lisinopril.  MEDICATIONS:  Current Outpatient Medications  Medication Sig Dispense Refill   ACCU-CHEK GUIDE test strip USE UP TP 4  TIMES A DAY AS DIRECTED 100 strip 1   Accu-Chek Softclix Lancets lancets SMARTSIG:Topical 1-4 Times Daily     aspirin EC 81 MG tablet Take 81 mg by mouth in the morning. Swallow whole.     atorvastatin (LIPITOR) 80 MG tablet Take 1 tablet (80 mg total) by mouth daily. 90 tablet 1   blood glucose meter kit and supplies KIT Dispense based on patient and insurance preference. Use up to four times daily as directed. 1 each 0   carboxymethylcellulose (REFRESH PLUS) 0.5 % SOLN Place 1 drop into both eyes in the morning and at bedtime.     ciprofloxacin (CIPRO) 250 MG tablet Take 1 tablet (250 mg total) by mouth 2 (two) times daily. 6 tablet 0   diphenhydrAMINE (BENADRYL) 25 MG tablet Take 25 mg by mouth at bedtime.     docusate sodium (COLACE) 100 MG capsule Take 1 capsule (100 mg total) by mouth 2 (two) times daily. 60 capsule 1   DULoxetine (CYMBALTA) 20 MG capsule TAKE 2 CAPSULES (40 MG TOTAL) BY MOUTH IN THE MORNING AND AT BEDTIME.  180 capsule 2   gabapentin (NEURONTIN) 100 MG capsule Take 1 capsule (100 mg total) by mouth 2 (two) times daily. 60 capsule 5   HUMALOG KWIKPEN 100 UNIT/ML KwikPen Inject 8 Units into the skin 3 (three) times daily after meals.     isosorbide dinitrate (ISORDIL) 30 MG tablet TAKE 1 TABLET (30 MG TOTAL) BY MOUTH IN THE MORNING, AT NOON, AND AT BEDTIME. 90 tablet 0   JARDIANCE 10 MG TABS tablet TAKE 1 TABLET BY MOUTH EVERY DAY 90 tablet 0   ketoconazole (NIZORAL) 2 % cream Apply 1 application topically in the morning and at bedtime. Applied to feet 60 g 1   LANTUS SOLOSTAR 100 UNIT/ML Solostar Pen 16 Units at bedtime.     metFORMIN (GLUCOPHAGE-XR) 500 MG 24 hr tablet TAKE 1 TABLET BY MOUTH TWICE A DAY 180 tablet 0   metoprolol succinate (TOPROL-XL) 50 MG 24 hr tablet Take 1 tablet (50 mg total) by mouth daily. Take with or immediately following a meal. 90 tablet 1   Multiple Vitamin (MULTIVITAMIN WITH MINERALS) TABS tablet Take 1 tablet by mouth in the morning. Adults 50+      ondansetron (ZOFRAN) 8 MG tablet Take 1 tablet (8 mg total) by mouth 2 (two) times daily as needed (Nausea or vomiting). Start if needed on the third day after cisplatin. 30 tablet 1   potassium chloride (KLOR-CON) 10 MEQ tablet Take 1 tablet (10 mEq total) by mouth daily. 14 tablet 0   prochlorperazine (COMPAZINE) 10 MG tablet Take 1 tablet (10 mg total) by mouth every 6 (six) hours as needed (Nausea or vomiting). 30 tablet 1   spironolactone (ALDACTONE) 25 MG tablet TAKE 1 TABLET (25 MG TOTAL) BY MOUTH DAILY. 90 tablet 0   hydrALAZINE (APRESOLINE) 100 MG tablet Take 1 tablet (100 mg total) by mouth 3 (three) times daily. (Patient not taking: Reported on 05/09/2021) 270 tablet 0   No current facility-administered medications for this visit.   Facility-Administered Medications Ordered in Other Visits  Medication Dose Route Frequency Provider Last Rate Last Admin   CARBOplatin (PARAPLATIN) 350 mg in sodium chloride 0.9 % 250 mL chemo infusion  350 mg Intravenous Once Earlie Server, MD       PACLitaxel (TAXOL) 204 mg in sodium chloride 0.9 % 250 mL chemo infusion (> 81m/m2)  135 mg/m2 (Treatment Plan Recorded) Intravenous Once YEarlie Server MD 95 mL/hr at 05/19/21 1056 204 mg at 05/19/21 1056     PHYSICAL EXAMINATION: ECOG PERFORMANCE STATUS: 1 - Symptomatic but completely ambulatory Vitals:   05/19/21 0847  BP: (!) 157/88  Pulse: 79  Temp: (!) 96.5 F (35.8 C)   Filed Weights   05/19/21 0847  Weight: 114 lb (51.7 kg)    Physical Exam Constitutional:      General: She is not in acute distress.    Appearance: She is ill-appearing.  HENT:     Head: Normocephalic and atraumatic.  Eyes:     General: No scleral icterus. Cardiovascular:     Rate and Rhythm: Normal rate and regular rhythm.     Heart sounds: Normal heart sounds.  Pulmonary:     Effort: Pulmonary effort is normal. No respiratory distress.     Breath sounds: No wheezing.     Comments: Absent breath sounds left  basilar Abdominal:     General: Bowel sounds are normal. There is no distension.     Palpations: Abdomen is soft.  Musculoskeletal:        General:  No deformity. Normal range of motion.     Cervical back: Normal range of motion and neck supple.     Comments: Right lower extremity BKA  Skin:    General: Skin is warm and dry.     Findings: No erythema or rash.  Neurological:     Mental Status: She is alert and oriented to person, place, and time. Mental status is at baseline.     Cranial Nerves: No cranial nerve deficit.     Coordination: Coordination normal.  Psychiatric:        Mood and Affect: Mood normal.    LABORATORY DATA:  I have reviewed the data as listed Lab Results  Component Value Date   WBC 4.1 05/19/2021   HGB 9.0 (L) 05/19/2021   HCT 28.8 (L) 05/19/2021   MCV 97.3 05/19/2021   PLT 366 05/19/2021   Recent Labs    08/10/20 1658 11/17/20 1149 05/04/21 1335 05/05/21 0859 05/09/21 1003 05/19/21 0834  NA 136   < > 139 138 136 141  K 4.6   < > 4.6 4.1 4.5 4.4  CL 101   < > 107 107 103 107  CO2 17*   < > '23 23 26 25  ' GLUCOSE 485*   < > 186* 85 139* 66*  BUN 39*   < > 32* 29* 22 23  CREATININE 1.18*   < > 1.66* 1.45* 1.13* 1.04*  CALCIUM 9.2   < > 8.2* 8.3* 8.8* 9.0  GFRNONAA 50*   < > 34* 41* 55* >60  GFRAA 57*  --   --   --   --   --   PROT  --    < > 6.1*  --  7.2 6.6  ALBUMIN 3.2*   < > 3.1*  --  3.5 3.4*  AST  --    < > 27  --  31 25  ALT  --    < > 19  --  23 17  ALKPHOS  --    < > 112  --  133* 113  BILITOT  --    < > 0.1*  --  0.2* 0.5   < > = values in this interval not displayed.    Iron/TIBC/Ferritin/ %Sat    Component Value Date/Time   IRON 44 12/30/2020 1437   TIBC 347 12/30/2020 1437   FERRITIN 15 12/30/2020 1437   IRONPCTSAT 13 12/30/2020 1437      RADIOGRAPHIC STUDIES: I have personally reviewed the radiological images as listed and agreed with the findings in the report. DG Chest 2 View  Result Date: 04/26/2021 CLINICAL DATA:   Non-small cell lung cancer. History of left lung surgery. EXAM: CHEST - 2 VIEW COMPARISON:  Radiographs 02/09/2021, 01/22/2021 and 01/21/2021. FINDINGS: There is stable volume loss in the left hemithorax with elevation of the left hemidiaphragm and left basilar pleuroparenchymal scarring related to previous partial left pneumonectomy. The right lung is clear. The heart size and mediastinal contours are stable. No evidence of pneumothorax. The bones appear unremarkable. IMPRESSION: Stable postoperative chest.  No acute cardiopulmonary process. Electronically Signed   By: Richardean Sale M.D.   On: 04/26/2021 15:35      ASSESSMENT & PLAN:  1. Non-small cell cancer of left lung ([place])   2. Encounter for antineoplastic chemotherapy   3. Moderate protein-calorie malnutrition ([place])   4. Normocytic anemia   5. Stage 3b chronic kidney disease ([place])     Cancer Staging  Non-small cell  lung cancer [facility]) Staging form: Lung, AJCC 8th Edition - Pathologic stage from 01/28/2021: Stage IIIA (pT1c, pN2, cM0) - Signed by Earlie Server, MD on 02/17/2021  #Stage IIIA left lung non-small cell lung cancer-.  Poorly differentiated adenocarcinoma.  Status post left lower lobectomy and lymph node dissection pT1c pN2 [pathology addendum changed to N2]  She has negative surgical margin, no clear benefit of postoperative RT.  Labs are reviewed and discussed with patient. Proceed with cycle 4 carboplatin and Taxol.  Dose reduce Taxol to 191m/m2 Plan starting immunotherapy TRayne Dufor 1 year for maintenance. Rational and side effects were discussed with patient.   #Hypotension,resolved.  BP is 157/88.  Advise patient to continue her all her BP medication, except hydralazine, start 105mdaily instead of 3 times daily.  Encourage her to check BP at home.   #CKD creatinine is stable.  #Normocytic anemia, probably secondary to chronic kidney disease and chemotherapy.  Hb is stable.     #Malnutrition, continue  nutrition supplements. Weight has increased.   All questions were answered. The patient knows to call the clinic with any problems questions or concerns. Patient was recommended to go to emergency room if her symptoms get worse. cc JoJuline PatchMD   Return of visit:  Lab MD [place] in 3 weeks   ZhEarlie ServerMD, PhD  05/19/2021

## 2021-05-19 NOTE — Patient Instructions (Signed)
[facility] CANCER CTR AT [place]  Discharge Instructions: Thank you for choosing [facility] to provide your oncology and hematology care.  If you have a lab appointment with the [facility], please go directly to the [facility] and check in at the registration area.  Wear comfortable clothing and clothing appropriate for easy access to any Portacath or PICC line.   We strive to give you quality time with your provider. You may need to reschedule your appointment if you arrive late (15 or more minutes).  Arriving late affects you and other patients whose appointments are after yours.  Also, if you miss three or more appointments without notifying the office, you may be dismissed from the clinic at the provider's discretion.      For prescription refill requests, have your pharmacy contact our office and allow 72 hours for refills to be completed.    Today you received the following chemotherapy and/or immunotherapy agents :  Taxol / Carboplatin   To help prevent nausea and vomiting after your treatment, we encourage you to take your nausea medication as directed.  BELOW ARE SYMPTOMS THAT SHOULD BE REPORTED IMMEDIATELY: *FEVER GREATER THAN 100.4 F (38 C) OR HIGHER *CHILLS OR SWEATING *NAUSEA AND VOMITING THAT IS NOT CONTROLLED WITH YOUR NAUSEA MEDICATION *UNUSUAL SHORTNESS OF BREATH *UNUSUAL BRUISING OR BLEEDING *URINARY PROBLEMS (pain or burning when urinating, or frequent urination) *BOWEL PROBLEMS (unusual diarrhea, constipation, pain near the anus) TENDERNESS IN MOUTH AND THROAT WITH OR WITHOUT PRESENCE OF ULCERS (sore throat, sores in mouth, or a toothache) UNUSUAL RASH, SWELLING OR PAIN  UNUSUAL VAGINAL DISCHARGE OR ITCHING   Items with * indicate a potential emergency and should be followed up as soon as possible or go to the Emergency Department if any problems should occur.  Please show the CHEMOTHERAPY ALERT CARD or IMMUNOTHERAPY ALERT CARD at  check-in to the Emergency Department and triage nurse.  Should you have questions after your visit or need to cancel or reschedule your appointment, please contact [facility] CANCER [place] AT [place]  [phone] and follow the prompts.  Office hours are 8:00 a.m. to 4:30 p.m. Monday - Friday. Please note that voicemails left after 4:00 p.m. may not be returned until the following business day.  We are closed weekends and major holidays. You have access to a nurse at all times for urgent questions. Please call the main number to the clinic [phone] and follow the prompts.  For any non-urgent questions, you may also contact your provider using MyChart. We now offer e-Visits for anyone 63 and older to request care online for non-urgent symptoms. For details visit [email or web address].   Also download the MyChart app! Go to the app store, search "MyChart", open the app, select [facility], and log in with your MyChart username and password.  Due to Covid, a mask is required upon entering the hospital/clinic. If you do not have a mask, one will be given to you upon arrival. For doctor visits, patients may have 1 support person aged 63 or older with them. For treatment visits, patients cannot have anyone with them due to current Covid guidelines and our immunocompromised population.

## 2021-05-24 ENCOUNTER — Encounter: Payer: Self-pay | Admitting: Oncology

## 2021-05-26 DIAGNOSIS — E119 Type 2 diabetes mellitus without complications: Secondary | ICD-10-CM | POA: Diagnosis not present

## 2021-05-26 DIAGNOSIS — C3492 Malignant neoplasm of unspecified part of left bronchus or lung: Secondary | ICD-10-CM | POA: Diagnosis not present

## 2021-05-31 ENCOUNTER — Encounter: Payer: Self-pay | Admitting: Oncology

## 2021-05-31 ENCOUNTER — Encounter: Payer: Self-pay | Admitting: Urology

## 2021-05-31 ENCOUNTER — Other Ambulatory Visit: Payer: Self-pay | Admitting: *Deleted

## 2021-05-31 ENCOUNTER — Other Ambulatory Visit: Payer: Self-pay

## 2021-05-31 ENCOUNTER — Other Ambulatory Visit
Admission: RE | Admit: 2021-05-31 | Discharge: 2021-05-31 | Disposition: A | Discharge status: Home / Self Care | Payer: Medicaid Other | Attending: Urology | Admitting: Urology

## 2021-05-31 ENCOUNTER — Ambulatory Visit (INDEPENDENT_AMBULATORY_CARE_PROVIDER_SITE_OTHER): Payer: Medicaid Other | Admitting: Urology

## 2021-05-31 VITALS — BP 152/87 | HR 87 | Ht 65.0 in | Wt 116.0 lb

## 2021-05-31 DIAGNOSIS — N390 Urinary tract infection, site not specified: Secondary | ICD-10-CM

## 2021-05-31 DIAGNOSIS — N3281 Overactive bladder: Secondary | ICD-10-CM | POA: Diagnosis not present

## 2021-05-31 DIAGNOSIS — N3941 Urge incontinence: Secondary | ICD-10-CM | POA: Insufficient documentation

## 2021-05-31 NOTE — Progress Notes (Signed)
° °  05/31/2021 9:52 AM   [name] [name] [birth year] [id number]  Reason for visit: Follow up recurrent UTI, OAB  HPI: 65 year old female with poorly controlled diabetes who presented to me in March with recurrent Klebsiella UTIs.  This resolved after a short course of Bactrim.  With her extensive smoking history and persistent microscopic hematuria, she underwent further work-up with cystoscopy and CT urogram.  Cystoscopy showed subtle erythema likely secondary to infection, and cytology was negative.  CT showed no urologic abnormalities, however there is a 2 cm left lower lobe lung mass.  I referred her to oncology, and she has undergone surgery and chemotherapy for lung cancer since that time.  She reports she is overall doing very well.  She denies any urinary complaints during the day or UTI symptoms.  She denies any UTIs since our last visit.  She is unable to void for urinalysis today.  Her only complaint is what sounds like urge incontinence overnight, and she wears a depends to bed.  She is not interested in trying medications for this.  We reviewed her poorly controlled diabetes with hemoglobin A1c greater than 10 that likely contributes to her urinary symptoms.  We also reviewed behavioral strategies.  -Behavioral strategies discussed regarding overactive bladder and UTIs -RTC 1 year PVR   Billey Co, MD  [facility] [address] [place], [place] [address] [phone]

## 2021-05-31 NOTE — Addendum Note (Signed)
Addended by: Despina Hidden on: 05/31/2021 09:53 AM   Modules accepted: Orders

## 2021-06-02 ENCOUNTER — Ambulatory Visit: Payer: Medicaid Other

## 2021-06-07 DIAGNOSIS — E119 Type 2 diabetes mellitus without complications: Secondary | ICD-10-CM | POA: Diagnosis not present

## 2021-06-07 DIAGNOSIS — N3942 Incontinence without sensory awareness: Secondary | ICD-10-CM | POA: Diagnosis not present

## 2021-06-08 ENCOUNTER — Other Ambulatory Visit: Payer: Self-pay

## 2021-06-08 ENCOUNTER — Ambulatory Visit
Admission: RE | Admit: 2021-06-08 | Discharge: 2021-06-08 | Disposition: A | Discharge status: Home / Self Care | Payer: Medicaid Other | Source: Ambulatory Visit | Attending: Oncology | Admitting: Oncology

## 2021-06-08 DIAGNOSIS — C3490 Malignant neoplasm of unspecified part of unspecified bronchus or lung: Secondary | ICD-10-CM | POA: Diagnosis not present

## 2021-06-08 DIAGNOSIS — J439 Emphysema, unspecified: Secondary | ICD-10-CM | POA: Diagnosis not present

## 2021-06-08 DIAGNOSIS — I700 Atherosclerosis of aorta: Secondary | ICD-10-CM | POA: Diagnosis not present

## 2021-06-08 DIAGNOSIS — C3492 Malignant neoplasm of unspecified part of left bronchus or lung: Secondary | ICD-10-CM | POA: Diagnosis not present

## 2021-06-08 DIAGNOSIS — J90 Pleural effusion, not elsewhere classified: Secondary | ICD-10-CM | POA: Diagnosis not present

## 2021-06-09 ENCOUNTER — Inpatient Hospital Stay: Payer: Medicaid Other

## 2021-06-09 ENCOUNTER — Telehealth: Payer: Self-pay

## 2021-06-09 ENCOUNTER — Inpatient Hospital Stay: Payer: Medicaid Other | Admitting: Oncology

## 2021-06-09 ENCOUNTER — Telehealth: Payer: Self-pay | Admitting: Family Medicine

## 2021-06-09 ENCOUNTER — Other Ambulatory Visit: Payer: Self-pay

## 2021-06-09 ENCOUNTER — Encounter: Payer: Self-pay | Admitting: Oncology

## 2021-06-09 ENCOUNTER — Other Ambulatory Visit: Payer: Self-pay | Admitting: Oncology

## 2021-06-09 DIAGNOSIS — C3492 Malignant neoplasm of unspecified part of left bronchus or lung: Secondary | ICD-10-CM

## 2021-06-09 NOTE — Telephone Encounter (Signed)
Patient stated that Transportation of [facility] will not allow them to call and schedule transportation any more. If in need of transportation, they will need to get the facility to call in and schedule the appointments. Patient next appointment is on 06/15/2021. I explained to the patient that I will call and set up appointment , but she will need to try her best to keep this appointment.

## 2021-06-09 NOTE — Telephone Encounter (Signed)
Copied from [place] [id number]. Topic: General - Other >> Jun 09, 2021  2:24 PM Erick Blinks wrote: Reason for CRM: Pt called and wants transportation for her appt

## 2021-06-09 NOTE — Progress Notes (Signed)
DISCONTINUE ON PATHWAY REGIMEN - Non-Small Cell Lung ? ? ?  A cycle is every 21 days: ?    Paclitaxel  ?    Carboplatin  ? ?**Always confirm dose/schedule in your pharmacy ordering system** ? ?REASON: Other Reason ?PRIOR TREATMENT: [id number]: Carboplatin AUC=6 + Paclitaxel 200 mg/m2 q21 Days x 4 Cycles ?TREATMENT RESPONSE: N/A - Adjuvant Therapy ? ?START ON PATHWAY REGIMEN - Non-Small Cell Lung ? ? ?  A cycle is every 21 days: ?    Atezolizumab  ? ?**Always confirm dose/schedule in your pharmacy ordering system** ? ?Patient Characteristics: ?Postoperative without Neoadjuvant Therapy (Pathologic Staging), Stage III, Adjuvant Chemotherapy Completed, EGFR Negative/Unknown and ALK Negative/Unknown, PD-L1 Expression ? 1% (TC) ?Therapeutic Status: Postoperative without Neoadjuvant Therapy (Pathologic Staging) ?AJCC T Category: pT1c ?AJCC N Category: pN2 ?AJCC M Category: cM0 ?AJCC 8 Stage Grouping: IIIA ?ALK Rearrangement Status: Negative ?EGFR Mutation Status: Negative/Wild Type ?PD-L1 Expression Status: PD-L1 Expression ? 1% (TC) ?Intent of Therapy: ?Curative Intent, Discussed with Patient ?

## 2021-06-10 ENCOUNTER — Inpatient Hospital Stay: Payer: Medicaid Other

## 2021-06-10 ENCOUNTER — Inpatient Hospital Stay: Payer: Medicaid Other | Admitting: Oncology

## 2021-06-14 ENCOUNTER — Encounter: Payer: Self-pay | Admitting: Oncology

## 2021-06-14 ENCOUNTER — Ambulatory Visit: Payer: Medicaid Other | Admitting: Family Medicine

## 2021-06-14 NOTE — Progress Notes (Signed)
Encounter cancelled

## 2021-06-14 NOTE — Progress Notes (Signed)
Pharmacist Chemotherapy Monitoring - Initial Assessment    Anticipated start date: 06/23/21   The following has been reviewed per standard work regarding the patient's treatment regimen: The patient's diagnosis, treatment plan and drug doses, and organ/hematologic function Lab orders and baseline tests specific to treatment regimen  The treatment plan start date, drug sequencing, and pre-medications Prior authorization status  Patient's documented medication list, including drug-drug interaction screen and prescriptions for anti-emetics and supportive care specific to the treatment regimen The drug concentrations, fluid compatibility, administration routes, and timing of the medications to be used The patient's access for treatment and lifetime cumulative dose history, if applicable  The patient's medication allergies and previous infusion related reactions, if applicable   Changes made to treatment plan:  treatment plan date  Follow up needed:  signing treatment plan   Adelina Mings, [place], 06/14/2021  2:27 PM

## 2021-06-15 ENCOUNTER — Ambulatory Visit (INDEPENDENT_AMBULATORY_CARE_PROVIDER_SITE_OTHER): Payer: Medicaid Other | Admitting: Family Medicine

## 2021-06-15 ENCOUNTER — Other Ambulatory Visit: Payer: Self-pay

## 2021-06-15 ENCOUNTER — Other Ambulatory Visit
Admission: RE | Admit: 2021-06-15 | Discharge: 2021-06-15 | Disposition: A | Discharge status: Home / Self Care | Payer: Medicaid Other | Attending: Oncology | Admitting: Oncology

## 2021-06-15 ENCOUNTER — Encounter: Payer: Self-pay | Admitting: Family Medicine

## 2021-06-15 VITALS — BP 120/64 | HR 80 | Ht 65.0 in | Wt 109.0 lb

## 2021-06-15 DIAGNOSIS — C3492 Malignant neoplasm of unspecified part of left bronchus or lung: Secondary | ICD-10-CM | POA: Diagnosis not present

## 2021-06-15 DIAGNOSIS — I10 Essential (primary) hypertension: Secondary | ICD-10-CM

## 2021-06-15 LAB — CBC WITH DIFFERENTIAL/PLATELET
Abs Immature Granulocytes: 0 10*3/uL (ref 0.00–0.07)
Basophils Absolute: 0 10*3/uL (ref 0.0–0.1)
Basophils Relative: 0 %
Eosinophils Absolute: 0.1 10*3/uL (ref 0.0–0.5)
Eosinophils Relative: 3 %
HCT: 30.3 % — ABNORMAL LOW (ref 36.0–46.0)
Hemoglobin: 9.5 g/dL — ABNORMAL LOW (ref 12.0–15.0)
Immature Granulocytes: 0 %
Lymphocytes Relative: 28 %
Lymphs Abs: 1 10*3/uL (ref 0.7–4.0)
MCH: 31.8 pg (ref 26.0–34.0)
MCHC: 31.4 g/dL (ref 30.0–36.0)
MCV: 101.3 fL — ABNORMAL HIGH (ref 80.0–100.0)
Monocytes Absolute: 0.3 10*3/uL (ref 0.1–1.0)
Monocytes Relative: 9 %
Neutro Abs: 2.1 10*3/uL (ref 1.7–7.7)
Neutrophils Relative %: 60 %
Platelets: 203 10*3/uL (ref 150–400)
RBC: 2.99 MIL/uL — ABNORMAL LOW (ref 3.87–5.11)
RDW: 18.4 % — ABNORMAL HIGH (ref 11.5–15.5)
WBC: 3.6 10*3/uL — ABNORMAL LOW (ref 4.0–10.5)
nRBC: 0 % (ref 0.0–0.2)

## 2021-06-15 LAB — COMPREHENSIVE METABOLIC PANEL
ALT: 21 U/L (ref 0–44)
AST: 28 U/L (ref 15–41)
Albumin: 3.5 g/dL (ref 3.5–5.0)
Alkaline Phosphatase: 138 U/L — ABNORMAL HIGH (ref 38–126)
Anion gap: 9 (ref 5–15)
BUN: 28 mg/dL — ABNORMAL HIGH (ref 8–23)
CO2: 24 mmol/L (ref 22–32)
Calcium: 9.1 mg/dL (ref 8.9–10.3)
Chloride: 104 mmol/L (ref 98–111)
Creatinine, Ser: 1.23 mg/dL — ABNORMAL HIGH (ref 0.44–1.00)
GFR, Estimated: 49 mL/min — ABNORMAL LOW (ref >60)
Glucose, Bld: 220 mg/dL — ABNORMAL HIGH (ref 70–99)
Potassium: 4 mmol/L (ref 3.5–5.1)
Sodium: 137 mmol/L (ref 135–145)
Total Bilirubin: 0.8 mg/dL (ref 0.3–1.2)
Total Protein: 6.9 g/dL (ref 6.5–8.1)

## 2021-06-15 LAB — T4, FREE: Free T4: 0.86 ng/dL (ref 0.61–1.12)

## 2021-06-15 LAB — TSH: TSH: 2.606 u[IU]/mL (ref 0.350–4.500)

## 2021-06-15 NOTE — Progress Notes (Signed)
Date:  06/15/2021   Name:  [name] [name]   DOB:  02-16-1956   MRN:  [id number]   Chief Complaint: Follow-up (Check up after chemo)  Hypertension This is a chronic problem. The current episode started more than 1 year ago. The problem has been gradually improving since onset. The problem is controlled. Pertinent negatives include no anxiety, blurred vision, chest pain, headaches, malaise/fatigue, neck pain, orthopnea, palpitations, peripheral edema, PND, shortness of breath or sweats. Risk factors for coronary artery disease include diabetes mellitus and dyslipidemia. Past treatments include direct vasodilators, diuretics and beta blockers. The current treatment provides moderate improvement. There are no compliance problems.  There is no history of angina, kidney disease, CAD/MI, CVA, heart failure, left ventricular hypertrophy, PVD or retinopathy. There is no history of chronic renal disease, a hypertension causing med or renovascular disease.   Lab Results  Component Value Date   NA 141 05/19/2021   K 4.4 05/19/2021   CO2 25 05/19/2021   GLUCOSE 66 (L) 05/19/2021   BUN 23 05/19/2021   CREATININE 1.04 (H) 05/19/2021   CALCIUM 9.0 05/19/2021   GFRNONAA >60 05/19/2021   Lab Results  Component Value Date   CHOL 180 01/25/2021   HDL 67 01/25/2021   LDLCALC 94 01/25/2021   TRIG 95 01/25/2021   TRIG 95 01/25/2021   No results found for: TSH Lab Results  Component Value Date   HGBA1C 7.8 01/25/2021   Lab Results  Component Value Date   WBC 3.6 (L) 06/15/2021   HGB 9.5 (L) 06/15/2021   HCT 30.3 (L) 06/15/2021   MCV 101.3 (H) 06/15/2021   PLT 203 06/15/2021   Lab Results  Component Value Date   ALT 17 05/19/2021   AST 25 05/19/2021   ALKPHOS 113 05/19/2021   BILITOT 0.5 05/19/2021   No results found for: 25OHVITD2, 25OHVITD3, VD25OH   Review of Systems  Constitutional:  Negative for chills, fever and malaise/fatigue.  HENT:  Negative for drooling, ear discharge, ear  pain and sore throat.   Eyes:  Negative for blurred vision.  Respiratory:  Negative for cough, shortness of breath and wheezing.   Cardiovascular:  Negative for chest pain, palpitations, orthopnea, leg swelling and PND.  Gastrointestinal:  Negative for abdominal pain, blood in stool, constipation, diarrhea and nausea.  Endocrine: Negative for polydipsia.  Genitourinary:  Negative for dysuria, frequency, hematuria and urgency.  Musculoskeletal:  Negative for back pain, myalgias and neck pain.  Skin:  Negative for rash.  Allergic/Immunologic: Negative for environmental allergies.  Neurological:  Negative for dizziness and headaches.  Hematological:  Does not bruise/bleed easily.  Psychiatric/Behavioral:  Negative for suicidal ideas. The patient is not nervous/anxious.    Patient Active Problem List   Diagnosis Date Noted   Moderate protein-calorie malnutrition ([place]) 04/12/2021   Stage 3b chronic kidney disease ([place]) 04/12/2021   Normocytic anemia 04/12/2021   Dyslipidemia 04/05/2021   Encounter for antineoplastic chemotherapy 02/28/2021   Non-small cell cancer of left lung ([place]) 02/17/2021   Tinea pedis of both feet 02/10/2021   Chronic pain due to neoplasm 02/10/2021   Adenocarcinoma, lung, left ([place]) 01/19/2021   S/P Robotic Assisted Video Thoracoscopy with Left Lower Lobectomy Lung, Intercostal nerve block, lymph node dissection 01/17/2021   Non-small cell lung cancer ([place]) 12/30/2020   Depression 12/09/2020   Hyperlipidemia 12/09/2020   Primary hypertension 12/09/2020   Heart failure with reduced ejection fraction ([place]) 02/16/2020   Multiple subsegmental pulmonary emboli without acute cor pulmonale ([place])  02/15/2020   Iron deficiency anemia 12/18/2019   Rectal pain 12/18/2019   Diabetic foot ulcer ([place]) 10/03/2019   Cellulitis 08/17/2019   Cocaine use 08/17/2019   Peripheral vascular disease ([place]) 08/17/2019   Tobacco use disorder 08/17/2019   Tobacco abuse, in remission  08/17/2019   Cervical dysplasia 04/02/2019   CAD (coronary artery disease), native coronary artery 06/06/2010   Type II diabetes mellitus ([place]) 06/06/2010   Acute subendocardial infarction, initial episode of care ([place]) 06/05/2010    Allergies  Allergen Reactions   Lisinopril Other (See Comments) and Swelling    Recurrent AKI and hyperkalemia when initiation attempted.    Past Surgical History:  Procedure Laterality Date   Right lower extremity BKA Right     Social History   Tobacco Use   Smoking status: Former    Packs/day: 0.25    Types: Cigars, Cigarettes    Quit date: 01/17/2021    Years since quitting: 0.4   Smokeless tobacco: Never  Vaping Use   Vaping Use: Never used  Substance Use Topics   Alcohol use: Never   Drug use: Not Currently     Medication list has been reviewed and updated.  Current Meds  Medication Sig   ACCU-CHEK GUIDE test strip USE UP TP 4 TIMES A DAY AS DIRECTED   Accu-Chek Softclix Lancets lancets SMARTSIG:Topical 1-4 Times Daily   aspirin EC 81 MG tablet Take 81 mg by mouth in the morning. Swallow whole.   atorvastatin (LIPITOR) 80 MG tablet Take 1 tablet (80 mg total) by mouth daily.   blood glucose meter kit and supplies KIT Dispense based on patient and insurance preference. Use up to four times daily as directed.   carboxymethylcellulose (REFRESH PLUS) 0.5 % SOLN Place 1 drop into both eyes in the morning and at bedtime.   diphenhydrAMINE (BENADRYL) 25 MG tablet Take 25 mg by mouth at bedtime.   docusate sodium (COLACE) 100 MG capsule Take 1 capsule (100 mg total) by mouth 2 (two) times daily.   DULoxetine (CYMBALTA) 20 MG capsule TAKE 2 CAPSULES (40 MG TOTAL) BY MOUTH IN THE MORNING AND AT BEDTIME.   gabapentin (NEURONTIN) 100 MG capsule Take 1 capsule (100 mg total) by mouth 2 (two) times daily.   HUMALOG KWIKPEN 100 UNIT/ML KwikPen Inject 8 Units into the skin 3 (three) times daily after meals.   isosorbide dinitrate (ISORDIL) 30 MG  tablet TAKE 1 TABLET (30 MG TOTAL) BY MOUTH IN THE MORNING, AT NOON, AND AT BEDTIME.   JARDIANCE 10 MG TABS tablet TAKE 1 TABLET BY MOUTH EVERY DAY   ketoconazole (NIZORAL) 2 % cream Apply 1 application topically in the morning and at bedtime. Applied to feet   LANTUS SOLOSTAR 100 UNIT/ML Solostar Pen 16 Units at bedtime.   metFORMIN (GLUCOPHAGE-XR) 500 MG 24 hr tablet TAKE 1 TABLET BY MOUTH TWICE A DAY   metoprolol succinate (TOPROL-XL) 50 MG 24 hr tablet Take 1 tablet (50 mg total) by mouth daily. Take with or immediately following a meal.   Multiple Vitamin (MULTIVITAMIN WITH MINERALS) TABS tablet Take 1 tablet by mouth in the morning. Adults 50+   ondansetron (ZOFRAN) 8 MG tablet Take 1 tablet (8 mg total) by mouth 2 (two) times daily as needed (Nausea or vomiting). Start if needed on the third day after cisplatin.   potassium chloride (KLOR-CON) 10 MEQ tablet Take 1 tablet (10 mEq total) by mouth daily.   prochlorperazine (COMPAZINE) 10 MG tablet Take 1 tablet (10 mg  total) by mouth every 6 (six) hours as needed (Nausea or vomiting).   spironolactone (ALDACTONE) 25 MG tablet TAKE 1 TABLET (25 MG TOTAL) BY MOUTH DAILY.    PHQ 2/9 Scores 06/15/2021 02/10/2021 12/21/2020 08/10/2020  PHQ - 2 Score 0 0 0 3  PHQ- 9 Score 0 4 0 4    GAD 7 : Generalized Anxiety Score 02/10/2021 08/10/2020 07/15/2020  Nervous, Anxious, on Edge 0 0 0  Control/stop worrying 0 0 0  Worry too much - different things 0 1 0  Trouble relaxing 0 0 0  Restless 0 0 0  Easily annoyed or irritable 1 0 0  Afraid - awful might happen 0 0 0  Total GAD 7 Score 1 1 0  Anxiety Difficulty Not difficult at all - -    BP Readings from Last 3 Encounters:  06/15/21 120/64  05/31/21 (!) 152/87  05/19/21 (!) 157/88    Physical Exam Vitals and nursing note reviewed.  Constitutional:      General: She is not in acute distress.    Appearance: She is not diaphoretic.  HENT:     Head: Normocephalic and atraumatic.     Right Ear:  Tympanic membrane and external ear normal.     Left Ear: Tympanic membrane and external ear normal.     Nose: Nose normal.  Eyes:     General:        Right eye: No discharge.        Left eye: No discharge.     Conjunctiva/sclera: Conjunctivae normal.     Pupils: Pupils are equal, round, and reactive to light.  Neck:     Thyroid: No thyromegaly.     Vascular: No JVD.  Cardiovascular:     Rate and Rhythm: Normal rate and regular rhythm.     Heart sounds: Normal heart sounds. No murmur heard.   No friction rub. No gallop.  Pulmonary:     Effort: Pulmonary effort is normal.     Breath sounds: Normal breath sounds. No wheezing, rhonchi or rales.  Abdominal:     General: Bowel sounds are normal.     Palpations: Abdomen is soft. There is no mass.     Tenderness: There is no abdominal tenderness. There is no guarding.  Musculoskeletal:        General: Normal range of motion.     Cervical back: Normal range of motion and neck supple.  Lymphadenopathy:     Cervical: No cervical adenopathy.  Skin:    General: Skin is warm and dry.  Neurological:     Mental Status: She is alert.     Deep Tendon Reflexes: Reflexes are normal and symmetric.    Wt Readings from Last 3 Encounters:  06/15/21 109 lb (49.4 kg)  05/31/21 116 lb (52.6 kg)  05/19/21 114 lb (51.7 kg)    BP 120/64    Pulse 80    Ht '5\' 5"'  (1.651 m)    Wt 109 lb (49.4 kg)    BMI 18.14 kg/m   Assessment and Plan:  1. Primary hypertension Chronic.  Controlled.  Stable.  Blood pressure is excellent 120/64 and will continue on current medication and dosing.  In general patient has multiple specialists at [facility] and follow-up with her medical care including endocrinology for her diabetes, cardiology for hypertension and vascular disease, and nephrology for her renal disease.  Patient does not need to come and see me for follow-up at that time not providing any of the  medications.  Patient but will return in a year for general recheck  and touching left base.

## 2021-06-23 ENCOUNTER — Inpatient Hospital Stay: Payer: Medicaid Other

## 2021-06-23 ENCOUNTER — Encounter: Payer: Self-pay | Admitting: Oncology

## 2021-06-23 ENCOUNTER — Inpatient Hospital Stay (HOSPITAL_BASED_OUTPATIENT_CLINIC_OR_DEPARTMENT_OTHER): Payer: Medicaid Other | Admitting: Oncology

## 2021-06-23 ENCOUNTER — Other Ambulatory Visit: Payer: Self-pay

## 2021-06-23 ENCOUNTER — Inpatient Hospital Stay: Payer: Medicaid Other | Attending: Oncology

## 2021-06-23 VITALS — BP 194/96 | HR 81 | Temp 96.5°F | Wt 115.7 lb

## 2021-06-23 DIAGNOSIS — Z5112 Encounter for antineoplastic immunotherapy: Secondary | ICD-10-CM | POA: Insufficient documentation

## 2021-06-23 DIAGNOSIS — I10 Essential (primary) hypertension: Secondary | ICD-10-CM | POA: Diagnosis not present

## 2021-06-23 DIAGNOSIS — E1122 Type 2 diabetes mellitus with diabetic chronic kidney disease: Secondary | ICD-10-CM | POA: Diagnosis not present

## 2021-06-23 DIAGNOSIS — D649 Anemia, unspecified: Secondary | ICD-10-CM | POA: Insufficient documentation

## 2021-06-23 DIAGNOSIS — Z5111 Encounter for antineoplastic chemotherapy: Secondary | ICD-10-CM

## 2021-06-23 DIAGNOSIS — R3129 Other microscopic hematuria: Secondary | ICD-10-CM | POA: Insufficient documentation

## 2021-06-23 DIAGNOSIS — Z902 Acquired absence of lung [part of]: Secondary | ICD-10-CM | POA: Insufficient documentation

## 2021-06-23 DIAGNOSIS — Z87440 Personal history of urinary (tract) infections: Secondary | ICD-10-CM | POA: Insufficient documentation

## 2021-06-23 DIAGNOSIS — Z89511 Acquired absence of right leg below knee: Secondary | ICD-10-CM | POA: Insufficient documentation

## 2021-06-23 DIAGNOSIS — Z86711 Personal history of pulmonary embolism: Secondary | ICD-10-CM | POA: Diagnosis not present

## 2021-06-23 DIAGNOSIS — Z79899 Other long term (current) drug therapy: Secondary | ICD-10-CM | POA: Diagnosis not present

## 2021-06-23 DIAGNOSIS — J432 Centrilobular emphysema: Secondary | ICD-10-CM | POA: Insufficient documentation

## 2021-06-23 DIAGNOSIS — F3289 Other specified depressive episodes: Secondary | ICD-10-CM | POA: Diagnosis not present

## 2021-06-23 DIAGNOSIS — C3492 Malignant neoplasm of unspecified part of left bronchus or lung: Secondary | ICD-10-CM

## 2021-06-23 DIAGNOSIS — Z86718 Personal history of other venous thrombosis and embolism: Secondary | ICD-10-CM | POA: Diagnosis not present

## 2021-06-23 DIAGNOSIS — I700 Atherosclerosis of aorta: Secondary | ICD-10-CM | POA: Insufficient documentation

## 2021-06-23 DIAGNOSIS — I130 Hypertensive heart and chronic kidney disease with heart failure and stage 1 through stage 4 chronic kidney disease, or unspecified chronic kidney disease: Secondary | ICD-10-CM | POA: Insufficient documentation

## 2021-06-23 DIAGNOSIS — F17210 Nicotine dependence, cigarettes, uncomplicated: Secondary | ICD-10-CM | POA: Diagnosis not present

## 2021-06-23 DIAGNOSIS — N1832 Chronic kidney disease, stage 3b: Secondary | ICD-10-CM | POA: Diagnosis not present

## 2021-06-23 DIAGNOSIS — R21 Rash and other nonspecific skin eruption: Secondary | ICD-10-CM | POA: Insufficient documentation

## 2021-06-23 DIAGNOSIS — Z888 Allergy status to other drugs, medicaments and biological substances status: Secondary | ICD-10-CM | POA: Diagnosis not present

## 2021-06-23 DIAGNOSIS — R4182 Altered mental status, unspecified: Secondary | ICD-10-CM | POA: Insufficient documentation

## 2021-06-23 DIAGNOSIS — J90 Pleural effusion, not elsewhere classified: Secondary | ICD-10-CM | POA: Diagnosis not present

## 2021-06-23 DIAGNOSIS — F32A Depression, unspecified: Secondary | ICD-10-CM | POA: Insufficient documentation

## 2021-06-23 DIAGNOSIS — I252 Old myocardial infarction: Secondary | ICD-10-CM | POA: Diagnosis not present

## 2021-06-23 DIAGNOSIS — E785 Hyperlipidemia, unspecified: Secondary | ICD-10-CM | POA: Insufficient documentation

## 2021-06-23 DIAGNOSIS — C3432 Malignant neoplasm of lower lobe, left bronchus or lung: Secondary | ICD-10-CM | POA: Diagnosis present

## 2021-06-23 DIAGNOSIS — Z833 Family history of diabetes mellitus: Secondary | ICD-10-CM | POA: Insufficient documentation

## 2021-06-23 DIAGNOSIS — I509 Heart failure, unspecified: Secondary | ICD-10-CM | POA: Diagnosis not present

## 2021-06-23 DIAGNOSIS — K7200 Acute and subacute hepatic failure without coma: Secondary | ICD-10-CM | POA: Diagnosis not present

## 2021-06-23 DIAGNOSIS — Z8249 Family history of ischemic heart disease and other diseases of the circulatory system: Secondary | ICD-10-CM | POA: Insufficient documentation

## 2021-06-23 DIAGNOSIS — R7989 Other specified abnormal findings of blood chemistry: Secondary | ICD-10-CM | POA: Diagnosis not present

## 2021-06-23 LAB — COMPREHENSIVE METABOLIC PANEL
ALT: 13 U/L (ref 0–44)
AST: 20 U/L (ref 15–41)
Albumin: 3 g/dL — ABNORMAL LOW (ref 3.5–5.0)
Alkaline Phosphatase: 107 U/L (ref 38–126)
Anion gap: 5 (ref 5–15)
BUN: 24 mg/dL — ABNORMAL HIGH (ref 8–23)
CO2: 24 mmol/L (ref 22–32)
Calcium: 8.6 mg/dL — ABNORMAL LOW (ref 8.9–10.3)
Chloride: 110 mmol/L (ref 98–111)
Creatinine, Ser: 1.28 mg/dL — ABNORMAL HIGH (ref 0.44–1.00)
GFR, Estimated: 47 mL/min — ABNORMAL LOW (ref >60)
Glucose, Bld: 120 mg/dL — ABNORMAL HIGH (ref 70–99)
Potassium: 3.6 mmol/L (ref 3.5–5.1)
Sodium: 139 mmol/L (ref 135–145)
Total Bilirubin: 0.3 mg/dL (ref 0.3–1.2)
Total Protein: 6.1 g/dL — ABNORMAL LOW (ref 6.5–8.1)

## 2021-06-23 LAB — CBC WITH DIFFERENTIAL/PLATELET
Abs Immature Granulocytes: 0.01 10*3/uL (ref 0.00–0.07)
Basophils Absolute: 0 10*3/uL (ref 0.0–0.1)
Basophils Relative: 0 %
Eosinophils Absolute: 0.2 10*3/uL (ref 0.0–0.5)
Eosinophils Relative: 5 %
HCT: 27.1 % — ABNORMAL LOW (ref 36.0–46.0)
Hemoglobin: 8.4 g/dL — ABNORMAL LOW (ref 12.0–15.0)
Immature Granulocytes: 0 %
Lymphocytes Relative: 38 %
Lymphs Abs: 1.4 10*3/uL (ref 0.7–4.0)
MCH: 31.8 pg (ref 26.0–34.0)
MCHC: 31 g/dL (ref 30.0–36.0)
MCV: 102.7 fL — ABNORMAL HIGH (ref 80.0–100.0)
Monocytes Absolute: 0.3 10*3/uL (ref 0.1–1.0)
Monocytes Relative: 9 %
Neutro Abs: 1.8 10*3/uL (ref 1.7–7.7)
Neutrophils Relative %: 48 %
Platelets: 287 10*3/uL (ref 150–400)
RBC: 2.64 MIL/uL — ABNORMAL LOW (ref 3.87–5.11)
RDW: 17.5 % — ABNORMAL HIGH (ref 11.5–15.5)
WBC: 3.8 10*3/uL — ABNORMAL LOW (ref 4.0–10.5)
nRBC: 0 % (ref 0.0–0.2)

## 2021-06-23 MED ORDER — HEPARIN SOD (PORK) LOCK FLUSH 100 UNIT/ML IV SOLN
500.0000 [IU] | Freq: Once | INTRAVENOUS | Status: DC
  Filled 2021-06-23: qty 5

## 2021-06-23 NOTE — Progress Notes (Signed)
Mb

## 2021-06-23 NOTE — Progress Notes (Signed)
Nutrition  RD planning on seeing patient during infusion but infusion cancelled for today.  Will follow-up on 1/19 at next infusion.  Climmie Cronce B. Zenia Resides, [place], [place] Registered Dietitian [phone] (mobile)

## 2021-06-23 NOTE — Progress Notes (Deleted)
Nutrition  RD planning to see patient in infusion but infusion cancelled for today.  Will plan to follow-up on 1/19 at next infusion.  Rochelle Larue B. Zenia Resides, [place], [place] Registered Dietitian [phone] (mobile)

## 2021-06-23 NOTE — Progress Notes (Signed)
Hematology/Oncology progress note [phone] [phone]   Patient Care Team: [name], MD as PCP - General (Family Medicine) Earlie Server, MD as Consulting Physician (Hematology and Oncology)  REFERRING PROVIDER: [name], MD  CHIEF COMPLAINTS/REASON FOR VISIT:  Follow up for stage IIIA lung cancer  HISTORY OF PRESENTING ILLNESS:  Patient was recently seen by urology Dr. Candiss Norse ski for evaluation of microscopic hematuria, frequent UTI. 11/17/2020, CT hematuria work-up was obtained which showed no evidence of urinary tract calculus or hydronephrosis.  Small left renal cyst. Incidental findings of 1.9 x 1.2 cm left lower lobe pulmonary nodule worrisome for malignancy. Patient was referred to establish care with oncology for further evaluation  .  Patient denies any shortness of breath, cough, hemoptysis, unintentional weight loss, fever or night sweats. She currently smokes 1 cigar/day.  She started smoking cigarettes at age of 27.  She quitted at age of 27 and restarted smoking cigar a few years ago.  Patient has a history of depression and is on Cymbalta.  Diabetes, hyperlipidemia, hypertension, peripheral artery disease Per patient she also has history of heart attack in 2011. History of right lower extremity BKA in 2021, 02/15/2020 history of pulmonary embolism involving segmental and subsegmental branches of right upper, middle, lower lobe pulmonary arteries.  She was put on on apixaban. Patient lives with her aunt.  12/07/2020, PET scan showed left lower lobe 1.3 x 1.8 cm lung nodule with SUV of 6.5.  Suspect early stage primary lung neoplasm.  No hilar or mediastinal lymphadenopathy activity.  # 12/23/2020 S/p CT guided biopsy of left lower lobe mass.  Pathology showed non-small cell carcinoma, favor adenocarcinoma.  Positive for TTF-1, negative for p40.     # 01/17/2021, patient underwent left lower lobectomy with lymph node dissection. Pathology  showed invasive poorly differentiated adenocarcinoma, 2.2 cm.  Tumor abuts pleura but visceral pleural surface is not involved by carcinoma.  LVI invasion is present, resection margins are negative for carcinoma.  10 lymph nodes were harvested and 3 lymph nodes were involved with carcinoma. pT1c pN2 Patient's case was discussed at [facility] oncology tumor board.  And consensus recommendation was adjuvant chemotherapy and sent tissue for molecular/PD-L1 testing.  Foundation one testing showed PD-L1 1,  No reportable alterations ERBB2 [id number]  # 02/28/2021 adjuvant carboplatin and Taxol  INTERVAL HISTORY [name] [name] is a 66 y.o. female who has above history reviewed by me today presents for follow up visit for Stage IIIA.  Patient has high BP in the clinic. Lowest reading was 194/96. She denies headache, focal weakness. She has stress in life. Husband has cancer and is hospitalized.  She reports not taking her medication for the past few days. Later she says she just restarted some medication last night.   Review of Systems  Constitutional:  Negative for appetite change, chills, fatigue and fever.  HENT:   Negative for hearing loss and voice change.   Eyes:  Negative for eye problems.  Respiratory:  Negative for chest tightness and cough.   Cardiovascular:  Negative for chest pain.  Gastrointestinal:  Negative for abdominal distention, abdominal pain, blood in stool, diarrhea and nausea.  Endocrine: Negative for hot flashes.  Genitourinary:  Negative for difficulty urinating, dysuria and frequency.   Musculoskeletal:  Negative for arthralgias.  Skin:  Negative for itching and rash.  Neurological:  Negative for extremity weakness.  Hematological:  Negative for adenopathy.  Psychiatric/Behavioral:  Negative for confusion.    MEDICAL HISTORY:  Past  Medical History:  Diagnosis Date   Anemia    Cancer ([place])    CHF (congestive heart failure) ([place])    02/16/20 HFrEF 20-25% in setting of  acute DVT/PE, underlying CAD   CKD (chronic kidney disease)    Coronary artery disease    02/20/20: CTO pRCA with left-to-right collaterals, 50% mLAD, 80% OM1, medical therapy   Depression    Diabetes ([place])    Diabetes mellitus without complication ([place])    DVT (deep venous thrombosis) ([place]) 02/16/2020   RLE DVT proximal veins 02/16/20 Duplex   Hyperlipidemia    Hypertension    Myocardial infarction [facility]) 2009   Stress related per patient; NSTEMI 2012 100% RCA with left-to-right collaterals   PE (pulmonary thromboembolism) ([place]) 02/15/2020   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, RLE BKA   Peripheral artery disease ([place])     SURGICAL HISTORY: Past Surgical History:  Procedure Laterality Date   Right lower extremity BKA Right     SOCIAL HISTORY: Social History   Socioeconomic History   Marital status: Married    Spouse name: Not on file   Number of children: Not on file   Years of education: Not on file   Highest education level: Not on file  Occupational History   Not on file  Tobacco Use   Smoking status: Former    Packs/day: 0.25    Types: Cigars, Cigarettes    Quit date: 01/17/2021    Years since quitting: 0.4   Smokeless tobacco: Never  Vaping Use   Vaping Use: Never used  Substance and Sexual Activity   Alcohol use: Never   Drug use: Not Currently   Sexual activity: Not Currently  Other Topics Concern   Not on file  Social History Narrative   Not on file   Social Determinants of Health   Financial Resource Strain: Not on file  Food Insecurity: Not on file  Transportation Needs: Not on file  Physical Activity: Not on file  Stress: Not on file  Social Connections: Not on file  Intimate Partner Violence: Not on file    FAMILY HISTORY: Family History  Problem Relation Age of Onset   Heart disease Mother    Diabetes Mother    Heart disease Father    Diabetes Father     ALLERGIES:  is allergic to lisinopril.  MEDICATIONS:  Current  Outpatient Medications  Medication Sig Dispense Refill   ACCU-CHEK GUIDE test strip USE UP TP 4 TIMES A DAY AS DIRECTED 100 strip 1   Accu-Chek Softclix Lancets lancets SMARTSIG:Topical 1-4 Times Daily     aspirin EC 81 MG tablet Take 81 mg by mouth in the morning. Swallow whole.     atorvastatin (LIPITOR) 80 MG tablet Take 1 tablet (80 mg total) by mouth daily. 90 tablet 1   blood glucose meter kit and supplies KIT Dispense based on patient and insurance preference. Use up to four times daily as directed. 1 each 0   carboxymethylcellulose (REFRESH PLUS) 0.5 % SOLN Place 1 drop into both eyes in the morning and at bedtime.     diphenhydrAMINE (BENADRYL) 25 MG tablet Take 25 mg by mouth at bedtime.     docusate sodium (COLACE) 100 MG capsule Take 1 capsule (100 mg total) by mouth 2 (two) times daily. 60 capsule 1   DULoxetine (CYMBALTA) 20 MG capsule TAKE 2 CAPSULES (40 MG TOTAL) BY MOUTH IN THE MORNING AND AT BEDTIME. 180 capsule 2   gabapentin (  NEURONTIN) 100 MG capsule Take 1 capsule (100 mg total) by mouth 2 (two) times daily. 60 capsule 5   HUMALOG KWIKPEN 100 UNIT/ML KwikPen Inject 8 Units into the skin 3 (three) times daily after meals.     isosorbide dinitrate (ISORDIL) 30 MG tablet TAKE 1 TABLET (30 MG TOTAL) BY MOUTH IN THE MORNING, AT NOON, AND AT BEDTIME. 90 tablet 0   JARDIANCE 10 MG TABS tablet TAKE 1 TABLET BY MOUTH EVERY DAY 90 tablet 0   ketoconazole (NIZORAL) 2 % cream Apply 1 application topically in the morning and at bedtime. Applied to feet 60 g 1   LANTUS SOLOSTAR 100 UNIT/ML Solostar Pen 16 Units at bedtime.     metFORMIN (GLUCOPHAGE-XR) 500 MG 24 hr tablet TAKE 1 TABLET BY MOUTH TWICE A DAY 180 tablet 0   metoprolol succinate (TOPROL-XL) 50 MG 24 hr tablet Take 1 tablet (50 mg total) by mouth daily. Take with or immediately following a meal. 90 tablet 1   Multiple Vitamin (MULTIVITAMIN WITH MINERALS) TABS tablet Take 1 tablet by mouth in the morning. Adults 50+      ondansetron (ZOFRAN) 8 MG tablet Take 1 tablet (8 mg total) by mouth 2 (two) times daily as needed (Nausea or vomiting). Start if needed on the third day after cisplatin. 30 tablet 1   potassium chloride (KLOR-CON) 10 MEQ tablet Take 1 tablet (10 mEq total) by mouth daily. 14 tablet 0   prochlorperazine (COMPAZINE) 10 MG tablet Take 1 tablet (10 mg total) by mouth every 6 (six) hours as needed (Nausea or vomiting). 30 tablet 1   spironolactone (ALDACTONE) 25 MG tablet TAKE 1 TABLET (25 MG TOTAL) BY MOUTH DAILY. 90 tablet 0   hydrALAZINE (APRESOLINE) 100 MG tablet Take 1 tablet (100 mg total) by mouth 3 (three) times daily. (Patient not taking: Reported on 06/15/2021) 270 tablet 0   No current facility-administered medications for this visit.     PHYSICAL EXAMINATION: ECOG PERFORMANCE STATUS: 1 - Symptomatic but completely ambulatory Vitals:   06/23/21 0830  BP: (!) 194/96  Pulse: 81  Temp: (!) 96.5 F (35.8 C)   Filed Weights   06/23/21 0830  Weight: 115 lb 11.2 oz (52.5 kg)    Physical Exam Constitutional:      General: She is not in acute distress.    Appearance: She is not ill-appearing.  HENT:     Head: Normocephalic and atraumatic.  Eyes:     General: No scleral icterus. Cardiovascular:     Rate and Rhythm: Normal rate and regular rhythm.     Heart sounds: Normal heart sounds.  Pulmonary:     Effort: Pulmonary effort is normal. No respiratory distress.     Breath sounds: No wheezing.     Comments: Absent breath sounds left basilar Abdominal:     General: Bowel sounds are normal. There is no distension.     Palpations: Abdomen is soft.  Musculoskeletal:        General: No deformity. Normal range of motion.     Cervical back: Normal range of motion and neck supple.     Comments: Right lower extremity BKA  Skin:    General: Skin is warm and dry.     Findings: No erythema or rash.  Neurological:     Mental Status: She is alert and oriented to person, place, and  time. Mental status is at baseline.     Cranial Nerves: No cranial nerve deficit.     Coordination:  Coordination normal.  Psychiatric:        Mood and Affect: Mood normal.    LABORATORY DATA:  I have reviewed the data as listed Lab Results  Component Value Date   WBC 3.8 (L) 06/23/2021   HGB 8.4 (L) 06/23/2021   HCT 27.1 (L) 06/23/2021   MCV 102.7 (H) 06/23/2021   PLT 287 06/23/2021   Recent Labs    08/10/20 1658 11/17/20 1149 05/19/21 0834 06/15/21 1125 06/23/21 0811  NA 136   < > 141 137 139  K 4.6   < > 4.4 4.0 3.6  CL 101   < > 107 104 110  CO2 17*   < > '25 24 24  ' GLUCOSE 485*   < > 66* 220* 120*  BUN 39*   < > 23 28* 24*  CREATININE 1.18*   < > 1.04* 1.23* 1.28*  CALCIUM 9.2   < > 9.0 9.1 8.6*  GFRNONAA 50*   < > >60 49* 47*  GFRAA 57*  --   --   --   --   PROT  --    < > 6.6 6.9 6.1*  ALBUMIN 3.2*   < > 3.4* 3.5 3.0*  AST  --    < > '25 28 20  ' ALT  --    < > '17 21 13  ' ALKPHOS  --    < > 113 138* 107  BILITOT  --    < > 0.5 0.8 0.3   < > = values in this interval not displayed.    Iron/TIBC/Ferritin/ %Sat    Component Value Date/Time   IRON 44 12/30/2020 1437   TIBC 347 12/30/2020 1437   FERRITIN 15 12/30/2020 1437   IRONPCTSAT 13 12/30/2020 1437      RADIOGRAPHIC STUDIES: I have personally reviewed the radiological images as listed and agreed with the findings in the report. CT Chest Wo Contrast  Result Date: 06/09/2021 CLINICAL DATA:  Follow-up lung cancer, status post left lower lobectomy EXAM: CT CHEST WITHOUT CONTRAST TECHNIQUE: Multidetector CT imaging of the chest was performed following the standard protocol without IV contrast. COMPARISON:  PET-CT, 12/07/2020 FINDINGS: Cardiovascular: Aortic atherosclerosis. Normal heart size. Three-vessel coronary artery calcifications. No pericardial effusion. Mediastinum/Nodes: No enlarged mediastinal, hilar, or axillary lymph nodes. Thyroid gland, trachea, and esophagus demonstrate no significant findings.  Lungs/Pleura: Status post interval left lower lobectomy. Trace, loculated left pleural effusion. Mild centrilobular emphysema. Upper Abdomen: No acute abnormality. Musculoskeletal: No chest wall mass or suspicious bone lesions identified. IMPRESSION: 1. Status post interval left lower lobectomy. 2. Trace, loculated left pleural effusion. 3. No evidence of recurrent or metastatic disease in the chest. 4. Emphysema. 5. Coronary artery disease. Aortic Atherosclerosis (ICD10-I70.0) and Emphysema (ICD10-J43.9). Electronically Signed   By: Delanna Ahmadi M.D.   On: 06/09/2021 08:33      ASSESSMENT & PLAN:  1. Non-small cell cancer of left lung ([place])   2. Encounter for antineoplastic chemotherapy   3. Stage 3b chronic kidney disease ([place])   4. Other depression   5. Malignant hypertension   6. Normocytic anemia     Cancer Staging  Non-small cell lung cancer [facility]) Staging form: Lung, AJCC 8th Edition - Pathologic stage from 01/28/2021: Stage IIIA (pT1c, pN2, cM0) - Signed by Earlie Server, MD on 02/17/2021  #Stage IIIA left lung non-small cell lung cancer-.  Poorly differentiated adenocarcinoma.  Status post left lower lobectomy and lymph node dissection pT1c pN2 [pathology addendum changed to N2]  She  has negative surgical margin, no clear benefit of postoperative RT.  S/p 4 cycles of adjuvant chemotherapy Carboplatin and Taxol.  Hold to start first cycle of Tecentriq due to high BP.   # Hypertension emergency She declines going to ER for management.  High BP is likely due to not taking BP medication and we discussed about the importance of medication compliance. Recommend patient to resume her blood pressure medication.  #CKD creatinine is stable.  #Normocytic anemia, probably secondary to chronic kidney disease and chemotherapy.  Hb is stable.     #Depression, she is on Duloxetine. She has major life stress currently I recommend her to see psychiatrist. Patient declined.  # normocytic anemia  hemoglobin 8.4  All questions were answered. The patient knows to call the clinic with any problems questions or concerns.  cc [name], MD   Return of visit:  Lab MD Tecentriq in 1 week.   Earlie Server, MD, PhD 06/23/2021

## 2021-06-25 ENCOUNTER — Other Ambulatory Visit: Payer: Self-pay | Admitting: Family Medicine

## 2021-06-25 DIAGNOSIS — E1151 Type 2 diabetes mellitus with diabetic peripheral angiopathy without gangrene: Secondary | ICD-10-CM

## 2021-06-25 DIAGNOSIS — Z794 Long term (current) use of insulin: Secondary | ICD-10-CM

## 2021-06-25 DIAGNOSIS — I25118 Atherosclerotic heart disease of native coronary artery with other forms of angina pectoris: Secondary | ICD-10-CM

## 2021-06-25 NOTE — Telephone Encounter (Signed)
Requested Prescriptions  Pending Prescriptions Disp Refills   isosorbide dinitrate (ISORDIL) 30 MG tablet [Pharmacy Med Name: ISOSORBIDE DINITRATE 30 MG TAB] 90 tablet 0    Sig: TAKE 1 TABLET (30 MG TOTAL) BY MOUTH IN THE MORNING, AT NOON, AND AT BEDTIME.     Cardiovascular:  Nitrates Failed - 06/25/2021 12:07 PM      Failed - Last BP in normal range    BP Readings from Last 1 Encounters:  06/23/21 (!) 194/96         Passed - Last Heart Rate in normal range    Pulse Readings from Last 1 Encounters:  06/23/21 81         Passed - Valid encounter within last 12 months    Recent Outpatient Visits          1 week ago Primary hypertension   [facility] [name], MD   4 months ago Chronic pain due to neoplasm   [facility] [name], MD   6 months ago Type 2 diabetes mellitus with diabetic peripheral angiopathy without gangrene, with long-term current use of insulin ([place])   [facility] [name], MD   10 months ago Type 2 diabetes mellitus with diabetic peripheral angiopathy without gangrene, with long-term current use of insulin ([place])   [facility] [name], MD   11 months ago Encounter for medical examination to establish care   [place], MD      Future Appointments            In 11 months [name], MD [facility] [place] 10 MG TABS tablet [Pharmacy Med Name: JARDIANCE 10 MG TABLET] 90 tablet 0    Sig: TAKE 1 TABLET BY [place]     Endocrinology:  Diabetes - SGLT2 Inhibitors Failed - 06/25/2021 12:07 PM      Failed - Cr in normal range and within 360 days    Creatinine  Date Value Ref Range Status  04/02/2014 0.75 0.60 - 1.30 mg/dL Final   Creatinine, Ser  Date Value Ref Range Status  06/23/2021 1.28 (H) 0.44 - 1.00 mg/dL Final         Failed - AA eGFR in normal range and within 360 days    EGFR (African American)  Date Value Ref  Range Status  04/02/2014 >60 >66m/min Final  11/11/2012 53 (L)  Final   GFR calc Af Amer  Date Value Ref Range Status  08/10/2020 57 (L) >59 mL/min/1.73 Final    Comment:    **In accordance with recommendations from the NKF-ASN Task force,**   [facility] is in the process of updating its eGFR calculation to the   2021 CKD-EPI creatinine equation that estimates kidney function   without a race variable.    EGFR (Non-African Amer.)  Date Value Ref Range Status  04/02/2014 >60 >677mmin Final    Comment:    eGFR values <6046min/1.73 m2 may be an indication of chronic kidney disease (CKD). Calculated eGFR, using the MRDR Study equation, is useful in  patients with stable renal function. The eGFR calculation will not be reliable in acutely ill patients when serum creatinine is changing rapidly. It is not useful in patients on dialysis. The eGFR calculation may not be applicable to patients at the low and high extremes of body sizes, pregnant women, and vegetarians.  11/11/2012 46 (L)  Final  °  Comment:  °  eGFR values <60mL/min/1.73 m2 may be an indication of chronic °kidney disease (CKD). °Calculated eGFR is useful in patients with stable renal function. °The eGFR calculation will not be reliable in acutely ill patients °when serum creatinine is changing rapidly. It is not useful in  °patients on dialysis. The eGFR calculation may not be applicable °to patients at the low and high extremes of body sizes, pregnant °women, and vegetarians. °  ° °GFR, Estimated  °Date Value Ref Range Status  °06/23/2021 47 (L) >60 mL/min Final  °  Comment:  °  (NOTE) °Calculated using the CKD-EPI Creatinine Equation (2021) °  °   °  °  Passed - LDL in normal range and within 360 days  °  Ldl Cholesterol, Calc  °Date Value Ref Range Status  °04/01/2014 147 (H) 0 - 100 mg/dL Final  ° °LDL Chol Calc (NIH)  °Date Value Ref Range Status  °08/10/2020 111 (H) 0 - 99 mg/dL Final  ° °LDL Cholesterol  °Date Value Ref  Range Status  °01/25/2021 94  Final  °   °  °  Passed - HBA1C is between 0 and 7.9 and within 180 days  °  Hemoglobin A1C  °Date Value Ref Range Status  °01/25/2021 7.8  Final  °   °  °  Passed - Valid encounter within last 6 months  °  Recent Outpatient Visits   °      ° 1 week ago Primary hypertension  ° [facility] Jones, Deanna C, MD  ° 4 months ago Chronic pain due to neoplasm  ° [facility] Jones, Deanna C, MD  ° 6 months ago Type 2 diabetes mellitus with diabetic peripheral angiopathy without gangrene, with long-term current use of insulin (HCC)  ° [facility] Jones, Deanna C, MD  ° 10 months ago Type 2 diabetes mellitus with diabetic peripheral angiopathy without gangrene, with long-term current use of insulin (HCC)  ° [facility] Jones, Deanna C, MD  ° 11 months ago Encounter for medical examination to establish care  ° [facility] Jones, Deanna C, MD  °  °  °Future Appointments   °        ° In 11 months Jones, Deanna C, MD [facility]  °  ° °  °  °  ° ° °

## 2021-06-27 NOTE — Progress Notes (Signed)
Pharmacist Chemotherapy Monitoring - Initial Assessment    Anticipated start date: 07/07/21   The following has been reviewed per standard work regarding the patient's treatment regimen: The patient's diagnosis, treatment plan and drug doses, and organ/hematologic function Lab orders and baseline tests specific to treatment regimen  The treatment plan start date, drug sequencing, and pre-medications Prior authorization status  Patient's documented medication list, including drug-drug interaction screen and prescriptions for anti-emetics and supportive care specific to the treatment regimen The drug concentrations, fluid compatibility, administration routes, and timing of the medications to be used The patient's access for treatment and lifetime cumulative dose history, if applicable  The patient's medication allergies and previous infusion related reactions, if applicable   Changes made to treatment plan:  treatment plan date  Follow up needed:  signing treatment plan   Adelina Mings, [place], 06/27/2021  2:18 PM

## 2021-07-03 ENCOUNTER — Other Ambulatory Visit: Payer: Self-pay | Admitting: Family Medicine

## 2021-07-03 DIAGNOSIS — E1151 Type 2 diabetes mellitus with diabetic peripheral angiopathy without gangrene: Secondary | ICD-10-CM

## 2021-07-03 NOTE — Telephone Encounter (Signed)
Requested Prescriptions  Pending Prescriptions Disp Refills   metFORMIN (GLUCOPHAGE-XR) 500 MG 24 hr tablet [Pharmacy Med Name: METFORMIN HCL ER 500 MG TABLET] 180 tablet 2    Sig: TAKE 1 TABLET BY MOUTH TWICE A DAY     Endocrinology:  Diabetes - Biguanides Failed - 07/03/2021  2:32 PM      Failed - Cr in normal range and within 360 days    Creatinine  Date Value Ref Range Status  04/02/2014 0.75 0.60 - 1.30 mg/dL Final   Creatinine, Ser  Date Value Ref Range Status  06/23/2021 1.28 (H) 0.44 - 1.00 mg/dL Final         Failed - AA eGFR in normal range and within 360 days    EGFR (African American)  Date Value Ref Range Status  04/02/2014 >60 >65m/min Final  11/11/2012 53 (L)  Final   GFR calc Af Amer  Date Value Ref Range Status  08/10/2020 57 (L) >59 mL/min/1.73 Final    Comment:    **In accordance with recommendations from the NKF-ASN Task force,**   [facility] is in the process of updating its eGFR calculation to the   2021 CKD-EPI creatinine equation that estimates kidney function   without a race variable.    EGFR (Non-African Amer.)  Date Value Ref Range Status  04/02/2014 >60 >692mmin Final    Comment:    eGFR values <6036min/1.73 m2 may be an indication of chronic kidney disease (CKD). Calculated eGFR, using the MRDR Study equation, is useful in  patients with stable renal function. The eGFR calculation will not be reliable in acutely ill patients when serum creatinine is changing rapidly. It is not useful in patients on dialysis. The eGFR calculation may not be applicable to patients at the low and high extremes of body sizes, pregnant women, and vegetarians.   11/11/2012 46 (L)  Final    Comment:    eGFR values <43m29mn/1.73 m2 may be an indication of chronic kidney disease (CKD). Calculated eGFR is useful in patients with stable renal function. The eGFR calculation will not be reliable in acutely ill patients when serum creatinine is changing rapidly.  It is not useful in  patients on dialysis. The eGFR calculation may not be applicable to patients at the low and high extremes of body sizes, pregnant women, and vegetarians.    GFR, Estimated  Date Value Ref Range Status  06/23/2021 47 (L) >60 mL/min Final    Comment:    (NOTE) Calculated using the CKD-EPI Creatinine Equation (2021)          Passed - HBA1C is between 0 and 7.9 and within 180 days    Hemoglobin A1C  Date Value Ref Range Status  01/25/2021 7.8  Final         Passed - Valid encounter within last 6 months    Recent Outpatient Visits          2 weeks ago [facility]   [facility]   4 months ago [facility] to [facility]   [facility]   6 months ago Type 2 diabetes mellitus with diabetic peripheral angiopathy without gangrene, with long-term current use of insulin ([facility]   10 months ago Type 2 diabetes mellitus with diabetic peripheral angiopathy without gangrene, with long-term current use of insulin ([facility]   11 months  ago Encounter for medical examination to establish care   [place], MD      Future Appointments            In 11 months [name], MD [facility], [place] [facility]

## 2021-07-07 ENCOUNTER — Inpatient Hospital Stay: Payer: Medicaid Other

## 2021-07-07 ENCOUNTER — Other Ambulatory Visit: Payer: Self-pay

## 2021-07-07 ENCOUNTER — Encounter: Payer: Self-pay | Admitting: Oncology

## 2021-07-07 ENCOUNTER — Inpatient Hospital Stay (HOSPITAL_BASED_OUTPATIENT_CLINIC_OR_DEPARTMENT_OTHER): Payer: Medicaid Other | Admitting: Oncology

## 2021-07-07 VITALS — BP 157/78 | HR 86 | Temp 97.3°F | Wt 118.3 lb

## 2021-07-07 VITALS — BP 127/76 | HR 75 | Resp 18

## 2021-07-07 DIAGNOSIS — C3492 Malignant neoplasm of unspecified part of left bronchus or lung: Secondary | ICD-10-CM

## 2021-07-07 DIAGNOSIS — N1832 Chronic kidney disease, stage 3b: Secondary | ICD-10-CM | POA: Diagnosis not present

## 2021-07-07 DIAGNOSIS — D539 Nutritional anemia, unspecified: Secondary | ICD-10-CM | POA: Diagnosis not present

## 2021-07-07 DIAGNOSIS — Z5112 Encounter for antineoplastic immunotherapy: Secondary | ICD-10-CM | POA: Insufficient documentation

## 2021-07-07 DIAGNOSIS — F3289 Other specified depressive episodes: Secondary | ICD-10-CM

## 2021-07-07 DIAGNOSIS — E119 Type 2 diabetes mellitus without complications: Secondary | ICD-10-CM | POA: Diagnosis not present

## 2021-07-07 DIAGNOSIS — N3942 Incontinence without sensory awareness: Secondary | ICD-10-CM | POA: Diagnosis not present

## 2021-07-07 LAB — CBC WITH DIFFERENTIAL/PLATELET
Abs Immature Granulocytes: 0.02 10*3/uL (ref 0.00–0.07)
Basophils Absolute: 0 10*3/uL (ref 0.0–0.1)
Basophils Relative: 1 %
Eosinophils Absolute: 0.1 10*3/uL (ref 0.0–0.5)
Eosinophils Relative: 2 %
HCT: 25.3 % — ABNORMAL LOW (ref 36.0–46.0)
Hemoglobin: 8 g/dL — ABNORMAL LOW (ref 12.0–15.0)
Immature Granulocytes: 1 %
Lymphocytes Relative: 31 %
Lymphs Abs: 1.4 10*3/uL (ref 0.7–4.0)
MCH: 32.7 pg (ref 26.0–34.0)
MCHC: 31.6 g/dL (ref 30.0–36.0)
MCV: 103.3 fL — ABNORMAL HIGH (ref 80.0–100.0)
Monocytes Absolute: 0.5 10*3/uL (ref 0.1–1.0)
Monocytes Relative: 11 %
Neutro Abs: 2.4 10*3/uL (ref 1.7–7.7)
Neutrophils Relative %: 54 %
Platelets: 324 10*3/uL (ref 150–400)
RBC: 2.45 MIL/uL — ABNORMAL LOW (ref 3.87–5.11)
RDW: 15.9 % — ABNORMAL HIGH (ref 11.5–15.5)
WBC: 4.4 10*3/uL (ref 4.0–10.5)
nRBC: 0 % (ref 0.0–0.2)

## 2021-07-07 LAB — COMPREHENSIVE METABOLIC PANEL
ALT: 18 U/L (ref 0–44)
AST: 27 U/L (ref 15–41)
Albumin: 3.3 g/dL — ABNORMAL LOW (ref 3.5–5.0)
Alkaline Phosphatase: 114 U/L (ref 38–126)
Anion gap: 5 (ref 5–15)
BUN: 20 mg/dL (ref 8–23)
CO2: 24 mmol/L (ref 22–32)
Calcium: 8.6 mg/dL — ABNORMAL LOW (ref 8.9–10.3)
Chloride: 108 mmol/L (ref 98–111)
Creatinine, Ser: 1.39 mg/dL — ABNORMAL HIGH (ref 0.44–1.00)
GFR, Estimated: 43 mL/min — ABNORMAL LOW (ref >60)
Glucose, Bld: 118 mg/dL — ABNORMAL HIGH (ref 70–99)
Potassium: 4.3 mmol/L (ref 3.5–5.1)
Sodium: 137 mmol/L (ref 135–145)
Total Bilirubin: 0.5 mg/dL (ref 0.3–1.2)
Total Protein: 5.9 g/dL — ABNORMAL LOW (ref 6.5–8.1)

## 2021-07-07 MED ORDER — VITAMIN B-12 1000 MCG PO TABS: 1000.0000 ug | ORAL_TABLET | Freq: Every day | ORAL | 2 refills | Status: DC

## 2021-07-07 MED ORDER — SODIUM CHLORIDE 0.9 % IV SOLN
1200.0000 mg | Freq: Once | INTRAVENOUS | Status: AC
  Administered 2021-07-07: 1200 mg via INTRAVENOUS
  Filled 2021-07-07: qty 20

## 2021-07-07 MED ORDER — SODIUM CHLORIDE 0.9 % IV SOLN
Freq: Once | INTRAVENOUS | Status: AC
  Filled 2021-07-07: qty 250

## 2021-07-07 NOTE — Progress Notes (Signed)
Nutrition Follow-up:   Patient with stage II lung cancer.  Patient receiving atezolizumab.    Met with patient during infusion.  Patient reports that her appetite is good.  "I eat all the time."  Says last night for dinner ate steak, fried potatoes and onions and chitlins.  Had leftovers from dinner for breakfast this am.  Says she is drinking 1-2 shakes a day, 3 tends to make her have diarrhea.  Denies any nutrition impact symptoms at this time.    Medications: reviewed  Labs: reviewed  Anthropometrics:   Weight 118 lb 4.8oz today  114 lb on 12/01 115 lb on 11/21 113 lb on 10/25 116 lb on 10/4 110 lb on 9/12   NUTRITION DIAGNOSIS: Inadequate oral intake improving   INTERVENTION:  Continue high calorie, high protein foods. Continue glucerna shake 1-2 times per day as tolerated     MONITORING, EVALUATION, GOAL: weight trends, intake   NEXT VISIT: Thursday, Feb 9th during infusion  [name] [name], [place], [place] Registered Dietitian [phone] (mobile)

## 2021-07-07 NOTE — Patient Instructions (Signed)
[facility] AT [place]  Discharge Instructions: Thank you for choosing [facility] to provide your oncology and hematology care.  If you have a lab appointment with the [facility], please go directly to the [facility] and check in at the registration area.  Wear comfortable clothing and clothing appropriate for easy access to any Portacath or PICC line.   We strive to give you quality time with your provider. You may need to reschedule your appointment if you arrive late (15 or more minutes).  Arriving late affects you and other patients whose appointments are after yours.  Also, if you miss three or more appointments without notifying the office, you may be dismissed from the clinic at the providers discretion.      For prescription refill requests, have your pharmacy contact our office and allow 72 hours for refills to be completed.    Today you received the following chemotherapy and/or immunotherapy agents TECENTRIQ      To help prevent nausea and vomiting after your treatment, we encourage you to take your nausea medication as directed.  BELOW ARE SYMPTOMS THAT SHOULD BE REPORTED IMMEDIATELY: *FEVER GREATER THAN 100.4 F (38 C) OR HIGHER *CHILLS OR SWEATING *NAUSEA AND VOMITING THAT IS NOT CONTROLLED WITH YOUR NAUSEA MEDICATION *UNUSUAL SHORTNESS OF BREATH *UNUSUAL BRUISING OR BLEEDING *URINARY PROBLEMS (pain or burning when urinating, or frequent urination) *BOWEL PROBLEMS (unusual diarrhea, constipation, pain near the anus) TENDERNESS IN MOUTH AND THROAT WITH OR WITHOUT PRESENCE OF ULCERS (sore throat, sores in mouth, or a toothache) UNUSUAL RASH, SWELLING OR PAIN  UNUSUAL VAGINAL DISCHARGE OR ITCHING   Items with * indicate a potential emergency and should be followed up as soon as possible or go to the Emergency Department if any problems should occur.  Please show the CHEMOTHERAPY ALERT CARD or IMMUNOTHERAPY ALERT CARD at check-in to  the Emergency Department and triage nurse.  Should you have questions after your visit or need to cancel or reschedule your appointment, please contact [facility] [place] [facility] [place]  [phone] and follow the prompts.  Office hours are 8:00 a.m. to 4:30 p.m. Monday - Friday. Please note that voicemails left after 4:00 p.m. may not be returned until the following business day.  We are closed weekends and major holidays. You have access to a nurse at all times for urgent questions. Please call the main number to the clinic [phone] and follow the prompts.  For any non-urgent questions, you may also contact your provider using MyChart. We now offer e-Visits for anyone 44 and older to request care online for non-urgent symptoms. For details visit [email or web address].   Also download the MyChart app! Go to the app store, search "MyChart", open the app, select [facility], and log in with your MyChart username and password.  Due to Covid, a mask is required upon entering the hospital/clinic. If you do not have a mask, one will be given to you upon arrival. For doctor visits, patients may have 1 support person aged 3 or older with them. For treatment visits, patients cannot have anyone with them due to current Covid guidelines and our immunocompromised population.   Atezolizumab injection What is this medication? ATEZOLIZUMAB (a te zoe LIZ ue mab) is a monoclonal antibody. It is used to treat bladder cancer (urothelial cancer), liver cancer, lung cancer, and melanoma. This medicine may be used for other purposes; ask your health care provider or pharmacist if you have questions. COMMON BRAND NAME(S): [employer]  What should I tell my care team before I take this medication? They need to know if you have any of these conditions: autoimmune diseases like Crohn's disease, ulcerative colitis, or lupus have had or planning to have an allogeneic stem cell transplant (uses someone else's  stem cells) history of organ transplant history of radiation to the chest nervous system problems like myasthenia gravis or Guillain-Barre syndrome an unusual or allergic reaction to atezolizumab, other medicines, foods, dyes, or preservatives pregnant or trying to get pregnant breast-feeding How should I use this medication? This medicine is for infusion into a vein. It is given by a health care professional in a hospital or clinic setting. A special MedGuide will be given to you before each treatment. Be sure to read this information carefully each time. Talk to your pediatrician regarding the use of this medicine in children. Special care may be needed. Overdosage: If you think you have taken too much of this medicine contact a poison control center or emergency room at once. NOTE: This medicine is only for you. Do not share this medicine with others. What if I miss a dose? It is important not to miss your dose. Call your doctor or health care professional if you are unable to keep an appointment. What may interact with this medication? Interactions have not been studied. This list may not describe all possible interactions. Give your health care provider a list of all the medicines, herbs, non-prescription drugs, or dietary supplements you use. Also tell them if you smoke, drink alcohol, or use illegal drugs. Some items may interact with your medicine. What should I watch for while using this medication? Your condition will be monitored carefully while you are receiving this medicine. You may need blood work done while you are taking this medicine. Do not become pregnant while taking this medicine or for at least 5 months after stopping it. Women should inform their doctor if they wish to become pregnant or think they might be pregnant. There is a potential for serious side effects to an unborn child. Talk to your health care professional or pharmacist for more information. Do not  breast-feed an infant while taking this medicine or for at least 5 months after the last dose. What side effects may I notice from receiving this medication? Side effects that you should report to your doctor or health care professional as soon as possible: allergic reactions like skin rash, itching or hives, swelling of the face, lips, or tongue black, tarry stools bloody or watery diarrhea breathing problems changes in vision chest pain or chest tightness chills facial flushing fever headache signs and symptoms of high blood sugar such as dizziness; dry mouth; dry skin; fruity breath; nausea; stomach pain; increased hunger or thirst; increased urination signs and symptoms of liver injury like dark yellow or brown urine; general ill feeling or flu-like symptoms; light-colored stools; loss of appetite; nausea; right upper belly pain; unusually weak or tired; yellowing of the eyes or skin stomach pain trouble passing urine or change in the amount of urine Side effects that usually do not require medical attention (report to your doctor or health care professional if they continue or are bothersome): bone pain cough diarrhea joint pain muscle pain muscle weakness swelling of arms or legs tiredness weight loss This list may not describe all possible side effects. Call your doctor for medical advice about side effects. You may report side effects to FDA at 1-800-FDA-1088. Where should I keep my medication? This drug  is given in a hospital or clinic and will not be stored at home. NOTE: This sheet is a summary. It may not cover all possible information. If you have questions about this medicine, talk to your doctor, pharmacist, or health care provider.  2022 Elsevier/Gold Standard (2021-02-22 00:00:00)

## 2021-07-07 NOTE — Progress Notes (Signed)
Hematology/Oncology progress note [phone] [phone]   Patient Care Team: [name], MD as PCP - General (Family Medicine) Earlie Server, MD as Consulting Physician (Hematology and Oncology)  REFERRING PROVIDER: [name], MD  CHIEF COMPLAINTS/REASON FOR VISIT:  Follow up for stage IIIA lung cancer  HISTORY OF PRESENTING ILLNESS:  Patient was recently seen by urology Dr. Candiss Norse ski for evaluation of microscopic hematuria, frequent UTI. 11/17/2020, CT hematuria work-up was obtained which showed no evidence of urinary tract calculus or hydronephrosis.  Small left renal cyst. Incidental findings of 1.9 x 1.2 cm left lower lobe pulmonary nodule worrisome for malignancy. Patient was referred to establish care with oncology for further evaluation  .  Patient denies any shortness of breath, cough, hemoptysis, unintentional weight loss, fever or night sweats. She currently smokes 1 cigar/day.  She started smoking cigarettes at age of 73.  She quitted at age of 39 and restarted smoking cigar a few years ago.  Patient has a history of depression and is on Cymbalta.  Diabetes, hyperlipidemia, hypertension, peripheral artery disease Per patient she also has history of heart attack in 2011. History of right lower extremity BKA in 2021, 02/15/2020 history of pulmonary embolism involving segmental and subsegmental branches of right upper, middle, lower lobe pulmonary arteries.  She was put on on apixaban. Patient lives with her aunt.  12/07/2020, PET scan showed left lower lobe 1.3 x 1.8 cm lung nodule with SUV of 6.5.  Suspect early stage primary lung neoplasm.  No hilar or mediastinal lymphadenopathy activity.  # 12/23/2020 S/p CT guided biopsy of left lower lobe mass.  Pathology showed non-small cell carcinoma, favor adenocarcinoma.  Positive for TTF-1, negative for p40.     # 01/17/2021, patient underwent left lower lobectomy with lymph node dissection. Pathology  showed invasive poorly differentiated adenocarcinoma, 2.2 cm.  Tumor abuts pleura but visceral pleural surface is not involved by carcinoma.  LVI invasion is present, resection margins are negative for carcinoma.  10 lymph nodes were harvested and 3 lymph nodes were involved with carcinoma. pT1c pN2 Patient's case was discussed at [facility] oncology tumor board.  And consensus recommendation was adjuvant chemotherapy and sent tissue for molecular/PD-L1 testing.  Foundation one testing showed PD-L1 1,  No reportable alterations ERBB2 S310F  # 02/28/2021 adjuvant carboplatin and Taxol  INTERVAL HISTORY [name] [name] is a 66 y.o. female who has above history reviewed by me today presents for follow up visit for Stage IIIA.  BP is better today 157/78, she feels well.  No new complaints.   Review of Systems  Constitutional:  Negative for appetite change, chills, fatigue and fever.  HENT:   Negative for hearing loss and voice change.   Eyes:  Negative for eye problems.  Respiratory:  Negative for chest tightness and cough.   Cardiovascular:  Negative for chest pain.  Gastrointestinal:  Negative for abdominal distention, abdominal pain, blood in stool, diarrhea and nausea.  Endocrine: Negative for hot flashes.  Genitourinary:  Negative for difficulty urinating, dysuria and frequency.   Musculoskeletal:  Negative for arthralgias.  Skin:  Negative for itching and rash.  Neurological:  Negative for extremity weakness.  Hematological:  Negative for adenopathy.  Psychiatric/Behavioral:  Negative for confusion.    MEDICAL HISTORY:  Past Medical History:  Diagnosis Date   Anemia    Cancer ([place])    CHF (congestive heart failure) ([place])    02/16/20 HFrEF 20-25% in setting of acute DVT/PE, underlying CAD   CKD (  chronic kidney disease)    Coronary artery disease    02/20/20: CTO pRCA with left-to-right collaterals, 50% mLAD, 80% OM1, medical therapy   Depression    Diabetes ([place])    Diabetes  mellitus without complication ([place])    DVT (deep venous thrombosis) ([place]) 02/16/2020   RLE DVT proximal veins 02/16/20 Duplex   Hyperlipidemia    Hypertension    Myocardial infarction [facility]) 2009   Stress related per patient; NSTEMI 2012 100% RCA with left-to-right collaterals   PE (pulmonary thromboembolism) ([place]) 02/15/2020   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, RLE BKA   Peripheral artery disease ([place])     SURGICAL HISTORY: Past Surgical History:  Procedure Laterality Date   Right lower extremity BKA Right     SOCIAL HISTORY: Social History   Socioeconomic History   Marital status: Married    Spouse name: Not on file   Number of children: Not on file   Years of education: Not on file   Highest education level: Not on file  Occupational History   Not on file  Tobacco Use   Smoking status: Former    Packs/day: 0.25    Types: Cigars, Cigarettes    Quit date: 01/17/2021    Years since quitting: 0.4   Smokeless tobacco: Never  Vaping Use   Vaping Use: Never used  Substance and Sexual Activity   Alcohol use: Never   Drug use: Not Currently   Sexual activity: Not Currently  Other Topics Concern   Not on file  Social History Narrative   Not on file   Social Determinants of Health   Financial Resource Strain: Not on file  Food Insecurity: Not on file  Transportation Needs: Not on file  Physical Activity: Not on file  Stress: Not on file  Social Connections: Not on file  Intimate Partner Violence: Not on file    FAMILY HISTORY: Family History  Problem Relation Age of Onset   Heart disease Mother    Diabetes Mother    Heart disease Father    Diabetes Father     ALLERGIES:  is allergic to lisinopril.  MEDICATIONS:  Current Outpatient Medications  Medication Sig Dispense Refill   ACCU-CHEK GUIDE test strip USE UP TP 4 TIMES A DAY AS DIRECTED 100 strip 1   Accu-Chek Softclix Lancets lancets SMARTSIG:Topical 1-4 Times Daily     aspirin EC 81 MG  tablet Take 81 mg by mouth in the morning. Swallow whole.     atorvastatin (LIPITOR) 80 MG tablet Take 1 tablet (80 mg total) by mouth daily. 90 tablet 1   blood glucose meter kit and supplies KIT Dispense based on patient and insurance preference. Use up to four times daily as directed. 1 each 0   carboxymethylcellulose (REFRESH PLUS) 0.5 % SOLN Place 1 drop into both eyes in the morning and at bedtime.     diphenhydrAMINE (BENADRYL) 25 MG tablet Take 25 mg by mouth at bedtime.     docusate sodium (COLACE) 100 MG capsule Take 1 capsule (100 mg total) by mouth 2 (two) times daily. 60 capsule 1   DULoxetine (CYMBALTA) 20 MG capsule TAKE 2 CAPSULES (40 MG TOTAL) BY MOUTH IN THE MORNING AND AT BEDTIME. 180 capsule 2   gabapentin (NEURONTIN) 100 MG capsule Take 1 capsule (100 mg total) by mouth 2 (two) times daily. 60 capsule 5   HUMALOG KWIKPEN 100 UNIT/ML KwikPen Inject 8 Units into the skin 3 (three) times daily after  meals.     isosorbide dinitrate (ISORDIL) 30 MG tablet TAKE 1 TABLET (30 MG TOTAL) BY MOUTH IN THE MORNING, AT NOON, AND AT BEDTIME. 90 tablet 0   JARDIANCE 10 MG TABS tablet TAKE 1 TABLET BY MOUTH EVERY DAY 90 tablet 0   ketoconazole (NIZORAL) 2 % cream Apply 1 application topically in the morning and at bedtime. Applied to feet 60 g 1   LANTUS SOLOSTAR 100 UNIT/ML Solostar Pen 16 Units at bedtime.     metFORMIN (GLUCOPHAGE-XR) 500 MG 24 hr tablet TAKE 1 TABLET BY MOUTH TWICE A DAY 180 tablet 2   metoprolol succinate (TOPROL-XL) 50 MG 24 hr tablet Take 1 tablet (50 mg total) by mouth daily. Take with or immediately following a meal. 90 tablet 1   Multiple Vitamin (MULTIVITAMIN WITH MINERALS) TABS tablet Take 1 tablet by mouth in the morning. Adults 50+     ondansetron (ZOFRAN) 8 MG tablet Take 1 tablet (8 mg total) by mouth 2 (two) times daily as needed (Nausea or vomiting). Start if needed on the third day after cisplatin. 30 tablet 1   potassium chloride (KLOR-CON) 10 MEQ tablet Take  1 tablet (10 mEq total) by mouth daily. 14 tablet 0   prochlorperazine (COMPAZINE) 10 MG tablet Take 1 tablet (10 mg total) by mouth every 6 (six) hours as needed (Nausea or vomiting). 30 tablet 1   spironolactone (ALDACTONE) 25 MG tablet TAKE 1 TABLET (25 MG TOTAL) BY MOUTH DAILY. 90 tablet 0   hydrALAZINE (APRESOLINE) 100 MG tablet Take 1 tablet (100 mg total) by mouth 3 (three) times daily. (Patient not taking: Reported on 06/15/2021) 270 tablet 0   No current facility-administered medications for this visit.     PHYSICAL EXAMINATION: ECOG PERFORMANCE STATUS: 1 - Symptomatic but completely ambulatory Vitals:   07/07/21 0928  BP: (!) 157/78  Pulse: 86  Temp: (!) 97.3 F (36.3 C)   Filed Weights   07/07/21 0928  Weight: 118 lb 4.8 oz (53.7 kg)    Physical Exam Constitutional:      General: She is not in acute distress.    Appearance: She is not ill-appearing.  HENT:     Head: Normocephalic and atraumatic.  Eyes:     General: No scleral icterus. Cardiovascular:     Rate and Rhythm: Normal rate and regular rhythm.     Heart sounds: Normal heart sounds.  Pulmonary:     Effort: Pulmonary effort is normal. No respiratory distress.     Breath sounds: No wheezing.     Comments: Absent breath sounds left basilar Abdominal:     General: Bowel sounds are normal. There is no distension.     Palpations: Abdomen is soft.  Musculoskeletal:        General: No deformity. Normal range of motion.     Cervical back: Normal range of motion and neck supple.     Comments: Right lower extremity BKA  Skin:    General: Skin is warm and dry.     Findings: No erythema or rash.  Neurological:     Mental Status: She is alert and oriented to person, place, and time. Mental status is at baseline.     Cranial Nerves: No cranial nerve deficit.     Coordination: Coordination normal.  Psychiatric:        Mood and Affect: Mood normal.    LABORATORY DATA:  I have reviewed the data as listed Lab  Results  Component Value Date  WBC 3.8 (L) 06/23/2021   HGB 8.4 (L) 06/23/2021   HCT 27.1 (L) 06/23/2021   MCV 102.7 (H) 06/23/2021   PLT 287 06/23/2021   Recent Labs    08/10/20 1658 11/17/20 1149 05/19/21 0834 06/15/21 1125 06/23/21 0811  NA 136   < > 141 137 139  K 4.6   < > 4.4 4.0 3.6  CL 101   < > 107 104 110  CO2 17*   < > '25 24 24  ' GLUCOSE 485*   < > 66* 220* 120*  BUN 39*   < > 23 28* 24*  CREATININE 1.18*   < > 1.04* 1.23* 1.28*  CALCIUM 9.2   < > 9.0 9.1 8.6*  GFRNONAA 50*   < > >60 49* 47*  GFRAA 57*  --   --   --   --   PROT  --    < > 6.6 6.9 6.1*  ALBUMIN 3.2*   < > 3.4* 3.5 3.0*  AST  --    < > '25 28 20  ' ALT  --    < > '17 21 13  ' ALKPHOS  --    < > 113 138* 107  BILITOT  --    < > 0.5 0.8 0.3   < > = values in this interval not displayed.    Iron/TIBC/Ferritin/ %Sat    Component Value Date/Time   IRON 44 12/30/2020 1437   TIBC 347 12/30/2020 1437   FERRITIN 15 12/30/2020 1437   IRONPCTSAT 13 12/30/2020 1437      RADIOGRAPHIC STUDIES: I have personally reviewed the radiological images as listed and agreed with the findings in the report. CT Chest Wo Contrast  Result Date: 06/09/2021 CLINICAL DATA:  Follow-up lung cancer, status post left lower lobectomy EXAM: CT CHEST WITHOUT CONTRAST TECHNIQUE: Multidetector CT imaging of the chest was performed following the standard protocol without IV contrast. COMPARISON:  PET-CT, 12/07/2020 FINDINGS: Cardiovascular: Aortic atherosclerosis. Normal heart size. Three-vessel coronary artery calcifications. No pericardial effusion. Mediastinum/Nodes: No enlarged mediastinal, hilar, or axillary lymph nodes. Thyroid gland, trachea, and esophagus demonstrate no significant findings. Lungs/Pleura: Status post interval left lower lobectomy. Trace, loculated left pleural effusion. Mild centrilobular emphysema. Upper Abdomen: No acute abnormality. Musculoskeletal: No chest wall mass or suspicious bone lesions identified.  IMPRESSION: 1. Status post interval left lower lobectomy. 2. Trace, loculated left pleural effusion. 3. No evidence of recurrent or metastatic disease in the chest. 4. Emphysema. 5. Coronary artery disease. Aortic Atherosclerosis (ICD10-I70.0) and Emphysema (ICD10-J43.9). Electronically Signed   By: Delanna Ahmadi M.D.   On: 06/09/2021 08:33      ASSESSMENT & PLAN:  1. Non-small cell cancer of left lung ([place])   2. Encounter for antineoplastic immunotherapy     Cancer Staging  Non-small cell lung cancer [facility]) Staging form: Lung, AJCC 8th Edition - Pathologic stage from 01/28/2021: Stage IIIA (pT1c, pN2, cM0) - Signed by Earlie Server, MD on 02/17/2021  #Stage IIIA left lung non-small cell lung cancer-.  Poorly differentiated adenocarcinoma.  Status post left lower lobectomy and lymph node dissection pT1c pN2 [pathology addendum changed to N2]  She has negative surgical margin, no clear benefit of postoperative RT.  S/p 4 cycles of adjuvant chemotherapy Carboplatin and Taxol.   I discussed the mechanism of action and rationale of using immunotherapy.  Discussed the potential side effects of immunotherapy including but not limited to diarrhea; skin rash; respiratory failure, kidney failure, mental status change, elevated LFTs/liver failure,endocrine abnormalities, acute  deterioration  and even death,etc. patient voices understanding and agrees with proceeding with treatment. Proceed with cycle 1 Tecentriq.  She does not have good IV access.  Refer to IR for Mediport placement.  # Macrocytic anemia, CKD will check B12 and folate at next week. Recommend patient to start empiric B12 1055mg supplementation.  # Hypertension better controlled today. Recommend patient to keep taking her BP meds.   #CKD creatinine is stable.   #Depression, she is on Duloxetine. She has major life stress currently I recommend her to see psychiatrist. Patient declined.    All questions were answered. The patient knows to  call the clinic with any problems questions or concerns.  cc JJuline Patch MD   Return of visit:  Lab MD Tecentriq in 3 weeks.   ZEarlie Server MD, PhD 07/07/2021

## 2021-07-11 ENCOUNTER — Telehealth: Payer: Self-pay | Admitting: *Deleted

## 2021-07-11 NOTE — Telephone Encounter (Signed)
Patient called reporting that since her first Immunotherapy infusion 1/19, she is having nausea, diarrhea and anorexia. She is asking for something to be done for her. Please advise

## 2021-07-11 NOTE — Telephone Encounter (Signed)
Spoke to patient. She has tried both antiemetics, but states they are not helping much. She is agreeable to [facility], but due to transportation, she is unable to come in until tomorrow AM. Appointments scheduled.

## 2021-07-12 ENCOUNTER — Telehealth: Payer: Self-pay

## 2021-07-12 ENCOUNTER — Inpatient Hospital Stay: Payer: Medicaid Other

## 2021-07-12 ENCOUNTER — Inpatient Hospital Stay: Payer: Medicaid Other | Admitting: Hospice and Palliative Medicine

## 2021-07-12 NOTE — Telephone Encounter (Signed)
Called to check on patient due to missed [facility] appointment. She stated that her ride did not show up. She reported that she was feeling much better today. She is taking nausea medication and drinking fluids, and reports that the medication is helping. She also stated that diarrhea had stopped. She didn't feel that she needed to come in, but pt was encouraged to call back if symptoms returned.

## 2021-07-15 ENCOUNTER — Other Ambulatory Visit: Payer: Self-pay | Admitting: Family Medicine

## 2021-07-15 ENCOUNTER — Telehealth: Payer: Self-pay

## 2021-07-15 DIAGNOSIS — I25118 Atherosclerotic heart disease of native coronary artery with other forms of angina pectoris: Secondary | ICD-10-CM

## 2021-07-15 NOTE — Telephone Encounter (Addendum)
Patient called [name] [name] with speciality scheduling to cancel appt for her Port placement for tomorrow, wed 07/20/2021, due to transportation issues. Informed patient they moved her appt to Wednesday 07/27/2021. Patient agreed she would be there. Patient verbalized understanding.   [name] [name] with specialty scheduling wanting patient to come in 30 min earlier to her appt on Wed, 07/20/2021 for her port. Called and spoke with patient, she verified that would be fine. Informed [name] [name] patient is ok with moving appt time up.

## 2021-07-15 NOTE — Progress Notes (Signed)
Patient on schedule for Port placement 07/20/2021, called and spoke with [name] [name]/contact,since unable to get patient to answer. Pre procedure instructions given. Made aware to be here @ 0900, NPO after [place] prior to procedure as well as driver post procedure/recovery/discharge. Stated understanding.

## 2021-07-19 ENCOUNTER — Other Ambulatory Visit: Payer: Self-pay | Admitting: Radiology

## 2021-07-19 ENCOUNTER — Telehealth: Payer: Self-pay

## 2021-07-19 NOTE — Telephone Encounter (Signed)
Spoke to patient and informed her of new port placement appt for wed, 07/27/2021. Patient verbalized understanding.

## 2021-07-20 ENCOUNTER — Ambulatory Visit
Admission: RE | Admit: 2021-07-20 | Discharge: 2021-07-20 | Disposition: A | Discharge status: Home / Self Care | Payer: Medicaid Other | Source: Ambulatory Visit | Attending: Oncology | Admitting: Oncology

## 2021-07-23 ENCOUNTER — Other Ambulatory Visit: Payer: Self-pay | Admitting: Family Medicine

## 2021-07-23 DIAGNOSIS — B353 Tinea pedis: Secondary | ICD-10-CM

## 2021-07-26 ENCOUNTER — Other Ambulatory Visit: Payer: Self-pay | Admitting: Student

## 2021-07-27 ENCOUNTER — Ambulatory Visit
Admission: RE | Admit: 2021-07-27 | Discharge: 2021-07-27 | Disposition: A | Discharge status: Home / Self Care | Payer: Medicaid Other | Source: Ambulatory Visit | Attending: Oncology | Admitting: Oncology

## 2021-07-27 ENCOUNTER — Encounter: Payer: Self-pay | Admitting: Radiology

## 2021-07-27 ENCOUNTER — Other Ambulatory Visit: Payer: Self-pay

## 2021-07-27 DIAGNOSIS — Z87891 Personal history of nicotine dependence: Secondary | ICD-10-CM | POA: Insufficient documentation

## 2021-07-27 DIAGNOSIS — E785 Hyperlipidemia, unspecified: Secondary | ICD-10-CM | POA: Diagnosis not present

## 2021-07-27 DIAGNOSIS — N189 Chronic kidney disease, unspecified: Secondary | ICD-10-CM | POA: Diagnosis not present

## 2021-07-27 DIAGNOSIS — I130 Hypertensive heart and chronic kidney disease with heart failure and stage 1 through stage 4 chronic kidney disease, or unspecified chronic kidney disease: Secondary | ICD-10-CM | POA: Diagnosis not present

## 2021-07-27 DIAGNOSIS — C3492 Malignant neoplasm of unspecified part of left bronchus or lung: Secondary | ICD-10-CM | POA: Insufficient documentation

## 2021-07-27 DIAGNOSIS — I5022 Chronic systolic (congestive) heart failure: Secondary | ICD-10-CM | POA: Diagnosis not present

## 2021-07-27 DIAGNOSIS — I252 Old myocardial infarction: Secondary | ICD-10-CM | POA: Diagnosis not present

## 2021-07-27 DIAGNOSIS — E1151 Type 2 diabetes mellitus with diabetic peripheral angiopathy without gangrene: Secondary | ICD-10-CM | POA: Diagnosis not present

## 2021-07-27 HISTORY — PX: IR IMAGING GUIDED PORT INSERTION: IMG5740

## 2021-07-27 LAB — GLUCOSE, CAPILLARY: Glucose-Capillary: 80 mg/dL (ref 70–99)

## 2021-07-27 MED ORDER — MIDAZOLAM HCL 2 MG/2ML IJ SOLN
INTRAMUSCULAR | Status: AC | PRN
  Administered 2021-07-27: 1 mg via INTRAVENOUS

## 2021-07-27 MED ORDER — LIDOCAINE-EPINEPHRINE 2 %-1:50000 IJ SOLN
INTRAMUSCULAR | Status: AC | PRN
  Administered 2021-07-27: 10 mL via INTRADERMAL

## 2021-07-27 MED ORDER — SODIUM CHLORIDE 0.9 % IV SOLN
INTRAVENOUS | Status: DC
  Filled 2021-07-27: qty 1000

## 2021-07-27 MED ORDER — DEXTROSE 50 % IV SOLN
25.0000 mL | Freq: Once | INTRAVENOUS | Status: AC
  Filled 2021-07-27: qty 50

## 2021-07-27 MED ORDER — DEXTROSE 50 % IV SOLN
INTRAVENOUS | Status: AC
  Administered 2021-07-27: 25 mL via INTRAVENOUS
  Filled 2021-07-27: qty 50

## 2021-07-27 MED ORDER — FENTANYL CITRATE (PF) 100 MCG/2ML IJ SOLN
INTRAMUSCULAR | Status: AC | PRN
Start: 2021-07-27 — End: 2021-07-27
  Administered 2021-07-27: 50 ug via INTRAVENOUS

## 2021-07-27 MED ORDER — HEPARIN SOD (PORK) LOCK FLUSH 100 UNIT/ML IV SOLN
INTRAVENOUS | Status: AC | PRN
  Administered 2021-07-27: 500 [IU] via INTRAVENOUS

## 2021-07-27 NOTE — H&P (Signed)
Chief Complaint: Patient was seen in consultation today for port placement.  Referring Physician(s): Yu,Zhou  Supervising Physician: Juliet Rude  Patient Status: [facility] - Out-pt  History of Present Illness: [name] [name] is a 66 y.o. female with a past medical history significant for depression, CKD, CHF, HTN, HLD, NSTEMI, PE, DVT, PAD, anemia, DM and non small cell lung cancer who presents today for port placement with moderate sedation. [name] [name] underwent a CT hematuria workup on 11/17/20 and was incidentally found to have a 1.9 x 1.2 cm left lower lobe pulmonary nodule worrisome malignancy. She was referred to oncology and underwent PET scan on 12/08/20 which noted hypermetabolic activity in the left lower lobe nodule. She then underwent a biopsy of this nodule with IR on 7/7, pathology confirmed non-small cell carcinoma. She is planned to undergo systemic therapy and IR has been consulted for port placement for durable venous access.  [name] [name] reports feeling well overall except this morning she had some confusion and blurry vision which she attributes to waking up late and rushing around and not having anything to eat or drink since yesterday. She has not had any recurrence of these symptoms. She denies any other complaints today and is ready to proceed.  Past Medical History:  Diagnosis Date   Anemia    Cancer ([place])    CHF (congestive heart failure) ([place])    02/16/20 HFrEF 20-25% in setting of acute DVT/PE, underlying CAD   CKD (chronic kidney disease)    Coronary artery disease    02/20/20: CTO pRCA with left-to-right collaterals, 50% mLAD, 80% OM1, medical therapy   Depression    Diabetes ([place])    Diabetes mellitus without complication ([place])    DVT (deep venous thrombosis) ([place]) 02/16/2020   RLE DVT proximal veins 02/16/20 Duplex   Hyperlipidemia    Hypertension    Myocardial infarction [facility]) 2009   Stress related per patient; NSTEMI 2012 100% RCA with left-to-right  collaterals   PE (pulmonary thromboembolism) ([place]) 02/15/2020   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 Authorizing Provider  aspirin EC 81 MG tablet Take 81 mg by mouth in the morning. Swallow whole.   Yes [provider]  atorvastatin (LIPITOR) 80 MG tablet Take 1 tablet (80 mg total) by mouth daily. 02/10/21  Yes [name], MD  diphenhydrAMINE (BENADRYL) 25 MG tablet Take 25 mg by mouth at bedtime.   Yes [provider]  DULoxetine (CYMBALTA) 20 MG capsule TAKE 2 CAPSULES (40 MG TOTAL) BY MOUTH IN THE MORNING AND AT BEDTIME. 03/04/21  Yes [name], MD  gabapentin (NEURONTIN) 100 MG capsule Take 1 capsule (100 mg total) by mouth 2 (two) times daily. 12/21/20  Yes [name], MD  HUMALOG KWIKPEN 100 UNIT/ML KwikPen Inject 8 Units into the skin 3 (three) times daily after meals. 01/04/21  Yes [provider]  isosorbide dinitrate (ISORDIL) 30 MG tablet TAKE 1 TABLET (30 MG TOTAL) BY MOUTH IN THE MORNING, AT NOON, AND AT BEDTIME. 06/25/21  Yes [name], MD  JARDIANCE 10 MG TABS tablet TAKE 1 TABLET BY MOUTH EVERY DAY 06/25/21  Yes [name], MD  metFORMIN (GLUCOPHAGE-XR) 500 MG 24 hr tablet TAKE 1 TABLET BY MOUTH TWICE A DAY 07/03/21  Yes [name], MD  metoprolol succinate (TOPROL-XL) 50 MG 24 hr tablet Take 1 tablet (50 mg total) by mouth daily. Take with or immediately following a meal. 02/10/21  Yes [name], MD  Multiple Vitamin (MULTIVITAMIN WITH MINERALS) TABS tablet Take 1 tablet by mouth in the morning. Adults 50+   Yes [provider]  potassium chloride (KLOR-CON) 10 MEQ tablet Take 1 tablet (10 mEq total) by mouth daily. 04/12/21  Yes Earlie Server, MD   prochlorperazine (COMPAZINE) 10 MG tablet Take 1 tablet (10 mg total) by mouth every 6 (six) hours as needed (Nausea or vomiting). 02/17/21  Yes Earlie Server, MD  spironolactone (ALDACTONE) 25 MG tablet TAKE 1 TABLET (25 MG TOTAL) BY MOUTH DAILY. 07/15/21  Yes [name], MD  vitamin B-12 (CYANOCOBALAMIN) 1000 MCG tablet Take 1 tablet (1,000 mcg total) by mouth daily. 07/07/21  Yes Earlie Server, MD  ACCU-CHEK GUIDE test strip USE UP TP 4 TIMES A DAY AS DIRECTED 01/04/21   [name], MD  Accu-Chek Softclix Lancets lancets SMARTSIG:Topical 1-4 Times Daily 11/22/20   [provider]  blood glucose meter kit and supplies KIT Dispense based on patient and insurance preference. Use up to four times daily as directed. 11/22/20   [name], MD  carboxymethylcellulose (REFRESH PLUS) 0.5 % SOLN Place 1 drop into both eyes in the morning and at bedtime.    [provider]  docusate sodium (COLACE) 100 MG capsule Take 1 capsule (100 mg total) by mouth 2 (two) times daily. 02/17/21   Earlie Server, MD  hydrALAZINE (APRESOLINE) 100 MG tablet Take 1 tablet (100 mg total) by mouth 3 (three) times daily. Patient not taking: Reported on 06/15/2021 02/10/21   [name], MD  ketoconazole (NIZORAL) 2 % cream APPLY 1 APPLICATION TOPICALLY IN THE MORNING AND AT BEDTIME. APPLIED TO FEET 07/25/21   [name], MD  LANTUS SOLOSTAR 100 UNIT/ML Solostar Pen 16 Units at bedtime. 08/10/20   [provider]  ondansetron (ZOFRAN) 8 MG tablet Take 1 tablet (8 mg total) by mouth 2 (two) times daily as needed (Nausea or vomiting). Start if needed on the third day after cisplatin. 02/17/21   Earlie Server, MD     Family History  Problem Relation Age of Onset   Heart disease Mother    Diabetes Mother    Heart disease Father    Diabetes Father     Social History   Socioeconomic History   Marital status: Married    Spouse name: Not on file   Number of children: Not on file   Years of education: Not on  file   Highest education level: Not on file  Occupational History   Not on file  Tobacco Use   Smoking status: Former    Packs/day: 0.25    Types: Cigars, Cigarettes    Quit date: 01/17/2021    Years since quitting: 0.5   Smokeless tobacco: Never  Vaping Use   Vaping Use: Never used  Substance and Sexual Activity   Alcohol use: Never   Drug use: Not Currently   Sexual activity: Not Currently  Other Topics Concern   Not on file  Social History Narrative   Not on file   Social Determinants of Health   Financial Resource Strain: Not on file  Food Insecurity: Not on file  Transportation Needs:  Not on file  Physical Activity: Not on file  Stress: Not on file  Social Connections: Not on file     Review of Systems: A 12 point ROS discussed and pertinent positives are indicated in the HPI above.  All other systems are negative.  Review of Systems  Constitutional:  Negative for chills and fever.  Respiratory:  Negative for cough and shortness of breath.   Cardiovascular:  Negative for chest pain.  Gastrointestinal:  Negative for abdominal pain, diarrhea and vomiting.  Musculoskeletal:  Negative for back pain.  Neurological:  Negative for dizziness and headaches.   Vital Signs: BP (!) 86/56    Pulse 77    Temp 97.7 F (36.5 C) (Oral)    Resp 18    Ht 5' 5" (1.651 m)    Wt 114 lb (51.7 kg)    SpO2 99%    BMI 18.97 kg/m   Physical Exam Vitals reviewed.  Constitutional:      General: She is not in acute distress. HENT:     Head: Normocephalic.     Mouth/Throat:     Mouth: Mucous membranes are moist.     Pharynx: Oropharynx is clear. No oropharyngeal exudate or posterior oropharyngeal erythema.  Cardiovascular:     Rate and Rhythm: Normal rate and regular rhythm.  Pulmonary:     Effort: Pulmonary effort is normal.     Breath sounds: No wheezing, rhonchi or rales.     Comments: Wet lung sounds bilateral lower lobes Abdominal:     General: There is no distension.      Palpations: Abdomen is soft.     Tenderness: There is no abdominal tenderness.  Musculoskeletal:     Right lower leg: No edema.     Left lower leg: No edema.  Skin:    General: Skin is warm and dry.  Neurological:     Mental Status: She is alert and oriented to person, place, and time.  Psychiatric:        Mood and Affect: Mood normal.        Behavior: Behavior normal.        Thought Content: Thought content normal.        Judgment: Judgment normal.     MD Evaluation Airway: WNL Heart: WNL Abdomen: WNL Chest/ Lungs: WNL ASA  Classification: 2 Mallampati/Airway Score: Two   Imaging: No results found.  Labs:  CBC: Recent Labs    05/19/21 0834 06/15/21 1125 06/23/21 0811 07/07/21 0919  WBC 4.1 3.6* 3.8* 4.4  HGB 9.0* 9.5* 8.4* 8.0*  HCT 28.8* 30.3* 27.1* 25.3*  PLT 366 203 287 324    COAGS: Recent Labs    12/23/20 0856 01/13/21 0900  INR 1.0 0.9  APTT  --  23*    BMP: Recent Labs    08/10/20 1658 11/17/20 1149 05/19/21 0834 06/15/21 1125 06/23/21 0811 07/07/21 0919  NA 136   < > 141 137 139 137  K 4.6   < > 4.4 4.0 3.6 4.3  CL 101   < > 107 104 110 108  CO2 17*   < > _0 GLUCOSE 485*   < > 66* 220* 120* 118*  BUN 39*   < > 23 28* 24* 20  CALCIUM 9.2   < > 9.0 9.1 8.6* 8.6*  CREATININE 1.18*   < > 1.04* 1.23* 1.28* 1.39*  GFRNONAA 50*   < > >60 49* 47* 43*  GFRAA 57*  --   --   --   --   --    < > =  values in this interval not displayed.    LIVER FUNCTION TESTS: Recent Labs    05/19/21 0834 06/15/21 1125 06/23/21 0811 07/07/21 0919  BILITOT 0.5 0.8 0.3 0.5  AST _0 ALT _1 ALKPHOS 113 138* 107 114  PROT 6.6 6.9 6.1* 5.9*  ALBUMIN 3.4* 3.5 3.0* 3.3*    TUMOR MARKERS: No results for input(s): AFPTM, CEA, CA199, CHROMGRNA in the last 8760 hours.  Assessment and Plan:  66 y/o F with recently diagnosed non-small cell carcinoma of the left lung planned for systemic therapy who presents today for port  placement for durable venous access.  Patient noted to be hypotensive on initial evaluation, per patient and chart review SBP typically 120-140 mmHg. Patient with mild wet lung sounds bilaterally but no peripheral edema or other concerns for acute CHF exacerbation -- will give 500 cc NS and reassess BP prior to procedure. Additionally patient noted concern for hypoglycemia - initial CBG 80 however patient reports her blood sugar tends to drop quickly -- will give 1/2 amp D5 and repeat CBG prior to procedure. Patient in agreement with these interventions.  Risks and benefits of image-guided Port-a-catheter placement were discussed with the patient including, but not limited to bleeding, infection, pneumothorax, or fibrin sheath development and need for additional procedures.  All of the patient's questions were answered, patient is agreeable to proceed.  Consent signed and in chart.  Thank you for this interesting consult.  I greatly enjoyed meeting [name] [name] and look forward to participating in their care.  A copy of this report was sent to the requesting provider on this date.  Electronically Signed: Joaquim Nam, PA-C 07/27/2021, 9:24 AM   I spent a total of  25 Minutes in face to face in clinical consultation, greater than 50% of which was counseling/coordinating care for port placement.

## 2021-07-27 NOTE — Progress Notes (Signed)
Patient clinically stable post  Port placement per Dr Denna Haggard, tolerated well. Received Versed 1 mg along with Fentanyl 50 mcg IV for procedure. Awake/alert and oriented post procedure.  Report given to [facility] Rn at bedside post procedure/specials.

## 2021-07-27 NOTE — Procedures (Signed)
Interventional Radiology Procedure Note  Date of Procedure: 07/27/2021  Procedure: Port placement   Findings:  1. Right chest port placement    Complications: No immediate complications noted.   Estimated Blood Loss: minimal  Follow-up and Recommendations: 1. Bedrest 1 hour   Albin Felling, MD  Vascular & Interventional Radiology  07/27/2021 12:31 PM  =

## 2021-07-28 ENCOUNTER — Encounter: Payer: Self-pay | Admitting: Oncology

## 2021-07-28 ENCOUNTER — Emergency Department
Admission: EM | Admit: 2021-07-28 | Discharge: 2021-07-28 | Disposition: A | Discharge status: Home / Self Care | Payer: Medicaid Other | Attending: Emergency Medicine | Admitting: Emergency Medicine

## 2021-07-28 ENCOUNTER — Emergency Department: Payer: Medicaid Other

## 2021-07-28 ENCOUNTER — Inpatient Hospital Stay: Payer: Medicaid Other | Attending: Oncology

## 2021-07-28 ENCOUNTER — Inpatient Hospital Stay: Payer: Medicaid Other

## 2021-07-28 ENCOUNTER — Other Ambulatory Visit: Payer: Self-pay

## 2021-07-28 ENCOUNTER — Inpatient Hospital Stay (HOSPITAL_BASED_OUTPATIENT_CLINIC_OR_DEPARTMENT_OTHER): Payer: Medicaid Other | Admitting: Oncology

## 2021-07-28 ENCOUNTER — Telehealth: Payer: Self-pay | Admitting: *Deleted

## 2021-07-28 VITALS — BP 96/62 | HR 91 | Temp 96.4°F | Resp 18 | Wt 112.0 lb

## 2021-07-28 VITALS — BP 109/58 | HR 96 | Resp 17

## 2021-07-28 DIAGNOSIS — N1832 Chronic kidney disease, stage 3b: Secondary | ICD-10-CM | POA: Diagnosis not present

## 2021-07-28 DIAGNOSIS — Z89511 Acquired absence of right leg below knee: Secondary | ICD-10-CM | POA: Insufficient documentation

## 2021-07-28 DIAGNOSIS — I10 Essential (primary) hypertension: Secondary | ICD-10-CM | POA: Insufficient documentation

## 2021-07-28 DIAGNOSIS — R7989 Other specified abnormal findings of blood chemistry: Secondary | ICD-10-CM | POA: Diagnosis not present

## 2021-07-28 DIAGNOSIS — Z86718 Personal history of other venous thrombosis and embolism: Secondary | ICD-10-CM | POA: Diagnosis not present

## 2021-07-28 DIAGNOSIS — E1122 Type 2 diabetes mellitus with diabetic chronic kidney disease: Secondary | ICD-10-CM | POA: Insufficient documentation

## 2021-07-28 DIAGNOSIS — Z888 Allergy status to other drugs, medicaments and biological substances status: Secondary | ICD-10-CM | POA: Insufficient documentation

## 2021-07-28 DIAGNOSIS — C3432 Malignant neoplasm of lower lobe, left bronchus or lung: Secondary | ICD-10-CM | POA: Diagnosis not present

## 2021-07-28 DIAGNOSIS — F32A Depression, unspecified: Secondary | ICD-10-CM | POA: Insufficient documentation

## 2021-07-28 DIAGNOSIS — Z902 Acquired absence of lung [part of]: Secondary | ICD-10-CM | POA: Diagnosis not present

## 2021-07-28 DIAGNOSIS — R110 Nausea: Secondary | ICD-10-CM | POA: Insufficient documentation

## 2021-07-28 DIAGNOSIS — Z8249 Family history of ischemic heart disease and other diseases of the circulatory system: Secondary | ICD-10-CM | POA: Insufficient documentation

## 2021-07-28 DIAGNOSIS — I252 Old myocardial infarction: Secondary | ICD-10-CM | POA: Diagnosis not present

## 2021-07-28 DIAGNOSIS — R945 Abnormal results of liver function studies: Secondary | ICD-10-CM | POA: Diagnosis not present

## 2021-07-28 DIAGNOSIS — D539 Nutritional anemia, unspecified: Secondary | ICD-10-CM

## 2021-07-28 DIAGNOSIS — Z85118 Personal history of other malignant neoplasm of bronchus and lung: Secondary | ICD-10-CM | POA: Diagnosis not present

## 2021-07-28 DIAGNOSIS — Z20822 Contact with and (suspected) exposure to covid-19: Secondary | ICD-10-CM | POA: Insufficient documentation

## 2021-07-28 DIAGNOSIS — R7401 Elevation of levels of liver transaminase levels: Secondary | ICD-10-CM | POA: Insufficient documentation

## 2021-07-28 DIAGNOSIS — T451X5A Adverse effect of antineoplastic and immunosuppressive drugs, initial encounter: Secondary | ICD-10-CM | POA: Diagnosis not present

## 2021-07-28 DIAGNOSIS — Z79899 Other long term (current) drug therapy: Secondary | ICD-10-CM | POA: Insufficient documentation

## 2021-07-28 DIAGNOSIS — I2510 Atherosclerotic heart disease of native coronary artery without angina pectoris: Secondary | ICD-10-CM | POA: Insufficient documentation

## 2021-07-28 DIAGNOSIS — C3492 Malignant neoplasm of unspecified part of left bronchus or lung: Secondary | ICD-10-CM

## 2021-07-28 DIAGNOSIS — R3129 Other microscopic hematuria: Secondary | ICD-10-CM | POA: Diagnosis not present

## 2021-07-28 DIAGNOSIS — Z87440 Personal history of urinary (tract) infections: Secondary | ICD-10-CM | POA: Diagnosis not present

## 2021-07-28 DIAGNOSIS — I509 Heart failure, unspecified: Secondary | ICD-10-CM | POA: Insufficient documentation

## 2021-07-28 DIAGNOSIS — E785 Hyperlipidemia, unspecified: Secondary | ICD-10-CM | POA: Insufficient documentation

## 2021-07-28 DIAGNOSIS — Z87891 Personal history of nicotine dependence: Secondary | ICD-10-CM | POA: Insufficient documentation

## 2021-07-28 DIAGNOSIS — N179 Acute kidney failure, unspecified: Secondary | ICD-10-CM | POA: Insufficient documentation

## 2021-07-28 DIAGNOSIS — I130 Hypertensive heart and chronic kidney disease with heart failure and stage 1 through stage 4 chronic kidney disease, or unspecified chronic kidney disease: Secondary | ICD-10-CM | POA: Insufficient documentation

## 2021-07-28 DIAGNOSIS — Z9049 Acquired absence of other specified parts of digestive tract: Secondary | ICD-10-CM | POA: Diagnosis not present

## 2021-07-28 DIAGNOSIS — Z86711 Personal history of pulmonary embolism: Secondary | ICD-10-CM | POA: Diagnosis not present

## 2021-07-28 DIAGNOSIS — F17290 Nicotine dependence, other tobacco product, uncomplicated: Secondary | ICD-10-CM | POA: Diagnosis not present

## 2021-07-28 DIAGNOSIS — Z833 Family history of diabetes mellitus: Secondary | ICD-10-CM | POA: Insufficient documentation

## 2021-07-28 DIAGNOSIS — Z5112 Encounter for antineoplastic immunotherapy: Secondary | ICD-10-CM

## 2021-07-28 DIAGNOSIS — E119 Type 2 diabetes mellitus without complications: Secondary | ICD-10-CM | POA: Diagnosis not present

## 2021-07-28 LAB — CBC WITH DIFFERENTIAL/PLATELET
Abs Immature Granulocytes: 0.01 10*3/uL (ref 0.00–0.07)
Basophils Absolute: 0 10*3/uL (ref 0.0–0.1)
Basophils Relative: 0 %
Eosinophils Absolute: 0.1 10*3/uL (ref 0.0–0.5)
Eosinophils Relative: 1 %
HCT: 27.9 % — ABNORMAL LOW (ref 36.0–46.0)
Hemoglobin: 9.1 g/dL — ABNORMAL LOW (ref 12.0–15.0)
Immature Granulocytes: 0 %
Lymphocytes Relative: 10 %
Lymphs Abs: 0.6 10*3/uL — ABNORMAL LOW (ref 0.7–4.0)
MCH: 32.2 pg (ref 26.0–34.0)
MCHC: 32.6 g/dL (ref 30.0–36.0)
MCV: 98.6 fL (ref 80.0–100.0)
Monocytes Absolute: 0.7 10*3/uL (ref 0.1–1.0)
Monocytes Relative: 12 %
Neutro Abs: 4.3 10*3/uL (ref 1.7–7.7)
Neutrophils Relative %: 77 %
Platelets: 273 10*3/uL (ref 150–400)
RBC: 2.83 MIL/uL — ABNORMAL LOW (ref 3.87–5.11)
RDW: 14.1 % (ref 11.5–15.5)
WBC: 5.6 10*3/uL (ref 4.0–10.5)
nRBC: 0 % (ref 0.0–0.2)

## 2021-07-28 LAB — COMPREHENSIVE METABOLIC PANEL
ALT: 707 U/L — ABNORMAL HIGH (ref 0–44)
AST: 987 U/L — ABNORMAL HIGH (ref 15–41)
Albumin: 3 g/dL — ABNORMAL LOW (ref 3.5–5.0)
Alkaline Phosphatase: 601 U/L — ABNORMAL HIGH (ref 38–126)
Anion gap: 11 (ref 5–15)
BUN: 52 mg/dL — ABNORMAL HIGH (ref 8–23)
CO2: 21 mmol/L — ABNORMAL LOW (ref 22–32)
Calcium: 8.4 mg/dL — ABNORMAL LOW (ref 8.9–10.3)
Chloride: 100 mmol/L (ref 98–111)
Creatinine, Ser: 2.13 mg/dL — ABNORMAL HIGH (ref 0.44–1.00)
GFR, Estimated: 26 mL/min — ABNORMAL LOW (ref >60)
Glucose, Bld: 175 mg/dL — ABNORMAL HIGH (ref 70–99)
Potassium: 3.9 mmol/L (ref 3.5–5.1)
Sodium: 132 mmol/L — ABNORMAL LOW (ref 135–145)
Total Bilirubin: 1.4 mg/dL — ABNORMAL HIGH (ref 0.3–1.2)
Total Protein: 6.5 g/dL (ref 6.5–8.1)

## 2021-07-28 LAB — LIPASE, BLOOD: Lipase: 62 U/L — ABNORMAL HIGH (ref 11–51)

## 2021-07-28 LAB — HEPATITIS PANEL, ACUTE
HCV Ab: NONREACTIVE
Hep A IgM: NONREACTIVE
Hep B C IgM: NONREACTIVE
Hepatitis B Surface Ag: NONREACTIVE

## 2021-07-28 LAB — RESP PANEL BY RT-PCR (FLU A&B, COVID) ARPGX2
Influenza A by PCR: NEGATIVE
Influenza B by PCR: NEGATIVE
SARS Coronavirus 2 by RT PCR: NEGATIVE

## 2021-07-28 LAB — PROTIME-INR
INR: 1 (ref 0.8–1.2)
Prothrombin Time: 12.7 seconds (ref 11.4–15.2)

## 2021-07-28 LAB — T4, FREE: Free T4: 1.37 ng/dL — ABNORMAL HIGH (ref 0.61–1.12)

## 2021-07-28 LAB — ACETAMINOPHEN LEVEL: Acetaminophen (Tylenol), Serum: <10 ug/mL — ABNORMAL LOW (ref 10–30)

## 2021-07-28 LAB — VITAMIN B12: Vitamin B-12: 1638 pg/mL — ABNORMAL HIGH (ref 180–914)

## 2021-07-28 LAB — TSH: TSH: 3.647 u[IU]/mL (ref 0.350–4.500)

## 2021-07-28 MED ORDER — ONDANSETRON HCL 4 MG/2ML IJ SOLN
4.0000 mg | Freq: Once | INTRAMUSCULAR | Status: AC
  Administered 2021-07-28: 4 mg via INTRAVENOUS
  Filled 2021-07-28: qty 2

## 2021-07-28 MED ORDER — LIDOCAINE-PRILOCAINE 2.5-2.5 % EX CREA: 1.0000 "application " | TOPICAL_CREAM | CUTANEOUS | 1 refills | Status: DC | PRN

## 2021-07-28 MED ORDER — ONDANSETRON HCL 8 MG PO TABS: 8.0000 mg | ORAL_TABLET | Freq: Two times a day (BID) | ORAL | 1 refills | Status: DC | PRN

## 2021-07-28 MED ORDER — HEPARIN SOD (PORK) LOCK FLUSH 100 UNIT/ML IV SOLN
500.0000 [IU] | Freq: Once | INTRAVENOUS | Status: AC
  Administered 2021-07-28: 500 [IU] via INTRAVENOUS
  Filled 2021-07-28: qty 5

## 2021-07-28 MED ORDER — LACTATED RINGERS IV BOLUS
1000.0000 mL | Freq: Once | INTRAVENOUS | Status: AC
  Administered 2021-07-28: 1000 mL via INTRAVENOUS

## 2021-07-28 MED ORDER — DRONABINOL 2.5 MG PO CAPS: 2.5000 mg | ORAL_CAPSULE | Freq: Two times a day (BID) | ORAL | 0 refills | Status: DC

## 2021-07-28 MED ORDER — SODIUM CHLORIDE 0.9 % IV SOLN
Freq: Once | INTRAVENOUS | Status: AC
  Filled 2021-07-28: qty 250

## 2021-07-28 NOTE — ED Provider Notes (Signed)
[facility] Provider Note    Event Date/Time   First MD Initiated Contact with Patient 07/28/21 1409     (approximate)   History   Chief Complaint Abnormal Lab   HPI [name] [name] is a 66 y.o. female, history of CAD, type 2 diabetes, hypertension, hyperlipidemia, non-small cell lung cancer, presents to the emergency department for evaluation of abnormal lab.  Patient states that she was recently seen at the cancer center this morning and was told that she had elevated LFTs.  She was unable to complete her chemotherapy treatment today.  She was sent here by Dr. [name] for evaluation.  The patient states that she is feeling a little nauseous, but otherwise feels fine.  Denies fever/chills, chest pain, shortness of breath, abdominal pain, back pain, diarrhea, urinary symptoms, headache, or rashes  History Limitations: No limitations.      Physical Exam  Triage Vital Signs: ED Triage Vitals  Enc Vitals Group     BP 07/28/21 1219 (!) 92/58     Pulse Rate 07/28/21 1219 91     Resp 07/28/21 1219 16     Temp 07/28/21 1219 98.1 F (36.7 C)     Temp Source 07/28/21 1219 Oral     SpO2 07/28/21 1219 100 %     Weight 07/28/21 1218 111 lb 15.9 oz (50.8 kg)     Height 07/28/21 1218 5\' 5"  (1.651 m)     Head Circumference --      Peak Flow --      Pain Score 07/28/21 1218 0     Pain Loc --      Pain Edu? --      Excl. in [place]? --     Most recent vital signs: Vitals:   07/28/21 1219 07/28/21 1546  BP: (!) 92/58 116/72  Pulse: 91 81  Resp: 16 18  Temp: 98.1 F (36.7 C)   SpO2: 100% 98%    General: Awake, NAD.  CV: Good peripheral perfusion.  Resp: Normal effort.  Lung sounds clear bilaterally. Abd: Soft, non-tender. No distention.  Negative Murphy sign. Neuro: At baseline. No gross neurological deficits. Other: Normal skin tone and color.   Physical Exam    ED Results / Procedures / Treatments  Labs (all labs ordered are listed, but only abnormal  results are displayed) Labs Reviewed  LIPASE, BLOOD - Abnormal; Notable for the following components:      Result Value   Lipase 62 (*)    All other components within normal limits  ACETAMINOPHEN LEVEL - Abnormal; Notable for the following components:   Acetaminophen (Tylenol), Serum <10 (*)    All other components within normal limits  RESP PANEL BY RT-PCR (FLU A&B, COVID) [id number]  PROTIME-INR     EKG Sinus rhythm, rate of 81, no ST segment changes, no axis deviations, no AV blocks.   RADIOLOGY  ED Provider Interpretation: I personally reviewed and interpreted this image.  No evidence of biliary duct distention.  No acute findings on liver.  US ABDOMEN LIMITED RUQ (LIVER/GB)  Result Date: 07/28/2021 CLINICAL DATA:  Elevated liver enzymes. EXAM: ULTRASOUND ABDOMEN LIMITED RIGHT UPPER QUADRANT COMPARISON:  November 17, 2020. FINDINGS: Gallbladder: Patient is post cholecystectomy as evidenced on prior CT imaging. Common bile duct: Diameter: 5.4 mm Liver: No focal lesion identified. Within normal limits in parenchymal echogenicity. Portal vein is patent on color Doppler imaging with normal direction of blood flow towards the liver. Other: None. IMPRESSION: Post cholecystectomy without  signs of biliary duct distension. No parenchymal abnormalities noted in the liver. Electronically Signed   By: Zetta Bills M.D.   On: 07/28/2021 15:13    PROCEDURES:  Critical Care performed: None.  Procedures    MEDICATIONS ORDERED IN ED: Medications  lactated ringers bolus 1,000 mL (1,000 mLs Intravenous New Bag/Given 07/28/21 1512)  ondansetron (ZOFRAN) injection 4 mg (4 mg Intravenous Given 07/28/21 1512)     IMPRESSION / MDM / ASSESSMENT AND PLAN / ED COURSE  I reviewed the triage vital signs and the nursing notes.                              [name] [name] is a 66 y.o. female, history of CAD, type 2 diabetes, hypertension, hyperlipidemia, non-small cell lung cancer, presents to the emergency  department for evaluation of abnormal lab.  Patient states that she was recently seen at the cancer center this morning and was told that she had elevated LFTs.  She was unable to complete her chemotherapy treatment today.  She was sent here by Dr. [name] for evaluation.  The patient states that she is feeling a little nauseous, but otherwise feels fine.    Differential diagnosis includes, but is not limited to, Cholelithiasis, cholecystitis, hepatitis, cirrhosis, acetaminophen overdose, right sided HF   ED Course Patient appears well.  NAD.  Vital signs within normal limits.  External labs notable for significant elevation in LFTs.  AST 987, ALT 707, alkaline phosphatase 601.  Elevated bilirubin at 1.4.  CMP notable for elevated creatinine at 2.13 from 1.39 approximately 3 weeks ago.  Evidence of AKI.  We will go ahead and initiate IV fluids.  Acetaminophen level unremarkable, less than 10.   Lipase unremarkable at 62.  Unlikely pancreatitis  INR 1.0.  Prothrombin time 12.7  Respiratory panel negative for COVID-19 or influenza.    Assessment/Plan Given the patient's history, physical exam, and work-up thus far, I do not suspect any serious or life-threatening pathology.  I suspect transaminitis is likely related to patient's immunotherapy.  Consulted with on-call oncologist, Dr. Grayland Ormond, who agrees and states that the patient is safe for discharge.  We will plan to discharge this patient with oncology follow-up  Patient was provided with anticipatory guidance, return precautions, and educational material. Encouraged the patient to return to the emergency department at any time if they begin to experience any new or worsening symptoms.       FINAL CLINICAL IMPRESSION(S) / ED DIAGNOSES   Final diagnoses:  Transaminitis     Rx / DC Orders   ED Discharge Orders     None        Note:  This document was prepared using Dragon voice recognition software and may include  unintentional dictation errors.   Teodoro Spray, [place] 07/28/21 2047    Lucrezia Starch, MD 07/29/21 1444

## 2021-07-28 NOTE — Progress Notes (Signed)
Nutrition Follow-up:  Patient with stage II lung cancer.  Patient receiving atezolizumab.    Met with patient during infusion.  Reports that her appetite is poor, especially over the last few days.  Just ate egg sandwich yesterday. Day before that 2 hot dogs, no bread.  This am drank a glucerna shake and ate peanut butter crackers.  Says that she has nausea almost every day and takes compazine but does not seem to help as much as zofran.  She is out of zofran and forgot to tell MD today.  Denies constipation or diarrhea    Medications: marinol being added today  Labs: Na 132, BUN 52, creatinine 2.13  Anthropometrics:   Weight 112 lb  118 lb 4.8 oz on 1/19 114 lb on 12/01 115 lb on 11/21 113 lb on 10/25 116 lb on 10/4   NUTRITION DIAGNOSIS: Inadequate oral intake continues    INTERVENTION:  Sent message to MD and team and zofran will be refilled.  Informed patient Patient aware of marinol being added today for appetite. She will start once it is refilled Discussed ways to add calories and protein     MONITORING, EVALUATION, GOAL: weight trends, intake   NEXT VISIT: to be determined  [name] [name], [place], [place] Registered Dietitian [phone] (mobile)

## 2021-07-28 NOTE — Telephone Encounter (Signed)
[facility]anier Clam KeyElla Bodo - Rx #: [id number]  PA submitted via covermymeds-  This request has received a Favorable outcome.  Please note any additional information provided by [facility] at the bottom of this request.

## 2021-07-28 NOTE — ED Triage Notes (Signed)
Pt comes into the ED via Cancer center due to patient having abnormal labs with elevated LFTs.  Pt unable to complete her chemo treatment today.  Pt sent by Dr. Jeani Sow for evaluation.  Pt denies any abdominal pain.  Pt in NAD at this time with even and unlabored respirations.

## 2021-07-28 NOTE — Telephone Encounter (Signed)
I left a vm for [facility] [place] explaining that the medication has been approved.

## 2021-07-28 NOTE — Progress Notes (Signed)
Patient here for follow up. Reports decreased appetiite. She is scheduled with dietitian today.

## 2021-07-28 NOTE — Discharge Instructions (Addendum)
-  Follow-up with your oncologist, as discussed -Return to the emergency department anytime if you begin to experience any new or worsening symptoms

## 2021-07-28 NOTE — Progress Notes (Signed)
Hematology/Oncology progress note [phone] [phone]   Patient Care Team: [name], MD as PCP - General (Family Medicine) Earlie Server, MD as Consulting Physician (Hematology and Oncology)  REFERRING PROVIDER: [name], MD  CHIEF COMPLAINTS/REASON FOR VISIT:  Follow up for stage IIIA lung cancer  HISTORY OF PRESENTING ILLNESS:  Patient was recently seen by urology Dr. Candiss Norse ski for evaluation of microscopic hematuria, frequent UTI. 11/17/2020, CT hematuria work-up was obtained which showed no evidence of urinary tract calculus or hydronephrosis.  Small left renal cyst. Incidental findings of 1.9 x 1.2 cm left lower lobe pulmonary nodule worrisome for malignancy. Patient was referred to establish care with oncology for further evaluation  .  Patient denies any shortness of breath, cough, hemoptysis, unintentional weight loss, fever or night sweats. She currently smokes 1 cigar/day.  She started smoking cigarettes at age of 23.  She quitted at age of 45 and restarted smoking cigar a few years ago.  Patient has a history of depression and is on Cymbalta.  Diabetes, hyperlipidemia, hypertension, peripheral artery disease Per patient she also has history of heart attack in 2011. History of right lower extremity BKA in 2021, 02/15/2020 history of pulmonary embolism involving segmental and subsegmental branches of right upper, middle, lower lobe pulmonary arteries.  She was put on on apixaban. Patient lives with her aunt.  12/07/2020, PET scan showed left lower lobe 1.3 x 1.8 cm lung nodule with SUV of 6.5.  Suspect early stage primary lung neoplasm.  No hilar or mediastinal lymphadenopathy activity.  # 12/23/2020 S/p CT guided biopsy of left lower lobe mass.  Pathology showed non-small cell carcinoma, favor adenocarcinoma.  Positive for TTF-1, negative for p40.     # 01/17/2021, patient underwent left lower lobectomy with lymph node dissection. Pathology  showed invasive poorly differentiated adenocarcinoma, 2.2 cm.  Tumor abuts pleura but visceral pleural surface is not involved by carcinoma.  LVI invasion is present, resection margins are negative for carcinoma.  10 lymph nodes were harvested and 3 lymph nodes were involved with carcinoma. pT1c pN2 Patient's case was discussed at [facility] oncology tumor board.  And consensus recommendation was adjuvant chemotherapy and sent tissue for molecular/PD-L1 testing.  Foundation one testing showed PD-L1 1,  No reportable alterations ERBB2 S310F  # 02/28/2021 adjuvant carboplatin and Taxol  INTERVAL HISTORY [name] [name] is a 66 y.o. female who has above history reviewed by me today presents for follow up visit for Stage IIIA.  BP is low today. She reports intermittent nausea. No vomiting.  No new complaints. Denies abdominal pain.   07/29/2021 Medi port placed by IR.   Review of Systems  Constitutional:  Negative for appetite change, chills, fatigue and fever.  HENT:   Negative for hearing loss and voice change.   Eyes:  Negative for eye problems.  Respiratory:  Negative for chest tightness and cough.   Cardiovascular:  Negative for chest pain.  Gastrointestinal:  Negative for abdominal distention, abdominal pain, blood in stool, diarrhea and nausea.  Endocrine: Negative for hot flashes.  Genitourinary:  Negative for difficulty urinating, dysuria and frequency.   Musculoskeletal:  Negative for arthralgias.  Skin:  Negative for itching and rash.  Neurological:  Negative for extremity weakness.  Hematological:  Negative for adenopathy.  Psychiatric/Behavioral:  Negative for confusion.    MEDICAL HISTORY:  Past Medical History:  Diagnosis Date   Anemia    Cancer ([place])    CHF (congestive heart failure) ([place])  02/16/20 HFrEF 20-25% in setting of acute DVT/PE, underlying CAD   CKD (chronic kidney disease)    Coronary artery disease    02/20/20: CTO pRCA with left-to-right collaterals, 50%  mLAD, 80% OM1, medical therapy   Depression    Diabetes ([place])    Diabetes mellitus without complication ([place])    DVT (deep venous thrombosis) ([place]) 02/16/2020   RLE DVT proximal veins 02/16/20 Duplex   Hyperlipidemia    Hypertension    Myocardial infarction [facility]) 2009   Stress related per patient; NSTEMI 2012 100% RCA with left-to-right collaterals   PE (pulmonary thromboembolism) ([place]) 02/15/2020   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, RLE BKA   Peripheral artery disease ([place])     SURGICAL HISTORY: Past Surgical History:  Procedure Laterality Date   IR IMAGING GUIDED PORT INSERTION  07/27/2021   Right lower extremity BKA Right     SOCIAL HISTORY: Social History   Socioeconomic History   Marital status: Married    Spouse name: Not on file   Number of children: Not on file   Years of education: Not on file   Highest education level: Not on file  Occupational History   Not on file  Tobacco Use   Smoking status: Former    Packs/day: 0.25    Types: Cigars, Cigarettes    Quit date: 01/17/2021    Years since quitting: 0.5   Smokeless tobacco: Never  Vaping Use   Vaping Use: Never used  Substance and Sexual Activity   Alcohol use: Never   Drug use: Not Currently   Sexual activity: Not Currently  Other Topics Concern   Not on file  Social History Narrative   Not on file   Social Determinants of Health   Financial Resource Strain: Not on file  Food Insecurity: Not on file  Transportation Needs: Not on file  Physical Activity: Not on file  Stress: Not on file  Social Connections: Not on file  Intimate Partner Violence: Not on file    FAMILY HISTORY: Family History  Problem Relation Age of Onset   Heart disease Mother    Diabetes Mother    Heart disease Father    Diabetes Father     ALLERGIES:  is allergic to lisinopril.  MEDICATIONS:  Current Outpatient Medications  Medication Sig Dispense Refill   ACCU-CHEK GUIDE test strip USE UP TP 4 TIMES  A DAY AS DIRECTED 100 strip 1   Accu-Chek Softclix Lancets lancets SMARTSIG:Topical 1-4 Times Daily     aspirin EC 81 MG tablet Take 81 mg by mouth in the morning. Swallow whole.     atorvastatin (LIPITOR) 80 MG tablet Take 1 tablet (80 mg total) by mouth daily. 90 tablet 1   blood glucose meter kit and supplies KIT Dispense based on patient and insurance preference. Use up to four times daily as directed. 1 each 0   carboxymethylcellulose (REFRESH PLUS) 0.5 % SOLN Place 1 drop into both eyes in the morning and at bedtime.     diphenhydrAMINE (BENADRYL) 25 MG tablet Take 25 mg by mouth at bedtime.     docusate sodium (COLACE) 100 MG capsule Take 1 capsule (100 mg total) by mouth 2 (two) times daily. 60 capsule 1   dronabinol (MARINOL) 2.5 MG capsule Take 1 capsule (2.5 mg total) by mouth 2 (two) times daily before a meal. 60 capsule 0   DULoxetine (CYMBALTA) 20 MG capsule TAKE 2 CAPSULES (40 MG TOTAL) BY MOUTH IN THE  MORNING AND AT BEDTIME. 180 capsule 2   gabapentin (NEURONTIN) 100 MG capsule Take 1 capsule (100 mg total) by mouth 2 (two) times daily. 60 capsule 5   HUMALOG KWIKPEN 100 UNIT/ML KwikPen Inject 8 Units into the skin 3 (three) times daily after meals.     hydrALAZINE (APRESOLINE) 100 MG tablet Take 1 tablet (100 mg total) by mouth 3 (three) times daily. 270 tablet 0   isosorbide dinitrate (ISORDIL) 30 MG tablet TAKE 1 TABLET (30 MG TOTAL) BY MOUTH IN THE MORNING, AT NOON, AND AT BEDTIME. 90 tablet 0   JARDIANCE 10 MG TABS tablet TAKE 1 TABLET BY MOUTH EVERY DAY 90 tablet 0   ketoconazole (NIZORAL) 2 % cream APPLY 1 APPLICATION TOPICALLY IN THE MORNING AND AT BEDTIME. APPLIED TO FEET 60 g 1   LANTUS SOLOSTAR 100 UNIT/ML Solostar Pen 16 Units at bedtime.     lidocaine-prilocaine (EMLA) cream Apply 1 application topically as needed. Apply to port and cover with saran wrap 1-2 hours prior to port access 30 g 1   metFORMIN (GLUCOPHAGE-XR) 500 MG 24 hr tablet TAKE 1 TABLET BY MOUTH TWICE A  DAY 180 tablet 2   metoprolol succinate (TOPROL-XL) 50 MG 24 hr tablet Take 1 tablet (50 mg total) by mouth daily. Take with or immediately following a meal. 90 tablet 1   Multiple Vitamin (MULTIVITAMIN WITH MINERALS) TABS tablet Take 1 tablet by mouth in the morning. Adults 50+     potassium chloride (KLOR-CON) 10 MEQ tablet Take 1 tablet (10 mEq total) by mouth daily. 14 tablet 0   prochlorperazine (COMPAZINE) 10 MG tablet Take 1 tablet (10 mg total) by mouth every 6 (six) hours as needed (Nausea or vomiting). 30 tablet 1   spironolactone (ALDACTONE) 25 MG tablet TAKE 1 TABLET (25 MG TOTAL) BY MOUTH DAILY. 90 tablet 0   ondansetron (ZOFRAN) 8 MG tablet Take 1 tablet (8 mg total) by mouth 2 (two) times daily as needed (Nausea or vomiting). Start if needed on the third day after cisplatin. 30 tablet 1   vitamin B-12 (CYANOCOBALAMIN) 1000 MCG tablet Take 1 tablet (1,000 mcg total) by mouth daily. (Patient not taking: Reported on 07/28/2021) 30 tablet 2   No current facility-administered medications for this visit.     PHYSICAL EXAMINATION: ECOG PERFORMANCE STATUS: 1 - Symptomatic but completely ambulatory Vitals:   07/28/21 0924  BP: 96/62  Pulse: 91  Resp: 18  Temp: (!) 96.4 F (35.8 C)  SpO2: 100%   Filed Weights   07/28/21 0924  Weight: 112 lb (50.8 kg)    Physical Exam Constitutional:      General: She is not in acute distress.    Appearance: She is not ill-appearing.  HENT:     Head: Normocephalic and atraumatic.  Eyes:     General: No scleral icterus. Cardiovascular:     Rate and Rhythm: Normal rate and regular rhythm.     Heart sounds: Normal heart sounds.  Pulmonary:     Effort: Pulmonary effort is normal. No respiratory distress.     Breath sounds: No wheezing.     Comments: Absent breath sounds left basilar Abdominal:     General: Bowel sounds are normal. There is no distension.     Palpations: Abdomen is soft.  Musculoskeletal:        General: No deformity.  Normal range of motion.     Cervical back: Normal range of motion and neck supple.     Comments: Right  lower extremity BKA  Skin:    General: Skin is warm and dry.     Findings: No erythema or rash.  Neurological:     Mental Status: She is alert and oriented to person, place, and time. Mental status is at baseline.     Cranial Nerves: No cranial nerve deficit.     Coordination: Coordination normal.  Psychiatric:        Mood and Affect: Mood normal.    LABORATORY DATA:  I have reviewed the data as listed Lab Results  Component Value Date   WBC 5.6 07/28/2021   HGB 9.1 (L) 07/28/2021   HCT 27.9 (L) 07/28/2021   MCV 98.6 07/28/2021   PLT 273 07/28/2021   Recent Labs    08/10/20 1658 11/17/20 1149 06/23/21 0811 07/07/21 0919 07/28/21 0855  NA 136   < > 139 137 132*  K 4.6   < > 3.6 4.3 3.9  CL 101   < > 110 108 100  CO2 17*   < > 24 24 21*  GLUCOSE 485*   < > 120* 118* 175*  BUN 39*   < > 24* 20 52*  CREATININE 1.18*   < > 1.28* 1.39* 2.13*  CALCIUM 9.2   < > 8.6* 8.6* 8.4*  GFRNONAA 50*   < > 47* 43* 26*  GFRAA 57*  --   --   --   --   PROT  --    < > 6.1* 5.9* 6.5  ALBUMIN 3.2*   < > 3.0* 3.3* 3.0*  AST  --    < > 20 27 987*  ALT  --    < > 13 18 707*  ALKPHOS  --    < > 107 114 601*  BILITOT  --    < > 0.3 0.5 1.4*   < > = values in this interval not displayed.    Iron/TIBC/Ferritin/ %Sat    Component Value Date/Time   IRON 44 12/30/2020 1437   TIBC 347 12/30/2020 1437   FERRITIN 15 12/30/2020 1437   IRONPCTSAT 13 12/30/2020 1437      RADIOGRAPHIC STUDIES: I have personally reviewed the radiological images as listed and agreed with the findings in the report. IR IMAGING GUIDED PORT INSERTION  Result Date: 07/27/2021 INDICATION: History of lung cancer EXAM: Ultrasound-guided puncture of the right internal jugular vein Placement of a right-sided chest port using fluoroscopic guidance MEDICATIONS: None ANESTHESIA/SEDATION: Moderate (conscious) sedation was  employed during this procedure. A total of Versed 1 mg and Fentanyl 50 mcg was administered intravenously. Moderate Sedation Time: 20 minutes. The patient's level of consciousness and vital signs were monitored continuously by radiology nursing throughout the procedure under my direct supervision. FLUOROSCOPY TIME:  Fluoroscopy Time: 0.3 minutes (2.5 mGy) COMPLICATIONS: None immediate. PROCEDURE: Informed written consent was obtained from the patient after a thorough discussion of the procedural risks, benefits and alternatives. All questions were addressed. Maximal Sterile Barrier Technique was utilized including caps, mask, sterile gowns, sterile gloves, sterile drape, hand hygiene and skin antiseptic. A timeout was performed prior to the initiation of the procedure. The patient was placed supine on the exam table. The right neck and chest was prepped and draped in the standard sterile fashion. A preliminary ultrasound of the right neck was performed and demonstrates a patent right internal jugular vein. A permanent ultrasound image was stored in the electronic medical record. The overlying skin was anesthetized with 1% Lidocaine. Using ultrasound guidance, access was obtained  into the right internal jugular vein using a 21 gauge micropuncture set. A wire was advanced into the SVC, a short incision was made at the puncture site, and serial dilatation performed. Next, in an ipsilateral infraclavicular location, an incision was made at the site of the subcutaneous reservoir. Blunt dissection was used to open a pocket to contain the reservoir. A subcutaneous tunnel was then created from the port site to the puncture site. A(n) 8 Fr single lumen catheter was advanced through the tunnel. The catheter was attached to the port and this was placed in the subcutaneous pocket. Under fluoroscopic guidance, a peel away sheath was placed, and the catheter was trimmed to the appropriate length and was advanced into the central  veins. The catheter length is 22 cm. The tip of the catheter lies near the superior cavoatrial junction. The port flushes and aspirates appropriately. The port was flushed and locked with heparinized saline. The port pocket was closed in 2 layers using 3-0 and 4-0 Vicryl/absorbable suture. Dermabond was also applied to both incisions. The patient tolerated the procedure well and was transferred to recovery in stable condition. IMPRESSION: Successful placement of a right chest port via the right internal jugular vein. The port is ready for immediate use. Electronically Signed   By: Albin Felling M.D.   On: 07/27/2021 12:30      ASSESSMENT & PLAN:  1. Non-small cell cancer of left lung ([place])   2. Encounter for antineoplastic immunotherapy   3. Stage 3b chronic kidney disease ([place])   4. Macrocytic anemia   5. Chemotherapy-induced nausea   6. Elevated LFTs   7. AKI (acute kidney injury) ([place])     Cancer Staging  Non-small cell lung cancer [facility]) Staging form: Lung, AJCC 8th Edition - Pathologic stage from 01/28/2021: Stage IIIA (pT1c, pN2, cM0) - Signed by Earlie Server, MD on 02/17/2021  #Stage IIIA left lung non-small cell lung cancer-.  Poorly differentiated adenocarcinoma.  Status post left lower lobectomy and lymph node dissection pT1c pN2 [pathology addendum changed to N2]  She has negative surgical margin, no clear benefit of postoperative RT.  S/p 4 cycles of adjuvant chemotherapy Carboplatin and Taxol.  S/p 1 cycle of Tecentriq.  Labs are reviewed and discussed with patient. CMP resulted after she left my clinic room. Hold Tecentriq.  Acute LFT elevation, this could be due to immunotherapy, need to rule out other etiologies. Check hepatitis panel.  STAT ultrasound imaging- can not be done right away as she just ate.  Will send patient to ER for further evaluation.   # Hypotension, BP improves after 1L of IVF.   #AKI on CKD. Cr increased. IVF. ER evaluation.   #Depression, she is on  Duloxetine. She has major life stress currently I recommend her to see psychiatrist. Patient declined.    All questions were answered. The patient knows to call the clinic with any problems questions or concerns.  cc [name], MD   Return of visit:  TBD  Earlie Server, MD, PhD 07/28/2021

## 2021-07-28 NOTE — Telephone Encounter (Signed)
-----   Message from Secundino Ginger sent at 07/28/2021 11:40 AM EST ----- Regarding: PRIOR AUTHORIZATION- [place]- [facility] sent to chart for Dronabinol.

## 2021-07-28 NOTE — Progress Notes (Signed)
Labs reviewed with MD and treatment team. Per MD to hold treatment, draw a hepatitis panel, and send pt to the ER for further evaluation. Pt agrees to plan and all questions answered at this time. Port left accessed per MD and for hospital use. Vitals stable. Pt has no complaints at this time. Pt escorted to ER by RN via wheelchair without any complications. Pt stable.   Kaitland Lewellyn [employer]

## 2021-07-29 ENCOUNTER — Telehealth: Payer: Self-pay | Admitting: Oncology

## 2021-07-29 ENCOUNTER — Telehealth: Payer: Self-pay

## 2021-07-29 DIAGNOSIS — R7989 Other specified abnormal findings of blood chemistry: Secondary | ICD-10-CM

## 2021-07-29 DIAGNOSIS — C3492 Malignant neoplasm of unspecified part of left bronchus or lung: Secondary | ICD-10-CM

## 2021-07-29 NOTE — Telephone Encounter (Signed)
Patient has been discharged from ER. Please schedule her for lab (LFT)/ NP early - mid next week and inform pt of appt.

## 2021-07-29 NOTE — Telephone Encounter (Signed)
Called pt to inform her of appts. Left VM, encouraged to call back if need to reschedule or message on MyChart

## 2021-07-29 NOTE — Telephone Encounter (Signed)
Transition Care Management Unsuccessful Follow-up Telephone Call  Date of discharge and from where:  07/28/2021 from [facility]  Attempts:  1st Attempt  Reason for unsuccessful TCM follow-up call:  Left voice message

## 2021-08-02 ENCOUNTER — Other Ambulatory Visit: Payer: Self-pay

## 2021-08-02 ENCOUNTER — Inpatient Hospital Stay
Admission: EM | Admit: 2021-08-02 | Discharge: 2021-08-09 | DRG: 441 | Disposition: A | Discharge status: Home / Self Care | Payer: Medicaid Other | Attending: Internal Medicine | Admitting: Internal Medicine

## 2021-08-02 ENCOUNTER — Encounter: Payer: Self-pay | Admitting: Oncology

## 2021-08-02 ENCOUNTER — Inpatient Hospital Stay: Payer: Medicaid Other

## 2021-08-02 ENCOUNTER — Encounter: Payer: Self-pay | Admitting: Emergency Medicine

## 2021-08-02 ENCOUNTER — Ambulatory Visit: Payer: Medicaid Other | Admitting: Nurse Practitioner

## 2021-08-02 ENCOUNTER — Inpatient Hospital Stay (HOSPITAL_BASED_OUTPATIENT_CLINIC_OR_DEPARTMENT_OTHER): Payer: Medicaid Other | Admitting: Oncology

## 2021-08-02 ENCOUNTER — Emergency Department: Payer: Medicaid Other

## 2021-08-02 VITALS — BP 86/67 | HR 108 | Temp 96.2°F | Wt 110.0 lb

## 2021-08-02 DIAGNOSIS — F329 Major depressive disorder, single episode, unspecified: Secondary | ICD-10-CM | POA: Diagnosis not present

## 2021-08-02 DIAGNOSIS — D631 Anemia in chronic kidney disease: Secondary | ICD-10-CM | POA: Diagnosis present

## 2021-08-02 DIAGNOSIS — R5381 Other malaise: Secondary | ICD-10-CM

## 2021-08-02 DIAGNOSIS — N1832 Chronic kidney disease, stage 3b: Secondary | ICD-10-CM | POA: Diagnosis not present

## 2021-08-02 DIAGNOSIS — E1122 Type 2 diabetes mellitus with diabetic chronic kidney disease: Secondary | ICD-10-CM | POA: Diagnosis present

## 2021-08-02 DIAGNOSIS — Z681 Body mass index (BMI) 19 or less, adult: Secondary | ICD-10-CM

## 2021-08-02 DIAGNOSIS — N183 Chronic kidney disease, stage 3 unspecified: Secondary | ICD-10-CM

## 2021-08-02 DIAGNOSIS — Z20822 Contact with and (suspected) exposure to covid-19: Secondary | ICD-10-CM | POA: Diagnosis present

## 2021-08-02 DIAGNOSIS — D649 Anemia, unspecified: Secondary | ICD-10-CM | POA: Diagnosis not present

## 2021-08-02 DIAGNOSIS — C3490 Malignant neoplasm of unspecified part of unspecified bronchus or lung: Secondary | ICD-10-CM | POA: Diagnosis present

## 2021-08-02 DIAGNOSIS — I5022 Chronic systolic (congestive) heart failure: Secondary | ICD-10-CM | POA: Diagnosis not present

## 2021-08-02 DIAGNOSIS — E440 Moderate protein-calorie malnutrition: Secondary | ICD-10-CM | POA: Diagnosis present

## 2021-08-02 DIAGNOSIS — R7401 Elevation of levels of liver transaminase levels: Secondary | ICD-10-CM | POA: Diagnosis not present

## 2021-08-02 DIAGNOSIS — R531 Weakness: Secondary | ICD-10-CM

## 2021-08-02 DIAGNOSIS — C3492 Malignant neoplasm of unspecified part of left bronchus or lung: Secondary | ICD-10-CM

## 2021-08-02 DIAGNOSIS — I2510 Atherosclerotic heart disease of native coronary artery without angina pectoris: Secondary | ICD-10-CM | POA: Diagnosis not present

## 2021-08-02 DIAGNOSIS — N185 Chronic kidney disease, stage 5: Secondary | ICD-10-CM | POA: Diagnosis present

## 2021-08-02 DIAGNOSIS — C3432 Malignant neoplasm of lower lobe, left bronchus or lung: Secondary | ICD-10-CM | POA: Diagnosis not present

## 2021-08-02 DIAGNOSIS — Z79899 Other long term (current) drug therapy: Secondary | ICD-10-CM

## 2021-08-02 DIAGNOSIS — I130 Hypertensive heart and chronic kidney disease with heart failure and stage 1 through stage 4 chronic kidney disease, or unspecified chronic kidney disease: Secondary | ICD-10-CM | POA: Diagnosis not present

## 2021-08-02 DIAGNOSIS — F17201 Nicotine dependence, unspecified, in remission: Secondary | ICD-10-CM | POA: Diagnosis not present

## 2021-08-02 DIAGNOSIS — R778 Other specified abnormalities of plasma proteins: Secondary | ICD-10-CM | POA: Diagnosis present

## 2021-08-02 DIAGNOSIS — Z833 Family history of diabetes mellitus: Secondary | ICD-10-CM

## 2021-08-02 DIAGNOSIS — Z89511 Acquired absence of right leg below knee: Secondary | ICD-10-CM

## 2021-08-02 DIAGNOSIS — Z86718 Personal history of other venous thrombosis and embolism: Secondary | ICD-10-CM | POA: Diagnosis not present

## 2021-08-02 DIAGNOSIS — R54 Age-related physical debility: Secondary | ICD-10-CM | POA: Diagnosis not present

## 2021-08-02 DIAGNOSIS — R64 Cachexia: Secondary | ICD-10-CM | POA: Diagnosis present

## 2021-08-02 DIAGNOSIS — I5020 Unspecified systolic (congestive) heart failure: Secondary | ICD-10-CM | POA: Diagnosis not present

## 2021-08-02 DIAGNOSIS — F17200 Nicotine dependence, unspecified, uncomplicated: Secondary | ICD-10-CM | POA: Diagnosis present

## 2021-08-02 DIAGNOSIS — E119 Type 2 diabetes mellitus without complications: Secondary | ICD-10-CM

## 2021-08-02 DIAGNOSIS — T451X5A Adverse effect of antineoplastic and immunosuppressive drugs, initial encounter: Secondary | ICD-10-CM | POA: Diagnosis present

## 2021-08-02 DIAGNOSIS — I252 Old myocardial infarction: Secondary | ICD-10-CM

## 2021-08-02 DIAGNOSIS — N179 Acute kidney failure, unspecified: Secondary | ICD-10-CM | POA: Diagnosis present

## 2021-08-02 DIAGNOSIS — D638 Anemia in other chronic diseases classified elsewhere: Secondary | ICD-10-CM | POA: Diagnosis present

## 2021-08-02 DIAGNOSIS — D509 Iron deficiency anemia, unspecified: Secondary | ICD-10-CM | POA: Diagnosis not present

## 2021-08-02 DIAGNOSIS — E1165 Type 2 diabetes mellitus with hyperglycemia: Secondary | ICD-10-CM | POA: Diagnosis not present

## 2021-08-02 DIAGNOSIS — Z8249 Family history of ischemic heart disease and other diseases of the circulatory system: Secondary | ICD-10-CM

## 2021-08-02 DIAGNOSIS — Z9049 Acquired absence of other specified parts of digestive tract: Secondary | ICD-10-CM

## 2021-08-02 DIAGNOSIS — F419 Anxiety disorder, unspecified: Secondary | ICD-10-CM | POA: Diagnosis not present

## 2021-08-02 DIAGNOSIS — R571 Hypovolemic shock: Secondary | ICD-10-CM | POA: Diagnosis not present

## 2021-08-02 DIAGNOSIS — Z86711 Personal history of pulmonary embolism: Secondary | ICD-10-CM

## 2021-08-02 DIAGNOSIS — R7989 Other specified abnormal findings of blood chemistry: Secondary | ICD-10-CM

## 2021-08-02 DIAGNOSIS — Z7952 Long term (current) use of systemic steroids: Secondary | ICD-10-CM

## 2021-08-02 DIAGNOSIS — E1151 Type 2 diabetes mellitus with diabetic peripheral angiopathy without gangrene: Secondary | ICD-10-CM | POA: Diagnosis present

## 2021-08-02 DIAGNOSIS — T380X5A Adverse effect of glucocorticoids and synthetic analogues, initial encounter: Secondary | ICD-10-CM | POA: Diagnosis not present

## 2021-08-02 DIAGNOSIS — Z902 Acquired absence of lung [part of]: Secondary | ICD-10-CM

## 2021-08-02 DIAGNOSIS — Z794 Long term (current) use of insulin: Secondary | ICD-10-CM

## 2021-08-02 DIAGNOSIS — Z7982 Long term (current) use of aspirin: Secondary | ICD-10-CM

## 2021-08-02 DIAGNOSIS — Z87891 Personal history of nicotine dependence: Secondary | ICD-10-CM

## 2021-08-02 DIAGNOSIS — K718 Toxic liver disease with other disorders of liver: Secondary | ICD-10-CM | POA: Diagnosis not present

## 2021-08-02 DIAGNOSIS — F32A Depression, unspecified: Secondary | ICD-10-CM | POA: Diagnosis not present

## 2021-08-02 DIAGNOSIS — I10 Essential (primary) hypertension: Secondary | ICD-10-CM | POA: Diagnosis present

## 2021-08-02 DIAGNOSIS — E785 Hyperlipidemia, unspecified: Secondary | ICD-10-CM | POA: Diagnosis not present

## 2021-08-02 DIAGNOSIS — Z888 Allergy status to other drugs, medicaments and biological substances status: Secondary | ICD-10-CM

## 2021-08-02 DIAGNOSIS — R0602 Shortness of breath: Secondary | ICD-10-CM | POA: Diagnosis not present

## 2021-08-02 DIAGNOSIS — Z7984 Long term (current) use of oral hypoglycemic drugs: Secondary | ICD-10-CM

## 2021-08-02 DIAGNOSIS — Z9221 Personal history of antineoplastic chemotherapy: Secondary | ICD-10-CM

## 2021-08-02 LAB — PROTIME-INR
INR: 1 (ref 0.8–1.2)
Prothrombin Time: 13 seconds (ref 11.4–15.2)

## 2021-08-02 LAB — COMPREHENSIVE METABOLIC PANEL
ALT: 747 U/L — ABNORMAL HIGH (ref 0–44)
AST: 1230 U/L — ABNORMAL HIGH (ref 15–41)
Albumin: 3 g/dL — ABNORMAL LOW (ref 3.5–5.0)
Alkaline Phosphatase: 983 U/L — ABNORMAL HIGH (ref 38–126)
Anion gap: 12 (ref 5–15)
BUN: 41 mg/dL — ABNORMAL HIGH (ref 8–23)
CO2: 20 mmol/L — ABNORMAL LOW (ref 22–32)
Calcium: 8.7 mg/dL — ABNORMAL LOW (ref 8.9–10.3)
Chloride: 102 mmol/L (ref 98–111)
Creatinine, Ser: 2.18 mg/dL — ABNORMAL HIGH (ref 0.44–1.00)
GFR, Estimated: 25 mL/min — ABNORMAL LOW (ref >60)
Glucose, Bld: 140 mg/dL — ABNORMAL HIGH (ref 70–99)
Potassium: 3.9 mmol/L (ref 3.5–5.1)
Sodium: 134 mmol/L — ABNORMAL LOW (ref 135–145)
Total Bilirubin: 2.7 mg/dL — ABNORMAL HIGH (ref 0.3–1.2)
Total Protein: 6.5 g/dL (ref 6.5–8.1)

## 2021-08-02 LAB — CBC WITH DIFFERENTIAL/PLATELET
Abs Immature Granulocytes: 0.02 10*3/uL (ref 0.00–0.07)
Basophils Absolute: 0 10*3/uL (ref 0.0–0.1)
Basophils Relative: 1 %
Eosinophils Absolute: 0.1 10*3/uL (ref 0.0–0.5)
Eosinophils Relative: 1 %
HCT: 29.4 % — ABNORMAL LOW (ref 36.0–46.0)
Hemoglobin: 9.6 g/dL — ABNORMAL LOW (ref 12.0–15.0)
Immature Granulocytes: 0 %
Lymphocytes Relative: 15 %
Lymphs Abs: 0.7 10*3/uL (ref 0.7–4.0)
MCH: 31.2 pg (ref 26.0–34.0)
MCHC: 32.7 g/dL (ref 30.0–36.0)
MCV: 95.5 fL (ref 80.0–100.0)
Monocytes Absolute: 0.8 10*3/uL (ref 0.1–1.0)
Monocytes Relative: 16 %
Neutro Abs: 3.1 10*3/uL (ref 1.7–7.7)
Neutrophils Relative %: 67 %
Platelets: 285 10*3/uL (ref 150–400)
RBC: 3.08 MIL/uL — ABNORMAL LOW (ref 3.87–5.11)
RDW: 15.1 % (ref 11.5–15.5)
WBC: 4.7 10*3/uL (ref 4.0–10.5)
nRBC: 0 % (ref 0.0–0.2)

## 2021-08-02 LAB — HEPATIC FUNCTION PANEL
ALT: 777 U/L — ABNORMAL HIGH (ref 0–44)
AST: 1220 U/L — ABNORMAL HIGH (ref 15–41)
Albumin: 3.2 g/dL — ABNORMAL LOW (ref 3.5–5.0)
Bilirubin, Direct: 1.9 mg/dL — ABNORMAL HIGH (ref 0.0–0.2)
Indirect Bilirubin: 0.7 mg/dL (ref 0.3–0.9)
Total Bilirubin: 2.6 mg/dL — ABNORMAL HIGH (ref 0.3–1.2)
Total Protein: 6.5 g/dL (ref 6.5–8.1)

## 2021-08-02 LAB — TROPONIN I (HIGH SENSITIVITY)
Troponin I (High Sensitivity): 24 ng/L — ABNORMAL HIGH (ref <18)
Troponin I (High Sensitivity): 27 ng/L — ABNORMAL HIGH (ref <18)

## 2021-08-02 LAB — URINALYSIS, ROUTINE W REFLEX MICROSCOPIC
Bilirubin Urine: NEGATIVE
Glucose, UA: >=500 mg/dL — AB
Hgb urine dipstick: NEGATIVE
Ketones, ur: NEGATIVE mg/dL
Nitrite: NEGATIVE
Protein, ur: 100 mg/dL — AB
Specific Gravity, Urine: 1.006 (ref 1.005–1.030)
pH: 5 (ref 5.0–8.0)

## 2021-08-02 LAB — RESP PANEL BY RT-PCR (FLU A&B, COVID) ARPGX2
Influenza A by PCR: NEGATIVE
Influenza B by PCR: NEGATIVE
SARS Coronavirus 2 by RT PCR: NEGATIVE

## 2021-08-02 LAB — GLUCOSE, CAPILLARY
Glucose-Capillary: 121 mg/dL — ABNORMAL HIGH (ref 70–99)
Glucose-Capillary: 164 mg/dL — ABNORMAL HIGH (ref 70–99)

## 2021-08-02 LAB — HIV ANTIBODY (ROUTINE TESTING W REFLEX): HIV Screen 4th Generation wRfx: NONREACTIVE

## 2021-08-02 LAB — LACTIC ACID, PLASMA
Lactic Acid, Venous: 1 mmol/L (ref 0.5–1.9)
Lactic Acid, Venous: 0.7 mmol/L (ref 0.5–1.9)

## 2021-08-02 MED ORDER — METOPROLOL SUCCINATE ER 50 MG PO TB24
50.0000 mg | ORAL_TABLET | Freq: Every day | ORAL | Status: DC
  Administered 2021-08-03 – 2021-08-09 (×7): 50 mg via ORAL
  Filled 2021-08-02 (×7): qty 1

## 2021-08-02 MED ORDER — HEPARIN SODIUM (PORCINE) 5000 UNIT/ML IJ SOLN
5000.0000 [IU] | Freq: Three times a day (TID) | INTRAMUSCULAR | Status: DC
  Administered 2021-08-02 – 2021-08-09 (×19): 5000 [IU] via SUBCUTANEOUS
  Filled 2021-08-02 (×20): qty 1

## 2021-08-02 MED ORDER — INSULIN ASPART 100 UNIT/ML IJ SOLN
8.0000 [IU] | Freq: Three times a day (TID) | INTRAMUSCULAR | Status: DC
  Administered 2021-08-02 – 2021-08-05 (×7): 8 [IU] via SUBCUTANEOUS
  Filled 2021-08-02 (×7): qty 1

## 2021-08-02 MED ORDER — TRAMADOL HCL 50 MG PO TABS: 50.0000 mg | ORAL_TABLET | Freq: Two times a day (BID) | ORAL | Status: DC | PRN

## 2021-08-02 MED ORDER — SODIUM CHLORIDE 0.9 % IV BOLUS
1000.0000 mL | Freq: Once | INTRAVENOUS | Status: AC
  Administered 2021-08-02: 1000 mL via INTRAVENOUS

## 2021-08-02 MED ORDER — DULOXETINE HCL 20 MG PO CPEP
40.0000 mg | ORAL_CAPSULE | Freq: Two times a day (BID) | ORAL | Status: DC
  Administered 2021-08-02 – 2021-08-09 (×14): 40 mg via ORAL
  Filled 2021-08-02 (×14): qty 2

## 2021-08-02 MED ORDER — CARBOXYMETHYLCELLULOSE SODIUM 0.5 % OP SOLN: 1.0000 [drp] | Freq: Two times a day (BID) | OPHTHALMIC | Status: DC

## 2021-08-02 MED ORDER — INSULIN GLARGINE-YFGN 100 UNIT/ML ~~LOC~~ SOLN
16.0000 [IU] | Freq: Every day | SUBCUTANEOUS | Status: DC
  Administered 2021-08-02 – 2021-08-04 (×3): 16 [IU] via SUBCUTANEOUS
  Filled 2021-08-02 (×4): qty 0.16

## 2021-08-02 MED ORDER — ADULT MULTIVITAMIN W/MINERALS CH
1.0000 | ORAL_TABLET | Freq: Every morning | ORAL | Status: DC
  Administered 2021-08-03 – 2021-08-09 (×7): 1 via ORAL
  Filled 2021-08-02 (×7): qty 1

## 2021-08-02 MED ORDER — METHYLPREDNISOLONE SODIUM SUCC 125 MG IJ SOLR
50.0000 mg | INTRAMUSCULAR | Status: DC
  Administered 2021-08-03: 50 mg via INTRAVENOUS
  Filled 2021-08-02: qty 2

## 2021-08-02 MED ORDER — DRONABINOL 2.5 MG PO CAPS
2.5000 mg | ORAL_CAPSULE | Freq: Two times a day (BID) | ORAL | Status: DC
  Administered 2021-08-02 – 2021-08-09 (×15): 2.5 mg via ORAL
  Filled 2021-08-02 (×15): qty 1

## 2021-08-02 MED ORDER — POLYVINYL ALCOHOL 1.4 % OP SOLN
1.0000 [drp] | OPHTHALMIC | Status: DC | PRN
  Administered 2021-08-03: 1 [drp] via OPHTHALMIC
  Filled 2021-08-02 (×2): qty 15

## 2021-08-02 MED ORDER — ONDANSETRON HCL 4 MG PO TABS: 4.0000 mg | ORAL_TABLET | Freq: Four times a day (QID) | ORAL | Status: DC | PRN

## 2021-08-02 MED ORDER — GABAPENTIN 100 MG PO CAPS
100.0000 mg | ORAL_CAPSULE | Freq: Two times a day (BID) | ORAL | Status: DC
  Administered 2021-08-02 – 2021-08-09 (×14): 100 mg via ORAL
  Filled 2021-08-02 (×14): qty 1

## 2021-08-02 MED ORDER — SODIUM CHLORIDE 0.9 % IV SOLN: INTRAVENOUS | Status: DC

## 2021-08-02 MED ORDER — MORPHINE SULFATE (PF) 2 MG/ML IV SOLN: 2.0000 mg | INTRAVENOUS | Status: DC | PRN

## 2021-08-02 MED ORDER — OXYCODONE HCL 5 MG PO TABS: 5.0000 mg | ORAL_TABLET | ORAL | Status: DC | PRN

## 2021-08-02 MED ORDER — ISOSORBIDE DINITRATE 30 MG PO TABS
30.0000 mg | ORAL_TABLET | Freq: Three times a day (TID) | ORAL | Status: DC
  Administered 2021-08-02 – 2021-08-09 (×21): 30 mg via ORAL
  Filled 2021-08-02 (×22): qty 1

## 2021-08-02 MED ORDER — METHYLPREDNISOLONE SODIUM SUCC 125 MG IJ SOLR
50.0000 mg | Freq: Once | INTRAMUSCULAR | Status: AC
  Administered 2021-08-02: 50 mg via INTRAVENOUS
  Filled 2021-08-02: qty 2

## 2021-08-02 MED ORDER — PROCHLORPERAZINE MALEATE 10 MG PO TABS
10.0000 mg | ORAL_TABLET | Freq: Four times a day (QID) | ORAL | Status: DC | PRN
  Filled 2021-08-02: qty 1

## 2021-08-02 MED ORDER — ASPIRIN EC 81 MG PO TBEC
81.0000 mg | DELAYED_RELEASE_TABLET | Freq: Every morning | ORAL | Status: DC
  Administered 2021-08-03 – 2021-08-09 (×7): 81 mg via ORAL
  Filled 2021-08-02 (×7): qty 1

## 2021-08-02 MED ORDER — DIPHENHYDRAMINE HCL 25 MG PO CAPS
25.0000 mg | ORAL_CAPSULE | Freq: Every day | ORAL | Status: DC
  Administered 2021-08-02 – 2021-08-08 (×7): 25 mg via ORAL
  Filled 2021-08-02 (×7): qty 1

## 2021-08-02 MED ORDER — ONDANSETRON HCL 4 MG/2ML IJ SOLN
4.0000 mg | Freq: Four times a day (QID) | INTRAMUSCULAR | Status: DC | PRN
  Administered 2021-08-04: 11:00:00 4 mg via INTRAVENOUS
  Filled 2021-08-02: qty 2

## 2021-08-02 NOTE — Telephone Encounter (Signed)
Transition Care Management Follow-up Telephone Call Date of discharge and from where: 07/28/2021 from [facility] How have you been since you were released from the hospital? Patient stated that she is feeling okay. Patient did not have any questions at this time.  Any questions or concerns? No  Items Reviewed: Did the pt receive and understand the discharge instructions provided? Yes  Medications obtained and verified? Yes  Patient has not picked up the dronabionl from the pharmacy.  Other? No  Any new allergies since your discharge? No  Dietary orders reviewed? No Do you have support at home? Yes   Functional Questionnaire: (I = Independent and D = Dependent) ADLs: I  Bathing/Dressing- I  Meal Prep- I  Eating- I  Maintaining continence- I  Transferring/Ambulation- I  Managing Meds- I   Follow up appointments reviewed:  PCP Hospital f/u appt confirmed? No   Specialist Hospital f/u appt confirmed? Yes  Scheduled to see Earlie Server, MD on 08/02/2021 @ 11:00am. Are transportation arrangements needed? No  If their condition worsens, is the pt aware to call PCP or go to the Emergency Dept.? Yes Was the patient provided with contact information for the PCP's office or ED? Yes Was to pt encouraged to call back with questions or concerns? Yes

## 2021-08-02 NOTE — ED Notes (Signed)
Voided large amount on bedpan.  Cloudy urine to lab.

## 2021-08-02 NOTE — ED Notes (Signed)
Margaree Mackintosh RN aware of assigned bed

## 2021-08-02 NOTE — ED Triage Notes (Signed)
Pt to ED via POV from the cancer center, she was seen here last week for the same. Her LFTS were elevated and her blood pressure has been running low. Pt  reports that she feels weak, denies any pain.

## 2021-08-02 NOTE — ED Provider Notes (Signed)
[facility] Provider Note    Event Date/Time   First MD Initiated Contact with Patient 08/02/21 1330     (approximate)   History   Hypotension   HPI  [name] [name] is a 66 y.o. female cad, diabetes, chemotherapy who got chemo and now on immunotherapy and now with elevated LFTS who is followed at the cancer center who comes in for concern for elevated LFTs, low pressure and high heart rate.  Dr. [name] wanted her to be placed on steroids.  Patient reports she is having some overall weakness.  She denies any chest pain.  She reported a little bit of shortness of breath but she thinks it is related to her low blood pressure and now that her blood pressure is better she denies any shortness of breath at this time.  Denies any abdominal pain  Physical Exam   Triage Vital Signs: ED Triage Vitals  Enc Vitals Group     BP 08/02/21 1227 (!) 86/61     Pulse Rate 08/02/21 1227 (!) 107     Resp 08/02/21 1227 16     Temp 08/02/21 1227 98.1 F (36.7 C)     Temp Source 08/02/21 1227 Oral     SpO2 08/02/21 1227 98 %     Weight 08/02/21 1228 114 lb (51.7 kg)     Height 08/02/21 1228 5\' 5"  (1.651 m)     Head Circumference --      Peak Flow --      Pain Score 08/02/21 1228 0     Pain Loc --      Pain Edu? --      Excl. in [place]? --     Most recent vital signs: Vitals:   08/02/21 1227  BP: (!) 86/61  Pulse: (!) 107  Resp: 16  Temp: 98.1 F (36.7 C)  SpO2: 98%     General: Awake, no distress.  CV:  Good peripheral perfusion. Port noted  Resp:  Normal effort. Clear lungs  Abd:  No distention. Soft non tender  Other:  No leg swelling L, amputation R    ED Results / Procedures / Treatments   Labs (all labs ordered are listed, but only abnormal results are displayed) Labs Reviewed  CULTURE, BLOOD (ROUTINE X 2)  CULTURE, BLOOD (ROUTINE X 2)  URINALYSIS, ROUTINE W REFLEX MICROSCOPIC  LACTIC ACID, PLASMA  LACTIC ACID, PLASMA  PROTIME-INR  CBG MONITORING,  ED  TROPONIN I (HIGH SENSITIVITY)     EKG  My interpretation of EKG:  Sinus tachycardia rate of 108 with ST elevation in lead III, aVF with T wave inversions in 1 and aVL with normal intervals  We will get repeat EKG normal sinus rate of 97, no st elevation, no twi, normal intervals.   RADIOLOGY I have reviewed the xray personally and agree with radiology read ' RIGHT IJ Port-A-Cath with tip over the distal superior vena cava. EKG leads project over the chest.   LEFT hemidiaphragm remains elevated. Distortion of cardiomediastinal contours related to this hemidiaphragmatic elevation with similar appearance to prior imaging. Heart size and mediastinal contours are normal accounting for this finding. No lobar consolidation. No sign of effusion or pneumothorax.   On limited assessment there is no acute skeletal process.  PROCEDURES:  Critical Care performed: No  .1-3 Lead EKG Interpretation Performed by: Vanessa Motley, MD Authorized by: Vanessa Nuangola, MD     Interpretation: normal     ECG rate:  100   ECG rate assessment: normal     Rhythm: sinus rhythm     Ectopy: none     Conduction: normal   Comments:     Occasional sinus tachy    MEDICATIONS ORDERED IN ED: Medications  methylPREDNISolone sodium succinate (SOLU-MEDROL) 125 mg/2 mL injection 50 mg (has no administration in time range)  sodium chloride 0.9 % bolus 1,000 mL (1,000 mLs Intravenous Bolus 08/02/21 1339)     IMPRESSION / MDM / ASSESSMENT AND PLAN / ED COURSE  I reviewed the triage vital signs and the nursing notes.   Patient comes in with weakness with concerns for transaminitis, AKI from oncology clinic  Discussed with Dr. [name]- [place] no biliary obstruction. Porbably immunotherapy related transamititis.  Repeat labs were worse. Now with AKI from nausea. Wants IVF, IV steroids 1mg /1kg solumedral.  Patient denies any shortness of breath at this time or any chest pain or abdominal pain.  However EKG upfront  was concerning with some ST elevation.  We will get a repeat EKG and add on a troponin.  We will discussed the case with the STEMI doctor  Discussed with Dr End and as rate has come down EKG has normalized.  Does not feel he this represents a STEMI at this time.  Her troponin was slightly elevated.  Will get work-up for the shortness of breath but I suspect that it could just be from her dehydration.  She deneis any SOB or chest pain now. Given she is a cancer patient and I added on some ultrasounds to look for DVTs as well as a chest x-ray to look for any pneumonia and COVID, flu swab.  I considered CT PE but at this time she is denying any shortness of breath is not hypoxic and with her kidney function she would not be able to get it.  Therefore I think it start with ultrasounds and when patient is admitted they can decide if she needs further testing if she continues to have shortness of breath.  I did discuss with the hospital team for admission   The patient is on the cardiac monitor to evaluate for evidence of arrhythmia and/or significant heart rate changes.   FINAL CLINICAL IMPRESSION(S) / ED DIAGNOSES   Final diagnoses:  Liver function test abnormality  AKI (acute kidney injury) ([place])  Weakness     Rx / DC Orders   ED Discharge Orders     None        Note:  This document was prepared using Dragon voice recognition software and may include unintentional dictation errors.   Vanessa Hindman, MD 08/02/21 1538

## 2021-08-02 NOTE — ED Notes (Signed)
Repeat EKG obtained per MD request

## 2021-08-02 NOTE — ED Notes (Signed)
Pt presents to ED stating she was sent from cancer center for hypotension, pt states she has been feeling more weak than normal. Pt states HX of lung cancer. Pt denies pain, pt denies SOB to this RN. Pt denies fevers or chills. Pt denies N/V/D.

## 2021-08-02 NOTE — ED Notes (Signed)
First Nurse Note:  Pt to ED via POV, pt was sent over from cancer center for elevated LFTs, low pressure, and high heart rate. Dr. [name] would like someone to call her when pt seen. States pt needs to be admitted for IV steroids.   Dr. [name]- [phone]

## 2021-08-02 NOTE — ED Notes (Signed)
Lab called to assist with blood draw.

## 2021-08-02 NOTE — Progress Notes (Signed)
Hematology/Oncology Progress note [phone] [phone]      Patient Care Team: [name], MD as PCP - General (Family Medicine) Earlie Server, MD as Consulting Physician (Hematology and Oncology)  REFERRING PROVIDER: [name], MD  CHIEF COMPLAINTS/REASON FOR VISIT:  Follow up for stage IIIA lung cancer  HISTORY OF PRESENTING ILLNESS:  Patient was recently seen by urology Dr. Candiss Norse ski for evaluation of microscopic hematuria, frequent UTI. 11/17/2020, CT hematuria work-up was obtained which showed no evidence of urinary tract calculus or hydronephrosis.  Small left renal cyst. Incidental findings of 1.9 x 1.2 cm left lower lobe pulmonary nodule worrisome for malignancy. Patient was referred to establish care with oncology for further evaluation  .  Patient denies any shortness of breath, cough, hemoptysis, unintentional weight loss, fever or night sweats. She currently smokes 1 cigar/day.  She started smoking cigarettes at age of 89.  She quitted at age of 47 and restarted smoking cigar a few years ago.  Patient has a history of depression and is on Cymbalta.  Diabetes, hyperlipidemia, hypertension, peripheral artery disease Per patient she also has history of heart attack in 2011. History of right lower extremity BKA in 2021, 02/15/2020 history of pulmonary embolism involving segmental and subsegmental branches of right upper, middle, lower lobe pulmonary arteries.  She was put on on apixaban. Patient lives with her aunt.  12/07/2020, PET scan showed left lower lobe 1.3 x 1.8 cm lung nodule with SUV of 6.5.  Suspect early stage primary lung neoplasm.  No hilar or mediastinal lymphadenopathy activity.  # 12/23/2020 S/p CT guided biopsy of left lower lobe mass.  Pathology showed non-small cell carcinoma, favor adenocarcinoma.  Positive for TTF-1, negative for p40.     # 01/17/2021, patient underwent left lower lobectomy with lymph node dissection. Pathology  showed invasive poorly differentiated adenocarcinoma, 2.2 cm.  Tumor abuts pleura but visceral pleural surface is not involved by carcinoma.  LVI invasion is present, resection margins are negative for carcinoma.  10 lymph nodes were harvested and 3 lymph nodes were involved with carcinoma. pT1c pN2 Patient's case was discussed at [facility] oncology tumor board.  And consensus recommendation was adjuvant chemotherapy and sent tissue for molecular/PD-L1 testing.  Foundation one testing showed PD-L1 1,  No reportable alterations ERBB2 S310F  # 02/28/2021 adjuvant carboplatin and Taxol 07/29/2021 Medi port placed by IR.   INTERVAL HISTORY [name] [name] is a 66 y.o. female who has above history reviewed by me today presents for follow up visit for Stage IIIA lung cancer.  BP is low today. She reports intermittent nausea. No vomiting.  Decreased appetite and poor oral intake. Patient was sent to ER and had negative ultrasound abdomen.  There is no bile duct dilatation.  Patient was discharged home. Today she presents for following up of liver function.  She takes her blood pressure medication today.   Review of Systems  Constitutional:  Positive for appetite change and fatigue. Negative for chills and fever.  HENT:   Negative for hearing loss and voice change.   Eyes:  Negative for eye problems.  Respiratory:  Negative for chest tightness and cough.   Cardiovascular:  Negative for chest pain.  Gastrointestinal:  Positive for nausea. Negative for abdominal distention, abdominal pain, blood in stool and diarrhea.  Endocrine: Negative for hot flashes.  Genitourinary:  Negative for difficulty urinating, dysuria and frequency.   Musculoskeletal:  Negative for arthralgias.  Skin:  Negative for itching and rash.  Neurological:  Negative for extremity weakness.  Hematological:  Negative for adenopathy.  Psychiatric/Behavioral:  Negative for confusion.    MEDICAL HISTORY:  Past Medical History:   Diagnosis Date   Anemia    Cancer ([place])    CHF (congestive heart failure) ([place])    02/16/20 HFrEF 20-25% in setting of acute DVT/PE, underlying CAD   CKD (chronic kidney disease)    Coronary artery disease    02/20/20: CTO pRCA with left-to-right collaterals, 50% mLAD, 80% OM1, medical therapy   Depression    Diabetes ([place])    Diabetes mellitus without complication ([place])    DVT (deep venous thrombosis) ([place]) 02/16/2020   RLE DVT proximal veins 02/16/20 Duplex   Hyperlipidemia    Hypertension    Myocardial infarction [facility]) 2009   Stress related per patient; NSTEMI 2012 100% RCA with left-to-right collaterals   PE (pulmonary thromboembolism) ([place]) 02/15/2020   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, RLE BKA   Peripheral artery disease ([place])     SURGICAL HISTORY: Past Surgical History:  Procedure Laterality Date   IR IMAGING GUIDED PORT INSERTION  07/27/2021   Right lower extremity BKA Right     SOCIAL HISTORY: Social History   Socioeconomic History   Marital status: Married    Spouse name: Not on file   Number of children: Not on file   Years of education: Not on file   Highest education level: Not on file  Occupational History   Not on file  Tobacco Use   Smoking status: Former    Packs/day: 0.25    Types: Cigars, Cigarettes    Quit date: 01/17/2021    Years since quitting: 0.5   Smokeless tobacco: Never  Vaping Use   Vaping Use: Never used  Substance and Sexual Activity   Alcohol use: Never   Drug use: Not Currently   Sexual activity: Not Currently  Other Topics Concern   Not on file  Social History Narrative   Not on file   Social Determinants of Health   Financial Resource Strain: Not on file  Food Insecurity: Not on file  Transportation Needs: Not on file  Physical Activity: Not on file  Stress: Not on file  Social Connections: Not on file  Intimate Partner Violence: Not on file    FAMILY HISTORY: Family History  Problem Relation Age of  Onset   Heart disease Mother    Diabetes Mother    Heart disease Father    Diabetes Father     ALLERGIES:  is allergic to lisinopril.  MEDICATIONS:  No current facility-administered medications for this visit.   No current outpatient medications on file.   Facility-Administered Medications Ordered in Other Visits  Medication Dose Route Frequency Provider Last Rate Last Admin   0.9 %  sodium chloride infusion   Intravenous Continuous Leslee Home, DO 75 mL/hr at 08/02/21 1828 New Bag at 08/02/21 1828   [START ON 08/03/2021] aspirin EC tablet 81 mg  81 mg Oral q AM Imagene Sheller S, DO       diphenhydrAMINE (BENADRYL) capsule 25 mg  25 mg Oral QHS Anwar, Shayan S, DO       dronabinol (MARINOL) capsule 2.5 mg  2.5 mg Oral BID AC Anwar, Shayan S, DO   2.5 mg at 08/02/21 1825   DULoxetine (CYMBALTA) DR capsule 40 mg  40 mg Oral BID Imagene Sheller S, DO       gabapentin (NEURONTIN) capsule 100 mg  100 mg Oral  BID Imagene Sheller S, DO       heparin injection 5,000 Units  5,000 Units Subcutaneous Q8H Anwar, Shayan S, DO       insulin aspart (novoLOG) injection 8 Units  8 Units Subcutaneous TID PC Anwar, Shayan S, DO       insulin glargine-yfgn (SEMGLEE) injection 16 Units  16 Units Subcutaneous Q2200 Imagene Sheller S, DO       isosorbide dinitrate (ISORDIL) tablet 30 mg  30 mg Oral TID Leslee Home, DO       [START ON 08/03/2021] methylPREDNISolone sodium succinate (SOLU-MEDROL) 125 mg/2 mL injection 50 mg  50 mg Intravenous Q24H Imagene Sheller S, DO       [START ON 08/03/2021] metoprolol succinate (TOPROL-XL) 24 hr tablet 50 mg  50 mg Oral Daily Imagene Sheller S, DO       [START ON 08/03/2021] multivitamin with minerals tablet 1 tablet  1 tablet Oral q AM Imagene Sheller S, DO       ondansetron (ZOFRAN) tablet 4 mg  4 mg Oral Q6H PRN Imagene Sheller S, DO       Or   ondansetron (ZOFRAN) injection 4 mg  4 mg Intravenous Q6H PRN Imagene Sheller S, DO       polyvinyl alcohol (LIQUIFILM TEARS) 1.4 %  ophthalmic solution 1 drop  1 drop Both Eyes PRN Benita Gutter, RPH       prochlorperazine (COMPAZINE) tablet 10 mg  10 mg Oral Q6H PRN Imagene Sheller S, DO         PHYSICAL EXAMINATION: ECOG PERFORMANCE STATUS: 1 - Symptomatic but completely ambulatory Vitals:   08/02/21 1123  BP: (!) 86/67  Pulse: (!) 108  Temp: (!) 96.2 F (35.7 C)   Filed Weights   08/02/21 1123  Weight: 110 lb (49.9 kg)    Physical Exam Constitutional:      General: She is not in acute distress.    Appearance: She is not ill-appearing.  HENT:     Head: Normocephalic and atraumatic.  Eyes:     General: No scleral icterus. Cardiovascular:     Rate and Rhythm: Normal rate and regular rhythm.     Heart sounds: Normal heart sounds.  Pulmonary:     Effort: Pulmonary effort is normal. No respiratory distress.     Breath sounds: No wheezing.     Comments: Absent breath sounds left basilar Abdominal:     General: Bowel sounds are normal. There is no distension.     Palpations: Abdomen is soft.  Musculoskeletal:        General: No deformity. Normal range of motion.     Cervical back: Normal range of motion and neck supple.     Comments: Right lower extremity BKA  Skin:    General: Skin is warm and dry.     Findings: No erythema or rash.  Neurological:     Mental Status: She is alert and oriented to person, place, and time. Mental status is at baseline.     Cranial Nerves: No cranial nerve deficit.     Coordination: Coordination normal.  Psychiatric:        Mood and Affect: Mood normal.    LABORATORY DATA:  I have reviewed the data as listed Lab Results  Component Value Date   WBC 4.7 08/02/2021   HGB 9.6 (L) 08/02/2021   HCT 29.4 (L) 08/02/2021   MCV 95.5 08/02/2021   PLT 285 08/02/2021   Recent Labs  08/10/20 1658 11/17/20 1149 07/07/21 0919 07/28/21 0855 08/02/21 1106 08/02/21 1108  NA 136   < > 137 132*  --  134*  K 4.6   < > 4.3 3.9  --  3.9  CL 101   < > 108 100  --  102   CO2 17*   < > 24 21*  --  20*  GLUCOSE 485*   < > 118* 175*  --  140*  BUN 39*   < > 20 52*  --  41*  CREATININE 1.18*   < > 1.39* 2.13*  --  2.18*  CALCIUM 9.2   < > 8.6* 8.4*  --  8.7*  GFRNONAA 50*   < > 43* 26*  --  25*  GFRAA 57*  --   --   --   --   --   PROT  --    < > 5.9* 6.5 6.5 6.5  ALBUMIN 3.2*   < > 3.3* 3.0* 3.2* 3.0*  AST  --    < > 27 987* 1,220* 1,230*  ALT  --    < > 18 707* 777* 747*  ALKPHOS  --    < > 114 601*  --  983*  BILITOT  --    < > 0.5 1.4* 2.6* 2.7*  BILIDIR  --   --   --   --  1.9*  --   IBILI  --   --   --   --  0.7  --    < > = values in this interval not displayed.    Iron/TIBC/Ferritin/ %Sat    Component Value Date/Time   IRON 44 12/30/2020 1437   TIBC 347 12/30/2020 1437   FERRITIN 15 12/30/2020 1437   IRONPCTSAT 13 12/30/2020 1437      RADIOGRAPHIC STUDIES: I have personally reviewed the radiological images as listed and agreed with the findings in the report. US Venous Img Lower Bilateral  Result Date: 08/02/2021 CLINICAL DATA:  Shortness of breath EXAM: BILATERAL LOWER EXTREMITY VENOUS DOPPLER ULTRASOUND TECHNIQUE: Gray-scale sonography with graded compression, as well as color Doppler and duplex ultrasound were performed to evaluate the lower extremity deep venous systems from the level of the common femoral vein and including the common femoral, femoral, profunda femoral, popliteal and calf veins including the posterior tibial, peroneal and gastrocnemius veins when visible. The superficial great saphenous vein was also interrogated. Spectral Doppler was utilized to evaluate flow at rest and with distal augmentation maneuvers in the common femoral, femoral and popliteal veins. COMPARISON:  None. FINDINGS: RIGHT LOWER EXTREMITY Common Femoral Vein: No evidence of thrombus. Normal compressibility, respiratory phasicity and response to augmentation. Saphenofemoral Junction: No evidence of thrombus. Normal compressibility and flow on color Doppler  imaging. Profunda Femoral Vein: No evidence of thrombus. Normal compressibility and flow on color Doppler imaging. Femoral Vein: No evidence of thrombus. Normal compressibility, respiratory phasicity and response to augmentation. Popliteal Vein: No evidence of thrombus. Normal compressibility, respiratory phasicity and response to augmentation. Calf Veins: Prior below-knee amputation. Superficial Great Saphenous Vein: No evidence of thrombus. Normal compressibility. Venous Reflux:  None. Other Findings:  None. LEFT LOWER EXTREMITY Common Femoral Vein: No evidence of thrombus. Normal compressibility, respiratory phasicity and response to augmentation. Saphenofemoral Junction: No evidence of thrombus. Normal compressibility and flow on color Doppler imaging. Profunda Femoral Vein: No evidence of thrombus. Normal compressibility and flow on color Doppler imaging. Femoral Vein: No evidence of thrombus. Normal compressibility, respiratory phasicity and response  to augmentation. Popliteal Vein: No evidence of thrombus. Normal compressibility, respiratory phasicity and response to augmentation. Calf Veins: No evidence of thrombus. Normal compressibility and flow on color Doppler imaging. Superficial Great Saphenous Vein: No evidence of thrombus. Normal compressibility. Venous Reflux:  None. Other Findings:  None. IMPRESSION: No evidence of deep venous thrombosis in either lower extremity. Electronically Signed   By: Maurine Simmering M.D.   On: 08/02/2021 15:37   DG Chest Portable 1 View  Result Date: 08/02/2021 CLINICAL DATA:  A 66 year old female presents for evaluation of shortness of breath. EXAM: PORTABLE CHEST 1 VIEW COMPARISON:  April 26, 2021. FINDINGS: RIGHT IJ Port-A-Cath with tip over the distal superior vena cava. EKG leads project over the chest. LEFT hemidiaphragm remains elevated. Distortion of cardiomediastinal contours related to this hemidiaphragmatic elevation with similar appearance to prior imaging.  Heart size and mediastinal contours are normal accounting for this finding. No lobar consolidation. No sign of effusion or pneumothorax. On limited assessment there is no acute skeletal process. IMPRESSION: Electronically Signed   By: Zetta Bills M.D.   On: 08/02/2021 14:52   IR IMAGING GUIDED PORT INSERTION  Result Date: 07/27/2021 INDICATION: History of lung cancer EXAM: Ultrasound-guided puncture of the right internal jugular vein Placement of a right-sided chest port using fluoroscopic guidance MEDICATIONS: None ANESTHESIA/SEDATION: Moderate (conscious) sedation was employed during this procedure. A total of Versed 1 mg and Fentanyl 50 mcg was administered intravenously. Moderate Sedation Time: 20 minutes. The patient's level of consciousness and vital signs were monitored continuously by radiology nursing throughout the procedure under my direct supervision. FLUOROSCOPY TIME:  Fluoroscopy Time: 0.3 minutes (2.5 mGy) COMPLICATIONS: None immediate. PROCEDURE: Informed written consent was obtained from the patient after a thorough discussion of the procedural risks, benefits and alternatives. All questions were addressed. Maximal Sterile Barrier Technique was utilized including caps, mask, sterile gowns, sterile gloves, sterile drape, hand hygiene and skin antiseptic. A timeout was performed prior to the initiation of the procedure. The patient was placed supine on the exam table. The right neck and chest was prepped and draped in the standard sterile fashion. A preliminary ultrasound of the right neck was performed and demonstrates a patent right internal jugular vein. A permanent ultrasound image was stored in the electronic medical record. The overlying skin was anesthetized with 1% Lidocaine. Using ultrasound guidance, access was obtained into the right internal jugular vein using a 21 gauge micropuncture set. A wire was advanced into the SVC, a short incision was made at the puncture site, and serial  dilatation performed. Next, in an ipsilateral infraclavicular location, an incision was made at the site of the subcutaneous reservoir. Blunt dissection was used to open a pocket to contain the reservoir. A subcutaneous tunnel was then created from the port site to the puncture site. A(n) 8 Fr single lumen catheter was advanced through the tunnel. The catheter was attached to the port and this was placed in the subcutaneous pocket. Under fluoroscopic guidance, a peel away sheath was placed, and the catheter was trimmed to the appropriate length and was advanced into the central veins. The catheter length is 22 cm. The tip of the catheter lies near the superior cavoatrial junction. The port flushes and aspirates appropriately. The port was flushed and locked with heparinized saline. The port pocket was closed in 2 layers using 3-0 and 4-0 Vicryl/absorbable suture. Dermabond was also applied to both incisions. The patient tolerated the procedure well and was transferred to recovery in stable condition. IMPRESSION:  Successful placement of a right chest port via the right internal jugular vein. The port is ready for immediate use. Electronically Signed   By: Albin Felling M.D.   On: 07/27/2021 12:30   US ABDOMEN LIMITED RUQ (LIVER/GB)  Result Date: 07/28/2021 CLINICAL DATA:  Elevated liver enzymes. EXAM: ULTRASOUND ABDOMEN LIMITED RIGHT UPPER QUADRANT COMPARISON:  November 17, 2020. FINDINGS: Gallbladder: Patient is post cholecystectomy as evidenced on prior CT imaging. Common bile duct: Diameter: 5.4 mm Liver: No focal lesion identified. Within normal limits in parenchymal echogenicity. Portal vein is patent on color Doppler imaging with normal direction of blood flow towards the liver. Other: None. IMPRESSION: Post cholecystectomy without signs of biliary duct distension. No parenchymal abnormalities noted in the liver. Electronically Signed   By: Zetta Bills M.D.   On: 07/28/2021 15:13      ASSESSMENT & PLAN:   1. Transaminitis   2. Non-small cell cancer of left lung ([place])   3. AKI (acute kidney injury) ([place])     Cancer Staging  Non-small cell lung cancer [facility]) Staging form: Lung, AJCC 8th Edition - Pathologic stage from 01/28/2021: Stage IIIA (pT1c, pN2, cM0) - Signed by Earlie Server, MD on 02/17/2021  #Stage IIIA left lung non-small cell lung cancer-.  Poorly differentiated adenocarcinoma.  Status post left lower lobectomy and lymph node dissection pT1c pN2 [pathology addendum changed to N2]  She has negative surgical margin, no clear benefit of postoperative RT.  S/p 4 cycles of adjuvant chemotherapy Carboplatin and Taxol.  S/p 1 cycle of Tecentriq.  Hold Tecentriq.   Acute LFT elevation, likely due to immunotherapy induced liver toxicity. She has also had acute kidney function secondary to decreased oral intake.  I Recommend patient to go to emergency room to be admitted.  Discussed with admitting ER physician and hospitalist.  Recommend starting patient on IV Solu-Medrol 1 mg/kg daily.  Monitor liver function daily. IV hydration  #Depression, she is on Duloxetine. She has major life stress currently I recommend her to see psychiatrist. Patient declined.    All questions were answered. The patient knows to call the clinic with any problems questions or concerns.  cc [name], MD   Return of visit:  TBD  Earlie Server, MD, PhD 08/02/2021

## 2021-08-02 NOTE — ED Notes (Signed)
Lab at bedside obtaining labs

## 2021-08-02 NOTE — H&P (Signed)
H&P:    [name] [name]   WUJ:[id number] DOB: 1955/10/06 DOA: 08/02/2021  PCP: [name], MD  Chief Complaint: Abnormal labs, hypotension, tachycardia   History of Present Illness:    HPI: [name] [name] is a 66 y.o. female with a past medical history of coronary artery disease, CHF with a reduced ejection fraction, chronic kidney disease, lung cancer, insulin-dependent diabetes mellitus type 2, hypertension, hyperlipidemia, history of right BKA, peripheral arterial disease, depression  This patient presents from the cancer center Dr. Collie Siad office with elevated LFTs, hypotension and tachycardia.  She denies any cough, fevers or chills.  She denies any urinary complaints.  No abdominal pain.  No nausea vomiting or diarrhea.  States that she has been drinking okay but has no appetite.  Hypotension responded to IV fluid resuscitation in the emergency department.  Tachycardia now resolved.  In the emergency department EKG was concerning for some ST elevation in leads III, aVF and the ER provider did speak with Dr. Saunders Revel felt this was not a STEMI and recommended IV fluids and a repeat EKG showed no ST elevations.  ED Course: Ultrasound showed no biliary obstruction.  Chest x-ray showed no infiltrates or consolidation consistent with pneumonia.  Creatinine 2.18 and AST 1230 and ALT 747, T. bili elevated 2.7.  Lactic acid normal.  INR unremarkable.  Initial troponin 24.  He has been given Solu-Medrol 50 mg IV x1.  He has also been given IV fluids with 1 L of normal saline as a bolus.    ROS:   14 point review of systems is negative except for what is mentioned above in the HPI.   Past Medical History:   Past Medical History:  Diagnosis Date   Anemia    Cancer ([place])    CHF (congestive heart failure) ([place])    02/16/20 HFrEF 20-25% in setting of acute DVT/PE, underlying CAD   CKD (chronic kidney disease)    Coronary artery disease    02/20/20: CTO pRCA with left-to-right collaterals,  50% mLAD, 80% OM1, medical therapy   Depression    Diabetes ([place])    Diabetes mellitus without complication ([place])    DVT (deep venous thrombosis) ([place]) 02/16/2020   RLE DVT proximal veins 02/16/20 Duplex   Hyperlipidemia    Hypertension    Myocardial infarction [facility]) 2009   Stress related per patient; NSTEMI 2012 100% RCA with left-to-right collaterals   PE (pulmonary thromboembolism) ([place]) 02/15/2020   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, RLE BKA   Peripheral artery disease ([facility])     Past Surgical History:   Past Surgical History:  Procedure Laterality Date   IR IMAGING GUIDED PORT INSERTION  07/27/2021   Right lower extremity BKA Right     Social History:   Social History   Socioeconomic History   Marital status: Married    Spouse name: Not on file   Number of children: Not on file   Years of education: Not on file   Highest education level: Not on file  Occupational History   Not on file  Tobacco Use   Smoking status: Former    Packs/day: 0.25    Types: Cigars, Cigarettes    Quit date: 01/17/2021    Years since quitting: 0.5   Smokeless tobacco: Never  Vaping Use   Vaping Use: Never used  Substance and Sexual Activity   Alcohol use: Never   Drug use: Not Currently   Sexual activity: Not Currently  Other Topics Concern   Not on file  Social History Narrative   Not on file   Social Determinants of Health   Financial Resource Strain: Not on file  Food Insecurity: Not on file  Transportation Needs: Not on file  Physical Activity: Not on file  Stress: Not on file  Social Connections: Not on file  Intimate Partner Violence: Not on file    Allergies:   Allergies  Allergen Reactions   Lisinopril Swelling and Other (See Comments)    Recurrent AKI and hyperkalemia when initiation attempted.    Family History:   Family History  Problem Relation Age of Onset   Heart disease Mother    Diabetes Mother    Heart disease Father    Diabetes  Father      Current Medications:   Prior to Admission medications   Medication Sig Start Date End Date Taking? Authorizing Provider  aspirin EC 81 MG tablet Take 81 mg by mouth in the morning. Swallow whole.   Yes [provider]  atorvastatin (LIPITOR) 80 MG tablet Take 1 tablet (80 mg total) by mouth daily. 02/10/21  Yes [name], MD  carboxymethylcellulose (REFRESH PLUS) 0.5 % SOLN Place 1 drop into both eyes in the morning and at bedtime.   Yes [provider]  diphenhydrAMINE (BENADRYL) 25 MG tablet Take 25 mg by mouth at bedtime.   Yes [provider]  DULoxetine (CYMBALTA) 20 MG capsule TAKE 2 CAPSULES (40 MG TOTAL) BY MOUTH IN THE MORNING AND AT BEDTIME. 03/04/21  Yes [name], MD  gabapentin (NEURONTIN) 100 MG capsule Take 1 capsule (100 mg total) by mouth 2 (two) times daily. 12/21/20  Yes [name], MD  HUMALOG KWIKPEN 100 UNIT/ML KwikPen Inject 8 Units into the skin 3 (three) times daily after meals. 01/04/21  Yes [provider]  isosorbide dinitrate (ISORDIL) 30 MG tablet TAKE 1 TABLET (30 MG TOTAL) BY MOUTH IN THE MORNING, AT NOON, AND AT BEDTIME. 06/25/21  Yes [name], MD  JARDIANCE 10 MG TABS tablet TAKE 1 TABLET BY MOUTH EVERY DAY 06/25/21  Yes [name], MD  ketoconazole (NIZORAL) 2 % cream APPLY 1 APPLICATION TOPICALLY IN THE MORNING AND AT BEDTIME. APPLIED TO FEET 07/25/21  Yes [name], MD  LANTUS SOLOSTAR 100 UNIT/ML Solostar Pen 16 Units at bedtime. 08/10/20  Yes [provider]  metFORMIN (GLUCOPHAGE-XR) 500 MG 24 hr tablet TAKE 1 TABLET BY MOUTH TWICE A DAY 07/03/21  Yes [name], MD  metoprolol succinate (TOPROL-XL) 50 MG 24 hr tablet Take 1 tablet (50 mg total) by mouth daily. Take with or immediately following a meal. 02/10/21  Yes [name], MD  Multiple Vitamin (MULTIVITAMIN WITH MINERALS) TABS tablet Take 1 tablet by mouth in the morning. Adults 50+   Yes [provider]   prochlorperazine (COMPAZINE) 10 MG tablet Take 1 tablet (10 mg total) by mouth every 6 (six) hours as needed (Nausea or vomiting). 02/17/21  Yes Earlie Server, MD  spironolactone (ALDACTONE) 25 MG tablet TAKE 1 TABLET (25 MG TOTAL) BY MOUTH DAILY. 07/15/21  Yes [name], MD  ACCU-CHEK GUIDE test strip USE UP TP 4 TIMES A DAY AS DIRECTED 01/04/21   [name], MD  Accu-Chek Softclix Lancets lancets SMARTSIG:Topical 1-4 Times Daily 11/22/20   [provider]  blood glucose meter kit and supplies KIT Dispense based on patient and insurance preference. Use up to four times  daily as directed. 11/22/20   [name], MD  docusate sodium (COLACE) 100 MG capsule Take 1 capsule (100 mg total) by mouth 2 (two) times daily. 02/17/21   Earlie Server, MD  dronabinol (MARINOL) 2.5 MG capsule Take 1 capsule (2.5 mg total) by mouth 2 (two) times daily before a meal. 07/28/21   Earlie Server, MD  lidocaine-prilocaine (EMLA) cream Apply 1 application topically as needed. Apply to port and cover with saran wrap 1-2 hours prior to port access 07/28/21   Earlie Server, MD  ondansetron (ZOFRAN) 8 MG tablet Take 1 tablet (8 mg total) by mouth 2 (two) times daily as needed (Nausea or vomiting). Start if needed on the third day after cisplatin. 07/28/21   Earlie Server, MD     Physical Exam:   Vitals:   08/02/21 1227 08/02/21 1228 08/02/21 1330 08/02/21 1419  BP: (!) 86/61  109/69 118/76  Pulse: (!) 107  100 94  Resp: _0 Temp: 98.1 F (36.7 C)     TempSrc: Oral     SpO2: 98%  99% 99%  Weight:  51.7 kg    Height:  _1  (1.651 m)       General: Thin frail and cachectic appearing, chronically ill-appearing Cardiovascular:  RRR, no m/r/g.  Respiratory:   CTA bilaterally with no wheezes/rales/rhonchi.  Normal respiratory effort. Abdomen:  soft, NT, ND, NABS Skin:  no rash or induration seen on limited exam Musculoskeletal:  grossly normal tone BUE/BLE, good ROM, no bony abnormality Lower extremity:  No LE edema.   Limited foot exam with no ulcerations.  2+ distal pulses. Psychiatric:  grossly normal mood and affect, speech fluent and appropriate, AOx3 Neurologic:  CN 2-12 grossly intact, moves all extremities in coordinated fashion, sensation intact    Data Review:    Radiological Exams on Admission: Independently reviewed - see discussion in A/P where applicable  US Venous Img Lower Bilateral  Result Date: 08/02/2021 CLINICAL DATA:  Shortness of breath EXAM: BILATERAL LOWER EXTREMITY VENOUS DOPPLER ULTRASOUND TECHNIQUE: Gray-scale sonography with graded compression, as well as color Doppler and duplex ultrasound were performed to evaluate the lower extremity deep venous systems from the level of the common femoral vein and including the common femoral, femoral, profunda femoral, popliteal and calf veins including the posterior tibial, peroneal and gastrocnemius veins when visible. The superficial great saphenous vein was also interrogated. Spectral Doppler was utilized to evaluate flow at rest and with distal augmentation maneuvers in the common femoral, femoral and popliteal veins. COMPARISON:  None. FINDINGS: RIGHT LOWER EXTREMITY Common Femoral Vein: No evidence of thrombus. Normal compressibility, respiratory phasicity and response to augmentation. Saphenofemoral Junction: No evidence of thrombus. Normal compressibility and flow on color Doppler imaging. Profunda Femoral Vein: No evidence of thrombus. Normal compressibility and flow on color Doppler imaging. Femoral Vein: No evidence of thrombus. Normal compressibility, respiratory phasicity and response to augmentation. Popliteal Vein: No evidence of thrombus. Normal compressibility, respiratory phasicity and response to augmentation. Calf Veins: Prior below-knee amputation. Superficial Great Saphenous Vein: No evidence of thrombus. Normal compressibility. Venous Reflux:  None. Other Findings:  None. LEFT LOWER EXTREMITY Common Femoral Vein: No evidence of  thrombus. Normal compressibility, respiratory phasicity and response to augmentation. Saphenofemoral Junction: No evidence of thrombus. Normal compressibility and flow on color Doppler imaging. Profunda Femoral Vein: No evidence of thrombus. Normal compressibility and flow on color Doppler imaging. Femoral Vein: No evidence of thrombus. Normal compressibility, respiratory phasicity and response to augmentation. Popliteal  Vein: No evidence of thrombus. Normal compressibility, respiratory phasicity and response to augmentation. Calf Veins: No evidence of thrombus. Normal compressibility and flow on color Doppler imaging. Superficial Great Saphenous Vein: No evidence of thrombus. Normal compressibility. Venous Reflux:  None. Other Findings:  None. IMPRESSION: No evidence of deep venous thrombosis in either lower extremity. Electronically Signed   By: Maurine Simmering M.D.   On: 08/02/2021 15:37   DG Chest Portable 1 View  Result Date: 08/02/2021 CLINICAL DATA:  A 66 year old female presents for evaluation of shortness of breath. EXAM: PORTABLE CHEST 1 VIEW COMPARISON:  April 26, 2021. FINDINGS: RIGHT IJ Port-A-Cath with tip over the distal superior vena cava. EKG leads project over the chest. LEFT hemidiaphragm remains elevated. Distortion of cardiomediastinal contours related to this hemidiaphragmatic elevation with similar appearance to prior imaging. Heart size and mediastinal contours are normal accounting for this finding. No lobar consolidation. No sign of effusion or pneumothorax. On limited assessment there is no acute skeletal process. IMPRESSION: Electronically Signed   By: Zetta Bills M.D.   On: 08/02/2021 14:52    EKG: Independently reviewed.  Repeat EKG sinus rate of 97 with no ST elevations   Labs on Admission: I have personally reviewed the available labs and imaging studies at the time of the admission.  Pertinent labs on Admission: Hemoglobin 9.6, hematocrit 29, sodium 134, bicarb 20, BUN  41, creatinine 2.8, blood glucose 140, AST 1230, ALT 747, albumin 3, T. bili 2.7, initial troponin 24, INR 1     Assessment/Plan:    Severe transaminitis: Probably immunotherapy related.  T. bili was elevated at 2.7.  Ultrasound on 2/9 that showed no biliary obstruction.  Hold patient's home Lipitor.  Consult oncology.  Continue Solu-Medrol 1 mg/kg daily.  Monitor LFTs daily  Acute kidney injury: Initial creatinine 2.1.  Baseline creatinine appears to be around 1.1-1.2.  Consider retroperitoneal ultrasound to rule out obstructive uropathy.  Start maintenance IV fluids.  Holding home Aldactone.  Hypovolemic shock: Responded to IV fluid resuscitation.  Continue maintenance IV fluids.  No clear source of infection.  Coronary artery disease: Continue home aspirin  Hypertension: Continue home Isordil and Toprol-XL  Insulin diabetes mellitus type 2: Continue home Lantus 16 units nightly and Humalog 8 units 3 times a day with meals.  Monitor for steroid-induced hyperglycemia.  Holding home [place] and Glucophage-XR.  Depression/anxiety: Continue home Cymbalta  CHF with a reduced ejection fraction: Not in acute decompensation.  Continue home Toprol-XL   Other information:    Level of Care: MedSurg DVT prophylaxis: Heparin subcu Code Status: Full code Consults: Oncology Admission status: Inpatient   Leslee Home DO [facility]   How to contact the [facility] Attending or Consulting provider [place] or covering provider during after hours [place], for this patient?  Check the care team in [facility] and look for a) attending/consulting [facility] provider listed and b) the [facility] team listed Log into [email or web address] and use [place]'s universal password to access. If you do not have the password, please contact the hospital operator. Locate the [facility] provider you are looking for under [facility] and page to a number that you can be directly reached. If you still have difficulty reaching the  provider, please page the [facility] (Director on Call) for the Hospitalists listed on amion for assistance.   08/02/2021, 4:42 PM

## 2021-08-03 ENCOUNTER — Inpatient Hospital Stay: Payer: Medicaid Other

## 2021-08-03 DIAGNOSIS — D649 Anemia, unspecified: Secondary | ICD-10-CM

## 2021-08-03 DIAGNOSIS — N185 Chronic kidney disease, stage 5: Secondary | ICD-10-CM | POA: Diagnosis present

## 2021-08-03 DIAGNOSIS — F329 Major depressive disorder, single episode, unspecified: Secondary | ICD-10-CM

## 2021-08-03 DIAGNOSIS — I10 Essential (primary) hypertension: Secondary | ICD-10-CM

## 2021-08-03 DIAGNOSIS — N1832 Chronic kidney disease, stage 3b: Secondary | ICD-10-CM | POA: Diagnosis not present

## 2021-08-03 DIAGNOSIS — R7401 Elevation of levels of liver transaminase levels: Secondary | ICD-10-CM | POA: Diagnosis not present

## 2021-08-03 DIAGNOSIS — Z9049 Acquired absence of other specified parts of digestive tract: Secondary | ICD-10-CM | POA: Diagnosis not present

## 2021-08-03 DIAGNOSIS — N179 Acute kidney failure, unspecified: Secondary | ICD-10-CM | POA: Diagnosis present

## 2021-08-03 DIAGNOSIS — I5020 Unspecified systolic (congestive) heart failure: Secondary | ICD-10-CM | POA: Diagnosis not present

## 2021-08-03 DIAGNOSIS — R571 Hypovolemic shock: Secondary | ICD-10-CM | POA: Diagnosis present

## 2021-08-03 DIAGNOSIS — R748 Abnormal levels of other serum enzymes: Secondary | ICD-10-CM | POA: Diagnosis not present

## 2021-08-03 DIAGNOSIS — F17201 Nicotine dependence, unspecified, in remission: Secondary | ICD-10-CM

## 2021-08-03 DIAGNOSIS — C3490 Malignant neoplasm of unspecified part of unspecified bronchus or lung: Secondary | ICD-10-CM

## 2021-08-03 DIAGNOSIS — E440 Moderate protein-calorie malnutrition: Secondary | ICD-10-CM

## 2021-08-03 DIAGNOSIS — E785 Hyperlipidemia, unspecified: Secondary | ICD-10-CM

## 2021-08-03 DIAGNOSIS — D509 Iron deficiency anemia, unspecified: Secondary | ICD-10-CM

## 2021-08-03 LAB — COMPREHENSIVE METABOLIC PANEL
ALT: 620 U/L — ABNORMAL HIGH (ref 0–44)
AST: 930 U/L — ABNORMAL HIGH (ref 15–41)
Albumin: 2.6 g/dL — ABNORMAL LOW (ref 3.5–5.0)
Alkaline Phosphatase: 895 U/L — ABNORMAL HIGH (ref 38–126)
Anion gap: 6 (ref 5–15)
BUN: 40 mg/dL — ABNORMAL HIGH (ref 8–23)
CO2: 20 mmol/L — ABNORMAL LOW (ref 22–32)
Calcium: 7.8 mg/dL — ABNORMAL LOW (ref 8.9–10.3)
Chloride: 112 mmol/L — ABNORMAL HIGH (ref 98–111)
Creatinine, Ser: 2.1 mg/dL — ABNORMAL HIGH (ref 0.44–1.00)
GFR, Estimated: 26 mL/min — ABNORMAL LOW (ref >60)
Glucose, Bld: 117 mg/dL — ABNORMAL HIGH (ref 70–99)
Potassium: 4.1 mmol/L (ref 3.5–5.1)
Sodium: 138 mmol/L (ref 135–145)
Total Bilirubin: 2.5 mg/dL — ABNORMAL HIGH (ref 0.3–1.2)
Total Protein: 5.4 g/dL — ABNORMAL LOW (ref 6.5–8.1)

## 2021-08-03 LAB — GLUCOSE, CAPILLARY
Glucose-Capillary: 91 mg/dL (ref 70–99)
Glucose-Capillary: 128 mg/dL — ABNORMAL HIGH (ref 70–99)
Glucose-Capillary: 172 mg/dL — ABNORMAL HIGH (ref 70–99)
Glucose-Capillary: 111 mg/dL — ABNORMAL HIGH (ref 70–99)

## 2021-08-03 LAB — PROTIME-INR
INR: 1 (ref 0.8–1.2)
Prothrombin Time: 13.1 seconds (ref 11.4–15.2)

## 2021-08-03 LAB — CBC
HCT: 24.2 % — ABNORMAL LOW (ref 36.0–46.0)
Hemoglobin: 7.9 g/dL — ABNORMAL LOW (ref 12.0–15.0)
MCH: 31 pg (ref 26.0–34.0)
MCHC: 32.6 g/dL (ref 30.0–36.0)
MCV: 94.9 fL (ref 80.0–100.0)
Platelets: 256 10*3/uL (ref 150–400)
RBC: 2.55 MIL/uL — ABNORMAL LOW (ref 3.87–5.11)
RDW: 15.5 % (ref 11.5–15.5)
WBC: 4.2 10*3/uL (ref 4.0–10.5)
nRBC: 0 % (ref 0.0–0.2)

## 2021-08-03 LAB — APTT: aPTT: 46 seconds — ABNORMAL HIGH (ref 24–36)

## 2021-08-03 MED ORDER — METHYLPREDNISOLONE SODIUM SUCC 125 MG IJ SOLR
50.0000 mg | Freq: Two times a day (BID) | INTRAMUSCULAR | Status: AC
  Administered 2021-08-03 – 2021-08-07 (×9): 50 mg via INTRAVENOUS
  Filled 2021-08-03 (×9): qty 2

## 2021-08-03 NOTE — Assessment & Plan Note (Addendum)
-   Patient noted to be in hypovolemic shock on admission which responded to IV fluids. -BP improved. -Patient resumed back on home regimen of Isordil, Toprol-XL, Aldactone.   -Outpatient follow-up.

## 2021-08-03 NOTE — Assessment & Plan Note (Addendum)
-   Creatinine elevated from baseline. -Renal function improved with hydration.   -Outpatient follow-up with PCP.

## 2021-08-03 NOTE — Assessment & Plan Note (Addendum)
-   Hemoglobin remained stable at 8.3 by day of discharge.   -Patient with no signs of bleeding.   -Outpatient follow-up.

## 2021-08-03 NOTE — Assessment & Plan Note (Addendum)
-   Hemoglobin A1c 7.8 (01/25/2021) -Repeat hemoglobin A1c 6.1 (08/04/2021). -Patient maintained on Semglee and dose increased to 24 units daily as well as 10 units NovoLog meal coverage as well as sliding scale insulin during the hospitalization for management of patient's CBGs which were elevated secondary to steroid treatment.   -Patient's blood sugars were controlled during the hospitalization patient was discharged home on Lantus 20 units daily, continuation of home regimen of NovoLog 8 units 3 times daily with meals in addition to resumption of oral hypoglycemic agents.   -Outpatient follow-up with PCP.

## 2021-08-03 NOTE — Assessment & Plan Note (Addendum)
-   Statin held during the hospitalization and discontinued on discharge.  -Outpatient follow-up with oncology, PCP who will determine when statin may be resumed.

## 2021-08-03 NOTE — Assessment & Plan Note (Addendum)
-   Hypovolemic shock felt likely secondary to volume depletion. -Blood pressure responded with IV fluids.   -No source of infection noted.  -Blood pressure improved and hypovolemic shock had resolved by day of discharge.

## 2021-08-03 NOTE — Consult Note (Signed)
Hematology/Oncology Consult note [phone] [phone]      Patient Care Team: [name], MD as PCP - General (Family Medicine) Earlie Server, MD as Consulting Physician (Hematology and Oncology)   Name of the patient: [name] [name]  [id number]  12/18/1955   Date of visit: 08/03/21 REASON FOR COSULTATION:  Transaminitis due immunotherapy History of presenting illness-  66 y.o. female with history of lung cancer s/p surgical resection, adjuvant chemotherapy, s/p 1 dose of immunotherapy was advised to go to ER for evaluation of abnormal labs, low BP, decreased oral intake.   Patient was seen by me yesterday in the clinic. She received one dose of Tecentriq on 07/07/2021. She was found to have elevated LFT and immunotherapy was held. She presented for repeating blood work and was found to be hypotensive in the clinic.  Patient reports nauseated and decreased oral intake since last week.  No fever or chills.  Denies any abdominal pain.  She took all her blood pressure medication in the morning. Her blood work also showed AKI.  Patient was advised to go to emergency room and I spoke with ER physician.  Patient was admitted for transaminitis secondary to immunotherapy.  She has been started on Solu-Medrol 1 mg/kilogram.  She has also been given IV hydration.  Today patient reports feeling better.  Blood pressure has improved.  No further nausea, appetite has improved.  Review of Systems  Constitutional:  Positive for appetite change and fatigue. Negative for chills and fever.  HENT:   Negative for hearing loss and voice change.   Eyes:  Negative for eye problems.  Respiratory:  Negative for chest tightness and cough.   Cardiovascular:  Negative for chest pain.  Gastrointestinal:  Positive for nausea. Negative for abdominal distention, abdominal pain and blood in stool.  Endocrine: Negative for hot flashes.  Genitourinary:  Negative for difficulty urinating and  frequency.   Musculoskeletal:  Negative for arthralgias.  Skin:  Negative for itching and rash.  Neurological:  Negative for extremity weakness.  Hematological:  Negative for adenopathy.  Psychiatric/Behavioral:  Negative for confusion.    Allergies  Allergen Reactions   Lisinopril Swelling and Other (See Comments)    Recurrent AKI and hyperkalemia when initiation attempted.    Patient Active Problem List   Diagnosis Date Noted   Transaminitis 08/02/2021   Chemotherapy-induced nausea 07/28/2021   Encounter for antineoplastic immunotherapy 07/07/2021   Macrocytic anemia 07/07/2021   Moderate protein-calorie malnutrition ([place]) 04/12/2021   Stage 3b chronic kidney disease ([place]) 04/12/2021   Normocytic anemia 04/12/2021   Dyslipidemia 04/05/2021   Encounter for antineoplastic chemotherapy 02/28/2021   Non-small cell cancer of left lung ([place]) 02/17/2021   Tinea pedis of both feet 02/10/2021   Chronic pain due to neoplasm 02/10/2021   Adenocarcinoma, lung, left ([place]) 01/19/2021   S/P Robotic Assisted Video Thoracoscopy with Left Lower Lobectomy Lung, Intercostal nerve block, lymph node dissection 01/17/2021   Non-small cell lung cancer ([place]) 12/30/2020   Depression 12/09/2020   Hyperlipidemia 12/09/2020   Primary hypertension 12/09/2020   Heart failure with reduced ejection fraction ([place]) 02/16/2020   Multiple subsegmental pulmonary emboli without acute cor pulmonale ([place]) 02/15/2020   Iron deficiency anemia 12/18/2019   Rectal pain 12/18/2019   Diabetic foot ulcer ([place]) 10/03/2019   Cellulitis 08/17/2019   Cocaine use 08/17/2019   Peripheral vascular disease ([place]) 08/17/2019   Tobacco use disorder 08/17/2019   Tobacco abuse, in remission 08/17/2019   Cervical dysplasia 04/02/2019  CAD (coronary artery disease), native coronary artery 06/06/2010   Type II diabetes mellitus ([place]) 06/06/2010   Acute subendocardial infarction, initial episode of care [facility]) 06/05/2010      Past Medical History:  Diagnosis Date   Anemia    Cancer ([place])    CHF (congestive heart failure) ([place])    02/16/20 HFrEF 20-25% in setting of acute DVT/PE, underlying CAD   CKD (chronic kidney disease)    Coronary artery disease    02/20/20: CTO pRCA with left-to-right collaterals, 50% mLAD, 80% OM1, medical therapy   Depression    Diabetes ([place])    Diabetes mellitus without complication ([place])    DVT (deep venous thrombosis) ([place]) 02/16/2020   RLE DVT proximal veins 02/16/20 Duplex   Hyperlipidemia    Hypertension    Myocardial infarction [facility]) 2009   Stress related per patient; NSTEMI 2012 100% RCA with left-to-right collaterals   PE (pulmonary thromboembolism) ([place]) 02/15/2020   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, RLE BKA   Peripheral artery disease ([facility])      Past Surgical History:  Procedure Laterality Date   IR IMAGING GUIDED PORT INSERTION  07/27/2021   Right lower extremity BKA Right     Social History   Socioeconomic History   Marital status: Married    Spouse name: Not on file   Number of children: Not on file   Years of education: Not on file   Highest education level: Not on file  Occupational History   Not on file  Tobacco Use   Smoking status: Former    Packs/day: 0.25    Types: Cigars, Cigarettes    Quit date: 01/17/2021    Years since quitting: 0.5   Smokeless tobacco: Never  Vaping Use   Vaping Use: Never used  Substance and Sexual Activity   Alcohol use: Never   Drug use: Not Currently   Sexual activity: Not Currently  Other Topics Concern   Not on file  Social History Narrative   Not on file   Social Determinants of Health   Financial Resource Strain: Not on file  Food Insecurity: Not on file  Transportation Needs: Not on file  Physical Activity: Not on file  Stress: Not on file  Social Connections: Not on file  Intimate Partner Violence: Not on file     Family History  Problem Relation Age of Onset   Heart  disease Mother    Diabetes Mother    Heart disease Father    Diabetes Father      Current Facility-Administered Medications:    0.9 %  sodium chloride infusion, , Intravenous, Continuous, Eugenie Filler, MD, Last Rate: 100 mL/hr at 08/03/21 0751, Rate Change at 08/03/21 0751   aspirin EC tablet 81 mg, 81 mg, Oral, q AM, Anwar, Shayan S, DO, 81 mg at 08/03/21 8676   diphenhydrAMINE (BENADRYL) capsule 25 mg, 25 mg, Oral, QHS, Anwar, Shayan S, DO, 25 mg at 08/02/21 2058   dronabinol (MARINOL) capsule 2.5 mg, 2.5 mg, Oral, BID AC, Anwar, Shayan S, DO, 2.5 mg at 08/03/21 0913   DULoxetine (CYMBALTA) DR capsule 40 mg, 40 mg, Oral, BID, Anwar, Shayan S, DO, 40 mg at 08/03/21 0913   gabapentin (NEURONTIN) capsule 100 mg, 100 mg, Oral, BID, Anwar, Shayan S, DO, 100 mg at 08/03/21 0913   heparin injection 5,000 Units, 5,000 Units, Subcutaneous, Q8H, Anwar, Shayan S, DO, 5,000 Units at 08/03/21 0523   insulin aspart (novoLOG) injection 8 Units, 8  Units, Subcutaneous, TID PC, Leslee Home, DO, 8 Units at 08/02/21 2058   insulin glargine-yfgn (SEMGLEE) injection 16 Units, 16 Units, Subcutaneous, Q2200, Leslee Home, DO, 16 Units at 08/02/21 2057   isosorbide dinitrate (ISORDIL) tablet 30 mg, 30 mg, Oral, TID, Anwar, Shayan S, DO, 30 mg at 08/03/21 0913   methylPREDNISolone sodium succinate (SOLU-MEDROL) 125 mg/2 mL injection 50 mg, 50 mg, Intravenous, Q24H, Anwar, Shayan S, DO, 50 mg at 08/03/21 0913   metoprolol succinate (TOPROL-XL) 24 hr tablet 50 mg, 50 mg, Oral, Daily, Anwar, Shayan S, DO, 50 mg at 08/03/21 0913   multivitamin with minerals tablet 1 tablet, 1 tablet, Oral, q AM, Imagene Sheller S, DO, 1 tablet at 08/03/21 0913   ondansetron (ZOFRAN) tablet 4 mg, 4 mg, Oral, Q6H PRN **OR** ondansetron (ZOFRAN) injection 4 mg, 4 mg, Intravenous, Q6H PRN, Rowe Pavy, Shayan S, DO   polyvinyl alcohol (LIQUIFILM TEARS) 1.4 % ophthalmic solution 1 drop, 1 drop, Both Eyes, PRN, Benita Gutter, RPH    prochlorperazine (COMPAZINE) tablet 10 mg, 10 mg, Oral, Q6H PRN, Leslee Home, DO   Physical exam:  Vitals:   08/02/21 1821 08/03/21 0134 08/03/21 0551 08/03/21 0846  BP: 130/79 99/82 130/70 140/83  Pulse: 97 98 91 92  Resp: 18 16 16 20   Temp: 98.1 F (36.7 C) 98.1 F (36.7 C) 98.1 F (36.7 C) 97.9 F (36.6 C)  TempSrc:  Oral Oral Oral  SpO2: 100% 100% 99% 100%  Weight:      Height:       Physical Exam Constitutional:      General: She is not in acute distress.    Appearance: She is not diaphoretic.  HENT:     Head: Normocephalic and atraumatic.     Nose: Nose normal.     Mouth/Throat:     Pharynx: No oropharyngeal exudate.  Eyes:     General: No scleral icterus.    Pupils: Pupils are equal, round, and reactive to light.  Cardiovascular:     Rate and Rhythm: Normal rate.     Heart sounds: No murmur heard. Pulmonary:     Effort: Pulmonary effort is normal. No respiratory distress.  Abdominal:     General: There is no distension.     Palpations: Abdomen is soft.  Musculoskeletal:        General: Normal range of motion.     Cervical back: Normal range of motion and neck supple.     Comments: Right lower extremity BKA  Skin:    General: Skin is warm and dry.     Findings: No erythema.  Neurological:     Mental Status: She is alert and oriented to person, place, and time. Mental status is at baseline.     Motor: No abnormal muscle tone.  Psychiatric:        Mood and Affect: Affect normal.        CMP Latest Ref Rng & Units 08/03/2021  Glucose 70 - 99 mg/dL 117(H)  BUN 8 - 23 mg/dL 40(H)  Creatinine 0.44 - 1.00 mg/dL 2.10(H)  Sodium 135 - 145 mmol/L 138  Potassium 3.5 - 5.1 mmol/L 4.1  Chloride 98 - 111 mmol/L 112(H)  CO2 22 - 32 mmol/L 20(L)  Calcium 8.9 - 10.3 mg/dL 7.8(L)  Total Protein 6.5 - 8.1 g/dL 5.4(L)  Total Bilirubin 0.3 - 1.2 mg/dL 2.5(H)  Alkaline Phos 38 - 126 U/L 895(H)  AST 15 - 41 U/L 930(H)  ALT 0 -  44 U/L 620(H)   CBC Latest Ref Rng  & Units 08/03/2021  WBC 4.0 - 10.5 K/uL 4.2  Hemoglobin 12.0 - 15.0 g/dL 7.9(L)  Hematocrit 36.0 - 46.0 % 24.2(L)  Platelets 150 - 400 K/uL 256    RADIOGRAPHIC STUDIES: I have personally reviewed the radiological images as listed and agreed with the findings in the report. US Venous Img Lower Bilateral  Result Date: 08/02/2021 CLINICAL DATA:  Shortness of breath EXAM: BILATERAL LOWER EXTREMITY VENOUS DOPPLER ULTRASOUND TECHNIQUE: Gray-scale sonography with graded compression, as well as color Doppler and duplex ultrasound were performed to evaluate the lower extremity deep venous systems from the level of the common femoral vein and including the common femoral, femoral, profunda femoral, popliteal and calf veins including the posterior tibial, peroneal and gastrocnemius veins when visible. The superficial great saphenous vein was also interrogated. Spectral Doppler was utilized to evaluate flow at rest and with distal augmentation maneuvers in the common femoral, femoral and popliteal veins. COMPARISON:  None. FINDINGS: RIGHT LOWER EXTREMITY Common Femoral Vein: No evidence of thrombus. Normal compressibility, respiratory phasicity and response to augmentation. Saphenofemoral Junction: No evidence of thrombus. Normal compressibility and flow on color Doppler imaging. Profunda Femoral Vein: No evidence of thrombus. Normal compressibility and flow on color Doppler imaging. Femoral Vein: No evidence of thrombus. Normal compressibility, respiratory phasicity and response to augmentation. Popliteal Vein: No evidence of thrombus. Normal compressibility, respiratory phasicity and response to augmentation. Calf Veins: Prior below-knee amputation. Superficial Great Saphenous Vein: No evidence of thrombus. Normal compressibility. Venous Reflux:  None. Other Findings:  None. LEFT LOWER EXTREMITY Common Femoral Vein: No evidence of thrombus. Normal compressibility, respiratory phasicity and response to augmentation.  Saphenofemoral Junction: No evidence of thrombus. Normal compressibility and flow on color Doppler imaging. Profunda Femoral Vein: No evidence of thrombus. Normal compressibility and flow on color Doppler imaging. Femoral Vein: No evidence of thrombus. Normal compressibility, respiratory phasicity and response to augmentation. Popliteal Vein: No evidence of thrombus. Normal compressibility, respiratory phasicity and response to augmentation. Calf Veins: No evidence of thrombus. Normal compressibility and flow on color Doppler imaging. Superficial Great Saphenous Vein: No evidence of thrombus. Normal compressibility. Venous Reflux:  None. Other Findings:  None. IMPRESSION: No evidence of deep venous thrombosis in either lower extremity. Electronically Signed   By: Maurine Simmering M.D.   On: 08/02/2021 15:37   DG Chest Portable 1 View  Result Date: 08/02/2021 CLINICAL DATA:  A 66 year old female presents for evaluation of shortness of breath. EXAM: PORTABLE CHEST 1 VIEW COMPARISON:  April 26, 2021. FINDINGS: RIGHT IJ Port-A-Cath with tip over the distal superior vena cava. EKG leads project over the chest. LEFT hemidiaphragm remains elevated. Distortion of cardiomediastinal contours related to this hemidiaphragmatic elevation with similar appearance to prior imaging. Heart size and mediastinal contours are normal accounting for this finding. No lobar consolidation. No sign of effusion or pneumothorax. On limited assessment there is no acute skeletal process. IMPRESSION: Electronically Signed   By: Zetta Bills M.D.   On: 08/02/2021 14:52   IR IMAGING GUIDED PORT INSERTION  Result Date: 07/27/2021 INDICATION: History of lung cancer EXAM: Ultrasound-guided puncture of the right internal jugular vein Placement of a right-sided chest port using fluoroscopic guidance MEDICATIONS: None ANESTHESIA/SEDATION: Moderate (conscious) sedation was employed during this procedure. A total of Versed 1 mg and Fentanyl 50 mcg was  administered intravenously. Moderate Sedation Time: 20 minutes. The patient's level of consciousness and vital signs were monitored continuously by radiology nursing throughout the procedure  under my direct supervision. FLUOROSCOPY TIME:  Fluoroscopy Time: 0.3 minutes (2.5 mGy) COMPLICATIONS: None immediate. PROCEDURE: Informed written consent was obtained from the patient after a thorough discussion of the procedural risks, benefits and alternatives. All questions were addressed. Maximal Sterile Barrier Technique was utilized including caps, mask, sterile gowns, sterile gloves, sterile drape, hand hygiene and skin antiseptic. A timeout was performed prior to the initiation of the procedure. The patient was placed supine on the exam table. The right neck and chest was prepped and draped in the standard sterile fashion. A preliminary ultrasound of the right neck was performed and demonstrates a patent right internal jugular vein. A permanent ultrasound image was stored in the electronic medical record. The overlying skin was anesthetized with 1% Lidocaine. Using ultrasound guidance, access was obtained into the right internal jugular vein using a 21 gauge micropuncture set. A wire was advanced into the SVC, a short incision was made at the puncture site, and serial dilatation performed. Next, in an ipsilateral infraclavicular location, an incision was made at the site of the subcutaneous reservoir. Blunt dissection was used to open a pocket to contain the reservoir. A subcutaneous tunnel was then created from the port site to the puncture site. A(n) 8 Fr single lumen catheter was advanced through the tunnel. The catheter was attached to the port and this was placed in the subcutaneous pocket. Under fluoroscopic guidance, a peel away sheath was placed, and the catheter was trimmed to the appropriate length and was advanced into the central veins. The catheter length is 22 cm. The tip of the catheter lies near the  superior cavoatrial junction. The port flushes and aspirates appropriately. The port was flushed and locked with heparinized saline. The port pocket was closed in 2 layers using 3-0 and 4-0 Vicryl/absorbable suture. Dermabond was also applied to both incisions. The patient tolerated the procedure well and was transferred to recovery in stable condition. IMPRESSION: Successful placement of a right chest port via the right internal jugular vein. The port is ready for immediate use. Electronically Signed   By: Albin Felling M.D.   On: 07/27/2021 12:30   US Abdomen Limited RUQ (LIVER/GB)  Result Date: 08/03/2021 CLINICAL DATA:  Transaminitis EXAM: ULTRASOUND ABDOMEN LIMITED RIGHT UPPER QUADRANT COMPARISON:  07/28/2021 FINDINGS: Gallbladder: Status post cholecystectomy Common bile duct: Diameter: 3 mm Liver: No focal lesion identified. Within normal limits in parenchymal echogenicity. Portal vein is patent on color Doppler imaging with normal direction of blood flow towards the liver. Other: None. IMPRESSION: Normal sonographic appearance of the liver. Electronically Signed   By: Miachel Roux M.D.   On: 08/03/2021 08:37   US ABDOMEN LIMITED RUQ (LIVER/GB)  Result Date: 07/28/2021 CLINICAL DATA:  Elevated liver enzymes. EXAM: ULTRASOUND ABDOMEN LIMITED RIGHT UPPER QUADRANT COMPARISON:  November 17, 2020. FINDINGS: Gallbladder: Patient is post cholecystectomy as evidenced on prior CT imaging. Common bile duct: Diameter: 5.4 mm Liver: No focal lesion identified. Within normal limits in parenchymal echogenicity. Portal vein is patent on color Doppler imaging with normal direction of blood flow towards the liver. Other: None. IMPRESSION: Post cholecystectomy without signs of biliary duct distension. No parenchymal abnormalities noted in the liver. Electronically Signed   By: Zetta Bills M.D.   On: 07/28/2021 15:13    Assessment and plan-   #Acute transaminitis, hyperbilirubinemia This is likely secondary to  immunotherapy, Grade 4 toxicity Transaminitis and bilirubin have improved slightly, still high.  Increase steroid to 1 mg/kilogram Solu-Medrol BID.  Trend LFT daily.  Both transaminitis and bilirubin have improved. Once her transaminitis levels improved to be less than 5 x upper normal limits { AST<200, ALT < 220], and if Bilirubin <2,  ok to transition to prednisone and she will need slow taper outpatient.   #Hypovolemic shock, secondary to poor oral intake. Continue IV fluid for hydration.  #AKI, secondary to poor oral intake. Diuretics has been held.  Continue IV hydration.  #Normocytic anemia, likely due to chronic kidney disease.  Check iron panel.  #Non-small lung cancer, status post resection and adjuvant chemotherapy. Discontinue adjuvant immunotherapy due to grade 4 liver toxicity.     Thank you for allowing me to participate in the care of this patient.   Earlie Server, MD, PhD Hematology Oncology 5 08/03/2021

## 2021-08-03 NOTE — Assessment & Plan Note (Addendum)
-   Tobacco cessation.

## 2021-08-03 NOTE — Assessment & Plan Note (Signed)
-   Secondary to non-small cell lung cancer. -Nutritional supplementation.

## 2021-08-03 NOTE — Assessment & Plan Note (Signed)
-   Being followed by oncology.   -Outpatient follow-up with oncology -

## 2021-08-03 NOTE — Assessment & Plan Note (Addendum)
-   Stable. -Patient maintained on home regimen Toprol-XL, Isordil, Aldactone.

## 2021-08-03 NOTE — Progress Notes (Signed)
PROGRESS NOTE    [name] [name]  BTD:[id number] DOB: 05-Jun-1956 DOA: 08/02/2021 PCP: [name], MD    Chief Complaint  Patient presents with   Hypotension    Brief Narrative:  Patient is a pleasant unfortunate 66 year old female history of CAD, CHF, CKD, lung cancer, insulin-dependent type 2 diabetes, hypertension hyperlipidemia history of right BKA, PAD, depression presented from oncologist office due to transaminitis, hypotension, tachycardia, poor oral intake.  Initial EKG concerning for ST elevations in leads III, aVF ED provider spoke with cardiology who felt not a STEMI recommended IV fluids with repeat EKG showing resolution of ST elevation.  Patient noted with lung cancer status postsurgical resection, adjuvant chemotherapy, status post 1 dose immunotherapy.  Concern patient's immunotherapy may have led to severe transaminitis.  Oncology following and recommended IV Solu-Medrol which patient was started on.    Assessment & Plan:   Assessment and Plan: * Transaminitis- (present on admission) - Felt likely secondary to immunotherapy. -Patient noted to have had ultrasound done 07/28/2021 with no biliary obstruction. -Repeat right upper quadrant ultrasound. -Continue to hold statin. -Oncology consulted recommended Solu-Medrol 1 mg/kg daily which patient was started on. -LFTs trending down. -Oncology consulted and following. -Supportive care.  Hypovolemic shock ([place])- (present on admission) - Hypovolemic shock felt likely secondary to volume depletion. -Blood pressure responded to IV fluids. -No source of infection noted. -Continue IV fluids.  AKI (acute kidney injury) ([place])- (present on admission) - Secondary to prerenal azotemia secondary to volume depletion in the setting of hypovolemic shock. -Aldactone held. -Blood pressure improved with hydration. -Urinalysis with trace leukocytes, nitrite negative, 0-5 WBCs. -Continue IV fluids and if no improvement in the next  24 hours we will check a renal ultrasound..  Normocytic anemia- (present on admission) - Hemoglobin stable at 7.9 -Follow H&H. -No signs of bleeding -Transfusion threshold hemoglobin < 7.   Stage 3b chronic kidney disease ([place])- (present on admission) - Creatinine elevated from baseline. -See AKI.  Moderate protein-calorie malnutrition ([place])- (present on admission) - Secondary to non-small cell lung cancer. -Nutritional supplementation.  Non-small cell lung cancer ([place])- (present on admission) - Being followed by oncology.   -Outpatient follow-up with oncology -  Primary hypertension- (present on admission) - Patient noted to be in hypovolemic shock on admission which responded to IV fluids. -BP improved. -Monitor blood pressure on Isordil and Toprol-XL.  Hyperlipidemia- (present on admission) - Continue to hold statin secondary to transaminitis  Depression- (present on admission) - Continue Cymbalta.  Type II diabetes mellitus ([facility]) - Hemoglobin A1c 7.8 (01/25/2021) -CBG 91 this morning. -Continue Semglee 16 units daily, NovoLog 8 units 3 times daily with meals, SSI. -Outpatient follow-up  Tobacco use disorder- (present on admission) - Tobacco cessation.  Heart failure with reduced ejection fraction ([place])- (present on admission) - Stable. -Continue Isordil, Toprol-XL         DVT prophylaxis: Heparin Code Status: Full Family Communication: Updated patient, no family at bedside. Disposition:   Status is: Inpatient Remains inpatient appropriate because: Severity of illness           Consultants:  Oncology: Dr.Yu  Procedures:  Right upper quadrant ultrasound 08/03/2021 Chest x-ray 08/02/2021 Lower extremity Dopplers 08/02/2021    Antimicrobials:  None   Subjective: -Patient overall feeling better than on admission.  No dizziness, no lightheadedness.  No chest pain, no shortness of breath.  Tolerating current diet.  Objective: Vitals:    08/02/21 1821 08/03/21 0134 08/03/21 0551 08/03/21 0846  BP: 130/79 99/82 130/70 140/83  Pulse: 97 98 91 92  Resp: 18 16 16 20   Temp: 98.1 F (36.7 C) 98.1 F (36.7 C) 98.1 F (36.7 C) 97.9 F (36.6 C)  TempSrc:  Oral Oral Oral  SpO2: 100% 100% 99% 100%  Weight:      Height:        Intake/Output Summary (Last 24 hours) at 08/03/2021 1542 Last data filed at 08/03/2021 1410 Gross per 24 hour  Intake 1467.99 ml  Output --  Net 1467.99 ml   Filed Weights   08/02/21 1228  Weight: 51.7 kg    Examination:  General exam: Appears calm and comfortable  Respiratory system: Clear to auscultation. Respiratory effort normal. Cardiovascular system: S1 & S2 heard, RRR. No JVD, murmurs, rubs, gallops or clicks. No pedal edema. Gastrointestinal system: Abdomen is nondistended, soft and nontender. No organomegaly or masses felt. Normal bowel sounds heard. Central nervous system: Alert and oriented. No focal neurological deficits. Extremities: Symmetric 5 x 5 power.  Status post right BKA. Skin: No rashes, lesions or ulcers Psychiatry: Judgement and insight appear normal. Mood & affect appropriate.     Data Reviewed:   CBC: Recent Labs  Lab 07/28/21 0855 08/02/21 1108 08/03/21 0430  WBC 5.6 4.7 4.2  NEUTROABS 4.3 3.1  --   HGB 9.1* 9.6* 7.9*  HCT 27.9* 29.4* 24.2*  MCV 98.6 95.5 94.9  PLT 273 285 161    Basic Metabolic Panel: Recent Labs  Lab 07/28/21 0855 08/02/21 1108 08/03/21 0430  NA 132* 134* 138  K 3.9 3.9 4.1  CL 100 102 112*  CO2 21* 20* 20*  GLUCOSE 175* 140* 117*  BUN 52* 41* 40*  CREATININE 2.13* 2.18* 2.10*  CALCIUM 8.4* 8.7* 7.8*    GFR: Estimated Creatinine Clearance: 22.4 mL/min (A) (by C-G formula based on SCr of 2.1 mg/dL (H)).  Liver Function Tests: Recent Labs  Lab 07/28/21 0855 08/02/21 1106 08/02/21 1108 08/03/21 0430  AST 987* 1,220* 1,230* 930*  ALT 707* 777* 747* 620*  ALKPHOS 601*  --  983* 895*  BILITOT 1.4* 2.6* 2.7* 2.5*   PROT 6.5 6.5 6.5 5.4*  ALBUMIN 3.0* 3.2* 3.0* 2.6*    CBG: Recent Labs  Lab 08/02/21 1825 08/02/21 2022 08/03/21 0847 08/03/21 1158  GLUCAP 121* 164* 91 128*     Recent Results (from the past 240 hour(s))  Resp Panel by RT-PCR (Flu A&B, Covid) Nasopharyngeal Swab     Status: None   Collection Time: 07/28/21  3:14 PM   Specimen: Nasopharyngeal Swab; Nasopharyngeal(NP) swabs in vial transport medium  Result Value Ref Range Status   SARS Coronavirus 2 by RT PCR NEGATIVE NEGATIVE Final    Comment: (NOTE) SARS-CoV-2 target nucleic acids are NOT DETECTED.  The SARS-CoV-2 RNA is generally detectable in upper respiratory specimens during the acute phase of infection. The lowest concentration of SARS-CoV-2 viral copies this assay can detect is 138 copies/mL. A negative result does not preclude SARS-Cov-2 infection and should not be used as the sole basis for treatment or other patient management decisions. A negative result may occur with  improper specimen collection/handling, submission of specimen other than nasopharyngeal swab, presence of viral mutation(s) within the areas targeted by this assay, and inadequate number of viral copies(<138 copies/mL). A negative result must be combined with clinical observations, patient history, and epidemiological information. The expected result is Negative.  Fact Sheet for Patients:  EntrepreneurPulse.com.au  Fact Sheet for Healthcare Providers:  IncredibleEmployment.be  This test is no t yet approved or  cleared by the [place] and  has been authorized for detection and/or diagnosis of SARS-CoV-2 by FDA under an Emergency Use Authorization (EUA). This EUA will remain  in effect (meaning this test can be used) for the duration of the COVID-19 declaration under Section 564(b)(1) of the Act, 21 U.S.C.section 360bbb-3(b)(1), unless the authorization is terminated  or revoked sooner.        Influenza A by PCR NEGATIVE NEGATIVE Final   Influenza B by PCR NEGATIVE NEGATIVE Final    Comment: (NOTE) The Xpert Xpress SARS-CoV-2/FLU/RSV plus assay is intended as an aid in the diagnosis of influenza from Nasopharyngeal swab specimens and should not be used as a sole basis for treatment. Nasal washings and aspirates are unacceptable for Xpert Xpress SARS-CoV-2/FLU/RSV testing.  Fact Sheet for Patients: EntrepreneurPulse.com.au  Fact Sheet for Healthcare Providers: IncredibleEmployment.be  This test is not yet approved or cleared by the [place] FDA and has been authorized for detection and/or diagnosis of SARS-CoV-2 by FDA under an Emergency Use Authorization (EUA). This EUA will remain in effect (meaning this test can be used) for the duration of the COVID-19 declaration under Section 564(b)(1) of the Act, 21 U.S.C. section 360bbb-3(b)(1), unless the authorization is terminated or revoked.  Performed at [facility], [address] [place], [place] [address]   Resp Panel by RT-PCR (Flu A&B, Covid) Nasopharyngeal Swab     Status: None   Collection Time: 08/02/21  1:41 PM   Specimen: Nasopharyngeal Swab; Nasopharyngeal(NP) swabs in vial transport medium  Result Value Ref Range Status   SARS Coronavirus 2 by RT PCR NEGATIVE NEGATIVE Final    Comment: (NOTE) SARS-CoV-2 target nucleic acids are NOT DETECTED.  The SARS-CoV-2 RNA is generally detectable in upper respiratory specimens during the acute phase of infection. The lowest concentration of SARS-CoV-2 viral copies this assay can detect is 138 copies/mL. A negative result does not preclude SARS-Cov-2 infection and should not be used as the sole basis for treatment or other patient management decisions. A negative result may occur with  improper specimen collection/handling, submission of specimen other than nasopharyngeal swab, presence of viral mutation(s) within  the areas targeted by this assay, and inadequate number of viral copies(<138 copies/mL). A negative result must be combined with clinical observations, patient history, and epidemiological information. The expected result is Negative.  Fact Sheet for Patients:  EntrepreneurPulse.com.au  Fact Sheet for Healthcare Providers:  IncredibleEmployment.be  This test is no t yet approved or cleared by the [place] FDA and  has been authorized for detection and/or diagnosis of SARS-CoV-2 by FDA under an Emergency Use Authorization (EUA). This EUA will remain  in effect (meaning this test can be used) for the duration of the COVID-19 declaration under Section 564(b)(1) of the Act, 21 U.S.C.section 360bbb-3(b)(1), unless the authorization is terminated  or revoked sooner.       Influenza A by PCR NEGATIVE NEGATIVE Final   Influenza B by PCR NEGATIVE NEGATIVE Final    Comment: (NOTE) The Xpert Xpress SARS-CoV-2/FLU/RSV plus assay is intended as an aid in the diagnosis of influenza from Nasopharyngeal swab specimens and should not be used as a sole basis for treatment. Nasal washings and aspirates are unacceptable for Xpert Xpress SARS-CoV-2/FLU/RSV testing.  Fact Sheet for Patients: EntrepreneurPulse.com.au  Fact Sheet for Healthcare Providers: IncredibleEmployment.be  This test is not yet approved or cleared by the [place] FDA and has been authorized for detection and/or diagnosis of SARS-CoV-2 by FDA under an Emergency  Use Authorization (EUA). This EUA will remain in effect (meaning this test can be used) for the duration of the COVID-19 declaration under Section 564(b)(1) of the Act, 21 U.S.C. section 360bbb-3(b)(1), unless the authorization is terminated or revoked.  Performed at [facility], [address] [place], [place] [address]   Blood culture (routine x 2)     Status: None  (Preliminary result)   Collection Time: 08/02/21  1:49 PM   Specimen: BLOOD  Result Value Ref Range Status   Specimen Description BLOOD LW  Final   Special Requests BOTTLES DRAWN AEROBIC AND ANAEROBIC BCAV  Final   Culture   Final    NO GROWTH < 24 HOURS Performed at [facility], [address], [place], [place] [address]    Report Status PENDING  Incomplete  Blood culture (routine x 2)     Status: None (Preliminary result)   Collection Time: 08/02/21  2:07 PM   Specimen: BLOOD  Result Value Ref Range Status   Specimen Description BLOOD BR[facility]  Final   Special Requests BOTTLES DRAWN AEROBIC AND ANAEROBIC BCAV  Final   Culture   Final    NO GROWTH < 24 HOURS Performed at [facility], [address] [place], [place] [address]    Report Status PENDING  Incomplete         Radiology Studies: US Venous Img Lower Bilateral  Result Date: 08/02/2021 CLINICAL DATA:  Shortness of breath EXAM: BILATERAL LOWER EXTREMITY VENOUS DOPPLER ULTRASOUND TECHNIQUE: Gray-scale sonography with graded compression, as well as color Doppler and duplex ultrasound were performed to evaluate the lower extremity deep venous systems from the level of the common femoral vein and including the common femoral, femoral, profunda femoral, popliteal and calf veins including the posterior tibial, peroneal and gastrocnemius veins when visible. The superficial great saphenous vein was also interrogated. Spectral Doppler was utilized to evaluate flow at rest and with distal augmentation maneuvers in the common femoral, femoral and popliteal veins. COMPARISON:  None. FINDINGS: RIGHT LOWER EXTREMITY Common Femoral Vein: No evidence of thrombus. Normal compressibility, respiratory phasicity and response to augmentation. Saphenofemoral Junction: No evidence of thrombus. Normal compressibility and flow on color Doppler imaging. Profunda Femoral Vein: No evidence of thrombus. Normal compressibility and flow on  color Doppler imaging. Femoral Vein: No evidence of thrombus. Normal compressibility, respiratory phasicity and response to augmentation. Popliteal Vein: No evidence of thrombus. Normal compressibility, respiratory phasicity and response to augmentation. Calf Veins: Prior below-knee amputation. Superficial Great Saphenous Vein: No evidence of thrombus. Normal compressibility. Venous Reflux:  None. Other Findings:  None. LEFT LOWER EXTREMITY Common Femoral Vein: No evidence of thrombus. Normal compressibility, respiratory phasicity and response to augmentation. Saphenofemoral Junction: No evidence of thrombus. Normal compressibility and flow on color Doppler imaging. Profunda Femoral Vein: No evidence of thrombus. Normal compressibility and flow on color Doppler imaging. Femoral Vein: No evidence of thrombus. Normal compressibility, respiratory phasicity and response to augmentation. Popliteal Vein: No evidence of thrombus. Normal compressibility, respiratory phasicity and response to augmentation. Calf Veins: No evidence of thrombus. Normal compressibility and flow on color Doppler imaging. Superficial Great Saphenous Vein: No evidence of thrombus. Normal compressibility. Venous Reflux:  None. Other Findings:  None. IMPRESSION: No evidence of deep venous thrombosis in either lower extremity. Electronically Signed   By: Maurine Simmering M.D.   On: 08/02/2021 15:37   DG Chest Portable 1 View  Result Date: 08/02/2021 CLINICAL DATA:  A 66 year old female presents for evaluation of shortness of  breath. EXAM: PORTABLE CHEST 1 VIEW COMPARISON:  April 26, 2021. FINDINGS: RIGHT IJ Port-A-Cath with tip over the distal superior vena cava. EKG leads project over the chest. LEFT hemidiaphragm remains elevated. Distortion of cardiomediastinal contours related to this hemidiaphragmatic elevation with similar appearance to prior imaging. Heart size and mediastinal contours are normal accounting for this finding. No lobar  consolidation. No sign of effusion or pneumothorax. On limited assessment there is no acute skeletal process. IMPRESSION: Electronically Signed   By: Zetta Bills M.D.   On: 08/02/2021 14:52   US Abdomen Limited RUQ (LIVER/GB)  Result Date: 08/03/2021 CLINICAL DATA:  Transaminitis EXAM: ULTRASOUND ABDOMEN LIMITED RIGHT UPPER QUADRANT COMPARISON:  07/28/2021 FINDINGS: Gallbladder: Status post cholecystectomy Common bile duct: Diameter: 3 mm Liver: No focal lesion identified. Within normal limits in parenchymal echogenicity. Portal vein is patent on color Doppler imaging with normal direction of blood flow towards the liver. Other: None. IMPRESSION: Normal sonographic appearance of the liver. Electronically Signed   By: Miachel Roux M.D.   On: 08/03/2021 08:37        Scheduled Meds:  aspirin EC  81 mg Oral q AM   diphenhydrAMINE  25 mg Oral QHS   dronabinol  2.5 mg Oral BID AC   DULoxetine  40 mg Oral BID   gabapentin  100 mg Oral BID   heparin  5,000 Units Subcutaneous Q8H   insulin aspart  8 Units Subcutaneous TID PC   insulin glargine-yfgn  16 Units Subcutaneous Q2200   isosorbide dinitrate  30 mg Oral TID   methylPREDNISolone (SOLU-MEDROL) injection  50 mg Intravenous Q24H   metoprolol succinate  50 mg Oral Daily   multivitamin with minerals  1 tablet Oral q AM   Continuous Infusions:  sodium chloride 100 mL/hr at 08/03/21 0751     LOS: 1 day    Time spent: 40 minutes    Irine Seal, MD [facility]   To contact the attending provider between 7A-7P or the covering provider during after hours 7P-7A, please log into the web site [email or web address] and access using universal [place] password for that web site. If you do not have the password, please call the hospital operator.  08/03/2021, 3:42 PM

## 2021-08-03 NOTE — Progress Notes (Incomplete)
PROGRESS NOTE    [name] [name]  KDX:[id number] DOB: [birth year] DOA: 08/02/2021 PCP: [name], MD    Chief Complaint  Patient presents with   Hypotension    Brief Narrative:  No notes on file    Assessment & Plan:   Assessment and Plan: No notes have been filed under this hospital service. Service: Hospitalist        DVT prophylaxis:  Code Status:  Family Communication:  Disposition:   Status is: Inpatient {Inpatient:23812}           Consultants:  ***  Procedures:  ***  Antimicrobials:  ***    Subjective: Feling very well.   Objective: Vitals:   08/02/21 1821 08/03/21 0134 08/03/21 0551 08/03/21 0846  BP: 130/79 99/82 130/70 140/83  Pulse: 97 98 91 92  Resp: 18 16 16 20   Temp: 98.1 F (36.7 C) 98.1 F (36.7 C) 98.1 F (36.7 C) 97.9 F (36.6 C)  TempSrc:  Oral Oral Oral  SpO2: 100% 100% 99% 100%  Weight:      Height:        Intake/Output Summary (Last 24 hours) at 08/03/2021 0957 Last data filed at 08/03/2021 0526 Gross per 24 hour  Intake 1047.99 ml  Output --  Net 1047.99 ml   Filed Weights   08/02/21 1228  Weight: 51.7 kg    Examination:  General exam: Appears calm and comfortable  Respiratory system: Clear to auscultation. Respiratory effort normal. Cardiovascular system: S1 & S2 heard, RRR. No JVD, murmurs, rubs, gallops or clicks. No pedal edema. Gastrointestinal system: Abdomen is nondistended, soft and nontender. No organomegaly or masses felt. Normal bowel sounds heard. Central nervous system: Alert and oriented. No focal neurological deficits. Extremities: Symmetric 5 x 5 power. Skin: No rashes, lesions or ulcers Psychiatry: Judgement and insight appear normal. Mood & affect appropriate.     Data Reviewed:   CBC: Recent Labs  Lab 07/28/21 0855 08/02/21 1108 08/03/21 0430  WBC 5.6 4.7 4.2  NEUTROABS 4.3 3.1  --   HGB 9.1* 9.6* 7.9*  HCT 27.9* 29.4* 24.2*  MCV 98.6 95.5 94.9  PLT 273 285 256     Basic Metabolic Panel: Recent Labs  Lab 07/28/21 0855 08/02/21 1108 08/03/21 0430  NA 132* 134* 138  K 3.9 3.9 4.1  CL 100 102 112*  CO2 21* 20* 20*  GLUCOSE 175* 140* 117*  BUN 52* 41* 40*  CREATININE 2.13* 2.18* 2.10*  CALCIUM 8.4* 8.7* 7.8*    GFR: Estimated Creatinine Clearance: 22.4 mL/min (A) (by C-G formula based on SCr of 2.1 mg/dL (H)).  Liver Function Tests: Recent Labs  Lab 07/28/21 0855 08/02/21 1106 08/02/21 1108 08/03/21 0430  AST 987* 1,220* 1,230* 930*  ALT 707* 777* 747* 620*  ALKPHOS 601*  --  983* 895*  BILITOT 1.4* 2.6* 2.7* 2.5*  PROT 6.5 6.5 6.5 5.4*  ALBUMIN 3.0* 3.2* 3.0* 2.6*    CBG: Recent Labs  Lab 08/02/21 1825 08/02/21 2022 08/03/21 0847  GLUCAP 121* 164* 91     Recent Results (from the past 240 hour(s))  Resp Panel by RT-PCR (Flu A&B, Covid) Nasopharyngeal Swab     Status: None   Collection Time: 07/28/21  3:14 PM   Specimen: Nasopharyngeal Swab; Nasopharyngeal(NP) swabs in vial transport medium  Result Value Ref Range Status   SARS Coronavirus 2 by RT PCR NEGATIVE NEGATIVE Final    Comment: (NOTE) SARS-CoV-2 target nucleic acids are NOT DETECTED.  The SARS-CoV-2 RNA is generally  detectable in upper respiratory specimens during the acute phase of infection. The lowest concentration of SARS-CoV-2 viral copies this assay can detect is 138 copies/mL. A negative result does not preclude SARS-Cov-2 infection and should not be used as the sole basis for treatment or other patient management decisions. A negative result may occur with  improper specimen collection/handling, submission of specimen other than nasopharyngeal swab, presence of viral mutation(s) within the areas targeted by this assay, and inadequate number of viral copies(<138 copies/mL). A negative result must be combined with clinical observations, patient history, and epidemiological information. The expected result is Negative.  Fact Sheet for Patients:   EntrepreneurPulse.com.au  Fact Sheet for Healthcare Providers:  IncredibleEmployment.be  This test is no t yet approved or cleared by the [place] FDA and  has been authorized for detection and/or diagnosis of SARS-CoV-2 by FDA under an Emergency Use Authorization (EUA). This EUA will remain  in effect (meaning this test can be used) for the duration of the COVID-19 declaration under Section 564(b)(1) of the Act, 21 U.S.C.section 360bbb-3(b)(1), unless the authorization is terminated  or revoked sooner.       Influenza A by PCR NEGATIVE NEGATIVE Final   Influenza B by PCR NEGATIVE NEGATIVE Final    Comment: (NOTE) The Xpert Xpress SARS-CoV-2/FLU/RSV plus assay is intended as an aid in the diagnosis of influenza from Nasopharyngeal swab specimens and should not be used as a sole basis for treatment. Nasal washings and aspirates are unacceptable for Xpert Xpress SARS-CoV-2/FLU/RSV testing.  Fact Sheet for Patients: EntrepreneurPulse.com.au  Fact Sheet for Healthcare Providers: IncredibleEmployment.be  This test is not yet approved or cleared by the [place] FDA and has been authorized for detection and/or diagnosis of SARS-CoV-2 by FDA under an Emergency Use Authorization (EUA). This EUA will remain in effect (meaning this test can be used) for the duration of the COVID-19 declaration under Section 564(b)(1) of the Act, 21 U.S.C. section 360bbb-3(b)(1), unless the authorization is terminated or revoked.  Performed at [facility], [address] [place], [place] [address]   Resp Panel by RT-PCR (Flu A&B, Covid) Nasopharyngeal Swab     Status: None   Collection Time: 08/02/21  1:41 PM   Specimen: Nasopharyngeal Swab; Nasopharyngeal(NP) swabs in vial transport medium  Result Value Ref Range Status   SARS Coronavirus 2 by RT PCR NEGATIVE NEGATIVE Final    Comment: (NOTE) SARS-CoV-2  target nucleic acids are NOT DETECTED.  The SARS-CoV-2 RNA is generally detectable in upper respiratory specimens during the acute phase of infection. The lowest concentration of SARS-CoV-2 viral copies this assay can detect is 138 copies/mL. A negative result does not preclude SARS-Cov-2 infection and should not be used as the sole basis for treatment or other patient management decisions. A negative result may occur with  improper specimen collection/handling, submission of specimen other than nasopharyngeal swab, presence of viral mutation(s) within the areas targeted by this assay, and inadequate number of viral copies(<138 copies/mL). A negative result must be combined with clinical observations, patient history, and epidemiological information. The expected result is Negative.  Fact Sheet for Patients:  EntrepreneurPulse.com.au  Fact Sheet for Healthcare Providers:  IncredibleEmployment.be  This test is no t yet approved or cleared by the [place] FDA and  has been authorized for detection and/or diagnosis of SARS-CoV-2 by FDA under an Emergency Use Authorization (EUA). This EUA will remain  in effect (meaning this test can be used) for the duration of the COVID-19 declaration under Section  564(b)(1) of the Act, 21 U.S.C.section 360bbb-3(b)(1), unless the authorization is terminated  or revoked sooner.       Influenza A by PCR NEGATIVE NEGATIVE Final   Influenza B by PCR NEGATIVE NEGATIVE Final    Comment: (NOTE) The Xpert Xpress SARS-CoV-2/FLU/RSV plus assay is intended as an aid in the diagnosis of influenza from Nasopharyngeal swab specimens and should not be used as a sole basis for treatment. Nasal washings and aspirates are unacceptable for Xpert Xpress SARS-CoV-2/FLU/RSV testing.  Fact Sheet for Patients: EntrepreneurPulse.com.au  Fact Sheet for Healthcare  Providers: IncredibleEmployment.be  This test is not yet approved or cleared by the [place] FDA and has been authorized for detection and/or diagnosis of SARS-CoV-2 by FDA under an Emergency Use Authorization (EUA). This EUA will remain in effect (meaning this test can be used) for the duration of the COVID-19 declaration under Section 564(b)(1) of the Act, 21 U.S.C. section 360bbb-3(b)(1), unless the authorization is terminated or revoked.  Performed at [facility], [address] [place], [place] [address]          Radiology Studies: US Venous Img Lower Bilateral  Result Date: 08/02/2021 CLINICAL DATA:  Shortness of breath EXAM: BILATERAL LOWER EXTREMITY VENOUS DOPPLER ULTRASOUND TECHNIQUE: Gray-scale sonography with graded compression, as well as color Doppler and duplex ultrasound were performed to evaluate the lower extremity deep venous systems from the level of the common femoral vein and including the common femoral, femoral, profunda femoral, popliteal and calf veins including the posterior tibial, peroneal and gastrocnemius veins when visible. The superficial great saphenous vein was also interrogated. Spectral Doppler was utilized to evaluate flow at rest and with distal augmentation maneuvers in the common femoral, femoral and popliteal veins. COMPARISON:  None. FINDINGS: RIGHT LOWER EXTREMITY Common Femoral Vein: No evidence of thrombus. Normal compressibility, respiratory phasicity and response to augmentation. Saphenofemoral Junction: No evidence of thrombus. Normal compressibility and flow on color Doppler imaging. Profunda Femoral Vein: No evidence of thrombus. Normal compressibility and flow on color Doppler imaging. Femoral Vein: No evidence of thrombus. Normal compressibility, respiratory phasicity and response to augmentation. Popliteal Vein: No evidence of thrombus. Normal compressibility, respiratory phasicity and response to  augmentation. Calf Veins: Prior below-knee amputation. Superficial Great Saphenous Vein: No evidence of thrombus. Normal compressibility. Venous Reflux:  None. Other Findings:  None. LEFT LOWER EXTREMITY Common Femoral Vein: No evidence of thrombus. Normal compressibility, respiratory phasicity and response to augmentation. Saphenofemoral Junction: No evidence of thrombus. Normal compressibility and flow on color Doppler imaging. Profunda Femoral Vein: No evidence of thrombus. Normal compressibility and flow on color Doppler imaging. Femoral Vein: No evidence of thrombus. Normal compressibility, respiratory phasicity and response to augmentation. Popliteal Vein: No evidence of thrombus. Normal compressibility, respiratory phasicity and response to augmentation. Calf Veins: No evidence of thrombus. Normal compressibility and flow on color Doppler imaging. Superficial Great Saphenous Vein: No evidence of thrombus. Normal compressibility. Venous Reflux:  None. Other Findings:  None. IMPRESSION: No evidence of deep venous thrombosis in either lower extremity. Electronically Signed   By: Maurine Simmering M.D.   On: 08/02/2021 15:37   DG Chest Portable 1 View  Result Date: 08/02/2021 CLINICAL DATA:  A 66 year old female presents for evaluation of shortness of breath. EXAM: PORTABLE CHEST 1 VIEW COMPARISON:  April 26, 2021. FINDINGS: RIGHT IJ Port-A-Cath with tip over the distal superior vena cava. EKG leads project over the chest. LEFT hemidiaphragm remains elevated. Distortion of cardiomediastinal contours related to this hemidiaphragmatic elevation with similar appearance  to prior imaging. Heart size and mediastinal contours are normal accounting for this finding. No lobar consolidation. No sign of effusion or pneumothorax. On limited assessment there is no acute skeletal process. IMPRESSION: Electronically Signed   By: Zetta Bills M.D.   On: 08/02/2021 14:52   US Abdomen Limited RUQ (LIVER/GB)  Result Date:  08/03/2021 CLINICAL DATA:  Transaminitis EXAM: ULTRASOUND ABDOMEN LIMITED RIGHT UPPER QUADRANT COMPARISON:  07/28/2021 FINDINGS: Gallbladder: Status post cholecystectomy Common bile duct: Diameter: 3 mm Liver: No focal lesion identified. Within normal limits in parenchymal echogenicity. Portal vein is patent on color Doppler imaging with normal direction of blood flow towards the liver. Other: None. IMPRESSION: Normal sonographic appearance of the liver. Electronically Signed   By: Miachel Roux M.D.   On: 08/03/2021 08:37        Scheduled Meds:  aspirin EC  81 mg Oral q AM   diphenhydrAMINE  25 mg Oral QHS   dronabinol  2.5 mg Oral BID AC   DULoxetine  40 mg Oral BID   gabapentin  100 mg Oral BID   heparin  5,000 Units Subcutaneous Q8H   insulin aspart  8 Units Subcutaneous TID PC   insulin glargine-yfgn  16 Units Subcutaneous Q2200   isosorbide dinitrate  30 mg Oral TID   methylPREDNISolone (SOLU-MEDROL) injection  50 mg Intravenous Q24H   metoprolol succinate  50 mg Oral Daily   multivitamin with minerals  1 tablet Oral q AM   Continuous Infusions:  sodium chloride 100 mL/hr at 08/03/21 0751     LOS: 1 day    Time spent: ***    Irine Seal, MD [facility]   To contact the attending provider between 7A-7P or the covering provider during after hours 7P-7A, please log into the web site [email or web address] and access using universal [place] password for that web site. If you do not have the password, please call the hospital operator.  08/03/2021, 9:57 AM

## 2021-08-03 NOTE — Assessment & Plan Note (Addendum)
-   Secondary to prerenal azotemia secondary to volume depletion in the setting of hypovolemic shock. -Aldactone held initially on presentation but has subsequently been resumed. -Blood pressure improved with hydration. -Urinalysis with trace leukocytes, nitrite negative, 0-5 WBCs. -Renal function stabilized with creatinine at 0.90 on day of discharge. -Outpatient follow-up with PCP.

## 2021-08-03 NOTE — Assessment & Plan Note (Addendum)
-   Felt likely secondary to immunotherapy, grade 4 toxicity. -Patient noted to have had ultrasound done 07/28/2021 with no biliary obstruction. -Repeat right upper quadrant ultrasound unremarkable.. -Statin on hold.  -LFTs trending down -Oncology consulted recommended Solu-Medrol 1 mg/kg daily which patient was started on and dose adjusted to twice daily per oncology recommendations with goal to be less than 5 times upper limits of normal (AST <200, ALT < 220) and bili < 2, then subsequently transition to prednisone on discharge with outpatient follow-up with oncology for further taper.  -Patient improved clinically, LFTs trending down with normalization of bilirubin, with transaminitis level seeming to plateau. -Patient was followed by Dr. [name], oncology who felt patient was stable for discharge on prednisone 100 mg daily with PPI prophylaxis until outpatient follow-up for further tapering. -Patient will be discharged in stable improved condition. -Supportive care.

## 2021-08-03 NOTE — Hospital Course (Signed)
Patient is a pleasant unfortunate 66 year old female history of CAD, CHF, CKD, lung cancer, insulin-dependent type 2 diabetes, hypertension hyperlipidemia history of right BKA, PAD, depression presented from oncologist office due to transaminitis, hypotension, tachycardia, poor oral intake.  Initial EKG concerning for ST elevations in leads III, aVF ED provider spoke with cardiology who felt not a STEMI recommended IV fluids with repeat EKG showing resolution of ST elevation.  Patient noted with lung cancer status postsurgical resection, adjuvant chemotherapy, status post 1 dose immunotherapy.  Concern patient's immunotherapy may have led to severe transaminitis.  Oncology following and recommended IV Solu-Medrol which patient was started on.

## 2021-08-03 NOTE — Assessment & Plan Note (Addendum)
-   Patient maintained on home regimen Cymbalta.

## 2021-08-04 DIAGNOSIS — F329 Major depressive disorder, single episode, unspecified: Secondary | ICD-10-CM | POA: Diagnosis not present

## 2021-08-04 DIAGNOSIS — N179 Acute kidney failure, unspecified: Secondary | ICD-10-CM | POA: Diagnosis not present

## 2021-08-04 DIAGNOSIS — E1122 Type 2 diabetes mellitus with diabetic chronic kidney disease: Secondary | ICD-10-CM

## 2021-08-04 DIAGNOSIS — E440 Moderate protein-calorie malnutrition: Secondary | ICD-10-CM | POA: Diagnosis not present

## 2021-08-04 DIAGNOSIS — N183 Chronic kidney disease, stage 3 unspecified: Secondary | ICD-10-CM

## 2021-08-04 DIAGNOSIS — R571 Hypovolemic shock: Secondary | ICD-10-CM | POA: Diagnosis not present

## 2021-08-04 DIAGNOSIS — D649 Anemia, unspecified: Secondary | ICD-10-CM | POA: Diagnosis not present

## 2021-08-04 DIAGNOSIS — R7401 Elevation of levels of liver transaminase levels: Secondary | ICD-10-CM | POA: Diagnosis not present

## 2021-08-04 DIAGNOSIS — I5020 Unspecified systolic (congestive) heart failure: Secondary | ICD-10-CM | POA: Diagnosis not present

## 2021-08-04 DIAGNOSIS — C3490 Malignant neoplasm of unspecified part of unspecified bronchus or lung: Secondary | ICD-10-CM | POA: Diagnosis not present

## 2021-08-04 DIAGNOSIS — N1832 Chronic kidney disease, stage 3b: Secondary | ICD-10-CM | POA: Diagnosis not present

## 2021-08-04 DIAGNOSIS — D509 Iron deficiency anemia, unspecified: Secondary | ICD-10-CM | POA: Diagnosis not present

## 2021-08-04 DIAGNOSIS — Z794 Long term (current) use of insulin: Secondary | ICD-10-CM

## 2021-08-04 DIAGNOSIS — E785 Hyperlipidemia, unspecified: Secondary | ICD-10-CM | POA: Diagnosis not present

## 2021-08-04 DIAGNOSIS — I10 Essential (primary) hypertension: Secondary | ICD-10-CM | POA: Diagnosis not present

## 2021-08-04 LAB — COMPREHENSIVE METABOLIC PANEL
ALT: 559 U/L — ABNORMAL HIGH (ref 0–44)
AST: 679 U/L — ABNORMAL HIGH (ref 15–41)
Albumin: 2.6 g/dL — ABNORMAL LOW (ref 3.5–5.0)
Alkaline Phosphatase: 902 U/L — ABNORMAL HIGH (ref 38–126)
Anion gap: 6 (ref 5–15)
BUN: 29 mg/dL — ABNORMAL HIGH (ref 8–23)
CO2: 20 mmol/L — ABNORMAL LOW (ref 22–32)
Calcium: 8.1 mg/dL — ABNORMAL LOW (ref 8.9–10.3)
Chloride: 114 mmol/L — ABNORMAL HIGH (ref 98–111)
Creatinine, Ser: 1.46 mg/dL — ABNORMAL HIGH (ref 0.44–1.00)
GFR, Estimated: 40 mL/min — ABNORMAL LOW (ref >60)
Glucose, Bld: 199 mg/dL — ABNORMAL HIGH (ref 70–99)
Potassium: 4 mmol/L (ref 3.5–5.1)
Sodium: 140 mmol/L (ref 135–145)
Total Bilirubin: 2.4 mg/dL — ABNORMAL HIGH (ref 0.3–1.2)
Total Protein: 5.9 g/dL — ABNORMAL LOW (ref 6.5–8.1)

## 2021-08-04 LAB — CBC WITH DIFFERENTIAL/PLATELET
Abs Immature Granulocytes: 0.01 10*3/uL (ref 0.00–0.07)
Basophils Absolute: 0 10*3/uL (ref 0.0–0.1)
Basophils Relative: 0 %
Eosinophils Absolute: 0 10*3/uL (ref 0.0–0.5)
Eosinophils Relative: 0 %
HCT: 25.9 % — ABNORMAL LOW (ref 36.0–46.0)
Hemoglobin: 8.6 g/dL — ABNORMAL LOW (ref 12.0–15.0)
Immature Granulocytes: 0 %
Lymphocytes Relative: 10 %
Lymphs Abs: 0.5 10*3/uL — ABNORMAL LOW (ref 0.7–4.0)
MCH: 31.4 pg (ref 26.0–34.0)
MCHC: 33.2 g/dL (ref 30.0–36.0)
MCV: 94.5 fL (ref 80.0–100.0)
Monocytes Absolute: 0.1 10*3/uL (ref 0.1–1.0)
Monocytes Relative: 2 %
Neutro Abs: 4.4 10*3/uL (ref 1.7–7.7)
Neutrophils Relative %: 88 %
Platelets: 285 10*3/uL (ref 150–400)
RBC: 2.74 MIL/uL — ABNORMAL LOW (ref 3.87–5.11)
RDW: 15.6 % — ABNORMAL HIGH (ref 11.5–15.5)
WBC: 5.1 10*3/uL (ref 4.0–10.5)
nRBC: 0 % (ref 0.0–0.2)

## 2021-08-04 LAB — GLUCOSE, CAPILLARY
Glucose-Capillary: 170 mg/dL — ABNORMAL HIGH (ref 70–99)
Glucose-Capillary: 130 mg/dL — ABNORMAL HIGH (ref 70–99)
Glucose-Capillary: 131 mg/dL — ABNORMAL HIGH (ref 70–99)
Glucose-Capillary: 125 mg/dL — ABNORMAL HIGH (ref 70–99)

## 2021-08-04 LAB — FOLATE: Folate: 16.6 ng/mL (ref >5.9)

## 2021-08-04 LAB — VITAMIN B12: Vitamin B-12: 1622 pg/mL — ABNORMAL HIGH (ref 180–914)

## 2021-08-04 LAB — FERRITIN: Ferritin: 288 ng/mL (ref 11–307)

## 2021-08-04 LAB — HEMOGLOBIN A1C
Hgb A1c MFr Bld: 6.1 % — ABNORMAL HIGH (ref 4.8–5.6)
Mean Plasma Glucose: 128.37 mg/dL

## 2021-08-04 LAB — IRON AND TIBC
Iron: 108 ug/dL (ref 28–170)
Saturation Ratios: 31 % (ref 10.4–31.8)
TIBC: 347 ug/dL (ref 250–450)
UIBC: 239 ug/dL

## 2021-08-04 LAB — MAGNESIUM: Magnesium: 2.2 mg/dL (ref 1.7–2.4)

## 2021-08-04 MED ORDER — INSULIN ASPART 100 UNIT/ML IJ SOLN
0.0000 [IU] | Freq: Three times a day (TID) | INTRAMUSCULAR | Status: DC
  Administered 2021-08-04 (×2): 2 [IU] via SUBCUTANEOUS
  Administered 2021-08-05: 08:00:00 3 [IU] via SUBCUTANEOUS
  Filled 2021-08-04 (×3): qty 1

## 2021-08-04 NOTE — Evaluation (Signed)
Occupational Therapy Evaluation Patient Details Name: [name] [name] MRN: [id number] DOB: 01-29-1956 Today's Date: 08/04/2021   History of Present Illness 66 year old female with PMHx of CAD, CHF, CKD, lung cancer, insulin-dependent type 2 diabetes, HTN, HLD, hx of right BKA, PAD, hx DVT and PE (01/2020), NSTEMI (2012), and depression presented from oncologist office due to transaminitis, hypotension, tachycardia, poor oral intake.   Clinical Impression   Pt was seen for OT evaluation this date. Prior to hospital admission, pt was indep with ADL and mobility with prosthetic on. Pt requires assist from family for transportation and laundry, but otherwise independent. She does use a w/c at night for toileting and takes seated shower. Pt reports her home has mold right now so she has been living with her cousin who lives in a 1 story home with 5 steps (R/L rails) and a walk in shower. Pt reports family available to assist as needed. Currently pt demonstrates mild impairments in dynamic balance but able to self correct for slight LOBs as described below (See OT problem list). Pt demo's independence with ADL. Pt currently requires SBA-CGA for ADL mobility without AD. Pt reports feeling at baseline. No skilled OT needs at this time. Will sign off.      Recommendations for follow up therapy are one component of a multi-disciplinary discharge planning process, led by the attending physician.  Recommendations may be updated based on patient status, additional functional criteria and insurance authorization.   Follow Up Recommendations  No OT follow up    Assistance Recommended at Discharge PRN  Patient can return home with the following Assistance with cooking/housework;Assist for transportation;Help with stairs or ramp for entrance    Functional Status Assessment  Patient has had a recent decline in their functional status and demonstrates the ability to make significant improvements in function in a  reasonable and predictable amount of time.  Equipment Recommendations  None recommended by OT    Recommendations for Other Services       Precautions / Restrictions Precautions Precautions: Fall Precaution Comments: hx R BKA, has prosthesis here Restrictions Weight Bearing Restrictions: No      Mobility Bed Mobility Overal bed mobility: Independent                  Transfers Overall transfer level: Needs assistance Equipment used: None Transfers: Sit to/from Stand Sit to Stand: Supervision                  Balance Overall balance assessment: History of Falls, Needs assistance Sitting-balance support: No upper extremity supported, Feet supported Sitting balance-Leahy Scale: Normal     Standing balance support: No upper extremity supported Standing balance-Leahy Scale: Fair Standing balance comment: mildly unsteady, able to self correct slight LOBs                           ADL either performed or assessed with clinical judgement   ADL Overall ADL's : Modified independent                                       General ADL Comments: indep with prosthesis, LB dressing, toilet transfers, supv-CGA for ADL mobility     Vision         Perception     Praxis      Pertinent Vitals/Pain Pain Assessment Pain Assessment: No/denies pain  Hand Dominance Right   Extremity/Trunk Assessment Upper Extremity Assessment Upper Extremity Assessment: Generalized weakness;Overall [facility] for tasks assessed   Lower Extremity Assessment Lower Extremity Assessment: RLE deficits/detail;Defer to PT evaluation;Generalized weakness RLE Deficits / Details: hx R BKA, has prosthesis   Cervical / Trunk Assessment Cervical / Trunk Assessment: Normal   Communication Communication Communication: No difficulties   Cognition Arousal/Alertness: Awake/alert Behavior During Therapy: WFL for tasks assessed/performed Overall Cognitive Status:  Within Functional Limits for tasks assessed                                       General Comments       Exercises     Shoulder Instructions      Home Living Family/patient expects to be discharged to:: Private residence (cousin's home (her home is currently with mold)) Living Arrangements: Other relatives (cousin, cousin's family) Available Help at Discharge: Family;Available 24 hours/day Type of Home: House Home Access: Stairs to enter [employer] of Steps: 4-5 Entrance Stairs-Rails: Left;Right Home Layout: One level     Bathroom Shower/Tub: Occupational psychologist: Standard Bathroom Accessibility: Yes How Accessible: Accessible via wheelchair Home Equipment: Rollator (4 wheels);Rolling [place] (2 wheels);BSC/3in1;Hand held shower head;Shower seat;Wheelchair - manual          Prior Functioning/Environment Prior Level of Function : Needs assist;History of Falls (last six months)       Physical Assist : ADLs (physical)   ADLs (physical): IADLs Mobility Comments: mod indep wiht mobility, no AD but does use w/c at night for getting to/from bathroom when she doesn't have her prosthesis on ADLs Comments: mod indep with seated shower, indep med mgt, has family assist for laundry and transportation for appts/groceries        OT Problem List: Impaired balance (sitting and/or standing)      OT Treatment/Interventions:      OT Goals(Current goals can be found in the care plan section) Acute Rehab OT Goals Patient Stated Goal: feel better and go home OT Goal Formulation: All assessment and education complete, DC therapy  OT Frequency:      Co-evaluation              AM-PAC OT "6 Clicks" Daily Activity     Outcome Measure Help from another person eating meals?: None Help from another person taking care of personal grooming?: None Help from another person toileting, which includes using toliet, bedpan, or urinal?: None Help  from another person bathing (including washing, rinsing, drying)?: None Help from another person to put on and taking off regular upper body clothing?: None Help from another person to put on and taking off regular lower body clothing?: None 6 Click Score: 24   End of Session Equipment Utilized During Treatment: Gait belt Nurse Communication: Other (comment) (nausea)  Activity Tolerance: Patient tolerated treatment well Patient left: in bed;with call bell/phone within reach;with bed alarm set;with nursing/sitter in room  OT Visit Diagnosis: Other abnormalities of gait and mobility (R26.89)                Time: 7341-9379 OT Time Calculation (min): 18 min Charges:  OT General Charges $OT Visit: 1 Visit OT Evaluation $OT Eval Low Complexity: 1 Low  Ardeth Perfect., MPH, MS, OTR/L ascom [phone] 08/04/21, 11:07 AM

## 2021-08-04 NOTE — Evaluation (Addendum)
Physical Therapy Evaluation Patient Details Name: [name] [name] MRN: [id number] DOB: [birth year] Today's Date: 08/04/2021  History of Present Illness  66 year old female with PMHx of CAD, CHF, CKD, lung cancer, insulin-dependent type 2 diabetes, HTN, HLD, hx of right BKA, PAD, hx DVT and PE (01/2020), NSTEMI (2012), and depression presented from oncologist office due to transaminitis, hypotension, tachycardia, poor oral intake.   Clinical Impression   Pt asleep in bed upon PT entrance into room for evaluation. She wakes to voice and tactile cue, and is A&Ox4. She denies any c/o pain at rest. Prior to hospitalization she was independent w/ ADLs and mobility w/ prosthetic on. She reports she requires assist from family for transportation and laundry.   Pt is modI w/ bed mobility and performs sit to stand w/ SUPERVISION. Once standing Pt was very willing to go on a walk, and was able to perform the first 167ft w/ SUPERVISION using RW; during the second 127ft was able to ambulate w/ SUPERVISION using IV pole as support. Once back in room Pt was educated on the importance of continuing to get up and walk around due to the cardiovascular and overall strength/endurance benefits associated. PT also educated her on how the utilization of an AD device can help increase strength and endurance in order to progress to less reliance on AD. Pt will benefit from continued skilled PT in order to improve LE strength, mobility, gait, and restore PLOF. Current discharge recommendation to HHPT is appropriate due to the level of assistance required by the patient to ensure safety and improve overall function.    Recommendations for follow up therapy are one component of a multi-disciplinary discharge planning process, led by the attending physician.  Recommendations may be updated based on patient status, additional functional criteria and insurance authorization.  Follow Up Recommendations Outpatient PT    Assistance  Recommended at Discharge Intermittent Supervision/Assistance  Patient can return home with the following  A little help with walking and/or transfers;A little help with bathing/dressing/bathroom;Assistance with cooking/housework;Assist for transportation;Help with stairs or ramp for entrance    Equipment Recommendations None recommended by PT  Recommendations for Other Services       Functional Status Assessment Patient has had a recent decline in their functional status and demonstrates the ability to make significant improvements in function in a reasonable and predictable amount of time.     Precautions / Restrictions Precautions Precautions: Fall Precaution Comments: hx R BKA, has prosthesis here Restrictions Weight Bearing Restrictions: No      Mobility  Bed Mobility Overal bed mobility: Independent                  Transfers Overall transfer level: Needs assistance Equipment used: None Transfers: Sit to/from Stand Sit to Stand: Supervision                Ambulation/Gait Ambulation/Gait assistance: Supervision Gait Distance (Feet): 320 Feet (ambulated 148ft w/ RW; add. 186ft using IV pole as support) Assistive device: Rolling Brouwer (2 wheels), IV Pole Gait Pattern/deviations: Step-through pattern, Decreased step length - right, Decreased step length - left, Decreased stance time - left, Decreased stride length          Stairs            Wheelchair Mobility    Modified Rankin (Stroke Patients Only)       Balance Overall balance assessment: History of Falls, Needs assistance Sitting-balance support: No upper extremity supported, Feet supported Sitting balance-Leahy Scale: Normal  Standing balance support: No upper extremity supported Standing balance-Leahy Scale: Fair Standing balance comment: mildly unsteady, able to self correct slight LOBs                             Pertinent Vitals/Pain Pain Assessment Pain  Assessment: No/denies pain    Home Living Family/patient expects to be discharged to:: Private residence Living Arrangements: Other relatives Available Help at Discharge: Family;Available 24 hours/day Type of Home: House Home Access: Stairs to enter Entrance Stairs-Rails: Chemical engineer of Steps: 4-5   Home Layout: One level Home Equipment: Rollator (4 wheels);Rolling [place] (2 wheels);BSC/3in1;Hand held shower head;Shower seat;Wheelchair - manual      Prior Function Prior Level of Function : Needs assist;History of Falls (last six months)       Physical Assist : ADLs (physical)   ADLs (physical): IADLs Mobility Comments: mod indep with mobility, no AD but does use w/c at night for getting to/from bathroom when she doesn't have her prosthesis on ADLs Comments: mod indep with seated shower, indep med mgt, has family assist for laundry and transportation for appts/groceries     Hand Dominance        Extremity/Trunk Assessment   Upper Extremity Assessment Upper Extremity Assessment: Overall WFL for tasks assessed;Generalized weakness    Lower Extremity Assessment Lower Extremity Assessment: Overall WFL for tasks assessed;RLE deficits/detail;Generalized weakness RLE Deficits / Details: hx R BKA, has prosthesis       Communication      Cognition Arousal/Alertness: Awake/alert Behavior During Therapy: WFL for tasks assessed/performed Overall Cognitive Status: Within Functional Limits for tasks assessed                                          General Comments      Exercises     Assessment/Plan    PT Assessment Patient needs continued PT services  PT Problem List Decreased strength;Decreased mobility;Decreased range of motion;Decreased coordination;Decreased activity tolerance;Decreased balance       PT Treatment Interventions DME instruction;Therapeutic exercise;Gait training;Balance training;Stair training;Therapeutic  activities;Patient/family education;Neuromuscular re-education;Functional mobility training    PT Goals (Current goals can be found in the Care Plan section)  Acute Rehab PT Goals Patient Stated Goal: improve symptoms to return home PT Goal Formulation: With patient Time For Goal Achievement: 08/18/21 Potential to Achieve Goals: Good Additional Goals Additional Goal #1: Pt will be able to ambulate >1054ft w/ modI and least restricted AD for increased community engagement    Frequency Min 2X/week     Co-evaluation               AM-PAC PT "6 Clicks" Mobility  Outcome Measure Help needed turning from your back to your side while in a flat bed without using bedrails?: None Help needed moving from lying on your back to sitting on the side of a flat bed without using bedrails?: None Help needed moving to and from a bed to a chair (including a wheelchair)?: None Help needed standing up from a chair using your arms (e.g., wheelchair or bedside chair)?: None Help needed to walk in hospital room?: A Little Help needed climbing 3-5 steps with a railing? : A Little 6 Click Score: 22    End of Session Equipment Utilized During Treatment: Gait belt Activity Tolerance: Patient tolerated treatment well;No increased pain Patient left: in bed;with call  bell/phone within reach Nurse Communication: Mobility status PT Visit Diagnosis: Unsteadiness on feet (R26.81);Other abnormalities of gait and mobility (R26.89);History of falling (Z91.81);Muscle weakness (generalized) (M62.81)    Time: 5749-3552 PT Time Calculation (min) (ACUTE ONLY): 19 min   Charges:   PT Evaluation $PT Eval Low Complexity: 1 Low PT Treatments $Gait Training: 8-22 mins       Jonnie Kind, SPT 08/04/2021, 4:28 PM

## 2021-08-04 NOTE — Progress Notes (Addendum)
PROGRESS NOTE    [name] [name]  WEX:[id number] DOB: Apr 13, 1956 DOA: 08/02/2021 PCP: [name], MD    Chief Complaint  Patient presents with   Hypotension    Brief Narrative:  Patient is a pleasant unfortunate 66 year old female history of CAD, CHF, CKD, lung cancer, insulin-dependent type 2 diabetes, hypertension hyperlipidemia history of right BKA, PAD, depression presented from oncologist office due to transaminitis, hypotension, tachycardia, poor oral intake.  Initial EKG concerning for ST elevations in leads III, aVF ED provider spoke with cardiology who felt not a STEMI recommended IV fluids with repeat EKG showing resolution of ST elevation.  Patient noted with lung cancer status postsurgical resection, adjuvant chemotherapy, status post 1 dose immunotherapy.  Concern patient's immunotherapy may have led to severe transaminitis.  Oncology following and recommended IV Solu-Medrol which patient was started on.    Assessment & Plan:   Assessment and Plan: * Transaminitis- (present on admission) - Felt likely secondary to immunotherapy. -Patient noted to have had ultrasound done 07/28/2021 with no biliary obstruction. -Repeat right upper quadrant ultrasound unremarkable.. -Continue to hold statin. -LFTs trending down -Oncology consulted recommended Solu-Medrol 1 mg/kg daily which patient was started on and dose adjusted to twice daily per oncology recommendations with goal to be less than 5 times upper limits of normal (AST <200, ALT < 220) and bili < 2, then subsequently transition to prednisone with a slow taper.  Oncology recommendations. -Supportive care.  Hypovolemic shock ([place])- (present on admission) - Hypovolemic shock felt likely secondary to volume depletion. -Blood pressure improved with IV fluids.   -No source of infection noted.   -Saline lock IV fluids.   AKI (acute kidney injury) ([place])- (present on admission) - Secondary to prerenal azotemia secondary to  volume depletion in the setting of hypovolemic shock. -Aldactone held. -Blood pressure improved with hydration. -Urinalysis with trace leukocytes, nitrite negative, 0-5 WBCs. -Renal function trending down. -Saline lock IV fluids..  Normocytic anemia- (present on admission) - Hemoglobin stable at 8.6. -Follow H&H. -No signs of bleeding -Transfusion threshold hemoglobin < 7.   Stage 3b chronic kidney disease ([place])- (present on admission) - Creatinine elevated from baseline. -Renal function improving with hydration. -Saline lock IV fluids -See AKI.  Moderate protein-calorie malnutrition ([place])- (present on admission) - Secondary to non-small cell lung cancer. -Nutritional supplementation.  Non-small cell lung cancer ([place])- (present on admission) - Being followed by oncology.   -Outpatient follow-up with oncology -  Primary hypertension- (present on admission) - Patient noted to be in hypovolemic shock on admission which responded to IV fluids. -BP improved. -Continue Isordil and Toprol-XL.   -Saline lock IV fluids.    Hyperlipidemia- (present on admission) - Continue to hold statin secondary to transaminitis  Depression- (present on admission) - Cymbalta.  Type II diabetes mellitus ([facility]) - Hemoglobin A1c 7.8 (01/25/2021) -Repeat hemoglobin A1c 6.1 (08/04/2021). -CBG 170 this morning.   -Patient on IV steroids  -Continue current regimen of Semglee 16 units daily, NovoLog 8 units 3 times daily, SSI . -Outpatient follow-up.  Tobacco use disorder- (present on admission) - Tobacco cessation.  Heart failure with reduced ejection fraction ([place])- (present on admission) - Stable. -Continue Isordil, Toprol-XL         DVT prophylaxis: Heparin Code Status: Full Family Communication: Updated patient, no family at bedside. Disposition:   Status is: Inpatient Remains inpatient appropriate because: Severity of illness           Consultants:  Oncology:  Dr.Yu  Procedures:  Right upper  quadrant ultrasound 08/03/2021 Chest x-ray 08/02/2021 Lower extremity Dopplers 08/02/2021    Antimicrobials:  None   Subjective: -Patient sitting up in bed.  Denies any chest pain.  No shortness of breath.  No abdominal pain.  Complain of some nausea that she is attributing to elevated blood pressure.  Tolerating current diet.  Therapy at bedside.  Objective: Vitals:   08/03/21 2016 08/04/21 0518 08/04/21 0823 08/04/21 1557  BP: (!) 142/79 (!) 155/74 (!) 173/93 (!) 154/94  Pulse: 74 79 80 77  Resp: 20 16 16 17   Temp: 97.7 F (36.5 C) 97.9 F (36.6 C) 98.1 F (36.7 C) 97.9 F (36.6 C)  TempSrc:  Oral Oral   SpO2: 100% 99% 100% 100%  Weight:      Height:        Intake/Output Summary (Last 24 hours) at 08/04/2021 1601 Last data filed at 08/04/2021 1018 Gross per 24 hour  Intake 560 ml  Output --  Net 560 ml   Filed Weights   08/02/21 1228  Weight: 51.7 kg    Examination:  General exam: NAD. Respiratory system: CTA B.  No wheezes, no crackles, no rhonchi.  Normal air movement.  No use of accessory muscles of respiration. Cardiovascular system: Regular rate rhythm no murmurs rubs or gallops.  No JVD.  No lower extremity edema. Gastrointestinal system: Abdomen is nondistended, soft and nontender. No organomegaly or masses felt. Normal bowel sounds heard. Central nervous system: Alert and oriented. No focal neurological deficits. Extremities: Symmetric 5 x 5 power.  Status post right BKA with prosthetic leg on. Skin: No rashes, lesions or ulcers Psychiatry: Judgement and insight appear normal. Mood & affect appropriate.     Data Reviewed:   CBC: Recent Labs  Lab 08/02/21 1108 08/03/21 0430 08/04/21 0356  WBC 4.7 4.2 5.1  NEUTROABS 3.1  --  4.4  HGB 9.6* 7.9* 8.6*  HCT 29.4* 24.2* 25.9*  MCV 95.5 94.9 94.5  PLT 285 256 811    Basic Metabolic Panel: Recent Labs  Lab 08/02/21 1108 08/03/21 0430 08/04/21 0356  NA 134* 138  140  K 3.9 4.1 4.0  CL 102 112* 114*  CO2 20* 20* 20*  GLUCOSE 140* 117* 199*  BUN 41* 40* 29*  CREATININE 2.18* 2.10* 1.46*  CALCIUM 8.7* 7.8* 8.1*  MG  --   --  2.2    GFR: Estimated Creatinine Clearance: 32.2 mL/min (A) (by C-G formula based on SCr of 1.46 mg/dL (H)).  Liver Function Tests: Recent Labs  Lab 08/02/21 1106 08/02/21 1108 08/03/21 0430 08/04/21 0356  AST 1,220* 1,230* 930* 679*  ALT 777* 747* 620* 559*  ALKPHOS  --  983* 895* 902*  BILITOT 2.6* 2.7* 2.5* 2.4*  PROT 6.5 6.5 5.4* 5.9*  ALBUMIN 3.2* 3.0* 2.6* 2.6*    CBG: Recent Labs  Lab 08/03/21 1628 08/03/21 2143 08/04/21 0745 08/04/21 1130 08/04/21 1557  GLUCAP 172* 111* 170* 130* 131*     Recent Results (from the past 240 hour(s))  Resp Panel by RT-PCR (Flu A&B, Covid) Nasopharyngeal Swab     Status: None   Collection Time: 07/28/21  3:14 PM   Specimen: Nasopharyngeal Swab; Nasopharyngeal(NP) swabs in vial transport medium  Result Value Ref Range Status   SARS Coronavirus 2 by RT PCR NEGATIVE NEGATIVE Final    Comment: (NOTE) SARS-CoV-2 target nucleic acids are NOT DETECTED.  The SARS-CoV-2 RNA is generally detectable in upper respiratory specimens during the acute phase of infection. The lowest concentration of SARS-CoV-2  viral copies this assay can detect is 138 copies/mL. A negative result does not preclude SARS-Cov-2 infection and should not be used as the sole basis for treatment or other patient management decisions. A negative result may occur with  improper specimen collection/handling, submission of specimen other than nasopharyngeal swab, presence of viral mutation(s) within the areas targeted by this assay, and inadequate number of viral copies(<138 copies/mL). A negative result must be combined with clinical observations, patient history, and epidemiological information. The expected result is Negative.  Fact Sheet for Patients:   EntrepreneurPulse.com.au  Fact Sheet for Healthcare Providers:  IncredibleEmployment.be  This test is no t yet approved or cleared by the [place] FDA and  has been authorized for detection and/or diagnosis of SARS-CoV-2 by FDA under an Emergency Use Authorization (EUA). This EUA will remain  in effect (meaning this test can be used) for the duration of the COVID-19 declaration under Section 564(b)(1) of the Act, 21 U.S.C.section 360bbb-3(b)(1), unless the authorization is terminated  or revoked sooner.       Influenza A by PCR NEGATIVE NEGATIVE Final   Influenza B by PCR NEGATIVE NEGATIVE Final    Comment: (NOTE) The Xpert Xpress SARS-CoV-2/FLU/RSV plus assay is intended as an aid in the diagnosis of influenza from Nasopharyngeal swab specimens and should not be used as a sole basis for treatment. Nasal washings and aspirates are unacceptable for Xpert Xpress SARS-CoV-2/FLU/RSV testing.  Fact Sheet for Patients: EntrepreneurPulse.com.au  Fact Sheet for Healthcare Providers: IncredibleEmployment.be  This test is not yet approved or cleared by the [place] FDA and has been authorized for detection and/or diagnosis of SARS-CoV-2 by FDA under an Emergency Use Authorization (EUA). This EUA will remain in effect (meaning this test can be used) for the duration of the COVID-19 declaration under Section 564(b)(1) of the Act, 21 U.S.C. section 360bbb-3(b)(1), unless the authorization is terminated or revoked.  Performed at [facility], [address] [place], [place] [address]   Resp Panel by RT-PCR (Flu A&B, Covid) Nasopharyngeal Swab     Status: None   Collection Time: 08/02/21  1:41 PM   Specimen: Nasopharyngeal Swab; Nasopharyngeal(NP) swabs in vial transport medium  Result Value Ref Range Status   SARS Coronavirus 2 by RT PCR NEGATIVE NEGATIVE Final    Comment: (NOTE) SARS-CoV-2  target nucleic acids are NOT DETECTED.  The SARS-CoV-2 RNA is generally detectable in upper respiratory specimens during the acute phase of infection. The lowest concentration of SARS-CoV-2 viral copies this assay can detect is 138 copies/mL. A negative result does not preclude SARS-Cov-2 infection and should not be used as the sole basis for treatment or other patient management decisions. A negative result may occur with  improper specimen collection/handling, submission of specimen other than nasopharyngeal swab, presence of viral mutation(s) within the areas targeted by this assay, and inadequate number of viral copies(<138 copies/mL). A negative result must be combined with clinical observations, patient history, and epidemiological information. The expected result is Negative.  Fact Sheet for Patients:  EntrepreneurPulse.com.au  Fact Sheet for Healthcare Providers:  IncredibleEmployment.be  This test is no t yet approved or cleared by the [place] FDA and  has been authorized for detection and/or diagnosis of SARS-CoV-2 by FDA under an Emergency Use Authorization (EUA). This EUA will remain  in effect (meaning this test can be used) for the duration of the COVID-19 declaration under Section 564(b)(1) of the Act, 21 U.S.C.section 360bbb-3(b)(1), unless the authorization is terminated  or revoked sooner.  Influenza A by PCR NEGATIVE NEGATIVE Final   Influenza B by PCR NEGATIVE NEGATIVE Final    Comment: (NOTE) The Xpert Xpress SARS-CoV-2/FLU/RSV plus assay is intended as an aid in the diagnosis of influenza from Nasopharyngeal swab specimens and should not be used as a sole basis for treatment. Nasal washings and aspirates are unacceptable for Xpert Xpress SARS-CoV-2/FLU/RSV testing.  Fact Sheet for Patients: EntrepreneurPulse.com.au  Fact Sheet for Healthcare  Providers: IncredibleEmployment.be  This test is not yet approved or cleared by the [place] FDA and has been authorized for detection and/or diagnosis of SARS-CoV-2 by FDA under an Emergency Use Authorization (EUA). This EUA will remain in effect (meaning this test can be used) for the duration of the COVID-19 declaration under Section 564(b)(1) of the Act, 21 U.S.C. section 360bbb-3(b)(1), unless the authorization is terminated or revoked.  Performed at [facility], [address] [place], [place] [address]   Blood culture (routine x 2)     Status: None (Preliminary result)   Collection Time: 08/02/21  1:49 PM   Specimen: BLOOD  Result Value Ref Range Status   Specimen Description BLOOD LW  Final   Special Requests BOTTLES DRAWN AEROBIC AND ANAEROBIC BCAV  Final   Culture   Final    NO GROWTH 2 DAYS Performed at [facility], [address], [place], [place] [address]    Report Status PENDING  Incomplete  Blood culture (routine x 2)     Status: None (Preliminary result)   Collection Time: 08/02/21  2:07 PM   Specimen: BLOOD  Result Value Ref Range Status   Specimen Description BLOOD BRH  Final   Special Requests BOTTLES DRAWN AEROBIC AND ANAEROBIC BCAV  Final   Culture   Final    NO GROWTH 2 DAYS Performed at [facility], [address] [place], [place] [address]    Report Status PENDING  Incomplete         Radiology Studies: US Abdomen Limited RUQ (LIVER/GB)  Result Date: 08/03/2021 CLINICAL DATA:  Transaminitis EXAM: ULTRASOUND ABDOMEN LIMITED RIGHT UPPER QUADRANT COMPARISON:  07/28/2021 FINDINGS: Gallbladder: Status post cholecystectomy Common bile duct: Diameter: 3 mm Liver: No focal lesion identified. Within normal limits in parenchymal echogenicity. Portal vein is patent on color Doppler imaging with normal direction of blood flow towards the liver. Other: None. IMPRESSION: Normal sonographic appearance of  the liver. Electronically Signed   By: Miachel Roux M.D.   On: 08/03/2021 08:37        Scheduled Meds:  aspirin EC  81 mg Oral q AM   diphenhydrAMINE  25 mg Oral QHS   dronabinol  2.5 mg Oral BID AC   DULoxetine  40 mg Oral BID   gabapentin  100 mg Oral BID   heparin  5,000 Units Subcutaneous Q8H   insulin aspart  0-15 Units Subcutaneous TID WC   insulin aspart  8 Units Subcutaneous TID PC   insulin glargine-yfgn  16 Units Subcutaneous Q2200   isosorbide dinitrate  30 mg Oral TID   methylPREDNISolone (SOLU-MEDROL) injection  50 mg Intravenous Q12H   metoprolol succinate  50 mg Oral Daily   multivitamin with minerals  1 tablet Oral q AM   Continuous Infusions:     LOS: 2 days    Time spent: 35 minutes    Irine Seal, MD [facility]   To contact the attending provider between 7A-7P or the covering provider during after hours 7P-7A, please log into the web  site [email or web address] and access using universal [place] password for that web site. If you do not have the password, please call the hospital operator.  08/04/2021, 4:01 PM

## 2021-08-05 ENCOUNTER — Encounter: Payer: Self-pay | Admitting: Family Medicine

## 2021-08-05 DIAGNOSIS — N179 Acute kidney failure, unspecified: Secondary | ICD-10-CM | POA: Diagnosis not present

## 2021-08-05 DIAGNOSIS — R7401 Elevation of levels of liver transaminase levels: Secondary | ICD-10-CM | POA: Diagnosis not present

## 2021-08-05 DIAGNOSIS — E440 Moderate protein-calorie malnutrition: Secondary | ICD-10-CM | POA: Diagnosis not present

## 2021-08-05 DIAGNOSIS — R7989 Other specified abnormal findings of blood chemistry: Secondary | ICD-10-CM

## 2021-08-05 DIAGNOSIS — I10 Essential (primary) hypertension: Secondary | ICD-10-CM | POA: Diagnosis not present

## 2021-08-05 DIAGNOSIS — D649 Anemia, unspecified: Secondary | ICD-10-CM | POA: Diagnosis not present

## 2021-08-05 DIAGNOSIS — N1832 Chronic kidney disease, stage 3b: Secondary | ICD-10-CM | POA: Diagnosis not present

## 2021-08-05 DIAGNOSIS — I5020 Unspecified systolic (congestive) heart failure: Secondary | ICD-10-CM | POA: Diagnosis not present

## 2021-08-05 DIAGNOSIS — E785 Hyperlipidemia, unspecified: Secondary | ICD-10-CM | POA: Diagnosis not present

## 2021-08-05 DIAGNOSIS — Z7952 Long term (current) use of systemic steroids: Secondary | ICD-10-CM

## 2021-08-05 DIAGNOSIS — R571 Hypovolemic shock: Secondary | ICD-10-CM | POA: Diagnosis not present

## 2021-08-05 DIAGNOSIS — C3490 Malignant neoplasm of unspecified part of unspecified bronchus or lung: Secondary | ICD-10-CM | POA: Diagnosis not present

## 2021-08-05 DIAGNOSIS — F329 Major depressive disorder, single episode, unspecified: Secondary | ICD-10-CM | POA: Diagnosis not present

## 2021-08-05 DIAGNOSIS — D509 Iron deficiency anemia, unspecified: Secondary | ICD-10-CM | POA: Diagnosis not present

## 2021-08-05 LAB — COMPREHENSIVE METABOLIC PANEL
ALT: 451 U/L — ABNORMAL HIGH (ref 0–44)
AST: 431 U/L — ABNORMAL HIGH (ref 15–41)
Albumin: 2.5 g/dL — ABNORMAL LOW (ref 3.5–5.0)
Alkaline Phosphatase: 842 U/L — ABNORMAL HIGH (ref 38–126)
Anion gap: 3 — ABNORMAL LOW (ref 5–15)
BUN: 19 mg/dL (ref 8–23)
CO2: 23 mmol/L (ref 22–32)
Calcium: 8 mg/dL — ABNORMAL LOW (ref 8.9–10.3)
Chloride: 114 mmol/L — ABNORMAL HIGH (ref 98–111)
Creatinine, Ser: 1.13 mg/dL — ABNORMAL HIGH (ref 0.44–1.00)
GFR, Estimated: 55 mL/min — ABNORMAL LOW (ref >60)
Glucose, Bld: 181 mg/dL — ABNORMAL HIGH (ref 70–99)
Potassium: 4.2 mmol/L (ref 3.5–5.1)
Sodium: 140 mmol/L (ref 135–145)
Total Bilirubin: 1.5 mg/dL — ABNORMAL HIGH (ref 0.3–1.2)
Total Protein: 5.4 g/dL — ABNORMAL LOW (ref 6.5–8.1)

## 2021-08-05 LAB — GLUCOSE, CAPILLARY
Glucose-Capillary: 152 mg/dL — ABNORMAL HIGH (ref 70–99)
Glucose-Capillary: 60 mg/dL — ABNORMAL LOW (ref 70–99)
Glucose-Capillary: 84 mg/dL (ref 70–99)
Glucose-Capillary: 213 mg/dL — ABNORMAL HIGH (ref 70–99)
Glucose-Capillary: 235 mg/dL — ABNORMAL HIGH (ref 70–99)

## 2021-08-05 LAB — CBC
HCT: 25.4 % — ABNORMAL LOW (ref 36.0–46.0)
Hemoglobin: 8.3 g/dL — ABNORMAL LOW (ref 12.0–15.0)
MCH: 31.1 pg (ref 26.0–34.0)
MCHC: 32.7 g/dL (ref 30.0–36.0)
MCV: 95.1 fL (ref 80.0–100.0)
Platelets: 267 10*3/uL (ref 150–400)
RBC: 2.67 MIL/uL — ABNORMAL LOW (ref 3.87–5.11)
RDW: 15.9 % — ABNORMAL HIGH (ref 11.5–15.5)
WBC: 4.2 10*3/uL (ref 4.0–10.5)
nRBC: 0 % (ref 0.0–0.2)

## 2021-08-05 LAB — MAGNESIUM: Magnesium: 2.2 mg/dL (ref 1.7–2.4)

## 2021-08-05 MED ORDER — DOCUSATE SODIUM 100 MG PO CAPS
100.0000 mg | ORAL_CAPSULE | Freq: Two times a day (BID) | ORAL | Status: DC
  Administered 2021-08-05 – 2021-08-06 (×4): 100 mg via ORAL
  Filled 2021-08-05 (×5): qty 1

## 2021-08-05 MED ORDER — INSULIN GLARGINE-YFGN 100 UNIT/ML ~~LOC~~ SOLN
14.0000 [IU] | Freq: Every day | SUBCUTANEOUS | Status: DC
  Administered 2021-08-05: 23:00:00 14 [IU] via SUBCUTANEOUS
  Filled 2021-08-05 (×2): qty 0.14

## 2021-08-05 MED ORDER — INSULIN ASPART 100 UNIT/ML IJ SOLN
0.0000 [IU] | Freq: Three times a day (TID) | INTRAMUSCULAR | Status: DC
  Administered 2021-08-05 – 2021-08-06 (×2): 3 [IU] via SUBCUTANEOUS
  Administered 2021-08-06: 09:00:00 2 [IU] via SUBCUTANEOUS
  Administered 2021-08-06: 5 [IU] via SUBCUTANEOUS
  Administered 2021-08-07 (×3): 7 [IU] via SUBCUTANEOUS
  Administered 2021-08-08: 13:00:00 2 [IU] via SUBCUTANEOUS
  Administered 2021-08-08: 17:00:00 7 [IU] via SUBCUTANEOUS
  Administered 2021-08-08: 2 [IU] via SUBCUTANEOUS
  Administered 2021-08-09: 1 [IU] via SUBCUTANEOUS
  Administered 2021-08-09: 2 [IU] via SUBCUTANEOUS
  Filled 2021-08-05 (×12): qty 1

## 2021-08-05 MED ORDER — PANTOPRAZOLE SODIUM 40 MG PO TBEC
40.0000 mg | DELAYED_RELEASE_TABLET | Freq: Every day | ORAL | Status: DC
  Administered 2021-08-05 – 2021-08-09 (×5): 40 mg via ORAL
  Filled 2021-08-05 (×5): qty 1

## 2021-08-05 MED ORDER — INSULIN ASPART 100 UNIT/ML IJ SOLN
4.0000 [IU] | Freq: Three times a day (TID) | INTRAMUSCULAR | Status: DC
  Administered 2021-08-05 – 2021-08-06 (×4): 4 [IU] via SUBCUTANEOUS
  Filled 2021-08-05 (×4): qty 1

## 2021-08-05 NOTE — Progress Notes (Signed)
PROGRESS NOTE    BEADIE MATSUNAGA  VOH:[id number] DOB: 27-Jan-1956 DOA: 08/02/2021 PCP: [name], MD    Chief Complaint  Patient presents with   Hypotension    Brief Narrative:  Patient is a pleasant unfortunate 66 year old female history of CAD, CHF, CKD, lung cancer, insulin-dependent type 2 diabetes, hypertension hyperlipidemia history of right BKA, PAD, depression presented from oncologist office due to transaminitis, hypotension, tachycardia, poor oral intake.  Initial EKG concerning for ST elevations in leads III, aVF ED provider spoke with cardiology who felt not a STEMI recommended IV fluids with repeat EKG showing resolution of ST elevation.  Patient noted with lung cancer status postsurgical resection, adjuvant chemotherapy, status post 1 dose immunotherapy.  Concern patient's immunotherapy may have led to severe transaminitis.  Oncology following and recommended IV Solu-Medrol which patient was started on.    Assessment & Plan:   Assessment and Plan: * Transaminitis- (present on admission) - Felt likely secondary to immunotherapy. -Patient noted to have had ultrasound done 07/28/2021 with no biliary obstruction. -Repeat right upper quadrant ultrasound unremarkable.. -Statin on hold.  -LFTs trending down -Oncology consulted recommended Solu-Medrol 1 mg/kg daily which patient was started on and dose adjusted to twice daily per oncology recommendations with goal to be less than 5 times upper limits of normal (AST <200, ALT < 220) and bili < 2, then subsequently transition to prednisone with a slow taper per Oncology recommendations. -Supportive care.  Hypovolemic shock ([place])- (present on admission) - Hypovolemic shock felt likely secondary to volume depletion. -Blood pressure improved with IV fluids.   -No source of infection noted.   -IV fluids have been saline locked.    AKI (acute kidney injury) ([place])- (present on admission) - Secondary to prerenal azotemia secondary  to volume depletion in the setting of hypovolemic shock. -Aldactone held. -Blood pressure improved with hydration. -Urinalysis with trace leukocytes, nitrite negative, 0-5 WBCs. -Renal function trending down. -Saline lock IV fluids..  Normocytic anemia- (present on admission) - Hemoglobin stable at 8.3. -Follow H&H. -No signs of bleeding -Transfusion threshold hemoglobin < 7.   Stage 3b chronic kidney disease ([place])- (present on admission) - Creatinine elevated from baseline. -Renal function improved with hydration.   -IV fluids saline locked.  -See AKI.  Moderate protein-calorie malnutrition ([place])- (present on admission) - Secondary to non-small cell lung cancer. -Nutritional supplementation.  Non-small cell lung cancer ([place])- (present on admission) - Being followed by oncology.   -Outpatient follow-up with oncology -  Primary hypertension- (present on admission) - Patient noted to be in hypovolemic shock on admission which responded to IV fluids. -BP improved. -Continue Isordil and Toprol-XL.   -Saline lock IV fluids.    Hyperlipidemia- (present on admission) - Continue to hold statin secondary to transaminitis  Depression- (present on admission) - Cymbalta.  Type II diabetes mellitus ([facility]) - Hemoglobin A1c 7.8 (01/25/2021) -Repeat hemoglobin A1c 6.1 (08/04/2021). -CBG 60 this morning.   -Patient on IV steroids  -Decrease Semglee to 14 units daily.   -Decrease NovoLog sliding scale to 4 units 3 times daily.  -Change sliding scale insulin from moderate to sensitive scale.  -Outpatient follow-up.   Tobacco use disorder- (present on admission) - Tobacco cessation.  Heart failure with reduced ejection fraction ([place])- (present on admission) - Stable. -Continue Isordil, Toprol-XL -Continue to hold Aldactone and could potentially resume in 1 to 2 days.         DVT prophylaxis: Heparin Code Status: Full Family Communication: Updated patient, no family at  bedside. Disposition:   Status is: Inpatient Remains inpatient appropriate because: Severity of illness           Consultants:  Oncology: Dr.Yu  Procedures:  Right upper quadrant ultrasound 08/03/2021 Chest x-ray 08/02/2021 Lower extremity Dopplers 08/02/2021    Antimicrobials:  None   Subjective: -Sleeping but arousable.  No chest pain.  No shortness of breath.  No abdominal pain.  No nausea or emesis.  Feeling significantly better than she did on admission.    Objective: Vitals:   08/04/21 2054 08/05/21 0509 08/05/21 0744 08/05/21 1126  BP: (!) 158/98 (!) 151/82 (!) 155/87 (!) 152/88  Pulse: 78 72 76 77  Resp: 18 16 17 16   Temp: 98.1 F (36.7 C) 98.2 F (36.8 C) 97.9 F (36.6 C) 97.7 F (36.5 C)  TempSrc: Oral Oral Oral Oral  SpO2: 98% 100% 100% 100%  Weight:      Height:        Intake/Output Summary (Last 24 hours) at 08/05/2021 1606 Last data filed at 08/05/2021 1429 Gross per 24 hour  Intake 480 ml  Output --  Net 480 ml   Filed Weights   08/02/21 1228  Weight: 51.7 kg    Examination:  General exam: NAD. Respiratory system: Lungs clear to auscultation bilaterally.  No wheezes, no crackles, no rhonchi.  Normal respiratory effort.  No use of accessory muscles of respiration.  Speaking in full sentences. Cardiovascular system: RRR no murmurs rubs or gallops.  No JVD.  No lower extremity edema.  Gastrointestinal system: Abdomen is soft, nontender, nondistended, positive bowel sounds.  No rebound.  No guarding.   Central nervous system: Alert and oriented. No focal neurological deficits. Extremities: Symmetric 5 x 5 power.  Status post right BKA. Skin: No rashes, lesions or ulcers Psychiatry: Judgement and insight appear normal. Mood & affect appropriate.     Data Reviewed:   CBC: Recent Labs  Lab 08/02/21 1108 08/03/21 0430 08/04/21 0356 08/05/21 0413  WBC 4.7 4.2 5.1 4.2  NEUTROABS 3.1  --  4.4  --   HGB 9.6* 7.9* 8.6* 8.3*  HCT 29.4*  24.2* 25.9* 25.4*  MCV 95.5 94.9 94.5 95.1  PLT 285 256 285 161    Basic Metabolic Panel: Recent Labs  Lab 08/02/21 1108 08/03/21 0430 08/04/21 0356 08/05/21 0413  NA 134* 138 140 140  K 3.9 4.1 4.0 4.2  CL 102 112* 114* 114*  CO2 20* 20* 20* 23  GLUCOSE 140* 117* 199* 181*  BUN 41* 40* 29* 19  CREATININE 2.18* 2.10* 1.46* 1.13*  CALCIUM 8.7* 7.8* 8.1* 8.0*  MG  --   --  2.2 2.2    GFR: Estimated Creatinine Clearance: 41.6 mL/min (A) (by C-G formula based on SCr of 1.13 mg/dL (H)).  Liver Function Tests: Recent Labs  Lab 08/02/21 1106 08/02/21 1108 08/03/21 0430 08/04/21 0356 08/05/21 0413  AST 1,220* 1,230* 930* 679* 431*  ALT 777* 747* 620* 559* 451*  ALKPHOS  --  983* 895* 902* 842*  BILITOT 2.6* 2.7* 2.5* 2.4* 1.5*  PROT 6.5 6.5 5.4* 5.9* 5.4*  ALBUMIN 3.2* 3.0* 2.6* 2.6* 2.5*    CBG: Recent Labs  Lab 08/04/21 1557 08/04/21 2111 08/05/21 0746 08/05/21 1127 08/05/21 1215  GLUCAP 131* 125* 152* 60* 84     Recent Results (from the past 240 hour(s))  Resp Panel by RT-PCR (Flu A&B, Covid) Nasopharyngeal Swab     Status: None   Collection Time: 07/28/21  3:14 PM   Specimen: Nasopharyngeal  Swab; Nasopharyngeal(NP) swabs in vial transport medium  Result Value Ref Range Status   SARS Coronavirus 2 by RT PCR NEGATIVE NEGATIVE Final    Comment: (NOTE) SARS-CoV-2 target nucleic acids are NOT DETECTED.  The SARS-CoV-2 RNA is generally detectable in upper respiratory specimens during the acute phase of infection. The lowest concentration of SARS-CoV-2 viral copies this assay can detect is 138 copies/mL. A negative result does not preclude SARS-Cov-2 infection and should not be used as the sole basis for treatment or other patient management decisions. A negative result may occur with  improper specimen collection/handling, submission of specimen other than nasopharyngeal swab, presence of viral mutation(s) within the areas targeted by this assay, and  inadequate number of viral copies(<138 copies/mL). A negative result must be combined with clinical observations, patient history, and epidemiological information. The expected result is Negative.  Fact Sheet for Patients:  EntrepreneurPulse.com.au  Fact Sheet for Healthcare Providers:  IncredibleEmployment.be  This test is no t yet approved or cleared by the [place] FDA and  has been authorized for detection and/or diagnosis of SARS-CoV-2 by FDA under an Emergency Use Authorization (EUA). This EUA will remain  in effect (meaning this test can be used) for the duration of the COVID-19 declaration under Section 564(b)(1) of the Act, 21 U.S.C.section 360bbb-3(b)(1), unless the authorization is terminated  or revoked sooner.       Influenza A by PCR NEGATIVE NEGATIVE Final   Influenza B by PCR NEGATIVE NEGATIVE Final    Comment: (NOTE) The Xpert Xpress SARS-CoV-2/FLU/RSV plus assay is intended as an aid in the diagnosis of influenza from Nasopharyngeal swab specimens and should not be used as a sole basis for treatment. Nasal washings and aspirates are unacceptable for Xpert Xpress SARS-CoV-2/FLU/RSV testing.  Fact Sheet for Patients: EntrepreneurPulse.com.au  Fact Sheet for Healthcare Providers: IncredibleEmployment.be  This test is not yet approved or cleared by the [place] FDA and has been authorized for detection and/or diagnosis of SARS-CoV-2 by FDA under an Emergency Use Authorization (EUA). This EUA will remain in effect (meaning this test can be used) for the duration of the COVID-19 declaration under Section 564(b)(1) of the Act, 21 U.S.C. section 360bbb-3(b)(1), unless the authorization is terminated or revoked.  Performed at [facility], [address] [place], [place] [address]   Resp Panel by RT-PCR (Flu A&B, Covid) Nasopharyngeal Swab     Status: None   Collection  Time: 08/02/21  1:41 PM   Specimen: Nasopharyngeal Swab; Nasopharyngeal(NP) swabs in vial transport medium  Result Value Ref Range Status   SARS Coronavirus 2 by RT PCR NEGATIVE NEGATIVE Final    Comment: (NOTE) SARS-CoV-2 target nucleic acids are NOT DETECTED.  The SARS-CoV-2 RNA is generally detectable in upper respiratory specimens during the acute phase of infection. The lowest concentration of SARS-CoV-2 viral copies this assay can detect is 138 copies/mL. A negative result does not preclude SARS-Cov-2 infection and should not be used as the sole basis for treatment or other patient management decisions. A negative result may occur with  improper specimen collection/handling, submission of specimen other than nasopharyngeal swab, presence of viral mutation(s) within the areas targeted by this assay, and inadequate number of viral copies(<138 copies/mL). A negative result must be combined with clinical observations, patient history, and epidemiological information. The expected result is Negative.  Fact Sheet for Patients:  EntrepreneurPulse.com.au  Fact Sheet for Healthcare Providers:  IncredibleEmployment.be  This test is no t yet approved or cleared by the [place]  FDA and  has been authorized for detection and/or diagnosis of SARS-CoV-2 by FDA under an Emergency Use Authorization (EUA). This EUA will remain  in effect (meaning this test can be used) for the duration of the COVID-19 declaration under Section 564(b)(1) of the Act, 21 U.S.C.section 360bbb-3(b)(1), unless the authorization is terminated  or revoked sooner.       Influenza A by PCR NEGATIVE NEGATIVE Final   Influenza B by PCR NEGATIVE NEGATIVE Final    Comment: (NOTE) The Xpert Xpress SARS-CoV-2/FLU/RSV plus assay is intended as an aid in the diagnosis of influenza from Nasopharyngeal swab specimens and should not be used as a sole basis for treatment. Nasal washings  and aspirates are unacceptable for Xpert Xpress SARS-CoV-2/FLU/RSV testing.  Fact Sheet for Patients: EntrepreneurPulse.com.au  Fact Sheet for Healthcare Providers: IncredibleEmployment.be  This test is not yet approved or cleared by the [place] FDA and has been authorized for detection and/or diagnosis of SARS-CoV-2 by FDA under an Emergency Use Authorization (EUA). This EUA will remain in effect (meaning this test can be used) for the duration of the COVID-19 declaration under Section 564(b)(1) of the Act, 21 U.S.C. section 360bbb-3(b)(1), unless the authorization is terminated or revoked.  Performed at [facility], [address] [place], [place] [address]   Blood culture (routine x 2)     Status: None (Preliminary result)   Collection Time: 08/02/21  1:49 PM   Specimen: BLOOD  Result Value Ref Range Status   Specimen Description BLOOD LW  Final   Special Requests BOTTLES DRAWN AEROBIC AND ANAEROBIC BCAV  Final   Culture   Final    NO GROWTH 3 DAYS Performed at [facility], [address] [place], [place] [address]    Report Status PENDING  Incomplete  Blood culture (routine x 2)     Status: None (Preliminary result)   Collection Time: 08/02/21  2:07 PM   Specimen: BLOOD  Result Value Ref Range Status   Specimen Description BLOOD [facility]  Final   Special Requests BOTTLES DRAWN AEROBIC AND ANAEROBIC BCAV  Final   Culture   Final    NO GROWTH 3 DAYS Performed at [facility], [address] [place], [place] [address]    Report Status PENDING  Incomplete         Radiology Studies: No results found.      Scheduled Meds:  aspirin EC  81 mg Oral q AM   diphenhydrAMINE  25 mg Oral QHS   docusate sodium  100 mg Oral BID   dronabinol  2.5 mg Oral BID AC   DULoxetine  40 mg Oral BID   gabapentin  100 mg Oral BID   heparin  5,000 Units Subcutaneous Q8H   insulin aspart  0-9 Units  Subcutaneous TID WC   insulin aspart  4 Units Subcutaneous TID PC   insulin glargine-yfgn  16 Units Subcutaneous Q2200   isosorbide dinitrate  30 mg Oral TID   methylPREDNISolone (SOLU-MEDROL) injection  50 mg Intravenous Q12H   metoprolol succinate  50 mg Oral Daily   multivitamin with minerals  1 tablet Oral q AM   pantoprazole  40 mg Oral Daily   Continuous Infusions:     LOS: 3 days    Time spent: 35 minutes    Irine Seal, MD [facility]   To contact the attending provider between 7A-7P or the covering provider during after hours 7P-7A, please log into the web site [email or web address] and  access using universal  password for that web site. If you do not have the password, please call the hospital operator.  08/05/2021, 4:06 PM

## 2021-08-05 NOTE — Progress Notes (Addendum)
Inpatient Diabetes Program Recommendations  AACE/ADA: New Consensus Statement on Inpatient Glycemic Control (2015)  Target Ranges:  Prepandial:   less than 140 mg/dL      Peak postprandial:   less than 180 mg/dL (1-2 hours)      Critically ill patients:  140 - 180 mg/dL   Lab Results  Component Value Date   GLUCAP 60 (L) 08/05/2021   HGBA1C 6.1 (H) 08/04/2021    Review of Glycemic Control  Latest Reference Range & Units 08/03/21 16:28 08/03/21 21:43 08/04/21 07:45 08/04/21 11:30 08/04/21 15:57 08/04/21 21:11 08/05/21 07:46 08/05/21 11:27  Glucose-Capillary 70 - 99 mg/dL 172 (H) 111 (H) 170 (H) 130 (H) 131 (H) 125 (H) 152 (H) 60 (L)  (H): Data is abnormally high (L): Data is abnormally low  Diabetes history: DM2 Outpatient Diabetes medications: Lantus 16 units, Humalog 8 units tid meal coverage, Jardiance 10 mg qd, Metformin 500 mg bid Current orders for Inpatient glycemic control: Semglee 16 units qd, Novolog 8 units tid meal coverage, Novolog 0-15 units tid correction, Solumedrol 50 q 12 hrs.  Inpatient Diabetes Program Recommendations:   Hypoglycemia today @ noon post meal coverage for breakfast. Please consider: -Decrease Novolog meal coverage to 4 units tid if eats 50% -Decrease Novolog correction to 0-9 units tid, 0-5 units hs  Thank you, Bethena Roys E. Travone Georg, RN, MSN, CDE  Diabetes Coordinator Inpatient Glycemic Control Team Team Pager [phone] (8am-5pm) 08/05/2021 12:17 PM

## 2021-08-05 NOTE — Progress Notes (Addendum)
Hematology/Oncology Progress note [phone] [phone]     Patient Care Team: [name], MD as PCP - General (Family Medicine) Earlie Server, MD as Consulting Physician (Hematology and Oncology)   Name of the patient: [name] [name]  [id number]  1955-11-11  Date of visit: 08/05/21   INTERVAL HISTORY-  Patient reports feeling well.  Nausea has improved.  Transaminitis is improving.    Allergies  Allergen Reactions   Lisinopril Swelling and Other (See Comments)    Recurrent AKI and hyperkalemia when initiation attempted.    Patient Active Problem List   Diagnosis Date Noted   Hypovolemic shock ([place]) 08/03/2021   AKI (acute kidney injury) ([place]) 08/03/2021   Transaminitis 08/02/2021   Chemotherapy-induced nausea 07/28/2021   Encounter for antineoplastic immunotherapy 07/07/2021   Macrocytic anemia 07/07/2021   Moderate protein-calorie malnutrition ([place]) 04/12/2021   Stage 3b chronic kidney disease ([place]) 04/12/2021   Normocytic anemia 04/12/2021   Dyslipidemia 04/05/2021   Encounter for antineoplastic chemotherapy 02/28/2021   Non-small cell cancer of left lung ([place]) 02/17/2021   Tinea pedis of both feet 02/10/2021   Chronic pain due to neoplasm 02/10/2021   Adenocarcinoma, lung, left ([place]) 01/19/2021   S/P Robotic Assisted Video Thoracoscopy with Left Lower Lobectomy Lung, Intercostal nerve block, lymph node dissection 01/17/2021   Non-small cell lung cancer ([place]) 12/30/2020   Depression 12/09/2020   Hyperlipidemia 12/09/2020   Primary hypertension 12/09/2020   Heart failure with reduced ejection fraction ([place]) 02/16/2020   Multiple subsegmental pulmonary emboli without acute cor pulmonale ([place]) 02/15/2020   Iron deficiency anemia 12/18/2019   Rectal pain 12/18/2019   Diabetic foot ulcer ([place]) 10/03/2019   Cellulitis 08/17/2019   Cocaine use 08/17/2019   Peripheral vascular disease ([place]) 08/17/2019   Tobacco use disorder 08/17/2019    Tobacco abuse, in remission 08/17/2019   Cervical dysplasia 04/02/2019   CAD (coronary artery disease), native coronary artery 06/06/2010   Type II diabetes mellitus ([place]) 06/06/2010   Acute subendocardial infarction, initial episode of care ([place]) 06/05/2010      Physical exam:  Vitals:   08/04/21 2054 08/05/21 0509 08/05/21 0744 08/05/21 1126  BP: (!) 158/98 (!) 151/82 (!) 155/87 (!) 152/88  Pulse: 78 72 76 77  Resp: 18 16 17 16   Temp: 98.1 F (36.7 C) 98.2 F (36.8 C) 97.9 F (36.6 C) 97.7 F (36.5 C)  TempSrc: Oral Oral Oral Oral  SpO2: 98% 100% 100% 100%  Weight:      Height:       Physical Exam Constitutional:      General: She is not in acute distress.    Appearance: She is not diaphoretic.  HENT:     Nose: Nose normal.     Mouth/Throat:     Pharynx: No oropharyngeal exudate.  Eyes:     General: No scleral icterus.    Pupils: Pupils are equal, round, and reactive to light.  Cardiovascular:     Rate and Rhythm: Normal rate and regular rhythm.     Heart sounds: No murmur heard. Pulmonary:     Effort: Pulmonary effort is normal. No respiratory distress.     Breath sounds: No rales.  Chest:     Chest wall: No tenderness.  Abdominal:     General: There is no distension.     Palpations: Abdomen is soft.     Tenderness: There is no abdominal tenderness.  Musculoskeletal:        General: Normal range of motion.  Cervical back: Normal range of motion and neck supple.  Skin:    General: Skin is warm and dry.     Findings: No erythema.  Neurological:     Mental Status: She is alert and oriented to person, place, and time.     Cranial Nerves: No cranial nerve deficit.     Motor: No abnormal muscle tone.     Coordination: Coordination normal.  Psychiatric:        Mood and Affect: Mood and affect normal.       CMP Latest Ref Rng & Units 08/05/2021  Glucose 70 - 99 mg/dL 181(H)  BUN 8 - 23 mg/dL 19  Creatinine 0.44 - 1.00 mg/dL 1.13(H)  Sodium 135 - 145  mmol/L 140  Potassium 3.5 - 5.1 mmol/L 4.2  Chloride 98 - 111 mmol/L 114(H)  CO2 22 - 32 mmol/L 23  Calcium 8.9 - 10.3 mg/dL 8.0(L)  Total Protein 6.5 - 8.1 g/dL 5.4(L)  Total Bilirubin 0.3 - 1.2 mg/dL 1.5(H)  Alkaline Phos 38 - 126 U/L 842(H)  AST 15 - 41 U/L 431(H)  ALT 0 - 44 U/L 451(H)   CBC Latest Ref Rng & Units 08/05/2021  WBC 4.0 - 10.5 K/uL 4.2  Hemoglobin 12.0 - 15.0 g/dL 8.3(L)  Hematocrit 36.0 - 46.0 % 25.4(L)  Platelets 150 - 400 K/uL 267    RADIOGRAPHIC STUDIES: I have personally reviewed the radiological images as listed and agreed with the findings in the report. US Venous Img Lower Bilateral  Result Date: 08/02/2021 CLINICAL DATA:  Shortness of breath EXAM: BILATERAL LOWER EXTREMITY VENOUS DOPPLER ULTRASOUND TECHNIQUE: Gray-scale sonography with graded compression, as well as color Doppler and duplex ultrasound were performed to evaluate the lower extremity deep venous systems from the level of the common femoral vein and including the common femoral, femoral, profunda femoral, popliteal and calf veins including the posterior tibial, peroneal and gastrocnemius veins when visible. The superficial great saphenous vein was also interrogated. Spectral Doppler was utilized to evaluate flow at rest and with distal augmentation maneuvers in the common femoral, femoral and popliteal veins. COMPARISON:  None. FINDINGS: RIGHT LOWER EXTREMITY Common Femoral Vein: No evidence of thrombus. Normal compressibility, respiratory phasicity and response to augmentation. Saphenofemoral Junction: No evidence of thrombus. Normal compressibility and flow on color Doppler imaging. Profunda Femoral Vein: No evidence of thrombus. Normal compressibility and flow on color Doppler imaging. Femoral Vein: No evidence of thrombus. Normal compressibility, respiratory phasicity and response to augmentation. Popliteal Vein: No evidence of thrombus. Normal compressibility, respiratory phasicity and response to  augmentation. Calf Veins: Prior below-knee amputation. Superficial Great Saphenous Vein: No evidence of thrombus. Normal compressibility. Venous Reflux:  None. Other Findings:  None. LEFT LOWER EXTREMITY Common Femoral Vein: No evidence of thrombus. Normal compressibility, respiratory phasicity and response to augmentation. Saphenofemoral Junction: No evidence of thrombus. Normal compressibility and flow on color Doppler imaging. Profunda Femoral Vein: No evidence of thrombus. Normal compressibility and flow on color Doppler imaging. Femoral Vein: No evidence of thrombus. Normal compressibility, respiratory phasicity and response to augmentation. Popliteal Vein: No evidence of thrombus. Normal compressibility, respiratory phasicity and response to augmentation. Calf Veins: No evidence of thrombus. Normal compressibility and flow on color Doppler imaging. Superficial Great Saphenous Vein: No evidence of thrombus. Normal compressibility. Venous Reflux:  None. Other Findings:  None. IMPRESSION: No evidence of deep venous thrombosis in either lower extremity. Electronically Signed   By: Maurine Simmering M.D.   On: 08/02/2021 15:37   DG Chest Portable  1 View  Result Date: 08/02/2021 CLINICAL DATA:  A 66 year old female presents for evaluation of shortness of breath. EXAM: PORTABLE CHEST 1 VIEW COMPARISON:  April 26, 2021. FINDINGS: RIGHT IJ Port-A-Cath with tip over the distal superior vena cava. EKG leads project over the chest. LEFT hemidiaphragm remains elevated. Distortion of cardiomediastinal contours related to this hemidiaphragmatic elevation with similar appearance to prior imaging. Heart size and mediastinal contours are normal accounting for this finding. No lobar consolidation. No sign of effusion or pneumothorax. On limited assessment there is no acute skeletal process. IMPRESSION: 1. Chronic elevation of the LEFT hemidiaphragm without acute cardiopulmonary disease. 2. RIGHT IJ Port-A-Cath terminating in the  area of the distal superior vena cava. Electronically Signed   By: Zetta Bills M.D.   On: 08/02/2021 14:52   IR IMAGING GUIDED PORT INSERTION  Result Date: 07/27/2021 INDICATION: History of lung cancer EXAM: Ultrasound-guided puncture of the right internal jugular vein Placement of a right-sided chest port using fluoroscopic guidance MEDICATIONS: None ANESTHESIA/SEDATION: Moderate (conscious) sedation was employed during this procedure. A total of Versed 1 mg and Fentanyl 50 mcg was administered intravenously. Moderate Sedation Time: 20 minutes. The patient's level of consciousness and vital signs were monitored continuously by radiology nursing throughout the procedure under my direct supervision. FLUOROSCOPY TIME:  Fluoroscopy Time: 0.3 minutes (2.5 mGy) COMPLICATIONS: None immediate. PROCEDURE: Informed written consent was obtained from the patient after a thorough discussion of the procedural risks, benefits and alternatives. All questions were addressed. Maximal Sterile Barrier Technique was utilized including caps, mask, sterile gowns, sterile gloves, sterile drape, hand hygiene and skin antiseptic. A timeout was performed prior to the initiation of the procedure. The patient was placed supine on the exam table. The right neck and chest was prepped and draped in the standard sterile fashion. A preliminary ultrasound of the right neck was performed and demonstrates a patent right internal jugular vein. A permanent ultrasound image was stored in the electronic medical record. The overlying skin was anesthetized with 1% Lidocaine. Using ultrasound guidance, access was obtained into the right internal jugular vein using a 21 gauge micropuncture set. A wire was advanced into the SVC, a short incision was made at the puncture site, and serial dilatation performed. Next, in an ipsilateral infraclavicular location, an incision was made at the site of the subcutaneous reservoir. Blunt dissection was used to open  a pocket to contain the reservoir. A subcutaneous tunnel was then created from the port site to the puncture site. A(n) 8 Fr single lumen catheter was advanced through the tunnel. The catheter was attached to the port and this was placed in the subcutaneous pocket. Under fluoroscopic guidance, a peel away sheath was placed, and the catheter was trimmed to the appropriate length and was advanced into the central veins. The catheter length is 22 cm. The tip of the catheter lies near the superior cavoatrial junction. The port flushes and aspirates appropriately. The port was flushed and locked with heparinized saline. The port pocket was closed in 2 layers using 3-0 and 4-0 Vicryl/absorbable suture. Dermabond was also applied to both incisions. The patient tolerated the procedure well and was transferred to recovery in stable condition. IMPRESSION: Successful placement of a right chest port via the right internal jugular vein. The port is ready for immediate use. Electronically Signed   By: Albin Felling M.D.   On: 07/27/2021 12:30   US Abdomen Limited RUQ (LIVER/GB)  Result Date: 08/03/2021 CLINICAL DATA:  Transaminitis EXAM: ULTRASOUND ABDOMEN LIMITED  RIGHT UPPER QUADRANT COMPARISON:  07/28/2021 FINDINGS: Gallbladder: Status post cholecystectomy Common bile duct: Diameter: 3 mm Liver: No focal lesion identified. Within normal limits in parenchymal echogenicity. Portal vein is patent on color Doppler imaging with normal direction of blood flow towards the liver. Other: None. IMPRESSION: Normal sonographic appearance of the liver. Electronically Signed   By: Miachel Roux M.D.   On: 08/03/2021 08:37   US ABDOMEN LIMITED RUQ (LIVER/GB)  Result Date: 07/28/2021 CLINICAL DATA:  Elevated liver enzymes. EXAM: ULTRASOUND ABDOMEN LIMITED RIGHT UPPER QUADRANT COMPARISON:  November 17, 2020. FINDINGS: Gallbladder: Patient is post cholecystectomy as evidenced on prior CT imaging. Common bile duct: Diameter: 5.4 mm Liver: No  focal lesion identified. Within normal limits in parenchymal echogenicity. Portal vein is patent on color Doppler imaging with normal direction of blood flow towards the liver. Other: None. IMPRESSION: Post cholecystectomy without signs of biliary duct distension. No parenchymal abnormalities noted in the liver. Electronically Signed   By: Zetta Bills M.D.   On: 07/28/2021 15:13    Assessment and plan-   #Acute transaminitis, hyperbilirubinemia, secondary to immunotherapy, Grade 4 toxicity Transaminitis and bilirubin continues to improve. Continue 1 mg/kilogram Solu-Medrol BID.  Trend LFT daily.  Once her transaminitis levels improved to be less than 5 x upper normal limits { AST<200, ALT < 220], ok to transition to prednisone 60mg  daily at discharge  She will follow up with me outpatient and I will manage her to taper steroid down  Cancer center office will call her to arrange appt next week.  Please discharge her on PPI for GI prophylaxis.   # AKI, creatinine is trending down.  # Non small cell lung cancer, outpatient follow up  Thank you for allowing me to participate in the care of this patient.   Earlie Server, MD, PhD Hematology Oncology [facility] at [facility] Pager- [phone] 08/05/2021

## 2021-08-05 NOTE — Plan of Care (Signed)

## 2021-08-05 NOTE — Progress Notes (Signed)
Physical Therapy Treatment Patient Details Name: [name] [name] MRN: [id number] DOB: 09-15-1955 Today's Date: 08/05/2021   History of Present Illness 66 year old female with PMHx of CAD, CHF, CKD, lung cancer, insulin-dependent type 2 diabetes, HTN, HLD, hx of right BKA, PAD, hx DVT and PE (01/2020), NSTEMI (2012), and depression presented from oncologist office due to transaminitis, hypotension, tachycardia, poor oral intake.    PT Comments    Pt demonstrated ability to ambulate in room without device and occasional assistance from furniture for balance. Pt declined RW for use during gait training in hallway 261ft with CGA for safety since pt tends to be easily distracted and miss step. No LOB noted. Pt appears to be regaining strength though tolerance for higher level balance activities in standing. Pt completed stair training safely with CG/SBA.  Continue PT as an outpatient.   Recommendations for follow up therapy are one component of a multi-disciplinary discharge planning process, led by the attending physician.  Recommendations may be updated based on patient status, additional functional criteria and insurance authorization.  Follow Up Recommendations  Outpatient PT     Assistance Recommended at Discharge Intermittent Supervision/Assistance  Patient can return home with the following A little help with walking and/or transfers;A little help with bathing/dressing/bathroom;Assistance with cooking/housework;Assist for transportation;Help with stairs or ramp for entrance   Equipment Recommendations  None recommended by PT    Recommendations for Other Services       Precautions / Restrictions Precautions Precautions: Fall Precaution Comments: hx R BKA, has prosthesis here Restrictions Weight Bearing Restrictions: No     Mobility  Bed Mobility Overal bed mobility: Independent                  Transfers Overall transfer level: Needs assistance Equipment used:  None Transfers: Sit to/from Stand Sit to Stand: Modified independent (Device/Increase time)           General transfer comment:  (Pt independent with use of bathroom)    Ambulation/Gait Ambulation/Gait assistance: Min guard Gait Distance (Feet):  (200) Assistive device: None Gait Pattern/deviations: Drifts right/left       General Gait Details: Pt unsteady at times without LOB when distracted   Stairs Stairs: Yes Stairs assistance: Min guard Stair Management: One rail Left Number of Stairs: 4 General stair comments:  (Able to demonstrate proper sequence without cues)   Wheelchair Mobility    Modified Rankin (Stroke Patients Only)       Balance Overall balance assessment: History of Falls, Needs assistance Sitting-balance support: No upper extremity supported, Feet supported Sitting balance-Leahy Scale: Normal     Standing balance support: No upper extremity supported Standing balance-Leahy Scale: Fair Standing balance comment: mildly unsteady, able to self correct slight LOBs             High level balance activites: Side stepping, Direction changes, Head turns, Other (comment) (Reaching out of BOS and down towards floor level) High Level Balance Comments:  (No LOB noted)            Cognition Arousal/Alertness: Awake/alert Behavior During Therapy: WFL for tasks assessed/performed Overall Cognitive Status: Within Functional Limits for tasks assessed                                          Exercises      General Comments        Pertinent Vitals/Pain Pain Assessment Pain  Assessment: No/denies pain    Home Living                          Prior Function            PT Goals (current goals can now be found in the care plan section) Acute Rehab PT Goals Patient Stated Goal: improve symptoms to return home    Frequency    Min 2X/week      PT Plan Current plan remains appropriate    Co-evaluation               AM-PAC PT "6 Clicks" Mobility   Outcome Measure  Help needed turning from your back to your side while in a flat bed without using bedrails?: None Help needed moving from lying on your back to sitting on the side of a flat bed without using bedrails?: None Help needed moving to and from a bed to a chair (including a wheelchair)?: None Help needed standing up from a chair using your arms (e.g., wheelchair or bedside chair)?: None Help needed to walk in hospital room?: A Little Help needed climbing 3-5 steps with a railing? : A Little 6 Click Score: 22    End of Session Equipment Utilized During Treatment: Gait belt Activity Tolerance: Patient tolerated treatment well;No increased pain Patient left: in chair;with call bell/phone within reach Nurse Communication: Mobility status PT Visit Diagnosis: Unsteadiness on feet (R26.81);Other abnormalities of gait and mobility (R26.89);History of falling (Z91.81);Muscle weakness (generalized) (M62.81)     Time: 4801-6553 PT Time Calculation (min) (ACUTE ONLY): 45 min  Charges:  $Gait Training: 23-37 mins $Neuromuscular Re-education: 8-22 mins                    [name] [name], PTA    [name] [name] 08/05/2021, 4:38 PM

## 2021-08-06 DIAGNOSIS — C3490 Malignant neoplasm of unspecified part of unspecified bronchus or lung: Secondary | ICD-10-CM | POA: Diagnosis not present

## 2021-08-06 DIAGNOSIS — E785 Hyperlipidemia, unspecified: Secondary | ICD-10-CM | POA: Diagnosis not present

## 2021-08-06 DIAGNOSIS — E440 Moderate protein-calorie malnutrition: Secondary | ICD-10-CM | POA: Diagnosis not present

## 2021-08-06 DIAGNOSIS — I10 Essential (primary) hypertension: Secondary | ICD-10-CM | POA: Diagnosis not present

## 2021-08-06 DIAGNOSIS — R571 Hypovolemic shock: Secondary | ICD-10-CM | POA: Diagnosis not present

## 2021-08-06 DIAGNOSIS — I5020 Unspecified systolic (congestive) heart failure: Secondary | ICD-10-CM | POA: Diagnosis not present

## 2021-08-06 DIAGNOSIS — N179 Acute kidney failure, unspecified: Secondary | ICD-10-CM | POA: Diagnosis not present

## 2021-08-06 DIAGNOSIS — D649 Anemia, unspecified: Secondary | ICD-10-CM | POA: Diagnosis not present

## 2021-08-06 DIAGNOSIS — R7401 Elevation of levels of liver transaminase levels: Secondary | ICD-10-CM | POA: Diagnosis not present

## 2021-08-06 DIAGNOSIS — N1832 Chronic kidney disease, stage 3b: Secondary | ICD-10-CM | POA: Diagnosis not present

## 2021-08-06 DIAGNOSIS — F329 Major depressive disorder, single episode, unspecified: Secondary | ICD-10-CM | POA: Diagnosis not present

## 2021-08-06 DIAGNOSIS — D509 Iron deficiency anemia, unspecified: Secondary | ICD-10-CM | POA: Diagnosis not present

## 2021-08-06 LAB — COMPREHENSIVE METABOLIC PANEL
ALT: 423 U/L — ABNORMAL HIGH (ref 0–44)
AST: 326 U/L — ABNORMAL HIGH (ref 15–41)
Albumin: 2.6 g/dL — ABNORMAL LOW (ref 3.5–5.0)
Alkaline Phosphatase: 818 U/L — ABNORMAL HIGH (ref 38–126)
Anion gap: 6 (ref 5–15)
BUN: 19 mg/dL (ref 8–23)
CO2: 23 mmol/L (ref 22–32)
Calcium: 8.3 mg/dL — ABNORMAL LOW (ref 8.9–10.3)
Chloride: 109 mmol/L (ref 98–111)
Creatinine, Ser: 1.12 mg/dL — ABNORMAL HIGH (ref 0.44–1.00)
GFR, Estimated: 55 mL/min — ABNORMAL LOW (ref >60)
Glucose, Bld: 206 mg/dL — ABNORMAL HIGH (ref 70–99)
Potassium: 4.4 mmol/L (ref 3.5–5.1)
Sodium: 138 mmol/L (ref 135–145)
Total Bilirubin: 1.4 mg/dL — ABNORMAL HIGH (ref 0.3–1.2)
Total Protein: 5.7 g/dL — ABNORMAL LOW (ref 6.5–8.1)

## 2021-08-06 LAB — URINALYSIS, COMPLETE (UACMP) WITH MICROSCOPIC
Bilirubin Urine: NEGATIVE
Glucose, UA: >=500 mg/dL — AB
Hgb urine dipstick: NEGATIVE
Ketones, ur: NEGATIVE mg/dL
Leukocytes,Ua: NEGATIVE
Nitrite: NEGATIVE
Protein, ur: >=300 mg/dL — AB
Specific Gravity, Urine: 1.015 (ref 1.005–1.030)
pH: 6 (ref 5.0–8.0)

## 2021-08-06 LAB — CBC
HCT: 24.8 % — ABNORMAL LOW (ref 36.0–46.0)
Hemoglobin: 8.1 g/dL — ABNORMAL LOW (ref 12.0–15.0)
MCH: 31.6 pg (ref 26.0–34.0)
MCHC: 32.7 g/dL (ref 30.0–36.0)
MCV: 96.9 fL (ref 80.0–100.0)
Platelets: 241 10*3/uL (ref 150–400)
RBC: 2.56 MIL/uL — ABNORMAL LOW (ref 3.87–5.11)
RDW: 15.6 % — ABNORMAL HIGH (ref 11.5–15.5)
WBC: 4.2 10*3/uL (ref 4.0–10.5)
nRBC: 0 % (ref 0.0–0.2)

## 2021-08-06 LAB — GLUCOSE, CAPILLARY
Glucose-Capillary: 170 mg/dL — ABNORMAL HIGH (ref 70–99)
Glucose-Capillary: 252 mg/dL — ABNORMAL HIGH (ref 70–99)
Glucose-Capillary: 223 mg/dL — ABNORMAL HIGH (ref 70–99)
Glucose-Capillary: 237 mg/dL — ABNORMAL HIGH (ref 70–99)

## 2021-08-06 LAB — MAGNESIUM: Magnesium: 2.3 mg/dL (ref 1.7–2.4)

## 2021-08-06 MED ORDER — INSULIN GLARGINE-YFGN 100 UNIT/ML ~~LOC~~ SOLN
16.0000 [IU] | Freq: Every day | SUBCUTANEOUS | Status: DC
  Administered 2021-08-06: 16 [IU] via SUBCUTANEOUS
  Filled 2021-08-06 (×2): qty 0.16

## 2021-08-06 NOTE — TOC Initial Note (Signed)
Transition of Care [facility]) - Initial/Assessment Note    Patient Details  Name: [name] [name] MRN: [id number] Date of Birth: 12-25-1955  [facility]) CM/SW Contact:    Magnus Ivan, LCSW Phone Number: 08/06/2021, 3:55 PM  Clinical Narrative:         Spoke with patient regarding DC planning.  Patient lives with her cousin Earlie Server.  Patient's PCP is Dr. Ronnald Ramp. [facility]acy [facility]. Patient goes to the [facility]. Patient is agreeable to recommendation for OPPT, would like to use [facility]. CSW is sending referral.  Patient asked if the [facility] can set up transportation to and from [place] for her, said she was previously told they can do this. CSW sent message to [place] to inquire about this.            Expected Discharge Plan: OP Rehab Barriers to Discharge: Continued Medical Work up   Patient Goals and CMS Choice Patient states their goals for this hospitalization and ongoing recovery are:: OP rehab CMS Medicare.gov Compare Post Acute Care list provided to:: Patient Choice offered to / list presented to : Patient  Expected Discharge Plan and Services Expected Discharge Plan: OP Rehab       Living arrangements for the past 2 months: Single Family Home                                      Prior Living Arrangements/Services Living arrangements for the past 2 months: Single Family Home Lives with:: Relatives Patient language and need for interpreter reviewed:: Yes Do you feel safe going back to the place where you live?: Yes      Need for Family Participation in Patient Care: Yes (Comment) Care giver support system in place?: Yes (comment)   Criminal Activity/Legal Involvement Pertinent to Current Situation/Hospitalization: No - Comment as needed  Activities of Daily Living Home Assistive Devices/Equipment: Prosthesis ADL Screening (condition at time of admission) Patient's cognitive ability adequate to safely  complete daily activities?: Yes Is the patient deaf or have difficulty hearing?: No Does the patient have difficulty seeing, even when wearing glasses/contacts?: No Does the patient have difficulty concentrating, remembering, or making decisions?: No Patient able to express need for assistance with ADLs?: Yes Does the patient have difficulty dressing or bathing?: No Independently performs ADLs?: Yes (appropriate for developmental age) Does the patient have difficulty walking or climbing stairs?: No Weakness of Legs: None Weakness of Arms/Hands: None  Permission Sought/Granted Permission sought to share information with : Facility Art therapist granted to share information with : Yes, Verbal Permission Granted     Permission granted to share info w AGENCY: [place]        Emotional Assessment       Orientation: : Oriented to Self, Oriented to Place, Oriented to  Time, Oriented to Situation Alcohol / Substance Use: Not Applicable Psych Involvement: No (comment)  Admission diagnosis:  Weakness [R53.1] Transaminitis [R74.01] Liver function test abnormality [R79.89] AKI (acute kidney injury) ([place]) [N17.9] Patient Active Problem List   Diagnosis Date Noted   Liver function test abnormality    Long term systemic steroid user    Hypovolemic shock ([place]) 08/03/2021   AKI (acute kidney injury) ([place]) 08/03/2021   Transaminitis 08/02/2021   Chemotherapy-induced nausea 07/28/2021   Encounter for antineoplastic immunotherapy 07/07/2021   Macrocytic anemia 07/07/2021   Moderate  protein-calorie malnutrition ([place]) 04/12/2021   Stage 3b chronic kidney disease ([place]) 04/12/2021   Normocytic anemia 04/12/2021   Dyslipidemia 04/05/2021   Encounter for antineoplastic chemotherapy 02/28/2021   Non-small cell cancer of left lung ([place]) 02/17/2021   Tinea pedis of both feet 02/10/2021   Chronic pain due to neoplasm 02/10/2021   Adenocarcinoma, lung, left ([place])  01/19/2021   S/P Robotic Assisted Video Thoracoscopy with Left Lower Lobectomy Lung, Intercostal nerve block, lymph node dissection 01/17/2021   Non-small cell lung cancer ([place]) 12/30/2020   Depression 12/09/2020   Hyperlipidemia 12/09/2020   Primary hypertension 12/09/2020   Heart failure with reduced ejection fraction ([employer]) 02/16/2020   Multiple subsegmental pulmonary emboli without acute cor pulmonale ([place]) 02/15/2020   Iron deficiency anemia 12/18/2019   Rectal pain 12/18/2019   Diabetic foot ulcer ([place]) 10/03/2019   Cellulitis 08/17/2019   Cocaine use 08/17/2019   Peripheral vascular disease ([place]) 08/17/2019   Tobacco use disorder 08/17/2019   Tobacco abuse, in remission 08/17/2019   Cervical dysplasia 04/02/2019   CAD (coronary artery disease), native coronary artery 06/06/2010   Type II diabetes mellitus ([place]) 06/06/2010   Acute subendocardial infarction, initial episode of care ([place]) 06/05/2010   PCP:  [name], MD Pharmacy[id number] [place] [place] [address] Phone: [phone] Fax: [phone]     Social Determinants of Health (SDOH) Interventions    Readmission Risk Interventions Readmission Risk Prevention Plan 08/06/2021  Transportation Screening Complete  Medication Review (RN Care Manager) Complete  PCP or Specialist appointment within 3-5 days of discharge Complete  HRI or Home Care Consult Complete  SW Recovery Care/Counseling Consult Complete  Palliative Care Screening Complete  Skilled Nursing Facility Complete  Some recent data might be hidden

## 2021-08-06 NOTE — Progress Notes (Signed)
PROGRESS NOTE    [name] [name]  CHE:[id number] DOB: 1956/04/08 DOA: 08/02/2021 PCP: [name], MD    Chief Complaint  Patient presents with   Hypotension    Brief Narrative:  Patient is a pleasant unfortunate 66 year old female history of CAD, CHF, CKD, lung cancer, insulin-dependent type 2 diabetes, hypertension hyperlipidemia history of right BKA, PAD, depression presented from oncologist office due to transaminitis, hypotension, tachycardia, poor oral intake.  Initial EKG concerning for ST elevations in leads III, aVF ED provider spoke with cardiology who felt not a STEMI recommended IV fluids with repeat EKG showing resolution of ST elevation.  Patient noted with lung cancer status postsurgical resection, adjuvant chemotherapy, status post 1 dose immunotherapy.  Concern patient's immunotherapy may have led to severe transaminitis.  Oncology following and recommended IV Solu-Medrol which patient was started on.    Assessment & Plan:   Assessment and Plan: * Transaminitis- (present on admission) - Felt likely secondary to immunotherapy. -Patient noted to have had ultrasound done 07/28/2021 with no biliary obstruction. -Repeat right upper quadrant ultrasound unremarkable.. -Statin on hold.  -LFTs trending down -Oncology consulted recommended Solu-Medrol 1 mg/kg daily which patient was started on and dose adjusted to twice daily per oncology recommendations with goal to be less than 5 times upper limits of normal (AST <200, ALT < 220) and bili < 2, then subsequently transition to prednisone 60 mg on discharge with outpatient follow-up with oncology for further taper.  -Supportive care.    Hypovolemic shock ([place])- (present on admission) - Hypovolemic shock felt likely secondary to volume depletion. -Blood pressure responded with IV fluids.   -No source of infection noted.   -IV fluids saline locked.  AKI (acute kidney injury) ([place])- (present on admission) - Secondary to  prerenal azotemia secondary to volume depletion in the setting of hypovolemic shock. -Aldactone held. -Blood pressure improved with hydration. -Urinalysis with trace leukocytes, nitrite negative, 0-5 WBCs. -Renal function trending down. -Saline lock IV fluids..  Normocytic anemia- (present on admission) - Hemoglobin stable at 8.1 -Follow H&H. -No signs of bleeding -Transfusion threshold hemoglobin < 7.   Stage 3b chronic kidney disease ([place])- (present on admission) - Creatinine elevated from baseline. -Renal function improved with hydration.   -IV fluids saline locked.  -See AKI.  Moderate protein-calorie malnutrition ([place])- (present on admission) - Secondary to non-small cell lung cancer. -Nutritional supplementation.  Non-small cell lung cancer ([place])- (present on admission) - Being followed by oncology.   -Outpatient follow-up with oncology -  Primary hypertension- (present on admission) - Patient noted to be in hypovolemic shock on admission which responded to IV fluids. -BP improved. -Continue Isordil and Toprol-XL.   -Saline lock IV fluids.    Hyperlipidemia- (present on admission) - Continue to hold statin secondary to transaminitis  Depression- (present on admission) - Continue Cymbalta.  Type II diabetes mellitus ([facility]) - Hemoglobin A1c 7.8 (01/25/2021) -Repeat hemoglobin A1c 6.1 (08/04/2021). -CBG 60 on 08/05/2021.  -CBG currently at 170 this morning.  -Elevated CBG likely secondary to steroids.  -Increase Semglee back to 16 units daily.  -Continue NovoLog meal coverage 4 units 3 times daily with meals. -Continue sensitive sliding scale insulin.  -Outpatient follow-up.   Tobacco use disorder- (present on admission) - Tobacco cessation.  Heart failure with reduced ejection fraction ([place])- (present on admission) - Stable. -Continue Isordil, Toprol-XL -Continue to hold Aldactone and could potentially resume in 1 to 2 days if renal function continues to remain  stable.  DVT prophylaxis: Heparin Code Status: Full Family Communication: Updated patient, no family at bedside. Disposition:   Status is: Inpatient Remains inpatient appropriate because: Severity of illness           Consultants:  Oncology: Dr.Yu  Procedures:  Right upper quadrant ultrasound 08/03/2021 Chest x-ray 08/02/2021 Lower extremity Dopplers 08/02/2021    Antimicrobials:  None   Subjective: -Sitting up in chair.  No chest pain.  No shortness of breath.  No abdominal pain.  Bout of nausea which is since resolved.  No emesis.  Overall feeling better.  Complaining of malodorous urine.  States she is eating like a horse.    Objective: Vitals:   08/05/21 2206 08/06/21 0412 08/06/21 0750 08/06/21 1140  BP: (!) 151/87 139/81 (!) 152/86 134/82  Pulse: 72 75 71 74  Resp: 15 16 18 18   Temp: 98.1 F (36.7 C) 98.2 F (36.8 C) 97.8 F (36.6 C) 97.8 F (36.6 C)  TempSrc: Oral Oral Oral Oral  SpO2: 100% 99% 100% 100%  Weight:      Height:        Intake/Output Summary (Last 24 hours) at 08/06/2021 1512 Last data filed at 08/06/2021 1425 Gross per 24 hour  Intake 720 ml  Output --  Net 720 ml   Filed Weights   08/02/21 1228  Weight: 51.7 kg    Examination:  General exam: NAD. Respiratory system: CTA B.  No wheezes, no crackles, no rhonchi.  Normal respiratory effort.  No use of accessory muscles of respiration.  Speaking in full sentences.   Cardiovascular system: Regular rate and rhythm no murmurs rubs or gallops.  No JVD.  No lower extremity edema. Gastrointestinal system: Abdomen is soft, nontender, nondistended, positive bowel sounds.  No rebound.  No guarding.   Central nervous system: Alert and oriented. No focal neurological deficits. Extremities: Symmetric 5 x 5 power.  Status post right BKA. Skin: No rashes, lesions or ulcers Psychiatry: Judgement and insight appear normal. Mood & affect appropriate.     Data Reviewed:    CBC: Recent Labs  Lab 08/02/21 1108 08/03/21 0430 08/04/21 0356 08/05/21 0413 08/06/21 0645  WBC 4.7 4.2 5.1 4.2 4.2  NEUTROABS 3.1  --  4.4  --   --   HGB 9.6* 7.9* 8.6* 8.3* 8.1*  HCT 29.4* 24.2* 25.9* 25.4* 24.8*  MCV 95.5 94.9 94.5 95.1 96.9  PLT 285 256 285 267 017    Basic Metabolic Panel: Recent Labs  Lab 08/02/21 1108 08/03/21 0430 08/04/21 0356 08/05/21 0413 08/06/21 0645  NA 134* 138 140 140 138  K 3.9 4.1 4.0 4.2 4.4  CL 102 112* 114* 114* 109  CO2 20* 20* 20* 23 23  GLUCOSE 140* 117* 199* 181* 206*  BUN 41* 40* 29* 19 19  CREATININE 2.18* 2.10* 1.46* 1.13* 1.12*  CALCIUM 8.7* 7.8* 8.1* 8.0* 8.3*  MG  --   --  2.2 2.2 2.3    GFR: Estimated Creatinine Clearance: 42 mL/min (A) (by C-G formula based on SCr of 1.12 mg/dL (H)).  Liver Function Tests: Recent Labs  Lab 08/02/21 1108 08/03/21 0430 08/04/21 0356 08/05/21 0413 08/06/21 0645  AST 1,230* 930* 679* 431* 326*  ALT 747* 620* 559* 451* 423*  ALKPHOS 983* 895* 902* 842* 818*  BILITOT 2.7* 2.5* 2.4* 1.5* 1.4*  PROT 6.5 5.4* 5.9* 5.4* 5.7*  ALBUMIN 3.0* 2.6* 2.6* 2.5* 2.6*    CBG: Recent Labs  Lab 08/05/21 1215 08/05/21 1700 08/05/21 2240 08/06/21 0752 08/06/21 1140  GLUCAP 84 213* 235* 170* 252*     Recent Results (from the past 240 hour(s))  Resp Panel by RT-PCR (Flu A&B, Covid) Nasopharyngeal Swab     Status: None   Collection Time: 07/28/21  3:14 PM   Specimen: Nasopharyngeal Swab; Nasopharyngeal(NP) swabs in vial transport medium  Result Value Ref Range Status   SARS Coronavirus 2 by RT PCR NEGATIVE NEGATIVE Final    Comment: (NOTE) SARS-CoV-2 target nucleic acids are NOT DETECTED.  The SARS-CoV-2 RNA is generally detectable in upper respiratory specimens during the acute phase of infection. The lowest concentration of SARS-CoV-2 viral copies this assay can detect is 138 copies/mL. A negative result does not preclude SARS-Cov-2 infection and should not be used as the  sole basis for treatment or other patient management decisions. A negative result may occur with  improper specimen collection/handling, submission of specimen other than nasopharyngeal swab, presence of viral mutation(s) within the areas targeted by this assay, and inadequate number of viral copies(<138 copies/mL). A negative result must be combined with clinical observations, patient history, and epidemiological information. The expected result is Negative.  Fact Sheet for Patients:  EntrepreneurPulse.com.au  Fact Sheet for Healthcare Providers:  IncredibleEmployment.be  This test is no t yet approved or cleared by the [place] FDA and  has been authorized for detection and/or diagnosis of SARS-CoV-2 by FDA under an Emergency Use Authorization (EUA). This EUA will remain  in effect (meaning this test can be used) for the duration of the COVID-19 declaration under Section 564(b)(1) of the Act, 21 U.S.C.section 360bbb-3(b)(1), unless the authorization is terminated  or revoked sooner.       Influenza A by PCR NEGATIVE NEGATIVE Final   Influenza B by PCR NEGATIVE NEGATIVE Final    Comment: (NOTE) The Xpert Xpress SARS-CoV-2/FLU/RSV plus assay is intended as an aid in the diagnosis of influenza from Nasopharyngeal swab specimens and should not be used as a sole basis for treatment. Nasal washings and aspirates are unacceptable for Xpert Xpress SARS-CoV-2/FLU/RSV testing.  Fact Sheet for Patients: EntrepreneurPulse.com.au  Fact Sheet for Healthcare Providers: IncredibleEmployment.be  This test is not yet approved or cleared by the [place] FDA and has been authorized for detection and/or diagnosis of SARS-CoV-2 by FDA under an Emergency Use Authorization (EUA). This EUA will remain in effect (meaning this test can be used) for the duration of the COVID-19 declaration under Section 564(b)(1) of the  Act, 21 U.S.C. section 360bbb-3(b)(1), unless the authorization is terminated or revoked.  Performed at [facility], [address] [place],  [address]   Resp Panel by RT-PCR (Flu A&B, Covid) Nasopharyngeal Swab     Status: None   Collection Time: 08/02/21  1:41 PM   Specimen: Nasopharyngeal Swab; Nasopharyngeal(NP) swabs in vial transport medium  Result Value Ref Range Status   SARS Coronavirus 2 by RT PCR NEGATIVE NEGATIVE Final    Comment: (NOTE) SARS-CoV-2 target nucleic acids are NOT DETECTED.  The SARS-CoV-2 RNA is generally detectable in upper respiratory specimens during the acute phase of infection. The lowest concentration of SARS-CoV-2 viral copies this assay can detect is 138 copies/mL. A negative result does not preclude SARS-Cov-2 infection and should not be used as the sole basis for treatment or other patient management decisions. A negative result may occur with  improper specimen collection/handling, submission of specimen other than nasopharyngeal swab, presence of viral mutation(s) within the areas targeted by this assay, and inadequate number of viral copies(<138 copies/mL). A negative result  must be combined with clinical observations, patient history, and epidemiological information. The expected result is Negative.  Fact Sheet for Patients:  EntrepreneurPulse.com.au  Fact Sheet for Healthcare Providers:  IncredibleEmployment.be  This test is no t yet approved or cleared by the [place] FDA and  has been authorized for detection and/or diagnosis of SARS-CoV-2 by FDA under an Emergency Use Authorization (EUA). This EUA will remain  in effect (meaning this test can be used) for the duration of the COVID-19 declaration under Section 564(b)(1) of the Act, 21 U.S.C.section 360bbb-3(b)(1), unless the authorization is terminated  or revoked sooner.       Influenza A by PCR NEGATIVE NEGATIVE Final    Influenza B by PCR NEGATIVE NEGATIVE Final    Comment: (NOTE) The Xpert Xpress SARS-CoV-2/FLU/RSV plus assay is intended as an aid in the diagnosis of influenza from Nasopharyngeal swab specimens and should not be used as a sole basis for treatment. Nasal washings and aspirates are unacceptable for Xpert Xpress SARS-CoV-2/FLU/RSV testing.  Fact Sheet for Patients: EntrepreneurPulse.com.au  Fact Sheet for Healthcare Providers: IncredibleEmployment.be  This test is not yet approved or cleared by the [place] FDA and has been authorized for detection and/or diagnosis of SARS-CoV-2 by FDA under an Emergency Use Authorization (EUA). This EUA will remain in effect (meaning this test can be used) for the duration of the COVID-19 declaration under Section 564(b)(1) of the Act, 21 U.S.C. section 360bbb-3(b)(1), unless the authorization is terminated or revoked.  Performed at [facility], [address] [place], [place] [address]   Blood culture (routine x 2)     Status: None (Preliminary result)   Collection Time: 08/02/21  1:49 PM   Specimen: BLOOD  Result Value Ref Range Status   Specimen Description BLOOD LW  Final   Special Requests BOTTLES DRAWN AEROBIC AND ANAEROBIC BCAV  Final   Culture   Final    NO GROWTH 4 DAYS Performed at [facility], [address], [place], [place] [address]    Report Status PENDING  Incomplete  Blood culture (routine x 2)     Status: None (Preliminary result)   Collection Time: 08/02/21  2:07 PM   Specimen: BLOOD  Result Value Ref Range Status   Specimen Description BLOOD BRH  Final   Special Requests BOTTLES DRAWN AEROBIC AND ANAEROBIC BCAV  Final   Culture   Final    NO GROWTH 4 DAYS Performed at [facility], [address], [place], [place] [address]    Report Status PENDING  Incomplete         Radiology Studies: No results found.      Scheduled Meds:   aspirin EC  81 mg Oral q AM   diphenhydrAMINE  25 mg Oral QHS   docusate sodium  100 mg Oral BID   dronabinol  2.5 mg Oral BID AC   DULoxetine  40 mg Oral BID   gabapentin  100 mg Oral BID   heparin  5,000 Units Subcutaneous Q8H   insulin aspart  0-9 Units Subcutaneous TID WC   insulin aspart  4 Units Subcutaneous TID PC   insulin glargine-yfgn  14 Units Subcutaneous Q2200   isosorbide dinitrate  30 mg Oral TID   methylPREDNISolone (SOLU-MEDROL) injection  50 mg Intravenous Q12H   metoprolol succinate  50 mg Oral Daily   multivitamin with minerals  1 tablet Oral q AM   pantoprazole  40 mg Oral Daily   Continuous Infusions:  LOS: 4 days    Time spent: 35 minutes    Irine Seal, MD [facility]   To contact the attending provider between 7A-7P or the covering provider during after hours 7P-7A, please log into the web site [email or web address] and access using universal [place] password for that web site. If you do not have the password, please call the hospital operator.  08/06/2021, 3:12 PM

## 2021-08-06 NOTE — TOC CM/SW Note (Signed)
Occupational Therapy * Physical Therapy * Speech Therapy        DATE: 08/06/21  PATIENT NAME: [name] [name] PATIENT MRN: [id number]  DIAGNOSIS/DIAGNOSIS CODE:  Transaminitis R74.01   DATE OF DISCHARGE: TBD  PRIMARY CARE PHYSICIAN: Dr. Ronnald Ramp PCP PHONE/FAX: [phone]   Dear Provider (Name: [facility] / Fax: [phone]):   I certify that I have examined this patient and that occupational/physical/speech therapy is necessary on an outpatient basis.    The patient has expressed interest in completing their recommended course of therapy at your location.  Once a formal order from the patient's primary care physician has been obtained, please contact him/her to schedule an appointment for evaluation at your earliest convenience.   [ X ]  Physical Therapy Evaluate and Treat     The patient's primary care physician (listed above) must furnish and be responsible for a formal order such that the recommended services may be furnished while under the primary physician's care, and that the plan of care will be established and reviewed every 30 days (or more often if condition necessitates).

## 2021-08-07 DIAGNOSIS — N1832 Chronic kidney disease, stage 3b: Secondary | ICD-10-CM | POA: Diagnosis not present

## 2021-08-07 DIAGNOSIS — D509 Iron deficiency anemia, unspecified: Secondary | ICD-10-CM | POA: Diagnosis not present

## 2021-08-07 DIAGNOSIS — C3490 Malignant neoplasm of unspecified part of unspecified bronchus or lung: Secondary | ICD-10-CM | POA: Diagnosis not present

## 2021-08-07 DIAGNOSIS — N179 Acute kidney failure, unspecified: Secondary | ICD-10-CM | POA: Diagnosis not present

## 2021-08-07 DIAGNOSIS — D649 Anemia, unspecified: Secondary | ICD-10-CM | POA: Diagnosis not present

## 2021-08-07 DIAGNOSIS — E440 Moderate protein-calorie malnutrition: Secondary | ICD-10-CM | POA: Diagnosis not present

## 2021-08-07 DIAGNOSIS — R7401 Elevation of levels of liver transaminase levels: Secondary | ICD-10-CM | POA: Diagnosis not present

## 2021-08-07 DIAGNOSIS — I5020 Unspecified systolic (congestive) heart failure: Secondary | ICD-10-CM | POA: Diagnosis not present

## 2021-08-07 DIAGNOSIS — R571 Hypovolemic shock: Secondary | ICD-10-CM | POA: Diagnosis not present

## 2021-08-07 DIAGNOSIS — I10 Essential (primary) hypertension: Secondary | ICD-10-CM | POA: Diagnosis not present

## 2021-08-07 DIAGNOSIS — F329 Major depressive disorder, single episode, unspecified: Secondary | ICD-10-CM | POA: Diagnosis not present

## 2021-08-07 DIAGNOSIS — E785 Hyperlipidemia, unspecified: Secondary | ICD-10-CM | POA: Diagnosis not present

## 2021-08-07 LAB — COMPREHENSIVE METABOLIC PANEL
ALT: 376 U/L — ABNORMAL HIGH (ref 0–44)
AST: 261 U/L — ABNORMAL HIGH (ref 15–41)
Albumin: 2.6 g/dL — ABNORMAL LOW (ref 3.5–5.0)
Alkaline Phosphatase: 712 U/L — ABNORMAL HIGH (ref 38–126)
Anion gap: 8 (ref 5–15)
BUN: 23 mg/dL (ref 8–23)
CO2: 23 mmol/L (ref 22–32)
Calcium: 8.3 mg/dL — ABNORMAL LOW (ref 8.9–10.3)
Chloride: 107 mmol/L (ref 98–111)
Creatinine, Ser: 1.05 mg/dL — ABNORMAL HIGH (ref 0.44–1.00)
GFR, Estimated: 60 mL/min — ABNORMAL LOW (ref >60)
Glucose, Bld: 344 mg/dL — ABNORMAL HIGH (ref 70–99)
Potassium: 4.5 mmol/L (ref 3.5–5.1)
Sodium: 138 mmol/L (ref 135–145)
Total Bilirubin: 1.1 mg/dL (ref 0.3–1.2)
Total Protein: 5.1 g/dL — ABNORMAL LOW (ref 6.5–8.1)

## 2021-08-07 LAB — URINE CULTURE: Culture: NO GROWTH

## 2021-08-07 LAB — CULTURE, BLOOD (ROUTINE X 2)
Culture: NO GROWTH
Culture: NO GROWTH

## 2021-08-07 LAB — GLUCOSE, CAPILLARY
Glucose-Capillary: 313 mg/dL — ABNORMAL HIGH (ref 70–99)
Glucose-Capillary: 305 mg/dL — ABNORMAL HIGH (ref 70–99)
Glucose-Capillary: 314 mg/dL — ABNORMAL HIGH (ref 70–99)
Glucose-Capillary: 317 mg/dL — ABNORMAL HIGH (ref 70–99)

## 2021-08-07 MED ORDER — INSULIN GLARGINE-YFGN 100 UNIT/ML ~~LOC~~ SOLN
18.0000 [IU] | Freq: Every day | SUBCUTANEOUS | Status: DC
  Filled 2021-08-07: qty 0.18

## 2021-08-07 MED ORDER — INSULIN ASPART 100 UNIT/ML IJ SOLN
8.0000 [IU] | Freq: Three times a day (TID) | INTRAMUSCULAR | Status: DC
  Administered 2021-08-07: 17:00:00 8 [IU] via SUBCUTANEOUS
  Filled 2021-08-07: qty 1

## 2021-08-07 MED ORDER — METHYLPREDNISOLONE SODIUM SUCC 125 MG IJ SOLR
100.0000 mg | Freq: Every day | INTRAMUSCULAR | Status: DC
  Administered 2021-08-08 – 2021-08-09 (×2): 100 mg via INTRAVENOUS
  Filled 2021-08-07 (×2): qty 2

## 2021-08-07 MED ORDER — INSULIN GLARGINE-YFGN 100 UNIT/ML ~~LOC~~ SOLN
20.0000 [IU] | Freq: Every day | SUBCUTANEOUS | Status: DC
  Administered 2021-08-07 – 2021-08-08 (×2): 20 [IU] via SUBCUTANEOUS
  Filled 2021-08-07 (×3): qty 0.2

## 2021-08-07 MED ORDER — INSULIN ASPART 100 UNIT/ML IJ SOLN
4.0000 [IU] | Freq: Once | INTRAMUSCULAR | Status: AC
  Administered 2021-08-07: 4 [IU] via SUBCUTANEOUS
  Filled 2021-08-07: qty 1

## 2021-08-07 MED ORDER — INSULIN ASPART 100 UNIT/ML IJ SOLN
6.0000 [IU] | Freq: Three times a day (TID) | INTRAMUSCULAR | Status: DC
  Administered 2021-08-07 (×2): 6 [IU] via SUBCUTANEOUS
  Filled 2021-08-07 (×2): qty 1

## 2021-08-07 MED ORDER — SPIRONOLACTONE 25 MG PO TABS
25.0000 mg | ORAL_TABLET | Freq: Every day | ORAL | Status: DC
  Administered 2021-08-07 – 2021-08-09 (×3): 25 mg via ORAL
  Filled 2021-08-07 (×3): qty 1

## 2021-08-07 NOTE — Progress Notes (Signed)
PHARMACIST - PHYSICIAN COMMUNICATION   CONCERNING: Methylprednisolone IV    Current order: Methylprednisolone IV 50 mg BID     DESCRIPTION: Per [facility] Protocol:   IV methylprednisolone will be converted to either a q12h or q24h frequency with the same total daily dose (TDD).  Ordered Dose: 1 to 125 mg TDD; convert to: TDD q24h.  Ordered Dose: 126 to 250 mg TDD; convert to: TDD div q12h.  Ordered Dose: >250 mg TDD; DAW.  Order has been adjusted to: Methylprednisolone IV 100 mg daily   Benita Gutter 08/07/2021 11:43 AM

## 2021-08-07 NOTE — Progress Notes (Signed)
PROGRESS NOTE    [name] [name]  LDJ:[id number] DOB: Dec 19, 1955 DOA: 08/02/2021 PCP: [name], MD    Chief Complaint  Patient presents with   Hypotension    Brief Narrative:  Patient is a pleasant unfortunate 66 year old female history of CAD, CHF, CKD, lung cancer, insulin-dependent type 2 diabetes, hypertension hyperlipidemia history of right BKA, PAD, depression presented from oncologist office due to transaminitis, hypotension, tachycardia, poor oral intake.  Initial EKG concerning for ST elevations in leads III, aVF ED provider spoke with cardiology who felt not a STEMI recommended IV fluids with repeat EKG showing resolution of ST elevation.  Patient noted with lung cancer status postsurgical resection, adjuvant chemotherapy, status post 1 dose immunotherapy.  Concern patient's immunotherapy may have led to severe transaminitis.  Oncology following and recommended IV Solu-Medrol which patient was started on.    Assessment & Plan:   Assessment and Plan: * Transaminitis- (present on admission) - Felt likely secondary to immunotherapy. -Patient noted to have had ultrasound done 07/28/2021 with no biliary obstruction. -Repeat right upper quadrant ultrasound unremarkable.. -Statin on hold.  -LFTs trending down -Oncology consulted recommended Solu-Medrol 1 mg/kg daily which patient was started on and dose adjusted to twice daily per oncology recommendations with goal to be less than 5 times upper limits of normal (AST <200, ALT < 220) and bili < 2, then subsequently transition to prednisone 60 mg on discharge with outpatient follow-up with oncology for further taper.  -Supportive care.    Hypovolemic shock ([place])- (present on admission) - Hypovolemic shock felt likely secondary to volume depletion. -Blood pressure responded with IV fluids.   -No source of infection noted.   -IV fluids saline locked.  AKI (acute kidney injury) ([place])- (present on admission) - Secondary to  prerenal azotemia secondary to volume depletion in the setting of hypovolemic shock. -Aldactone held. -Blood pressure improved with hydration. -Urinalysis with trace leukocytes, nitrite negative, 0-5 WBCs. -Renal function trending down. -Saline lock IV fluids..  Normocytic anemia- (present on admission) - Hemoglobin stable at 8.1 -Follow H&H. -No signs of bleeding -Transfusion threshold hemoglobin < 7.   Stage 3b chronic kidney disease ([place])- (present on admission) - Creatinine elevated from baseline. -Renal function improved with hydration.   -IV fluids saline locked.  -See AKI.  Moderate protein-calorie malnutrition ([place])- (present on admission) - Secondary to non-small cell lung cancer. -Nutritional supplementation.  Non-small cell lung cancer ([place])- (present on admission) - Being followed by oncology.   -Outpatient follow-up with oncology -  Primary hypertension- (present on admission) - Patient noted to be in hypovolemic shock on admission which responded to IV fluids. -BP improved. -Continue Isordil and Toprol-XL.   -Saline lock IV fluids.    Hyperlipidemia- (present on admission) - Continue to hold statin secondary to transaminitis  Depression- (present on admission) - Continue Cymbalta.  Type II diabetes mellitus ([facility]) - Hemoglobin A1c 7.8 (01/25/2021) -Repeat hemoglobin A1c 6.1 (08/04/2021). -CBG 60 on 08/05/2021.  -CBG currently at 313 this morning.  -Elevated CBG likely secondary to steroids.  -Increase Semglee back to 20 units daily.  -Increase NovoLog meal coverage to 8 units 3 times daily with meals.   -SSI.   -Outpatient follow-up.   Tobacco use disorder- (present on admission) - Tobacco cessation.  Heart failure with reduced ejection fraction ([place])- (present on admission) - Stable. -Continue Isordil, Toprol-XL -Resume Aldactone.          DVT prophylaxis: Heparin Code Status: Full Family Communication: Updated patient, no family at  bedside.  Disposition:   Status is: Inpatient Remains inpatient appropriate because: Severity of illness           Consultants:  Oncology: Dr.Yu  Procedures:  Right upper quadrant ultrasound 08/03/2021 Chest x-ray 08/02/2021 Lower extremity Dopplers 08/02/2021    Antimicrobials:  None   Subjective: -Multiple BM overnight per patient.  No chest pain.  No shortness of breath.  No abdominal pain.  Overall feeling much better than she did on admission.  Objective: Vitals:   08/06/21 1603 08/06/21 2011 08/07/21 0418 08/07/21 0727  BP: (!) 175/86 (!) 143/86 (!) 159/84 (!) 145/86  Pulse: 69 74 76 79  Resp: 19 16 16 16   Temp: (!) 97.5 F (36.4 C) 98 F (36.7 C) 98.4 F (36.9 C) 98.5 F (36.9 C)  TempSrc: Oral Oral Oral Oral  SpO2: 100% 100% 100% 99%  Weight:      Height:        Intake/Output Summary (Last 24 hours) at 08/07/2021 1522 Last data filed at 08/07/2021 1423 Gross per 24 hour  Intake 240 ml  Output --  Net 240 ml   Filed Weights   08/02/21 1228  Weight: 51.7 kg    Examination:  General exam: NAD. Respiratory system: Clear to auscultation bilaterally.  No wheezes, no crackles, no rhonchi.  Normal respiratory effort.   Cardiovascular system: RRR no murmurs rubs or gallops.  No JVD.  No lower extremity edema.  Gastrointestinal system: Abdomen is soft, nontender, nondistended, positive bowel sounds.  No rebound.  No guarding.   Central nervous system: Alert and oriented.  Moving extremities spontaneously.  No focal neurological deficits.   Extremities: Symmetric 5 x 5 power.  Status post right BKA. Skin: No rashes, lesions or ulcers Psychiatry: Judgement and insight appear normal. Mood & affect appropriate.     Data Reviewed:   CBC: Recent Labs  Lab 08/02/21 1108 08/03/21 0430 08/04/21 0356 08/05/21 0413 08/06/21 0645  WBC 4.7 4.2 5.1 4.2 4.2  NEUTROABS 3.1  --  4.4  --   --   HGB 9.6* 7.9* 8.6* 8.3* 8.1*  HCT 29.4* 24.2* 25.9* 25.4* 24.8*   MCV 95.5 94.9 94.5 95.1 96.9  PLT 285 256 285 267 657    Basic Metabolic Panel: Recent Labs  Lab 08/03/21 0430 08/04/21 0356 08/05/21 0413 08/06/21 0645 08/07/21 0600  NA 138 140 140 138 138  K 4.1 4.0 4.2 4.4 4.5  CL 112* 114* 114* 109 107  CO2 20* 20* 23 23 23   GLUCOSE 117* 199* 181* 206* 344*  BUN 40* 29* 19 19 23   CREATININE 2.10* 1.46* 1.13* 1.12* 1.05*  CALCIUM 7.8* 8.1* 8.0* 8.3* 8.3*  MG  --  2.2 2.2 2.3  --     GFR: Estimated Creatinine Clearance: 44.8 mL/min (A) (by C-G formula based on SCr of 1.05 mg/dL (H)).  Liver Function Tests: Recent Labs  Lab 08/03/21 0430 08/04/21 0356 08/05/21 0413 08/06/21 0645 08/07/21 0600  AST 930* 679* 431* 326* 261*  ALT 620* 559* 451* 423* 376*  ALKPHOS 895* 902* 842* 818* 712*  BILITOT 2.5* 2.4* 1.5* 1.4* 1.1  PROT 5.4* 5.9* 5.4* 5.7* 5.1*  ALBUMIN 2.6* 2.6* 2.5* 2.6* 2.6*    CBG: Recent Labs  Lab 08/06/21 1140 08/06/21 1604 08/06/21 2045 08/07/21 0729 08/07/21 1208  GLUCAP 252* 223* 237* 313* 305*     Recent Results (from the past 240 hour(s))  Resp Panel by RT-PCR (Flu A&B, Covid) Nasopharyngeal Swab     Status: None  Collection Time: 08/02/21  1:41 PM   Specimen: Nasopharyngeal Swab; Nasopharyngeal(NP) swabs in vial transport medium  Result Value Ref Range Status   SARS Coronavirus 2 by RT PCR NEGATIVE NEGATIVE Final    Comment: (NOTE) SARS-CoV-2 target nucleic acids are NOT DETECTED.  The SARS-CoV-2 RNA is generally detectable in upper respiratory specimens during the acute phase of infection. The lowest concentration of SARS-CoV-2 viral copies this assay can detect is 138 copies/mL. A negative result does not preclude SARS-Cov-2 infection and should not be used as the sole basis for treatment or other patient management decisions. A negative result may occur with  improper specimen collection/handling, submission of specimen other than nasopharyngeal swab, presence of viral mutation(s) within  the areas targeted by this assay, and inadequate number of viral copies(<138 copies/mL). A negative result must be combined with clinical observations, patient history, and epidemiological information. The expected result is Negative.  Fact Sheet for Patients:  EntrepreneurPulse.com.au  Fact Sheet for Healthcare Providers:  IncredibleEmployment.be  This test is no t yet approved or cleared by the [place] FDA and  has been authorized for detection and/or diagnosis of SARS-CoV-2 by FDA under an Emergency Use Authorization (EUA). This EUA will remain  in effect (meaning this test can be used) for the duration of the COVID-19 declaration under Section 564(b)(1) of the Act, 21 U.S.C.section 360bbb-3(b)(1), unless the authorization is terminated  or revoked sooner.       Influenza A by PCR NEGATIVE NEGATIVE Final   Influenza B by PCR NEGATIVE NEGATIVE Final    Comment: (NOTE) The Xpert Xpress SARS-CoV-2/FLU/RSV plus assay is intended as an aid in the diagnosis of influenza from Nasopharyngeal swab specimens and should not be used as a sole basis for treatment. Nasal washings and aspirates are unacceptable for Xpert Xpress SARS-CoV-2/FLU/RSV testing.  Fact Sheet for Patients: EntrepreneurPulse.com.au  Fact Sheet for Healthcare Providers: IncredibleEmployment.be  This test is not yet approved or cleared by the [place] FDA and has been authorized for detection and/or diagnosis of SARS-CoV-2 by FDA under an Emergency Use Authorization (EUA). This EUA will remain in effect (meaning this test can be used) for the duration of the COVID-19 declaration under Section 564(b)(1) of the Act, 21 U.S.C. section 360bbb-3(b)(1), unless the authorization is terminated or revoked.  Performed at [facility], [address] [place], [place] [address]   Blood culture (routine x 2)     Status: None    Collection Time: 08/02/21  1:49 PM   Specimen: BLOOD  Result Value Ref Range Status   Specimen Description BLOOD LW  Final   Special Requests BOTTLES DRAWN AEROBIC AND ANAEROBIC BCAV  Final   Culture   Final    NO GROWTH 5 DAYS Performed at [facility], [address], [place], [place] [address]    Report Status 08/07/2021 FINAL  Final  Blood culture (routine x 2)     Status: None   Collection Time: 08/02/21  2:07 PM   Specimen: BLOOD  Result Value Ref Range Status   Specimen Description BLOOD BRH  Final   Special Requests BOTTLES DRAWN AEROBIC AND ANAEROBIC BCAV  Final   Culture   Final    NO GROWTH 5 DAYS Performed at [facility], [address] [place], [place] [address]    Report Status 08/07/2021 FINAL  Final  Urine Culture     Status: None   Collection Time: 08/06/21 12:26 PM   Specimen: Urine, Clean Catch  Result  Value Ref Range Status   Specimen Description   Final    URINE, CLEAN CATCH Performed at [facility], [address] [place], [place] [address]    Special Requests   Final    NONE Performed at [facility], [address], [place], [place] [address]    Culture   Final    NO GROWTH Performed at [facility], [place] [address] [place], [place] [address]    Report Status 08/07/2021 FINAL  Final         Radiology Studies: No results found.      Scheduled Meds:  aspirin EC  81 mg Oral q AM   diphenhydrAMINE  25 mg Oral QHS   dronabinol  2.5 mg Oral BID AC   DULoxetine  40 mg Oral BID   gabapentin  100 mg Oral BID   heparin  5,000 Units Subcutaneous Q8H   insulin aspart  0-9 Units Subcutaneous TID WC   insulin aspart  6 Units Subcutaneous TID PC   insulin glargine-yfgn  18 Units Subcutaneous Q2200   isosorbide dinitrate  30 mg Oral TID   [START ON 08/08/2021] methylPREDNISolone (SOLU-MEDROL) injection  100 mg Intravenous Daily   methylPREDNISolone (SOLU-MEDROL) injection  50 mg Intravenous Q12H    metoprolol succinate  50 mg Oral Daily   multivitamin with minerals  1 tablet Oral q AM   pantoprazole  40 mg Oral Daily   Continuous Infusions:     LOS: 5 days    Time spent: 40 minutes    Irine Seal, MD [facility]   To contact the attending provider between 7A-7P or the covering provider during after hours 7P-7A, please log into the web site [email or web address] and access using universal [facility] password for that web site. If you do not have the password, please call the hospital operator.  08/07/2021, 3:22 PM

## 2021-08-08 DIAGNOSIS — C3490 Malignant neoplasm of unspecified part of unspecified bronchus or lung: Secondary | ICD-10-CM | POA: Diagnosis not present

## 2021-08-08 DIAGNOSIS — E785 Hyperlipidemia, unspecified: Secondary | ICD-10-CM | POA: Diagnosis not present

## 2021-08-08 DIAGNOSIS — I5020 Unspecified systolic (congestive) heart failure: Secondary | ICD-10-CM | POA: Diagnosis not present

## 2021-08-08 DIAGNOSIS — E440 Moderate protein-calorie malnutrition: Secondary | ICD-10-CM | POA: Diagnosis not present

## 2021-08-08 DIAGNOSIS — N1832 Chronic kidney disease, stage 3b: Secondary | ICD-10-CM | POA: Diagnosis not present

## 2021-08-08 DIAGNOSIS — F329 Major depressive disorder, single episode, unspecified: Secondary | ICD-10-CM | POA: Diagnosis not present

## 2021-08-08 DIAGNOSIS — R571 Hypovolemic shock: Secondary | ICD-10-CM | POA: Diagnosis not present

## 2021-08-08 DIAGNOSIS — I10 Essential (primary) hypertension: Secondary | ICD-10-CM | POA: Diagnosis not present

## 2021-08-08 DIAGNOSIS — D649 Anemia, unspecified: Secondary | ICD-10-CM | POA: Diagnosis not present

## 2021-08-08 DIAGNOSIS — R7401 Elevation of levels of liver transaminase levels: Secondary | ICD-10-CM | POA: Diagnosis not present

## 2021-08-08 DIAGNOSIS — N179 Acute kidney failure, unspecified: Secondary | ICD-10-CM | POA: Diagnosis not present

## 2021-08-08 DIAGNOSIS — D509 Iron deficiency anemia, unspecified: Secondary | ICD-10-CM | POA: Diagnosis not present

## 2021-08-08 LAB — COMPREHENSIVE METABOLIC PANEL
ALT: 372 U/L — ABNORMAL HIGH (ref 0–44)
AST: 256 U/L — ABNORMAL HIGH (ref 15–41)
Albumin: 2.5 g/dL — ABNORMAL LOW (ref 3.5–5.0)
Alkaline Phosphatase: 609 U/L — ABNORMAL HIGH (ref 38–126)
Anion gap: 6 (ref 5–15)
BUN: 25 mg/dL — ABNORMAL HIGH (ref 8–23)
CO2: 24 mmol/L (ref 22–32)
Calcium: 8.4 mg/dL — ABNORMAL LOW (ref 8.9–10.3)
Chloride: 107 mmol/L (ref 98–111)
Creatinine, Ser: 1.08 mg/dL — ABNORMAL HIGH (ref 0.44–1.00)
GFR, Estimated: 58 mL/min — ABNORMAL LOW (ref >60)
Glucose, Bld: 269 mg/dL — ABNORMAL HIGH (ref 70–99)
Potassium: 4.7 mmol/L (ref 3.5–5.1)
Sodium: 137 mmol/L (ref 135–145)
Total Bilirubin: 0.9 mg/dL (ref 0.3–1.2)
Total Protein: 5.7 g/dL — ABNORMAL LOW (ref 6.5–8.1)

## 2021-08-08 LAB — GLUCOSE, CAPILLARY
Glucose-Capillary: 199 mg/dL — ABNORMAL HIGH (ref 70–99)
Glucose-Capillary: 188 mg/dL — ABNORMAL HIGH (ref 70–99)
Glucose-Capillary: 316 mg/dL — ABNORMAL HIGH (ref 70–99)
Glucose-Capillary: 214 mg/dL — ABNORMAL HIGH (ref 70–99)

## 2021-08-08 LAB — CBC
HCT: 25.1 % — ABNORMAL LOW (ref 36.0–46.0)
Hemoglobin: 7.8 g/dL — ABNORMAL LOW (ref 12.0–15.0)
MCH: 30.6 pg (ref 26.0–34.0)
MCHC: 31.1 g/dL (ref 30.0–36.0)
MCV: 98.4 fL (ref 80.0–100.0)
Platelets: 245 10*3/uL (ref 150–400)
RBC: 2.55 MIL/uL — ABNORMAL LOW (ref 3.87–5.11)
RDW: 15.6 % — ABNORMAL HIGH (ref 11.5–15.5)
WBC: 5.1 10*3/uL (ref 4.0–10.5)
nRBC: 0 % (ref 0.0–0.2)

## 2021-08-08 LAB — MAGNESIUM: Magnesium: 2.5 mg/dL — ABNORMAL HIGH (ref 1.7–2.4)

## 2021-08-08 MED ORDER — INSULIN ASPART 100 UNIT/ML IJ SOLN
10.0000 [IU] | Freq: Three times a day (TID) | INTRAMUSCULAR | Status: DC
  Administered 2021-08-08 – 2021-08-09 (×5): 10 [IU] via SUBCUTANEOUS
  Filled 2021-08-08 (×5): qty 1

## 2021-08-08 NOTE — Progress Notes (Addendum)
Physical Therapy Treatment Patient Details Name: [name] [name] MRN: [id number] DOB: [birth year] Today's Date: 08/08/2021   History of Present Illness 66 year old female with PMHx of CAD, CHF, CKD, lung cancer, insulin-dependent type 2 diabetes, HTN, HLD, hx of right BKA, PAD, hx DVT and PE (01/2020), NSTEMI (2012), and depression presented from oncologist office due to transaminitis, hypotension, tachycardia, poor oral intake.    PT Comments    Pt awake and alert resting in recliner upon PT entrance into room today. She denies any c/o pain at rest and is very willing to work w/ PT today. Pt is able to perform sit to stand w/ modI and is able to ambulate ~834ft in the hall w/ CGA. DGI was spread out and performed throughout the course of ambulation, and she tolerated it well with slight instances of LOB that she was able to independently recover from. Pt will benefit from continued skilled PT in order to improve LE strength, mobility, gait, and restore PLOF. Current discharge recommendation remains appropriate due to the level of assistance required by the patient to ensure safety and improve overall function.    Recommendations for follow up therapy are one component of a multi-disciplinary discharge planning process, led by the attending physician.  Recommendations may be updated based on patient status, additional functional criteria and insurance authorization.  Follow Up Recommendations  Outpatient PT     Assistance Recommended at Discharge Intermittent Supervision/Assistance  Patient can return home with the following A little help with walking and/or transfers;A little help with bathing/dressing/bathroom;Assistance with cooking/housework;Assist for transportation;Help with stairs or ramp for entrance   Equipment Recommendations  None recommended by PT    Recommendations for Other Services       Precautions / Restrictions Precautions Precautions: Fall Precaution Comments: hx R BKA,  has prosthesis here Restrictions Weight Bearing Restrictions: No     Mobility  Bed Mobility                    Transfers     Transfers: Sit to/from Stand Sit to Stand: Modified independent (Device/Increase time)           General transfer comment: Pt was sitting in recliner upon PT entrance into room    Ambulation/Gait Ambulation/Gait assistance: Min guard Gait Distance (Feet): 820 Feet Assistive device: None Gait Pattern/deviations: Drifts right/left Gait velocity: decreased     General Gait Details: Pt was able to complete DGI over course of 832ft   Stairs             Wheelchair Mobility    Modified Rankin (Stroke Patients Only)       Balance Overall balance assessment: History of Falls, Needs assistance Sitting-balance support: No upper extremity supported, Feet supported Sitting balance-Leahy Scale: Normal     Standing balance support: No upper extremity supported Standing balance-Leahy Scale: Fair Standing balance comment: mildly unsteady, able to self correct slight LOBs                 Standardized Balance Assessment Standardized Balance Assessment : Dynamic Gait Index   Dynamic Gait Index Level Surface: Normal Change in Gait Speed: Normal Gait with Horizontal Head Turns: Mild Impairment Gait with Vertical Head Turns: Mild Impairment Gait and Pivot Turn: Mild Impairment Step Over Obstacle: Mild Impairment Step Around Obstacles: Normal Steps: Moderate Impairment Total Score: 18      Cognition Arousal/Alertness: Awake/alert Behavior During Therapy: WFL for tasks assessed/performed Overall Cognitive Status: Within Functional Limits for tasks assessed  Exercises      General Comments        Pertinent Vitals/Pain Pain Assessment Pain Assessment: No/denies pain    Home Living                          Prior Function            PT Goals  (current goals can now be found in the care plan section) Acute Rehab PT Goals Patient Stated Goal: improve symptoms to return home    Frequency    Min 2X/week      PT Plan Current plan remains appropriate    Co-evaluation              AM-PAC PT "6 Clicks" Mobility   Outcome Measure  Help needed turning from your back to your side while in a flat bed without using bedrails?: None Help needed moving from lying on your back to sitting on the side of a flat bed without using bedrails?: None Help needed moving to and from a bed to a chair (including a wheelchair)?: None Help needed standing up from a chair using your arms (e.g., wheelchair or bedside chair)?: None Help needed to walk in hospital room?: A Little Help needed climbing 3-5 steps with a railing? : A Little 6 Click Score: 22    End of Session Equipment Utilized During Treatment: Gait belt Activity Tolerance: Patient tolerated treatment well;No increased pain Patient left: in chair;with call bell/phone within reach Nurse Communication: Mobility status PT Visit Diagnosis: Unsteadiness on feet (R26.81);Other abnormalities of gait and mobility (R26.89);History of falling (Z91.81);Muscle weakness (generalized) (M62.81)     Time: 1043-1100 PT Time Calculation (min) (ACUTE ONLY): 17 min  Charges:                         Jonnie Kind, SPT 08/08/2021, 11:27 AM

## 2021-08-08 NOTE — Progress Notes (Signed)
PROGRESS NOTE    [name] [name]  YKD:[id number] DOB: Apr 07, 1956 DOA: 08/02/2021 PCP: [name], MD    Chief Complaint  Patient presents with   Hypotension    Brief Narrative:  Patient is a pleasant unfortunate 66 year old female history of CAD, CHF, CKD, lung cancer, insulin-dependent type 2 diabetes, hypertension hyperlipidemia history of right BKA, PAD, depression presented from oncologist office due to transaminitis, hypotension, tachycardia, poor oral intake.  Initial EKG concerning for ST elevations in leads III, aVF ED provider spoke with cardiology who felt not a STEMI recommended IV fluids with repeat EKG showing resolution of ST elevation.  Patient noted with lung cancer status postsurgical resection, adjuvant chemotherapy, status post 1 dose immunotherapy.  Concern patient's immunotherapy may have led to severe transaminitis.  Oncology following and recommended IV Solu-Medrol which patient was started on.    Assessment & Plan:   Assessment and Plan: * Transaminitis- (present on admission) - Felt likely secondary to immunotherapy. -Patient noted to have had ultrasound done 07/28/2021 with no biliary obstruction. -Repeat right upper quadrant ultrasound unremarkable.. -Statin on hold.  -LFTs trending down -Oncology consulted recommended Solu-Medrol 1 mg/kg daily which patient was started on and dose adjusted to twice daily per oncology recommendations with goal to be less than 5 times upper limits of normal (AST <200, ALT < 220) and bili < 2, then subsequently transition to prednisone 60 mg on discharge with outpatient follow-up with oncology for further taper.  -Supportive care.    Hypovolemic shock ([place])- (present on admission) - Hypovolemic shock felt likely secondary to volume depletion. -Blood pressure responded with IV fluids.   -No source of infection noted.   -IV fluids saline locked.  AKI (acute kidney injury) ([place])- (present on admission) - Secondary to  prerenal azotemia secondary to volume depletion in the setting of hypovolemic shock. -Aldactone held initially on presentation but has subsequently been resumed. -Blood pressure improved with hydration. -Urinalysis with trace leukocytes, nitrite negative, 0-5 WBCs. -Renal function stabilizing with creatinine currently at 1.08. -Saline lock IV fluids..  Normocytic anemia- (present on admission) - Hemoglobin stable at 7.8. -Follow H&H. -No signs of bleeding -Transfusion threshold hemoglobin < 7.   Stage 3b chronic kidney disease ([place])- (present on admission) - Creatinine elevated from baseline. -Renal function improved with hydration.   -IV fluids saline locked.  -See AKI.  Moderate protein-calorie malnutrition ([place])- (present on admission) - Secondary to non-small cell lung cancer. -Nutritional supplementation.  Non-small cell lung cancer ([place])- (present on admission) - Being followed by oncology.   -Outpatient follow-up with oncology -  Primary hypertension- (present on admission) - Patient noted to be in hypovolemic shock on admission which responded to IV fluids. -BP improved. -Continue Isordil and Toprol-XL and Aldactone.   -Saline lock IV fluids.    Hyperlipidemia- (present on admission) - Continue to hold statin secondary to transaminitis  Depression- (present on admission) - Continue Cymbalta.  Type II diabetes mellitus ([facility]) - Hemoglobin A1c 7.8 (01/25/2021) -Repeat hemoglobin A1c 6.1 (08/04/2021). -CBG 60 on 08/05/2021.  -CBG currently at 199 this morning.  -Elevated CBG likely secondary to steroids.  -Continue Semglee back to 20 units daily.  -Increase NovoLog meal coverage to 10 units 3 times daily with meals.   -SSI.   -Outpatient follow-up.   Tobacco use disorder- (present on admission) - Tobacco cessation.  Heart failure with reduced ejection fraction ([place])- (present on admission) - Stable. -Continue Isordil, Toprol-XL -Resume Aldactone.  DVT prophylaxis: Heparin Code Status: Full Family Communication: Updated patient, no family at bedside. Disposition:   Status is: Inpatient Remains inpatient appropriate because: Severity of illness           Consultants:  Oncology: Dr.Yu  Procedures:  Right upper quadrant ultrasound 08/03/2021 Chest x-ray 08/02/2021 Lower extremity Dopplers 08/02/2021    Antimicrobials:  None   Subjective: -States multiple bowel movements slowed down after  stool softener discontinued.  Sitting up in chair.  No chest pain.  No shortness of breath.  No abdominal pain.  Tolerating current diet.    Objective: Vitals:   08/07/21 1606 08/07/21 2115 08/08/21 0452 08/08/21 0800  BP: (!) 145/89 (!) 143/92 (!) 167/88 (!) 180/86  Pulse: 77 73 75 72  Resp: 18 18 18 16   Temp: 98.3 F (36.8 C)  98 F (36.7 C) 97.9 F (36.6 C)  TempSrc: Oral  Oral Oral  SpO2: 100% 100% 100% 98%  Weight:      Height:        Intake/Output Summary (Last 24 hours) at 08/08/2021 1435 Last data filed at 08/08/2021 1427 Gross per 24 hour  Intake 720 ml  Output --  Net 720 ml   Filed Weights   08/02/21 1228  Weight: 51.7 kg    Examination:  General exam: NAD. Respiratory system: CTA B.  No wheezes, no crackles, no rhonchi.  Normal respiratory effort. Cardiovascular system: Regular rate and rhythm no murmurs rubs or gallops.  No JVD.  No lower extremity edema. Gastrointestinal system: Abdomen is soft, nontender, nondistended, positive bowel sounds.  No rebound.  No guarding.  Central nervous system: Alert and oriented.  Moving extremities spontaneously.  No focal neurological deficits.   Extremities: Symmetric 5 x 5 power.  Status post right BKA. Skin: No rashes, lesions or ulcers Psychiatry: Judgement and insight appear normal. Mood & affect appropriate.     Data Reviewed:   CBC: Recent Labs  Lab 08/02/21 1108 08/03/21 0430 08/04/21 0356 08/05/21 0413 08/06/21 0645 08/08/21 0442  WBC  4.7 4.2 5.1 4.2 4.2 5.1  NEUTROABS 3.1  --  4.4  --   --   --   HGB 9.6* 7.9* 8.6* 8.3* 8.1* 7.8*  HCT 29.4* 24.2* 25.9* 25.4* 24.8* 25.1*  MCV 95.5 94.9 94.5 95.1 96.9 98.4  PLT 285 256 285 267 241 831    Basic Metabolic Panel: Recent Labs  Lab 08/04/21 0356 08/05/21 0413 08/06/21 0645 08/07/21 0600 08/08/21 0442  NA 140 140 138 138 137  K 4.0 4.2 4.4 4.5 4.7  CL 114* 114* 109 107 107  CO2 20* 23 23 23 24   GLUCOSE 199* 181* 206* 344* 269*  BUN 29* 19 19 23  25*  CREATININE 1.46* 1.13* 1.12* 1.05* 1.08*  CALCIUM 8.1* 8.0* 8.3* 8.3* 8.4*  MG 2.2 2.2 2.3  --  2.5*    GFR: Estimated Creatinine Clearance: 43.5 mL/min (A) (by C-G formula based on SCr of 1.08 mg/dL (H)).  Liver Function Tests: Recent Labs  Lab 08/04/21 0356 08/05/21 0413 08/06/21 0645 08/07/21 0600 08/08/21 0442  AST 679* 431* 326* 261* 256*  ALT 559* 451* 423* 376* 372*  ALKPHOS 902* 842* 818* 712* 609*  BILITOT 2.4* 1.5* 1.4* 1.1 0.9  PROT 5.9* 5.4* 5.7* 5.1* 5.7*  ALBUMIN 2.6* 2.5* 2.6* 2.6* 2.5*    CBG: Recent Labs  Lab 08/07/21 1208 08/07/21 1608 08/07/21 2113 08/08/21 0801 08/08/21 1156  GLUCAP 305* 314* 317* 199* 188*     Recent Results (from  the past 240 hour(s))  Resp Panel by RT-PCR (Flu A&B, Covid) Nasopharyngeal Swab     Status: None   Collection Time: 08/02/21  1:41 PM   Specimen: Nasopharyngeal Swab; Nasopharyngeal(NP) swabs in vial transport medium  Result Value Ref Range Status   SARS Coronavirus 2 by RT PCR NEGATIVE NEGATIVE Final    Comment: (NOTE) SARS-CoV-2 target nucleic acids are NOT DETECTED.  The SARS-CoV-2 RNA is generally detectable in upper respiratory specimens during the acute phase of infection. The lowest concentration of SARS-CoV-2 viral copies this assay can detect is 138 copies/mL. A negative result does not preclude SARS-Cov-2 infection and should not be used as the sole basis for treatment or other patient management decisions. A negative result may  occur with  improper specimen collection/handling, submission of specimen other than nasopharyngeal swab, presence of viral mutation(s) within the areas targeted by this assay, and inadequate number of viral copies(<138 copies/mL). A negative result must be combined with clinical observations, patient history, and epidemiological information. The expected result is Negative.  Fact Sheet for Patients:  EntrepreneurPulse.com.au  Fact Sheet for Healthcare Providers:  IncredibleEmployment.be  This test is no t yet approved or cleared by the [place] FDA and  has been authorized for detection and/or diagnosis of SARS-CoV-2 by FDA under an Emergency Use Authorization (EUA). This EUA will remain  in effect (meaning this test can be used) for the duration of the COVID-19 declaration under Section 564(b)(1) of the Act, 21 U.S.C.section 360bbb-3(b)(1), unless the authorization is terminated  or revoked sooner.       Influenza A by PCR NEGATIVE NEGATIVE Final   Influenza B by PCR NEGATIVE NEGATIVE Final    Comment: (NOTE) The Xpert Xpress SARS-CoV-2/FLU/RSV plus assay is intended as an aid in the diagnosis of influenza from Nasopharyngeal swab specimens and should not be used as a sole basis for treatment. Nasal washings and aspirates are unacceptable for Xpert Xpress SARS-CoV-2/FLU/RSV testing.  Fact Sheet for Patients: EntrepreneurPulse.com.au  Fact Sheet for Healthcare Providers: IncredibleEmployment.be  This test is not yet approved or cleared by the [place] FDA and has been authorized for detection and/or diagnosis of SARS-CoV-2 by FDA under an Emergency Use Authorization (EUA). This EUA will remain in effect (meaning this test can be used) for the duration of the COVID-19 declaration under Section 564(b)(1) of the Act, 21 U.S.C. section 360bbb-3(b)(1), unless the authorization is terminated  or revoked.  Performed at [facility], [address] [place], [place] [address]   Blood culture (routine x 2)     Status: None   Collection Time: 08/02/21  1:49 PM   Specimen: BLOOD  Result Value Ref Range Status   Specimen Description BLOOD LW  Final   Special Requests BOTTLES DRAWN AEROBIC AND ANAEROBIC BCAV  Final   Culture   Final    NO GROWTH 5 DAYS Performed at [facility], [address] [place], [place] [address]    Report Status 08/07/2021 FINAL  Final  Blood culture (routine x 2)     Status: None   Collection Time: 08/02/21  2:07 PM   Specimen: BLOOD  Result Value Ref Range Status   Specimen Description BLOOD [facility]  Final   Special Requests BOTTLES DRAWN AEROBIC AND ANAEROBIC BCAV  Final   Culture   Final    NO GROWTH 5 DAYS Performed at [facility], [address], [place],  [address]    Report Status 08/07/2021 FINAL  Final  Urine  Culture     Status: None   Collection Time: 08/06/21 12:26 PM   Specimen: Urine, Clean Catch  Result Value Ref Range Status   Specimen Description   Final    URINE, CLEAN CATCH Performed at [facility], [address], [place], [place] [address]    Special Requests   Final    NONE Performed at [facility], [address], [place], [place] [address]    Culture   Final    NO GROWTH Performed at [facility], [place] [address] [place], [place] [address]    Report Status 08/07/2021 FINAL  Final         Radiology Studies: No results found.      Scheduled Meds:  aspirin EC  81 mg Oral q AM   diphenhydrAMINE  25 mg Oral QHS   dronabinol  2.5 mg Oral BID AC   DULoxetine  40 mg Oral BID   gabapentin  100 mg Oral BID   heparin  5,000 Units Subcutaneous Q8H   insulin aspart  0-9 Units Subcutaneous TID WC   insulin aspart  10 Units Subcutaneous TID PC   insulin glargine-yfgn  20 Units Subcutaneous Q2200   isosorbide dinitrate  30 mg Oral TID    methylPREDNISolone (SOLU-MEDROL) injection  100 mg Intravenous Daily   metoprolol succinate  50 mg Oral Daily   multivitamin with minerals  1 tablet Oral q AM   pantoprazole  40 mg Oral Daily   spironolactone  25 mg Oral Daily   Continuous Infusions:     LOS: 6 days    Time spent: 40 minutes    Irine Seal, MD [facility]   To contact the attending provider between 7A-7P or the covering provider during after hours 7P-7A, please log into the web site [email or web address] and access using universal [place] password for that web site. If you do not have the password, please call the hospital operator.  08/08/2021, 2:35 PM

## 2021-08-09 DIAGNOSIS — I5020 Unspecified systolic (congestive) heart failure: Secondary | ICD-10-CM | POA: Diagnosis not present

## 2021-08-09 DIAGNOSIS — D509 Iron deficiency anemia, unspecified: Secondary | ICD-10-CM | POA: Diagnosis not present

## 2021-08-09 DIAGNOSIS — R5381 Other malaise: Secondary | ICD-10-CM

## 2021-08-09 DIAGNOSIS — N179 Acute kidney failure, unspecified: Secondary | ICD-10-CM | POA: Diagnosis not present

## 2021-08-09 DIAGNOSIS — E440 Moderate protein-calorie malnutrition: Secondary | ICD-10-CM | POA: Diagnosis not present

## 2021-08-09 DIAGNOSIS — C3490 Malignant neoplasm of unspecified part of unspecified bronchus or lung: Secondary | ICD-10-CM | POA: Diagnosis not present

## 2021-08-09 DIAGNOSIS — I10 Essential (primary) hypertension: Secondary | ICD-10-CM | POA: Diagnosis not present

## 2021-08-09 DIAGNOSIS — E785 Hyperlipidemia, unspecified: Secondary | ICD-10-CM | POA: Diagnosis not present

## 2021-08-09 DIAGNOSIS — R7401 Elevation of levels of liver transaminase levels: Secondary | ICD-10-CM | POA: Diagnosis not present

## 2021-08-09 DIAGNOSIS — N1832 Chronic kidney disease, stage 3b: Secondary | ICD-10-CM | POA: Diagnosis not present

## 2021-08-09 DIAGNOSIS — R571 Hypovolemic shock: Secondary | ICD-10-CM | POA: Diagnosis not present

## 2021-08-09 DIAGNOSIS — F329 Major depressive disorder, single episode, unspecified: Secondary | ICD-10-CM | POA: Diagnosis not present

## 2021-08-09 LAB — COMPREHENSIVE METABOLIC PANEL
ALT: 368 U/L — ABNORMAL HIGH (ref 0–44)
AST: 278 U/L — ABNORMAL HIGH (ref 15–41)
Albumin: 2.7 g/dL — ABNORMAL LOW (ref 3.5–5.0)
Alkaline Phosphatase: 553 U/L — ABNORMAL HIGH (ref 38–126)
Anion gap: 7 (ref 5–15)
BUN: 24 mg/dL — ABNORMAL HIGH (ref 8–23)
CO2: 26 mmol/L (ref 22–32)
Calcium: 8.8 mg/dL — ABNORMAL LOW (ref 8.9–10.3)
Chloride: 106 mmol/L (ref 98–111)
Creatinine, Ser: 0.9 mg/dL (ref 0.44–1.00)
GFR, Estimated: >60 mL/min (ref >60)
Glucose, Bld: 205 mg/dL — ABNORMAL HIGH (ref 70–99)
Potassium: 4.8 mmol/L (ref 3.5–5.1)
Sodium: 139 mmol/L (ref 135–145)
Total Bilirubin: 1 mg/dL (ref 0.3–1.2)
Total Protein: 5.6 g/dL — ABNORMAL LOW (ref 6.5–8.1)

## 2021-08-09 LAB — CBC
HCT: 26.5 % — ABNORMAL LOW (ref 36.0–46.0)
Hemoglobin: 8.3 g/dL — ABNORMAL LOW (ref 12.0–15.0)
MCH: 30.6 pg (ref 26.0–34.0)
MCHC: 31.3 g/dL (ref 30.0–36.0)
MCV: 97.8 fL (ref 80.0–100.0)
Platelets: 253 10*3/uL (ref 150–400)
RBC: 2.71 MIL/uL — ABNORMAL LOW (ref 3.87–5.11)
RDW: 15.8 % — ABNORMAL HIGH (ref 11.5–15.5)
WBC: 6.2 10*3/uL (ref 4.0–10.5)
nRBC: 0 % (ref 0.0–0.2)

## 2021-08-09 LAB — GLUCOSE, CAPILLARY
Glucose-Capillary: 124 mg/dL — ABNORMAL HIGH (ref 70–99)
Glucose-Capillary: 140 mg/dL — ABNORMAL HIGH (ref 70–99)
Glucose-Capillary: 189 mg/dL — ABNORMAL HIGH (ref 70–99)
Glucose-Capillary: 240 mg/dL — ABNORMAL HIGH (ref 70–99)

## 2021-08-09 MED ORDER — LANTUS SOLOSTAR 100 UNIT/ML ~~LOC~~ SOPN: 20.0000 [IU] | PEN_INJECTOR | Freq: Every day | SUBCUTANEOUS | 0 refills | Status: DC

## 2021-08-09 MED ORDER — PREDNISONE 20 MG PO TABS: 100.0000 mg | ORAL_TABLET | Freq: Every day | ORAL | 0 refills | Status: DC

## 2021-08-09 MED ORDER — PANTOPRAZOLE SODIUM 40 MG PO TBEC: 40.0000 mg | DELAYED_RELEASE_TABLET | Freq: Every day | ORAL | 1 refills | Status: DC

## 2021-08-09 MED ORDER — INSULIN GLARGINE-YFGN 100 UNIT/ML ~~LOC~~ SOLN
24.0000 [IU] | Freq: Every day | SUBCUTANEOUS | Status: DC
  Filled 2021-08-09: qty 0.24

## 2021-08-09 NOTE — Progress Notes (Signed)
Patient discharged to home,. AVS reviewed

## 2021-08-09 NOTE — Discharge Summary (Signed)
Physician Discharge Summary  [name] [name]:[id number] DOB: 11/04/1955 DOA: 08/02/2021  PCP: [name], MD  Admit date: 08/02/2021 Discharge date: 08/09/2021  Time spent: 60 minutes  Recommendations for Outpatient Follow-up:  Follow-up with [name] [name], oncology in 1 week.  Office will call with appointment time. Follow-up with [name], MD in 2 to 3 weeks.  On follow-up patient's diabetes will need to be reassessed.  Patient will need a comprehensive metabolic profile, magnesium level, CBC checked to follow-up on electrolytes and renal function.  Patient blood pressure will need to be followed up upon.  Patient's statin was discontinued on discharge and will defer resumption on follow-up with PCP and hematology/oncology.   Discharge Diagnoses:  Principal Problem:   Transaminitis Active Problems:   Hypovolemic shock (HCC)   AKI (acute kidney injury) ([place])   Heart failure with reduced ejection fraction (HCC)   Iron deficiency anemia   Tobacco use disorder   Type II diabetes mellitus (HCC)   Depression   Hyperlipidemia   Primary hypertension   Non-small cell lung cancer (HCC)   Moderate protein-calorie malnutrition (HCC)   Stage 3b chronic kidney disease (HCC)   Normocytic anemia   Tobacco abuse, in remission   Liver function test abnormality   Long term systemic steroid user   Debility   Discharge Condition: Stable and improved  Diet recommendation: Carb modified diet  Filed Weights   08/02/21 1228  Weight: 51.7 kg    History of present illness:  HPI per [name] [name] is a 66 y.o. female with a past medical history of coronary artery disease, CHF with a reduced ejection fraction, chronic kidney disease, lung cancer, insulin-dependent diabetes mellitus type 2, hypertension, hyperlipidemia, history of right BKA, peripheral arterial disease, depression   This patient presents from the cancer center [name] [name] office with elevated LFTs, hypotension and  tachycardia.  She denies any cough, fevers or chills.  She denies any urinary complaints.  No abdominal pain.  No nausea vomiting or diarrhea.  States that she has been drinking okay but has no appetite.  Hypotension responded to IV fluid resuscitation in the emergency department.  Tachycardia now resolved.  In the emergency department EKG was concerning for some ST elevation in leads III, aVF and the ER provider did speak with [name] [name] felt this was not a STEMI and recommended IV fluids and a repeat EKG showed no ST elevations.   ED Course: Ultrasound showed no biliary obstruction.  Chest x-ray showed no infiltrates or consolidation consistent with pneumonia.  Creatinine 2.18 and AST 1230 and ALT 747, T. bili elevated 2.7.  Lactic acid normal.  INR unremarkable.  Initial troponin 24.  He has been given Solu-Medrol 50 mg IV x1.  He has also been given IV fluids with 1 L of normal saline as a bolus. Hospital Course:   Assessment and Plan: * Transaminitis- (present on admission) - Felt likely secondary to immunotherapy, grade 4 toxicity. -Patient noted to have had ultrasound done 07/28/2021 with no biliary obstruction. -Repeat right upper quadrant ultrasound unremarkable.. -Statin on hold.  -LFTs trending down -Oncology consulted recommended Solu-Medrol 1 mg/kg daily which patient was started on and dose adjusted to twice daily per oncology recommendations with goal to be less than 5 times upper limits of normal (AST <200, ALT < 220) and bili < 2, then subsequently transition to prednisone on discharge with outpatient follow-up with oncology for further taper.  -Patient improved clinically, LFTs trending down with  normalization of bilirubin, with transaminitis level seeming to plateau. -Patient was followed by [name] [name], oncology who felt patient was stable for discharge on prednisone 100 mg daily with PPI prophylaxis until outpatient follow-up for further tapering. -Patient will be discharged in stable  improved condition. -Supportive care.    Hypovolemic shock ([place])- (present on admission) - Hypovolemic shock felt likely secondary to volume depletion. -Blood pressure responded with IV fluids.   -No source of infection noted.  -Blood pressure improved and hypovolemic shock had resolved by day of discharge.  AKI (acute kidney injury) ([place])- (present on admission) - Secondary to prerenal azotemia secondary to volume depletion in the setting of hypovolemic shock. -Aldactone held initially on presentation but has subsequently been resumed. -Blood pressure improved with hydration. -Urinalysis with trace leukocytes, nitrite negative, 0-5 WBCs. -Renal function stabilized with creatinine at 0.90 on day of discharge. -Outpatient follow-up with PCP.  Normocytic anemia- (present on admission) - Hemoglobin remained stable at 8.3 by day of discharge.   -Patient with no signs of bleeding.   -Outpatient follow-up.    Stage 3b chronic kidney disease ([place])- (present on admission) - Creatinine elevated from baseline. -Renal function improved with hydration.   -Outpatient follow-up with PCP.  Moderate protein-calorie malnutrition ([place])- (present on admission) - Secondary to non-small cell lung cancer. -Nutritional supplementation.  Non-small cell lung cancer ([place])- (present on admission) - Being followed by oncology.   -Outpatient follow-up with oncology -  Primary hypertension- (present on admission) - Patient noted to be in hypovolemic shock on admission which responded to IV fluids. -BP improved. -Patient resumed back on home regimen of Isordil, Toprol-XL, Aldactone.   -Outpatient follow-up.    Hyperlipidemia- (present on admission) - Statin held during the hospitalization and discontinued on discharge.  -Outpatient follow-up with oncology, PCP who will determine when statin may be resumed.    Depression- (present on admission) - Patient maintained on home regimen [name] [name].   Type  II diabetes mellitus (HCC) - Hemoglobin A1c 7.8 (01/25/2021) -Repeat hemoglobin A1c 6.1 (08/04/2021). -Patient maintained on [name] [name] and dose increased to 24 units daily as well as 10 units NovoLog meal coverage as well as sliding scale insulin during the hospitalization for management of patient's CBGs which were elevated secondary to steroid treatment.   -Patient's blood sugars were controlled during the hospitalization patient was discharged home on Lantus 20 units daily, continuation of home regimen of NovoLog 8 units 3 times daily with meals in addition to resumption of oral hypoglycemic agents.   -Outpatient follow-up with PCP.   Tobacco use disorder- (present on admission) - Tobacco cessation.  Heart failure with reduced ejection fraction ([place])- (present on admission) - Stable. -Patient maintained on home regimen Toprol-XL, Isordil, Aldactone.          Procedures: Right upper quadrant ultrasound 08/03/2021 Chest x-ray 08/02/2021 Lower extremity Dopplers 08/02/2021  Consultations: Oncology: AlyssaYu    Discharge Exam: Vitals:   08/09/21 0839 08/09/21 1254  BP: (!) 143/78 105/67  Pulse: 87 79  Resp:  17  Temp: 98.3 F (36.8 C) 97.9 F (36.6 C)  SpO2: 100% 100%    General: NAD Cardiovascular: RRR no murmurs rubs or gallops.  No JVD.  No lower extremity edema. Respiratory: Clear to auscultation bilaterally.  No wheezes, no crackles, no rhonchi.  Discharge Instructions   Discharge Instructions     Ambulatory referral to Physical Therapy   Complete by: As directed    Diet Carb Modified   Complete by: As directed  Increase activity slowly   Complete by: As directed       Allergies as of 08/09/2021       Reactions   Lisinopril Swelling, Other (See Comments)   Recurrent AKI and hyperkalemia when initiation attempted.        Medication List     STOP taking these medications    atorvastatin 80 MG tablet Commonly known as: LIPITOR       TAKE these  medications    Accu-Chek Guide test strip Generic drug: glucose blood USE UP TP 4 TIMES A DAY AS DIRECTED   Accu-Chek Softclix Lancets lancets SMARTSIG:Topical 1-4 Times Daily   aspirin EC 81 MG tablet Take 81 mg by mouth in the morning. Swallow whole.   blood glucose meter kit and supplies Kit Dispense based on patient and insurance preference. Use up to four times daily as directed.   carboxymethylcellulose 0.5 % Soln Commonly known as: REFRESH PLUS Place 1 drop into both eyes in the morning and at bedtime.   diphenhydrAMINE 25 MG tablet Commonly known as: BENADRYL Take 25 mg by mouth at bedtime.   docusate sodium 100 MG capsule Commonly known as: COLACE Take 1 capsule (100 mg total) by mouth 2 (two) times daily.   dronabinol 2.5 MG capsule Commonly known as: MARINOL Take 1 capsule (2.5 mg total) by mouth 2 (two) times daily before a meal.   DULoxetine 20 MG capsule Commonly known as: [name] [name] TAKE 2 CAPSULES (40 MG TOTAL) BY MOUTH IN THE MORNING AND AT BEDTIME.   gabapentin 100 MG capsule Commonly known as: NEURONTIN Take 1 capsule (100 mg total) by mouth 2 (two) times daily.   HumaLOG KwikPen 100 UNIT/ML KwikPen Generic drug: insulin lispro Inject 8 Units into the skin 3 (three) times daily after meals.   isosorbide dinitrate 30 MG tablet Commonly known as: ISORDIL TAKE 1 TABLET (30 MG TOTAL) BY MOUTH IN THE MORNING, AT NOON, AND AT BEDTIME.   Jardiance 10 MG Tabs tablet Generic drug: empagliflozin TAKE 1 TABLET BY MOUTH EVERY DAY   ketoconazole 2 % cream Commonly known as: NIZORAL APPLY 1 APPLICATION TOPICALLY IN THE MORNING AND AT BEDTIME. APPLIED TO FEET   Lantus SoloStar 100 UNIT/ML Solostar Pen Generic drug: insulin glargine Inject 20 Units into the skin at bedtime. What changed:  how much to take how to take this   lidocaine-prilocaine cream Commonly known as: EMLA Apply 1 application topically as needed. Apply to port and cover with saran  wrap 1-2 hours prior to port access   metFORMIN 500 MG 24 hr tablet Commonly known as: GLUCOPHAGE-XR TAKE 1 TABLET BY MOUTH TWICE A DAY   metoprolol succinate 50 MG 24 hr tablet Commonly known as: TOPROL-XL Take 1 tablet (50 mg total) by mouth daily. Take with or immediately following a meal.   multivitamin with minerals Tabs tablet Take 1 tablet by mouth in the morning. Adults 50+   ondansetron 8 MG tablet Commonly known as: Zofran Take 1 tablet (8 mg total) by mouth 2 (two) times daily as needed (Nausea or vomiting). Start if needed on the third day after cisplatin.   pantoprazole 40 MG tablet Commonly known as: PROTONIX Take 1 tablet (40 mg total) by mouth daily. Start taking on: August 10, 2021   predniSONE 20 MG tablet Commonly known as: DELTASONE Take 5 tablets (100 mg total) by mouth daily with breakfast. Start taking on: August 10, 2021   prochlorperazine 10 MG tablet Commonly known as: COMPAZINE Take  1 tablet (10 mg total) by mouth every 6 (six) hours as needed (Nausea or vomiting).   spironolactone 25 MG tablet Commonly known as: ALDACTONE TAKE 1 TABLET (25 MG TOTAL) BY MOUTH DAILY.       Allergies  Allergen Reactions   Lisinopril Swelling and Other (See Comments)    Recurrent AKI and hyperkalemia when initiation attempted.    Follow-up Information     Earlie Server, MD. Schedule an appointment as soon as possible for a visit in 1 week(s).   Specialty: Oncology Why: Office will call with appointment time Contact information: [place] [address] [phone]         [name], MD. Schedule an appointment as soon as possible for a visit in 3 week(s).   Specialty: Family Medicine Why: Tye [place] at office stated they will notify patient of follow up appt Contact information: [place] [address] [phone]                  The results of significant diagnostics from this hospitalization  (including imaging, microbiology, ancillary and laboratory) are listed below for reference.    Significant Diagnostic Studies: US Venous Img Lower Bilateral  Result Date: 08/02/2021 CLINICAL DATA:  Shortness of breath EXAM: BILATERAL LOWER EXTREMITY VENOUS DOPPLER ULTRASOUND TECHNIQUE: Gray-scale sonography with graded compression, as well as color Doppler and duplex ultrasound were performed to evaluate the lower extremity deep venous systems from the level of the common femoral vein and including the common femoral, femoral, profunda femoral, popliteal and calf veins including the posterior tibial, peroneal and gastrocnemius veins when visible. The superficial great saphenous vein was also interrogated. Spectral Doppler was utilized to evaluate flow at rest and with distal augmentation maneuvers in the common femoral, femoral and popliteal veins. COMPARISON:  None. FINDINGS: RIGHT LOWER EXTREMITY Common Femoral Vein: No evidence of thrombus. Normal compressibility, respiratory phasicity and response to augmentation. Saphenofemoral Junction: No evidence of thrombus. Normal compressibility and flow on color Doppler imaging. Profunda Femoral Vein: No evidence of thrombus. Normal compressibility and flow on color Doppler imaging. Femoral Vein: No evidence of thrombus. Normal compressibility, respiratory phasicity and response to augmentation. Popliteal Vein: No evidence of thrombus. Normal compressibility, respiratory phasicity and response to augmentation. Calf Veins: Prior below-knee amputation. Superficial Great Saphenous Vein: No evidence of thrombus. Normal compressibility. Venous Reflux:  None. Other Findings:  None. LEFT LOWER EXTREMITY Common Femoral Vein: No evidence of thrombus. Normal compressibility, respiratory phasicity and response to augmentation. Saphenofemoral Junction: No evidence of thrombus. Normal compressibility and flow on color Doppler imaging. Profunda Femoral Vein: No evidence of  thrombus. Normal compressibility and flow on color Doppler imaging. Femoral Vein: No evidence of thrombus. Normal compressibility, respiratory phasicity and response to augmentation. Popliteal Vein: No evidence of thrombus. Normal compressibility, respiratory phasicity and response to augmentation. Calf Veins: No evidence of thrombus. Normal compressibility and flow on color Doppler imaging. Superficial Great Saphenous Vein: No evidence of thrombus. Normal compressibility. Venous Reflux:  None. Other Findings:  None. IMPRESSION: No evidence of deep venous thrombosis in either lower extremity. Electronically Signed   By: Maurine Simmering M.D.   On: 08/02/2021 15:37   DG Chest Portable 1 View  Result Date: 08/02/2021 CLINICAL DATA:  A 66 year old female presents for evaluation of shortness of breath. EXAM: PORTABLE CHEST 1 VIEW COMPARISON:  April 26, 2021. FINDINGS: RIGHT IJ Port-A-Cath with tip over the distal superior vena cava. EKG leads project over the chest. LEFT  hemidiaphragm remains elevated. Distortion of cardiomediastinal contours related to this hemidiaphragmatic elevation with similar appearance to prior imaging. Heart size and mediastinal contours are normal accounting for this finding. No lobar consolidation. No sign of effusion or pneumothorax. On limited assessment there is no acute skeletal process. IMPRESSION: 1. Chronic elevation of the LEFT hemidiaphragm without acute cardiopulmonary disease. 2. RIGHT IJ Port-A-Cath terminating in the area of the distal superior vena cava. Electronically Signed   By: Zetta Bills M.D.   On: 08/02/2021 14:52   IR IMAGING GUIDED PORT INSERTION  Result Date: 07/27/2021 INDICATION: History of lung cancer EXAM: Ultrasound-guided puncture of the right internal jugular vein Placement of a right-sided chest port using fluoroscopic guidance MEDICATIONS: None ANESTHESIA/SEDATION: Moderate (conscious) sedation was employed during this procedure. A total of Versed 1 mg  and Fentanyl 50 mcg was administered intravenously. Moderate Sedation Time: 20 minutes. The patient's level of consciousness and vital signs were monitored continuously by radiology nursing throughout the procedure under my direct supervision. FLUOROSCOPY TIME:  Fluoroscopy Time: 0.3 minutes (2.5 mGy) COMPLICATIONS: None immediate. PROCEDURE: Informed written consent was obtained from the patient after a thorough discussion of the procedural risks, benefits and alternatives. All questions were addressed. Maximal Sterile Barrier Technique was utilized including caps, mask, sterile gowns, sterile gloves, sterile drape, hand hygiene and skin antiseptic. A timeout was performed prior to the initiation of the procedure. The patient was placed supine on the exam table. The right neck and chest was prepped and draped in the standard sterile fashion. A preliminary ultrasound of the right neck was performed and demonstrates a patent right internal jugular vein. A permanent ultrasound image was stored in the electronic medical record. The overlying skin was anesthetized with 1% Lidocaine. Using ultrasound guidance, access was obtained into the right internal jugular vein using a 21 gauge micropuncture set. A wire was advanced into the SVC, a short incision was made at the puncture site, and serial dilatation performed. Next, in an ipsilateral infraclavicular location, an incision was made at the site of the subcutaneous reservoir. Blunt dissection was used to open a pocket to contain the reservoir. A subcutaneous tunnel was then created from the port site to the puncture site. A(n) 8 Fr single lumen catheter was advanced through the tunnel. The catheter was attached to the port and this was placed in the subcutaneous pocket. Under fluoroscopic guidance, a peel away sheath was placed, and the catheter was trimmed to the appropriate length and was advanced into the central veins. The catheter length is 22 cm. The tip of the  catheter lies near the superior cavoatrial junction. The port flushes and aspirates appropriately. The port was flushed and locked with heparinized saline. The port pocket was closed in 2 layers using 3-0 and 4-0 Vicryl/absorbable suture. Dermabond was also applied to both incisions. The patient tolerated the procedure well and was transferred to recovery in stable condition. IMPRESSION: Successful placement of a right chest port via the right internal jugular vein. The port is ready for immediate use. Electronically Signed   By: Albin Felling M.D.   On: 07/27/2021 12:30   US Abdomen Limited RUQ (LIVER/GB)  Result Date: 08/03/2021 CLINICAL DATA:  Transaminitis EXAM: ULTRASOUND ABDOMEN LIMITED RIGHT UPPER QUADRANT COMPARISON:  07/28/2021 FINDINGS: Gallbladder: Status post cholecystectomy Common bile duct: Diameter: 3 mm Liver: No focal lesion identified. Within normal limits in parenchymal echogenicity. Portal vein is patent on color Doppler imaging with normal direction of blood flow towards the liver. Other: None. IMPRESSION:  Normal sonographic appearance of the liver. Electronically Signed   By: Miachel Roux M.D.   On: 08/03/2021 08:37   US ABDOMEN LIMITED RUQ (LIVER/GB)  Result Date: 07/28/2021 CLINICAL DATA:  Elevated liver enzymes. EXAM: ULTRASOUND ABDOMEN LIMITED RIGHT UPPER QUADRANT COMPARISON:  November 17, 2020. FINDINGS: Gallbladder: Patient is post cholecystectomy as evidenced on prior CT imaging. Common bile duct: Diameter: 5.4 mm Liver: No focal lesion identified. Within normal limits in parenchymal echogenicity. Portal vein is patent on color Doppler imaging with normal direction of blood flow towards the liver. Other: None. IMPRESSION: Post cholecystectomy without signs of biliary duct distension. No parenchymal abnormalities noted in the liver. Electronically Signed   By: Zetta Bills M.D.   On: 07/28/2021 15:13    Microbiology: Recent Results (from the past 240 hour(s))  Resp Panel by  RT-PCR (Flu A&B, Covid) Nasopharyngeal Swab     Status: None   Collection Time: 08/02/21  1:41 PM   Specimen: Nasopharyngeal Swab; Nasopharyngeal(NP) swabs in vial transport medium  Result Value Ref Range Status   SARS Coronavirus 2 by RT PCR NEGATIVE NEGATIVE Final    Comment: (NOTE) SARS-CoV-2 target nucleic acids are NOT DETECTED.  The SARS-CoV-2 RNA is generally detectable in upper respiratory specimens during the acute phase of infection. The lowest concentration of SARS-CoV-2 viral copies this assay can detect is 138 copies/mL. A negative result does not preclude SARS-Cov-2 infection and should not be used as the sole basis for treatment or other patient management decisions. A negative result may occur with  improper specimen collection/handling, submission of specimen other than nasopharyngeal swab, presence of viral mutation(s) within the areas targeted by this assay, and inadequate number of viral copies(<138 copies/mL). A negative result must be combined with clinical observations, patient history, and epidemiological information. The expected result is Negative.  Fact Sheet for Patients:  EntrepreneurPulse.com.au  Fact Sheet for Healthcare Providers:  IncredibleEmployment.be  This test is no t yet approved or cleared by the [place] FDA and  has been authorized for detection and/or diagnosis of SARS-CoV-2 by FDA under an Emergency Use Authorization (EUA). This EUA will remain  in effect (meaning this test can be used) for the duration of the COVID-19 declaration under Section 564(b)(1) of the Act, 21 U.S.C.section 360bbb-3(b)(1), unless the authorization is terminated  or revoked sooner.       Influenza A by PCR NEGATIVE NEGATIVE Final   Influenza B by PCR NEGATIVE NEGATIVE Final    Comment: (NOTE) The Xpert Xpress SARS-CoV-2/FLU/RSV plus assay is intended as an aid in the diagnosis of influenza from Nasopharyngeal swab  specimens and should not be used as a sole basis for treatment. Nasal washings and aspirates are unacceptable for Xpert Xpress SARS-CoV-2/FLU/RSV testing.  Fact Sheet for Patients: EntrepreneurPulse.com.au  Fact Sheet for Healthcare Providers: IncredibleEmployment.be  This test is not yet approved or cleared by the [place] FDA and has been authorized for detection and/or diagnosis of SARS-CoV-2 by FDA under an Emergency Use Authorization (EUA). This EUA will remain in effect (meaning this test can be used) for the duration of the COVID-19 declaration under Section 564(b)(1) of the Act, 21 U.S.C. section 360bbb-3(b)(1), unless the authorization is terminated or revoked.  Performed at [facility], [address] [place], [place] [address]   Blood culture (routine x 2)     Status: None   Collection Time: 08/02/21  1:49 PM   Specimen: BLOOD  Result Value Ref Range Status   Specimen Description BLOOD LW  Final   Special Requests BOTTLES DRAWN AEROBIC AND ANAEROBIC BCAV  Final   Culture   Final    NO GROWTH 5 DAYS Performed at [facility], [address] [place], [place] [address]    Report Status 08/07/2021 FINAL  Final  Blood culture (routine x 2)     Status: None   Collection Time: 08/02/21  2:07 PM   Specimen: BLOOD  Result Value Ref Range Status   Specimen Description BLOOD [facility]  Final   Special Requests BOTTLES DRAWN AEROBIC AND ANAEROBIC BCAV  Final   Culture   Final    NO GROWTH 5 DAYS Performed at [facility], [address] [place], [place] [address]    Report Status 08/07/2021 FINAL  Final  Urine Culture     Status: None   Collection Time: 08/06/21 12:26 PM   Specimen: Urine, Clean Catch  Result Value Ref Range Status   Specimen Description   Final    URINE, CLEAN CATCH Performed at [facility], [address], [place], [place] [address]    Special Requests   Final     NONE Performed at [facility], [address], [place], [place] [address]    Culture   Final    NO GROWTH Performed at [facility], [place] [address], [place], [place] [address]    Report Status 08/07/2021 FINAL  Final     Labs: Basic Metabolic Panel: Recent Labs  Lab 08/04/21 0356 08/05/21 0413 08/06/21 0645 08/07/21 0600 08/08/21 0442 08/09/21 0455  NA 140 140 138 138 137 139  K 4.0 4.2 4.4 4.5 4.7 4.8  CL 114* 114* 109 107 107 106  CO2 20* _0 GLUCOSE 199* 181* 206* 344* 269* 205*  BUN 29* _1 25* 24*  CREATININE 1.46* 1.13* 1.12* 1.05* 1.08* 0.90  CALCIUM 8.1* 8.0* 8.3* 8.3* 8.4* 8.8*  MG 2.2 2.2 2.3  --  2.5*  --    Liver Function Tests: Recent Labs  Lab 08/05/21 0413 08/06/21 0645 08/07/21 0600 08/08/21 0442 08/09/21 0455  AST 431* 326* 261* 256* 278*  ALT 451* 423* 376* 372* 368*  ALKPHOS 842* 818* 712* 609* 553*  BILITOT 1.5* 1.4* 1.1 0.9 1.0  PROT 5.4* 5.7* 5.1* 5.7* 5.6*  ALBUMIN 2.5* 2.6* 2.6* 2.5* 2.7*   No results for input(s): LIPASE, AMYLASE in the last 168 hours. No results for input(s): AMMONIA in the last 168 hours. CBC: Recent Labs  Lab 08/04/21 0356 08/05/21 0413 08/06/21 0645 08/08/21 0442 08/09/21 0455  WBC 5.1 4.2 4.2 5.1 6.2  NEUTROABS 4.4  --   --   --   --   HGB 8.6* 8.3* 8.1* 7.8* 8.3*  HCT 25.9* 25.4* 24.8* 25.1* 26.5*  MCV 94.5 95.1 96.9 98.4 97.8  PLT 285 267 241 245 253   Cardiac Enzymes: No results for input(s): CKTOTAL, CKMB, CKMBINDEX, TROPONINI in the last 168 hours. BNP: BNP (last 3 results) No results for input(s): BNP in the last 8760 hours.  ProBNP (last 3 results) No results for input(s): PROBNP in the last 8760 hours.  CBG: Recent Labs  Lab 08/08/21 1617 08/08/21 2037 08/09/21 0830 08/09/21 0841 08/09/21 1254  GLUCAP 316* 214* 124* 140* 189*       Signed:  Irine Seal MD.  [facility] 08/09/2021, 3:39 PM

## 2021-08-09 NOTE — Progress Notes (Signed)
Hematology/Oncology Progress note [phone] [phone]     Patient Care Team: [name], MD as PCP - General (Family Medicine) Earlie Server, MD as Consulting Physician (Hematology and Oncology)   Name of the patient: [name] [name]  [id number]  04-23-1956  Date of visit: 08/09/21   INTERVAL HISTORY-  Patient reports feeling well.  Nausea has improved.  Transaminitis is improving. No new complaints.     Allergies  Allergen Reactions   Lisinopril Swelling and Other (See Comments)    Recurrent AKI and hyperkalemia when initiation attempted.    Patient Active Problem List   Diagnosis Date Noted   Liver function test abnormality    Long term systemic steroid user    Hypovolemic shock ([place]) 08/03/2021   AKI (acute kidney injury) ([place]) 08/03/2021   Transaminitis 08/02/2021   Chemotherapy-induced nausea 07/28/2021   Encounter for antineoplastic immunotherapy 07/07/2021   Macrocytic anemia 07/07/2021   Moderate protein-calorie malnutrition ([place]) 04/12/2021   Stage 3b chronic kidney disease ([place]) 04/12/2021   Normocytic anemia 04/12/2021   Dyslipidemia 04/05/2021   Encounter for antineoplastic chemotherapy 02/28/2021   Non-small cell cancer of left lung ([place]) 02/17/2021   Tinea pedis of both feet 02/10/2021   Chronic pain due to neoplasm 02/10/2021   Adenocarcinoma, lung, left ([place]) 01/19/2021   S/P Robotic Assisted Video Thoracoscopy with Left Lower Lobectomy Lung, Intercostal nerve block, lymph node dissection 01/17/2021   Non-small cell lung cancer ([place]) 12/30/2020   Depression 12/09/2020   Hyperlipidemia 12/09/2020   Primary hypertension 12/09/2020   Heart failure with reduced ejection fraction ([place]) 02/16/2020   Multiple subsegmental pulmonary emboli without acute cor pulmonale ([place]) 02/15/2020   Iron deficiency anemia 12/18/2019   Rectal pain 12/18/2019   Diabetic foot ulcer ([place]) 10/03/2019   Cellulitis 08/17/2019   Cocaine use 08/17/2019    Peripheral vascular disease ([place]) 08/17/2019   Tobacco use disorder 08/17/2019   Tobacco abuse, in remission 08/17/2019   Cervical dysplasia 04/02/2019   CAD (coronary artery disease), native coronary artery 06/06/2010   Type II diabetes mellitus ([place]) 06/06/2010   Acute subendocardial infarction, initial episode of care ([place]) 06/05/2010      Physical exam:  Vitals:   08/08/21 1616 08/08/21 2037 08/09/21 0520 08/09/21 0839  BP: 107/73 (!) 149/81 (!) 173/93 (!) 143/78  Pulse: 84 78 70 87  Resp: 17  18   Temp: 98.1 F (36.7 C)  98.6 F (37 C) 98.3 F (36.8 C)  TempSrc: Oral   Oral  SpO2: 100% 100% 100% 100%  Weight:      Height:       Physical Exam Constitutional:      General: She is not in acute distress.    Appearance: She is not diaphoretic.  HENT:     Nose: Nose normal.     Mouth/Throat:     Pharynx: No oropharyngeal exudate.  Eyes:     General: No scleral icterus.    Pupils: Pupils are equal, round, and reactive to light.  Cardiovascular:     Rate and Rhythm: Normal rate and regular rhythm.     Heart sounds: No murmur heard. Pulmonary:     Effort: Pulmonary effort is normal. No respiratory distress.     Breath sounds: No rales.  Chest:     Chest wall: No tenderness.  Abdominal:     General: There is no distension.     Palpations: Abdomen is soft.     Tenderness: There is no abdominal tenderness.  Musculoskeletal:        General: Normal range of motion.     Cervical back: Normal range of motion and neck supple.  Skin:    General: Skin is warm and dry.     Findings: No erythema.  Neurological:     Mental Status: She is alert and oriented to person, place, and time.     Cranial Nerves: No cranial nerve deficit.     Motor: No abnormal muscle tone.     Coordination: Coordination normal.  Psychiatric:        Mood and Affect: Mood and affect normal.       CMP Latest Ref Rng & Units 08/09/2021  Glucose 70 - 99 mg/dL 205(H)  BUN 8 - 23 mg/dL 24(H)   Creatinine 0.44 - 1.00 mg/dL 0.90  Sodium 135 - 145 mmol/L 139  Potassium 3.5 - 5.1 mmol/L 4.8  Chloride 98 - 111 mmol/L 106  CO2 22 - 32 mmol/L 26  Calcium 8.9 - 10.3 mg/dL 8.8(L)  Total Protein 6.5 - 8.1 g/dL 5.6(L)  Total Bilirubin 0.3 - 1.2 mg/dL 1.0  Alkaline Phos 38 - 126 U/L 553(H)  AST 15 - 41 U/L 278(H)  ALT 0 - 44 U/L 368(H)   CBC Latest Ref Rng & Units 08/09/2021  WBC 4.0 - 10.5 K/uL 6.2  Hemoglobin 12.0 - 15.0 g/dL 8.3(L)  Hematocrit 36.0 - 46.0 % 26.5(L)  Platelets 150 - 400 K/uL 253    RADIOGRAPHIC STUDIES: I have personally reviewed the radiological images as listed and agreed with the findings in the report. US Venous Img Lower Bilateral  Result Date: 08/02/2021 CLINICAL DATA:  Shortness of breath EXAM: BILATERAL LOWER EXTREMITY VENOUS DOPPLER ULTRASOUND TECHNIQUE: Gray-scale sonography with graded compression, as well as color Doppler and duplex ultrasound were performed to evaluate the lower extremity deep venous systems from the level of the common femoral vein and including the common femoral, femoral, profunda femoral, popliteal and calf veins including the posterior tibial, peroneal and gastrocnemius veins when visible. The superficial great saphenous vein was also interrogated. Spectral Doppler was utilized to evaluate flow at rest and with distal augmentation maneuvers in the common femoral, femoral and popliteal veins. COMPARISON:  None. FINDINGS: RIGHT LOWER EXTREMITY Common Femoral Vein: No evidence of thrombus. Normal compressibility, respiratory phasicity and response to augmentation. Saphenofemoral Junction: No evidence of thrombus. Normal compressibility and flow on color Doppler imaging. Profunda Femoral Vein: No evidence of thrombus. Normal compressibility and flow on color Doppler imaging. Femoral Vein: No evidence of thrombus. Normal compressibility, respiratory phasicity and response to augmentation. Popliteal Vein: No evidence of thrombus. Normal  compressibility, respiratory phasicity and response to augmentation. Calf Veins: Prior below-knee amputation. Superficial Great Saphenous Vein: No evidence of thrombus. Normal compressibility. Venous Reflux:  None. Other Findings:  None. LEFT LOWER EXTREMITY Common Femoral Vein: No evidence of thrombus. Normal compressibility, respiratory phasicity and response to augmentation. Saphenofemoral Junction: No evidence of thrombus. Normal compressibility and flow on color Doppler imaging. Profunda Femoral Vein: No evidence of thrombus. Normal compressibility and flow on color Doppler imaging. Femoral Vein: No evidence of thrombus. Normal compressibility, respiratory phasicity and response to augmentation. Popliteal Vein: No evidence of thrombus. Normal compressibility, respiratory phasicity and response to augmentation. Calf Veins: No evidence of thrombus. Normal compressibility and flow on color Doppler imaging. Superficial Great Saphenous Vein: No evidence of thrombus. Normal compressibility. Venous Reflux:  None. Other Findings:  None. IMPRESSION: No evidence of deep venous thrombosis in either lower extremity. Electronically Signed  By: Maurine Simmering M.D.   On: 08/02/2021 15:37   DG Chest Portable 1 View  Result Date: 08/02/2021 CLINICAL DATA:  A 66 year old female presents for evaluation of shortness of breath. EXAM: PORTABLE CHEST 1 VIEW COMPARISON:  April 26, 2021. FINDINGS: RIGHT IJ Port-A-Cath with tip over the distal superior vena cava. EKG leads project over the chest. LEFT hemidiaphragm remains elevated. Distortion of cardiomediastinal contours related to this hemidiaphragmatic elevation with similar appearance to prior imaging. Heart size and mediastinal contours are normal accounting for this finding. No lobar consolidation. No sign of effusion or pneumothorax. On limited assessment there is no acute skeletal process. IMPRESSION: 1. Chronic elevation of the LEFT hemidiaphragm without acute  cardiopulmonary disease. 2. RIGHT IJ Port-A-Cath terminating in the area of the distal superior vena cava. Electronically Signed   By: Zetta Bills M.D.   On: 08/02/2021 14:52   IR IMAGING GUIDED PORT INSERTION  Result Date: 07/27/2021 INDICATION: History of lung cancer EXAM: Ultrasound-guided puncture of the right internal jugular vein Placement of a right-sided chest port using fluoroscopic guidance MEDICATIONS: None ANESTHESIA/SEDATION: Moderate (conscious) sedation was employed during this procedure. A total of Versed 1 mg and Fentanyl 50 mcg was administered intravenously. Moderate Sedation Time: 20 minutes. The patient's level of consciousness and vital signs were monitored continuously by radiology nursing throughout the procedure under my direct supervision. FLUOROSCOPY TIME:  Fluoroscopy Time: 0.3 minutes (2.5 mGy) COMPLICATIONS: None immediate. PROCEDURE: Informed written consent was obtained from the patient after a thorough discussion of the procedural risks, benefits and alternatives. All questions were addressed. Maximal Sterile Barrier Technique was utilized including caps, mask, sterile gowns, sterile gloves, sterile drape, hand hygiene and skin antiseptic. A timeout was performed prior to the initiation of the procedure. The patient was placed supine on the exam table. The right neck and chest was prepped and draped in the standard sterile fashion. A preliminary ultrasound of the right neck was performed and demonstrates a patent right internal jugular vein. A permanent ultrasound image was stored in the electronic medical record. The overlying skin was anesthetized with 1% Lidocaine. Using ultrasound guidance, access was obtained into the right internal jugular vein using a 21 gauge micropuncture set. A wire was advanced into the SVC, a short incision was made at the puncture site, and serial dilatation performed. Next, in an ipsilateral infraclavicular location, an incision was made at the  site of the subcutaneous reservoir. Blunt dissection was used to open a pocket to contain the reservoir. A subcutaneous tunnel was then created from the port site to the puncture site. A(n) 8 Fr single lumen catheter was advanced through the tunnel. The catheter was attached to the port and this was placed in the subcutaneous pocket. Under fluoroscopic guidance, a peel away sheath was placed, and the catheter was trimmed to the appropriate length and was advanced into the central veins. The catheter length is 22 cm. The tip of the catheter lies near the superior cavoatrial junction. The port flushes and aspirates appropriately. The port was flushed and locked with heparinized saline. The port pocket was closed in 2 layers using 3-0 and 4-0 Vicryl/absorbable suture. Dermabond was also applied to both incisions. The patient tolerated the procedure well and was transferred to recovery in stable condition. IMPRESSION: Successful placement of a right chest port via the right internal jugular vein. The port is ready for immediate use. Electronically Signed   By: Albin Felling M.D.   On: 07/27/2021 12:30   US Abdomen  Limited RUQ (LIVER/GB)  Result Date: 08/03/2021 CLINICAL DATA:  Transaminitis EXAM: ULTRASOUND ABDOMEN LIMITED RIGHT UPPER QUADRANT COMPARISON:  07/28/2021 FINDINGS: Gallbladder: Status post cholecystectomy Common bile duct: Diameter: 3 mm Liver: No focal lesion identified. Within normal limits in parenchymal echogenicity. Portal vein is patent on color Doppler imaging with normal direction of blood flow towards the liver. Other: None. IMPRESSION: Normal sonographic appearance of the liver. Electronically Signed   By: Miachel Roux M.D.   On: 08/03/2021 08:37   US ABDOMEN LIMITED RUQ (LIVER/GB)  Result Date: 07/28/2021 CLINICAL DATA:  Elevated liver enzymes. EXAM: ULTRASOUND ABDOMEN LIMITED RIGHT UPPER QUADRANT COMPARISON:  November 17, 2020. FINDINGS: Gallbladder: Patient is post cholecystectomy as  evidenced on prior CT imaging. Common bile duct: Diameter: 5.4 mm Liver: No focal lesion identified. Within normal limits in parenchymal echogenicity. Portal vein is patent on color Doppler imaging with normal direction of blood flow towards the liver. Other: None. IMPRESSION: Post cholecystectomy without signs of biliary duct distension. No parenchymal abnormalities noted in the liver. Electronically Signed   By: Zetta Bills M.D.   On: 07/28/2021 15:13    Assessment and plan-   #Acute transaminitis, hyperbilirubinemia, secondary to immunotherapy, Grade 4 toxicity Transaminitis and bilirubin continues to improve. It seems that her bilirubin has normalized, transaminitis level has plateaued, not further decreasing.  She is otherwise doing very well.  I am ok for her to go home today, please discharge her on prednisone 100mg  daily, with PPI prophylaxis.  Our office will contact her to get labs weekly and I will taper her steroid over the next 8-10 weeks. Communicated with hospitalist Dr.Thompson. Patient agrees with the plan.   # AKI, resolved. # Non small cell lung cancer, outpatient follow up  Thank you for allowing me to participate in the care of this patient.   Earlie Server, MD, PhD Hematology Oncology 08/09/2021

## 2021-08-10 ENCOUNTER — Telehealth: Payer: Self-pay

## 2021-08-10 DIAGNOSIS — C3492 Malignant neoplasm of unspecified part of left bronchus or lung: Secondary | ICD-10-CM

## 2021-08-10 NOTE — Telephone Encounter (Signed)
-----   Message from [name] Server, MD sent at 08/10/2021  9:10 AM EST ----- Please schedule her to see NP with labs CMP on 2/27.  Lab cbc cmp with MD around 3/6

## 2021-08-10 NOTE — Telephone Encounter (Signed)
Transition Care Management Follow-up Telephone Call Date of discharge and from where: 08/09/21 [facility] How have you been since you were released from the hospital? Pt states she is doing pretty good Any questions or concerns? No  Items Reviewed: Did the pt receive and understand the discharge instructions provided? Yes  Medications obtained and verified? Yes  Other? No  Any new allergies since your discharge? No  Dietary orders reviewed? Yes Do you have support at home? Yes   Home Care and Equipment/Supplies: Were home health services ordered? no  Were any new equipment or medical supplies ordered?  No   Functional Questionnaire: (I = Independent and D = Dependent) ADLs: I  Bathing/Dressing- I  Meal Prep- I  Eating- I  Maintaining continence- I  Transferring/Ambulation- I  Managing Meds- I  Follow up appointments reviewed:  PCP Hospital f/u appt confirmed? Yes  Scheduled to see Dr. Ronnald Ramp on 08/22/21 @ 3:00. [facility] f/u appt confirmed? Yes  Scheduled to see Dr. [name] on 08/22/21 @ 2:00. Are transportation arrangements needed? No  If their condition worsens, is the pt aware to call PCP or go to the Emergency Dept.? Yes Was the patient provided with contact information for the PCP's office or ED? Yes Was to pt encouraged to call back with questions or concerns? Yes

## 2021-08-10 NOTE — Telephone Encounter (Signed)
Transition Care Management Unsuccessful Follow-up Telephone Call  Date of discharge and from where:  08/09/2021-[facility]  Attempts:  1st Attempt  Reason for unsuccessful TCM follow-up call:  Left voice message

## 2021-08-10 NOTE — Telephone Encounter (Signed)
Unable to reach pt, left a VM with appt details. Encouraged to call back if she has questions or needs to reschedule.

## 2021-08-10 NOTE — Telephone Encounter (Signed)
Appt reminder mailed.

## 2021-08-11 NOTE — Telephone Encounter (Signed)
Transition Care Management Unsuccessful Follow-up Telephone Call  Date of discharge and from where:  08/09/2021 from [facility]  Attempts:  2nd Attempt  Reason for unsuccessful TCM follow-up call:  Left voice message

## 2021-08-12 DIAGNOSIS — N3942 Incontinence without sensory awareness: Secondary | ICD-10-CM | POA: Diagnosis not present

## 2021-08-12 DIAGNOSIS — E119 Type 2 diabetes mellitus without complications: Secondary | ICD-10-CM | POA: Diagnosis not present

## 2021-08-12 NOTE — Telephone Encounter (Signed)
TOC completed by LPN

## 2021-08-15 ENCOUNTER — Inpatient Hospital Stay (HOSPITAL_BASED_OUTPATIENT_CLINIC_OR_DEPARTMENT_OTHER): Payer: Medicaid Other | Admitting: Nurse Practitioner

## 2021-08-15 ENCOUNTER — Inpatient Hospital Stay: Payer: Medicaid Other

## 2021-08-15 ENCOUNTER — Other Ambulatory Visit: Payer: Self-pay

## 2021-08-15 ENCOUNTER — Encounter: Payer: Self-pay | Admitting: Nurse Practitioner

## 2021-08-15 VITALS — BP 104/61 | HR 82

## 2021-08-15 VITALS — BP 79/53 | HR 113 | Temp 98.2°F | Wt 116.7 lb

## 2021-08-15 DIAGNOSIS — I959 Hypotension, unspecified: Secondary | ICD-10-CM

## 2021-08-15 DIAGNOSIS — C3492 Malignant neoplasm of unspecified part of left bronchus or lung: Secondary | ICD-10-CM

## 2021-08-15 DIAGNOSIS — E440 Moderate protein-calorie malnutrition: Secondary | ICD-10-CM

## 2021-08-15 DIAGNOSIS — N179 Acute kidney failure, unspecified: Secondary | ICD-10-CM | POA: Diagnosis not present

## 2021-08-15 DIAGNOSIS — R7401 Elevation of levels of liver transaminase levels: Secondary | ICD-10-CM | POA: Diagnosis not present

## 2021-08-15 DIAGNOSIS — Z9289 Personal history of other medical treatment: Secondary | ICD-10-CM

## 2021-08-15 DIAGNOSIS — R000 Tachycardia, unspecified: Secondary | ICD-10-CM

## 2021-08-15 DIAGNOSIS — C3432 Malignant neoplasm of lower lobe, left bronchus or lung: Secondary | ICD-10-CM | POA: Diagnosis not present

## 2021-08-15 LAB — COMPREHENSIVE METABOLIC PANEL
ALT: 237 U/L — ABNORMAL HIGH (ref 0–44)
AST: 118 U/L — ABNORMAL HIGH (ref 15–41)
Albumin: 3.1 g/dL — ABNORMAL LOW (ref 3.5–5.0)
Alkaline Phosphatase: 445 U/L — ABNORMAL HIGH (ref 38–126)
Anion gap: 12 (ref 5–15)
BUN: 56 mg/dL — ABNORMAL HIGH (ref 8–23)
CO2: 25 mmol/L (ref 22–32)
Calcium: 8.4 mg/dL — ABNORMAL LOW (ref 8.9–10.3)
Chloride: 101 mmol/L (ref 98–111)
Creatinine, Ser: 2.04 mg/dL — ABNORMAL HIGH (ref 0.44–1.00)
GFR, Estimated: 27 mL/min — ABNORMAL LOW (ref >60)
Glucose, Bld: 125 mg/dL — ABNORMAL HIGH (ref 70–99)
Potassium: 3.6 mmol/L (ref 3.5–5.1)
Sodium: 138 mmol/L (ref 135–145)
Total Bilirubin: 0.6 mg/dL (ref 0.3–1.2)
Total Protein: 6.3 g/dL — ABNORMAL LOW (ref 6.5–8.1)

## 2021-08-15 MED ORDER — SODIUM CHLORIDE 0.9% FLUSH
10.0000 mL | INTRAVENOUS | Status: DC | PRN
  Administered 2021-08-15: 10 mL via INTRAVENOUS
  Filled 2021-08-15: qty 10

## 2021-08-15 MED ORDER — SODIUM CHLORIDE 0.9 % IV SOLN
Freq: Once | INTRAVENOUS | Status: AC
  Filled 2021-08-15: qty 250

## 2021-08-15 MED ORDER — HEPARIN SOD (PORK) LOCK FLUSH 100 UNIT/ML IV SOLN
500.0000 [IU] | Freq: Once | INTRAVENOUS | Status: AC
  Administered 2021-08-15: 500 [IU] via INTRAVENOUS
  Filled 2021-08-15: qty 5

## 2021-08-15 MED ORDER — PREDNISONE 20 MG PO TABS: 80.0000 mg | ORAL_TABLET | Freq: Every day | ORAL | 0 refills | Status: DC

## 2021-08-15 NOTE — Progress Notes (Signed)
Hematology/Oncology Progress note [phone] [phone]    Patient Care Team: [name], MD as PCP - General (Family Medicine) Earlie Server, MD as Consulting Physician (Hematology and Oncology)  REFERRING PROVIDER: [name], MD   CHIEF COMPLAINTS/REASON FOR VISIT:  Follow up for stage IIIA lung cancer  HISTORY OF PRESENTING ILLNESS:  Patient was recently seen by urology Dr. Candiss Norse ski for evaluation of microscopic hematuria, frequent UTI. 11/17/2020, CT hematuria work-up was obtained which showed no evidence of urinary tract calculus or hydronephrosis.  Small left renal cyst. Incidental findings of 1.9 x 1.2 cm left lower lobe pulmonary nodule worrisome for malignancy. Patient was referred to establish care with oncology for further evaluation  .  Patient denies any shortness of breath, cough, hemoptysis, unintentional weight loss, fever or night sweats. She currently smokes 1 cigar/day.  She started smoking cigarettes at age of 65.  She quitted at age of 46 and restarted smoking cigar a few years ago.  Patient has a history of depression and is on Cymbalta.  Diabetes, hyperlipidemia, hypertension, peripheral artery disease Per patient she also has history of heart attack in 2011. History of right lower extremity BKA in 2021, 02/15/2020 history of pulmonary embolism involving segmental and subsegmental branches of right upper, middle, lower lobe pulmonary arteries.  She was put on on apixaban. Patient lives with her aunt.  12/07/2020, PET scan showed left lower lobe 1.3 x 1.8 cm lung nodule with SUV of 6.5.  Suspect early stage primary lung neoplasm.  No hilar or mediastinal lymphadenopathy activity.  # 12/23/2020 S/p CT guided biopsy of left lower lobe mass.  Pathology showed non-small cell carcinoma, favor adenocarcinoma.  Positive for TTF-1, negative for p40.     # 01/17/2021, patient underwent left lower lobectomy with lymph node dissection. Pathology  showed invasive poorly differentiated adenocarcinoma, 2.2 cm.  Tumor abuts pleura but visceral pleural surface is not involved by carcinoma.  LVI invasion is present, resection margins are negative for carcinoma.  10 lymph nodes were harvested and 3 lymph nodes were involved with carcinoma. pT1c pN2 Patient's case was discussed at [facility] oncology tumor board.  And consensus recommendation was adjuvant chemotherapy and sent tissue for molecular/PD-L1 testing.  Foundation one testing showed PD-L1 1,  No reportable alterations ERBB2 [id number]  # 02/28/2021 adjuvant carboplatin and Taxol 07/29/2021 Medi port placed by IR.   INTERVAL HISTORY [name] [name] is a 66 y.o. female who has above history reviewed by me today presents for follow up visit for Stage IIIA lung cancer. In the interim, she was admitted to hospital for elevated LFTs, hypotension, tachycardia, AKI. She reports feeling well today and denies psecific complaints. Has been taking her blood pressure medication. Has some nausea but reports good oral intake.    Review of Systems  Constitutional:  Positive for appetite change and fatigue. Negative for chills and fever.  HENT:   Negative for hearing loss and voice change.   Eyes:  Negative for eye problems.  Respiratory:  Negative for chest tightness and cough.   Cardiovascular:  Negative for chest pain.  Gastrointestinal:  Positive for nausea. Negative for abdominal distention, abdominal pain, blood in stool and diarrhea.  Endocrine: Negative for hot flashes.  Genitourinary:  Negative for difficulty urinating, dysuria and frequency.   Musculoskeletal:  Negative for arthralgias.  Skin:  Negative for itching and rash.  Neurological:  Negative for extremity weakness.  Hematological:  Negative for adenopathy.  Psychiatric/Behavioral:  Negative for confusion and  depression.    MEDICAL HISTORY:  Past Medical History:  Diagnosis Date   Anemia    Cancer ([place])    CHF (congestive heart  failure) ([place])    02/16/20 HFrEF 20-25% in setting of acute DVT/PE, underlying CAD   CKD (chronic kidney disease)    Coronary artery disease    02/20/20: CTO pRCA with left-to-right collaterals, 50% mLAD, 80% OM1, medical therapy   Depression    Diabetes ([place])    Diabetes mellitus without complication ([place])    DVT (deep venous thrombosis) ([place]) 02/16/2020   RLE DVT proximal veins 02/16/20 Duplex   Hyperlipidemia    Hypertension    Myocardial infarction [facility]) 2009   Stress related per patient; NSTEMI 2012 100% RCA with left-to-right collaterals   PE (pulmonary thromboembolism) ([place]) 02/15/2020   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, RLE BKA   Peripheral artery disease ([place])     SURGICAL HISTORY: Past Surgical History:  Procedure Laterality Date   IR IMAGING GUIDED PORT INSERTION  07/27/2021   Right lower extremity BKA Right     SOCIAL HISTORY: Social History   Socioeconomic History   Marital status: Married    Spouse name: Not on file   Number of children: Not on file   Years of education: Not on file   Highest education level: Not on file  Occupational History   Not on file  Tobacco Use   Smoking status: Former    Packs/day: 0.25    Types: Cigars, Cigarettes    Quit date: 01/17/2021    Years since quitting: 0.5   Smokeless tobacco: Never  Vaping Use   Vaping Use: Never used  Substance and Sexual Activity   Alcohol use: Never   Drug use: Not Currently   Sexual activity: Not Currently  Other Topics Concern   Not on file  Social History Narrative   Not on file   Social Determinants of Health   Financial Resource Strain: Not on file  Food Insecurity: Not on file  Transportation Needs: Not on file  Physical Activity: Not on file  Stress: Not on file  Social Connections: Not on file  Intimate Partner Violence: Not on file    FAMILY HISTORY: Family History  Problem Relation Age of Onset   Heart disease Mother    Diabetes Mother    Heart disease  Father    Diabetes Father     ALLERGIES:  is allergic to lisinopril.  MEDICATIONS:  Current Outpatient Medications  Medication Sig Dispense Refill   ACCU-CHEK GUIDE test strip USE UP TP 4 TIMES A DAY AS DIRECTED 100 strip 1   Accu-Chek Softclix Lancets lancets SMARTSIG:Topical 1-4 Times Daily     aspirin EC 81 MG tablet Take 81 mg by mouth in the morning. Swallow whole.     blood glucose meter kit and supplies KIT Dispense based on patient and insurance preference. Use up to four times daily as directed. 1 each 0   carboxymethylcellulose (REFRESH PLUS) 0.5 % SOLN Place 1 drop into both eyes in the morning and at bedtime.     dronabinol (MARINOL) 2.5 MG capsule Take 1 capsule (2.5 mg total) by mouth 2 (two) times daily before a meal. 60 capsule 0   DULoxetine (CYMBALTA) 20 MG capsule TAKE 2 CAPSULES (40 MG TOTAL) BY MOUTH IN THE MORNING AND AT BEDTIME. 180 capsule 2   gabapentin (NEURONTIN) 100 MG capsule Take 1 capsule (100 mg total) by mouth 2 (two) times daily.  60 capsule 5   HUMALOG KWIKPEN 100 UNIT/ML KwikPen Inject 8 Units into the skin 3 (three) times daily after meals.     isosorbide dinitrate (ISORDIL) 30 MG tablet TAKE 1 TABLET (30 MG TOTAL) BY MOUTH IN THE MORNING, AT NOON, AND AT BEDTIME. 90 tablet 0   JARDIANCE 10 MG TABS tablet TAKE 1 TABLET BY MOUTH EVERY DAY 90 tablet 0   ketoconazole (NIZORAL) 2 % cream APPLY 1 APPLICATION TOPICALLY IN THE MORNING AND AT BEDTIME. APPLIED TO FEET 60 g 1   LANTUS SOLOSTAR 100 UNIT/ML Solostar Pen Inject 20 Units into the skin at bedtime. 15 mL 0   lidocaine-prilocaine (EMLA) cream Apply 1 application topically as needed. Apply to port and cover with saran wrap 1-2 hours prior to port access 30 g 1   metFORMIN (GLUCOPHAGE-XR) 500 MG 24 hr tablet TAKE 1 TABLET BY MOUTH TWICE A DAY 180 tablet 2   metoprolol succinate (TOPROL-XL) 50 MG 24 hr tablet Take 1 tablet (50 mg total) by mouth daily. Take with or immediately following a meal. 90 tablet 1    Multiple Vitamin (MULTIVITAMIN WITH MINERALS) TABS tablet Take 1 tablet by mouth in the morning. Adults 50+     ondansetron (ZOFRAN) 8 MG tablet Take 1 tablet (8 mg total) by mouth 2 (two) times daily as needed (Nausea or vomiting). Start if needed on the third day after cisplatin. 30 tablet 1   pantoprazole (PROTONIX) 40 MG tablet Take 1 tablet (40 mg total) by mouth daily. 30 tablet 1   predniSONE (DELTASONE) 20 MG tablet Take 5 tablets (100 mg total) by mouth daily with breakfast. 40 tablet 0   prochlorperazine (COMPAZINE) 10 MG tablet Take 1 tablet (10 mg total) by mouth every 6 (six) hours as needed (Nausea or vomiting). 30 tablet 1   spironolactone (ALDACTONE) 25 MG tablet TAKE 1 TABLET (25 MG TOTAL) BY MOUTH DAILY. 90 tablet 0   diphenhydrAMINE (BENADRYL) 25 MG tablet Take 25 mg by mouth at bedtime. (Patient not taking: Reported on 08/15/2021)     docusate sodium (COLACE) 100 MG capsule Take 1 capsule (100 mg total) by mouth 2 (two) times daily. (Patient not taking: Reported on 08/15/2021) 60 capsule 1   No current facility-administered medications for this visit.     PHYSICAL EXAMINATION: ECOG PERFORMANCE STATUS: 1 - Symptomatic but completely ambulatory Vitals:   08/15/21 1426  BP: (!) 79/53  Pulse: (!) 113  Temp: 98.2 F (36.8 C)  SpO2: 100%   Filed Weights   08/15/21 1426  Weight: 116 lb 11.2 oz (52.9 kg)    Physical Exam Constitutional:      General: She is not in acute distress.    Appearance: She is not ill-appearing.  HENT:     Head: Normocephalic and atraumatic.  Eyes:     General: No scleral icterus. Cardiovascular:     Rate and Rhythm: Normal rate and regular rhythm.     Heart sounds: Normal heart sounds.  Pulmonary:     Effort: Pulmonary effort is normal. No respiratory distress.     Breath sounds: No wheezing.     Comments: Absent breath sounds left basilar Abdominal:     General: Bowel sounds are normal. There is no distension.     Palpations: Abdomen  is soft.  Musculoskeletal:        General: No deformity. Normal range of motion.     Cervical back: Normal range of motion and neck supple.  Comments: Right lower extremity BKA  Skin:    General: Skin is warm and dry.     Findings: No erythema or rash.  Neurological:     Mental Status: She is alert and oriented to person, place, and time. Mental status is at baseline.     Cranial Nerves: No cranial nerve deficit.     Coordination: Coordination normal.  Psychiatric:        Mood and Affect: Mood normal.    LABORATORY DATA:  I have reviewed the data as listed Lab Results  Component Value Date   WBC 6.2 08/09/2021   HGB 8.3 (L) 08/09/2021   HCT 26.5 (L) 08/09/2021   MCV 97.8 08/09/2021   PLT 253 08/09/2021   Recent Labs    08/02/21 1106 08/02/21 1108 08/08/21 0442 08/09/21 0455 08/15/21 1405  NA  --    < > 137 139 138  K  --    < > 4.7 4.8 3.6  CL  --    < > 107 106 101  CO2  --    < > _0 GLUCOSE  --    < > 269* 205* 125*  BUN  --    < > 25* 24* 56*  CREATININE  --    < > 1.08* 0.90 2.04*  CALCIUM  --    < > 8.4* 8.8* 8.4*  GFRNONAA  --    < > 58* >60 27*  PROT 6.5   < > 5.7* 5.6* 6.3*  ALBUMIN 3.2*   < > 2.5* 2.7* 3.1*  AST 1,220*   < > 256* 278* 118*  ALT 777*   < > 372* 368* 237*  ALKPHOS  --    < > 609* 553* 445*  BILITOT 2.6*   < > 0.9 1.0 0.6  BILIDIR 1.9*  --   --   --   --   IBILI 0.7  --   --   --   --    < > = values in this interval not displayed.    Iron/TIBC/Ferritin/ %Sat    Component Value Date/Time   IRON 108 08/04/2021 0356   TIBC 347 08/04/2021 0356   FERRITIN 288 08/04/2021 0356   IRONPCTSAT 31 08/04/2021 0356      RADIOGRAPHIC STUDIES: I have personally reviewed the radiological images as listed and agreed with the findings in the report. US Venous Img Lower Bilateral  Result Date: 08/02/2021 CLINICAL DATA:  Shortness of breath EXAM: BILATERAL LOWER EXTREMITY VENOUS DOPPLER ULTRASOUND TECHNIQUE: Gray-scale sonography with  graded compression, as well as color Doppler and duplex ultrasound were performed to evaluate the lower extremity deep venous systems from the level of the common femoral vein and including the common femoral, femoral, profunda femoral, popliteal and calf veins including the posterior tibial, peroneal and gastrocnemius veins when visible. The superficial great saphenous vein was also interrogated. Spectral Doppler was utilized to evaluate flow at rest and with distal augmentation maneuvers in the common femoral, femoral and popliteal veins. COMPARISON:  None. FINDINGS: RIGHT LOWER EXTREMITY Common Femoral Vein: No evidence of thrombus. Normal compressibility, respiratory phasicity and response to augmentation. Saphenofemoral Junction: No evidence of thrombus. Normal compressibility and flow on color Doppler imaging. Profunda Femoral Vein: No evidence of thrombus. Normal compressibility and flow on color Doppler imaging. Femoral Vein: No evidence of thrombus. Normal compressibility, respiratory phasicity and response to augmentation. Popliteal Vein: No evidence of thrombus. Normal compressibility, respiratory phasicity and response to augmentation. Calf Veins:  Prior below-knee amputation. Superficial Great Saphenous Vein: No evidence of thrombus. Normal compressibility. Venous Reflux:  None. Other Findings:  None. LEFT LOWER EXTREMITY Common Femoral Vein: No evidence of thrombus. Normal compressibility, respiratory phasicity and response to augmentation. Saphenofemoral Junction: No evidence of thrombus. Normal compressibility and flow on color Doppler imaging. Profunda Femoral Vein: No evidence of thrombus. Normal compressibility and flow on color Doppler imaging. Femoral Vein: No evidence of thrombus. Normal compressibility, respiratory phasicity and response to augmentation. Popliteal Vein: No evidence of thrombus. Normal compressibility, respiratory phasicity and response to augmentation. Calf Veins: No evidence  of thrombus. Normal compressibility and flow on color Doppler imaging. Superficial Great Saphenous Vein: No evidence of thrombus. Normal compressibility. Venous Reflux:  None. Other Findings:  None. IMPRESSION: No evidence of deep venous thrombosis in either lower extremity. Electronically Signed   By: Maurine Simmering M.D.   On: 08/02/2021 15:37   DG Chest Portable 1 View  Result Date: 08/02/2021 CLINICAL DATA:  A 66 year old female presents for evaluation of shortness of breath. EXAM: PORTABLE CHEST 1 VIEW COMPARISON:  April 26, 2021. FINDINGS: RIGHT IJ Port-A-Cath with tip over the distal superior vena cava. EKG leads project over the chest. LEFT hemidiaphragm remains elevated. Distortion of cardiomediastinal contours related to this hemidiaphragmatic elevation with similar appearance to prior imaging. Heart size and mediastinal contours are normal accounting for this finding. No lobar consolidation. No sign of effusion or pneumothorax. On limited assessment there is no acute skeletal process. IMPRESSION: 1. Chronic elevation of the LEFT hemidiaphragm without acute cardiopulmonary disease. 2. RIGHT IJ Port-A-Cath terminating in the area of the distal superior vena cava. Electronically Signed   By: Zetta Bills M.D.   On: 08/02/2021 14:52   IR IMAGING GUIDED PORT INSERTION  Result Date: 07/27/2021 INDICATION: History of lung cancer EXAM: Ultrasound-guided puncture of the right internal jugular vein Placement of a right-sided chest port using fluoroscopic guidance MEDICATIONS: None ANESTHESIA/SEDATION: Moderate (conscious) sedation was employed during this procedure. A total of Versed 1 mg and Fentanyl 50 mcg was administered intravenously. Moderate Sedation Time: 20 minutes. The patient's level of consciousness and vital signs were monitored continuously by radiology nursing throughout the procedure under my direct supervision. FLUOROSCOPY TIME:  Fluoroscopy Time: 0.3 minutes (2.5 mGy) COMPLICATIONS: None  immediate. PROCEDURE: Informed written consent was obtained from the patient after a thorough discussion of the procedural risks, benefits and alternatives. All questions were addressed. Maximal Sterile Barrier Technique was utilized including caps, mask, sterile gowns, sterile gloves, sterile drape, hand hygiene and skin antiseptic. A timeout was performed prior to the initiation of the procedure. The patient was placed supine on the exam table. The right neck and chest was prepped and draped in the standard sterile fashion. A preliminary ultrasound of the right neck was performed and demonstrates a patent right internal jugular vein. A permanent ultrasound image was stored in the electronic medical record. The overlying skin was anesthetized with 1% Lidocaine. Using ultrasound guidance, access was obtained into the right internal jugular vein using a 21 gauge micropuncture set. A wire was advanced into the SVC, a short incision was made at the puncture site, and serial dilatation performed. Next, in an ipsilateral infraclavicular location, an incision was made at the site of the subcutaneous reservoir. Blunt dissection was used to open a pocket to contain the reservoir. A subcutaneous tunnel was then created from the port site to the puncture site. A(n) 8 Fr single lumen catheter was advanced through the tunnel. The catheter was  attached to the port and this was placed in the subcutaneous pocket. Under fluoroscopic guidance, a peel away sheath was placed, and the catheter was trimmed to the appropriate length and was advanced into the central veins. The catheter length is 22 cm. The tip of the catheter lies near the superior cavoatrial junction. The port flushes and aspirates appropriately. The port was flushed and locked with heparinized saline. The port pocket was closed in 2 layers using 3-0 and 4-0 Vicryl/absorbable suture. Dermabond was also applied to both incisions. The patient tolerated the procedure well  and was transferred to recovery in stable condition. IMPRESSION: Successful placement of a right chest port via the right internal jugular vein. The port is ready for immediate use. Electronically Signed   By: Albin Felling M.D.   On: 07/27/2021 12:30   US Abdomen Limited RUQ (LIVER/GB)  Result Date: 08/03/2021 CLINICAL DATA:  Transaminitis EXAM: ULTRASOUND ABDOMEN LIMITED RIGHT UPPER QUADRANT COMPARISON:  07/28/2021 FINDINGS: Gallbladder: Status post cholecystectomy Common bile duct: Diameter: 3 mm Liver: No focal lesion identified. Within normal limits in parenchymal echogenicity. Portal vein is patent on color Doppler imaging with normal direction of blood flow towards the liver. Other: None. IMPRESSION: Normal sonographic appearance of the liver. Electronically Signed   By: Miachel Roux M.D.   On: 08/03/2021 08:37   US ABDOMEN LIMITED RUQ (LIVER/GB)  Result Date: 07/28/2021 CLINICAL DATA:  Elevated liver enzymes. EXAM: ULTRASOUND ABDOMEN LIMITED RIGHT UPPER QUADRANT COMPARISON:  November 17, 2020. FINDINGS: Gallbladder: Patient is post cholecystectomy as evidenced on prior CT imaging. Common bile duct: Diameter: 5.4 mm Liver: No focal lesion identified. Within normal limits in parenchymal echogenicity. Portal vein is patent on color Doppler imaging with normal direction of blood flow towards the liver. Other: None. IMPRESSION: Post cholecystectomy without signs of biliary duct distension. No parenchymal abnormalities noted in the liver. Electronically Signed   By: Zetta Bills M.D.   On: 07/28/2021 15:13     ASSESSMENT & PLAN:  1. AKI (acute kidney injury) ([place])   2. Non-small cell cancer of left lung ([place])   3. History of immunotherapy   4. Transaminitis   5. Hypotension, unspecified hypotension type   6. Tachycardia   7. Moderate protein-calorie malnutrition ([place])      Cancer Staging  Non-small cell lung cancer [facility]) Staging form: Lung, AJCC 8th Edition - Pathologic stage from 01/28/2021:  Stage IIIA (pT1c, pN2, cM0) - Signed by Earlie Server, MD on 02/17/2021  #Stage IIIA left lung non-small cell lung cancer-  Poorly differentiated adenocarcinoma.  Status post left lower lobectomy and lymph node dissection pT1c pN2 [pathology addendum changed to N2]  She has negative surgical margin, no clear benefit of postoperative RT.  S/p 4 cycles of adjuvant chemotherapy Carboplatin and Taxol.  S/p 1 cycle of Tecentriq. Suffered grade 4 toxicity secondary to immunotherapy. Dr [name] was consulted and recommended. Tecentriq permanently discontinued.   # Immune mediated transaminitis - grade 4 toxicity. S/p hospitalization, steroids/solu-medrol, now on oral prednisone. LFTs have improved and we discussed reducing prednisone to 80 mg daily. She will continue PPI in setting of steroids. Will need to wean off ppi once she comes off of steroids. Statin was held due to abnormal LFTs.   # AKI- secondary to decreased oral intake, nausea. She received IV fluids and kidney function improved to 0.9. Today, worse with creatinine 2.04, BUN 56. Recommend IV fluids today and weekly with labs.   # Hypotension & tachycardia- likely secondary to poor oral  intake. Previously improved with IV fluids. Plan for IV fluids today and weekly as above. I've asked her to hold her blood pressure medication: spironolactone and metoprolol.   # CHF- hx of CHF. LVEF 60-65%. Monitor closely in setting of holding diuretic and frequent IV fluids.   #Depression, she is on Duloxetine. She has major life stress currently I recommend her to see psychiatrist. Patient declined.   Findings discussed with Dr. [name] who contributed to and agreed with plan of care.   All questions were answered. The patient knows to call the clinic with any problems questions or concerns.  cc [name], MD   Return of visit:  Rtc daily for labs (cmp), NP, +/- iv fluids.   Beckey Rutter, DNP, AGNP-C [place] at [facility] [phone]  (clinic) 08/15/2021

## 2021-08-15 NOTE — Progress Notes (Signed)
Bp 79/53 with HR 113. Pt denies dizziness. Pt reports taking prednisone with last dose scheduled for tomorrow.

## 2021-08-16 ENCOUNTER — Encounter: Payer: Self-pay | Admitting: Oncology

## 2021-08-16 ENCOUNTER — Inpatient Hospital Stay (HOSPITAL_BASED_OUTPATIENT_CLINIC_OR_DEPARTMENT_OTHER): Payer: Medicaid Other | Admitting: Nurse Practitioner

## 2021-08-16 ENCOUNTER — Encounter: Payer: Self-pay | Admitting: Nurse Practitioner

## 2021-08-16 ENCOUNTER — Other Ambulatory Visit: Payer: Self-pay

## 2021-08-16 ENCOUNTER — Telehealth: Payer: Self-pay

## 2021-08-16 ENCOUNTER — Inpatient Hospital Stay: Payer: Medicaid Other

## 2021-08-16 VITALS — BP 142/85 | HR 108 | Temp 98.2°F | Resp 17

## 2021-08-16 DIAGNOSIS — C3492 Malignant neoplasm of unspecified part of left bronchus or lung: Secondary | ICD-10-CM

## 2021-08-16 DIAGNOSIS — R7401 Elevation of levels of liver transaminase levels: Secondary | ICD-10-CM | POA: Diagnosis not present

## 2021-08-16 DIAGNOSIS — R000 Tachycardia, unspecified: Secondary | ICD-10-CM

## 2021-08-16 DIAGNOSIS — N179 Acute kidney failure, unspecified: Secondary | ICD-10-CM | POA: Diagnosis not present

## 2021-08-16 DIAGNOSIS — D649 Anemia, unspecified: Secondary | ICD-10-CM | POA: Diagnosis not present

## 2021-08-16 DIAGNOSIS — E440 Moderate protein-calorie malnutrition: Secondary | ICD-10-CM | POA: Diagnosis not present

## 2021-08-16 DIAGNOSIS — I959 Hypotension, unspecified: Secondary | ICD-10-CM | POA: Diagnosis not present

## 2021-08-16 DIAGNOSIS — E2740 Unspecified adrenocortical insufficiency: Secondary | ICD-10-CM | POA: Diagnosis not present

## 2021-08-16 DIAGNOSIS — C3432 Malignant neoplasm of lower lobe, left bronchus or lung: Secondary | ICD-10-CM | POA: Diagnosis not present

## 2021-08-16 LAB — COMPREHENSIVE METABOLIC PANEL
ALT: 229 U/L — ABNORMAL HIGH (ref 0–44)
AST: 129 U/L — ABNORMAL HIGH (ref 15–41)
Albumin: 3.1 g/dL — ABNORMAL LOW (ref 3.5–5.0)
Alkaline Phosphatase: 472 U/L — ABNORMAL HIGH (ref 38–126)
Anion gap: 8 (ref 5–15)
BUN: 50 mg/dL — ABNORMAL HIGH (ref 8–23)
CO2: 29 mmol/L (ref 22–32)
Calcium: 8.6 mg/dL — ABNORMAL LOW (ref 8.9–10.3)
Chloride: 101 mmol/L (ref 98–111)
Creatinine, Ser: 1.59 mg/dL — ABNORMAL HIGH (ref 0.44–1.00)
GFR, Estimated: 36 mL/min — ABNORMAL LOW (ref >60)
Glucose, Bld: 42 mg/dL — CL (ref 70–99)
Potassium: 3.2 mmol/L — ABNORMAL LOW (ref 3.5–5.1)
Sodium: 138 mmol/L (ref 135–145)
Total Bilirubin: 0.8 mg/dL (ref 0.3–1.2)
Total Protein: 6.4 g/dL — ABNORMAL LOW (ref 6.5–8.1)

## 2021-08-16 MED ORDER — POTASSIUM CHLORIDE IN NACL 20-0.9 MEQ/L-% IV SOLN
Freq: Once | INTRAVENOUS | Status: AC
  Filled 2021-08-16: qty 1000

## 2021-08-16 MED ORDER — HEPARIN SOD (PORK) LOCK FLUSH 100 UNIT/ML IV SOLN
500.0000 [IU] | Freq: Once | INTRAVENOUS | Status: AC
  Administered 2021-08-16: 500 [IU] via INTRAVENOUS
  Filled 2021-08-16: qty 5

## 2021-08-16 NOTE — Telephone Encounter (Signed)
MD wants patient to get a cortisol drawn in the am but it has to be drawn before 10 preferably 9, patient aware Lucianne Lei will pick her up by 8a, agrees and verbalizes understanding

## 2021-08-16 NOTE — Progress Notes (Signed)
1 Liter of NS + 20 meq of KCL given over 1 hour via port a cath. Port left accessed fopr return visit tomorrow.

## 2021-08-16 NOTE — Progress Notes (Signed)
Sympt[facility] [place] [facility] [place]. [facility] [address], [place], [place] [address] [phone] (phone) [phone] (fax)  Patient Care Team: [name], MD as PCP - General (Family Medicine) Earlie Server, MD as Consulting Physician (Hematology and Oncology)   Name of the patient: [name] [name]  [id number]  July 06, 1955   Date of visit: 08/16/21  Diagnosis- NSCLC  Chief complaint/ Reason for visit- Hypotension, Tachycardia, AKI, Transaminitis  Heme/Onc history:  Oncology History  Non-small cell lung cancer ([place])  12/30/2020 Initial Diagnosis   Non-small cell lung cancer ([place])   01/28/2021 Cancer Staging   Staging form: Lung, AJCC 8th Edition - Pathologic stage from 01/28/2021: Stage IIIA (pT1c, pN2, cM0) - Signed by Earlie Server, MD on 02/17/2021 Stage prefix: Initial diagnosis Residual tumor (R): R0 - None    02/17/2021 - 02/17/2021 Chemotherapy          02/28/2021 - 05/19/2021 Chemotherapy   Patient is on Treatment Plan : LUNG Carboplatin + Paclitaxel q21d     02/28/2021 - 02/28/2021 Chemotherapy          07/07/2021 -  Chemotherapy   Patient is on Treatment Plan : LUNG NSCLC Atezolizumab q21d       Interval history- Patient is 66 year old female, currently s/p cycle 2 of kadcyla and receiving radiation. She continues prednisone 80 mg daily. Held metoprolol overnight. REports eating and drinking well. Reports compliance with medications.   Review of systems- Review of Systems  Constitutional:  Positive for malaise/fatigue. Negative for chills, fever and weight loss.  HENT:  Negative for hearing loss, nosebleeds, sore throat and tinnitus.   Eyes:  Negative for blurred vision and double vision.  Respiratory:  Negative for cough, hemoptysis, shortness of breath and wheezing.   Cardiovascular:  Negative for chest pain, palpitations and leg swelling.  Gastrointestinal:   Negative for abdominal pain, blood in stool, constipation, diarrhea, melena, nausea and vomiting.  Genitourinary:  Negative for dysuria and urgency.  Musculoskeletal:  Negative for back pain, falls, joint pain and myalgias.  Skin:  Negative for itching and rash.  Neurological:  Negative for dizziness, tingling, sensory change, loss of consciousness, weakness and headaches.  Endo/Heme/Allergies:  Negative for environmental allergies. Does not bruise/bleed easily.  Psychiatric/Behavioral:  Negative for depression. The patient is not nervous/anxious and does not have insomnia.      Allergies  Allergen Reactions   Lisinopril Swelling and Other (See Comments)    Recurrent AKI and hyperkalemia when initiation attempted.    Past Medical History:  Diagnosis Date   Anemia    Cancer ([place])    CHF (congestive heart failure) ([place])    02/16/20 HFrEF 20-25% in setting of acute DVT/PE, underlying CAD   CKD (chronic kidney disease)    Coronary artery disease    02/20/20: CTO pRCA with left-to-right collaterals, 50% mLAD, 80% OM1, medical therapy   Depression    Diabetes ([place])    Diabetes mellitus without complication ([place])    DVT (deep venous thrombosis) ([place]) 02/16/2020   RLE DVT proximal veins 02/16/20 Duplex   Hyperlipidemia    Hypertension    Myocardial infarction [facility]) 2009   Stress related per patient; NSTEMI 2012 100% RCA with left-to-right collaterals   PE (pulmonary thromboembolism) ([place]) 02/15/2020   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, RLE BKA   Peripheral artery disease ([place])     Past Surgical History:  Procedure Laterality Date   IR IMAGING GUIDED PORT INSERTION  07/27/2021   Right lower extremity BKA Right     Social History   Socioeconomic History   Marital status: Married    Spouse name: Not on file   Number of children: Not on file   Years of education: Not on file   Highest education level: Not on file  Occupational History   Not on file  Tobacco Use    Smoking status: Former    Packs/day: 0.25    Types: Cigars, Cigarettes    Quit date: 01/17/2021    Years since quitting: 0.5   Smokeless tobacco: Never  Vaping Use   Vaping Use: Never used  Substance and Sexual Activity   Alcohol use: Never   Drug use: Not Currently   Sexual activity: Not Currently  Other Topics Concern   Not on file  Social History Narrative   Not on file   Social Determinants of Health   Financial Resource Strain: Not on file  Food Insecurity: Not on file  Transportation Needs: Not on file  Physical Activity: Not on file  Stress: Not on file  Social Connections: Not on file  Intimate Partner Violence: Not on file    Family History  Problem Relation Age of Onset   Heart disease Mother    Diabetes Mother    Heart disease Father    Diabetes Father     Current Outpatient Medications:    ACCU-CHEK GUIDE test strip, USE UP TP 4 TIMES A DAY AS DIRECTED, Disp: 100 strip, Rfl: 1   Accu-Chek Softclix Lancets lancets, SMARTSIG:Topical 1-4 Times Daily, Disp: , Rfl:    aspirin EC 81 MG tablet, Take 81 mg by mouth in the morning. Swallow whole., Disp: , Rfl:    blood glucose meter kit and supplies KIT, Dispense based on patient and insurance preference. Use up to four times daily as directed., Disp: 1 each, Rfl: 0   carboxymethylcellulose (REFRESH PLUS) 0.5 % SOLN, Place 1 drop into both eyes in the morning and at bedtime., Disp: , Rfl:    dronabinol (MARINOL) 2.5 MG capsule, Take 1 capsule (2.5 mg total) by mouth 2 (two) times daily before a meal., Disp: 60 capsule, Rfl: 0   DULoxetine (CYMBALTA) 20 MG capsule, TAKE 2 CAPSULES (40 MG TOTAL) BY MOUTH IN THE MORNING AND AT BEDTIME., Disp: 180 capsule, Rfl: 2   gabapentin (NEURONTIN) 100 MG capsule, Take 1 capsule (100 mg total) by mouth 2 (two) times daily., Disp: 60 capsule, Rfl: 5   HUMALOG KWIKPEN 100 UNIT/ML KwikPen, Inject 8 Units into the skin 3 (three) times daily after meals., Disp: , Rfl:    isosorbide  dinitrate (ISORDIL) 30 MG tablet, TAKE 1 TABLET (30 MG TOTAL) BY MOUTH IN THE MORNING, AT NOON, AND AT BEDTIME., Disp: 90 tablet, Rfl: 0   JARDIANCE 10 MG TABS tablet, TAKE 1 TABLET BY MOUTH EVERY DAY, Disp: 90 tablet, Rfl: 0   ketoconazole (NIZORAL) 2 % cream, APPLY 1 APPLICATION TOPICALLY IN THE MORNING AND AT BEDTIME. APPLIED TO FEET, Disp: 60 g, Rfl: 1   LANTUS SOLOSTAR 100 UNIT/ML Solostar Pen, Inject 20 Units into the skin at bedtime., Disp: 15 mL, Rfl: 0   lidocaine-prilocaine (EMLA) cream, Apply 1 application topically as needed. Apply to port and cover with saran wrap 1-2 hours prior to port access, Disp: 30 g, Rfl: 1   metFORMIN (GLUCOPHAGE-XR) 500 MG 24 hr tablet, TAKE 1 TABLET BY MOUTH TWICE A  DAY, Disp: 180 tablet, Rfl: 2   metoprolol succinate (TOPROL-XL) 50 MG 24 hr tablet, Take 1 tablet (50 mg total) by mouth daily. Take with or immediately following a meal., Disp: 90 tablet, Rfl: 1   Multiple Vitamin (MULTIVITAMIN WITH MINERALS) TABS tablet, Take 1 tablet by mouth in the morning. Adults 50+, Disp: , Rfl:    ondansetron (ZOFRAN) 8 MG tablet, Take 1 tablet (8 mg total) by mouth 2 (two) times daily as needed (Nausea or vomiting). Start if needed on the third day after cisplatin., Disp: 30 tablet, Rfl: 1   pantoprazole (PROTONIX) 40 MG tablet, Take 1 tablet (40 mg total) by mouth daily., Disp: 30 tablet, Rfl: 1   predniSONE (DELTASONE) 20 MG tablet, Take 4 tablets (80 mg total) by mouth daily with breakfast., Disp: 32 tablet, Rfl: 0   prochlorperazine (COMPAZINE) 10 MG tablet, Take 1 tablet (10 mg total) by mouth every 6 (six) hours as needed (Nausea or vomiting)., Disp: 30 tablet, Rfl: 1   spironolactone (ALDACTONE) 25 MG tablet, TAKE 1 TABLET (25 MG TOTAL) BY MOUTH DAILY., Disp: 90 tablet, Rfl: 0   diphenhydrAMINE (BENADRYL) 25 MG tablet, Take 25 mg by mouth at bedtime. (Patient not taking: Reported on 08/15/2021), Disp: , Rfl:    docusate sodium (COLACE) 100 MG capsule, Take 1 capsule  (100 mg total) by mouth 2 (two) times daily. (Patient not taking: Reported on 08/15/2021), Disp: 60 capsule, Rfl: 1  Physical exam:  Vitals:   08/16/21 1300  BP: (!) 142/85  Pulse: (!) 108  Resp: 17  Temp: 98.2 F (36.8 C)  TempSrc: Oral  SpO2: 100%   Physical Exam Vitals reviewed.  Constitutional:      Appearance: She is not ill-appearing.  Cardiovascular:     Rate and Rhythm: Regular rhythm. Tachycardia present.  Pulmonary:     Effort: No respiratory distress.     Breath sounds: Normal breath sounds.  Abdominal:     General: There is no distension.     Palpations: Abdomen is soft.  Neurological:     Mental Status: She is alert and oriented to person, place, and time.  Psychiatric:        Mood and Affect: Mood normal.        Behavior: Behavior normal.     CMP Latest Ref Rng & Units 08/15/2021  Glucose 70 - 99 mg/dL 125(H)  BUN 8 - 23 mg/dL 56(H)  Creatinine 0.44 - 1.00 mg/dL 2.04(H)  Sodium 135 - 145 mmol/L 138  Potassium 3.5 - 5.1 mmol/L 3.6  Chloride 98 - 111 mmol/L 101  CO2 22 - 32 mmol/L 25  Calcium 8.9 - 10.3 mg/dL 8.4(L)  Total Protein 6.5 - 8.1 g/dL 6.3(L)  Total Bilirubin 0.3 - 1.2 mg/dL 0.6  Alkaline Phos 38 - 126 U/L 445(H)  AST 15 - 41 U/L 118(H)  ALT 0 - 44 U/L 237(H)   CBC Latest Ref Rng & Units 08/09/2021  WBC 4.0 - 10.5 K/uL 6.2  Hemoglobin 12.0 - 15.0 g/dL 8.3(L)  Hematocrit 36.0 - 46.0 % 26.5(L)  Platelets 150 - 400 K/uL 253    No images are attached to the encounter.  US Venous Img Lower Bilateral  Result Date: 08/02/2021 CLINICAL DATA:  Shortness of breath EXAM: BILATERAL LOWER EXTREMITY VENOUS DOPPLER ULTRASOUND TECHNIQUE: Gray-scale sonography with graded compression, as well as color Doppler and duplex ultrasound were performed to evaluate the lower extremity deep venous systems from the level of the common femoral vein and including  the common femoral, femoral, profunda femoral, popliteal and calf veins including the posterior tibial,  peroneal and gastrocnemius veins when visible. The superficial great saphenous vein was also interrogated. Spectral Doppler was utilized to evaluate flow at rest and with distal augmentation maneuvers in the common femoral, femoral and popliteal veins. COMPARISON:  None. FINDINGS: RIGHT LOWER EXTREMITY Common Femoral Vein: No evidence of thrombus. Normal compressibility, respiratory phasicity and response to augmentation. Saphenofemoral Junction: No evidence of thrombus. Normal compressibility and flow on color Doppler imaging. Profunda Femoral Vein: No evidence of thrombus. Normal compressibility and flow on color Doppler imaging. Femoral Vein: No evidence of thrombus. Normal compressibility, respiratory phasicity and response to augmentation. Popliteal Vein: No evidence of thrombus. Normal compressibility, respiratory phasicity and response to augmentation. Calf Veins: Prior below-knee amputation. Superficial Great Saphenous Vein: No evidence of thrombus. Normal compressibility. Venous Reflux:  None. Other Findings:  None. LEFT LOWER EXTREMITY Common Femoral Vein: No evidence of thrombus. Normal compressibility, respiratory phasicity and response to augmentation. Saphenofemoral Junction: No evidence of thrombus. Normal compressibility and flow on color Doppler imaging. Profunda Femoral Vein: No evidence of thrombus. Normal compressibility and flow on color Doppler imaging. Femoral Vein: No evidence of thrombus. Normal compressibility, respiratory phasicity and response to augmentation. Popliteal Vein: No evidence of thrombus. Normal compressibility, respiratory phasicity and response to augmentation. Calf Veins: No evidence of thrombus. Normal compressibility and flow on color Doppler imaging. Superficial Great Saphenous Vein: No evidence of thrombus. Normal compressibility. Venous Reflux:  None. Other Findings:  None. IMPRESSION: No evidence of deep venous thrombosis in either lower extremity. Electronically  Signed   By: Maurine Simmering M.D.   On: 08/02/2021 15:37   DG Chest Portable 1 View  Result Date: 08/02/2021 CLINICAL DATA:  A 66 year old female presents for evaluation of shortness of breath. EXAM: PORTABLE CHEST 1 VIEW COMPARISON:  April 26, 2021. FINDINGS: RIGHT IJ Port-A-Cath with tip over the distal superior vena cava. EKG leads project over the chest. LEFT hemidiaphragm remains elevated. Distortion of cardiomediastinal contours related to this hemidiaphragmatic elevation with similar appearance to prior imaging. Heart size and mediastinal contours are normal accounting for this finding. No lobar consolidation. No sign of effusion or pneumothorax. On limited assessment there is no acute skeletal process. IMPRESSION: 1. Chronic elevation of the LEFT hemidiaphragm without acute cardiopulmonary disease. 2. RIGHT IJ Port-A-Cath terminating in the area of the distal superior vena cava. Electronically Signed   By: Zetta Bills M.D.   On: 08/02/2021 14:52   IR IMAGING GUIDED PORT INSERTION  Result Date: 07/27/2021 INDICATION: History of lung cancer EXAM: Ultrasound-guided puncture of the right internal jugular vein Placement of a right-sided chest port using fluoroscopic guidance MEDICATIONS: None ANESTHESIA/SEDATION: Moderate (conscious) sedation was employed during this procedure. A total of Versed 1 mg and Fentanyl 50 mcg was administered intravenously. Moderate Sedation Time: 20 minutes. The patient's level of consciousness and vital signs were monitored continuously by radiology nursing throughout the procedure under my direct supervision. FLUOROSCOPY TIME:  Fluoroscopy Time: 0.3 minutes (2.5 mGy) COMPLICATIONS: None immediate. PROCEDURE: Informed written consent was obtained from the patient after a thorough discussion of the procedural risks, benefits and alternatives. All questions were addressed. Maximal Sterile Barrier Technique was utilized including caps, mask, sterile gowns, sterile gloves,  sterile drape, hand hygiene and skin antiseptic. A timeout was performed prior to the initiation of the procedure. The patient was placed supine on the exam table. The right neck and chest was prepped and draped in the standard sterile  fashion. A preliminary ultrasound of the right neck was performed and demonstrates a patent right internal jugular vein. A permanent ultrasound image was stored in the electronic medical record. The overlying skin was anesthetized with 1% Lidocaine. Using ultrasound guidance, access was obtained into the right internal jugular vein using a 21 gauge micropuncture set. A wire was advanced into the SVC, a short incision was made at the puncture site, and serial dilatation performed. Next, in an ipsilateral infraclavicular location, an incision was made at the site of the subcutaneous reservoir. Blunt dissection was used to open a pocket to contain the reservoir. A subcutaneous tunnel was then created from the port site to the puncture site. A(n) 8 Fr single lumen catheter was advanced through the tunnel. The catheter was attached to the port and this was placed in the subcutaneous pocket. Under fluoroscopic guidance, a peel away sheath was placed, and the catheter was trimmed to the appropriate length and was advanced into the central veins. The catheter length is 22 cm. The tip of the catheter lies near the superior cavoatrial junction. The port flushes and aspirates appropriately. The port was flushed and locked with heparinized saline. The port pocket was closed in 2 layers using 3-0 and 4-0 Vicryl/absorbable suture. Dermabond was also applied to both incisions. The patient tolerated the procedure well and was transferred to recovery in stable condition. IMPRESSION: Successful placement of a right chest port via the right internal jugular vein. The port is ready for immediate use. Electronically Signed   By: Albin Felling M.D.   On: 07/27/2021 12:30   US Abdomen Limited RUQ  (LIVER/GB)  Result Date: 08/03/2021 CLINICAL DATA:  Transaminitis EXAM: ULTRASOUND ABDOMEN LIMITED RIGHT UPPER QUADRANT COMPARISON:  07/28/2021 FINDINGS: Gallbladder: Status post cholecystectomy Common bile duct: Diameter: 3 mm Liver: No focal lesion identified. Within normal limits in parenchymal echogenicity. Portal vein is patent on color Doppler imaging with normal direction of blood flow towards the liver. Other: None. IMPRESSION: Normal sonographic appearance of the liver. Electronically Signed   By: Miachel Roux M.D.   On: 08/03/2021 08:37   US ABDOMEN LIMITED RUQ (LIVER/GB)  Result Date: 07/28/2021 CLINICAL DATA:  Elevated liver enzymes. EXAM: ULTRASOUND ABDOMEN LIMITED RIGHT UPPER QUADRANT COMPARISON:  November 17, 2020. FINDINGS: Gallbladder: Patient is post cholecystectomy as evidenced on prior CT imaging. Common bile duct: Diameter: 5.4 mm Liver: No focal lesion identified. Within normal limits in parenchymal echogenicity. Portal vein is patent on color Doppler imaging with normal direction of blood flow towards the liver. Other: None. IMPRESSION: Post cholecystectomy without signs of biliary duct distension. No parenchymal abnormalities noted in the liver. Electronically Signed   By: Zetta Bills M.D.   On: 07/28/2021 15:13    Assessment and plan- Patient is a 66 y.o. female   # Stage IIIA Left Lung Cancer- NSCL- most recently tecentriq. Discontinued permanently d/t transaminitis.   # Immune mediated transaminitis- grade 4 toxicity. S/p hospitalization, steroids/solu medrol. Now on oral prednisone 80 mg daily. Tolerating well. LFTs stable, slowly improving. If they continue to improve, will decrease to 60 mg daily.   # AKI- IV fluids today.   # Adrenal Insufficiency- Weight loss, lethargy, worsening blood sugars- will check AM cortisol to evaluate for adrenal insufficiency. Cortisol 2.8. Consistent with adrenal insufficiency. On prednisone 80 mg for above. Discussed with Dr. [name] who  recommends urgent referral to endocrinology for assistance in managing complex case. May experience worsening AI symptoms as we wean prednisone for immune transaminitis.  Reviewed symptoms of adrenal crisis including lightheadedness, dizziness, weakness, sweating, nausea/vomiting/abdominal pain. Recommend ER if this occurs.   # elevated blood sugars with episodes of hypoglycemia- questioning adrenal insufficiency. Recommend close monitoring of blood sugars at home. Appreciate endocrinology input.   # Hypotension- holding metoprolol. IV fluids. Cortisol as above.   # Anemia- hemoglobin 8.3. Plan to check cbc tomorrow, possible blood Thursday. Check ferritin and iron studies as well. Addendum: iron studies normal. Plan for transfusion for hemoglobin < 7.   Disposition: Refer to endocrinology at [facility]. Patient requires transportation.  Continue daily lab (cbc & CMP) checks. Plan to check AM cortisol tomorrow. +/- daily fluids.  She will follow up with Dr. [name] as scheduled on 3/6 or sooner if clinical decline.    Visit Diagnosis No diagnosis found.  Patient expressed understanding and was in agreement with this plan. She also understands that She can call clinic at any time with any questions, concerns, or complaints.   Thank you for allowing me to participate in the care of this very pleasant patient.   Beckey Rutter, DNP, AGNP-C [place] [facility] [place]

## 2021-08-17 ENCOUNTER — Inpatient Hospital Stay: Payer: Medicaid Other | Admitting: Hospice and Palliative Medicine

## 2021-08-17 ENCOUNTER — Inpatient Hospital Stay: Payer: Medicaid Other | Attending: Oncology

## 2021-08-17 ENCOUNTER — Other Ambulatory Visit: Payer: Self-pay | Admitting: *Deleted

## 2021-08-17 ENCOUNTER — Inpatient Hospital Stay: Payer: Medicaid Other

## 2021-08-17 ENCOUNTER — Other Ambulatory Visit: Payer: Self-pay

## 2021-08-17 VITALS — BP 164/94 | HR 104 | Temp 97.8°F | Resp 17

## 2021-08-17 DIAGNOSIS — D649 Anemia, unspecified: Secondary | ICD-10-CM | POA: Diagnosis not present

## 2021-08-17 DIAGNOSIS — I252 Old myocardial infarction: Secondary | ICD-10-CM | POA: Diagnosis not present

## 2021-08-17 DIAGNOSIS — I739 Peripheral vascular disease, unspecified: Secondary | ICD-10-CM | POA: Diagnosis not present

## 2021-08-17 DIAGNOSIS — Z888 Allergy status to other drugs, medicaments and biological substances status: Secondary | ICD-10-CM | POA: Insufficient documentation

## 2021-08-17 DIAGNOSIS — E785 Hyperlipidemia, unspecified: Secondary | ICD-10-CM | POA: Diagnosis not present

## 2021-08-17 DIAGNOSIS — R3129 Other microscopic hematuria: Secondary | ICD-10-CM | POA: Insufficient documentation

## 2021-08-17 DIAGNOSIS — Z8249 Family history of ischemic heart disease and other diseases of the circulatory system: Secondary | ICD-10-CM | POA: Insufficient documentation

## 2021-08-17 DIAGNOSIS — N1832 Chronic kidney disease, stage 3b: Secondary | ICD-10-CM | POA: Insufficient documentation

## 2021-08-17 DIAGNOSIS — N179 Acute kidney failure, unspecified: Secondary | ICD-10-CM | POA: Diagnosis not present

## 2021-08-17 DIAGNOSIS — Z833 Family history of diabetes mellitus: Secondary | ICD-10-CM | POA: Diagnosis not present

## 2021-08-17 DIAGNOSIS — E440 Moderate protein-calorie malnutrition: Secondary | ICD-10-CM | POA: Diagnosis not present

## 2021-08-17 DIAGNOSIS — R7401 Elevation of levels of liver transaminase levels: Secondary | ICD-10-CM | POA: Insufficient documentation

## 2021-08-17 DIAGNOSIS — Z86718 Personal history of other venous thrombosis and embolism: Secondary | ICD-10-CM | POA: Insufficient documentation

## 2021-08-17 DIAGNOSIS — N281 Cyst of kidney, acquired: Secondary | ICD-10-CM | POA: Diagnosis not present

## 2021-08-17 DIAGNOSIS — N3289 Other specified disorders of bladder: Secondary | ICD-10-CM | POA: Diagnosis not present

## 2021-08-17 DIAGNOSIS — R5383 Other fatigue: Secondary | ICD-10-CM | POA: Insufficient documentation

## 2021-08-17 DIAGNOSIS — C3492 Malignant neoplasm of unspecified part of left bronchus or lung: Secondary | ICD-10-CM

## 2021-08-17 DIAGNOSIS — Z79899 Other long term (current) drug therapy: Secondary | ICD-10-CM | POA: Diagnosis not present

## 2021-08-17 DIAGNOSIS — R634 Abnormal weight loss: Secondary | ICD-10-CM | POA: Insufficient documentation

## 2021-08-17 DIAGNOSIS — Z9049 Acquired absence of other specified parts of digestive tract: Secondary | ICD-10-CM | POA: Insufficient documentation

## 2021-08-17 DIAGNOSIS — I129 Hypertensive chronic kidney disease with stage 1 through stage 4 chronic kidney disease, or unspecified chronic kidney disease: Secondary | ICD-10-CM | POA: Diagnosis not present

## 2021-08-17 DIAGNOSIS — R0602 Shortness of breath: Secondary | ICD-10-CM | POA: Insufficient documentation

## 2021-08-17 DIAGNOSIS — Z87891 Personal history of nicotine dependence: Secondary | ICD-10-CM | POA: Insufficient documentation

## 2021-08-17 DIAGNOSIS — E1122 Type 2 diabetes mellitus with diabetic chronic kidney disease: Secondary | ICD-10-CM | POA: Diagnosis not present

## 2021-08-17 DIAGNOSIS — C3432 Malignant neoplasm of lower lobe, left bronchus or lung: Secondary | ICD-10-CM | POA: Insufficient documentation

## 2021-08-17 DIAGNOSIS — Z5111 Encounter for antineoplastic chemotherapy: Secondary | ICD-10-CM | POA: Diagnosis present

## 2021-08-17 DIAGNOSIS — Z86711 Personal history of pulmonary embolism: Secondary | ICD-10-CM | POA: Insufficient documentation

## 2021-08-17 DIAGNOSIS — Z89511 Acquired absence of right leg below knee: Secondary | ICD-10-CM | POA: Diagnosis not present

## 2021-08-17 DIAGNOSIS — F32A Depression, unspecified: Secondary | ICD-10-CM | POA: Diagnosis not present

## 2021-08-17 DIAGNOSIS — Z87440 Personal history of urinary (tract) infections: Secondary | ICD-10-CM | POA: Diagnosis not present

## 2021-08-17 DIAGNOSIS — D539 Nutritional anemia, unspecified: Secondary | ICD-10-CM

## 2021-08-17 DIAGNOSIS — Z902 Acquired absence of lung [part of]: Secondary | ICD-10-CM | POA: Insufficient documentation

## 2021-08-17 DIAGNOSIS — Z7952 Long term (current) use of systemic steroids: Secondary | ICD-10-CM | POA: Insufficient documentation

## 2021-08-17 LAB — COMPREHENSIVE METABOLIC PANEL
ALT: 188 U/L — ABNORMAL HIGH (ref 0–44)
AST: 112 U/L — ABNORMAL HIGH (ref 15–41)
Albumin: 2.8 g/dL — ABNORMAL LOW (ref 3.5–5.0)
Alkaline Phosphatase: 406 U/L — ABNORMAL HIGH (ref 38–126)
Anion gap: 9 (ref 5–15)
BUN: 47 mg/dL — ABNORMAL HIGH (ref 8–23)
CO2: 25 mmol/L (ref 22–32)
Calcium: 8.2 mg/dL — ABNORMAL LOW (ref 8.9–10.3)
Chloride: 107 mmol/L (ref 98–111)
Creatinine, Ser: 1.66 mg/dL — ABNORMAL HIGH (ref 0.44–1.00)
GFR, Estimated: 34 mL/min — ABNORMAL LOW (ref >60)
Glucose, Bld: 57 mg/dL — ABNORMAL LOW (ref 70–99)
Potassium: 3.4 mmol/L — ABNORMAL LOW (ref 3.5–5.1)
Sodium: 141 mmol/L (ref 135–145)
Total Bilirubin: 0.5 mg/dL (ref 0.3–1.2)
Total Protein: 5.9 g/dL — ABNORMAL LOW (ref 6.5–8.1)

## 2021-08-17 LAB — CBC WITH DIFFERENTIAL/PLATELET
Abs Immature Granulocytes: 0.02 10*3/uL (ref 0.00–0.07)
Basophils Absolute: 0 10*3/uL (ref 0.0–0.1)
Basophils Relative: 0 %
Eosinophils Absolute: 0 10*3/uL (ref 0.0–0.5)
Eosinophils Relative: 0 %
HCT: 25.1 % — ABNORMAL LOW (ref 36.0–46.0)
Hemoglobin: 8 g/dL — ABNORMAL LOW (ref 12.0–15.0)
Immature Granulocytes: 0 %
Lymphocytes Relative: 15 %
Lymphs Abs: 0.9 10*3/uL (ref 0.7–4.0)
MCH: 31.3 pg (ref 26.0–34.0)
MCHC: 31.9 g/dL (ref 30.0–36.0)
MCV: 98 fL (ref 80.0–100.0)
Monocytes Absolute: 0.5 10*3/uL (ref 0.1–1.0)
Monocytes Relative: 9 %
Neutro Abs: 4.5 10*3/uL (ref 1.7–7.7)
Neutrophils Relative %: 76 %
Platelets: 278 10*3/uL (ref 150–400)
RBC: 2.56 MIL/uL — ABNORMAL LOW (ref 3.87–5.11)
RDW: 16.1 % — ABNORMAL HIGH (ref 11.5–15.5)
WBC: 5.9 10*3/uL (ref 4.0–10.5)
nRBC: 0 % (ref 0.0–0.2)

## 2021-08-17 LAB — CORTISOL: Cortisol, Plasma: 2.8 ug/dL

## 2021-08-17 LAB — TSH: TSH: 2.055 u[IU]/mL (ref 0.350–4.500)

## 2021-08-17 LAB — T4, FREE: Free T4: 1.05 ng/dL (ref 0.61–1.12)

## 2021-08-17 MED ORDER — POTASSIUM CHLORIDE 20 MEQ/100ML IV SOLN
20.0000 meq | Freq: Once | INTRAVENOUS | Status: AC
  Administered 2021-08-17: 20 meq via INTRAVENOUS

## 2021-08-17 MED ORDER — HEPARIN SOD (PORK) LOCK FLUSH 100 UNIT/ML IV SOLN
500.0000 [IU] | Freq: Once | INTRAVENOUS | Status: AC
  Administered 2021-08-17: 500 [IU] via INTRAVENOUS
  Filled 2021-08-17: qty 5

## 2021-08-17 MED ORDER — SODIUM CHLORIDE 0.9% FLUSH
10.0000 mL | Freq: Once | INTRAVENOUS | Status: AC
  Administered 2021-08-17: 10 mL via INTRAVENOUS
  Filled 2021-08-17: qty 10

## 2021-08-17 MED ORDER — SODIUM CHLORIDE 0.9 % IV SOLN
Freq: Once | INTRAVENOUS | Status: AC
  Filled 2021-08-17: qty 250

## 2021-08-17 NOTE — Patient Instructions (Signed)
Potassium Acetate Injection ?What is this medication? ?POTASSIUM ACETATE (poe TASS i um ASa tate) prevents and treats low levels of potassium in your body. Potassium plays an important role in maintaining the health of your kidneys, heart, muscles, and nervous system. ?This medicine may be used for other purposes; ask your health care provider or pharmacist if you have questions. ?What should I tell my care team before I take this medication? ?They need to know if you have any of these conditions: ?Addison's disease ?Dehydration ?Diabetes ?Heart disease ?High levels of potassium in the blood ?Irregular heartbeat ?Kidney disease ?Liver disease ?Recent severe burn ?An unusual or allergic reaction to potassium, other medications, foods, dyes, or preservatives ?Pregnant or trying to get pregnant ?Breast-feeding ?How should I use this medication? ?This medication is for infusion into a vein. It is given in a hospital or clinic setting. ?Talk to your care team about the use of this medication in children. Special care may be needed. ?Overdosage: If you think you have taken too much of this medicine contact a poison control center or emergency room at once. ?NOTE: This medicine is only for you. Do not share this medicine with others. ?What if I miss a dose? ?This does not apply. ?What may interact with this medication? ?Do not take this medication with any of the following: ?Certain diuretics such as spironolactone, triamterene ?Eplerenone ?Sodium polystyrene sulfonate ?This medication may also interact with the following: ?Certain medications for blood pressure or heart disease like lisinopril, losartan, quinapril, valsartan ?Medications that lower your chance of fighting infection such as cyclosporine, tacrolimus ?NSAIDs, medications for pain and inflammation, like ibuprofen or naproxen ?Other potassium supplements ?Salt substitutes ?This list may not describe all possible interactions. Give your health care provider a  list of all the medicines, herbs, non-prescription drugs, or dietary supplements you use. Also tell them if you smoke, drink alcohol, or use illegal drugs. Some items may interact with your medicine. ?What should I watch for while using this medication? ?Your condition will be monitored carefully while you are receiving this medication. ?You may need blood work done while you are taking this medication. ?What side effects may I notice from receiving this medication? ?Side effects that you should report to your care team as soon as possible: ?Allergic reactions--skin rash, itching, hives, swelling of the face, lips, tongue, or throat ?High potassium level--muscle weakness, fast or irregular heartbeat ?Side effects that usually do not require medical attention (report these to your care team if they continue or are bothersome): ?Pain, redness, or irritation at injection site ?This list may not describe all possible side effects. Call your doctor for medical advice about side effects. You may report side effects to FDA at 1-800-FDA-1088. ?Where should I keep my medication? ?This medication is given in a hospital or clinic and will not be stored at home. ?NOTE: This sheet is a summary. It may not cover all possible information. If you have questions about this medicine, talk to your doctor, pharmacist, or health care provider. ?? 2022 Elsevier/Gold Standard (2021-02-10 00:00:00) ? ?

## 2021-08-17 NOTE — Progress Notes (Signed)
Patient tolerated 1 L NS infusion well today in fluid clinic, no concerns voiced. K 3.4, NP notified of labs. 20 MeQ K ordered and given. Decline in hgb. NP ordered hold tube and possible blood Thurs & Fir. Patient aware and verbalizes understand of appointments. Port left accessed for return of visit tomorrow. Discharged, stable.  ?

## 2021-08-18 ENCOUNTER — Inpatient Hospital Stay: Payer: Medicaid Other

## 2021-08-18 ENCOUNTER — Other Ambulatory Visit: Payer: Self-pay | Admitting: Nurse Practitioner

## 2021-08-18 ENCOUNTER — Inpatient Hospital Stay: Payer: Medicaid Other | Admitting: Hospice and Palliative Medicine

## 2021-08-18 VITALS — BP 145/84 | HR 98 | Temp 97.0°F | Resp 19

## 2021-08-18 DIAGNOSIS — Z95828 Presence of other vascular implants and grafts: Secondary | ICD-10-CM

## 2021-08-18 DIAGNOSIS — D539 Nutritional anemia, unspecified: Secondary | ICD-10-CM

## 2021-08-18 DIAGNOSIS — C3492 Malignant neoplasm of unspecified part of left bronchus or lung: Secondary | ICD-10-CM

## 2021-08-18 DIAGNOSIS — C3432 Malignant neoplasm of lower lobe, left bronchus or lung: Secondary | ICD-10-CM | POA: Diagnosis not present

## 2021-08-18 LAB — COMPREHENSIVE METABOLIC PANEL
ALT: 181 U/L — ABNORMAL HIGH (ref 0–44)
AST: 103 U/L — ABNORMAL HIGH (ref 15–41)
Albumin: 2.9 g/dL — ABNORMAL LOW (ref 3.5–5.0)
Alkaline Phosphatase: 410 U/L — ABNORMAL HIGH (ref 38–126)
Anion gap: 9 (ref 5–15)
BUN: 40 mg/dL — ABNORMAL HIGH (ref 8–23)
CO2: 21 mmol/L — ABNORMAL LOW (ref 22–32)
Calcium: 8.1 mg/dL — ABNORMAL LOW (ref 8.9–10.3)
Chloride: 108 mmol/L (ref 98–111)
Creatinine, Ser: 1.64 mg/dL — ABNORMAL HIGH (ref 0.44–1.00)
GFR, Estimated: 35 mL/min — ABNORMAL LOW (ref >60)
Glucose, Bld: 344 mg/dL — ABNORMAL HIGH (ref 70–99)
Potassium: 4.1 mmol/L (ref 3.5–5.1)
Sodium: 138 mmol/L (ref 135–145)
Total Bilirubin: 0.6 mg/dL (ref 0.3–1.2)
Total Protein: 5.9 g/dL — ABNORMAL LOW (ref 6.5–8.1)

## 2021-08-18 LAB — IRON AND TIBC
Iron: 74 ug/dL (ref 28–170)
Saturation Ratios: 19 % (ref 10.4–31.8)
TIBC: 400 ug/dL (ref 250–450)
UIBC: 326 ug/dL

## 2021-08-18 LAB — CBC WITH DIFFERENTIAL/PLATELET
Abs Immature Granulocytes: 0.03 10*3/uL (ref 0.00–0.07)
Basophils Absolute: 0 10*3/uL (ref 0.0–0.1)
Basophils Relative: 0 %
Eosinophils Absolute: 0 10*3/uL (ref 0.0–0.5)
Eosinophils Relative: 0 %
HCT: 25.9 % — ABNORMAL LOW (ref 36.0–46.0)
Hemoglobin: 8.2 g/dL — ABNORMAL LOW (ref 12.0–15.0)
Immature Granulocytes: 1 %
Lymphocytes Relative: 4 %
Lymphs Abs: 0.2 10*3/uL — ABNORMAL LOW (ref 0.7–4.0)
MCH: 31.4 pg (ref 26.0–34.0)
MCHC: 31.7 g/dL (ref 30.0–36.0)
MCV: 99.2 fL (ref 80.0–100.0)
Monocytes Absolute: 0.1 10*3/uL (ref 0.1–1.0)
Monocytes Relative: 2 %
Neutro Abs: 5.7 10*3/uL (ref 1.7–7.7)
Neutrophils Relative %: 93 %
Platelets: 291 10*3/uL (ref 150–400)
RBC: 2.61 MIL/uL — ABNORMAL LOW (ref 3.87–5.11)
RDW: 15.9 % — ABNORMAL HIGH (ref 11.5–15.5)
WBC: 6 10*3/uL (ref 4.0–10.5)
nRBC: 0 % (ref 0.0–0.2)

## 2021-08-18 LAB — SAMPLE TO BLOOD BANK

## 2021-08-18 LAB — FERRITIN: Ferritin: 61 ng/mL (ref 11–307)

## 2021-08-18 MED ORDER — SODIUM CHLORIDE 0.9% FLUSH
10.0000 mL | INTRAVENOUS | Status: DC | PRN
  Administered 2021-08-18: 10 mL via INTRAVENOUS
  Filled 2021-08-18: qty 10

## 2021-08-18 MED ORDER — HEPARIN SOD (PORK) LOCK FLUSH 100 UNIT/ML IV SOLN
500.0000 [IU] | Freq: Once | INTRAVENOUS | Status: AC
  Administered 2021-08-18: 500 [IU] via INTRAVENOUS
  Filled 2021-08-18: qty 5

## 2021-08-18 MED ORDER — HEPARIN SOD (PORK) LOCK FLUSH 100 UNIT/ML IV SOLN
INTRAVENOUS | Status: AC
  Filled 2021-08-18: qty 5

## 2021-08-18 MED ORDER — HEPARIN SOD (PORK) LOCK FLUSH 100 UNIT/ML IV SOLN
500.0000 [IU] | Freq: Once | INTRAVENOUS | Status: DC
  Filled 2021-08-18: qty 5

## 2021-08-18 MED ORDER — SODIUM CHLORIDE 0.9 % IV SOLN
Freq: Once | INTRAVENOUS | Status: AC
  Filled 2021-08-18: qty 250

## 2021-08-18 NOTE — Progress Notes (Signed)
Labs reviewed with Ander Purpura NP. Per NP pt to only receive 1L of NS today. Pt scheduled tomorrow 08/19/2021 for labs and possible blood/fluids. Pt updated and agrees to plan. VSS. Pt stable at discharge.  ? ?[name] [name]  ?

## 2021-08-18 NOTE — Progress Notes (Signed)
Nutrition Follow-up: ? ?Patient with stage II lung cancer. Noted recent hospitalization for elevated LFTs, AKI.   ? ?Met with patient during infusion.  Reports that she is eating "everything in sight." Says that she ate oatmeal and french fries this am.  Yesterday ate chicken biscuit, oatmeal and steak with fries and salad.  Has been drinking glucerna shakes, along with water and milk.  Denies any nutrition impact symptoms.   ? ? ?Medications: reviewed ? ?Labs: glucose 344 (says she did not take her insulin) ? ?Anthropometrics:  ? ?Weight 116 lb 11.2 oz today ? ?112 lb on 2/9 ?118 lb 4.8 oz on 1/19 ?114 lb on 12/01 ?115 lb on 11/21 ?113 lb on 10/25 ?116 lb on 10/4 ? ? ?NUTRITION DIAGNOSIS: Inadequate oral intake improving ? ? ? ?INTERVENTION:  ?Stressed importance of taking diabetes medication to keep blood glucose under control ?Encouraged patient to continue glucerna shakes ?Continue high calorie, high protein foods to promote weight gain ? ?  ? ?MONITORING, EVALUATION, GOAL: weight trends, intake ? ? ?NEXT VISIT: to be determined ? ?Khara Renaud B. Zenia Resides, RD, LDN ?Registered Dietitian ?[phone] (mobile) ? ? ?

## 2021-08-19 ENCOUNTER — Other Ambulatory Visit: Payer: Self-pay | Admitting: Nurse Practitioner

## 2021-08-19 ENCOUNTER — Encounter: Payer: Self-pay | Admitting: Oncology

## 2021-08-19 ENCOUNTER — Inpatient Hospital Stay: Payer: Medicaid Other

## 2021-08-19 ENCOUNTER — Other Ambulatory Visit: Payer: Self-pay | Admitting: Emergency Medicine

## 2021-08-19 ENCOUNTER — Other Ambulatory Visit: Payer: Self-pay | Admitting: Family Medicine

## 2021-08-19 ENCOUNTER — Inpatient Hospital Stay: Payer: Medicaid Other | Admitting: Hospice and Palliative Medicine

## 2021-08-19 ENCOUNTER — Other Ambulatory Visit: Payer: Self-pay

## 2021-08-19 VITALS — BP 130/81 | HR 107 | Temp 97.8°F

## 2021-08-19 DIAGNOSIS — C3492 Malignant neoplasm of unspecified part of left bronchus or lung: Secondary | ICD-10-CM

## 2021-08-19 DIAGNOSIS — I25118 Atherosclerotic heart disease of native coronary artery with other forms of angina pectoris: Secondary | ICD-10-CM

## 2021-08-19 DIAGNOSIS — C3432 Malignant neoplasm of lower lobe, left bronchus or lung: Secondary | ICD-10-CM | POA: Diagnosis not present

## 2021-08-19 DIAGNOSIS — E2740 Unspecified adrenocortical insufficiency: Secondary | ICD-10-CM

## 2021-08-19 DIAGNOSIS — Z95828 Presence of other vascular implants and grafts: Secondary | ICD-10-CM

## 2021-08-19 LAB — COMPREHENSIVE METABOLIC PANEL
ALT: 148 U/L — ABNORMAL HIGH (ref 0–44)
AST: 85 U/L — ABNORMAL HIGH (ref 15–41)
Albumin: 2.7 g/dL — ABNORMAL LOW (ref 3.5–5.0)
Alkaline Phosphatase: 369 U/L — ABNORMAL HIGH (ref 38–126)
Anion gap: 8 (ref 5–15)
BUN: 35 mg/dL — ABNORMAL HIGH (ref 8–23)
CO2: 22 mmol/L (ref 22–32)
Calcium: 8.4 mg/dL — ABNORMAL LOW (ref 8.9–10.3)
Chloride: 108 mmol/L (ref 98–111)
Creatinine, Ser: 1.64 mg/dL — ABNORMAL HIGH (ref 0.44–1.00)
GFR, Estimated: 35 mL/min — ABNORMAL LOW (ref >60)
Glucose, Bld: 315 mg/dL — ABNORMAL HIGH (ref 70–99)
Potassium: 4.3 mmol/L (ref 3.5–5.1)
Sodium: 138 mmol/L (ref 135–145)
Total Bilirubin: 0.3 mg/dL (ref 0.3–1.2)
Total Protein: 5.8 g/dL — ABNORMAL LOW (ref 6.5–8.1)

## 2021-08-19 LAB — CBC WITH DIFFERENTIAL/PLATELET
Abs Immature Granulocytes: 0.02 10*3/uL (ref 0.00–0.07)
Basophils Absolute: 0 10*3/uL (ref 0.0–0.1)
Basophils Relative: 0 %
Eosinophils Absolute: 0 10*3/uL (ref 0.0–0.5)
Eosinophils Relative: 0 %
HCT: 25.7 % — ABNORMAL LOW (ref 36.0–46.0)
Hemoglobin: 8.2 g/dL — ABNORMAL LOW (ref 12.0–15.0)
Immature Granulocytes: 0 %
Lymphocytes Relative: 5 %
Lymphs Abs: 0.3 10*3/uL — ABNORMAL LOW (ref 0.7–4.0)
MCH: 31.8 pg (ref 26.0–34.0)
MCHC: 31.9 g/dL (ref 30.0–36.0)
MCV: 99.6 fL (ref 80.0–100.0)
Monocytes Absolute: 0.2 10*3/uL (ref 0.1–1.0)
Monocytes Relative: 3 %
Neutro Abs: 5.5 10*3/uL (ref 1.7–7.7)
Neutrophils Relative %: 92 %
Platelets: 282 10*3/uL (ref 150–400)
RBC: 2.58 MIL/uL — ABNORMAL LOW (ref 3.87–5.11)
RDW: 15.8 % — ABNORMAL HIGH (ref 11.5–15.5)
WBC: 5.9 10*3/uL (ref 4.0–10.5)
nRBC: 0 % (ref 0.0–0.2)

## 2021-08-19 MED ORDER — PREDNISONE 20 MG PO TABS: 60.0000 mg | ORAL_TABLET | Freq: Every day | ORAL | 0 refills | Status: AC

## 2021-08-19 MED ORDER — FAMOTIDINE IN NACL 20-0.9 MG/50ML-% IV SOLN
20.0000 mg | Freq: Once | INTRAVENOUS | Status: AC
  Administered 2021-08-19: 20 mg via INTRAVENOUS
  Filled 2021-08-19: qty 50

## 2021-08-19 MED ORDER — HEPARIN SOD (PORK) LOCK FLUSH 100 UNIT/ML IV SOLN
500.0000 [IU] | Freq: Once | INTRAVENOUS | Status: AC
  Administered 2021-08-19: 500 [IU]
  Filled 2021-08-19: qty 5

## 2021-08-19 MED ORDER — HEPARIN SOD (PORK) LOCK FLUSH 100 UNIT/ML IV SOLN
INTRAVENOUS | Status: AC
  Filled 2021-08-19: qty 5

## 2021-08-19 MED ORDER — SODIUM CHLORIDE 0.9 % IV SOLN
Freq: Once | INTRAVENOUS | Status: AC
  Filled 2021-08-19: qty 250

## 2021-08-19 NOTE — Telephone Encounter (Signed)
Requested Prescriptions  ?Pending Prescriptions Disp Refills  ?? isosorbide dinitrate (ISORDIL) 30 MG tablet [Pharmacy Med Name: ISOSORBIDE DINITRATE 30 MG TAB] 90 tablet 0  ?  Sig: TAKE 1 TABLET (30 MG TOTAL) BY MOUTH IN THE MORNING, AT NOON, AND AT BEDTIME.  ?  ? Cardiovascular:  Nitrates Passed - 08/19/2021  4:01 PM  ?  ?  Passed - Last BP in normal range  ?  BP Readings from Last 1 Encounters:  ?08/19/21 130/81  ?   ?  ?  Passed - Last Heart Rate in normal range  ?  Pulse Readings from Last 1 Encounters:  ?08/19/21 (!) 107  ?   ?  ?  Passed - Valid encounter within last 12 months  ?  Recent Outpatient Visits   ?      ? 2 months ago Primary hypertension  ? [facility] [name], MD  ? 6 months ago Chronic pain due to neoplasm  ? [facility] [name], MD  ? 8 months ago Type 2 diabetes mellitus with diabetic peripheral angiopathy without gangrene, with long-term current use of insulin ([place])  ? [facility] [name], MD  ? 1 year ago Type 2 diabetes mellitus with diabetic peripheral angiopathy without gangrene, with long-term current use of insulin ([place])  ? [facility] [name], MD  ? 1 year ago Encounter for medical examination to establish care  ? [facility] [name], MD  ?  ?  ?Future Appointments   ?        ? In 3 days [name], MD [facility], [place]  ? In 10 months [name], MD [facility], [place]  ?  ? ?  ?  ?  ? ?

## 2021-08-19 NOTE — Progress Notes (Signed)
Per Beckey Rutter NP, IVF today- no blood . Pt c/o heartburn . Pepcid 20 mg IV per NP . BS 315 . Pt states taking insulin . Per NP- she will decrease prednisone from 80 mg to 60 mg.  Pt aware and verbalized understanding.  Advised per NP to monitor BS tid and to hold insulin if experiences hypoglycemia . Advise to push fluids and stay hydrated. Pt verbalized understanding.  ?

## 2021-08-22 ENCOUNTER — Other Ambulatory Visit: Payer: Self-pay

## 2021-08-22 ENCOUNTER — Inpatient Hospital Stay: Payer: Medicaid Other

## 2021-08-22 ENCOUNTER — Encounter: Payer: Self-pay | Admitting: Oncology

## 2021-08-22 ENCOUNTER — Inpatient Hospital Stay (HOSPITAL_BASED_OUTPATIENT_CLINIC_OR_DEPARTMENT_OTHER): Payer: Medicaid Other | Admitting: Oncology

## 2021-08-22 ENCOUNTER — Inpatient Hospital Stay: Payer: Medicaid Other | Admitting: Family Medicine

## 2021-08-22 VITALS — BP 111/68 | HR 112 | Temp 97.1°F | Resp 18 | Wt 107.5 lb

## 2021-08-22 DIAGNOSIS — D539 Nutritional anemia, unspecified: Secondary | ICD-10-CM

## 2021-08-22 DIAGNOSIS — C3492 Malignant neoplasm of unspecified part of left bronchus or lung: Secondary | ICD-10-CM

## 2021-08-22 DIAGNOSIS — N179 Acute kidney failure, unspecified: Secondary | ICD-10-CM

## 2021-08-22 DIAGNOSIS — R7401 Elevation of levels of liver transaminase levels: Secondary | ICD-10-CM | POA: Diagnosis not present

## 2021-08-22 DIAGNOSIS — C3432 Malignant neoplasm of lower lobe, left bronchus or lung: Secondary | ICD-10-CM | POA: Diagnosis not present

## 2021-08-22 LAB — CBC WITH DIFFERENTIAL/PLATELET
Abs Immature Granulocytes: 0.03 10*3/uL (ref 0.00–0.07)
Basophils Absolute: 0 10*3/uL (ref 0.0–0.1)
Basophils Relative: 0 %
Eosinophils Absolute: 0 10*3/uL (ref 0.0–0.5)
Eosinophils Relative: 0 %
HCT: 27.6 % — ABNORMAL LOW (ref 36.0–46.0)
Hemoglobin: 8.7 g/dL — ABNORMAL LOW (ref 12.0–15.0)
Immature Granulocytes: 0 %
Lymphocytes Relative: 9 %
Lymphs Abs: 0.8 10*3/uL (ref 0.7–4.0)
MCH: 31.2 pg (ref 26.0–34.0)
MCHC: 31.5 g/dL (ref 30.0–36.0)
MCV: 98.9 fL (ref 80.0–100.0)
Monocytes Absolute: 0.5 10*3/uL (ref 0.1–1.0)
Monocytes Relative: 6 %
Neutro Abs: 7 10*3/uL (ref 1.7–7.7)
Neutrophils Relative %: 85 %
Platelets: 332 10*3/uL (ref 150–400)
RBC: 2.79 MIL/uL — ABNORMAL LOW (ref 3.87–5.11)
RDW: 15.6 % — ABNORMAL HIGH (ref 11.5–15.5)
WBC: 8.3 10*3/uL (ref 4.0–10.5)
nRBC: 0 % (ref 0.0–0.2)

## 2021-08-22 LAB — COMPREHENSIVE METABOLIC PANEL
ALT: 137 U/L — ABNORMAL HIGH (ref 0–44)
AST: 92 U/L — ABNORMAL HIGH (ref 15–41)
Albumin: 3 g/dL — ABNORMAL LOW (ref 3.5–5.0)
Alkaline Phosphatase: 355 U/L — ABNORMAL HIGH (ref 38–126)
Anion gap: 11 (ref 5–15)
BUN: 44 mg/dL — ABNORMAL HIGH (ref 8–23)
CO2: 24 mmol/L (ref 22–32)
Calcium: 8.6 mg/dL — ABNORMAL LOW (ref 8.9–10.3)
Chloride: 102 mmol/L (ref 98–111)
Creatinine, Ser: 1.94 mg/dL — ABNORMAL HIGH (ref 0.44–1.00)
GFR, Estimated: 29 mL/min — ABNORMAL LOW (ref >60)
Glucose, Bld: 231 mg/dL — ABNORMAL HIGH (ref 70–99)
Potassium: 3.7 mmol/L (ref 3.5–5.1)
Sodium: 137 mmol/L (ref 135–145)
Total Bilirubin: 0.6 mg/dL (ref 0.3–1.2)
Total Protein: 6 g/dL — ABNORMAL LOW (ref 6.5–8.1)

## 2021-08-22 MED ORDER — SODIUM CHLORIDE 0.9 % IV SOLN
Freq: Once | INTRAVENOUS | Status: AC
  Filled 2021-08-22: qty 250

## 2021-08-22 MED ORDER — HEPARIN SOD (PORK) LOCK FLUSH 100 UNIT/ML IV SOLN
500.0000 [IU] | Freq: Once | INTRAVENOUS | Status: AC
  Filled 2021-08-22: qty 5

## 2021-08-22 MED ORDER — SODIUM CHLORIDE 0.9% FLUSH
10.0000 mL | Freq: Once | INTRAVENOUS | Status: AC
  Administered 2021-08-22: 10 mL via INTRAVENOUS
  Filled 2021-08-22: qty 10

## 2021-08-22 NOTE — Progress Notes (Signed)
Hematology/Oncology Progress note [phone] [phone]      Patient Care Team: [name], MD as PCP - General (Family Medicine) Earlie Server, MD as Consulting Physician (Hematology and Oncology)  REFERRING PROVIDER: [name], MD  CHIEF COMPLAINTS/REASON FOR VISIT:  Follow up for stage IIIA lung cancer  HISTORY OF PRESENTING ILLNESS:  Patient was recently seen by urology Dr. Candiss Norse ski for evaluation of microscopic hematuria, frequent UTI. 11/17/2020, CT hematuria work-up was obtained which showed no evidence of urinary tract calculus or hydronephrosis.  Small left renal cyst. Incidental findings of 1.9 x 1.2 cm left lower lobe pulmonary nodule worrisome for malignancy. Patient was referred to establish care with oncology for further evaluation  Patient denies any shortness of breath, cough, hemoptysis, unintentional weight loss, fever or night sweats. She currently smokes 1 cigar/day.  She started smoking cigarettes at age of 38.  She quitted at age of 32 and restarted smoking cigar a few years ago.  Patient has a history of depression and is on Cymbalta.  Diabetes, hyperlipidemia, hypertension, peripheral artery disease Per patient she also has history of heart attack in 2011. History of right lower extremity BKA in 2021, 02/15/2020 history of pulmonary embolism involving segmental and subsegmental branches of right upper, middle, lower lobe pulmonary arteries.  She was put on on apixaban. Patient lives with her aunt.  12/07/2020, PET scan showed left lower lobe 1.3 x 1.8 cm lung nodule with SUV of 6.5.  Suspect early stage primary lung neoplasm.  No hilar or mediastinal lymphadenopathy activity.  # 12/23/2020 S/p CT guided biopsy of left lower lobe mass.  Pathology showed non-small cell carcinoma, favor adenocarcinoma.  Positive for TTF-1, negative for p40.     # 01/17/2021, patient underwent left lower lobectomy with lymph node dissection. Pathology  showed invasive poorly differentiated adenocarcinoma, 2.2 cm.  Tumor abuts pleura but visceral pleural surface is not involved by carcinoma.  LVI invasion is present, resection margins are negative for carcinoma.  10 lymph nodes were harvested and 3 lymph nodes were involved with carcinoma. pT1c pN2 Patient's case was discussed at [facility] oncology tumor board.  And consensus recommendation was adjuvant chemotherapy and sent tissue for molecular/PD-L1 testing.  Foundation one testing showed PD-L1 1,  No reportable alterations ERBB2 S310F  # 02/28/2021 adjuvant carboplatin and Taxol 07/29/2021 Medi port placed by IR.   INTERVAL HISTORY [name] [name] is a 66 y.o. female who has above history reviewed by me today presents for follow up visit for Stage IIIA lung cancer.  Patient reports feeling well. Blood pressure is better.  She remains tachycardic. Denies any nausea vomiting diarrhea. Patient reports drinking adequately.  Denies any flank pain, burning sensation, urinary difficulty.   Review of Systems  Constitutional:  Positive for appetite change and fatigue. Negative for chills and fever.  HENT:   Negative for hearing loss and voice change.   Eyes:  Negative for eye problems.  Respiratory:  Negative for chest tightness and cough.   Cardiovascular:  Negative for chest pain.  Gastrointestinal:  Negative for abdominal distention, abdominal pain, blood in stool, diarrhea and nausea.  Endocrine: Negative for hot flashes.  Genitourinary:  Negative for difficulty urinating, dysuria and frequency.   Musculoskeletal:  Negative for arthralgias.  Skin:  Negative for itching and rash.  Neurological:  Negative for extremity weakness.  Hematological:  Negative for adenopathy.  Psychiatric/Behavioral:  Negative for confusion.    MEDICAL HISTORY:  Past Medical History:  Diagnosis Date  Anemia    Cancer (HCC)    CHF (congestive heart failure) ([place])    02/16/20 HFrEF 20-25% in setting of  acute DVT/PE, underlying CAD   CKD (chronic kidney disease)    Coronary artery disease    02/20/20: CTO pRCA with left-to-right collaterals, 50% mLAD, 80% OM1, medical therapy   Depression    Diabetes ([place])    Diabetes mellitus without complication ([place])    DVT (deep venous thrombosis) ([place]) 02/16/2020   RLE DVT proximal veins 02/16/20 Duplex   Hyperlipidemia    Hypertension    Myocardial infarction [facility]) 2009   Stress related per patient; NSTEMI 2012 100% RCA with left-to-right collaterals   PE (pulmonary thromboembolism) ([place]) 02/15/2020   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, RLE BKA   Peripheral artery disease ([place])     SURGICAL HISTORY: Past Surgical History:  Procedure Laterality Date   IR IMAGING GUIDED PORT INSERTION  07/27/2021   Right lower extremity BKA Right     SOCIAL HISTORY: Social History   Socioeconomic History   Marital status: Married    Spouse name: Not on file   Number of children: Not on file   Years of education: Not on file   Highest education level: Not on file  Occupational History   Not on file  Tobacco Use   Smoking status: Former    Packs/day: 0.25    Types: Cigars, Cigarettes    Quit date: 01/17/2021    Years since quitting: 0.5   Smokeless tobacco: Never  Vaping Use   Vaping Use: Never used  Substance and Sexual Activity   Alcohol use: Never   Drug use: Not Currently   Sexual activity: Not Currently  Other Topics Concern   Not on file  Social History Narrative   Not on file   Social Determinants of Health   Financial Resource Strain: Not on file  Food Insecurity: Not on file  Transportation Needs: Not on file  Physical Activity: Not on file  Stress: Not on file  Social Connections: Not on file  Intimate Partner Violence: Not on file    FAMILY HISTORY: Family History  Problem Relation Age of Onset   Heart disease Mother    Diabetes Mother    Heart disease Father    Diabetes Father     ALLERGIES:  is allergic  to lisinopril.  MEDICATIONS:  Current Outpatient Medications  Medication Sig Dispense Refill   ACCU-CHEK GUIDE test strip USE UP TP 4 TIMES A DAY AS DIRECTED 100 strip 1   Accu-Chek Softclix Lancets lancets SMARTSIG:Topical 1-4 Times Daily     aspirin EC 81 MG tablet Take 81 mg by mouth in the morning. Swallow whole.     blood glucose meter kit and supplies KIT Dispense based on patient and insurance preference. Use up to four times daily as directed. 1 each 0   carboxymethylcellulose (REFRESH PLUS) 0.5 % SOLN Place 1 drop into both eyes in the morning and at bedtime.     diphenhydrAMINE (BENADRYL) 25 MG tablet Take 25 mg by mouth at bedtime.     docusate sodium (COLACE) 100 MG capsule Take 1 capsule (100 mg total) by mouth 2 (two) times daily. 60 capsule 1   dronabinol (MARINOL) 2.5 MG capsule Take 1 capsule (2.5 mg total) by mouth 2 (two) times daily before a meal. 60 capsule 0   DULoxetine (CYMBALTA) 20 MG capsule TAKE 2 CAPSULES (40 MG TOTAL) BY MOUTH IN THE MORNING AND  AT BEDTIME. 180 capsule 2   gabapentin (NEURONTIN) 100 MG capsule Take 1 capsule (100 mg total) by mouth 2 (two) times daily. 60 capsule 5   HUMALOG KWIKPEN 100 UNIT/ML KwikPen Inject 8 Units into the skin 3 (three) times daily after meals.     isosorbide dinitrate (ISORDIL) 30 MG tablet TAKE 1 TABLET (30 MG TOTAL) BY MOUTH IN THE MORNING, AT NOON, AND AT BEDTIME. 90 tablet 0   JARDIANCE 10 MG TABS tablet TAKE 1 TABLET BY MOUTH EVERY DAY 90 tablet 0   ketoconazole (NIZORAL) 2 % cream APPLY 1 APPLICATION TOPICALLY IN THE MORNING AND AT BEDTIME. APPLIED TO FEET 60 g 1   LANTUS SOLOSTAR 100 UNIT/ML Solostar Pen Inject 20 Units into the skin at bedtime. 15 mL 0   lidocaine-prilocaine (EMLA) cream Apply 1 application topically as needed. Apply to port and cover with saran wrap 1-2 hours prior to port access 30 g 1   metFORMIN (GLUCOPHAGE-XR) 500 MG 24 hr tablet TAKE 1 TABLET BY MOUTH TWICE A DAY 180 tablet 2   metoprolol  succinate (TOPROL-XL) 50 MG 24 hr tablet Take 1 tablet (50 mg total) by mouth daily. Take with or immediately following a meal. 90 tablet 1   Multiple Vitamin (MULTIVITAMIN WITH MINERALS) TABS tablet Take 1 tablet by mouth in the morning. Adults 50+     ondansetron (ZOFRAN) 8 MG tablet Take 1 tablet (8 mg total) by mouth 2 (two) times daily as needed (Nausea or vomiting). Start if needed on the third day after cisplatin. 30 tablet 1   pantoprazole (PROTONIX) 40 MG tablet Take 1 tablet (40 mg total) by mouth daily. 30 tablet 1   predniSONE (DELTASONE) 20 MG tablet Take 3 tablets (60 mg total) by mouth daily with breakfast for 10 days. 30 tablet 0   prochlorperazine (COMPAZINE) 10 MG tablet Take 1 tablet (10 mg total) by mouth every 6 (six) hours as needed (Nausea or vomiting). 30 tablet 1   spironolactone (ALDACTONE) 25 MG tablet TAKE 1 TABLET (25 MG TOTAL) BY MOUTH DAILY. 90 tablet 0   No current facility-administered medications for this visit.   Facility-Administered Medications Ordered in Other Visits  Medication Dose Route Frequency Provider Last Rate Last Admin   heparin lock flush 100 UNIT/ML injection            heparin lock flush 100 unit/mL  500 Units Intravenous Once Earlie Server, MD         PHYSICAL EXAMINATION: ECOG PERFORMANCE STATUS: 1 - Symptomatic but completely ambulatory Vitals:   08/22/21 1409  BP: 111/68  Pulse: (!) 112  Resp: 18  Temp: (!) 97.1 F (36.2 C)  SpO2: 99%   Filed Weights   08/22/21 1409  Weight: 107 lb 8 oz (48.8 kg)    Physical Exam Constitutional:      General: She is not in acute distress.    Appearance: She is not ill-appearing.  HENT:     Head: Normocephalic and atraumatic.  Eyes:     General: No scleral icterus. Cardiovascular:     Rate and Rhythm: Normal rate and regular rhythm.     Heart sounds: Normal heart sounds.  Pulmonary:     Effort: Pulmonary effort is normal. No respiratory distress.     Breath sounds: No wheezing.      Comments: Absent breath sounds left basilar Abdominal:     General: Bowel sounds are normal. There is no distension.     Palpations: Abdomen is soft.  Musculoskeletal:        General: No deformity. Normal range of motion.     Cervical back: Normal range of motion and neck supple.     Comments: Right lower extremity BKA  Skin:    General: Skin is warm and dry.     Findings: No erythema or rash.  Neurological:     Mental Status: She is alert and oriented to person, place, and time. Mental status is at baseline.     Cranial Nerves: No cranial nerve deficit.     Coordination: Coordination normal.  Psychiatric:        Mood and Affect: Mood normal.    LABORATORY DATA:  I have reviewed the data as listed Lab Results  Component Value Date   WBC 8.3 08/22/2021   HGB 8.7 (L) 08/22/2021   HCT 27.6 (L) 08/22/2021   MCV 98.9 08/22/2021   PLT 332 08/22/2021   Recent Labs    08/02/21 1106 08/02/21 1108 08/18/21 0821 08/19/21 1100 08/22/21 1344  NA  --    < > 138 138 137  K  --    < > 4.1 4.3 3.7  CL  --    < > 108 108 102  CO2  --    < > 21* 22 24  GLUCOSE  --    < > 344* 315* 231*  BUN  --    < > 40* 35* 44*  CREATININE  --    < > 1.64* 1.64* 1.94*  CALCIUM  --    < > 8.1* 8.4* 8.6*  GFRNONAA  --    < > 35* 35* 29*  PROT 6.5   < > 5.9* 5.8* 6.0*  ALBUMIN 3.2*   < > 2.9* 2.7* 3.0*  AST 1,220*   < > 103* 85* 92*  ALT 777*   < > 181* 148* 137*  ALKPHOS  --    < > 410* 369* 355*  BILITOT 2.6*   < > 0.6 0.3 0.6  BILIDIR 1.9*  --   --   --   --   IBILI 0.7  --   --   --   --    < > = values in this interval not displayed.    Iron/TIBC/Ferritin/ %Sat    Component Value Date/Time   IRON 74 08/18/2021 0821   TIBC 400 08/18/2021 0821   FERRITIN 61 08/18/2021 0821   IRONPCTSAT 19 08/18/2021 0821      RADIOGRAPHIC STUDIES: I have personally reviewed the radiological images as listed and agreed with the findings in the report. US Venous Img Lower Bilateral  Result Date:  08/02/2021 CLINICAL DATA:  Shortness of breath EXAM: BILATERAL LOWER EXTREMITY VENOUS DOPPLER ULTRASOUND TECHNIQUE: Gray-scale sonography with graded compression, as well as color Doppler and duplex ultrasound were performed to evaluate the lower extremity deep venous systems from the level of the common femoral vein and including the common femoral, femoral, profunda femoral, popliteal and calf veins including the posterior tibial, peroneal and gastrocnemius veins when visible. The superficial great saphenous vein was also interrogated. Spectral Doppler was utilized to evaluate flow at rest and with distal augmentation maneuvers in the common femoral, femoral and popliteal veins. COMPARISON:  None. FINDINGS: RIGHT LOWER EXTREMITY Common Femoral Vein: No evidence of thrombus. Normal compressibility, respiratory phasicity and response to augmentation. Saphenofemoral Junction: No evidence of thrombus. Normal compressibility and flow on color Doppler imaging. Profunda Femoral Vein: No evidence of thrombus. Normal compressibility and flow on color  Doppler imaging. Femoral Vein: No evidence of thrombus. Normal compressibility, respiratory phasicity and response to augmentation. Popliteal Vein: No evidence of thrombus. Normal compressibility, respiratory phasicity and response to augmentation. Calf Veins: Prior below-knee amputation. Superficial Great Saphenous Vein: No evidence of thrombus. Normal compressibility. Venous Reflux:  None. Other Findings:  None. LEFT LOWER EXTREMITY Common Femoral Vein: No evidence of thrombus. Normal compressibility, respiratory phasicity and response to augmentation. Saphenofemoral Junction: No evidence of thrombus. Normal compressibility and flow on color Doppler imaging. Profunda Femoral Vein: No evidence of thrombus. Normal compressibility and flow on color Doppler imaging. Femoral Vein: No evidence of thrombus. Normal compressibility, respiratory phasicity and response to augmentation.  Popliteal Vein: No evidence of thrombus. Normal compressibility, respiratory phasicity and response to augmentation. Calf Veins: No evidence of thrombus. Normal compressibility and flow on color Doppler imaging. Superficial Great Saphenous Vein: No evidence of thrombus. Normal compressibility. Venous Reflux:  None. Other Findings:  None. IMPRESSION: No evidence of deep venous thrombosis in either lower extremity. Electronically Signed   By: Maurine Simmering M.D.   On: 08/02/2021 15:37   DG Chest Portable 1 View  Result Date: 08/02/2021 CLINICAL DATA:  A 66 year old female presents for evaluation of shortness of breath. EXAM: PORTABLE CHEST 1 VIEW COMPARISON:  April 26, 2021. FINDINGS: RIGHT IJ Port-A-Cath with tip over the distal superior vena cava. EKG leads project over the chest. LEFT hemidiaphragm remains elevated. Distortion of cardiomediastinal contours related to this hemidiaphragmatic elevation with similar appearance to prior imaging. Heart size and mediastinal contours are normal accounting for this finding. No lobar consolidation. No sign of effusion or pneumothorax. On limited assessment there is no acute skeletal process. IMPRESSION: 1. Chronic elevation of the LEFT hemidiaphragm without acute cardiopulmonary disease. 2. RIGHT IJ Port-A-Cath terminating in the area of the distal superior vena cava. Electronically Signed   By: Zetta Bills M.D.   On: 08/02/2021 14:52   IR IMAGING GUIDED PORT INSERTION  Result Date: 07/27/2021 INDICATION: History of lung cancer EXAM: Ultrasound-guided puncture of the right internal jugular vein Placement of a right-sided chest port using fluoroscopic guidance MEDICATIONS: None ANESTHESIA/SEDATION: Moderate (conscious) sedation was employed during this procedure. A total of Versed 1 mg and Fentanyl 50 mcg was administered intravenously. Moderate Sedation Time: 20 minutes. The patient's level of consciousness and vital signs were monitored continuously by radiology  nursing throughout the procedure under my direct supervision. FLUOROSCOPY TIME:  Fluoroscopy Time: 0.3 minutes (2.5 mGy) COMPLICATIONS: None immediate. PROCEDURE: Informed written consent was obtained from the patient after a thorough discussion of the procedural risks, benefits and alternatives. All questions were addressed. Maximal Sterile Barrier Technique was utilized including caps, mask, sterile gowns, sterile gloves, sterile drape, hand hygiene and skin antiseptic. A timeout was performed prior to the initiation of the procedure. The patient was placed supine on the exam table. The right neck and chest was prepped and draped in the standard sterile fashion. A preliminary ultrasound of the right neck was performed and demonstrates a patent right internal jugular vein. A permanent ultrasound image was stored in the electronic medical record. The overlying skin was anesthetized with 1% Lidocaine. Using ultrasound guidance, access was obtained into the right internal jugular vein using a 21 gauge micropuncture set. A wire was advanced into the SVC, a short incision was made at the puncture site, and serial dilatation performed. Next, in an ipsilateral infraclavicular location, an incision was made at the site of the subcutaneous reservoir. Blunt dissection was used to open a pocket  to contain the reservoir. A subcutaneous tunnel was then created from the port site to the puncture site. A(n) 8 Fr single lumen catheter was advanced through the tunnel. The catheter was attached to the port and this was placed in the subcutaneous pocket. Under fluoroscopic guidance, a peel away sheath was placed, and the catheter was trimmed to the appropriate length and was advanced into the central veins. The catheter length is 22 cm. The tip of the catheter lies near the superior cavoatrial junction. The port flushes and aspirates appropriately. The port was flushed and locked with heparinized saline. The port pocket was closed in  2 layers using 3-0 and 4-0 Vicryl/absorbable suture. Dermabond was also applied to both incisions. The patient tolerated the procedure well and was transferred to recovery in stable condition. IMPRESSION: Successful placement of a right chest port via the right internal jugular vein. The port is ready for immediate use. Electronically Signed   By: Albin Felling M.D.   On: 07/27/2021 12:30   US Abdomen Limited RUQ (LIVER/GB)  Result Date: 08/03/2021 CLINICAL DATA:  Transaminitis EXAM: ULTRASOUND ABDOMEN LIMITED RIGHT UPPER QUADRANT COMPARISON:  07/28/2021 FINDINGS: Gallbladder: Status post cholecystectomy Common bile duct: Diameter: 3 mm Liver: No focal lesion identified. Within normal limits in parenchymal echogenicity. Portal vein is patent on color Doppler imaging with normal direction of blood flow towards the liver. Other: None. IMPRESSION: Normal sonographic appearance of the liver. Electronically Signed   By: Miachel Roux M.D.   On: 08/03/2021 08:37   US ABDOMEN LIMITED RUQ (LIVER/GB)  Result Date: 07/28/2021 CLINICAL DATA:  Elevated liver enzymes. EXAM: ULTRASOUND ABDOMEN LIMITED RIGHT UPPER QUADRANT COMPARISON:  November 17, 2020. FINDINGS: Gallbladder: Patient is post cholecystectomy as evidenced on prior CT imaging. Common bile duct: Diameter: 5.4 mm Liver: No focal lesion identified. Within normal limits in parenchymal echogenicity. Portal vein is patent on color Doppler imaging with normal direction of blood flow towards the liver. Other: None. IMPRESSION: Post cholecystectomy without signs of biliary duct distension. No parenchymal abnormalities noted in the liver. Electronically Signed   By: Zetta Bills M.D.   On: 07/28/2021 15:13      ASSESSMENT & PLAN:  1. Non-small cell cancer of left lung ([place])   2. AKI (acute kidney injury) ([place])   3. Transaminitis   4. Macrocytic anemia     Cancer Staging  Non-small cell lung cancer [facility]) Staging form: Lung, AJCC 8th Edition - Pathologic stage  from 01/28/2021: Stage IIIA (pT1c, pN2, cM0) - Signed by Earlie Server, MD on 02/17/2021  #Stage IIIA left lung non-small cell lung cancer-.  Poorly differentiated adenocarcinoma.  Status post left lower lobectomy and lymph node dissection pT1c pN2 [pathology addendum changed to N2]  She has negative surgical margin, no clear benefit of postoperative RT.  S/p 4 cycles of adjuvant chemotherapy Carboplatin and Taxol.  S/p 1 cycle of Tecentriq. 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 Patient is on prednisone 2m daily. Plan to decrease to 41mnext week if her liver function continues to trend down. Otherwise will keep at 6056m # AKI  IV normal saline 1L x 1 today.  Obtain renal ultrasound.   #hyperglycemia due to DM and being on steroid.  Diabetic diet. Follow up with Dr.Jones for DM medication management.    All questions were answered. The patient knows to call the clinic with any problems questions or concerns.  cc JonJuline PatchD  Return of visit:  Lab CMP+/- IVF in 2-3 days.  1 week, lab CMP NP +/- IVF 2 weeks lab MD +/- IVF  Earlie Server, MD, PhD 08/22/2021

## 2021-08-22 NOTE — Progress Notes (Unsigned)
Pt was added on for IVF's this afternoon at 2:50 pm. Pt has another appt today at 3PM with Dr Otilio Miu. RN calling office to explain and re schedule appt with PCP. ?

## 2021-08-23 ENCOUNTER — Ambulatory Visit: Admission: RE | Admit: 2021-08-23 | Payer: Medicaid Other | Source: Ambulatory Visit

## 2021-08-24 ENCOUNTER — Ambulatory Visit
Admission: RE | Admit: 2021-08-24 | Discharge: 2021-08-24 | Disposition: A | Discharge status: Home / Self Care | Payer: Medicaid Other | Source: Ambulatory Visit | Attending: Oncology | Admitting: Oncology

## 2021-08-24 ENCOUNTER — Inpatient Hospital Stay: Payer: Medicaid Other

## 2021-08-24 ENCOUNTER — Other Ambulatory Visit: Payer: Self-pay

## 2021-08-24 VITALS — BP 146/76 | HR 110 | Temp 97.0°F | Resp 18

## 2021-08-24 DIAGNOSIS — C3492 Malignant neoplasm of unspecified part of left bronchus or lung: Secondary | ICD-10-CM

## 2021-08-24 DIAGNOSIS — C3432 Malignant neoplasm of lower lobe, left bronchus or lung: Secondary | ICD-10-CM | POA: Diagnosis not present

## 2021-08-24 DIAGNOSIS — N179 Acute kidney failure, unspecified: Secondary | ICD-10-CM | POA: Insufficient documentation

## 2021-08-24 DIAGNOSIS — N281 Cyst of kidney, acquired: Secondary | ICD-10-CM | POA: Diagnosis not present

## 2021-08-24 LAB — COMPREHENSIVE METABOLIC PANEL
ALT: 118 U/L — ABNORMAL HIGH (ref 0–44)
AST: 81 U/L — ABNORMAL HIGH (ref 15–41)
Albumin: 2.9 g/dL — ABNORMAL LOW (ref 3.5–5.0)
Alkaline Phosphatase: 342 U/L — ABNORMAL HIGH (ref 38–126)
Anion gap: 8 (ref 5–15)
BUN: 41 mg/dL — ABNORMAL HIGH (ref 8–23)
CO2: 25 mmol/L (ref 22–32)
Calcium: 8.6 mg/dL — ABNORMAL LOW (ref 8.9–10.3)
Chloride: 106 mmol/L (ref 98–111)
Creatinine, Ser: 1.96 mg/dL — ABNORMAL HIGH (ref 0.44–1.00)
GFR, Estimated: 28 mL/min — ABNORMAL LOW (ref >60)
Glucose, Bld: 288 mg/dL — ABNORMAL HIGH (ref 70–99)
Potassium: 3.9 mmol/L (ref 3.5–5.1)
Sodium: 139 mmol/L (ref 135–145)
Total Bilirubin: 0.6 mg/dL (ref 0.3–1.2)
Total Protein: 5.9 g/dL — ABNORMAL LOW (ref 6.5–8.1)

## 2021-08-24 MED ORDER — HEPARIN SOD (PORK) LOCK FLUSH 100 UNIT/ML IV SOLN
500.0000 [IU] | Freq: Once | INTRAVENOUS | Status: AC
  Administered 2021-08-24: 500 [IU] via INTRAVENOUS
  Filled 2021-08-24: qty 5

## 2021-08-24 MED ORDER — SODIUM CHLORIDE 0.9% FLUSH
10.0000 mL | Freq: Once | INTRAVENOUS | Status: AC
  Administered 2021-08-24: 10 mL via INTRAVENOUS
  Filled 2021-08-24: qty 10

## 2021-08-24 MED ORDER — SODIUM CHLORIDE 0.9 % IV SOLN
Freq: Once | INTRAVENOUS | Status: AC
  Filled 2021-08-24: qty 250

## 2021-08-24 NOTE — Patient Instructions (Signed)

## 2021-08-24 NOTE — Progress Notes (Signed)
Patient tolerated 1 L IVF infusion well, no concerns voiced. Port deaccessed. Patient transported to medical mall for ultrasound. CC Front desk assistant aware to call transportation when patient is close to completion. Stable.  ?

## 2021-08-25 ENCOUNTER — Inpatient Hospital Stay: Payer: Medicaid Other

## 2021-08-25 ENCOUNTER — Inpatient Hospital Stay: Payer: Medicaid Other | Admitting: Hospice and Palliative Medicine

## 2021-08-25 ENCOUNTER — Ambulatory Visit (INDEPENDENT_AMBULATORY_CARE_PROVIDER_SITE_OTHER): Payer: Medicaid Other | Admitting: Family Medicine

## 2021-08-25 ENCOUNTER — Other Ambulatory Visit
Admission: RE | Admit: 2021-08-25 | Discharge: 2021-08-25 | Disposition: A | Discharge status: Home / Self Care | Payer: Medicaid Other | Attending: Family Medicine | Admitting: Family Medicine

## 2021-08-25 ENCOUNTER — Ambulatory Visit: Payer: Medicaid Other | Admitting: Family Medicine

## 2021-08-25 ENCOUNTER — Encounter: Payer: Self-pay | Admitting: Family Medicine

## 2021-08-25 VITALS — BP 170/98 | HR 106 | Ht 65.0 in | Wt 104.0 lb

## 2021-08-25 DIAGNOSIS — I25118 Atherosclerotic heart disease of native coronary artery with other forms of angina pectoris: Secondary | ICD-10-CM | POA: Diagnosis not present

## 2021-08-25 DIAGNOSIS — E7801 Familial hypercholesterolemia: Secondary | ICD-10-CM

## 2021-08-25 DIAGNOSIS — F329 Major depressive disorder, single episode, unspecified: Secondary | ICD-10-CM | POA: Diagnosis not present

## 2021-08-25 DIAGNOSIS — I10 Essential (primary) hypertension: Secondary | ICD-10-CM

## 2021-08-25 DIAGNOSIS — I5020 Unspecified systolic (congestive) heart failure: Secondary | ICD-10-CM | POA: Insufficient documentation

## 2021-08-25 DIAGNOSIS — G893 Neoplasm related pain (acute) (chronic): Secondary | ICD-10-CM

## 2021-08-25 DIAGNOSIS — Z794 Long term (current) use of insulin: Secondary | ICD-10-CM

## 2021-08-25 DIAGNOSIS — E1151 Type 2 diabetes mellitus with diabetic peripheral angiopathy without gangrene: Secondary | ICD-10-CM

## 2021-08-25 LAB — BRAIN NATRIURETIC PEPTIDE: B Natriuretic Peptide: 124.2 pg/mL — ABNORMAL HIGH (ref 0.0–100.0)

## 2021-08-25 MED ORDER — EMPAGLIFLOZIN 10 MG PO TABS: 10.0000 mg | ORAL_TABLET | Freq: Every day | ORAL | 3 refills | Status: DC

## 2021-08-25 MED ORDER — HUMALOG KWIKPEN 100 UNIT/ML ~~LOC~~ SOPN: 8.0000 [IU] | PEN_INJECTOR | Freq: Three times a day (TID) | SUBCUTANEOUS | 11 refills | Status: DC

## 2021-08-25 MED ORDER — DULOXETINE HCL 20 MG PO CPEP: 40.0000 mg | ORAL_CAPSULE | Freq: Two times a day (BID) | ORAL | 2 refills | Status: DC

## 2021-08-25 MED ORDER — ISOSORBIDE DINITRATE 30 MG PO TABS: 30.0000 mg | ORAL_TABLET | Freq: Three times a day (TID) | ORAL | 1 refills | Status: DC

## 2021-08-25 MED ORDER — LANTUS SOLOSTAR 100 UNIT/ML ~~LOC~~ SOPN: 20.0000 [IU] | PEN_INJECTOR | Freq: Every day | SUBCUTANEOUS | 0 refills | Status: DC

## 2021-08-25 MED ORDER — SPIRONOLACTONE 25 MG PO TABS: 25.0000 mg | ORAL_TABLET | Freq: Every day | ORAL | 1 refills | Status: DC

## 2021-08-25 MED ORDER — METOPROLOL SUCCINATE ER 50 MG PO TB24: 50.0000 mg | ORAL_TABLET | Freq: Every day | ORAL | 1 refills | Status: DC

## 2021-08-25 NOTE — Addendum Note (Signed)
Addended by: Tora Duck F on: 08/25/2021 10:50 AM   Modules accepted: Orders

## 2021-08-25 NOTE — Progress Notes (Signed)
Date:  08/25/2021   Name:  [name] [name]   DOB:  [birth year]   MRN:  [id number]   Chief Complaint: Diabetes  Patient is a 66 year old female who presents for a hospital followup exam. The patient reports the following problems: hypomagnesium/transaminitis/hyperlipidemia/diabetes. Health maintenance has been reviewed .    Diabetes Pertinent negatives for hypoglycemia include no dizziness or nervousness/anxiousness. Pertinent negatives for diabetes include no fatigue.   Lab Results  Component Value Date   NA 139 08/24/2021   K 3.9 08/24/2021   CO2 25 08/24/2021   GLUCOSE 288 (H) 08/24/2021   BUN 41 (H) 08/24/2021   CREATININE 1.96 (H) 08/24/2021   CALCIUM 8.6 (L) 08/24/2021   GFRNONAA 28 (L) 08/24/2021   Lab Results  Component Value Date   CHOL 180 01/25/2021   HDL 67 01/25/2021   LDLCALC 94 01/25/2021   TRIG 95 01/25/2021   TRIG 95 01/25/2021   Lab Results  Component Value Date   TSH 2.055 08/17/2021   Lab Results  Component Value Date   HGBA1C 6.1 (H) 08/04/2021   Lab Results  Component Value Date   WBC 8.3 08/22/2021   HGB 8.7 (L) 08/22/2021   HCT 27.6 (L) 08/22/2021   MCV 98.9 08/22/2021   PLT 332 08/22/2021   Lab Results  Component Value Date   ALT 118 (H) 08/24/2021   AST 81 (H) 08/24/2021   ALKPHOS 342 (H) 08/24/2021   BILITOT 0.6 08/24/2021   No results found for: 25OHVITD2, 25OHVITD3, VD25OH   Review of Systems  Constitutional: Negative.  Negative for chills, fatigue, fever and unexpected weight change.  HENT:  Negative for congestion, rhinorrhea, sinus pressure, sneezing and sore throat.   Respiratory:  Negative for shortness of breath.   Gastrointestinal:  Negative for abdominal pain, blood in stool, constipation, diarrhea, nausea and vomiting.  Genitourinary:  Negative for difficulty urinating, vaginal bleeding and vaginal discharge.  Musculoskeletal:  Negative for arthralgias, back pain and myalgias.  Skin:  Negative for rash.   Allergic/Immunologic: Negative for environmental allergies and food allergies.  Neurological:  Negative for dizziness.  Psychiatric/Behavioral:  Negative for dysphoric mood. The patient is not nervous/anxious.    Patient Active Problem List   Diagnosis Date Noted   Debility    Liver function test abnormality    Long term systemic steroid user    Hypovolemic shock ([place]) 08/03/2021   AKI (acute kidney injury) ([place]) 08/03/2021   Transaminitis 08/02/2021   Chemotherapy-induced nausea 07/28/2021   Encounter for antineoplastic immunotherapy 07/07/2021   Macrocytic anemia 07/07/2021   Moderate protein-calorie malnutrition ([place]) 04/12/2021   Stage 3b chronic kidney disease ([place]) 04/12/2021   Normocytic anemia 04/12/2021   Dyslipidemia 04/05/2021   Encounter for antineoplastic chemotherapy 02/28/2021   Non-small cell cancer of left lung ([place]) 02/17/2021   Tinea pedis of both feet 02/10/2021   Chronic pain due to neoplasm 02/10/2021   Adenocarcinoma, lung, left ([place]) 01/19/2021   S/P Robotic Assisted Video Thoracoscopy with Left Lower Lobectomy Lung, Intercostal nerve block, lymph node dissection 01/17/2021   Non-small cell lung cancer ([place]) 12/30/2020   Depression 12/09/2020   Hyperlipidemia 12/09/2020   Primary hypertension 12/09/2020   Heart failure with reduced ejection fraction ([place]) 02/16/2020   Multiple subsegmental pulmonary emboli without acute cor pulmonale ([place]) 02/15/2020   Iron deficiency anemia 12/18/2019   Rectal pain 12/18/2019   Diabetic foot ulcer ([place]) 10/03/2019   Cellulitis 08/17/2019   Cocaine use 08/17/2019   Peripheral vascular disease (  [place]) 08/17/2019   Tobacco use disorder 08/17/2019   Tobacco abuse, in remission 08/17/2019   Cervical dysplasia 04/02/2019   CAD (coronary artery disease), native coronary artery 06/06/2010   Type II diabetes mellitus ([place]) 06/06/2010   Acute subendocardial infarction, initial episode of care [facility]) 06/05/2010    Allergies   Allergen Reactions   Lisinopril Swelling and Other (See Comments)    Recurrent AKI and hyperkalemia when initiation attempted.    Past Surgical History:  Procedure Laterality Date   IR IMAGING GUIDED PORT INSERTION  07/27/2021   Right lower extremity BKA Right     Social History   Tobacco Use   Smoking status: Former    Packs/day: 0.25    Types: Cigars, Cigarettes    Quit date: 01/17/2021    Years since quitting: 0.6   Smokeless tobacco: Never  Vaping Use   Vaping Use: Never used  Substance Use Topics   Alcohol use: Never   Drug use: Not Currently     Medication list has been reviewed and updated.  Current Meds  Medication Sig   ACCU-CHEK GUIDE test strip USE UP TP 4 TIMES A DAY AS DIRECTED   Accu-Chek Softclix Lancets lancets SMARTSIG:Topical 1-4 Times Daily   aspirin EC 81 MG tablet Take 81 mg by mouth in the morning. Swallow whole.   blood glucose meter kit and supplies KIT Dispense based on patient and insurance preference. Use up to four times daily as directed.   carboxymethylcellulose (REFRESH PLUS) 0.5 % SOLN Place 1 drop into both eyes in the morning and at bedtime.   diphenhydrAMINE (BENADRYL) 25 MG tablet Take 25 mg by mouth at bedtime.   docusate sodium (COLACE) 100 MG capsule Take 1 capsule (100 mg total) by mouth 2 (two) times daily.   dronabinol (MARINOL) 2.5 MG capsule Take 1 capsule (2.5 mg total) by mouth 2 (two) times daily before a meal.   DULoxetine (CYMBALTA) 20 MG capsule TAKE 2 CAPSULES (40 MG TOTAL) BY MOUTH IN THE MORNING AND AT BEDTIME.   gabapentin (NEURONTIN) 100 MG capsule Take 1 capsule (100 mg total) by mouth 2 (two) times daily.   HUMALOG KWIKPEN 100 UNIT/ML KwikPen Inject 8 Units into the skin 3 (three) times daily after meals.   isosorbide dinitrate (ISORDIL) 30 MG tablet TAKE 1 TABLET (30 MG TOTAL) BY MOUTH IN THE MORNING, AT NOON, AND AT BEDTIME.   JARDIANCE 10 MG TABS tablet TAKE 1 TABLET BY MOUTH EVERY DAY   ketoconazole (NIZORAL) 2 %  cream APPLY 1 APPLICATION TOPICALLY IN THE MORNING AND AT BEDTIME. APPLIED TO FEET   LANTUS SOLOSTAR 100 UNIT/ML Solostar Pen Inject 20 Units into the skin at bedtime.   lidocaine-prilocaine (EMLA) cream Apply 1 application topically as needed. Apply to port and cover with saran wrap 1-2 hours prior to port access   metFORMIN (GLUCOPHAGE-XR) 500 MG 24 hr tablet TAKE 1 TABLET BY MOUTH TWICE A DAY   metoprolol succinate (TOPROL-XL) 50 MG 24 hr tablet Take 1 tablet (50 mg total) by mouth daily. Take with or immediately following a meal.   Multiple Vitamin (MULTIVITAMIN WITH MINERALS) TABS tablet Take 1 tablet by mouth in the morning. Adults 50+   ondansetron (ZOFRAN) 8 MG tablet Take 1 tablet (8 mg total) by mouth 2 (two) times daily as needed (Nausea or vomiting). Start if needed on the third day after cisplatin.   pantoprazole (PROTONIX) 40 MG tablet Take 1 tablet (40 mg total) by mouth daily.  predniSONE (DELTASONE) 20 MG tablet Take 3 tablets (60 mg total) by mouth daily with breakfast for 10 days.   prochlorperazine (COMPAZINE) 10 MG tablet Take 1 tablet (10 mg total) by mouth every 6 (six) hours as needed (Nausea or vomiting).   spironolactone (ALDACTONE) 25 MG tablet TAKE 1 TABLET (25 MG TOTAL) BY MOUTH DAILY.    PHQ 2/9 Scores 06/15/2021 02/10/2021 12/21/2020 08/10/2020  PHQ - 2 Score 0 0 0 3  PHQ- 9 Score 0 4 0 4    GAD 7 : Generalized Anxiety Score 02/10/2021 08/10/2020 07/15/2020  Nervous, Anxious, on Edge 0 0 0  Control/stop worrying 0 0 0  Worry too much - different things 0 1 0  Trouble relaxing 0 0 0  Restless 0 0 0  Easily annoyed or irritable 1 0 0  Afraid - awful might happen 0 0 0  Total GAD 7 Score 1 1 0  Anxiety Difficulty Not difficult at all - -    BP Readings from Last 3 Encounters:  08/25/21 (!) 170/98  08/24/21 (!) 146/76  08/22/21 111/68    Physical Exam Vitals and nursing note reviewed. Exam conducted with a chaperone present.  Constitutional:      General:  She is not in acute distress.    Appearance: She is not diaphoretic.  HENT:     Head: Normocephalic and atraumatic.     Right Ear: External ear normal.     Left Ear: External ear normal.     Nose: Nose normal.  Eyes:     General:        Right eye: No discharge.        Left eye: No discharge.     Conjunctiva/sclera: Conjunctivae normal.     Pupils: Pupils are equal, round, and reactive to light.  Neck:     Thyroid: No thyromegaly.     Vascular: No JVD.  Cardiovascular:     Rate and Rhythm: Normal rate and regular rhythm.     Heart sounds: Normal heart sounds, S1 normal and S2 normal. No murmur heard. No systolic murmur is present.  No diastolic murmur is present.    No friction rub. No gallop. No S3 or S4 sounds.  Pulmonary:     Effort: Pulmonary effort is normal.     Breath sounds: Normal breath sounds. No wheezing or rhonchi.  Abdominal:     General: Bowel sounds are normal.     Palpations: Abdomen is soft. There is no mass.     Tenderness: There is no abdominal tenderness. There is no guarding.  Musculoskeletal:     Cervical back: Normal range of motion and neck supple.  Lymphadenopathy:     Cervical: No cervical adenopathy.  Skin:    General: Skin is warm.  Neurological:     Mental Status: She is alert.     Deep Tendon Reflexes: Reflexes are normal and symmetric.    Wt Readings from Last 3 Encounters:  08/25/21 104 lb (47.2 kg)  08/22/21 107 lb 8 oz (48.8 kg)  08/15/21 116 lb 11.2 oz (52.9 kg)    BP (!) 170/98    Pulse (!) 106    Ht '5\' 5"'  (1.651 m)    Wt 104 lb (47.2 kg)    SpO2 99%    BMI 17.31 kg/m   Assessment and Plan: This is an extension of hospital follow-up which patient did not show on multiple occasions. 1. Primary hypertension Chronic.  Uncontrolled.  Stable.  I am  not certain that patient's had her medication to take. - metoprolol succinate (TOPROL-XL) 50 MG 24 hr tablet; Take 1 tablet (50 mg total) by mouth daily. Take with or immediately following  a meal.  Dispense: 90 tablet; Refill: 1  2. Familial hypercholesterolemia Apparently his statins were discontinued in the hospital and we have been asked to recheck lipid panel to see if this needs to be resumed. - Lipid Panel With LDL/HDL Ratio  3. Reactive depression PHQ 0 gad score 0.  Noted we are not doing her duloxetine which is done by oncology for pain  4. Hypomagnesemia Patient had decreased magnesium and hospital however we have checked magnesium at this time. - Magnesium  5. Type 2 diabetes mellitus with diabetic peripheral angiopathy without gangrene, with long-term current use of insulin ([place]) Patient's had multiple appointments set up for endocrinology she has missed several.  We will refill her Jardiance and patient states that she has plenty of her metformin.  We have also refilled her insulins as well we will check A1c - Hemoglobin A1c - empagliflozin (JARDIANCE) 10 MG TABS tablet; Take 1 tablet (10 mg total) by mouth daily.  Dispense: 90 tablet; Refill: 3  6. Chronic pain due to neoplasm We are not following this this is oncology - DULoxetine (CYMBALTA) 20 MG capsule; Take 2 capsules (40 mg total) by mouth in the morning and at bedtime.  Dispense: 180 capsule; Refill: 2  7. Coronary artery disease of native heart with stable angina pectoris, unspecified vessel or lesion type (HCC) Chronic.  Controlled.  Supposed to be followed by cardiology but patient has not gone to appointments as suggested.  For the time being we will refill her spironolactone 25 mg 1 daily and isosorbide 1 tablet 3 times a day. - spironolactone (ALDACTONE) 25 MG tablet; Take 1 tablet (25 mg total) by mouth daily.  Dispense: 90 tablet; Refill: 1 - isosorbide dinitrate (ISORDIL) 30 MG tablet; Take 1 tablet (30 mg total) by mouth in the morning, at noon, and at bedtime.  Dispense: 90 tablet; Refill: 1

## 2021-08-26 ENCOUNTER — Other Ambulatory Visit: Payer: Self-pay | Admitting: Oncology

## 2021-08-27 LAB — MAGNESIUM: Magnesium: 2.3 mg/dL (ref 1.6–2.3)

## 2021-08-27 LAB — LIPID PANEL WITH LDL/HDL RATIO
Cholesterol, Total: 212 mg/dL — ABNORMAL HIGH (ref 100–199)
HDL: 114 mg/dL (ref >39)
LDL Chol Calc (NIH): 81 mg/dL (ref 0–99)
LDL/HDL Ratio: 0.7 ratio (ref 0.0–3.2)
Triglycerides: 99 mg/dL (ref 0–149)
VLDL Cholesterol Cal: 17 mg/dL (ref 5–40)

## 2021-08-27 LAB — HEMOGLOBIN A1C
Est. average glucose Bld gHb Est-mCnc: 171 mg/dL
Hgb A1c MFr Bld: 7.6 % — ABNORMAL HIGH (ref 4.8–5.6)

## 2021-08-29 ENCOUNTER — Other Ambulatory Visit: Payer: Self-pay

## 2021-08-29 ENCOUNTER — Inpatient Hospital Stay (HOSPITAL_BASED_OUTPATIENT_CLINIC_OR_DEPARTMENT_OTHER): Payer: Medicaid Other | Admitting: Oncology

## 2021-08-29 ENCOUNTER — Encounter: Payer: Self-pay | Admitting: Oncology

## 2021-08-29 ENCOUNTER — Inpatient Hospital Stay: Payer: Medicaid Other

## 2021-08-29 VITALS — BP 148/84 | HR 101 | Temp 101.0°F | Wt 109.0 lb

## 2021-08-29 DIAGNOSIS — C3492 Malignant neoplasm of unspecified part of left bronchus or lung: Secondary | ICD-10-CM | POA: Diagnosis not present

## 2021-08-29 DIAGNOSIS — Z95828 Presence of other vascular implants and grafts: Secondary | ICD-10-CM

## 2021-08-29 DIAGNOSIS — C3432 Malignant neoplasm of lower lobe, left bronchus or lung: Secondary | ICD-10-CM | POA: Diagnosis not present

## 2021-08-29 LAB — CBC WITH DIFFERENTIAL/PLATELET
Abs Immature Granulocytes: 0.04 10*3/uL (ref 0.00–0.07)
Basophils Absolute: 0 10*3/uL (ref 0.0–0.1)
Basophils Relative: 0 %
Eosinophils Absolute: 0 10*3/uL (ref 0.0–0.5)
Eosinophils Relative: 0 %
HCT: 27.8 % — ABNORMAL LOW (ref 36.0–46.0)
Hemoglobin: 8.9 g/dL — ABNORMAL LOW (ref 12.0–15.0)
Immature Granulocytes: 0 %
Lymphocytes Relative: 8 %
Lymphs Abs: 0.7 10*3/uL (ref 0.7–4.0)
MCH: 31.7 pg (ref 26.0–34.0)
MCHC: 32 g/dL (ref 30.0–36.0)
MCV: 98.9 fL (ref 80.0–100.0)
Monocytes Absolute: 0.4 10*3/uL (ref 0.1–1.0)
Monocytes Relative: 5 %
Neutro Abs: 8 10*3/uL — ABNORMAL HIGH (ref 1.7–7.7)
Neutrophils Relative %: 87 %
Platelets: 269 10*3/uL (ref 150–400)
RBC: 2.81 MIL/uL — ABNORMAL LOW (ref 3.87–5.11)
RDW: 14.7 % (ref 11.5–15.5)
WBC: 9.2 10*3/uL (ref 4.0–10.5)
nRBC: 0 % (ref 0.0–0.2)

## 2021-08-29 LAB — COMPREHENSIVE METABOLIC PANEL
ALT: 102 U/L — ABNORMAL HIGH (ref 0–44)
AST: 73 U/L — ABNORMAL HIGH (ref 15–41)
Albumin: 3.1 g/dL — ABNORMAL LOW (ref 3.5–5.0)
Alkaline Phosphatase: 310 U/L — ABNORMAL HIGH (ref 38–126)
Anion gap: 11 (ref 5–15)
BUN: 53 mg/dL — ABNORMAL HIGH (ref 8–23)
CO2: 25 mmol/L (ref 22–32)
Calcium: 8.1 mg/dL — ABNORMAL LOW (ref 8.9–10.3)
Chloride: 101 mmol/L (ref 98–111)
Creatinine, Ser: 2.31 mg/dL — ABNORMAL HIGH (ref 0.44–1.00)
GFR, Estimated: 23 mL/min — ABNORMAL LOW (ref >60)
Glucose, Bld: 196 mg/dL — ABNORMAL HIGH (ref 70–99)
Potassium: 3.8 mmol/L (ref 3.5–5.1)
Sodium: 137 mmol/L (ref 135–145)
Total Bilirubin: 0.6 mg/dL (ref 0.3–1.2)
Total Protein: 6 g/dL — ABNORMAL LOW (ref 6.5–8.1)

## 2021-08-29 MED ORDER — HEPARIN SOD (PORK) LOCK FLUSH 100 UNIT/ML IV SOLN
500.0000 [IU] | Freq: Once | INTRAVENOUS | Status: AC
  Administered 2021-08-29: 500 [IU] via INTRAVENOUS
  Filled 2021-08-29: qty 5

## 2021-08-29 MED ORDER — HEPARIN SOD (PORK) LOCK FLUSH 100 UNIT/ML IV SOLN
INTRAVENOUS | Status: AC
  Filled 2021-08-29: qty 5

## 2021-08-29 MED ORDER — SODIUM CHLORIDE 0.9 % IV SOLN
INTRAVENOUS | Status: DC
  Filled 2021-08-29: qty 250

## 2021-08-29 MED ORDER — DRONABINOL 2.5 MG PO CAPS: 2.5000 mg | ORAL_CAPSULE | Freq: Two times a day (BID) | ORAL | 0 refills | Status: DC

## 2021-08-29 MED ORDER — SODIUM CHLORIDE 0.9 % IV SOLN
Freq: Once | INTRAVENOUS | Status: AC
  Filled 2021-08-29: qty 250

## 2021-08-29 NOTE — Progress Notes (Signed)
Hematology/Oncology Progress note [phone] [phone]      Patient Care Team: [name], MD as PCP - General (Family Medicine) Earlie Server, MD as Consulting Physician (Hematology and Oncology)  REFERRING PROVIDER: [name], MD  CHIEF COMPLAINTS/REASON FOR VISIT:  Follow up for stage IIIA lung cancer  HISTORY OF PRESENTING ILLNESS:  Patient was recently seen by urology Dr. Candiss Norse ski for evaluation of microscopic hematuria, frequent UTI. 11/17/2020, CT hematuria work-up was obtained which showed no evidence of urinary tract calculus or hydronephrosis.  Small left renal cyst. Incidental findings of 1.9 x 1.2 cm left lower lobe pulmonary nodule worrisome for malignancy. Patient was referred to establish care with oncology for further evaluation  Patient denies any shortness of breath, cough, hemoptysis, unintentional weight loss, fever or night sweats. She currently smokes 1 cigar/day.  She started smoking cigarettes at age of 66.  She quitted at age of 66 and restarted smoking cigar a few years ago.  Patient has a history of depression and is on Cymbalta.  Diabetes, hyperlipidemia, hypertension, peripheral artery disease Per patient she also has history of heart attack in 2011. History of right lower extremity BKA in 2021, 02/15/2020 history of pulmonary embolism involving segmental and subsegmental branches of right upper, middle, lower lobe pulmonary arteries.  She was put on on apixaban. Patient lives with her aunt.  12/07/2020, PET scan showed left lower lobe 1.3 x 1.8 cm lung nodule with SUV of 6.5.  Suspect early stage primary lung neoplasm.  No hilar or mediastinal lymphadenopathy activity.  # 12/23/2020 S/p CT guided biopsy of left lower lobe mass.  Pathology showed non-small cell carcinoma, favor adenocarcinoma.  Positive for TTF-1, negative for p40.     # 01/17/2021, patient underwent left lower lobectomy with lymph node dissection. Pathology  showed invasive poorly differentiated adenocarcinoma, 2.2 cm.  Tumor abuts pleura but visceral pleural surface is not involved by carcinoma.  LVI invasion is present, resection margins are negative for carcinoma.  10 lymph nodes were harvested and 3 lymph nodes were involved with carcinoma. pT1c pN2 Patient's case was discussed at [facility] oncology tumor board.  And consensus recommendation was adjuvant chemotherapy and sent tissue for molecular/PD-L1 testing.  Foundation one testing showed PD-L1 1,  No reportable alterations ERBB2 S310F  # 02/28/2021 adjuvant carboplatin and Taxol 07/29/2021 Medi port placed by IR.   INTERVAL HISTORY Mrs. Godette is here for lab work and possible IV fluids.  She is currently being treated by Dr. [name] for stage III lung cancer.  Her last cycle of chemotherapy was on 07/07/2021.  Treatment discontinued secondary to severe liver toxicity.  Currently on prednisone 60 mg daily.  Overall, continues to feel well and stable.  Denies any new concerns.  Needs a refill on her Marinol prescription.  Feels that this is helping some.  Weight is up 5 pounds since last visit.   Review of Systems  Constitutional:  Negative for appetite change, fatigue, fever and unexpected weight change.  HENT:   Negative for nosebleeds, sore throat and trouble swallowing.   Eyes: Negative.   Respiratory: Negative.  Negative for cough, shortness of breath and wheezing.   Cardiovascular: Negative.  Negative for chest pain and leg swelling.  Gastrointestinal:  Negative for abdominal pain, blood in stool, constipation, diarrhea, nausea and vomiting.  Endocrine: Negative.   Genitourinary: Negative.  Negative for bladder incontinence, hematuria and nocturia.   Musculoskeletal: Negative.  Negative for back pain and flank pain.  Skin: Negative.  Neurological: Negative.  Negative for dizziness, headaches, light-headedness and numbness.  Hematological: Negative.   Psychiatric/Behavioral: Negative.   Negative for confusion. The patient is not nervous/anxious.    MEDICAL HISTORY:  Past Medical History:  Diagnosis Date   Anemia    Cancer ([place])    CHF (congestive heart failure) ([place])    02/16/20 HFrEF 20-25% in setting of acute DVT/PE, underlying CAD   CKD (chronic kidney disease)    Coronary artery disease    02/20/20: CTO pRCA with left-to-right collaterals, 50% mLAD, 80% OM1, medical therapy   Depression    Diabetes ([place])    Diabetes mellitus without complication ([place])    DVT (deep venous thrombosis) ([place]) 02/16/2020   RLE DVT proximal veins 02/16/20 Duplex   Hyperlipidemia    Hypertension    Myocardial infarction [facility]) 2009   Stress related per patient; NSTEMI 2012 100% RCA with left-to-right collaterals   PE (pulmonary thromboembolism) ([place]) 02/15/2020   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, RLE BKA   Peripheral artery disease ([place])     SURGICAL HISTORY: Past Surgical History:  Procedure Laterality Date   IR IMAGING GUIDED PORT INSERTION  07/27/2021   Right lower extremity BKA Right     SOCIAL HISTORY: Social History   Socioeconomic History   Marital status: Married    Spouse name: Not on file   Number of children: Not on file   Years of education: Not on file   Highest education level: Not on file  Occupational History   Not on file  Tobacco Use   Smoking status: Former    Packs/day: 0.25    Types: Cigars, Cigarettes    Quit date: 01/17/2021    Years since quitting: 0.6   Smokeless tobacco: Never  Vaping Use   Vaping Use: Never used  Substance and Sexual Activity   Alcohol use: Never   Drug use: Not Currently   Sexual activity: Not Currently  Other Topics Concern   Not on file  Social History Narrative   Not on file   Social Determinants of Health   Financial Resource Strain: Not on file  Food Insecurity: Not on file  Transportation Needs: Not on file  Physical Activity: Not on file  Stress: Not on file  Social Connections: Not on file   Intimate Partner Violence: Not on file    FAMILY HISTORY: Family History  Problem Relation Age of Onset   Heart disease Mother    Diabetes Mother    Heart disease Father    Diabetes Father     ALLERGIES:  is allergic to lisinopril.  MEDICATIONS:  Current Outpatient Medications  Medication Sig Dispense Refill   ACCU-CHEK GUIDE test strip USE UP TP 4 TIMES A DAY AS DIRECTED 100 strip 1   Accu-Chek Softclix Lancets lancets SMARTSIG:Topical 1-4 Times Daily     aspirin EC 81 MG tablet Take 81 mg by mouth in the morning. Swallow whole.     blood glucose meter kit and supplies KIT Dispense based on patient and insurance preference. Use up to four times daily as directed. 1 each 0   carboxymethylcellulose (REFRESH PLUS) 0.5 % SOLN Place 1 drop into both eyes in the morning and at bedtime.     diphenhydrAMINE (BENADRYL) 25 MG tablet Take 25 mg by mouth at bedtime.     docusate sodium (COLACE) 100 MG capsule Take 1 capsule (100 mg total) by mouth 2 (two) times daily. 60 capsule 1   dronabinol (MARINOL)  2.5 MG capsule TAKE 1 CAPSULE (2.5 MG TOTAL) BY MOUTH 2 (TWO) TIMES DAILY BEFORE A MEAL. 60 capsule 0   empagliflozin (JARDIANCE) 10 MG TABS tablet Take 1 tablet (10 mg total) by mouth daily. 90 tablet 3   gabapentin (NEURONTIN) 100 MG capsule Take 1 capsule (100 mg total) by mouth 2 (two) times daily. 60 capsule 5   HUMALOG KWIKPEN 100 UNIT/ML KwikPen Inject 8 Units into the skin 3 (three) times daily after meals. 15 mL 11   isosorbide dinitrate (ISORDIL) 30 MG tablet Take 1 tablet (30 mg total) by mouth 3 (three) times daily. 270 tablet 1   ketoconazole (NIZORAL) 2 % cream APPLY 1 APPLICATION TOPICALLY IN THE MORNING AND AT BEDTIME. APPLIED TO FEET 60 g 1   LANTUS SOLOSTAR 100 UNIT/ML Solostar Pen Inject 20 Units into the skin at bedtime. 15 mL 0   lidocaine-prilocaine (EMLA) cream Apply 1 application topically as needed. Apply to port and cover with saran wrap 1-2 hours prior to port access  30 g 1   metFORMIN (GLUCOPHAGE-XR) 500 MG 24 hr tablet TAKE 1 TABLET BY MOUTH TWICE A DAY 180 tablet 2   metoprolol succinate (TOPROL-XL) 50 MG 24 hr tablet Take 1 tablet (50 mg total) by mouth daily. Take with or immediately following a meal. 90 tablet 1   Multiple Vitamin (MULTIVITAMIN WITH MINERALS) TABS tablet Take 1 tablet by mouth in the morning. Adults 50+     ondansetron (ZOFRAN) 8 MG tablet Take 1 tablet (8 mg total) by mouth 2 (two) times daily as needed (Nausea or vomiting). Start if needed on the third day after cisplatin. 30 tablet 1   pantoprazole (PROTONIX) 40 MG tablet Take 1 tablet (40 mg total) by mouth daily. 30 tablet 1   predniSONE (DELTASONE) 20 MG tablet Take 3 tablets (60 mg total) by mouth daily with breakfast for 10 days. 30 tablet 0   prochlorperazine (COMPAZINE) 10 MG tablet Take 1 tablet (10 mg total) by mouth every 6 (six) hours as needed (Nausea or vomiting). 30 tablet 1   spironolactone (ALDACTONE) 25 MG tablet Take 1 tablet (25 mg total) by mouth daily. 90 tablet 1   No current facility-administered medications for this visit.   Facility-Administered Medications Ordered in Other Visits  Medication Dose Route Frequency Provider Last Rate Last Admin   heparin lock flush 100 UNIT/ML injection            heparin lock flush 100 unit/mL  500 Units Intravenous Once Earlie Server, MD         PHYSICAL EXAMINATION: ECOG PERFORMANCE STATUS: 1 - Symptomatic but completely ambulatory There were no vitals filed for this visit.  There were no vitals filed for this visit.   Physical Exam Constitutional:      General: She is not in acute distress.    Appearance: She is not ill-appearing.  HENT:     Head: Normocephalic and atraumatic.  Eyes:     General: No scleral icterus. Cardiovascular:     Rate and Rhythm: Normal rate and regular rhythm.     Heart sounds: Normal heart sounds.  Pulmonary:     Effort: Pulmonary effort is normal. No respiratory distress.     Breath  sounds: No wheezing.     Comments: Absent breath sounds left basilar Abdominal:     General: Bowel sounds are normal. There is no distension.     Palpations: Abdomen is soft.  Musculoskeletal:  General: No deformity. Normal range of motion.     Cervical back: Normal range of motion and neck supple.     Comments: Right lower extremity BKA  Skin:    General: Skin is warm and dry.     Findings: No erythema or rash.  Neurological:     Mental Status: She is alert and oriented to person, place, and time. Mental status is at baseline.     Cranial Nerves: No cranial nerve deficit.     Coordination: Coordination normal.  Psychiatric:        Mood and Affect: Mood normal.    LABORATORY DATA:  I have reviewed the data as listed Lab Results  Component Value Date   WBC 9.2 08/29/2021   HGB 8.9 (L) 08/29/2021   HCT 27.8 (L) 08/29/2021   MCV 98.9 08/29/2021   PLT 269 08/29/2021   Recent Labs    08/02/21 1106 08/02/21 1108 08/19/21 1100 08/22/21 1344 08/24/21 1414  NA  --    < > 138 137 139  K  --    < > 4.3 3.7 3.9  CL  --    < > 108 102 106  CO2  --    < > _0 GLUCOSE  --    < > 315* 231* 288*  BUN  --    < > 35* 44* 41*  CREATININE  --    < > 1.64* 1.94* 1.96*  CALCIUM  --    < > 8.4* 8.6* 8.6*  GFRNONAA  --    < > 35* 29* 28*  PROT 6.5   < > 5.8* 6.0* 5.9*  ALBUMIN 3.2*   < > 2.7* 3.0* 2.9*  AST 1,220*   < > 85* 92* 81*  ALT 777*   < > 148* 137* 118*  ALKPHOS  --    < > 369* 355* 342*  BILITOT 2.6*   < > 0.3 0.6 0.6  BILIDIR 1.9*  --   --   --   --   IBILI 0.7  --   --   --   --    < > = values in this interval not displayed.   Iron/TIBC/Ferritin/ %Sat    Component Value Date/Time   IRON 74 08/18/2021 0821   TIBC 400 08/18/2021 0821   FERRITIN 61 08/18/2021 0821   IRONPCTSAT 19 08/18/2021 0821      RADIOGRAPHIC STUDIES: I have personally reviewed the radiological images as listed and agreed with the findings in the report. US RENAL  Result Date:  08/24/2021 CLINICAL DATA:  Acute kidney injury. EXAM: RENAL / URINARY TRACT ULTRASOUND COMPLETE COMPARISON:  CT abdomen pelvis 11/17/2020. FINDINGS: Right Kidney: Renal measurements: 9.7 x 5.2 x 5.0 cm = volume: 132 mL. Increased in parenchymal echogenicity. No mass or hydronephrosis. Left Kidney: Renal measurements: 9.6 x 5.3 x 4.9 cm = volume: 128 mL. Increased in parenchymal echogenicity. No solid mass or hydronephrosis. Anechoic lesions with increased through transmission measure up to 1.4 x 1.2 x 1.2 cm. Bladder: Appears normal for degree of bladder distention. Other: None. IMPRESSION: 1. Increased renal parenchymal echogenicity is compatible with chronic medical renal disease. 2. Left renal cysts. Electronically Signed   By: Lorin Picket M.D.   On: 08/24/2021 16:50   US Venous Img Lower Bilateral  Result Date: 08/02/2021 CLINICAL DATA:  Shortness of breath EXAM: BILATERAL LOWER EXTREMITY VENOUS DOPPLER ULTRASOUND TECHNIQUE: Gray-scale sonography with graded compression, as well as color Doppler and duplex  ultrasound were performed to evaluate the lower extremity deep venous systems from the level of the common femoral vein and including the common femoral, femoral, profunda femoral, popliteal and calf veins including the posterior tibial, peroneal and gastrocnemius veins when visible. The superficial great saphenous vein was also interrogated. Spectral Doppler was utilized to evaluate flow at rest and with distal augmentation maneuvers in the common femoral, femoral and popliteal veins. COMPARISON:  None. FINDINGS: RIGHT LOWER EXTREMITY Common Femoral Vein: No evidence of thrombus. Normal compressibility, respiratory phasicity and response to augmentation. Saphenofemoral Junction: No evidence of thrombus. Normal compressibility and flow on color Doppler imaging. Profunda Femoral Vein: No evidence of thrombus. Normal compressibility and flow on color Doppler imaging. Femoral Vein: No evidence of thrombus.  Normal compressibility, respiratory phasicity and response to augmentation. Popliteal Vein: No evidence of thrombus. Normal compressibility, respiratory phasicity and response to augmentation. Calf Veins: Prior below-knee amputation. Superficial Great Saphenous Vein: No evidence of thrombus. Normal compressibility. Venous Reflux:  None. Other Findings:  None. LEFT LOWER EXTREMITY Common Femoral Vein: No evidence of thrombus. Normal compressibility, respiratory phasicity and response to augmentation. Saphenofemoral Junction: No evidence of thrombus. Normal compressibility and flow on color Doppler imaging. Profunda Femoral Vein: No evidence of thrombus. Normal compressibility and flow on color Doppler imaging. Femoral Vein: No evidence of thrombus. Normal compressibility, respiratory phasicity and response to augmentation. Popliteal Vein: No evidence of thrombus. Normal compressibility, respiratory phasicity and response to augmentation. Calf Veins: No evidence of thrombus. Normal compressibility and flow on color Doppler imaging. Superficial Great Saphenous Vein: No evidence of thrombus. Normal compressibility. Venous Reflux:  None. Other Findings:  None. IMPRESSION: No evidence of deep venous thrombosis in either lower extremity. Electronically Signed   By: Maurine Simmering M.D.   On: 08/02/2021 15:37   DG Chest Portable 1 View  Result Date: 08/02/2021 CLINICAL DATA:  A 66 year old female presents for evaluation of shortness of breath. EXAM: PORTABLE CHEST 1 VIEW COMPARISON:  April 26, 2021. FINDINGS: RIGHT IJ Port-A-Cath with tip over the distal superior vena cava. EKG leads project over the chest. LEFT hemidiaphragm remains elevated. Distortion of cardiomediastinal contours related to this hemidiaphragmatic elevation with similar appearance to prior imaging. Heart size and mediastinal contours are normal accounting for this finding. No lobar consolidation. No sign of effusion or pneumothorax. On limited  assessment there is no acute skeletal process. IMPRESSION: 1. Chronic elevation of the LEFT hemidiaphragm without acute cardiopulmonary disease. 2. RIGHT IJ Port-A-Cath terminating in the area of the distal superior vena cava. Electronically Signed   By: Zetta Bills M.D.   On: 08/02/2021 14:52   US Abdomen Limited RUQ (LIVER/GB)  Result Date: 08/03/2021 CLINICAL DATA:  Transaminitis EXAM: ULTRASOUND ABDOMEN LIMITED RIGHT UPPER QUADRANT COMPARISON:  07/28/2021 FINDINGS: Gallbladder: Status post cholecystectomy Common bile duct: Diameter: 3 mm Liver: No focal lesion identified. Within normal limits in parenchymal echogenicity. Portal vein is patent on color Doppler imaging with normal direction of blood flow towards the liver. Other: None. IMPRESSION: Normal sonographic appearance of the liver. Electronically Signed   By: Miachel Roux M.D.   On: 08/03/2021 08:37      ASSESSMENT & PLAN:  1. Non-small cell cancer of left lung (HCC)     Cancer Staging  Non-small cell lung cancer [facility]) Staging form: Lung, AJCC 8th Edition - Pathologic stage from 01/28/2021: Stage IIIA (pT1c, pN2, cM0) - Signed by Earlie Server, MD on 02/17/2021  Clinically she appears to be doing well.  She received 4 cycles  of adjuvant chemotherapy with carbo/Taxol.  Received 1 cycle of Tecentriq in January 2023 which was discontinued secondary to liver toxicity.   Liver toxicity-on prednisone 60 mg daily.  Decrease to 40 mg if liver function continues to trend down.  Otherwise maintain at 60 mg daily.  Liver function continues to trend down.  Reduce prednisone to 40 mg daily.  Anemia-slight improvement of hemoglobin.  8.9 today.  Weight loss-continue Marinol.  Prescription refilled.  AKI-due to poor oral intake.  Recommend 1 L normal saline today.  Recently had renal ultrasound which showed medical renal disease but no other abnormality.  Encouraged fluids.  Disposition- Fluids today. RTC as scheduled for follow-up with lab work,  MD assessment and possible IV fluids. Steroids reduced from 60 mg daily to 40 mg daily  I spent 25 minutes dedicated to the care of this patient (face-to-face and non-face-to-face) on the date of the encounter to include what is described in the assessment and plan.  All questions were answered. The patient knows to call the clinic with any problems questions or concerns.  cc [name], MD   Faythe Casa, NP 08/29/2021 10:14 AM

## 2021-08-31 ENCOUNTER — Other Ambulatory Visit: Payer: Self-pay | Admitting: Oncology

## 2021-09-01 ENCOUNTER — Encounter: Payer: Self-pay | Admitting: Oncology

## 2021-09-01 ENCOUNTER — Ambulatory Visit: Payer: Medicaid Other | Admitting: Oncology

## 2021-09-05 ENCOUNTER — Inpatient Hospital Stay: Payer: Medicaid Other

## 2021-09-05 ENCOUNTER — Telehealth: Payer: Self-pay | Admitting: *Deleted

## 2021-09-05 NOTE — Telephone Encounter (Signed)
Patient needs to reschedule appointment with Dr. [name]. ?

## 2021-09-06 ENCOUNTER — Encounter: Payer: Self-pay | Admitting: Oncology

## 2021-09-07 ENCOUNTER — Inpatient Hospital Stay: Payer: Medicaid Other

## 2021-09-07 ENCOUNTER — Inpatient Hospital Stay: Payer: Medicaid Other | Admitting: Hospice and Palliative Medicine

## 2021-09-12 ENCOUNTER — Inpatient Hospital Stay: Payer: Medicaid Other

## 2021-09-12 ENCOUNTER — Encounter: Payer: Self-pay | Admitting: Oncology

## 2021-09-12 ENCOUNTER — Other Ambulatory Visit: Payer: Self-pay

## 2021-09-12 ENCOUNTER — Inpatient Hospital Stay (HOSPITAL_BASED_OUTPATIENT_CLINIC_OR_DEPARTMENT_OTHER): Payer: Medicaid Other | Admitting: Oncology

## 2021-09-12 VITALS — BP 145/86 | HR 83 | Temp 97.8°F | Resp 18 | Wt 110.0 lb

## 2021-09-12 DIAGNOSIS — C3492 Malignant neoplasm of unspecified part of left bronchus or lung: Secondary | ICD-10-CM | POA: Diagnosis not present

## 2021-09-12 DIAGNOSIS — R739 Hyperglycemia, unspecified: Secondary | ICD-10-CM

## 2021-09-12 DIAGNOSIS — C3432 Malignant neoplasm of lower lobe, left bronchus or lung: Secondary | ICD-10-CM | POA: Diagnosis not present

## 2021-09-12 DIAGNOSIS — E440 Moderate protein-calorie malnutrition: Secondary | ICD-10-CM

## 2021-09-12 DIAGNOSIS — R7401 Elevation of levels of liver transaminase levels: Secondary | ICD-10-CM

## 2021-09-12 DIAGNOSIS — N1832 Chronic kidney disease, stage 3b: Secondary | ICD-10-CM

## 2021-09-12 LAB — CBC WITH DIFFERENTIAL/PLATELET
Abs Immature Granulocytes: 0.04 10*3/uL (ref 0.00–0.07)
Basophils Absolute: 0 10*3/uL (ref 0.0–0.1)
Basophils Relative: 0 %
Eosinophils Absolute: 0 10*3/uL (ref 0.0–0.5)
Eosinophils Relative: 0 %
HCT: 27.5 % — ABNORMAL LOW (ref 36.0–46.0)
Hemoglobin: 8.9 g/dL — ABNORMAL LOW (ref 12.0–15.0)
Immature Granulocytes: 1 %
Lymphocytes Relative: 8 %
Lymphs Abs: 0.6 10*3/uL — ABNORMAL LOW (ref 0.7–4.0)
MCH: 30.8 pg (ref 26.0–34.0)
MCHC: 32.4 g/dL (ref 30.0–36.0)
MCV: 95.2 fL (ref 80.0–100.0)
Monocytes Absolute: 0.4 10*3/uL (ref 0.1–1.0)
Monocytes Relative: 5 %
Neutro Abs: 6.3 10*3/uL (ref 1.7–7.7)
Neutrophils Relative %: 86 %
Platelets: 268 10*3/uL (ref 150–400)
RBC: 2.89 MIL/uL — ABNORMAL LOW (ref 3.87–5.11)
RDW: 14.2 % (ref 11.5–15.5)
WBC: 7.3 10*3/uL (ref 4.0–10.5)
nRBC: 0 % (ref 0.0–0.2)

## 2021-09-12 LAB — COMPREHENSIVE METABOLIC PANEL
ALT: 70 U/L — ABNORMAL HIGH (ref 0–44)
AST: 43 U/L — ABNORMAL HIGH (ref 15–41)
Albumin: 3 g/dL — ABNORMAL LOW (ref 3.5–5.0)
Alkaline Phosphatase: 238 U/L — ABNORMAL HIGH (ref 38–126)
Anion gap: 11 (ref 5–15)
BUN: 53 mg/dL — ABNORMAL HIGH (ref 8–23)
CO2: 22 mmol/L (ref 22–32)
Calcium: 8.3 mg/dL — ABNORMAL LOW (ref 8.9–10.3)
Chloride: 99 mmol/L (ref 98–111)
Creatinine, Ser: 2.01 mg/dL — ABNORMAL HIGH (ref 0.44–1.00)
GFR, Estimated: 27 mL/min — ABNORMAL LOW (ref >60)
Glucose, Bld: 428 mg/dL — ABNORMAL HIGH (ref 70–99)
Potassium: 4.3 mmol/L (ref 3.5–5.1)
Sodium: 132 mmol/L — ABNORMAL LOW (ref 135–145)
Total Bilirubin: 0.3 mg/dL (ref 0.3–1.2)
Total Protein: 5.8 g/dL — ABNORMAL LOW (ref 6.5–8.1)

## 2021-09-12 MED ORDER — DRONABINOL 2.5 MG PO CAPS
2.5000 mg | ORAL_CAPSULE | Freq: Two times a day (BID) | ORAL | 0 refills | Status: DC
Start: 2021-09-12 — End: 2021-10-31

## 2021-09-12 MED ORDER — PREDNISONE 10 MG PO TABS: 10.0000 mg | ORAL_TABLET | ORAL | 0 refills | Status: DC

## 2021-09-12 NOTE — Progress Notes (Signed)
No IV fluids today, per Dr. [name].  ?

## 2021-09-12 NOTE — Progress Notes (Signed)
?Hematology/Oncology Progress note ?Telephone:(336) [phone] [phone] ?  ? ? ? ?Patient Care Team: ?[name], MD as PCP - General (Family Medicine) ?Earlie Server, MD as Consulting Physician (Hematology and Oncology) ? ?REFERRING PROVIDER: ?[name], MD  ?CHIEF COMPLAINTS/REASON FOR VISIT:  ?Follow up for stage IIIA lung cancer ? ?HISTORY OF PRESENTING ILLNESS:  ?Patient was recently seen by urology Dr. Candiss Norse ski for evaluation of microscopic hematuria, frequent UTI. ?11/17/2020, CT hematuria work-up was obtained which showed no evidence of urinary tract calculus or hydronephrosis.  Small left renal cyst. ?Incidental findings of 1.9 x 1.2 cm left lower lobe pulmonary nodule worrisome for malignancy. ?Patient was referred to establish care with oncology for further evaluation ? ?Patient denies any shortness of breath, cough, hemoptysis, unintentional weight loss, fever or night sweats. ?She currently smokes 1 cigar/day.  She started smoking cigarettes at age of 62.  She quitted at age of 6 and restarted smoking cigar a few years ago. ? ?Patient has a history of depression and is on Cymbalta.  Diabetes, hyperlipidemia, hypertension, peripheral artery disease ?Per patient she also has history of heart attack in 2011. ?History of right lower extremity BKA in 2021, 02/15/2020 history of pulmonary embolism involving segmental and subsegmental branches of right upper, middle, lower lobe pulmonary arteries.  She was put on on apixaban. ?Patient lives with her aunt. ? ?12/07/2020, PET scan showed left lower lobe 1.3 x 1.8 cm lung nodule with SUV of 6.5.  Suspect early stage primary lung neoplasm.  No hilar or mediastinal lymphadenopathy activity. ? ?# 12/23/2020 S/p CT guided biopsy of left lower lobe mass.  ?Pathology showed non-small cell carcinoma, favor adenocarcinoma.  Positive for TTF-1, negative for p40.   ? ? ?# 01/17/2021, patient underwent left lower lobectomy with lymph node dissection. ?Pathology  showed invasive poorly differentiated adenocarcinoma, 2.2 cm.  Tumor abuts pleura but visceral pleural surface is not involved by carcinoma.  LVI invasion is present, resection margins are negative for carcinoma.  10 lymph nodes were harvested and 3 lymph nodes were involved with carcinoma. pT1c pN2 ?Patient's case was discussed at [facility] oncology tumor board.  And consensus recommendation was adjuvant chemotherapy and sent tissue for molecular/PD-L1 testing. ? ?Foundation one testing showed PD-L1 1,  ?No reportable alterations ?ERBB2 S310F ? ?# 02/28/2021 adjuvant carboplatin and Taxol ?07/29/2021 Medi port placed by IR.  ? ?INTERVAL HISTORY ?BECKETT HICKMON is a 66 y.o. female who has above history reviewed by me today presents for follow up visit for Stage IIIA lung cancer.  ?Patient reports feeling w ?Patient is currently on prednisone 40 mg daily.  she also takes Marinol 5 mg twice daily, instead of recommended dose of 2.5 mg twice daily.  She requests a refill of Marinol which helps her appetite. ?She has gained weight. ?Blood pressure stable. ?Denies any nausea vomiting diarrhea. ?Patient is on Lantus 20 units at bedtime and sliding scale. ? ? ? ?Review of Systems  ?Constitutional:  Positive for appetite change and fatigue. Negative for chills and fever.  ?HENT:   Negative for hearing loss and voice change.   ?Eyes:  Negative for eye problems.  ?Respiratory:  Negative for chest tightness and cough.   ?Cardiovascular:  Negative for chest pain.  ?Gastrointestinal:  Negative for abdominal distention, abdominal pain, blood in stool, diarrhea and nausea.  ?Endocrine: Negative for hot flashes.  ?Genitourinary:  Negative for difficulty urinating, dysuria and frequency.   ?Musculoskeletal:  Negative for arthralgias.  ?Skin:  Negative for  itching and rash.  ?Neurological:  Negative for extremity weakness.  ?Hematological:  Negative for adenopathy.  ?Psychiatric/Behavioral:  Negative for confusion.   ? ?MEDICAL  HISTORY:  ?Past Medical History:  ?Diagnosis Date  ? Anemia   ? Cancer [facility])   ? CHF (congestive heart failure) ([place])   ? 02/16/20 HFrEF 20-25% in setting of acute DVT/PE, underlying CAD  ? CKD (chronic kidney disease)   ? Coronary artery disease   ? 02/20/20: CTO pRCA with left-to-right collaterals, 50% mLAD, 80% OM1, medical therapy  ? Depression   ? Diabetes ([place])   ? Diabetes mellitus without complication ([place])   ? DVT (deep venous thrombosis) ([place]) 02/16/2020  ? RLE DVT proximal veins 02/16/20 Duplex  ? Hyperlipidemia   ? Hypertension   ? Myocardial infarction [facility]) 2009  ? Stress related per patient; NSTEMI 2012 100% RCA with left-to-right collaterals  ? PE (pulmonary thromboembolism) ([place]) 02/15/2020  ? 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, RLE BKA  ? Peripheral artery disease ([place])   ? ? ?SURGICAL HISTORY: ?Past Surgical History:  ?Procedure Laterality Date  ? IR IMAGING GUIDED PORT INSERTION  07/27/2021  ? Right lower extremity BKA Right   ? ? ?SOCIAL HISTORY: ?Social History  ? ?Socioeconomic History  ? Marital status: Married  ?  Spouse name: Not on file  ? Number of children: Not on file  ? Years of education: Not on file  ? Highest education level: Not on file  ?Occupational History  ? Not on file  ?Tobacco Use  ? Smoking status: Former  ?  Packs/day: 0.25  ?  Types: Cigars, Cigarettes  ?  Quit date: 01/17/2021  ?  Years since quitting: 0.6  ? Smokeless tobacco: Never  ?Vaping Use  ? Vaping Use: Never used  ?Substance and Sexual Activity  ? Alcohol use: Never  ? Drug use: Not Currently  ? Sexual activity: Not Currently  ?Other Topics Concern  ? Not on file  ?Social History Narrative  ? Not on file  ? ?Social Determinants of Health  ? ?Financial Resource Strain: Not on file  ?Food Insecurity: Not on file  ?Transportation Needs: Not on file  ?Physical Activity: Not on file  ?Stress: Not on file  ?Social Connections: Not on file  ?Intimate Partner Violence: Not on file  ? ? ?FAMILY HISTORY: ?Family  History  ?Problem Relation Age of Onset  ? Heart disease Mother   ? Diabetes Mother   ? Heart disease Father   ? Diabetes Father   ? ? ?ALLERGIES:  is allergic to lisinopril. ? ?MEDICATIONS:  ?Current Outpatient Medications  ?Medication Sig Dispense Refill  ? ACCU-CHEK GUIDE test strip USE UP TP 4 TIMES A DAY AS DIRECTED 100 strip 1  ? Accu-Chek Softclix Lancets lancets SMARTSIG:Topical 1-4 Times Daily    ? aspirin EC 81 MG tablet Take 81 mg by mouth in the morning. Swallow whole.    ? blood glucose meter kit and supplies KIT Dispense based on patient and insurance preference. Use up to four times daily as directed. 1 each 0  ? carboxymethylcellulose (REFRESH PLUS) 0.5 % SOLN Place 1 drop into both eyes in the morning and at bedtime.    ? diphenhydrAMINE (BENADRYL) 25 MG tablet Take 25 mg by mouth at bedtime.    ? docusate sodium (COLACE) 100 MG capsule Take 1 capsule (100 mg total) by mouth 2 (two) times daily. 60 capsule 1  ? DULoxetine (CYMBALTA) 20 MG capsule SMARTSIG:2  Capsule(s) By Mouth Morning-Night    ? empagliflozin (JARDIANCE) 10 MG TABS tablet Take 1 tablet (10 mg total) by mouth daily. 90 tablet 3  ? HUMALOG KWIKPEN 100 UNIT/ML KwikPen Inject 8 Units into the skin 3 (three) times daily after meals. 15 mL 11  ? isosorbide dinitrate (ISORDIL) 30 MG tablet Take 1 tablet (30 mg total) by mouth 3 (three) times daily. 270 tablet 1  ? ketoconazole (NIZORAL) 2 % cream APPLY 1 APPLICATION TOPICALLY IN THE MORNING AND AT BEDTIME. APPLIED TO FEET 60 g 1  ? LANTUS SOLOSTAR 100 UNIT/ML Solostar Pen Inject 20 Units into the skin at bedtime. 15 mL 0  ? lidocaine-prilocaine (EMLA) cream Apply 1 application topically as needed. Apply to port and cover with saran wrap 1-2 hours prior to port access 30 g 1  ? metFORMIN (GLUCOPHAGE-XR) 500 MG 24 hr tablet TAKE 1 TABLET BY MOUTH TWICE A DAY 180 tablet 2  ? metoprolol succinate (TOPROL-XL) 50 MG 24 hr tablet Take 1 tablet (50 mg total) by mouth daily. Take with or  immediately following a meal. 90 tablet 1  ? Multiple Vitamin (MULTIVITAMIN WITH MINERALS) TABS tablet Take 1 tablet by mouth in the morning. Adults 50+    ? ondansetron (ZOFRAN) 8 MG tablet Take 1 tablet (8 mg total) by

## 2021-09-12 NOTE — Progress Notes (Signed)
Pt here for follow up. No new concerns voiced. She is requesting refill on dronabinol.  ? ?

## 2021-09-27 ENCOUNTER — Ambulatory Visit
Admission: RE | Admit: 2021-09-27 | Discharge: 2021-09-27 | Disposition: A | Discharge status: Home / Self Care | Payer: Medicaid Other | Source: Ambulatory Visit | Attending: Oncology | Admitting: Oncology

## 2021-09-27 ENCOUNTER — Other Ambulatory Visit: Payer: Self-pay

## 2021-09-27 DIAGNOSIS — C3492 Malignant neoplasm of unspecified part of left bronchus or lung: Secondary | ICD-10-CM | POA: Insufficient documentation

## 2021-09-27 DIAGNOSIS — C3490 Malignant neoplasm of unspecified part of unspecified bronchus or lung: Secondary | ICD-10-CM | POA: Diagnosis not present

## 2021-09-27 DIAGNOSIS — I700 Atherosclerosis of aorta: Secondary | ICD-10-CM | POA: Diagnosis not present

## 2021-09-30 ENCOUNTER — Other Ambulatory Visit: Payer: Self-pay | Admitting: Emergency Medicine

## 2021-09-30 DIAGNOSIS — C3492 Malignant neoplasm of unspecified part of left bronchus or lung: Secondary | ICD-10-CM

## 2021-10-03 ENCOUNTER — Encounter: Payer: Self-pay | Admitting: Oncology

## 2021-10-03 ENCOUNTER — Inpatient Hospital Stay (HOSPITAL_BASED_OUTPATIENT_CLINIC_OR_DEPARTMENT_OTHER): Payer: Medicaid Other | Admitting: Oncology

## 2021-10-03 ENCOUNTER — Inpatient Hospital Stay: Payer: Medicaid Other

## 2021-10-03 ENCOUNTER — Inpatient Hospital Stay: Payer: Medicaid Other | Attending: Oncology

## 2021-10-03 VITALS — BP 92/60 | HR 80

## 2021-10-03 VITALS — BP 87/55 | HR 97 | Temp 97.9°F | Resp 16 | Ht 65.0 in | Wt 105.8 lb

## 2021-10-03 DIAGNOSIS — R7989 Other specified abnormal findings of blood chemistry: Secondary | ICD-10-CM | POA: Diagnosis not present

## 2021-10-03 DIAGNOSIS — Z87891 Personal history of nicotine dependence: Secondary | ICD-10-CM | POA: Insufficient documentation

## 2021-10-03 DIAGNOSIS — Z9289 Personal history of other medical treatment: Secondary | ICD-10-CM

## 2021-10-03 DIAGNOSIS — N281 Cyst of kidney, acquired: Secondary | ICD-10-CM | POA: Diagnosis not present

## 2021-10-03 DIAGNOSIS — R5383 Other fatigue: Secondary | ICD-10-CM | POA: Insufficient documentation

## 2021-10-03 DIAGNOSIS — I509 Heart failure, unspecified: Secondary | ICD-10-CM | POA: Diagnosis not present

## 2021-10-03 DIAGNOSIS — Z79899 Other long term (current) drug therapy: Secondary | ICD-10-CM | POA: Insufficient documentation

## 2021-10-03 DIAGNOSIS — C3492 Malignant neoplasm of unspecified part of left bronchus or lung: Secondary | ICD-10-CM

## 2021-10-03 DIAGNOSIS — I739 Peripheral vascular disease, unspecified: Secondary | ICD-10-CM | POA: Diagnosis not present

## 2021-10-03 DIAGNOSIS — E119 Type 2 diabetes mellitus without complications: Secondary | ICD-10-CM | POA: Diagnosis not present

## 2021-10-03 DIAGNOSIS — E785 Hyperlipidemia, unspecified: Secondary | ICD-10-CM | POA: Diagnosis not present

## 2021-10-03 DIAGNOSIS — I9589 Other hypotension: Secondary | ICD-10-CM

## 2021-10-03 DIAGNOSIS — R42 Dizziness and giddiness: Secondary | ICD-10-CM | POA: Diagnosis not present

## 2021-10-03 DIAGNOSIS — Z833 Family history of diabetes mellitus: Secondary | ICD-10-CM | POA: Diagnosis not present

## 2021-10-03 DIAGNOSIS — Z95828 Presence of other vascular implants and grafts: Secondary | ICD-10-CM | POA: Diagnosis not present

## 2021-10-03 DIAGNOSIS — Z8249 Family history of ischemic heart disease and other diseases of the circulatory system: Secondary | ICD-10-CM | POA: Diagnosis not present

## 2021-10-03 DIAGNOSIS — I10 Essential (primary) hypertension: Secondary | ICD-10-CM | POA: Diagnosis not present

## 2021-10-03 DIAGNOSIS — Z5111 Encounter for antineoplastic chemotherapy: Secondary | ICD-10-CM | POA: Diagnosis present

## 2021-10-03 DIAGNOSIS — C3432 Malignant neoplasm of lower lobe, left bronchus or lung: Secondary | ICD-10-CM | POA: Diagnosis present

## 2021-10-03 LAB — CBC WITH DIFFERENTIAL/PLATELET
Abs Immature Granulocytes: 0.03 10*3/uL (ref 0.00–0.07)
Basophils Absolute: 0 10*3/uL (ref 0.0–0.1)
Basophils Relative: 0 %
Eosinophils Absolute: 0 10*3/uL (ref 0.0–0.5)
Eosinophils Relative: 0 %
HCT: 31.7 % — ABNORMAL LOW (ref 36.0–46.0)
Hemoglobin: 9.8 g/dL — ABNORMAL LOW (ref 12.0–15.0)
Immature Granulocytes: 0 %
Lymphocytes Relative: 14 %
Lymphs Abs: 1 10*3/uL (ref 0.7–4.0)
MCH: 29.8 pg (ref 26.0–34.0)
MCHC: 30.9 g/dL (ref 30.0–36.0)
MCV: 96.4 fL (ref 80.0–100.0)
Monocytes Absolute: 0.5 10*3/uL (ref 0.1–1.0)
Monocytes Relative: 7 %
Neutro Abs: 5.7 10*3/uL (ref 1.7–7.7)
Neutrophils Relative %: 79 %
Platelets: 332 10*3/uL (ref 150–400)
RBC: 3.29 MIL/uL — ABNORMAL LOW (ref 3.87–5.11)
RDW: 14.8 % (ref 11.5–15.5)
WBC: 7.3 10*3/uL (ref 4.0–10.5)
nRBC: 0 % (ref 0.0–0.2)

## 2021-10-03 LAB — COMPREHENSIVE METABOLIC PANEL
ALT: 63 U/L — ABNORMAL HIGH (ref 0–44)
AST: 73 U/L — ABNORMAL HIGH (ref 15–41)
Albumin: 2.9 g/dL — ABNORMAL LOW (ref 3.5–5.0)
Alkaline Phosphatase: 243 U/L — ABNORMAL HIGH (ref 38–126)
Anion gap: 6 (ref 5–15)
BUN: 34 mg/dL — ABNORMAL HIGH (ref 8–23)
CO2: 21 mmol/L — ABNORMAL LOW (ref 22–32)
Calcium: 8.3 mg/dL — ABNORMAL LOW (ref 8.9–10.3)
Chloride: 108 mmol/L (ref 98–111)
Creatinine, Ser: 1.81 mg/dL — ABNORMAL HIGH (ref 0.44–1.00)
GFR, Estimated: 31 mL/min — ABNORMAL LOW (ref >60)
Glucose, Bld: 210 mg/dL — ABNORMAL HIGH (ref 70–99)
Potassium: 4.7 mmol/L (ref 3.5–5.1)
Sodium: 135 mmol/L (ref 135–145)
Total Bilirubin: 0.5 mg/dL (ref 0.3–1.2)
Total Protein: 5.9 g/dL — ABNORMAL LOW (ref 6.5–8.1)

## 2021-10-03 MED ORDER — PREDNISONE 10 MG PO TABS
10.0000 mg | ORAL_TABLET | Freq: Every day | ORAL | 0 refills | Status: DC
Start: 2021-10-03 — End: 2021-10-31

## 2021-10-03 MED ORDER — PREDNISONE 10 MG PO TABS: 10.0000 mg | ORAL_TABLET | ORAL | 0 refills | Status: DC

## 2021-10-03 MED ORDER — SODIUM CHLORIDE 0.9 % IV SOLN
Freq: Once | INTRAVENOUS | Status: AC
  Filled 2021-10-03: qty 250

## 2021-10-03 MED ORDER — HEPARIN SOD (PORK) LOCK FLUSH 100 UNIT/ML IV SOLN
500.0000 [IU] | Freq: Once | INTRAVENOUS | Status: AC
  Administered 2021-10-03: 500 [IU] via INTRAVENOUS
  Filled 2021-10-03: qty 5

## 2021-10-03 MED ORDER — SODIUM CHLORIDE 0.9% FLUSH
10.0000 mL | Freq: Once | INTRAVENOUS | Status: AC
  Administered 2021-10-03: 10 mL via INTRAVENOUS
  Filled 2021-10-03: qty 10

## 2021-10-03 NOTE — Patient Instructions (Signed)

## 2021-10-03 NOTE — Progress Notes (Signed)
Patient tolerated 1L IVF  infusion well today, no concerns voiced. Patient stable at discharge. AVS given.   ?

## 2021-10-03 NOTE — Progress Notes (Signed)
Pt bp 87/55. Pt reports nausea relieved by meds and rest. Pt also c/o decreased appetite and weight loss of approx. 5 lbs in 3 weeks.  ?

## 2021-10-03 NOTE — Progress Notes (Signed)
?Hematology/Oncology Progress note ?Telephone:(336) [phone] [phone] ?  ? ? ? ?Patient Care Team: ?[name], MD as PCP - General (Family Medicine) ?Earlie Server, MD as Consulting Physician (Hematology and Oncology) ? ?REFERRING PROVIDER: ?[name], MD  ?CHIEF COMPLAINTS/REASON FOR VISIT:  ?Follow up for stage IIIA lung cancer ? ?HISTORY OF PRESENTING ILLNESS:  ?Patient was recently seen by urology Dr. Candiss Norse ski for evaluation of microscopic hematuria, frequent UTI. ?11/17/2020, CT hematuria work-up was obtained which showed no evidence of urinary tract calculus or hydronephrosis.  Small left renal cyst. ?Incidental findings of 1.9 x 1.2 cm left lower lobe pulmonary nodule worrisome for malignancy. ?Patient was referred to establish care with oncology for further evaluation ? ?Patient denies any shortness of breath, cough, hemoptysis, unintentional weight loss, fever or night sweats. ?She currently smokes 1 cigar/day.  She started smoking cigarettes at age of 53.  She quitted at age of 7 and restarted smoking cigar a few years ago. ? ?Patient has a history of depression and is on Cymbalta.  Diabetes, hyperlipidemia, hypertension, peripheral artery disease ?Per patient she also has history of heart attack in 2011. ?History of right lower extremity BKA in 2021, 02/15/2020 history of pulmonary embolism involving segmental and subsegmental branches of right upper, middle, lower lobe pulmonary arteries.  She was put on on apixaban. ?Patient lives with her aunt. ? ?12/07/2020, PET scan showed left lower lobe 1.3 x 1.8 cm lung nodule with SUV of 6.5.  Suspect early stage primary lung neoplasm.  No hilar or mediastinal lymphadenopathy activity. ? ?# 12/23/2020 S/p CT guided biopsy of left lower lobe mass.  ?Pathology showed non-small cell carcinoma, favor adenocarcinoma.  Positive for TTF-1, negative for p40.   ? ? ?# 01/17/2021, patient underwent left lower lobectomy with lymph node dissection. ?Pathology  showed invasive poorly differentiated adenocarcinoma, 2.2 cm.  Tumor abuts pleura but visceral pleural surface is not involved by carcinoma.  LVI invasion is present, resection margins are negative for carcinoma.  10 lymph nodes were harvested and 3 lymph nodes were involved with carcinoma. pT1c pN2 ?Patient's case was discussed at [facility] oncology tumor board.  And consensus recommendation was adjuvant chemotherapy and sent tissue for molecular/PD-L1 testing. ? ?Foundation one testing showed PD-L1 1,  ?No reportable alterations ?ERBB2 S310F ? ?# 02/28/2021 adjuvant carboplatin and Taxol ?07/29/2021 Medi port placed by IR.  ? ?INTERVAL HISTORY ?[name] [name] is a 66 y.o. female who has above history reviewed by me today presents for follow up visit for Stage IIIA lung cancer.  ?With some lightheadedness.  BP was low in the clinic.  She took her BP meds this morning including metoprolol, Imdur, spironolactone. ?She has lost some weight ?Patient is on steroid tapering course.  Currently on prednisone 10 mg daily.  She has been on this dose for the past 2 weeks. ?No nausea vomiting diarrhea.  She has mild lightheadedness. ? ? ?Review of Systems  ?Constitutional:  Positive for fatigue and unexpected weight change. Negative for chills and fever.  ?HENT:   Negative for hearing loss and voice change.   ?Eyes:  Negative for eye problems.  ?Respiratory:  Negative for chest tightness and cough.   ?Cardiovascular:  Negative for chest pain.  ?Gastrointestinal:  Negative for abdominal distention, abdominal pain, blood in stool, diarrhea and nausea.  ?Endocrine: Negative for hot flashes.  ?Genitourinary:  Negative for difficulty urinating, dysuria and frequency.   ?Musculoskeletal:  Negative for arthralgias.  ?Skin:  Negative for itching and rash.  ?  Neurological:  Positive for light-headedness. Negative for extremity weakness.  ?Hematological:  Negative for adenopathy.  ?Psychiatric/Behavioral:  Negative for confusion.    ? ?MEDICAL HISTORY:  ?Past Medical History:  ?Diagnosis Date  ? Anemia   ? Cancer [facility])   ? CHF (congestive heart failure) ([place])   ? 02/16/20 HFrEF 20-25% in setting of acute DVT/PE, underlying CAD  ? CKD (chronic kidney disease)   ? Coronary artery disease   ? 02/20/20: CTO pRCA with left-to-right collaterals, 50% mLAD, 80% OM1, medical therapy  ? Depression   ? Diabetes ([place])   ? Diabetes mellitus without complication ([place])   ? DVT (deep venous thrombosis) ([place]) 02/16/2020  ? RLE DVT proximal veins 02/16/20 Duplex  ? Hyperlipidemia   ? Hypertension   ? Myocardial infarction [facility]) 2009  ? Stress related per patient; NSTEMI 2012 100% RCA with left-to-right collaterals  ? PE (pulmonary thromboembolism) ([place]) 02/15/2020  ? 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, RLE BKA  ? Peripheral artery disease ([place])   ? ? ?SURGICAL HISTORY: ?Past Surgical History:  ?Procedure Laterality Date  ? IR IMAGING GUIDED PORT INSERTION  07/27/2021  ? Right lower extremity BKA Right   ? ? ?SOCIAL HISTORY: ?Social History  ? ?Socioeconomic History  ? Marital status: Married  ?  Spouse name: Not on file  ? Number of children: Not on file  ? Years of education: Not on file  ? Highest education level: Not on file  ?Occupational History  ? Not on file  ?Tobacco Use  ? Smoking status: Former  ?  Packs/day: 0.25  ?  Types: Cigars, Cigarettes  ?  Quit date: 01/17/2021  ?  Years since quitting: 0.7  ? Smokeless tobacco: Never  ?Vaping Use  ? Vaping Use: Never used  ?Substance and Sexual Activity  ? Alcohol use: Never  ? Drug use: Not Currently  ? Sexual activity: Not Currently  ?Other Topics Concern  ? Not on file  ?Social History Narrative  ? Not on file  ? ?Social Determinants of Health  ? ?Financial Resource Strain: Not on file  ?Food Insecurity: Not on file  ?Transportation Needs: Not on file  ?Physical Activity: Not on file  ?Stress: Not on file  ?Social Connections: Not on file  ?Intimate Partner Violence: Not on file  ? ? ?FAMILY  HISTORY: ?Family History  ?Problem Relation Age of Onset  ? Heart disease Mother   ? Diabetes Mother   ? Heart disease Father   ? Diabetes Father   ? ? ?ALLERGIES:  is allergic to lisinopril. ? ?MEDICATIONS:  ?Current Outpatient Medications  ?Medication Sig Dispense Refill  ? ACCU-CHEK GUIDE test strip USE UP TP 4 TIMES A DAY AS DIRECTED 100 strip 1  ? Accu-Chek Softclix Lancets lancets SMARTSIG:Topical 1-4 Times Daily    ? aspirin EC 81 MG tablet Take 81 mg by mouth in the morning. Swallow whole.    ? blood glucose meter kit and supplies KIT Dispense based on patient and insurance preference. Use up to four times daily as directed. 1 each 0  ? carboxymethylcellulose (REFRESH PLUS) 0.5 % SOLN Place 1 drop into both eyes in the morning and at bedtime.    ? diphenhydrAMINE (BENADRYL) 25 MG tablet Take 25 mg by mouth at bedtime.    ? docusate sodium (COLACE) 100 MG capsule Take 1 capsule (100 mg total) by mouth 2 (two) times daily. 60 capsule 1  ? dronabinol (MARINOL) 2.5 MG capsule Take 1  capsule (2.5 mg total) by mouth 2 (two) times daily before lunch and supper. 60 capsule 0  ? DULoxetine (CYMBALTA) 20 MG capsule SMARTSIG:2 Capsule(s) By Mouth Morning-Night    ? empagliflozin (JARDIANCE) 10 MG TABS tablet Take 1 tablet (10 mg total) by mouth daily. 90 tablet 3  ? HUMALOG KWIKPEN 100 UNIT/ML KwikPen Inject 8 Units into the skin 3 (three) times daily after meals. 15 mL 11  ? isosorbide dinitrate (ISORDIL) 30 MG tablet Take 1 tablet (30 mg total) by mouth 3 (three) times daily. 270 tablet 1  ? ketoconazole (NIZORAL) 2 % cream APPLY 1 APPLICATION TOPICALLY IN THE MORNING AND AT BEDTIME. APPLIED TO FEET 60 g 1  ? LANTUS SOLOSTAR 100 UNIT/ML Solostar Pen Inject 20 Units into the skin at bedtime. 15 mL 0  ? lidocaine-prilocaine (EMLA) cream Apply 1 application topically as needed. Apply to port and cover with saran wrap 1-2 hours prior to port access 30 g 1  ? metFORMIN (GLUCOPHAGE-XR) 500 MG 24 hr tablet TAKE 1 TABLET BY  MOUTH TWICE A DAY 180 tablet 2  ? metoprolol succinate (TOPROL-XL) 50 MG 24 hr tablet Take 1 tablet (50 mg total) by mouth daily. Take with or immediately following a meal. 90 tablet 1  ? Multiple Vitamin (MULTIVITAMI

## 2021-10-10 ENCOUNTER — Inpatient Hospital Stay: Payer: Medicaid Other

## 2021-10-10 DIAGNOSIS — C3432 Malignant neoplasm of lower lobe, left bronchus or lung: Secondary | ICD-10-CM | POA: Diagnosis not present

## 2021-10-10 DIAGNOSIS — C3492 Malignant neoplasm of unspecified part of left bronchus or lung: Secondary | ICD-10-CM

## 2021-10-10 LAB — HEPATIC FUNCTION PANEL
ALT: 47 U/L — ABNORMAL HIGH (ref 0–44)
AST: 42 U/L — ABNORMAL HIGH (ref 15–41)
Albumin: 3 g/dL — ABNORMAL LOW (ref 3.5–5.0)
Alkaline Phosphatase: 168 U/L — ABNORMAL HIGH (ref 38–126)
Bilirubin, Direct: <0.1 mg/dL (ref 0.0–0.2)
Total Bilirubin: 0.3 mg/dL (ref 0.3–1.2)
Total Protein: 5.8 g/dL — ABNORMAL LOW (ref 6.5–8.1)

## 2021-10-11 ENCOUNTER — Telehealth: Payer: Self-pay

## 2021-10-11 NOTE — Telephone Encounter (Signed)
Nutrition ? ?Messaged received that patient wanted another flavor of glucerna shakes.  Called patient this am.  No answer.  Left message saying that she needs to call [employer] (supplying shakes) at [phone] and request another flavor.  Also left RD's callback number in case patient has questions.  ? ?Yamilka Lopiccolo B. Zenia Resides, RD, LDN ?Registered Dietitian ?[phone] ? ?

## 2021-10-12 ENCOUNTER — Inpatient Hospital Stay: Payer: Medicaid Other

## 2021-10-12 NOTE — Progress Notes (Signed)
Nutrition Follow-up: ? ?Patient with lung cancer.  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  Currently on surveillance.   ? ?Called and spoke with patient via phone.  Patient wanting to know how to change up flavor of glucerna shakes.  Currently getting chocolate from [employer].  Says that appetite is not that good.  Says that she is having issues with nausea but takes the medication.  Denies constipation or diarrhea.  Says that she has stomach pain sometimes when she eats.  Eating mostly potatoes and chicken noodle soup.  Says that food taste normal.  Drinking glucerna shakes as well.  ? ? ? ?Medications: reviewed, on prednisone ? ?Labs: reviewed ? ?Anthropometrics:  ? ?Weight 105 lb 12.8 oz on 4/17 ? ?110 lb on 3/27 ?109 lb on 3/13 ?104 lb on 3/9 ?116 lb 2/27 ?114 lb on 2/8 ? ? ?NUTRITION DIAGNOSIS: Inadequate oral intake continues ? ? ? ?INTERVENTION:  ?Provided patient with phone number to [place] to call and re-order shakes and change flavor.   ?Encouraged patient to call cancer center if abdominal pain continues or gets worse.  ?Continue nausea medication ?May need to consider trial of appetite stimulant ? ?  ? ?MONITORING, EVALUATION, GOAL: weight trends, intake ? ? ?NEXT VISIT: Wednesday, May 17 phone call ? ?Tenea Sens B. Zenia Resides, RD, LDN ?Registered Dietitian ?[phone] ? ? ?

## 2021-10-13 ENCOUNTER — Other Ambulatory Visit: Payer: Self-pay | Admitting: *Deleted

## 2021-10-13 NOTE — Patient Outreach (Signed)
Care Coordination ? ?10/13/2021 ? ?[name] ?06/01/1956 ?[id number] ? ? ?Medicaid Managed Care  ? ?Unsuccessful Outreach Note ? ?10/13/2021 ?Name: [name] [name] MRN: [id number] DOB: 12/05/1955 ? ?Referred by: [name], MD ?Reason for referral : High Risk Managed Medicaid (Unsuccessful RNCM initial outreach) ? ? ?An unsuccessful telephone outreach was attempted today. The patient was referred to the case management team for assistance with care management and care coordination.  ? ?Follow Up Plan: A HIPAA compliant phone message was left for the patient providing contact information and requesting a return call.  ? ?Lurena Joiner RN, BSN ?[place] ?RN Care Coordinator ? ? ?

## 2021-10-13 NOTE — Patient Instructions (Signed)
Visit Information ? ?Ms. [name]  - as a part of your Medicaid benefit, you are eligible for care management and care coordination services at no cost or copay. I was unable to reach you by phone today but would be happy to help you with your health related needs. Please feel free to call me @ [phone].  ? ?A member of the Managed Medicaid care management team will reach out to you again over the next 14 days.  ? ?Lurena Joiner RN, BSN ?[place] ?RN Care Coordinator ?  ?

## 2021-10-18 ENCOUNTER — Telehealth: Payer: Self-pay | Admitting: Family Medicine

## 2021-10-18 ENCOUNTER — Telehealth: Payer: Medicaid Other

## 2021-10-18 NOTE — Telephone Encounter (Signed)
.. ?  Medicaid Managed Care  ? ?Unsuccessful Outreach Note ? ?10/18/2021 ?Name: [name] [name] MRN: [id number] DOB: July 25, 1955 ? ?Referred by: [name], MD ?Reason for referral : High Risk Managed Medicaid (I called the patient today to get her rescheduled for a visit with the MM RNCM. I left my name and number on her VM.) ? ? ?An unsuccessful telephone outreach was attempted today. The patient was referred to the case management team for assistance with care management and care coordination.  ? ?Follow Up Plan: The care management team will reach out to the patient again over the next 7 days.  ? ?Reita Chard ?Care Guide, High Risk Medicaid Managed Care ?Embedded Care Coordination ?[place]  ? ? ?SIGNATURE  ?

## 2021-10-19 ENCOUNTER — Other Ambulatory Visit: Payer: Self-pay | Admitting: Family Medicine

## 2021-10-19 ENCOUNTER — Other Ambulatory Visit: Payer: Self-pay | Admitting: Oncology

## 2021-10-19 DIAGNOSIS — C3492 Malignant neoplasm of unspecified part of left bronchus or lung: Secondary | ICD-10-CM

## 2021-10-19 DIAGNOSIS — I25118 Atherosclerotic heart disease of native coronary artery with other forms of angina pectoris: Secondary | ICD-10-CM

## 2021-10-19 DIAGNOSIS — I150 Renovascular hypertension: Secondary | ICD-10-CM | POA: Diagnosis not present

## 2021-10-19 DIAGNOSIS — N181 Chronic kidney disease, stage 1: Secondary | ICD-10-CM | POA: Diagnosis not present

## 2021-10-19 DIAGNOSIS — R809 Proteinuria, unspecified: Secondary | ICD-10-CM | POA: Diagnosis not present

## 2021-10-19 DIAGNOSIS — Z681 Body mass index (BMI) 19 or less, adult: Secondary | ICD-10-CM | POA: Diagnosis not present

## 2021-10-19 DIAGNOSIS — I10 Essential (primary) hypertension: Secondary | ICD-10-CM | POA: Diagnosis not present

## 2021-10-19 DIAGNOSIS — N179 Acute kidney failure, unspecified: Secondary | ICD-10-CM | POA: Diagnosis not present

## 2021-10-19 DIAGNOSIS — E8809 Other disorders of plasma-protein metabolism, not elsewhere classified: Secondary | ICD-10-CM | POA: Diagnosis not present

## 2021-10-19 NOTE — Telephone Encounter (Signed)
Requested Prescriptions  ?Pending Prescriptions Disp Refills  ?? ACCU-CHEK GUIDE test strip [Pharmacy Med Name: ACCU-CHEK GUIDE TEST STRIP] 100 strip 1  ?  Sig: USE UP TO 4 TIMES A DAY AS DIRECTED  ?  ? Endocrinology: Diabetes - Testing Supplies Passed - 10/19/2021 12:03 PM  ?  ?  Passed - Valid encounter within last 12 months  ?  Recent Outpatient Visits   ?      ? 1 month ago Primary hypertension  ? [facility] [name], MD  ? 4 months ago Primary hypertension  ? [facility] [name], MD  ? 8 months ago Chronic pain due to neoplasm  ? [facility] [name], MD  ? 10 months ago Type 2 diabetes mellitus with diabetic peripheral angiopathy without gangrene, with long-term current use of insulin ([place])  ? [facility] [name], MD  ? 1 year ago Type 2 diabetes mellitus with diabetic peripheral angiopathy without gangrene, with long-term current use of insulin ([place])  ? [facility] [name], MD  ?  ?  ?Future Appointments   ?        ? In 7 months [name], MD [facility]  ?  ? ?  ?  ?  ?? LANTUS SOLOSTAR 100 UNIT/ML Solostar Pen [Pharmacy Med Name: LANTUS SOLOSTAR 100 UNIT/ML] 15 mL 0  ?  Sig: PLEASE INJECT 16 UNITS UNDER THE SKIN EACH NIGHT  ?  ? Endocrinology:  Diabetes - Insulins Passed - 10/19/2021 12:03 PM  ?  ?  Passed - HBA1C is between 0 and 7.9 and within 180 days  ?  Hemoglobin A1C  ?Date Value Ref Range Status  ?01/25/2021 7.8  Final  ? ?Hgb A1c MFr Bld  ?Date Value Ref Range Status  ?08/25/2021 7.6 (H) 4.8 - 5.6 % Final  ?  Comment:  ?           Prediabetes: 5.7 - 6.4 ?         Diabetes: >6.4 ?         Glycemic control for adults with diabetes: <7.0 ?  ?   ?  ?  Passed - Valid encounter within last 6 months  ?  Recent Outpatient Visits   ?      ? 1 month ago Primary hypertension  ? [facility] [name], MD  ? 4 months ago Primary hypertension  ? [facility] [name], MD  ? 8  months ago Chronic pain due to neoplasm  ? [facility] [name], MD  ? 10 months ago Type 2 diabetes mellitus with diabetic peripheral angiopathy without gangrene, with long-term current use of insulin ([place])  ? [facility] [name], MD  ? 1 year ago Type 2 diabetes mellitus with diabetic peripheral angiopathy without gangrene, with long-term current use of insulin ([place])  ? [facility] [name], MD  ?  ?  ?Future Appointments   ?        ? In 7 months [name], MD [facility]  ?  ? ?  ?  ?  ?? furosemide (LASIX) 40 MG tablet [Pharmacy Med Name: FUROSEMIDE 40 MG TABLET] 90 tablet 1  ?  Sig: TAKE 1 TABLET BY MOUTH EVERY DAY  ?  ? Cardiovascular:  Diuretics - Loop Failed - 10/19/2021 12:03 PM  ?  ?  Failed - Ca in normal range and within 180 days  ?  Calcium  ?Date Value Ref Range Status  ?10/03/2021 8.3 (L) 8.9 - 10.3 mg/dL Final  ? ?Calcium, Total  ?Date Value Ref Range Status  ?04/02/2014 8.1 (L) 8.5 - 10.1 mg/dL Final  ?   ?  ?  Failed - Cr in normal range and within 180 days  ?  Creatinine  ?Date Value Ref Range Status  ?04/02/2014 0.75 0.60 - 1.30 mg/dL Final  ? ?Creatinine, Ser  ?Date Value Ref Range Status  ?10/03/2021 1.81 (H) 0.44 - 1.00 mg/dL Final  ?   ?  ?  Passed - K in normal range and within 180 days  ?  Potassium  ?Date Value Ref Range Status  ?10/03/2021 4.7 3.5 - 5.1 mmol/L Final  ?04/02/2014 3.8 3.5 - 5.1 mmol/L Final  ?   ?  ?  Passed - Na in normal range and within 180 days  ?  Sodium  ?Date Value Ref Range Status  ?10/03/2021 135 135 - 145 mmol/L Final  ?01/25/2021 141 137 - 147 Final  ?04/02/2014 141 136 - 145 mmol/L Final  ?   ?  ?  Passed - Cl in normal range and within 180 days  ?  Chloride  ?Date Value Ref Range Status  ?10/03/2021 108 98 - 111 mmol/L Final  ?04/02/2014 108 (H) 98 - 107 mmol/L Final  ?   ?  ?  Passed - Mg Level in normal range and within 180 days  ?  Magnesium  ?Date Value Ref Range Status  ?08/25/2021 2.3  1.6 - 2.3 mg/dL Final  ?   ?  ?  Passed - Last BP in normal range  ?  BP Readings from Last 1 Encounters:  ?10/03/21 92/60  ?   ?  ?  Passed - Valid encounter within last 6 months  ?  Recent Outpatient Visits   ?      ? 1 month ago Primary hypertension  ? [facility] [name], MD  ? 4 months ago Primary hypertension  ? [facility] [name], MD  ? 8 months ago Chronic pain due to neoplasm  ? [facility] [name], MD  ? 10 months ago Type 2 diabetes mellitus with diabetic peripheral angiopathy without gangrene, with long-term current use of insulin ([place])  ? [facility] [name], MD  ? 1 year ago Type 2 diabetes mellitus with diabetic peripheral angiopathy without gangrene, with long-term current use of insulin ([place])  ? [facility] [name], MD  ?  ?  ?Future Appointments   ?        ? In 7 months [name], MD [facility] [place]  ?  ? ?  ?  ?  ? ?

## 2021-10-19 NOTE — Telephone Encounter (Signed)
Requested medication (s) are due for refill today - no ? ?Requested medication (s) are on the active medication list -no ? ?Future visit scheduled -yes ? ?Last refill: unknown ? ?Notes to clinic: medication no longer on current medication list ? ?Requested Prescriptions  ?Pending Prescriptions Disp Refills  ? furosemide (LASIX) 40 MG tablet [Pharmacy Med Name: FUROSEMIDE 40 MG TABLET] 90 tablet 1  ?  Sig: TAKE 1 TABLET BY MOUTH EVERY DAY  ?  ? Cardiovascular:  Diuretics - Loop Failed - 10/19/2021 12:03 PM  ?  ?  Failed - Ca in normal range and within 180 days  ?  Calcium  ?Date Value Ref Range Status  ?10/03/2021 8.3 (L) 8.9 - 10.3 mg/dL Final  ? ?Calcium, Total  ?Date Value Ref Range Status  ?04/02/2014 8.1 (L) 8.5 - 10.1 mg/dL Final  ?  ?  ?  ?  Failed - Cr in normal range and within 180 days  ?  Creatinine  ?Date Value Ref Range Status  ?04/02/2014 0.75 0.60 - 1.30 mg/dL Final  ? ?Creatinine, Ser  ?Date Value Ref Range Status  ?10/03/2021 1.81 (H) 0.44 - 1.00 mg/dL Final  ?  ?  ?  ?  Passed - K in normal range and within 180 days  ?  Potassium  ?Date Value Ref Range Status  ?10/03/2021 4.7 3.5 - 5.1 mmol/L Final  ?04/02/2014 3.8 3.5 - 5.1 mmol/L Final  ?  ?  ?  ?  Passed - Na in normal range and within 180 days  ?  Sodium  ?Date Value Ref Range Status  ?10/03/2021 135 135 - 145 mmol/L Final  ?01/25/2021 141 137 - 147 Final  ?04/02/2014 141 136 - 145 mmol/L Final  ?  ?  ?  ?  Passed - Cl in normal range and within 180 days  ?  Chloride  ?Date Value Ref Range Status  ?10/03/2021 108 98 - 111 mmol/L Final  ?04/02/2014 108 (H) 98 - 107 mmol/L Final  ?  ?  ?  ?  Passed - Mg Level in normal range and within 180 days  ?  Magnesium  ?Date Value Ref Range Status  ?08/25/2021 2.3 1.6 - 2.3 mg/dL Final  ?  ?  ?  ?  Passed - Last BP in normal range  ?  BP Readings from Last 1 Encounters:  ?10/03/21 92/60  ?  ?  ?  ?  Passed - Valid encounter within last 6 months  ?  Recent Outpatient Visits   ? ?      ? 1 month ago Primary  hypertension  ? [facility] [name], MD  ? 4 months ago Primary hypertension  ? [facility] [name], MD  ? 8 months ago Chronic pain due to neoplasm  ? [facility] [name], MD  ? 10 months ago Type 2 diabetes mellitus with diabetic peripheral angiopathy without gangrene, with long-term current use of insulin ([place])  ? [facility] [name], MD  ? 1 year ago Type 2 diabetes mellitus with diabetic peripheral angiopathy without gangrene, with long-term current use of insulin ([place])  ? [facility] [name], MD  ? ?  ?  ?Future Appointments   ? ?        ? In 7 months [name], MD [facility] [place]  ? ?  ? ? ?  ?  ?  ?Signed Prescriptions Disp  Refills  ? ACCU-CHEK GUIDE test strip 100 strip 1  ?  Sig: USE UP TO 4 TIMES A DAY AS DIRECTED  ?  ? Endocrinology: Diabetes - Testing Supplies Passed - 10/19/2021 12:03 PM  ?  ?  Passed - Valid encounter within last 12 months  ?  Recent Outpatient Visits   ? ?      ? 1 month ago Primary hypertension  ? [facility] [name], MD  ? 4 months ago Primary hypertension  ? [facility] [name], MD  ? 8 months ago Chronic pain due to neoplasm  ? [facility] [name], MD  ? 10 months ago Type 2 diabetes mellitus with diabetic peripheral angiopathy without gangrene, with long-term current use of insulin ([place])  ? [facility] [name], MD  ? 1 year ago Type 2 diabetes mellitus with diabetic peripheral angiopathy without gangrene, with long-term current use of insulin ([place])  ? [facility] [name], MD  ? ?  ?  ?Future Appointments   ? ?        ? In 7 months [name], MD [facility]  ? ?  ? ? ?  ?  ?  ? LANTUS SOLOSTAR 100 UNIT/ML Solostar Pen 15 mL 0  ?  Sig: PLEASE INJECT 16 UNITS UNDER THE SKIN EACH NIGHT  ?  ? Endocrinology:  Diabetes - Insulins Passed - 10/19/2021 12:03 PM  ?  ?  Passed -  HBA1C is between 0 and 7.9 and within 180 days  ?  Hemoglobin A1C  ?Date Value Ref Range Status  ?01/25/2021 7.8  Final  ? ?Hgb A1c MFr Bld  ?Date Value Ref Range Status  ?08/25/2021 7.6 (H) 4.8 - 5.6 % Final  ?  Comment:  ?           Prediabetes: 5.7 - 6.4 ?         Diabetes: >6.4 ?         Glycemic control for adults with diabetes: <7.0 ?  ?  ?  ?  ?  Passed - Valid encounter within last 6 months  ?  Recent Outpatient Visits   ? ?      ? 1 month ago Primary hypertension  ? [facility] [name], MD  ? 4 months ago Primary hypertension  ? [facility] [name], MD  ? 8 months ago Chronic pain due to neoplasm  ? [facility] [name], MD  ? 10 months ago Type 2 diabetes mellitus with diabetic peripheral angiopathy without gangrene, with long-term current use of insulin ([place])  ? [facility] [name], MD  ? 1 year ago Type 2 diabetes mellitus with diabetic peripheral angiopathy without gangrene, with long-term current use of insulin ([place])  ? [facility] [name], MD  ? ?  ?  ?Future Appointments   ? ?        ? In 7 months [name], MD [facility] [place]  ? ?  ? ? ?  ?  ?  ? ? ? ?Requested Prescriptions  ?Pending Prescriptions Disp Refills  ? furosemide (LASIX) 40 MG tablet [Pharmacy Med Name: FUROSEMIDE 40 MG TABLET] 90 tablet 1  ?  Sig: TAKE 1 TABLET BY MOUTH EVERY DAY  ?  ? Cardiovascular:  Diuretics - Loop Failed - 10/19/2021 12:03 PM  ?  ?  Failed - Ca in normal range and within 180 days  ?  Calcium  ?Date Value Ref Range Status  ?10/03/2021 8.3 (L) 8.9 - 10.3 mg/dL Final  ? ?Calcium, Total  ?Date Value Ref Range Status  ?04/02/2014 8.1 (L) 8.5 - 10.1 mg/dL Final  ?  ?  ?  ?  Failed - Cr in normal range and within 180 days  ?  Creatinine  ?Date Value Ref Range Status  ?04/02/2014 0.75 0.60 - 1.30 mg/dL Final  ? ?Creatinine, Ser  ?Date Value Ref Range Status  ?10/03/2021 1.81 (H) 0.44 - 1.00 mg/dL Final  ?  ?  ?  ?   Passed - K in normal range and within 180 days  ?  Potassium  ?Date Value Ref Range Status  ?10/03/2021 4.7 3.5 - 5.1 mmol/L Final  ?04/02/2014 3.8 3.5 - 5.1 mmol/L Final  ?  ?  ?  ?  Passed - Na in normal range and within 180 days  ?  Sodium  ?Date Value Ref Range Status  ?10/03/2021 135 135 - 145 mmol/L Final  ?01/25/2021 141 137 - 147 Final  ?04/02/2014 141 136 - 145 mmol/L Final  ?  ?  ?  ?  Passed - Cl in normal range and within 180 days  ?  Chloride  ?Date Value Ref Range Status  ?10/03/2021 108 98 - 111 mmol/L Final  ?04/02/2014 108 (H) 98 - 107 mmol/L Final  ?  ?  ?  ?  Passed - Mg Level in normal range and within 180 days  ?  Magnesium  ?Date Value Ref Range Status  ?08/25/2021 2.3 1.6 - 2.3 mg/dL Final  ?  ?  ?  ?  Passed - Last BP in normal range  ?  BP Readings from Last 1 Encounters:  ?10/03/21 92/60  ?  ?  ?  ?  Passed - Valid encounter within last 6 months  ?  Recent Outpatient Visits   ? ?      ? 1 month ago Primary hypertension  ? [facility] [name], MD  ? 4 months ago Primary hypertension  ? [facility] [name], MD  ? 8 months ago Chronic pain due to neoplasm  ? [facility] [name], MD  ? 10 months ago Type 2 diabetes mellitus with diabetic peripheral angiopathy without gangrene, with long-term current use of insulin ([place])  ? [facility] [name], MD  ? 1 year ago Type 2 diabetes mellitus with diabetic peripheral angiopathy without gangrene, with long-term current use of insulin ([place])  ? [facility] [name], MD  ? ?  ?  ?Future Appointments   ? ?        ? In 7 months [name], MD [facility] [place]  ? ?  ? ? ?  ?  ?  ?Signed Prescriptions Disp Refills  ? ACCU-CHEK GUIDE test strip 100 strip 1  ?  Sig: USE UP TO 4 TIMES A DAY AS DIRECTED  ?  ? Endocrinology: Diabetes - Testing Supplies Passed - 10/19/2021 12:03 PM  ?  ?  Passed - Valid encounter within last 12 months  ?  Recent Outpatient Visits    ? ?      ? 1 month ago Primary hypertension  ? [facility] [name], MD  ? 4 months ago Primary hypertension  ? [facility] [name],  MD  ? 8 months ago Chronic pain due to ne

## 2021-10-20 ENCOUNTER — Telehealth: Payer: Self-pay | Admitting: Family Medicine

## 2021-10-20 NOTE — Telephone Encounter (Signed)
.. ?  Medicaid Managed Care  ? ?Unsuccessful Outreach Note ? ?10/20/2021 ?Name: [name] [name] MRN: [id number] DOB: 1956/05/24 ? ?Referred by: [name], MD ?Reason for referral : High Risk Managed Medicaid (I called the patient to get her rescheduled with the MM RNCM. I left my name and number on her VM.) ? ? ?Third unsuccessful telephone outreach was attempted today. The patient was referred to the case management team for assistance with care management and care coordination. The patient's primary care provider has been notified of our unsuccessful attempts to make or maintain contact with the patient. The care management team is pleased to engage with this patient at any time in the future should he/she be interested in assistance from the care management team.  ? ?Follow Up Plan: We have been unable to make contact with the patient for follow up. The care management team is available to follow up with the patient after provider conversation with the patient regarding recommendation for care management engagement and subsequent re-referral to the care management team.  ? ? ?Reita Chard ?Care Guide, High Risk Medicaid Managed Care ?Embedded Care Coordination ?[place]  ? ? ? ?SIGNATURE  ?

## 2021-10-27 ENCOUNTER — Encounter: Payer: Self-pay | Admitting: Oncology

## 2021-10-31 ENCOUNTER — Inpatient Hospital Stay: Payer: Medicaid Other | Attending: Oncology

## 2021-10-31 ENCOUNTER — Inpatient Hospital Stay (HOSPITAL_BASED_OUTPATIENT_CLINIC_OR_DEPARTMENT_OTHER): Payer: Medicaid Other | Admitting: Oncology

## 2021-10-31 ENCOUNTER — Encounter: Payer: Self-pay | Admitting: Oncology

## 2021-10-31 ENCOUNTER — Inpatient Hospital Stay: Payer: Medicaid Other

## 2021-10-31 VITALS — BP 109/69 | HR 97 | Temp 97.5°F | Resp 18 | Wt 104.1 lb

## 2021-10-31 DIAGNOSIS — N1832 Chronic kidney disease, stage 3b: Secondary | ICD-10-CM | POA: Diagnosis not present

## 2021-10-31 DIAGNOSIS — C3432 Malignant neoplasm of lower lobe, left bronchus or lung: Secondary | ICD-10-CM | POA: Diagnosis not present

## 2021-10-31 DIAGNOSIS — Z8249 Family history of ischemic heart disease and other diseases of the circulatory system: Secondary | ICD-10-CM | POA: Insufficient documentation

## 2021-10-31 DIAGNOSIS — E440 Moderate protein-calorie malnutrition: Secondary | ICD-10-CM | POA: Insufficient documentation

## 2021-10-31 DIAGNOSIS — C3492 Malignant neoplasm of unspecified part of left bronchus or lung: Secondary | ICD-10-CM

## 2021-10-31 DIAGNOSIS — R7989 Other specified abnormal findings of blood chemistry: Secondary | ICD-10-CM | POA: Insufficient documentation

## 2021-10-31 DIAGNOSIS — E1122 Type 2 diabetes mellitus with diabetic chronic kidney disease: Secondary | ICD-10-CM | POA: Diagnosis not present

## 2021-10-31 DIAGNOSIS — E860 Dehydration: Secondary | ICD-10-CM

## 2021-10-31 DIAGNOSIS — R110 Nausea: Secondary | ICD-10-CM | POA: Diagnosis not present

## 2021-10-31 DIAGNOSIS — Z833 Family history of diabetes mellitus: Secondary | ICD-10-CM | POA: Diagnosis not present

## 2021-10-31 DIAGNOSIS — Z79899 Other long term (current) drug therapy: Secondary | ICD-10-CM | POA: Diagnosis not present

## 2021-10-31 DIAGNOSIS — R42 Dizziness and giddiness: Secondary | ICD-10-CM | POA: Insufficient documentation

## 2021-10-31 DIAGNOSIS — R5383 Other fatigue: Secondary | ICD-10-CM | POA: Insufficient documentation

## 2021-10-31 DIAGNOSIS — Z87891 Personal history of nicotine dependence: Secondary | ICD-10-CM | POA: Diagnosis not present

## 2021-10-31 DIAGNOSIS — I739 Peripheral vascular disease, unspecified: Secondary | ICD-10-CM | POA: Insufficient documentation

## 2021-10-31 DIAGNOSIS — I252 Old myocardial infarction: Secondary | ICD-10-CM | POA: Diagnosis not present

## 2021-10-31 DIAGNOSIS — I129 Hypertensive chronic kidney disease with stage 1 through stage 4 chronic kidney disease, or unspecified chronic kidney disease: Secondary | ICD-10-CM | POA: Diagnosis not present

## 2021-10-31 DIAGNOSIS — I700 Atherosclerosis of aorta: Secondary | ICD-10-CM | POA: Diagnosis not present

## 2021-10-31 DIAGNOSIS — I2510 Atherosclerotic heart disease of native coronary artery without angina pectoris: Secondary | ICD-10-CM | POA: Diagnosis not present

## 2021-10-31 DIAGNOSIS — E785 Hyperlipidemia, unspecified: Secondary | ICD-10-CM | POA: Insufficient documentation

## 2021-10-31 DIAGNOSIS — Z95828 Presence of other vascular implants and grafts: Secondary | ICD-10-CM

## 2021-10-31 DIAGNOSIS — R739 Hyperglycemia, unspecified: Secondary | ICD-10-CM | POA: Diagnosis not present

## 2021-10-31 LAB — COMPREHENSIVE METABOLIC PANEL
ALT: 43 U/L (ref 0–44)
AST: 65 U/L — ABNORMAL HIGH (ref 15–41)
Albumin: 3.1 g/dL — ABNORMAL LOW (ref 3.5–5.0)
Alkaline Phosphatase: 163 U/L — ABNORMAL HIGH (ref 38–126)
Anion gap: 8 (ref 5–15)
BUN: 37 mg/dL — ABNORMAL HIGH (ref 8–23)
CO2: 17 mmol/L — ABNORMAL LOW (ref 22–32)
Calcium: 8.3 mg/dL — ABNORMAL LOW (ref 8.9–10.3)
Chloride: 109 mmol/L (ref 98–111)
Creatinine, Ser: 1.55 mg/dL — ABNORMAL HIGH (ref 0.44–1.00)
GFR, Estimated: 37 mL/min — ABNORMAL LOW (ref >60)
Glucose, Bld: 135 mg/dL — ABNORMAL HIGH (ref 70–99)
Potassium: 3.7 mmol/L (ref 3.5–5.1)
Sodium: 134 mmol/L — ABNORMAL LOW (ref 135–145)
Total Bilirubin: 0.6 mg/dL (ref 0.3–1.2)
Total Protein: 6.1 g/dL — ABNORMAL LOW (ref 6.5–8.1)

## 2021-10-31 LAB — CBC WITH DIFFERENTIAL/PLATELET
Abs Immature Granulocytes: 0.03 10*3/uL (ref 0.00–0.07)
Basophils Absolute: 0 10*3/uL (ref 0.0–0.1)
Basophils Relative: 1 %
Eosinophils Absolute: 0.1 10*3/uL (ref 0.0–0.5)
Eosinophils Relative: 1 %
HCT: 30.6 % — ABNORMAL LOW (ref 36.0–46.0)
Hemoglobin: 9.8 g/dL — ABNORMAL LOW (ref 12.0–15.0)
Immature Granulocytes: 1 %
Lymphocytes Relative: 18 %
Lymphs Abs: 1 10*3/uL (ref 0.7–4.0)
MCH: 29.9 pg (ref 26.0–34.0)
MCHC: 32 g/dL (ref 30.0–36.0)
MCV: 93.3 fL (ref 80.0–100.0)
Monocytes Absolute: 0.6 10*3/uL (ref 0.1–1.0)
Monocytes Relative: 11 %
Neutro Abs: 3.7 10*3/uL (ref 1.7–7.7)
Neutrophils Relative %: 68 %
Platelets: 372 10*3/uL (ref 150–400)
RBC: 3.28 MIL/uL — ABNORMAL LOW (ref 3.87–5.11)
RDW: 15.3 % (ref 11.5–15.5)
WBC: 5.4 10*3/uL (ref 4.0–10.5)
nRBC: 0 % (ref 0.0–0.2)

## 2021-10-31 MED ORDER — SODIUM CHLORIDE 0.9 % IV SOLN
Freq: Once | INTRAVENOUS | Status: AC
  Filled 2021-10-31: qty 250

## 2021-10-31 MED ORDER — DRONABINOL 2.5 MG PO CAPS: 2.5000 mg | ORAL_CAPSULE | Freq: Two times a day (BID) | ORAL | 0 refills | Status: DC

## 2021-10-31 MED ORDER — SODIUM CHLORIDE 0.9% FLUSH
10.0000 mL | Freq: Once | INTRAVENOUS | Status: AC
  Administered 2021-10-31: 10 mL via INTRAVENOUS
  Filled 2021-10-31: qty 10

## 2021-10-31 MED ORDER — HEPARIN SOD (PORK) LOCK FLUSH 100 UNIT/ML IV SOLN
500.0000 [IU] | Freq: Once | INTRAVENOUS | Status: AC
  Administered 2021-10-31: 500 [IU] via INTRAVENOUS
  Filled 2021-10-31: qty 5

## 2021-10-31 MED ORDER — PREDNISONE 5 MG PO TABS: 5.0000 mg | ORAL_TABLET | Freq: Every day | ORAL | 0 refills | Status: DC

## 2021-10-31 MED ORDER — SODIUM CHLORIDE 0.9 % IV SOLN
Freq: Once | INTRAVENOUS | Status: DC
  Filled 2021-10-31: qty 250

## 2021-10-31 MED ORDER — PREDNISONE 1 MG PO TABS: 2.0000 mg | ORAL_TABLET | Freq: Every day | ORAL | 0 refills | Status: DC

## 2021-10-31 NOTE — Patient Instructions (Signed)

## 2021-10-31 NOTE — Progress Notes (Signed)
?Hematology/Oncology Progress note ?[phone] [phone] ?  ? ? ? ?Patient Care Team: ?[name], MD as PCP - General (Family Medicine) ?Earlie Server, MD as Consulting Physician (Hematology and Oncology) ? ?REFERRING PROVIDER: ?[name], MD  ?CHIEF COMPLAINTS/REASON FOR VISIT:  ?Follow up for stage IIIA lung cancer ? ?HISTORY OF PRESENTING ILLNESS:  ?Patient was recently seen by urology Dr. Candiss Nor[place] for evaluation of microscopic hematuria, frequent UTI. ?11/17/2020, CT hematuria work-up was obtained which showed no evidence of urinary tract calculus or hydronephrosis.  Small left renal cyst. ?Incidental findings of 1.9 x 1.2 cm left lower lobe pulmonary nodule worrisome for malignancy. ?Patient was referred to establish care with oncology for further evaluation ? ?Patient denies any shortness of breath, cough, hemoptysis, unintentional weight loss, fever or night sweats. ?She currently smokes 1 cigar/day.  She started smoking cigarettes at age of 62.  She quitted at age of 19 and restarted smoking cigar a few years ago. ? ?Patient has a history of depression and is on Cymbalta.  Diabetes, hyperlipidemia, hypertension, peripheral artery disease ?Per patient she also has history of heart attack in 2011. ?History of right lower extremity BKA in 2021, 02/15/2020 history of pulmonary embolism involving segmental and subsegmental branches of right upper, middle, lower lobe pulmonary arteries.  She was put on on apixaban. ?Patient lives with her aunt. ? ?12/07/2020, PET scan showed left lower lobe 1.3 x 1.8 cm lung nodule with SUV of 6.5.  Suspect early stage primary lung neoplasm.  No hilar or mediastinal lymphadenopathy activity. ? ?# 12/23/2020 S/p CT guided biopsy of left lower lobe mass.  ?Pathology showed non-small cell carcinoma, favor adenocarcinoma.  Positive for TTF-1, negative for p40.   ? ? ?# 01/17/2021, patient underwent left lower lobectomy with lymph node dissection. ?Pathology  showed invasive poorly differentiated adenocarcinoma, 2.2 cm.  Tumor abuts pleura but visceral pleural surface is not involved by carcinoma.  LVI invasion is present, resection margins are negative for carcinoma.  10 lymph nodes were harvested and 3 lymph nodes were involved with carcinoma. pT1c pN2 ?Patient's case was discussed at [facility] oncology tumor board.  And consensus recommendation was adjuvant chemotherapy and sent tissue for molecular/PD-L1 testing. ? ?Foundation one testing showed PD-L1 1,  ?No reportable alterations ?ERBB2 S310F ? ?# 02/28/2021 adjuvant carboplatin and Taxol ?07/29/2021 Medi port placed by IR.  ? ?09/28/2021, CT chest without contrast showed no recurrent malignancy in the chest.  Aortic atherosclerosis, CAD.  Small left renal hypodense lesions are probably benign.  Nonspecific. ? ?INTERVAL HISTORY ?[name] [name] is a 66 y.o. female who has above history reviewed by me today presents for follow up visit for Stage IIIA lung cancer.  ?She feels ok today. "I am still not eating".  ?She has been on prednisone tapering course and has been on 53m daily.  ?Appetite is not good. She felt some improvement with while she was on Marinol 2.593mBID.  ?Lost 1 pound since last visit.  ?Occasionally she has nausea, no vomiting. She takes anti emetics.  ? ? ?Review of Systems  ?Constitutional:  Positive for fatigue and unexpected weight change. Negative for appetite change, chills and fever.  ?HENT:   Negative for hearing loss and voice change.   ?Eyes:  Negative for eye problems.  ?Respiratory:  Negative for chest tightness and cough.   ?Cardiovascular:  Negative for chest pain.  ?Gastrointestinal:  Negative for abdominal distention, abdominal pain, blood in stool, diarrhea and nausea.  ?Endocrine: Negative  for hot flashes.  ?Genitourinary:  Negative for difficulty urinating, dysuria and frequency.   ?Musculoskeletal:  Negative for arthralgias.  ?Skin:  Negative for itching and rash.  ?Neurological:   Positive for light-headedness. Negative for extremity weakness.  ?Hematological:  Negative for adenopathy.  ?Psychiatric/Behavioral:  Negative for confusion.   ? ?MEDICAL HISTORY:  ?Past Medical History:  ?Diagnosis Date  ? Anemia   ? Cancer [facility])   ? CHF (congestive heart failure) ([place])   ? 02/16/20 HFrEF 20-25% in setting of acute DVT/PE, underlying CAD  ? CKD (chronic kidney disease)   ? Coronary artery disease   ? 02/20/20: CTO pRCA with left-to-right collaterals, 50% mLAD, 80% OM1, medical therapy  ? Depression   ? Diabetes ([place])   ? Diabetes mellitus without complication ([place])   ? DVT (deep venous thrombosis) ([place]) 02/16/2020  ? RLE DVT proximal veins 02/16/20 Duplex  ? Hyperlipidemia   ? Hypertension   ? Myocardial infarction [facility]) 2009  ? Stress related per patient; NSTEMI 2012 100% RCA with left-to-right collaterals  ? PE (pulmonary thromboembolism) ([place]) 02/15/2020  ? 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, RLE BKA  ? Peripheral artery disease ([place])   ? ? ?SURGICAL HISTORY: ?Past Surgical History:  ?Procedure Laterality Date  ? IR IMAGING GUIDED PORT INSERTION  07/27/2021  ? Right lower extremity BKA Right   ? ? ?SOCIAL HISTORY: ?Social History  ? ?Socioeconomic History  ? Marital status: Married  ?  Spouse name: Not on file  ? Number of children: Not on file  ? Years of education: Not on file  ? Highest education level: Not on file  ?Occupational History  ? Not on file  ?Tobacco Use  ? Smoking status: Former  ?  Packs/day: 0.25  ?  Types: Cigars, Cigarettes  ?  Quit date: 01/17/2021  ?  Years since quitting: 0.7  ? Smokeless tobacco: Never  ?Vaping Use  ? Vaping Use: Never used  ?Substance and Sexual Activity  ? Alcohol use: Never  ? Drug use: Not Currently  ? Sexual activity: Not Currently  ?Other Topics Concern  ? Not on file  ?Social History Narrative  ? Not on file  ? ?Social Determinants of Health  ? ?Financial Resource Strain: Not on file  ?Food Insecurity: Not on file  ?Transportation Needs:  Unmet Transportation Needs  ? Lack of Transportation (Medical): Yes  ? Lack of Transportation (Non-Medical): Yes  ?Physical Activity: Not on file  ?Stress: Not on file  ?Social Connections: Not on file  ?Intimate Partner Violence: Not on file  ? ? ?FAMILY HISTORY: ?Family History  ?Problem Relation Age of Onset  ? Heart disease Mother   ? Diabetes Mother   ? Heart disease Father   ? Diabetes Father   ? ? ?ALLERGIES:  is allergic to lisinopril. ? ?MEDICATIONS:  ?Current Outpatient Medications  ?Medication Sig Dispense Refill  ? ACCU-CHEK GUIDE test strip USE UP TO 4 TIMES A DAY AS DIRECTED 100 strip 1  ? Accu-Chek Softclix Lancets lancets SMARTSIG:Topical 1-4 Times Daily    ? aspirin EC 81 MG tablet Take 81 mg by mouth in the morning. Swallow whole.    ? blood glucose meter kit and supplies KIT Dispense based on patient and insurance preference. Use up to four times daily as directed. 1 each 0  ? carboxymethylcellulose (REFRESH PLUS) 0.5 % SOLN Place 1 drop into both eyes in the morning and at bedtime.    ? diphenhydrAMINE (BENADRYL) 25  MG tablet Take 25 mg by mouth at bedtime.    ? docusate sodium (COLACE) 100 MG capsule Take 1 capsule (100 mg total) by mouth 2 (two) times daily. 60 capsule 1  ? DULoxetine (CYMBALTA) 20 MG capsule SMARTSIG:2 Capsule(s) By Mouth Morning-Night    ? empagliflozin (JARDIANCE) 10 MG TABS tablet Take 1 tablet (10 mg total) by mouth daily. 90 tablet 3  ? gabapentin (NEURONTIN) 100 MG capsule Take 1 capsule (100 mg total) by mouth 2 (two) times daily. 60 capsule 5  ? HUMALOG KWIKPEN 100 UNIT/ML KwikPen Inject 8 Units into the skin 3 (three) times daily after meals. 15 mL 11  ? isosorbide dinitrate (ISORDIL) 30 MG tablet Take 1 tablet (30 mg total) by mouth 3 (three) times daily. 270 tablet 1  ? ketoconazole (NIZORAL) 2 % cream APPLY 1 APPLICATION TOPICALLY IN THE MORNING AND AT BEDTIME. APPLIED TO FEET 60 g 1  ? LANTUS SOLOSTAR 100 UNIT/ML Solostar Pen PLEASE INJECT 16 UNITS UNDER THE SKIN  EACH NIGHT 15 mL 0  ? lidocaine-prilocaine (EMLA) cream Apply 1 application topically as needed. Apply to port and cover with saran wrap 1-2 hours prior to port access 30 g 1  ? metFORMIN (GLUCOPHAGE-XR) 50

## 2021-11-02 ENCOUNTER — Inpatient Hospital Stay: Payer: Medicaid Other

## 2021-11-02 NOTE — Progress Notes (Addendum)
Nutrition Follow-up: ? ?Called patient for nutrition follow-up.  No answer. Left message on voicemail with call back number. ? ?Called patient again at 2:24pm.  No answer.  Did not leave second voicemail. ? ?Patient returned RD's call ? ?Patient with lung cancer.  Currently on surveillance.   ? ?Patient reports via phone that her appetite is poor.  Says she ate 1/2 grilled cheese sandwich today at 6:30am but nothing else (now 2:30pm).  Says that she has nausea but taking compazine.  Also says that her stomach is irritated.  Does not think she has been taking protonix (RD see in medication list).  Has not picked up Marinol yet.  Patient is not drinking shakes due to stomach issues and nausea.  Says she has a bowel movement each day.   ? ? ?Medications: reviewed ? ?Labs: reviewed ? ?Anthropometrics:  ? ?Weight 104 lb 1.6 oz on 5/15 ? ?105 lb on 4/17 ?110 lb on 3/27 ?109 lb on 3/13 ?104 lb on 3/9 ?116 lb on 2/27 ?114 lb on 2/8 ? ? ?NUTRITION DIAGNOSIS: Inadequate oral intake continues ? ? ?INTERVENTION:  ?Encouraged patient to pick up marinol to hopefully stimulate appetite ?Offered [facility] to evaluate stomach issues and patient declined.  Encouraged her to call cancer center if needed ?Message sent to MD regarding report of stomach issues.  ?  ? ?MONITORING, EVALUATION, GOAL: weight trends, intake ? ? ?NEXT VISIT: Tuesday, June 13 during infusion ? ?Riyah Bardon B. Zenia Resides, RD, LDN ?Registered Dietitian ?[phone] ? ? ? ?Ravleen Ries B. Zenia Resides, RD, LDN ?Registered Dietitian ?[phone] ? ?

## 2021-11-03 ENCOUNTER — Telehealth: Payer: Self-pay

## 2021-11-03 ENCOUNTER — Other Ambulatory Visit: Payer: Self-pay

## 2021-11-03 DIAGNOSIS — Z7952 Long term (current) use of systemic steroids: Secondary | ICD-10-CM

## 2021-11-03 DIAGNOSIS — N3942 Incontinence without sensory awareness: Secondary | ICD-10-CM | POA: Diagnosis not present

## 2021-11-03 DIAGNOSIS — E119 Type 2 diabetes mellitus without complications: Secondary | ICD-10-CM | POA: Diagnosis not present

## 2021-11-03 MED ORDER — PANTOPRAZOLE SODIUM 40 MG PO TBEC: 40.0000 mg | DELAYED_RELEASE_TABLET | Freq: Every day | ORAL | 2 refills | Status: DC

## 2021-11-03 NOTE — Telephone Encounter (Signed)
Rx for protonix sent to [facility] in [place]. Pt informed.

## 2021-11-03 NOTE — Telephone Encounter (Signed)
-----   Message from [name] Server, MD sent at 11/02/2021 10:04 PM EDT ----- [name] [name], please resent a Rx of protonix 40mg  daily and advise her to take. Thanks.  ----- Message ----- From: Jennet Maduro, RD Sent: 11/02/2021   2:54 PM EDT To: [name] Dun, RN, [name] Server, MD  Dr [name],  I spoke with [name] via phone and appetite is still poor.  She has not picked up marinol yet. I encouraged her to get this.  She is complaining about her stomach being "irritated" along with nausea which is keeping her from eating.  She says that she is taking compazine for nausea and does not think she is taking anything for acid reflux.  I saw protonix on medication list  but she says she does not have any of this.  She says she is having a regular bowel movement each day.  She has only eaten 1/2 grilled cheese sandwich early this am and nothing else.  Not drinking shakes due to stomach issues.  Can you or your team reach out to her about her stomach issues?  Does she need to restart protonix or something else?  I also discussed [facility] with her as well and she will call if needed.  [place]

## 2021-11-09 ENCOUNTER — Encounter: Payer: Self-pay | Admitting: Oncology

## 2021-11-28 ENCOUNTER — Other Ambulatory Visit: Payer: Self-pay

## 2021-11-28 ENCOUNTER — Ambulatory Visit: Payer: Medicaid Other | Admitting: Oncology

## 2021-11-28 ENCOUNTER — Inpatient Hospital Stay (HOSPITAL_BASED_OUTPATIENT_CLINIC_OR_DEPARTMENT_OTHER): Payer: Medicaid Other | Admitting: Oncology

## 2021-11-28 ENCOUNTER — Inpatient Hospital Stay: Payer: Medicaid Other | Attending: Oncology

## 2021-11-28 ENCOUNTER — Other Ambulatory Visit: Payer: Medicaid Other

## 2021-11-28 ENCOUNTER — Inpatient Hospital Stay: Payer: Medicaid Other

## 2021-11-28 ENCOUNTER — Encounter: Payer: Self-pay | Admitting: Oncology

## 2021-11-28 VITALS — BP 85/62 | HR 90 | Temp 96.0°F | Ht 65.0 in | Wt 97.0 lb

## 2021-11-28 DIAGNOSIS — E440 Moderate protein-calorie malnutrition: Secondary | ICD-10-CM | POA: Insufficient documentation

## 2021-11-28 DIAGNOSIS — Z8249 Family history of ischemic heart disease and other diseases of the circulatory system: Secondary | ICD-10-CM | POA: Diagnosis not present

## 2021-11-28 DIAGNOSIS — I252 Old myocardial infarction: Secondary | ICD-10-CM | POA: Diagnosis not present

## 2021-11-28 DIAGNOSIS — I130 Hypertensive heart and chronic kidney disease with heart failure and stage 1 through stage 4 chronic kidney disease, or unspecified chronic kidney disease: Secondary | ICD-10-CM | POA: Insufficient documentation

## 2021-11-28 DIAGNOSIS — Z794 Long term (current) use of insulin: Secondary | ICD-10-CM | POA: Insufficient documentation

## 2021-11-28 DIAGNOSIS — N1832 Chronic kidney disease, stage 3b: Secondary | ICD-10-CM | POA: Diagnosis not present

## 2021-11-28 DIAGNOSIS — E785 Hyperlipidemia, unspecified: Secondary | ICD-10-CM | POA: Insufficient documentation

## 2021-11-28 DIAGNOSIS — F32A Depression, unspecified: Secondary | ICD-10-CM | POA: Insufficient documentation

## 2021-11-28 DIAGNOSIS — I700 Atherosclerosis of aorta: Secondary | ICD-10-CM | POA: Insufficient documentation

## 2021-11-28 DIAGNOSIS — Z833 Family history of diabetes mellitus: Secondary | ICD-10-CM | POA: Insufficient documentation

## 2021-11-28 DIAGNOSIS — Z89511 Acquired absence of right leg below knee: Secondary | ICD-10-CM | POA: Diagnosis not present

## 2021-11-28 DIAGNOSIS — R5383 Other fatigue: Secondary | ICD-10-CM | POA: Insufficient documentation

## 2021-11-28 DIAGNOSIS — Z902 Acquired absence of lung [part of]: Secondary | ICD-10-CM | POA: Diagnosis not present

## 2021-11-28 DIAGNOSIS — Z888 Allergy status to other drugs, medicaments and biological substances status: Secondary | ICD-10-CM | POA: Diagnosis not present

## 2021-11-28 DIAGNOSIS — R3129 Other microscopic hematuria: Secondary | ICD-10-CM | POA: Diagnosis not present

## 2021-11-28 DIAGNOSIS — Z86718 Personal history of other venous thrombosis and embolism: Secondary | ICD-10-CM | POA: Diagnosis not present

## 2021-11-28 DIAGNOSIS — C3492 Malignant neoplasm of unspecified part of left bronchus or lung: Secondary | ICD-10-CM | POA: Diagnosis not present

## 2021-11-28 DIAGNOSIS — R739 Hyperglycemia, unspecified: Secondary | ICD-10-CM

## 2021-11-28 DIAGNOSIS — I2510 Atherosclerotic heart disease of native coronary artery without angina pectoris: Secondary | ICD-10-CM | POA: Diagnosis not present

## 2021-11-28 DIAGNOSIS — Z87440 Personal history of urinary (tract) infections: Secondary | ICD-10-CM | POA: Diagnosis not present

## 2021-11-28 DIAGNOSIS — Z7952 Long term (current) use of systemic steroids: Secondary | ICD-10-CM | POA: Diagnosis not present

## 2021-11-28 DIAGNOSIS — Z79899 Other long term (current) drug therapy: Secondary | ICD-10-CM | POA: Insufficient documentation

## 2021-11-28 DIAGNOSIS — F17290 Nicotine dependence, other tobacco product, uncomplicated: Secondary | ICD-10-CM | POA: Insufficient documentation

## 2021-11-28 DIAGNOSIS — I959 Hypotension, unspecified: Secondary | ICD-10-CM

## 2021-11-28 DIAGNOSIS — E1122 Type 2 diabetes mellitus with diabetic chronic kidney disease: Secondary | ICD-10-CM | POA: Insufficient documentation

## 2021-11-28 DIAGNOSIS — Z5111 Encounter for antineoplastic chemotherapy: Secondary | ICD-10-CM | POA: Diagnosis present

## 2021-11-28 DIAGNOSIS — I509 Heart failure, unspecified: Secondary | ICD-10-CM | POA: Diagnosis not present

## 2021-11-28 DIAGNOSIS — C3432 Malignant neoplasm of lower lobe, left bronchus or lung: Secondary | ICD-10-CM | POA: Insufficient documentation

## 2021-11-28 DIAGNOSIS — Z86711 Personal history of pulmonary embolism: Secondary | ICD-10-CM | POA: Diagnosis not present

## 2021-11-28 LAB — CBC WITH DIFFERENTIAL/PLATELET
Abs Immature Granulocytes: 0.01 10*3/uL (ref 0.00–0.07)
Basophils Absolute: 0 10*3/uL (ref 0.0–0.1)
Basophils Relative: 1 %
Eosinophils Absolute: 0.1 10*3/uL (ref 0.0–0.5)
Eosinophils Relative: 2 %
HCT: 30.4 % — ABNORMAL LOW (ref 36.0–46.0)
Hemoglobin: 9.4 g/dL — ABNORMAL LOW (ref 12.0–15.0)
Immature Granulocytes: 0 %
Lymphocytes Relative: 24 %
Lymphs Abs: 1.3 10*3/uL (ref 0.7–4.0)
MCH: 29.2 pg (ref 26.0–34.0)
MCHC: 30.9 g/dL (ref 30.0–36.0)
MCV: 94.4 fL (ref 80.0–100.0)
Monocytes Absolute: 0.4 10*3/uL (ref 0.1–1.0)
Monocytes Relative: 8 %
Neutro Abs: 3.5 10*3/uL (ref 1.7–7.7)
Neutrophils Relative %: 65 %
Platelets: 408 10*3/uL — ABNORMAL HIGH (ref 150–400)
RBC: 3.22 MIL/uL — ABNORMAL LOW (ref 3.87–5.11)
RDW: 14.2 % (ref 11.5–15.5)
WBC: 5.4 10*3/uL (ref 4.0–10.5)
nRBC: 0 % (ref 0.0–0.2)

## 2021-11-28 LAB — COMPREHENSIVE METABOLIC PANEL
ALT: 28 U/L (ref 0–44)
AST: 38 U/L (ref 15–41)
Albumin: 3 g/dL — ABNORMAL LOW (ref 3.5–5.0)
Alkaline Phosphatase: 205 U/L — ABNORMAL HIGH (ref 38–126)
Anion gap: 4 — ABNORMAL LOW (ref 5–15)
BUN: 21 mg/dL (ref 8–23)
CO2: 22 mmol/L (ref 22–32)
Calcium: 8.1 mg/dL — ABNORMAL LOW (ref 8.9–10.3)
Chloride: 108 mmol/L (ref 98–111)
Creatinine, Ser: 1.59 mg/dL — ABNORMAL HIGH (ref 0.44–1.00)
GFR, Estimated: 36 mL/min — ABNORMAL LOW (ref >60)
Glucose, Bld: 386 mg/dL — ABNORMAL HIGH (ref 70–99)
Potassium: 5 mmol/L (ref 3.5–5.1)
Sodium: 134 mmol/L — ABNORMAL LOW (ref 135–145)
Total Bilirubin: 0.4 mg/dL (ref 0.3–1.2)
Total Protein: 5.9 g/dL — ABNORMAL LOW (ref 6.5–8.1)

## 2021-11-28 MED ORDER — PREDNISONE 1 MG PO TABS: 1.0000 mg | ORAL_TABLET | Freq: Every day | ORAL | 0 refills | Status: DC

## 2021-11-28 NOTE — Progress Notes (Signed)
Hematology/Oncology Progress note [phone] [phone]      Patient Care Team: [name], MD as PCP - General (Family Medicine) Earlie Server, MD as Consulting Physician (Hematology and Oncology)  REFERRING PROVIDER: [name], MD  CHIEF COMPLAINTS/REASON FOR VISIT:  Follow up for stage IIIA lung cancer  HISTORY OF PRESENTING ILLNESS:  Patient was recently seen by urology Dr. Candiss Norse ski for evaluation of microscopic hematuria, frequent UTI. 11/17/2020, CT hematuria work-up was obtained which showed no evidence of urinary tract calculus or hydronephrosis.  Small left renal cyst. Incidental findings of 1.9 x 1.2 cm left lower lobe pulmonary nodule worrisome for malignancy. Patient was referred to establish care with oncology for further evaluation  Patient denies any shortness of breath, cough, hemoptysis, unintentional weight loss, fever or night sweats. She currently smokes 1 cigar/day.  She started smoking cigarettes at age of 77.  She quitted at age of 85 and restarted smoking cigar a few years ago.  Patient has a history of depression and is on Cymbalta.  Diabetes, hyperlipidemia, hypertension, peripheral artery disease Per patient she also has history of heart attack in 2011. History of right lower extremity BKA in 2021, 02/15/2020 history of pulmonary embolism involving segmental and subsegmental branches of right upper, middle, lower lobe pulmonary arteries.  She was put on on apixaban. Patient lives with her aunt.  12/07/2020, PET scan showed left lower lobe 1.3 x 1.8 cm lung nodule with SUV of 6.5.  Suspect early stage primary lung neoplasm.  No hilar or mediastinal lymphadenopathy activity.  # 12/23/2020 S/p CT guided biopsy of left lower lobe mass.  Pathology showed non-small cell carcinoma, favor adenocarcinoma.  Positive for TTF-1, negative for p40.     # 01/17/2021, patient underwent left lower lobectomy with lymph node dissection. Pathology  showed invasive poorly differentiated adenocarcinoma, 2.2 cm.  Tumor abuts pleura but visceral pleural surface is not involved by carcinoma.  LVI invasion is present, resection margins are negative for carcinoma.  10 lymph nodes were harvested and 3 lymph nodes were involved with carcinoma. pT1c pN2 Patient's case was discussed at [facility] oncology tumor board.  And consensus recommendation was adjuvant chemotherapy and sent tissue for molecular/PD-L1 testing.  Foundation one testing showed PD-L1 1,  No reportable alterations ERBB2 S310F  # 02/28/2021 adjuvant carboplatin and Taxol 07/29/2021 Medi port placed by IR.   09/28/2021, CT chest without contrast showed no recurrent malignancy in the chest.  Aortic atherosclerosis, CAD.  Small left renal hypodense lesions are probably benign.  Nonspecific.  INTERVAL HISTORY [name] [name] is a 66 y.o. female who has above history reviewed by me today presents for follow up visit for Stage IIIA lung cancer.  Appetite is not good.  She has been taking Marinol.  Has lost weight. Denies any shortness of breath, chest pain, night sweating, fever. Patient has finished course of 2 mg prednisone daily for 2 weeks.   Review of Systems  Constitutional:  Positive for fatigue and unexpected weight change. Negative for appetite change, chills and fever.  HENT:   Negative for hearing loss and voice change.   Eyes:  Negative for eye problems.  Respiratory:  Negative for chest tightness and cough.   Cardiovascular:  Negative for chest pain.  Gastrointestinal:  Negative for abdominal distention, abdominal pain, blood in stool, diarrhea and nausea.  Endocrine: Negative for hot flashes.  Genitourinary:  Negative for difficulty urinating, dysuria and frequency.   Musculoskeletal:  Negative for arthralgias.  Skin:  Negative for itching and rash.  Neurological:  Negative for extremity weakness and light-headedness.  Hematological:  Negative for adenopathy.   Psychiatric/Behavioral:  Negative for confusion.     MEDICAL HISTORY:  Past Medical History:  Diagnosis Date   Anemia    Cancer ([place])    CHF (congestive heart failure) ([place])    02/16/20 HFrEF 20-25% in setting of acute DVT/PE, underlying CAD   CKD (chronic kidney disease)    Coronary artery disease    02/20/20: CTO pRCA with left-to-right collaterals, 50% mLAD, 80% OM1, medical therapy   Depression    Diabetes ([place])    Diabetes mellitus without complication ([place])    DVT (deep venous thrombosis) ([place]) 02/16/2020   RLE DVT proximal veins 02/16/20 Duplex   Hyperlipidemia    Hypertension    Myocardial infarction [facility]) 2009   Stress related per patient; NSTEMI 2012 100% RCA with left-to-right collaterals   PE (pulmonary thromboembolism) ([place]) 02/15/2020   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, RLE BKA   Peripheral artery disease ([place])     SURGICAL HISTORY: Past Surgical History:  Procedure Laterality Date   IR IMAGING GUIDED PORT INSERTION  07/27/2021   Right lower extremity BKA Right     SOCIAL HISTORY: Social History   Socioeconomic History   Marital status: Married    Spouse name: Not on file   Number of children: Not on file   Years of education: Not on file   Highest education level: Not on file  Occupational History   Not on file  Tobacco Use   Smoking status: Former    Packs/day: 0.25    Types: Cigars, Cigarettes    Quit date: 01/17/2021    Years since quitting: 0.8   Smokeless tobacco: Never  Vaping Use   Vaping Use: Never used  Substance and Sexual Activity   Alcohol use: Never   Drug use: Not Currently   Sexual activity: Not Currently  Other Topics Concern   Not on file  Social History Narrative   Not on file   Social Determinants of Health   Financial Resource Strain: Not on file  Food Insecurity: Not on file  Transportation Needs: No Transportation Needs (11/28/2021)   PRAPARE - Transportation    Lack of Transportation (Medical): No     Lack of Transportation (Non-Medical): No  Recent Concern: Transportation Needs - Unmet Transportation Needs (10/31/2021)   PRAPARE - Hydrologist (Medical): Yes    Lack of Transportation (Non-Medical): Yes  Physical Activity: Not on file  Stress: Not on file  Social Connections: Not on file  Intimate Partner Violence: Not on file    FAMILY HISTORY: Family History  Problem Relation Age of Onset   Heart disease Mother    Diabetes Mother    Heart disease Father    Diabetes Father     ALLERGIES:  is allergic to lisinopril.  MEDICATIONS:  Current Outpatient Medications  Medication Sig Dispense Refill   ACCU-CHEK GUIDE test strip USE UP TO 4 TIMES A DAY AS DIRECTED 100 strip 1   Accu-Chek Softclix Lancets lancets SMARTSIG:Topical 1-4 Times Daily     aspirin EC 81 MG tablet Take 81 mg by mouth in the morning. Swallow whole.     blood glucose meter kit and supplies KIT Dispense based on patient and insurance preference. Use up to four times daily as directed. 1 each 0   carboxymethylcellulose (REFRESH PLUS) 0.5 % SOLN Place 1 drop  into both eyes in the morning and at bedtime.     diphenhydrAMINE (BENADRYL) 25 MG tablet Take 25 mg by mouth at bedtime.     docusate sodium (COLACE) 100 MG capsule Take 1 capsule (100 mg total) by mouth 2 (two) times daily. 60 capsule 1   dronabinol (MARINOL) 2.5 MG capsule Take 1 capsule (2.5 mg total) by mouth 2 (two) times daily before lunch and supper. 60 capsule 0   DULoxetine (CYMBALTA) 20 MG capsule SMARTSIG:2 Capsule(s) By Mouth Morning-Night     empagliflozin (JARDIANCE) 10 MG TABS tablet Take 1 tablet (10 mg total) by mouth daily. 90 tablet 3   gabapentin (NEURONTIN) 100 MG capsule Take 1 capsule (100 mg total) by mouth 2 (two) times daily. 60 capsule 5   HUMALOG KWIKPEN 100 UNIT/ML KwikPen Inject 8 Units into the skin 3 (three) times daily after meals. 15 mL 11   isosorbide dinitrate (ISORDIL) 30 MG tablet Take 1 tablet  (30 mg total) by mouth 3 (three) times daily. 270 tablet 1   ketoconazole (NIZORAL) 2 % cream APPLY 1 APPLICATION TOPICALLY IN THE MORNING AND AT BEDTIME. APPLIED TO FEET 60 g 1   LANTUS SOLOSTAR 100 UNIT/ML Solostar Pen PLEASE INJECT 16 UNITS UNDER THE SKIN EACH NIGHT 15 mL 0   lidocaine-prilocaine (EMLA) cream Apply 1 application topically as needed. Apply to port and cover with saran wrap 1-2 hours prior to port access 30 g 1   metFORMIN (GLUCOPHAGE-XR) 500 MG 24 hr tablet TAKE 1 TABLET BY MOUTH TWICE A DAY 180 tablet 2   metoprolol succinate (TOPROL-XL) 50 MG 24 hr tablet Take 1 tablet (50 mg total) by mouth daily. Take with or immediately following a meal. 90 tablet 1   Multiple Vitamin (MULTIVITAMIN WITH MINERALS) TABS tablet Take 1 tablet by mouth in the morning. Adults 50+     ondansetron (ZOFRAN) 8 MG tablet Take 1 tablet (8 mg total) by mouth 2 (two) times daily as needed (Nausea or vomiting). Start if needed on the third day after cisplatin. 30 tablet 1   pantoprazole (PROTONIX) 40 MG tablet Take 1 tablet (40 mg total) by mouth daily. 30 tablet 2   predniSONE (DELTASONE) 1 MG tablet Take 2 tablets (2 mg total) by mouth daily with breakfast. 28 tablet 0   predniSONE (DELTASONE) 5 MG tablet Take 1 tablet (5 mg total) by mouth daily with breakfast. 14 tablet 0   prochlorperazine (COMPAZINE) 10 MG tablet TAKE 1 TABLET (10 MG TOTAL) BY MOUTH EVERY 6 (SIX) HOURS AS NEEDED (NAUSEA OR VOMITING). 30 tablet 1   spironolactone (ALDACTONE) 25 MG tablet Take 1 tablet (25 mg total) by mouth daily. 90 tablet 1   No current facility-administered medications for this visit.   Facility-Administered Medications Ordered in Other Visits  Medication Dose Route Frequency Provider Last Rate Last Admin   heparin lock flush 100 UNIT/ML injection            heparin lock flush 100 unit/mL  500 Units Intravenous Once Earlie Server, MD         PHYSICAL EXAMINATION: ECOG PERFORMANCE STATUS: 1 - Symptomatic but  completely ambulatory Vitals:   11/28/21 1435  BP: (!) 85/62  Pulse: 90  Temp: (!) 96 F (35.6 C)   Filed Weights   11/28/21 1435  Weight: 97 lb (44 kg)    Physical Exam Constitutional:      General: She is not in acute distress.    Appearance: She is not ill-appearing.  HENT:     Head: Normocephalic and atraumatic.  Eyes:     General: No scleral icterus. Cardiovascular:     Rate and Rhythm: Normal rate and regular rhythm.     Heart sounds: Normal heart sounds.  Pulmonary:     Effort: Pulmonary effort is normal. No respiratory distress.     Breath sounds: No wheezing.     Comments: Absent breath sounds left basilar Abdominal:     General: Bowel sounds are normal. There is no distension.     Palpations: Abdomen is soft.  Musculoskeletal:        General: No deformity. Normal range of motion.     Cervical back: Normal range of motion and neck supple.     Comments: Right lower extremity BKA  Skin:    General: Skin is warm and dry.     Findings: No erythema or rash.  Neurological:     Mental Status: She is alert and oriented to person, place, and time. Mental status is at baseline.     Cranial Nerves: No cranial nerve deficit.     Coordination: Coordination normal.  Psychiatric:        Mood and Affect: Mood normal.     LABORATORY DATA:  I have reviewed the data as listed Lab Results  Component Value Date   WBC 5.4 11/28/2021   HGB 9.4 (L) 11/28/2021   HCT 30.4 (L) 11/28/2021   MCV 94.4 11/28/2021   PLT 408 (H) 11/28/2021   Recent Labs    08/02/21 1106 08/02/21 1108 10/03/21 1228 10/10/21 1427 10/31/21 0920 11/28/21 1412  NA  --    < > 135  --  134* 134*  K  --    < > 4.7  --  3.7 5.0  CL  --    < > 108  --  109 108  CO2  --    < > 21*  --  17* 22  GLUCOSE  --    < > 210*  --  135* 386*  BUN  --    < > 34*  --  37* 21  CREATININE  --    < > 1.81*  --  1.55* 1.59*  CALCIUM  --    < > 8.3*  --  8.3* 8.1*  GFRNONAA  --    < > 31*  --  37* 36*  PROT 6.5    < > 5.9* 5.8* 6.1* 5.9*  ALBUMIN 3.2*   < > 2.9* 3.0* 3.1* 3.0*  AST 1,220*   < > 73* 42* 65* 38  ALT 777*   < > 63* 47* 43 28  ALKPHOS  --    < > 243* 168* 163* 205*  BILITOT 2.6*   < > 0.5 0.3 0.6 0.4  BILIDIR 1.9*  --   --  <0.1  --   --   IBILI 0.7  --   --  NOT CALCULATED  --   --    < > = values in this interval not displayed.    Iron/TIBC/Ferritin/ %Sat    Component Value Date/Time   IRON 74 08/18/2021 0821   TIBC 400 08/18/2021 0821   FERRITIN 61 08/18/2021 0821   IRONPCTSAT 19 08/18/2021 0821      RADIOGRAPHIC STUDIES: I have personally reviewed the radiological images as listed and agreed with the findings in the report. No results found.    ASSESSMENT & PLAN:  1. Adenocarcinoma, lung, left ([place])   2. Moderate  protein-calorie malnutrition ([place])   3. Stage 3b chronic kidney disease ([place])   4. Hyperglycemia   5. Hypotension, unspecified hypotension type      Cancer Staging  Non-small cell lung cancer [facility]) Staging form: Lung, AJCC 8th Edition - Pathologic stage from 01/28/2021: Stage IIIA (pT1c, pN2, cM0) - Signed by Earlie Server, MD on 02/17/2021  #Stage IIIA left lung non-small cell lung cancer-.  Poorly differentiated adenocarcinoma.  Status post left lower lobectomy and lymph node dissection pT1c pN2 [pathology addendum changed to N2]  She has negative surgical margin, no clear benefit of postoperative RT.  S/p 4 cycles of adjuvant chemotherapy Carboplatin and Taxol.  S/p 1 cycle of Tecentriq, Tecentriq was discontinued due to severe immunotherapy induced liver toxicity Labs reviewed and discussed with patient.  Continue observation. Continue surveillance CT every 3 months- July 2023.   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 LFT has normalized. Recommend patient to take 1 mg of prednisone daily for 7 days.  Then stop.   #Chronic kidney insufficiency.  Creatinine is stable.  Encourage oral hydration. # poor oral intake, hypotension patient will  receive IV fluid 1 L normal saline today.  Continue nutrition supplements.  Follow-up with nutritionist. Cortisol level was checked previously and was normal.  I will repeat at the next visit.  #hyperglycemia due to DM and being on steroid.  Continue diabetic diet. Follow up with Dr.Jones for DM medication management.   All questions were answered. The patient knows to call the clinic with any problems questions or concerns.  cc [name], MD   Return of visit:   Follow-up  IVF 1L NS x 1.  IVF 1L NS x 1 in 1 week Lab encounter in 2 weeks, LFT, morning cortisol -morning lab before 10AM. + IVF x 1   Lab MD 4 weeks + IVF. Cbc cmp.  CT chest abdomen pelvis wo contrast   Earlie Server, MD, PhD 11/28/2021

## 2021-11-29 ENCOUNTER — Inpatient Hospital Stay: Payer: Medicaid Other

## 2021-11-29 VITALS — BP 144/79 | HR 75 | Temp 97.0°F

## 2021-11-29 DIAGNOSIS — C3432 Malignant neoplasm of lower lobe, left bronchus or lung: Secondary | ICD-10-CM | POA: Diagnosis not present

## 2021-11-29 DIAGNOSIS — C3492 Malignant neoplasm of unspecified part of left bronchus or lung: Secondary | ICD-10-CM

## 2021-11-29 MED ORDER — SODIUM CHLORIDE 0.9 % IV SOLN
Freq: Once | INTRAVENOUS | Status: AC
  Filled 2021-11-29: qty 250

## 2021-11-29 MED ORDER — HEPARIN SOD (PORK) LOCK FLUSH 100 UNIT/ML IV SOLN
INTRAVENOUS | Status: AC
  Filled 2021-11-29: qty 5

## 2021-11-29 MED ORDER — HEPARIN SOD (PORK) LOCK FLUSH 100 UNIT/ML IV SOLN
500.0000 [IU] | Freq: Once | INTRAVENOUS | Status: AC
  Administered 2021-11-29: 500 [IU] via INTRAVENOUS
  Filled 2021-11-29: qty 5

## 2021-11-29 NOTE — Progress Notes (Signed)
Nutrition Follow-up:  Patient with lung cancer.  Currently on surveillance.    Met with patient during fluids.  Patient reports poor appetite.  Taking marinol.  Says that the biggest thing effecting appetite is the smell of grease from cooking that aunt does daily.  Says that she is taking nausea medication.  Stays in room with door closed while she is cooking.  Says that she is trying to find other housing.  Likes instant potatoes, grilled cheese sandwiches.  Says that she has not received any glucerna shakes from [place] in about 3 months but has not called to check on them.      Medications: reviewed  Labs: reviewed  Anthropometrics:   Weight 97 lb 6/12  104 lb on 5/15 105 lb on 4/17 110 lb on 3/27 109 lb on 3/13 104 lb on 3/9 116 lb on 2/27 114 lb on 2/8   NUTRITION DIAGNOSIS: Inadequate oral intake continues    INTERVENTION:  Patient agreeable to RD contacting cancer center LCSW to see if any options for housing. Encouraged patient to call [employer] to reorder shakes.  She confirms that she has the number. Continue taking nausea medication. We talked about ways ventilate room to help with smells of food cooking.      MONITORING, EVALUATION, GOAL: weight trends, intake   NEXT VISIT: phone call July 10  Rogina Schiano B. Zenia Resides, [place], [place] Registered Dietitian [phone]

## 2021-12-01 ENCOUNTER — Other Ambulatory Visit: Payer: Self-pay

## 2021-12-01 ENCOUNTER — Encounter: Payer: Self-pay | Admitting: Licensed Clinical Social Worker

## 2021-12-01 DIAGNOSIS — C3492 Malignant neoplasm of unspecified part of left bronchus or lung: Secondary | ICD-10-CM

## 2021-12-01 NOTE — Progress Notes (Signed)
[place] Work  Clinical Social Work was referred by medical provider for assessment of psychosocial needs.  Clinical Social Worker contacted patient by phone  to offer support and assess for needs.  CSW was unable to leave voicemail, phone is not working.  CSW tried calling patient's contact but they did not answer the phone and voicemail was unavailable.  CSW will try contacting the patient again.   First Attempt  Adelene Amas, LCSW  Clinical Social Worker [place]        Patient is participating in a Managed Medicaid Plan:  Yes

## 2021-12-05 ENCOUNTER — Inpatient Hospital Stay: Payer: Medicaid Other

## 2021-12-05 ENCOUNTER — Ambulatory Visit: Payer: Medicaid Other

## 2021-12-05 VITALS — BP 168/88 | HR 90 | Temp 97.4°F | Resp 17

## 2021-12-05 DIAGNOSIS — C3432 Malignant neoplasm of lower lobe, left bronchus or lung: Secondary | ICD-10-CM | POA: Diagnosis not present

## 2021-12-05 DIAGNOSIS — E860 Dehydration: Secondary | ICD-10-CM

## 2021-12-05 DIAGNOSIS — C3492 Malignant neoplasm of unspecified part of left bronchus or lung: Secondary | ICD-10-CM

## 2021-12-05 MED ORDER — HEPARIN SOD (PORK) LOCK FLUSH 100 UNIT/ML IV SOLN
500.0000 [IU] | Freq: Once | INTRAVENOUS | Status: AC
  Administered 2021-12-05: 500 [IU] via INTRAVENOUS
  Filled 2021-12-05: qty 5

## 2021-12-05 MED ORDER — SODIUM CHLORIDE 0.9% FLUSH
10.0000 mL | Freq: Once | INTRAVENOUS | Status: AC
  Administered 2021-12-05: 10 mL via INTRAVENOUS
  Filled 2021-12-05: qty 10

## 2021-12-05 MED ORDER — SODIUM CHLORIDE 0.9 % IV SOLN
Freq: Once | INTRAVENOUS | Status: AC
  Filled 2021-12-05: qty 250

## 2021-12-05 NOTE — Progress Notes (Signed)
Pt received hydration today with 1 L NS. Denies pain. Eating well.

## 2021-12-12 ENCOUNTER — Ambulatory Visit: Payer: Medicaid Other

## 2021-12-12 ENCOUNTER — Inpatient Hospital Stay: Payer: Medicaid Other

## 2021-12-12 ENCOUNTER — Other Ambulatory Visit: Payer: Medicaid Other

## 2021-12-12 VITALS — BP 127/74 | HR 90 | Temp 97.7°F

## 2021-12-12 DIAGNOSIS — C3492 Malignant neoplasm of unspecified part of left bronchus or lung: Secondary | ICD-10-CM

## 2021-12-12 DIAGNOSIS — C3432 Malignant neoplasm of lower lobe, left bronchus or lung: Secondary | ICD-10-CM | POA: Diagnosis not present

## 2021-12-12 DIAGNOSIS — E860 Dehydration: Secondary | ICD-10-CM

## 2021-12-12 LAB — CORTISOL: Cortisol, Plasma: 7.4 ug/dL

## 2021-12-12 LAB — HEPATIC FUNCTION PANEL
ALT: 15 U/L (ref 0–44)
AST: 24 U/L (ref 15–41)
Albumin: 2.8 g/dL — ABNORMAL LOW (ref 3.5–5.0)
Alkaline Phosphatase: 111 U/L (ref 38–126)
Bilirubin, Direct: 0.1 mg/dL (ref 0.0–0.2)
Indirect Bilirubin: 0.1 mg/dL — ABNORMAL LOW (ref 0.3–0.9)
Total Bilirubin: 0.2 mg/dL — ABNORMAL LOW (ref 0.3–1.2)
Total Protein: 5.4 g/dL — ABNORMAL LOW (ref 6.5–8.1)

## 2021-12-12 MED ORDER — SODIUM CHLORIDE 0.9 % IV SOLN
Freq: Once | INTRAVENOUS | Status: AC
  Filled 2021-12-12: qty 250

## 2021-12-12 MED ORDER — SODIUM CHLORIDE 0.9% FLUSH
10.0000 mL | Freq: Once | INTRAVENOUS | Status: AC
  Administered 2021-12-12: 10 mL via INTRAVENOUS
  Filled 2021-12-12: qty 10

## 2021-12-12 MED ORDER — HEPARIN SOD (PORK) LOCK FLUSH 100 UNIT/ML IV SOLN
500.0000 [IU] | Freq: Once | INTRAVENOUS | Status: AC
  Administered 2021-12-12: 500 [IU] via INTRAVENOUS
  Filled 2021-12-12: qty 5

## 2021-12-15 ENCOUNTER — Inpatient Hospital Stay: Payer: Medicaid Other | Admitting: Licensed Clinical Social Worker

## 2021-12-15 DIAGNOSIS — C3492 Malignant neoplasm of unspecified part of left bronchus or lung: Secondary | ICD-10-CM

## 2021-12-15 NOTE — Progress Notes (Signed)
[place] Work  Initial Assessment   [name] [name] is a 66 y.o. year old female contacted by phone. Clinical Social Work was referred by medical provider for assessment of psychosocial needs.   SDOH (Social Determinants of Health) assessments performed: Yes SDOH Interventions    Flowsheet Row Most Recent Value  SDOH Interventions   Food Insecurity Interventions Intervention Not Indicated  Financial Strain Interventions Intervention Not Indicated  Housing Interventions Other (Comment)  [Patient is currently living with aunt, will apply to [place] for housing assistance.]  Stress Interventions Intervention Not Indicated, Provide Counseling  Social Connections Interventions Intervention Not Indicated       SDOH Screenings   Alcohol Screen: Not on file  Depression (PHQ2-9): Low Risk  (12/15/2021)   Depression (PHQ2-9)    PHQ-2 Score: 0  Financial Resource Strain: Low Risk  (12/15/2021)   Overall Financial Resource Strain (CARDIA)    Difficulty of Paying Living Expenses: Not very hard  Food Insecurity: No Food Insecurity (12/15/2021)   Hunger Vital Sign    Worried About Running Out of Food in the Last Year: Never true    Ran Out of Food in the Last Year: Never true  Housing: Medium Risk (12/15/2021)   Housing    Last Housing Risk Score: 1  Physical Activity: Not on file  Social Connections: Moderately Isolated (12/15/2021)   Social Connection and Isolation Panel [NHANES]    Frequency of Communication with Friends and Family: Twice a week    Frequency of Social Gatherings with Friends and Family: Twice a week    Attends Religious Services: Never    Marine scientist or Organizations: No    Attends Archivist Meetings: Never    Marital Status: Married  Stress: Stress Concern Present (12/15/2021)   [employer] [place]    Feeling of Stress : To some extent  Tobacco Use: Medium  Risk (11/28/2021)   Patient History    Smoking Tobacco Use: Former    Smokeless Tobacco Use: Never    Passive Exposure: Not on file  Transportation Needs: No Transportation Needs (11/28/2021)   PRAPARE - Hydrologist (Medical): No    Lack of Transportation (Non-Medical): No  Recent Concern: Transportation Needs - Unmet Transportation Needs (10/31/2021)   PRAPARE - Hydrologist (Medical): Yes    Lack of Transportation (Non-Medical): Yes     Distress Screen completed: No     No data to display            Family/Social Information:  Housing Arrangement: patient lives with aunt [name],[name] (Other) [phone]  , patient is looking for her own home, CSW recommended patient apply to [place] for housing assistance.  CSW did update patient average waiting time is 3-6 months. Family members/support persons in your life? Family, Friends, and Geographical information systems officer concerns: no, patient is currently using transportation services  Employment: Disabled .Marland Kitchen  Income source: Banker concerns:  No, patient stated she was doing well and did not have financial concerns Type of concern: None Food access concerns: no Religious or spiritual practice: Not known Services Currently in place:  Disability / Medicaid  Coping/ Adjustment to diagnosis: Patient understands treatment plan and what happens next? yes Concerns about diagnosis and/or treatment: I'm not especially worried about anything Patient reported stressors: Housing Hopes and/or priorities: to have her own home Patient enjoys music,  watching TV, and time with family/ friends Current coping skills/ strengths: Active sense of humor , Capable of independent living , Communication skills , Motivation for treatment/growth , and Supportive family/friends     SUMMARY: Current SDOH Barriers:  Financial constraints related to fixed  income, Limited social support, and Housing barriers  Clinical Social Work Clinical Goal(s):  Patient will follow up with [facility]* as directed by SW  Interventions: Discussed common feeling and emotions when being diagnosed with cancer, and the importance of support during treatment Informed patient of the support team roles and support services at [facility] Provided [place] contact information and encouraged patient to call with any questions or concerns Referred patient to [place] and Provided patient with information about CSW role in patient care, and available resources.  Patient stated her only concern was housing and will contact [facility] to discuss applying for [employer].  Patient stated she did not have any other concerns.    Follow Up Plan: Patient will contact CSW with any support or resource needs Patient verbalizes understanding of plan: Yes    Adelene Amas, LCSW   Patient is participating in a Managed Medicaid Plan:  Yes

## 2021-12-21 DIAGNOSIS — N3942 Incontinence without sensory awareness: Secondary | ICD-10-CM | POA: Diagnosis not present

## 2021-12-21 DIAGNOSIS — E119 Type 2 diabetes mellitus without complications: Secondary | ICD-10-CM | POA: Diagnosis not present

## 2021-12-21 DIAGNOSIS — C3492 Malignant neoplasm of unspecified part of left bronchus or lung: Secondary | ICD-10-CM | POA: Diagnosis not present

## 2021-12-22 ENCOUNTER — Ambulatory Visit
Admission: RE | Admit: 2021-12-22 | Discharge: 2021-12-22 | Disposition: A | Discharge status: Home / Self Care | Payer: Medicaid Other | Source: Ambulatory Visit | Attending: Oncology | Admitting: Oncology

## 2021-12-22 DIAGNOSIS — J90 Pleural effusion, not elsewhere classified: Secondary | ICD-10-CM | POA: Diagnosis not present

## 2021-12-22 DIAGNOSIS — K5730 Diverticulosis of large intestine without perforation or abscess without bleeding: Secondary | ICD-10-CM | POA: Diagnosis not present

## 2021-12-22 DIAGNOSIS — C3492 Malignant neoplasm of unspecified part of left bronchus or lung: Secondary | ICD-10-CM | POA: Diagnosis not present

## 2021-12-22 DIAGNOSIS — K6389 Other specified diseases of intestine: Secondary | ICD-10-CM | POA: Diagnosis not present

## 2021-12-22 DIAGNOSIS — N2889 Other specified disorders of kidney and ureter: Secondary | ICD-10-CM | POA: Diagnosis not present

## 2021-12-22 DIAGNOSIS — J432 Centrilobular emphysema: Secondary | ICD-10-CM | POA: Diagnosis not present

## 2021-12-26 ENCOUNTER — Inpatient Hospital Stay: Payer: Medicaid Other | Attending: Oncology

## 2021-12-26 ENCOUNTER — Inpatient Hospital Stay (HOSPITAL_BASED_OUTPATIENT_CLINIC_OR_DEPARTMENT_OTHER): Payer: Medicaid Other | Admitting: Oncology

## 2021-12-26 ENCOUNTER — Inpatient Hospital Stay: Payer: Medicaid Other

## 2021-12-26 ENCOUNTER — Encounter: Payer: Self-pay | Admitting: Oncology

## 2021-12-26 VITALS — BP 130/94 | HR 90 | Temp 96.1°F | Wt 101.0 lb

## 2021-12-26 DIAGNOSIS — Z902 Acquired absence of lung [part of]: Secondary | ICD-10-CM | POA: Insufficient documentation

## 2021-12-26 DIAGNOSIS — Z86711 Personal history of pulmonary embolism: Secondary | ICD-10-CM | POA: Diagnosis not present

## 2021-12-26 DIAGNOSIS — E440 Moderate protein-calorie malnutrition: Secondary | ICD-10-CM

## 2021-12-26 DIAGNOSIS — K449 Diaphragmatic hernia without obstruction or gangrene: Secondary | ICD-10-CM | POA: Diagnosis not present

## 2021-12-26 DIAGNOSIS — R7989 Other specified abnormal findings of blood chemistry: Secondary | ICD-10-CM | POA: Diagnosis not present

## 2021-12-26 DIAGNOSIS — J90 Pleural effusion, not elsewhere classified: Secondary | ICD-10-CM | POA: Insufficient documentation

## 2021-12-26 DIAGNOSIS — J432 Centrilobular emphysema: Secondary | ICD-10-CM | POA: Insufficient documentation

## 2021-12-26 DIAGNOSIS — Z888 Allergy status to other drugs, medicaments and biological substances status: Secondary | ICD-10-CM | POA: Diagnosis not present

## 2021-12-26 DIAGNOSIS — I700 Atherosclerosis of aorta: Secondary | ICD-10-CM | POA: Diagnosis not present

## 2021-12-26 DIAGNOSIS — E785 Hyperlipidemia, unspecified: Secondary | ICD-10-CM | POA: Diagnosis not present

## 2021-12-26 DIAGNOSIS — Z89511 Acquired absence of right leg below knee: Secondary | ICD-10-CM | POA: Diagnosis not present

## 2021-12-26 DIAGNOSIS — Z86718 Personal history of other venous thrombosis and embolism: Secondary | ICD-10-CM | POA: Diagnosis not present

## 2021-12-26 DIAGNOSIS — R5383 Other fatigue: Secondary | ICD-10-CM | POA: Diagnosis not present

## 2021-12-26 DIAGNOSIS — C3492 Malignant neoplasm of unspecified part of left bronchus or lung: Secondary | ICD-10-CM

## 2021-12-26 DIAGNOSIS — K5730 Diverticulosis of large intestine without perforation or abscess without bleeding: Secondary | ICD-10-CM | POA: Diagnosis not present

## 2021-12-26 DIAGNOSIS — I129 Hypertensive chronic kidney disease with stage 1 through stage 4 chronic kidney disease, or unspecified chronic kidney disease: Secondary | ICD-10-CM | POA: Insufficient documentation

## 2021-12-26 DIAGNOSIS — Z87891 Personal history of nicotine dependence: Secondary | ICD-10-CM | POA: Insufficient documentation

## 2021-12-26 DIAGNOSIS — N1832 Chronic kidney disease, stage 3b: Secondary | ICD-10-CM

## 2021-12-26 DIAGNOSIS — E876 Hypokalemia: Secondary | ICD-10-CM | POA: Insufficient documentation

## 2021-12-26 DIAGNOSIS — E1122 Type 2 diabetes mellitus with diabetic chronic kidney disease: Secondary | ICD-10-CM | POA: Diagnosis not present

## 2021-12-26 DIAGNOSIS — I252 Old myocardial infarction: Secondary | ICD-10-CM | POA: Insufficient documentation

## 2021-12-26 DIAGNOSIS — D649 Anemia, unspecified: Secondary | ICD-10-CM | POA: Diagnosis not present

## 2021-12-26 DIAGNOSIS — F32A Depression, unspecified: Secondary | ICD-10-CM | POA: Insufficient documentation

## 2021-12-26 DIAGNOSIS — C3432 Malignant neoplasm of lower lobe, left bronchus or lung: Secondary | ICD-10-CM | POA: Insufficient documentation

## 2021-12-26 DIAGNOSIS — C3490 Malignant neoplasm of unspecified part of unspecified bronchus or lung: Secondary | ICD-10-CM | POA: Diagnosis not present

## 2021-12-26 DIAGNOSIS — Z79899 Other long term (current) drug therapy: Secondary | ICD-10-CM | POA: Insufficient documentation

## 2021-12-26 LAB — COMPREHENSIVE METABOLIC PANEL
ALT: 12 U/L (ref 0–44)
AST: 27 U/L (ref 15–41)
Albumin: 3 g/dL — ABNORMAL LOW (ref 3.5–5.0)
Alkaline Phosphatase: 112 U/L (ref 38–126)
Anion gap: 6 (ref 5–15)
BUN: 18 mg/dL (ref 8–23)
CO2: 29 mmol/L (ref 22–32)
Calcium: 8.4 mg/dL — ABNORMAL LOW (ref 8.9–10.3)
Chloride: 102 mmol/L (ref 98–111)
Creatinine, Ser: 1.17 mg/dL — ABNORMAL HIGH (ref 0.44–1.00)
GFR, Estimated: 52 mL/min — ABNORMAL LOW (ref >60)
Glucose, Bld: 81 mg/dL (ref 70–99)
Potassium: 2.6 mmol/L — CL (ref 3.5–5.1)
Sodium: 137 mmol/L (ref 135–145)
Total Bilirubin: 0.5 mg/dL (ref 0.3–1.2)
Total Protein: 5.9 g/dL — ABNORMAL LOW (ref 6.5–8.1)

## 2021-12-26 LAB — CBC WITH DIFFERENTIAL/PLATELET
Abs Immature Granulocytes: 0.01 10*3/uL (ref 0.00–0.07)
Basophils Absolute: 0 10*3/uL (ref 0.0–0.1)
Basophils Relative: 1 %
Eosinophils Absolute: 0.1 10*3/uL (ref 0.0–0.5)
Eosinophils Relative: 3 %
HCT: 30.1 % — ABNORMAL LOW (ref 36.0–46.0)
Hemoglobin: 9.5 g/dL — ABNORMAL LOW (ref 12.0–15.0)
Immature Granulocytes: 0 %
Lymphocytes Relative: 27 %
Lymphs Abs: 1 10*3/uL (ref 0.7–4.0)
MCH: 28.9 pg (ref 26.0–34.0)
MCHC: 31.6 g/dL (ref 30.0–36.0)
MCV: 91.5 fL (ref 80.0–100.0)
Monocytes Absolute: 0.4 10*3/uL (ref 0.1–1.0)
Monocytes Relative: 10 %
Neutro Abs: 2.2 10*3/uL (ref 1.7–7.7)
Neutrophils Relative %: 59 %
Platelets: 389 10*3/uL (ref 150–400)
RBC: 3.29 MIL/uL — ABNORMAL LOW (ref 3.87–5.11)
RDW: 14.4 % (ref 11.5–15.5)
WBC: 3.7 10*3/uL — ABNORMAL LOW (ref 4.0–10.5)
nRBC: 0 % (ref 0.0–0.2)

## 2021-12-26 MED ORDER — POTASSIUM CHLORIDE 20 MEQ/100ML IV SOLN
20.0000 meq | Freq: Once | INTRAVENOUS | Status: AC
  Administered 2021-12-26: 20 meq via INTRAVENOUS

## 2021-12-26 MED ORDER — SODIUM CHLORIDE 0.9 % IV SOLN
Freq: Once | INTRAVENOUS | Status: AC
  Filled 2021-12-26: qty 250

## 2021-12-26 MED ORDER — SODIUM CHLORIDE 0.9% FLUSH
10.0000 mL | Freq: Once | INTRAVENOUS | Status: AC
  Administered 2021-12-26: 10 mL via INTRAVENOUS
  Filled 2021-12-26: qty 10

## 2021-12-26 MED ORDER — POTASSIUM CHLORIDE CRYS ER 20 MEQ PO TBCR: 20.0000 meq | EXTENDED_RELEASE_TABLET | Freq: Every day | ORAL | 0 refills | Status: DC

## 2021-12-26 MED ORDER — HEPARIN SOD (PORK) LOCK FLUSH 100 UNIT/ML IV SOLN
500.0000 [IU] | Freq: Once | INTRAVENOUS | Status: AC
  Administered 2021-12-26: 500 [IU] via INTRAVENOUS
  Filled 2021-12-26: qty 5

## 2021-12-26 NOTE — Assessment & Plan Note (Addendum)
IV potassium chloride 20meq x1 today. Recommend patient to take potassium 71meq daily-2-week supply.   Repeat BMP in 3-4 weeks.

## 2021-12-26 NOTE — Assessment & Plan Note (Signed)
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  Status post left lower lobectomy and lymph node dissection pT1c pN2 [pathology addendum changed to N2]  She has negative surgical margin, no clear benefit of postoperative RT.  S/p 4 cycles of adjuvant chemotherapy Carboplatin and Taxol.  S/p 1 cycle of Tecentriq, Tecentriq was discontinued due to severe immunotherapy induced liver toxicity Labs reviewed and discussed with patient.  Continue observation.

## 2021-12-26 NOTE — Assessment & Plan Note (Signed)
Patient has gained weight.  Continue follow-up with nutritionist  Continue nutrition supplements.

## 2021-12-26 NOTE — Assessment & Plan Note (Signed)
Encourage oral hydration. Avoid nephrotoxins.

## 2021-12-26 NOTE — Progress Notes (Signed)
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  Poorly differentiated adenocarcinoma.  Status post left lower lobectomy and lymph node dissection pT1c pN2 [pathology addendum changed to N2]  She has negative surgical margin, no clear benefit of postoperative RT.  S/p 4 cycles of adjuvant chemotherapy Carboplatin and Taxol.  S/p 1 cycle of Tecentriq, Tecentriq was discontinued due to severe immunotherapy induced liver toxicity Labs reviewed and discussed with patient.  Continue observation.  Liver function test abnormality Liver function normalized.  Stage 3b chronic kidney disease ([place]) Encourage oral hydration. Avoid nephrotoxins.  Hypokalemia IV potassium chloride 55mq x1 today. Recommend patient to take potassium 245m daily-2-week supply.   Repeat BMP in 3-4 weeks.  Normocytic anemia Chronic anemia, hemoglobin stable.  Moderate protein-calorie malnutrition (HCClevelandPatient has gained weight.  Continue follow-up with nutritionist  Continue nutrition supplements.  Orders Placed This Encounter  Procedures   MR Brain W Wo Contrast    Standing Status:   Future    Standing Expiration Date:   12/26/2022    Order Specific Question:   If indicated for the ordered procedure, I authorize the administration of contrast media per Radiology protocol    Answer:   Yes    Order Specific Question:   What is the patient's sedation requirement?    Answer:   No Sedation    Order Specific Question:   Does the patient have a pacemaker or implanted devices?    Answer:   No    Order Specific Question:   Use SRS Protocol?    Answer:   Yes    Order Specific Question:   [facility]    [facility]   CT CHEST ABDOMEN PELVIS W CONTRAST    Standing Status:   Future    Standing Expiration Date:   12/27/2022    Order Specific Question:   Preferred imaging location?    Answer:   [facility]    Order Specific Question:   Is Oral Contrast requested for this exam?    Answer:   Yes, Per Radiology protocol   Comprehensive metabolic panel    Standing Status:   Future    Standing Expiration Date:   12/27/2022   CBC with Differential/Platelet    Standing Status:   Future    Standing Expiration Date:   12/27/2022   Comprehensive metabolic panel    Standing Status:   Future    Standing Expiration Date:   12/27/2022      Follow-up MRI brain with and without contrast-next available CT chest abdomen pelvis Lab in 4 weeks Lab cbc cmp  3 months Prior to MD.   All questions were answered. The patient knows to call the clinic with any problems, questions or concerns.  ZhEarlie ServerMD, PhD [facility] 12/26/2021     CHIEF COMPLAINTS/REASON FOR VISIT:  Follow up for stage IIIA lung cancer  HISTORY OF PRESENTING ILLNESS:  Patient presents for follow-up of stage III lung adenocarcinoma. Oncology history listed as below. Oncology History  [place] Diagnosis   Non-small cell lung cancer   -11/17/2020, CT hematuria work-up was obtained which showed no evidence of  urinary tract calculus or hydronephrosis.  Small left renal cyst. Incidental findings of 1.9 x 1.2 cm left lower lobe pulmonary nodule worrisome for malignancy. -12/07/2020, PET scan showed left lower lobe 1.3 x 1.8 cm lung nodule with SUV of 6.5.  Suspect early stage primary lung neoplasm.  No hilar or mediastinal lymphadenopathy activity. - 12/23/2020 S/p CT guided biopsy of left lower lobe mass.  Pathology showed non-small cell carcinoma, favor adenocarcinoma.  Positive for TTF-1, negative for p40.     01/28/2021 Surgery   - left  lower lobectomy with lymph node dissection. Pathology showed invasive poorly differentiated adenocarcinoma, 2.2 cm.  Tumor abuts pleura but visceral pleural surface is not involved by carcinoma.  LVI invasion is present, resection margins are negative for carcinoma.  10 lymph nodes were harvested and 3 lymph nodes were involved with carcinoma. pT1c pN2 Patient's case was discussed at [facility] oncology tumor board.  And consensus recommendation was adjuvant chemotherapy  Foundation one testing showed PD-L1 1, No reportable alterations   01/28/2021 Cancer Staging   Staging form: Lung, AJCC 8th Edition - Pathologic stage from 01/28/2021: Stage IIIA (pT1c, pN2, cM0) - Signed by Earlie Server, MD on 02/17/2021 Stage prefix: Initial diagnosis Residual tumor (R): R0 - None   02/28/2021 - 05/19/2021 Chemotherapy   Patient is on Treatment Plan : LUNG Carboplatin + Paclitaxel q21d     07/07/2021 - 07/07/2021 Chemotherapy   Atezolizumab q21d x 12 dose. She developed gade 4 liver toxicity. Treatment was discontinued.    12/22/2021 Imaging   CT chest abdomen pelvis wo contrast 1. Similar surgical changes of left lower lobectomy without evidence of recurrent disease. 2. No evidence of metastatic disease in the chest, abdomen or pelvis on this noncontrast enhanced CT. 3. Colonic diverticulosis without findings of acute diverticulitis.4.  Aortic Atherosclerosis (ICD10-I70.0).       INTERVAL HISTORY [name] [name] is a 66 y.o. female who has above history reviewed by me today presents for follow up visit for Stage IIIA lung cancer.  Patient reports feeling well.  She takes nutrition supplementation.  Weight has improved. No new complaints.  Denies any nausea vomiting diarrhea.   Review of Systems  Constitutional:  Positive for fatigue. Negative for appetite change, chills, fever and unexpected weight change.  HENT:   Negative for hearing loss and voice change.   Eyes:  Negative for eye problems.  Respiratory:   Negative for chest tightness and cough.   Cardiovascular:  Negative for chest pain.  Gastrointestinal:  Negative for abdominal distention, abdominal pain, blood in stool, diarrhea and nausea.  Endocrine: Negative for hot flashes.  Genitourinary:  Negative for difficulty urinating, dysuria and frequency.   Musculoskeletal:  Negative for arthralgias.  Skin:  Negative for itching and rash.  Neurological:  Negative for extremity weakness and light-headedness.  Hematological:  Negative for adenopathy.  Psychiatric/Behavioral:  Negative for confusion.     MEDICAL HISTORY:  Past Medical History:  Diagnosis Date   Anemia    Cancer ([place])    CHF (congestive heart failure) ([place])    02/16/20 HFrEF 20-25% in setting of acute DVT/PE, underlying CAD   CKD (chronic kidney disease)    Coronary artery disease    02/20/20: CTO pRCA with left-to-right collaterals, 50% mLAD, 80% OM1, medical therapy   Depression    Diabetes ([place])    Diabetes mellitus without complication ([place])    DVT (deep venous thrombosis) ([place]) 02/16/2020   RLE DVT proximal veins 02/16/20 Duplex   Hyperlipidemia  Hypertension    Myocardial infarction [facility]) 2009   Stress related per patient; NSTEMI 2012 100% RCA with left-to-right collaterals   PE (pulmonary thromboembolism) ([place]) 02/15/2020   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, RLE BKA   Peripheral artery disease ([place])     SURGICAL HISTORY: Past Surgical History:  Procedure Laterality Date   IR IMAGING GUIDED PORT INSERTION  07/27/2021   Right lower extremity BKA Right     SOCIAL HISTORY: Social History   Socioeconomic History   Marital status: Married    Spouse name: Not on file   Number of children: Not on file   Years of education: Not on file   Highest education level: Not on file  Occupational History   Not on file  Tobacco Use   Smoking status: Former    Packs/day: 0.25    Types: Cigars, Cigarettes    Quit date: 01/17/2021    Years since  quitting: 0.9   Smokeless tobacco: Never  Vaping Use   Vaping Use: Never used  Substance and Sexual Activity   Alcohol use: Never   Drug use: Not Currently   Sexual activity: Not Currently  Other Topics Concern   Not on file  Social History Narrative   Not on file   Social Determinants of Health   Financial Resource Strain: Low Risk  (12/15/2021)   Overall Financial Resource Strain (CARDIA)    Difficulty of Paying Living Expenses: Not very hard  Food Insecurity: No Food Insecurity (12/15/2021)   Hunger Vital Sign    Worried About Running Out of Food in the Last Year: Never true    Ran Out of Food in the Last Year: Never true  Transportation Needs: No Transportation Needs (11/28/2021)   PRAPARE - Hydrologist (Medical): No    Lack of Transportation (Non-Medical): No  Recent Concern: Transportation Needs - Unmet Transportation Needs (10/31/2021)   PRAPARE - Transportation    Lack of Transportation (Medical): Yes    Lack of Transportation (Non-Medical): Yes  Physical Activity: Not on file  Stress: Stress Concern Present (12/15/2021)   [place]    Feeling of Stress : To some extent  Social Connections: Moderately Isolated (12/15/2021)   Social Connection and Isolation Panel [NHANES]    Frequency of Communication with Friends and Family: Twice a week    Frequency of Social Gatherings with Friends and Family: Twice a week    Attends Religious Services: Never    Marine scientist or Organizations: No    Attends Music therapist: Never    Marital Status: Married  Human resources officer Violence: Not on file    FAMILY HISTORY: Family History  Problem Relation Age of Onset   Heart disease Mother    Diabetes Mother    Heart disease Father    Diabetes Father     ALLERGIES:  is allergic to lisinopril.  MEDICATIONS:  Current Outpatient Medications  Medication Sig Dispense  Refill   ACCU-CHEK GUIDE test strip USE UP TO 4 TIMES A DAY AS DIRECTED 100 strip 1   Accu-Chek Softclix Lancets lancets SMARTSIG:Topical 1-4 Times Daily     aspirin EC 81 MG tablet Take 81 mg by mouth in the morning. Swallow whole.     blood glucose meter kit and supplies KIT Dispense based on patient and insurance preference. Use up to four times daily as directed. 1 each 0   carboxymethylcellulose (  REFRESH PLUS) 0.5 % SOLN Place 1 drop into both eyes in the morning and at bedtime.     diphenhydrAMINE (BENADRYL) 25 MG tablet Take 25 mg by mouth at bedtime.     docusate sodium (COLACE) 100 MG capsule Take 1 capsule (100 mg total) by mouth 2 (two) times daily. 60 capsule 1   DULoxetine (CYMBALTA) 20 MG capsule SMARTSIG:2 Capsule(s) By Mouth Morning-Night     empagliflozin (JARDIANCE) 10 MG TABS tablet Take 1 tablet (10 mg total) by mouth daily. 90 tablet 3   gabapentin (NEURONTIN) 100 MG capsule Take 1 capsule (100 mg total) by mouth 2 (two) times daily. 60 capsule 5   HUMALOG KWIKPEN 100 UNIT/ML KwikPen Inject 8 Units into the skin 3 (three) times daily after meals. 15 mL 11   isosorbide dinitrate (ISORDIL) 30 MG tablet Take 1 tablet (30 mg total) by mouth 3 (three) times daily. 270 tablet 1   ketoconazole (NIZORAL) 2 % cream APPLY 1 APPLICATION TOPICALLY IN THE MORNING AND AT BEDTIME. APPLIED TO FEET 60 g 1   LANTUS SOLOSTAR 100 UNIT/ML Solostar Pen PLEASE INJECT 16 UNITS UNDER THE SKIN EACH NIGHT 15 mL 0   lidocaine-prilocaine (EMLA) cream Apply 1 application topically as needed. Apply to port and cover with saran wrap 1-2 hours prior to port access 30 g 1   metFORMIN (GLUCOPHAGE-XR) 500 MG 24 hr tablet TAKE 1 TABLET BY MOUTH TWICE A DAY 180 tablet 2   metoprolol succinate (TOPROL-XL) 50 MG 24 hr tablet Take 1 tablet (50 mg total) by mouth daily. Take with or immediately following a meal. 90 tablet 1   Multiple Vitamin (MULTIVITAMIN WITH MINERALS) TABS tablet Take 1 tablet by mouth in the  morning. Adults 50+     ondansetron (ZOFRAN) 8 MG tablet Take 1 tablet (8 mg total) by mouth 2 (two) times daily as needed (Nausea or vomiting). Start if needed on the third day after cisplatin. 30 tablet 1   pantoprazole (PROTONIX) 40 MG tablet Take 1 tablet (40 mg total) by mouth daily. 30 tablet 2   potassium chloride SA (KLOR-CON M) 20 MEQ tablet Take 1 tablet (20 mEq total) by mouth daily. 14 tablet 0   prochlorperazine (COMPAZINE) 10 MG tablet TAKE 1 TABLET (10 MG TOTAL) BY MOUTH EVERY 6 (SIX) HOURS AS NEEDED (NAUSEA OR VOMITING). 30 tablet 1   spironolactone (ALDACTONE) 25 MG tablet Take 1 tablet (25 mg total) by mouth daily. 90 tablet 1   No current facility-administered medications for this visit.   Facility-Administered Medications Ordered in Other Visits  Medication Dose Route Frequency Provider Last Rate Last Admin   heparin lock flush 100 UNIT/ML injection            heparin lock flush 100 unit/mL  500 Units Intravenous Once Earlie Server, MD         PHYSICAL EXAMINATION: ECOG PERFORMANCE STATUS: 1 - Symptomatic but completely ambulatory Vitals:   12/26/21 0954  BP: (!) 130/94  Pulse: 90  Temp: (!) 96.1 F (35.6 C)   Filed Weights   12/26/21 0954  Weight: 101 lb (45.8 kg)    Physical Exam Constitutional:      General: She is not in acute distress.    Appearance: She is not ill-appearing.  HENT:     Head: Normocephalic and atraumatic.  Eyes:     General: No scleral icterus. Cardiovascular:     Rate and Rhythm: Normal rate and regular rhythm.     Heart sounds:  Normal heart sounds.  Pulmonary:     Effort: Pulmonary effort is normal. No respiratory distress.     Breath sounds: No wheezing.     Comments: Absent breath sounds left basilar Abdominal:     General: Bowel sounds are normal. There is no distension.     Palpations: Abdomen is soft.  Musculoskeletal:        General: No deformity. Normal range of motion.     Cervical back: Normal range of motion and neck  supple.     Comments: Right lower extremity BKA  Skin:    General: Skin is warm and dry.     Findings: No erythema or rash.  Neurological:     Mental Status: She is alert and oriented to person, place, and time. Mental status is at baseline.     Cranial Nerves: No cranial nerve deficit.     Coordination: Coordination normal.  Psychiatric:        Mood and Affect: Mood normal.     LABORATORY DATA:  I have reviewed the data as listed     Latest Ref Rng & Units 12/26/2021    9:27 AM 11/28/2021    2:12 PM 10/31/2021    9:20 AM  CBC  WBC 4.0 - 10.5 K/uL 3.7  5.4  5.4   Hemoglobin 12.0 - 15.0 g/dL 9.5  9.4  9.8   Hematocrit 36.0 - 46.0 % 30.1  30.4  30.6   Platelets 150 - 400 K/uL 389  408  372       Latest Ref Rng & Units 12/26/2021    9:27 AM 12/12/2021    9:10 AM 11/28/2021    2:12 PM  CMP  Glucose 70 - 99 mg/dL 81   386   BUN 8 - 23 mg/dL 18   21   Creatinine 0.44 - 1.00 mg/dL 1.17   1.59   Sodium 135 - 145 mmol/L 137   134   Potassium 3.5 - 5.1 mmol/L 2.6   5.0   Chloride 98 - 111 mmol/L 102   108   CO2 22 - 32 mmol/L 29   22   Calcium 8.9 - 10.3 mg/dL 8.4   8.1   Total Protein 6.5 - 8.1 g/dL 5.9  5.4  5.9   Total Bilirubin 0.3 - 1.2 mg/dL 0.5  0.2  0.4   Alkaline Phos 38 - 126 U/L 112  111  205   AST 15 - 41 U/L 27  24  38   ALT 0 - 44 U/L _0 RADIOGRAPHIC STUDIES: I have personally reviewed the radiological images as listed and agreed with the findings in the report. CT CHEST ABDOMEN PELVIS WO CONTRAST  Result Date: 12/22/2021 CLINICAL DATA:  History of lung cancer status post left lower lobe wedge resection. Chemotherapy complete November 2022. Restaging/follow-up. * Tracking Code: BO * EXAM: CT CHEST, ABDOMEN AND PELVIS WITHOUT CONTRAST TECHNIQUE: Multidetector CT imaging of the chest, abdomen and pelvis was performed following the standard protocol without IV contrast. RADIATION DOSE REDUCTION: This exam was performed according to the departmental  dose-optimization program which includes automated exposure control, adjustment of the mA and/or kV according to patient size and/or use of iterative reconstruction technique. COMPARISON:  Multiple priors including most recent chest CT September 27, 2021 PET-CT December 07, 2020 and CT abdomen pelvis November 17, 2020 FINDINGS: CT CHEST FINDINGS Cardiovascular: Right chest Port-A-Cath with tip near the superior cavoatrial  junction. Aortic atherosclerosis without aneurysmal dilation. Normal size heart. Coronary artery calcifications. No significant pericardial effusion/thickening. Mediastinum/Nodes: No suspicious thyroid nodule. No pathologically enlarged mediastinal, hilar or axillary lymph nodes, noting limited sensitivity for the detection of hilar adenopathy on this noncontrast study. Patulous esophagus. Lungs/Pleura: Similar postsurgical changes of left lower lobectomy. Similar trace loculated left pleural effusion. Similar scarring in the paramedian right upper lobe for instance on image 40/4. Mild centrilobular emphysema. No new suspicious pulmonary nodules or masses. Musculoskeletal: No chest wall mass or suspicious bone lesions identified. CT ABDOMEN PELVIS FINDINGS Hepatobiliary: Unremarkable noncontrast enhanced appearance of the hepatic parenchyma. Gallbladder surgically absent. No biliary ductal dilation. Pancreas: No pancreatic ductal dilation or evidence of acute inflammation. Spleen: No splenomegaly. Adrenals/Urinary Tract: Bilateral adrenal glands appear normal. No hydronephrosis. 12 mm left interpolar renal lesion previously characterized as a cyst on contrasted multiphase CT from June 19, 2020 and requiring no independent imaging follow-up. Urinary bladder is unremarkable. Stomach/Bowel: Radiopaque enteric contrast material traverses the hepatic flexure. Small hiatal hernia otherwise the stomach is unremarkable for degree of distension. No pathologic dilation of small or large bowel. The appendix and terminal  ileum appear normal. Moderate volume of formed stool throughout the colon suggestive of constipation. Colonic diverticulosis without findings of acute diverticulitis. Vascular/Lymphatic: Aortic atherosclerosis. No pathologically enlarged abdominal or pelvic lymph nodes. Reproductive: Uterus and bilateral adnexa are unremarkable. Other: No significant abdominopelvic free fluid. Musculoskeletal: No aggressive lytic or blastic lesion of bone. Multilevel degenerative changes spine. Diffuse demineralization of bone. Degenerative changes bilateral hips. IMPRESSION: 1. Similar surgical changes of left lower lobectomy without evidence of recurrent disease. 2. No evidence of metastatic disease in the chest, abdomen or pelvis on this noncontrast enhanced CT. 3. Colonic diverticulosis without findings of acute diverticulitis. 4.  Aortic Atherosclerosis (ICD10-I70.0). Electronically Signed   By: Dahlia Bailiff M.D.   On: 12/22/2021 14:00

## 2021-12-26 NOTE — Assessment & Plan Note (Signed)
Chronic anemia, hemoglobin stable.

## 2021-12-26 NOTE — Assessment & Plan Note (Signed)
Liver function normalized.

## 2021-12-31 ENCOUNTER — Ambulatory Visit
Admission: RE | Admit: 2021-12-31 | Discharge: 2021-12-31 | Disposition: A | Discharge status: Home / Self Care | Payer: Medicaid Other | Source: Ambulatory Visit | Attending: Oncology | Admitting: Oncology

## 2021-12-31 DIAGNOSIS — C3490 Malignant neoplasm of unspecified part of unspecified bronchus or lung: Secondary | ICD-10-CM | POA: Diagnosis not present

## 2021-12-31 DIAGNOSIS — C3492 Malignant neoplasm of unspecified part of left bronchus or lung: Secondary | ICD-10-CM

## 2021-12-31 MED ORDER — GADOBUTROL 1 MMOL/ML IV SOLN
4.0000 mL | Freq: Once | INTRAVENOUS | Status: AC | PRN
  Administered 2021-12-31: 7.5 mL via INTRAVENOUS

## 2022-01-04 ENCOUNTER — Inpatient Hospital Stay: Payer: Medicaid Other

## 2022-01-04 NOTE — Progress Notes (Signed)
Nutrition Follow-up:  Patient with lung cancer.  Currently on surveillance.  Spoke with patient via phone.  Patient reports that appetite is up a little bit.  Less nausea.  Eating more fresh vegetables (cucumbers, corn).  Had cheese toast and steak, egg and cheese biscuit  yesterday.  Drinking glucerna (strawberry and chocolate).    Says that she has sent in application for [place].      Medications: reviewed  Labs: reviewed  Anthropometrics:   Weight 101 lb 97 lb on 6/12 104 lb on 5/15 105 lb on 4/17 110 lb on 3/27  NUTRITION DIAGNOSIS: Inadequate oral intake improving   INTERVENTION:  Encouraged patient to continue eating well to continue to gain weight    MONITORING, EVALUATION, GOAL: weight trends, intake   NEXT VISIT: Friday, October 13th after MD visit  [name] [name], [place], [place] Registered Dietitian [phone]

## 2022-01-05 ENCOUNTER — Encounter: Payer: Self-pay | Admitting: Oncology

## 2022-01-19 ENCOUNTER — Other Ambulatory Visit: Payer: Self-pay

## 2022-01-19 DIAGNOSIS — C3490 Malignant neoplasm of unspecified part of unspecified bronchus or lung: Secondary | ICD-10-CM

## 2022-01-23 ENCOUNTER — Other Ambulatory Visit: Payer: Self-pay

## 2022-01-23 ENCOUNTER — Inpatient Hospital Stay: Payer: Medicaid Other

## 2022-01-23 ENCOUNTER — Inpatient Hospital Stay: Payer: Medicaid Other | Attending: Oncology

## 2022-01-23 DIAGNOSIS — C3490 Malignant neoplasm of unspecified part of unspecified bronchus or lung: Secondary | ICD-10-CM

## 2022-01-23 DIAGNOSIS — C3432 Malignant neoplasm of lower lobe, left bronchus or lung: Secondary | ICD-10-CM | POA: Insufficient documentation

## 2022-01-23 DIAGNOSIS — C3492 Malignant neoplasm of unspecified part of left bronchus or lung: Secondary | ICD-10-CM

## 2022-01-23 LAB — COMPREHENSIVE METABOLIC PANEL
ALT: 17 U/L (ref 0–44)
AST: 32 U/L (ref 15–41)
Albumin: 2.9 g/dL — ABNORMAL LOW (ref 3.5–5.0)
Alkaline Phosphatase: 116 U/L (ref 38–126)
Anion gap: 8 (ref 5–15)
BUN: 30 mg/dL — ABNORMAL HIGH (ref 8–23)
CO2: 24 mmol/L (ref 22–32)
Calcium: 8.6 mg/dL — ABNORMAL LOW (ref 8.9–10.3)
Chloride: 107 mmol/L (ref 98–111)
Creatinine, Ser: 1.36 mg/dL — ABNORMAL HIGH (ref 0.44–1.00)
GFR, Estimated: 44 mL/min — ABNORMAL LOW (ref >60)
Glucose, Bld: 158 mg/dL — ABNORMAL HIGH (ref 70–99)
Potassium: 4 mmol/L (ref 3.5–5.1)
Sodium: 139 mmol/L (ref 135–145)
Total Bilirubin: 0.3 mg/dL (ref 0.3–1.2)
Total Protein: 6 g/dL — ABNORMAL LOW (ref 6.5–8.1)

## 2022-01-23 LAB — CBC
HCT: 29.7 % — ABNORMAL LOW (ref 36.0–46.0)
Hemoglobin: 9.2 g/dL — ABNORMAL LOW (ref 12.0–15.0)
MCH: 28.3 pg (ref 26.0–34.0)
MCHC: 31 g/dL (ref 30.0–36.0)
MCV: 91.4 fL (ref 80.0–100.0)
Platelets: 394 10*3/uL (ref 150–400)
RBC: 3.25 MIL/uL — ABNORMAL LOW (ref 3.87–5.11)
RDW: 14.6 % (ref 11.5–15.5)
WBC: 4.6 10*3/uL (ref 4.0–10.5)
nRBC: 0 % (ref 0.0–0.2)

## 2022-02-02 ENCOUNTER — Other Ambulatory Visit: Payer: Self-pay | Admitting: Oncology

## 2022-02-02 DIAGNOSIS — Z7952 Long term (current) use of systemic steroids: Secondary | ICD-10-CM

## 2022-02-06 DIAGNOSIS — E119 Type 2 diabetes mellitus without complications: Secondary | ICD-10-CM | POA: Diagnosis not present

## 2022-02-06 DIAGNOSIS — N3942 Incontinence without sensory awareness: Secondary | ICD-10-CM | POA: Diagnosis not present

## 2022-02-09 ENCOUNTER — Other Ambulatory Visit: Payer: Self-pay | Admitting: Family Medicine

## 2022-02-09 DIAGNOSIS — E7801 Familial hypercholesterolemia: Secondary | ICD-10-CM

## 2022-02-10 DIAGNOSIS — E119 Type 2 diabetes mellitus without complications: Secondary | ICD-10-CM | POA: Diagnosis not present

## 2022-02-10 DIAGNOSIS — C3492 Malignant neoplasm of unspecified part of left bronchus or lung: Secondary | ICD-10-CM | POA: Diagnosis not present

## 2022-02-14 ENCOUNTER — Telehealth: Payer: Self-pay

## 2022-02-14 ENCOUNTER — Telehealth: Payer: Self-pay | Admitting: Family Medicine

## 2022-02-14 NOTE — Telephone Encounter (Signed)
Copied from [place] [id number]. Topic: Referral - Status >> Feb 14, 2022  1:51 PM Ja-Kwan M wrote: Reason for CRM: Pt called to get the location and phone number of the diabetes doctor that she was referred to in [place]. Cb# [phone]

## 2022-02-14 NOTE — Telephone Encounter (Signed)
Called pt and left vm with cardio [phone] Lars Masson, Nephrology [phone]/ Sherlynn Stalls, and endocrinology [place] all numbers and names

## 2022-02-15 LAB — HM DIABETES EYE EXAM

## 2022-02-16 DIAGNOSIS — B353 Tinea pedis: Secondary | ICD-10-CM | POA: Diagnosis not present

## 2022-02-16 DIAGNOSIS — Z89511 Acquired absence of right leg below knee: Secondary | ICD-10-CM | POA: Diagnosis not present

## 2022-02-16 DIAGNOSIS — I2510 Atherosclerotic heart disease of native coronary artery without angina pectoris: Secondary | ICD-10-CM | POA: Diagnosis not present

## 2022-02-16 DIAGNOSIS — I82401 Acute embolism and thrombosis of unspecified deep veins of right lower extremity: Secondary | ICD-10-CM | POA: Diagnosis not present

## 2022-02-16 DIAGNOSIS — I10 Essential (primary) hypertension: Secondary | ICD-10-CM | POA: Diagnosis not present

## 2022-02-16 DIAGNOSIS — E1151 Type 2 diabetes mellitus with diabetic peripheral angiopathy without gangrene: Secondary | ICD-10-CM | POA: Diagnosis not present

## 2022-02-16 DIAGNOSIS — B351 Tinea unguium: Secondary | ICD-10-CM | POA: Diagnosis not present

## 2022-02-16 DIAGNOSIS — I739 Peripheral vascular disease, unspecified: Secondary | ICD-10-CM | POA: Diagnosis not present

## 2022-02-16 DIAGNOSIS — E119 Type 2 diabetes mellitus without complications: Secondary | ICD-10-CM | POA: Diagnosis not present

## 2022-03-03 ENCOUNTER — Ambulatory Visit: Payer: Medicaid Other | Admitting: Family Medicine

## 2022-03-06 ENCOUNTER — Encounter: Payer: Medicaid Other | Admitting: Family Medicine

## 2022-03-08 DIAGNOSIS — E119 Type 2 diabetes mellitus without complications: Secondary | ICD-10-CM | POA: Diagnosis not present

## 2022-03-08 DIAGNOSIS — N3942 Incontinence without sensory awareness: Secondary | ICD-10-CM | POA: Diagnosis not present

## 2022-03-15 ENCOUNTER — Encounter: Payer: Medicaid Other | Admitting: Family Medicine

## 2022-03-17 ENCOUNTER — Other Ambulatory Visit: Payer: Self-pay | Admitting: Family Medicine

## 2022-03-27 ENCOUNTER — Other Ambulatory Visit: Payer: Self-pay

## 2022-03-27 DIAGNOSIS — C3492 Malignant neoplasm of unspecified part of left bronchus or lung: Secondary | ICD-10-CM

## 2022-03-28 ENCOUNTER — Ambulatory Visit
Admission: RE | Admit: 2022-03-28 | Discharge: 2022-03-28 | Disposition: A | Discharge status: Home / Self Care | Payer: Medicaid Other | Source: Ambulatory Visit | Attending: Oncology | Admitting: Oncology

## 2022-03-28 ENCOUNTER — Inpatient Hospital Stay: Payer: Medicaid Other | Attending: Oncology

## 2022-03-28 ENCOUNTER — Inpatient Hospital Stay: Payer: Medicaid Other

## 2022-03-28 DIAGNOSIS — D735 Infarction of spleen: Secondary | ICD-10-CM | POA: Insufficient documentation

## 2022-03-28 DIAGNOSIS — D631 Anemia in chronic kidney disease: Secondary | ICD-10-CM | POA: Insufficient documentation

## 2022-03-28 DIAGNOSIS — N1832 Chronic kidney disease, stage 3b: Secondary | ICD-10-CM | POA: Diagnosis not present

## 2022-03-28 DIAGNOSIS — I252 Old myocardial infarction: Secondary | ICD-10-CM | POA: Insufficient documentation

## 2022-03-28 DIAGNOSIS — E785 Hyperlipidemia, unspecified: Secondary | ICD-10-CM | POA: Diagnosis not present

## 2022-03-28 DIAGNOSIS — R0602 Shortness of breath: Secondary | ICD-10-CM | POA: Insufficient documentation

## 2022-03-28 DIAGNOSIS — F32A Depression, unspecified: Secondary | ICD-10-CM | POA: Diagnosis not present

## 2022-03-28 DIAGNOSIS — Z86711 Personal history of pulmonary embolism: Secondary | ICD-10-CM | POA: Insufficient documentation

## 2022-03-28 DIAGNOSIS — C3432 Malignant neoplasm of lower lobe, left bronchus or lung: Secondary | ICD-10-CM | POA: Insufficient documentation

## 2022-03-28 DIAGNOSIS — I129 Hypertensive chronic kidney disease with stage 1 through stage 4 chronic kidney disease, or unspecified chronic kidney disease: Secondary | ICD-10-CM | POA: Diagnosis not present

## 2022-03-28 DIAGNOSIS — I700 Atherosclerosis of aorta: Secondary | ICD-10-CM | POA: Insufficient documentation

## 2022-03-28 DIAGNOSIS — Z888 Allergy status to other drugs, medicaments and biological substances status: Secondary | ICD-10-CM | POA: Insufficient documentation

## 2022-03-28 DIAGNOSIS — K5730 Diverticulosis of large intestine without perforation or abscess without bleeding: Secondary | ICD-10-CM | POA: Diagnosis not present

## 2022-03-28 DIAGNOSIS — J984 Other disorders of lung: Secondary | ICD-10-CM | POA: Diagnosis not present

## 2022-03-28 DIAGNOSIS — R590 Localized enlarged lymph nodes: Secondary | ICD-10-CM | POA: Diagnosis not present

## 2022-03-28 DIAGNOSIS — I2510 Atherosclerotic heart disease of native coronary artery without angina pectoris: Secondary | ICD-10-CM | POA: Diagnosis not present

## 2022-03-28 DIAGNOSIS — E876 Hypokalemia: Secondary | ICD-10-CM | POA: Diagnosis not present

## 2022-03-28 DIAGNOSIS — F17210 Nicotine dependence, cigarettes, uncomplicated: Secondary | ICD-10-CM | POA: Diagnosis not present

## 2022-03-28 DIAGNOSIS — Z86718 Personal history of other venous thrombosis and embolism: Secondary | ICD-10-CM | POA: Diagnosis not present

## 2022-03-28 DIAGNOSIS — Z902 Acquired absence of lung [part of]: Secondary | ICD-10-CM | POA: Insufficient documentation

## 2022-03-28 DIAGNOSIS — E1122 Type 2 diabetes mellitus with diabetic chronic kidney disease: Secondary | ICD-10-CM | POA: Diagnosis not present

## 2022-03-28 DIAGNOSIS — E440 Moderate protein-calorie malnutrition: Secondary | ICD-10-CM | POA: Insufficient documentation

## 2022-03-28 DIAGNOSIS — Z89511 Acquired absence of right leg below knee: Secondary | ICD-10-CM | POA: Insufficient documentation

## 2022-03-28 DIAGNOSIS — C3492 Malignant neoplasm of unspecified part of left bronchus or lung: Secondary | ICD-10-CM | POA: Diagnosis not present

## 2022-03-28 DIAGNOSIS — Z8616 Personal history of COVID-19: Secondary | ICD-10-CM | POA: Insufficient documentation

## 2022-03-28 DIAGNOSIS — Z79899 Other long term (current) drug therapy: Secondary | ICD-10-CM | POA: Insufficient documentation

## 2022-03-28 DIAGNOSIS — F17290 Nicotine dependence, other tobacco product, uncomplicated: Secondary | ICD-10-CM | POA: Insufficient documentation

## 2022-03-28 DIAGNOSIS — Z95828 Presence of other vascular implants and grafts: Secondary | ICD-10-CM

## 2022-03-28 DIAGNOSIS — R5383 Other fatigue: Secondary | ICD-10-CM | POA: Diagnosis not present

## 2022-03-28 DIAGNOSIS — J90 Pleural effusion, not elsewhere classified: Secondary | ICD-10-CM | POA: Diagnosis not present

## 2022-03-28 DIAGNOSIS — C3490 Malignant neoplasm of unspecified part of unspecified bronchus or lung: Secondary | ICD-10-CM | POA: Diagnosis not present

## 2022-03-28 DIAGNOSIS — K661 Hemoperitoneum: Secondary | ICD-10-CM | POA: Diagnosis not present

## 2022-03-28 LAB — COMPREHENSIVE METABOLIC PANEL WITH GFR
ALT: 12 U/L (ref 0–44)
AST: 21 U/L (ref 15–41)
Albumin: 2.4 g/dL — ABNORMAL LOW (ref 3.5–5.0)
Alkaline Phosphatase: 108 U/L (ref 38–126)
Anion gap: 3 — ABNORMAL LOW (ref 5–15)
BUN: 22 mg/dL (ref 8–23)
CO2: 26 mmol/L (ref 22–32)
Calcium: 8.2 mg/dL — ABNORMAL LOW (ref 8.9–10.3)
Chloride: 111 mmol/L (ref 98–111)
Creatinine, Ser: 1.11 mg/dL — ABNORMAL HIGH (ref 0.44–1.00)
GFR, Estimated: 56 mL/min — ABNORMAL LOW (ref >60)
Glucose, Bld: 92 mg/dL (ref 70–99)
Potassium: 4.2 mmol/L (ref 3.5–5.1)
Sodium: 140 mmol/L (ref 135–145)
Total Bilirubin: 0.3 mg/dL (ref 0.3–1.2)
Total Protein: 5.7 g/dL — ABNORMAL LOW (ref 6.5–8.1)

## 2022-03-28 LAB — CBC WITH DIFFERENTIAL/PLATELET
Abs Immature Granulocytes: 0.02 K/uL (ref 0.00–0.07)
Basophils Absolute: 0 K/uL (ref 0.0–0.1)
Basophils Relative: 1 %
Eosinophils Absolute: 0.2 K/uL (ref 0.0–0.5)
Eosinophils Relative: 4 %
HCT: 27.8 % — ABNORMAL LOW (ref 36.0–46.0)
Hemoglobin: 8.7 g/dL — ABNORMAL LOW (ref 12.0–15.0)
Immature Granulocytes: 0 %
Lymphocytes Relative: 25 %
Lymphs Abs: 1.3 K/uL (ref 0.7–4.0)
MCH: 27.4 pg (ref 26.0–34.0)
MCHC: 31.3 g/dL (ref 30.0–36.0)
MCV: 87.7 fL (ref 80.0–100.0)
Monocytes Absolute: 0.5 K/uL (ref 0.1–1.0)
Monocytes Relative: 9 %
Neutro Abs: 3.2 K/uL (ref 1.7–7.7)
Neutrophils Relative %: 61 %
Platelets: 430 K/uL — ABNORMAL HIGH (ref 150–400)
RBC: 3.17 MIL/uL — ABNORMAL LOW (ref 3.87–5.11)
RDW: 15.2 % (ref 11.5–15.5)
WBC: 5.2 K/uL (ref 4.0–10.5)
nRBC: 0 % (ref 0.0–0.2)

## 2022-03-28 MED ORDER — IOHEXOL 300 MG/ML  SOLN
100.0000 mL | Freq: Once | INTRAMUSCULAR | Status: AC | PRN
  Administered 2022-03-28: 100 mL via INTRAVENOUS

## 2022-03-28 MED ORDER — SODIUM CHLORIDE 0.9% FLUSH
10.0000 mL | Freq: Once | INTRAVENOUS | Status: AC
  Administered 2022-03-28: 10 mL via INTRAVENOUS
  Filled 2022-03-28: qty 10

## 2022-03-28 MED ORDER — HEPARIN SOD (PORK) LOCK FLUSH 100 UNIT/ML IV SOLN
500.0000 [IU] | Freq: Once | INTRAVENOUS | Status: AC
  Administered 2022-03-28: 500 [IU] via INTRAVENOUS
  Filled 2022-03-28: qty 5

## 2022-03-31 ENCOUNTER — Inpatient Hospital Stay (HOSPITAL_BASED_OUTPATIENT_CLINIC_OR_DEPARTMENT_OTHER): Payer: Medicaid Other | Admitting: Oncology

## 2022-03-31 ENCOUNTER — Inpatient Hospital Stay: Payer: Medicaid Other

## 2022-03-31 ENCOUNTER — Encounter: Payer: Self-pay | Admitting: Oncology

## 2022-03-31 ENCOUNTER — Other Ambulatory Visit: Payer: Self-pay

## 2022-03-31 VITALS — BP 151/87 | HR 91 | Temp 97.6°F | Resp 18 | Wt 110.5 lb

## 2022-03-31 DIAGNOSIS — C3490 Malignant neoplasm of unspecified part of unspecified bronchus or lung: Secondary | ICD-10-CM

## 2022-03-31 DIAGNOSIS — C3492 Malignant neoplasm of unspecified part of left bronchus or lung: Secondary | ICD-10-CM

## 2022-03-31 DIAGNOSIS — D631 Anemia in chronic kidney disease: Secondary | ICD-10-CM | POA: Diagnosis not present

## 2022-03-31 DIAGNOSIS — C3432 Malignant neoplasm of lower lobe, left bronchus or lung: Secondary | ICD-10-CM | POA: Diagnosis not present

## 2022-03-31 DIAGNOSIS — N1832 Chronic kidney disease, stage 3b: Secondary | ICD-10-CM | POA: Diagnosis not present

## 2022-03-31 DIAGNOSIS — E876 Hypokalemia: Secondary | ICD-10-CM

## 2022-03-31 DIAGNOSIS — E440 Moderate protein-calorie malnutrition: Secondary | ICD-10-CM

## 2022-03-31 DIAGNOSIS — N1831 Chronic kidney disease, stage 3a: Secondary | ICD-10-CM

## 2022-03-31 LAB — IRON AND TIBC
Iron: 50 ug/dL (ref 28–170)
Saturation Ratios: 14 % (ref 10.4–31.8)
TIBC: 368 ug/dL (ref 250–450)
UIBC: 318 ug/dL

## 2022-03-31 LAB — RETIC PANEL
Immature Retic Fract: 12.9 % (ref 2.3–15.9)
RBC.: 3.5 MIL/uL — ABNORMAL LOW (ref 3.87–5.11)
Retic Count, Absolute: 43.1 10*3/uL (ref 19.0–186.0)
Retic Ct Pct: 1.2 % (ref 0.4–3.1)
Reticulocyte Hemoglobin: 30.6 pg (ref >27.9)

## 2022-03-31 LAB — FERRITIN: Ferritin: 12 ng/mL (ref 11–307)

## 2022-03-31 NOTE — Assessment & Plan Note (Signed)
Encourage oral hydration. Avoid nephrotoxins.

## 2022-03-31 NOTE — Assessment & Plan Note (Signed)
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  Status post left lower lobectomy and lymph node dissection pT1c pN2 [pathology addendum changed to N2]  She has negative surgical margin, no clear benefit of postoperative RT.  S/p 4 cycles of adjuvant chemotherapy Carboplatin and Taxol.  S/p 1 cycle of Tecentriq, Tecentriq was discontinued due to severe immunotherapy induced liver toxicity Labs reviewed and discussed with patient. CT scan shows no recurrence.  Continue surveillance.

## 2022-03-31 NOTE — Assessment & Plan Note (Signed)
Patient has gained weight.  Continue follow-up with nutritionist  Continue nutrition supplements.

## 2022-03-31 NOTE — Progress Notes (Signed)
Pt here for follow up. No new concerns voiced.   

## 2022-03-31 NOTE — Assessment & Plan Note (Signed)
Chronic anemia, hemoglobin slightly decreased.  Possible anemia due to CKD.  Recommend IV Venofer to increase her iron store.  I discussed about the potential risks including but not limited to allergic reactions/infusion reactions including anaphylactic reactions, phlebitis, high blood pressure, wheezing, SOB, skin rash, weight gain, leg swelling, headache, nausea and fatigue, etc.Plan IV venofer weekly x 4

## 2022-03-31 NOTE — Progress Notes (Signed)
Hematology/Oncology Progress note [phone] [phone]      Patient Care Team: [name], MD as PCP - General (Family Medicine) Earlie Server, MD as Consulting Physician (Hematology and Oncology)  ASSESSMENT & PLAN:   Cancer Staging  Non-small cell lung cancer [facility]) Staging form: Lung, AJCC 8th Edition - Pathologic stage from 01/28/2021: Stage IIIA (pT1c, pN2, cM0) - Signed by Earlie Server, MD on 02/17/2021   Non-small cell lung cancer ([place]) #Stage IIIA left lung non-small cell lung cancer-.  Poorly differentiated adenocarcinoma.  Status post left lower lobectomy and lymph node dissection pT1c pN2 [pathology addendum changed to N2]  She has negative surgical margin, no clear benefit of postoperative RT.  S/p 4 cycles of adjuvant chemotherapy Carboplatin and Taxol.  S/p 1 cycle of Tecentriq, Tecentriq was discontinued due to severe immunotherapy induced liver toxicity Labs reviewed and discussed with patient. CT scan shows no recurrence.  Continue surveillance.   Moderate protein-calorie malnutrition ([place]) Patient has gained weight.  Continue follow-up with nutritionist  Continue nutrition supplements.  Stage 3b chronic kidney disease ([place]) Encourage oral hydration. Avoid nephrotoxins.  Anemia in chronic kidney disease (CKD) Chronic anemia, hemoglobin slightly decreased.  Possible anemia due to CKD.  Recommend IV Venofer to increase her iron store.  I discussed about the potential risks including but not limited to allergic reactions/infusion reactions including anaphylactic reactions, phlebitis, high blood pressure, wheezing, SOB, skin rash, weight gain, leg swelling, headache, nausea and fatigue, etc.Plan IV venofer weekly x 4    Hypokalemia Potasium has normalized.   Orders Placed This Encounter  Procedures   CT Chest Wo Contrast    Standing Status:   Future    Standing Expiration Date:   03/31/2023    Order Specific Question:   Preferred imaging  location?    Answer:   [facility]   Retic Panel    Standing Status:   Future    Number of Occurrences:   1    Standing Expiration Date:   04/01/2023   Iron and TIBC([facility])   Folate    Standing Status:   Future    Standing Expiration Date:   04/01/2023   Vitamin B12    Standing Status:   Future    Standing Expiration Date:   04/01/2023   Retic Panel    Standing Status:   Future    Standing Expiration Date:   04/01/2023   Iron, TIBC and Ferritin Panel    Standing Status:   Future    Standing Expiration Date:   04/01/2023   Lactate dehydrogenase    Standing Status:   Future    Standing Expiration Date:   04/01/2023   CBC    Standing Status:   Future    Standing Expiration Date:   03/31/2023   Comprehensive metabolic panel    Standing Status:   Future    Standing Expiration Date:   03/31/2023   Ferritin      Follow-up 3 months CT chest wo contrast  Prior to MD  All questions were answered. The patient knows to call the clinic with any problems, questions or concerns.  Earlie Server, MD, PhD [facility] Hematology Oncology 03/31/2022     CHIEF COMPLAINTS/REASON FOR VISIT:  Follow up for stage IIIA lung cancer  HISTORY OF PRESENTING ILLNESS:  Patient presents for follow-up of stage III lung adenocarcinoma. Oncology history listed as below. Oncology History  Non-small cell lung cancer ([place])  12/30/2020 Initial Diagnosis   Non-small cell lung  cancer   -11/17/2020, CT hematuria work-up was obtained which showed no evidence of urinary tract calculus or hydronephrosis.  Small left renal cyst. Incidental findings of 1.9 x 1.2 cm left lower lobe pulmonary nodule worrisome for malignancy. -12/07/2020, PET scan showed left lower lobe 1.3 x 1.8 cm lung nodule with SUV of 6.5.  Suspect early stage primary lung neoplasm.  No hilar or mediastinal lymphadenopathy activity. - 12/23/2020 S/p CT guided biopsy of left lower lobe mass.  Pathology showed non-small cell  carcinoma, favor adenocarcinoma.  Positive for TTF-1, negative for p40.     01/28/2021 Surgery   - left lower lobectomy with lymph node dissection. Pathology showed invasive poorly differentiated adenocarcinoma, 2.2 cm.  Tumor abuts pleura but visceral pleural surface is not involved by carcinoma.  LVI invasion is present, resection margins are negative for carcinoma.  10 lymph nodes were harvested and 3 lymph nodes were involved with carcinoma. pT1c pN2 Patient's case was discussed at [place] oncology tumor board.  And consensus recommendation was adjuvant chemotherapy  Foundation one testing showed PD-L1 1, No reportable alterations   01/28/2021 Cancer Staging   Staging form: Lung, AJCC 8th Edition - Pathologic stage from 01/28/2021: Stage IIIA (pT1c, pN2, cM0) - Signed by Yu, Zhou, MD on 02/17/2021 Stage prefix: Initial diagnosis Residual tumor (R): R0 - None   02/28/2021 - 05/19/2021 Chemotherapy   Patient is on Treatment Plan : LUNG Carboplatin + Paclitaxel q21d     07/07/2021 - 07/07/2021 Chemotherapy   Atezolizumab q21d x 12 dose. She developed gade 4 liver toxicity. Treatment was discontinued.    12/22/2021 Imaging   CT chest abdomen pelvis wo contrast 1. Similar surgical changes of left lower lobectomy without evidence of recurrent disease. 2. No evidence of metastatic disease in the chest, abdomen or pelvis on this noncontrast enhanced CT. 3. Colonic diverticulosis without findings of acute diverticulitis.4.  Aortic Atherosclerosis (ICD10-I70.0).   12/31/2021 Imaging   MRI brain w wo contrast showed No acute intracranial process. No evidence of metastatic disease in the brain.   03/29/2022 Imaging   CT chest abdomen pelvis w contrast 1. No evidence of recurrent or metastatic disease. 2. New patchy and wispy areas of ground-glass in the right upper and right lower lobes, likely infectious/inflammatory etiology.Correlate with any history of recent COVID infection. 3. Trace left  pleural fluid. 4. Splenic infarct. 5. Aortic atherosclerosis (ICD10-I70.0). Coronary artery calcification. 6. Pulmonic trunk is enlarged, indicative of pulmonary arterial hypertension.       INTERVAL HISTORY [name] [name] is a 66 y.o. female who has above history reviewed by me today presents for follow up visit for Stage IIIA lung cancer.  Patient reports feeling well.  She takes nutrition supplementation.  She gained 9 pounds.  No new complaints. She denies any recent upper respiratory symptoms, fever chills, cough, chest pain.  Mild SOB with exertion.   Review of Systems  Constitutional:  Positive for fatigue. Negative for appetite change, chills, fever and unexpected weight change.  HENT:   Negative for hearing loss and voice change.   Eyes:  Negative for eye problems.  Respiratory:  Negative for chest tightness and cough.   Cardiovascular:  Negative for chest pain.  Gastrointestinal:  Negative for abdominal distention, abdominal pain, blood in stool, diarrhea and nausea.  Endocrine: Negative for hot flashes.  Genitourinary:  Negative for difficulty urinating, dysuria and frequency.   Musculoskeletal:  Negative for arthralgias.  Skin:  Negative for itching and rash.  Neurological:  Negative for   extremity weakness and light-headedness.  Hematological:  Negative for adenopathy.  Psychiatric/Behavioral:  Negative for confusion.     MEDICAL HISTORY:  Past Medical History:  Diagnosis Date   Anemia    Cancer ([place])    CHF (congestive heart failure) ([place])    02/16/20 HFrEF 20-25% in setting of acute DVT/PE, underlying CAD   CKD (chronic kidney disease)    Coronary artery disease    02/20/20: CTO pRCA with left-to-right collaterals, 50% mLAD, 80% OM1, medical therapy   Depression    Diabetes ([place])    Diabetes mellitus without complication ([place])    DVT (deep venous thrombosis) ([place]) 02/16/2020   RLE DVT proximal veins 02/16/20 Duplex   Hyperlipidemia    Hypertension     Myocardial infarction [facility]) 2009   Stress related per patient; NSTEMI 2012 100% RCA with left-to-right collaterals   PE (pulmonary thromboembolism) ([place]) 02/15/2020   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, RLE BKA   Peripheral artery disease ([place])     SURGICAL HISTORY: Past Surgical History:  Procedure Laterality Date   IR IMAGING GUIDED PORT INSERTION  07/27/2021   Right lower extremity BKA Right     SOCIAL HISTORY: Social History   Socioeconomic History   Marital status: Married    Spouse name: Not on file   Number of children: Not on file   Years of education: Not on file   Highest education level: Not on file  Occupational History   Not on file  Tobacco Use   Smoking status: Former    Packs/day: 0.25    Types: Cigars, Cigarettes    Quit date: 01/17/2021    Years since quitting: 1.2   Smokeless tobacco: Never  Vaping Use   Vaping Use: Never used  Substance and Sexual Activity   Alcohol use: Never   Drug use: Not Currently   Sexual activity: Not Currently  Other Topics Concern   Not on file  Social History Narrative   Not on file   Social Determinants of Health   Financial Resource Strain: Low Risk  (12/15/2021)   Overall Financial Resource Strain (CARDIA)    Difficulty of Paying Living Expenses: Not very hard  Food Insecurity: No Food Insecurity (12/15/2021)   Hunger Vital Sign    Worried About Running Out of Food in the Last Year: Never true    Ran Out of Food in the Last Year: Never true  Transportation Needs: Unmet Transportation Needs (03/31/2022)   PRAPARE - Transportation    Lack of Transportation (Medical): Yes    Lack of Transportation (Non-Medical): Yes  Physical Activity: Not on file  Stress: Stress Concern Present (12/15/2021)   [employer] [place]    Feeling of Stress : To some extent  Social Connections: Moderately Isolated (12/15/2021)   Social Connection and Isolation Panel  [NHANES]    Frequency of Communication with Friends and Family: Twice a week    Frequency of Social Gatherings with Friends and Family: Twice a week    Attends Religious Services: Never    Marine scientist or Organizations: No    Attends Music therapist: Never    Marital Status: Married  Human resources officer Violence: Not on file    FAMILY HISTORY: Family History  Problem Relation Age of Onset   Heart disease Mother    Diabetes Mother    Heart disease Father    Diabetes Father     ALLERGIES:  is  allergic to lisinopril.  MEDICATIONS:  Current Outpatient Medications  Medication Sig Dispense Refill   ACCU-CHEK GUIDE test strip USE UP TO 4 TIMES A DAY AS DIRECTED 100 strip 1   Accu-Chek Softclix Lancets lancets SMARTSIG:Topical 1-4 Times Daily     aspirin EC 81 MG tablet Take 81 mg by mouth in the morning. Swallow whole.     blood glucose meter kit and supplies KIT Dispense based on patient and insurance preference. Use up to four times daily as directed. 1 each 0   carboxymethylcellulose (REFRESH PLUS) 0.5 % SOLN Place 1 drop into both eyes in the morning and at bedtime.     diphenhydrAMINE (BENADRYL) 25 MG tablet Take 25 mg by mouth at bedtime.     docusate sodium (COLACE) 100 MG capsule Take 1 capsule (100 mg total) by mouth 2 (two) times daily. 60 capsule 1   DULoxetine (CYMBALTA) 20 MG capsule SMARTSIG:2 Capsule(s) By Mouth Morning-Night     gabapentin (NEURONTIN) 100 MG capsule Take 1 capsule (100 mg total) by mouth 2 (two) times daily. 60 capsule 5   HUMALOG KWIKPEN 100 UNIT/ML KwikPen Inject 8 Units into the skin 3 (three) times daily after meals. 15 mL 11   isosorbide dinitrate (ISORDIL) 30 MG tablet Take 1 tablet (30 mg total) by mouth 3 (three) times daily. 270 tablet 1   ketoconazole (NIZORAL) 2 % cream APPLY 1 APPLICATION TOPICALLY IN THE MORNING AND AT BEDTIME. APPLIED TO FEET 60 g 1   LANTUS SOLOSTAR 100 UNIT/ML Solostar Pen INJECT 20 UNITS INTO THE SKIN  AT BEDTIME. 3 mL 0   lidocaine-prilocaine (EMLA) cream Apply 1 application topically as needed. Apply to port and cover with saran wrap 1-2 hours prior to port access 30 g 1   metFORMIN (GLUCOPHAGE-XR) 500 MG 24 hr tablet TAKE 1 TABLET BY MOUTH TWICE A DAY 180 tablet 2   metoprolol succinate (TOPROL-XL) 50 MG 24 hr tablet Take 1 tablet (50 mg total) by mouth daily. Take with or immediately following a meal. 90 tablet 1   Multiple Vitamin (MULTIVITAMIN WITH MINERALS) TABS tablet Take 1 tablet by mouth in the morning. Adults 50+     pantoprazole (PROTONIX) 40 MG tablet Take 1 tablet (40 mg total) by mouth daily. 30 tablet 2   potassium chloride SA (KLOR-CON M) 20 MEQ tablet Take 1 tablet (20 mEq total) by mouth daily. 14 tablet 0   spironolactone (ALDACTONE) 25 MG tablet Take 1 tablet (25 mg total) by mouth daily. 90 tablet 1   ondansetron (ZOFRAN) 8 MG tablet Take 1 tablet (8 mg total) by mouth 2 (two) times daily as needed (Nausea or vomiting). Start if needed on the third day after cisplatin. (Patient not taking: Reported on 03/31/2022) 30 tablet 1   prochlorperazine (COMPAZINE) 10 MG tablet TAKE 1 TABLET (10 MG TOTAL) BY MOUTH EVERY 6 (SIX) HOURS AS NEEDED (NAUSEA OR VOMITING). (Patient not taking: Reported on 03/31/2022) 30 tablet 1   No current facility-administered medications for this visit.   Facility-Administered Medications Ordered in Other Visits  Medication Dose Route Frequency Provider Last Rate Last Admin   heparin lock flush 100 UNIT/ML injection            heparin lock flush 100 unit/mL  500 Units Intravenous Once Earlie Server, MD         PHYSICAL EXAMINATION: ECOG PERFORMANCE STATUS: 1 - Symptomatic but completely ambulatory Vitals:   03/31/22 1159  BP: (!) 151/87  Pulse: 91  Resp:  18  Temp: 97.6 F (36.4 C)   Filed Weights   03/31/22 1159  Weight: 110 lb 8 oz (50.1 kg)    Physical Exam Constitutional:      General: She is not in acute distress.    Appearance: She is  not ill-appearing.  HENT:     Head: Normocephalic and atraumatic.  Eyes:     General: No scleral icterus. Cardiovascular:     Rate and Rhythm: Normal rate and regular rhythm.     Heart sounds: Normal heart sounds.  Pulmonary:     Effort: Pulmonary effort is normal. No respiratory distress.     Breath sounds: No wheezing.     Comments: Absent breath sounds left basilar Abdominal:     General: Bowel sounds are normal. There is no distension.     Palpations: Abdomen is soft.  Musculoskeletal:        General: No deformity. Normal range of motion.     Cervical back: Normal range of motion and neck supple.     Comments: Right lower extremity BKA  Skin:    General: Skin is warm and dry.     Findings: No erythema or rash.  Neurological:     Mental Status: She is alert and oriented to person, place, and time. Mental status is at baseline.     Cranial Nerves: No cranial nerve deficit.     Coordination: Coordination normal.  Psychiatric:        Mood and Affect: Mood normal.     LABORATORY DATA:  I have reviewed the data as listed     Latest Ref Rng & Units 03/28/2022   10:09 AM 01/23/2022   10:08 AM 12/26/2021    9:27 AM  CBC  WBC 4.0 - 10.5 K/uL 5.2  4.6  3.7   Hemoglobin 12.0 - 15.0 g/dL 8.7  9.2  9.5   Hematocrit 36.0 - 46.0 % 27.8  29.7  30.1   Platelets 150 - 400 K/uL 430  394  389       Latest Ref Rng & Units 03/28/2022   10:09 AM 01/23/2022   10:08 AM 12/26/2021    9:27 AM  CMP  Glucose 70 - 99 mg/dL 92  158  81   BUN 8 - 23 mg/dL 22  30  18   Creatinine 0.44 - 1.00 mg/dL 1.11  1.36  1.17   Sodium 135 - 145 mmol/L 140  139  137   Potassium 3.5 - 5.1 mmol/L 4.2  4.0  2.6   Chloride 98 - 111 mmol/L 111  107  102   CO2 22 - 32 mmol/L 26  24  29   Calcium 8.9 - 10.3 mg/dL 8.2  8.6  8.4   Total Protein 6.5 - 8.1 g/dL 5.7  6.0  5.9   Total Bilirubin 0.3 - 1.2 mg/dL 0.3  0.3  0.5   Alkaline Phos 38 - 126 U/L 108  116  112   AST 15 - 41 U/L 21  32  27   ALT 0 - 44 U/L 12   17  12        RADIOGRAPHIC STUDIES: I have personally reviewed the radiological images as listed and agreed with the findings in the report. CT CHEST ABDOMEN PELVIS W CONTRAST  Result Date: 03/29/2022 CLINICAL DATA:  Lung cancer, status post left lower lobectomy and lymph node dissection. * Tracking Code: BO * EXAM: CT CHEST, ABDOMEN, AND PELVIS WITH CONTRAST TECHNIQUE: Multidetector CT   imaging of the chest, abdomen and pelvis was performed following the standard protocol during bolus administration of intravenous contrast. RADIATION DOSE REDUCTION: This exam was performed according to the departmental dose-optimization program which includes automated exposure control, adjustment of the mA and/or kV according to patient size and/or use of iterative reconstruction technique. CONTRAST:  100mL OMNIPAQUE IOHEXOL 300 MG/ML  SOLN COMPARISON:  12/22/2021. FINDINGS: CT CHEST FINDINGS Cardiovascular: Right IJ Port-A-Cath terminates in the right atrium. Coronary artery calcification. Pulmonic trunk and heart are enlarged. No pericardial effusion. Mediastinum/Nodes: No pathologically enlarged mediastinal, hilar or axillary lymph nodes. Esophagus is grossly unremarkable. Lungs/Pleura: Postoperative volume loss and scarring in the left hemithorax. New patchy and wispy areas of ground-glass in the posterior segment right upper lobe and right lower lobe. Trace left pleural fluid. Musculoskeletal: Mild degenerative changes in the spine. No worrisome lytic or sclerotic lesions. CT ABDOMEN PELVIS FINDINGS Hepatobiliary: Liver is unremarkable.  Cholecystectomy. Pancreas: Negative. Spleen: Geographic low attenuation within the central spleen, likely infarct. Adrenals/Urinary Tract: Adrenal glands and right kidney are unremarkable. Low-attenuation lesions in the left kidney measure up to 2.1 cm and are likely cysts. No specific follow-up necessary. Ureters are decompressed. Bladder may be slightly thick walled. Stomach/Bowel:  BP stomach, small bowel, appendix and colon are unremarkable. Vascular/Lymphatic: Atherosclerotic calcification of the aorta. No pathologically enlarged lymph nodes. Reproductive: Uterus is visualized.  No adnexal mass. Other: Trace pelvic free fluid. Mesenteries and peritoneum are otherwise unremarkable. Left hemidiaphragm is elevated. Musculoskeletal: Degenerative changes in the spine. No worrisome lytic or sclerotic lesions. IMPRESSION: 1. No evidence of recurrent or metastatic disease. 2. New patchy and wispy areas of ground-glass in the right upper and right lower lobes, likely infectious/inflammatory etiology. Correlate with any history of recent COVID infection. 3. Trace left pleural fluid. 4. Splenic infarct. 5. Aortic atherosclerosis (ICD10-I70.0). Coronary artery calcification. 6. Pulmonic trunk is enlarged, indicative of pulmonary arterial hypertension. Electronically Signed   By: Melinda  Blietz M.D.   On: 03/29/2022 13:37    

## 2022-03-31 NOTE — Assessment & Plan Note (Signed)
Potasium has normalized.

## 2022-04-03 ENCOUNTER — Telehealth: Payer: Self-pay

## 2022-04-03 MED FILL — Iron Sucrose Inj 20 MG/ML (Fe Equiv): INTRAVENOUS | Qty: 10 | Status: AC

## 2022-04-03 NOTE — Telephone Encounter (Signed)
Please schedule and inform pt of appts:   Venofer weekly x4 Keep follow up appts as scheduled.

## 2022-04-03 NOTE — Telephone Encounter (Signed)
-----   Message from Earlie Server, MD sent at 03/31/2022 11:45 PM EDT ----- Please schedule patient to get IV venofer weekly x 4. We discussed about this plan during her visit.  Keep same follow up thanks.

## 2022-04-04 ENCOUNTER — Inpatient Hospital Stay: Payer: Medicaid Other

## 2022-04-04 VITALS — BP 162/81 | HR 82 | Temp 96.6°F | Resp 18

## 2022-04-04 DIAGNOSIS — C3432 Malignant neoplasm of lower lobe, left bronchus or lung: Secondary | ICD-10-CM | POA: Diagnosis not present

## 2022-04-04 DIAGNOSIS — C3492 Malignant neoplasm of unspecified part of left bronchus or lung: Secondary | ICD-10-CM

## 2022-04-04 MED ORDER — HEPARIN SOD (PORK) LOCK FLUSH 100 UNIT/ML IV SOLN
500.0000 [IU] | Freq: Once | INTRAVENOUS | Status: AC | PRN
  Administered 2022-04-04: 500 [IU]
  Filled 2022-04-04: qty 5

## 2022-04-04 MED ORDER — SODIUM CHLORIDE 0.9 % IV SOLN
Freq: Once | INTRAVENOUS | Status: AC
  Filled 2022-04-04: qty 250

## 2022-04-04 MED ORDER — SODIUM CHLORIDE 0.9 % IV SOLN
200.0000 mg | Freq: Once | INTRAVENOUS | Status: AC
  Administered 2022-04-04: 200 mg via INTRAVENOUS
  Filled 2022-04-04: qty 200

## 2022-04-05 ENCOUNTER — Inpatient Hospital Stay: Payer: Medicaid Other

## 2022-04-05 NOTE — Progress Notes (Signed)
Nutrition  Called patient for scheduled nutrition phone follow-up visit.  No answer.  Left message with call back number.   Called patient back at 2:27 pm and no answer.    Chart reviewed Patient currently on surveillance for lung cancer.    Noted weight improved to 110 lb on 10/13 97 lb on 6/12 104 lb on 5/15 105 lb on 4/17 110 lb on 3/27  RD available as needed and contact phone number left for patient  Cabell Lazenby B. Zenia Resides, [place], [place] Registered Dietitian [phone]

## 2022-04-06 ENCOUNTER — Inpatient Hospital Stay: Payer: Medicaid Other

## 2022-04-06 NOTE — Progress Notes (Signed)
Nutrition Follow-up:  Patient with lung cancer and currently under surveillance.  Receiving IV venofer for anemia, followed by Dr [name].  Patient called RD back yesterday afternoon and left message.  RD able to call this am but unable to leave voicemail due to full mailbox.  RD called back this afternoon and able to reach patient.   Patient reports that appetite is much better.  "I am eating everything I can get my hands on."  Eating shrimp, potatoes, boiled eggs, sausage, cake, broccoli.  Drinking shakes.  Denies any nutrition impact symptoms at this time.     Labs: reviewed from 10/10  Anthropometrics:   Weight 110 lb on 10/13 increased  97 lb on 6/12 104 lb on 5/15 105 lb on 4/17 110 lb on 3/27   NUTRITION DIAGNOSIS: Inadequate oral intake improved   INTERVENTION:  Encouraged patient to continue eating well balanced diet, including good sources of protein Encouraged patient to pay close attention to blood glucose now that appetite and weight is increasing.  If blood glucose starting to increase would need to touch base with PCP Patient has contact information and knows to contact RD if needed in the future     NEXT VISIT: no follow-up RD available if needed  [name] [name] B. Zenia Resides, [place], [place] Registered Dietitian [phone]

## 2022-04-07 ENCOUNTER — Encounter: Payer: Medicaid Other | Admitting: Family Medicine

## 2022-04-07 ENCOUNTER — Telehealth: Payer: Self-pay | Admitting: Family Medicine

## 2022-04-07 ENCOUNTER — Other Ambulatory Visit: Payer: Self-pay

## 2022-04-07 ENCOUNTER — Telehealth: Payer: Self-pay

## 2022-04-07 DIAGNOSIS — N870 Mild cervical dysplasia: Secondary | ICD-10-CM

## 2022-04-07 DIAGNOSIS — E119 Type 2 diabetes mellitus without complications: Secondary | ICD-10-CM | POA: Diagnosis not present

## 2022-04-07 DIAGNOSIS — N3942 Incontinence without sensory awareness: Secondary | ICD-10-CM | POA: Diagnosis not present

## 2022-04-07 NOTE — Telephone Encounter (Signed)
Copied from [place] [id number]. Topic: Referral - Question >> Apr 07, 2022  2:40 PM Chapman Fitch wrote: Reason for CRM: OBGYN called and received referral / the had questions and wanted to know did the pt have a history of abnormal PAPS and when was the date of her last abnormal PAP/they would like a PAP report with the records if its a current abnormal PAP/ please fax to fax# [phone]/ please advise

## 2022-04-07 NOTE — Progress Notes (Signed)
Ref to GYN placed

## 2022-04-07 NOTE — Telephone Encounter (Signed)
Called Amy at [facility] and left message that in 02/25/2019 pt had colposcopy and was Dx with cerv dysplasia. [phone]. Pt is supposed to go back to them for follow up but did not go back. Referral has been placed

## 2022-04-10 ENCOUNTER — Inpatient Hospital Stay: Payer: Medicaid Other

## 2022-04-10 VITALS — BP 161/95 | HR 85 | Temp 98.3°F | Resp 20

## 2022-04-10 DIAGNOSIS — C3492 Malignant neoplasm of unspecified part of left bronchus or lung: Secondary | ICD-10-CM

## 2022-04-10 DIAGNOSIS — C3432 Malignant neoplasm of lower lobe, left bronchus or lung: Secondary | ICD-10-CM | POA: Diagnosis not present

## 2022-04-10 MED ORDER — HEPARIN SOD (PORK) LOCK FLUSH 100 UNIT/ML IV SOLN
500.0000 [IU] | Freq: Once | INTRAVENOUS | Status: AC | PRN
  Administered 2022-04-10: 500 [IU]
  Filled 2022-04-10: qty 5

## 2022-04-10 MED ORDER — SODIUM CHLORIDE 0.9 % IV SOLN
Freq: Once | INTRAVENOUS | Status: AC
  Filled 2022-04-10: qty 250

## 2022-04-10 MED ORDER — SODIUM CHLORIDE 0.9% FLUSH
10.0000 mL | Freq: Once | INTRAVENOUS | Status: AC | PRN
  Administered 2022-04-10: 10 mL
  Filled 2022-04-10: qty 10

## 2022-04-10 MED ORDER — SODIUM CHLORIDE 0.9 % IV SOLN
200.0000 mg | Freq: Once | INTRAVENOUS | Status: AC
  Administered 2022-04-10: 200 mg via INTRAVENOUS
  Filled 2022-04-10: qty 200

## 2022-04-11 ENCOUNTER — Telehealth: Payer: Self-pay | Admitting: Family Medicine

## 2022-04-11 NOTE — Telephone Encounter (Signed)
Copied from [place] [phone]. Topic: Referral - Question >> Apr 11, 2022 10:59 AM Everette C wrote: Reason for CRM: Amy with [facility] has called to request additional information related to the history of the patient's pap smears and their possible abnormality   Amy shares that they have previously requested additional information and re-received the same documents the were initially submitted in the patient's referral   Amy would like to speak directly with a member of practice clinical staff when possible

## 2022-04-17 ENCOUNTER — Inpatient Hospital Stay: Payer: Medicaid Other

## 2022-04-17 VITALS — BP 122/73 | HR 90 | Temp 96.0°F

## 2022-04-17 DIAGNOSIS — C3492 Malignant neoplasm of unspecified part of left bronchus or lung: Secondary | ICD-10-CM

## 2022-04-17 DIAGNOSIS — C3432 Malignant neoplasm of lower lobe, left bronchus or lung: Secondary | ICD-10-CM | POA: Diagnosis not present

## 2022-04-17 MED ORDER — SODIUM CHLORIDE 0.9% FLUSH
10.0000 mL | Freq: Once | INTRAVENOUS | Status: AC | PRN
  Administered 2022-04-17: 10 mL
  Filled 2022-04-17: qty 10

## 2022-04-17 MED ORDER — SODIUM CHLORIDE 0.9 % IV SOLN
Freq: Once | INTRAVENOUS | Status: AC
  Filled 2022-04-17: qty 250

## 2022-04-17 MED ORDER — SODIUM CHLORIDE 0.9 % IV SOLN
200.0000 mg | Freq: Once | INTRAVENOUS | Status: AC
  Administered 2022-04-17: 200 mg via INTRAVENOUS
  Filled 2022-04-17: qty 200

## 2022-04-17 MED ORDER — HEPARIN SOD (PORK) LOCK FLUSH 100 UNIT/ML IV SOLN
500.0000 [IU] | Freq: Once | INTRAVENOUS | Status: AC | PRN
  Administered 2022-04-17: 500 [IU]
  Filled 2022-04-17: qty 5

## 2022-04-17 NOTE — Progress Notes (Signed)
Dr [name] informed that port catheter tip placement in right atrium per CT scan from 03/29/22. Imaging was reviewed by Dr. Kathlene Cote (IR), and he feels that previous reading showed artifact and that tip is in SVC.

## 2022-04-17 NOTE — Patient Instructions (Signed)

## 2022-04-18 ENCOUNTER — Other Ambulatory Visit: Payer: Self-pay | Admitting: Family Medicine

## 2022-04-19 ENCOUNTER — Encounter: Payer: Self-pay | Admitting: Oncology

## 2022-04-24 ENCOUNTER — Inpatient Hospital Stay: Payer: Self-pay | Attending: Oncology

## 2022-04-24 ENCOUNTER — Inpatient Hospital Stay: Payer: Medicaid Other

## 2022-04-24 VITALS — BP 163/91 | HR 84 | Temp 97.1°F | Resp 18

## 2022-04-24 DIAGNOSIS — C3492 Malignant neoplasm of unspecified part of left bronchus or lung: Secondary | ICD-10-CM

## 2022-04-24 DIAGNOSIS — E46 Unspecified protein-calorie malnutrition: Secondary | ICD-10-CM | POA: Diagnosis not present

## 2022-04-24 DIAGNOSIS — N1832 Chronic kidney disease, stage 3b: Secondary | ICD-10-CM | POA: Insufficient documentation

## 2022-04-24 DIAGNOSIS — C3432 Malignant neoplasm of lower lobe, left bronchus or lung: Secondary | ICD-10-CM | POA: Diagnosis present

## 2022-04-24 DIAGNOSIS — Z79899 Other long term (current) drug therapy: Secondary | ICD-10-CM | POA: Diagnosis not present

## 2022-04-24 DIAGNOSIS — D631 Anemia in chronic kidney disease: Secondary | ICD-10-CM | POA: Insufficient documentation

## 2022-04-24 MED ORDER — SODIUM CHLORIDE 0.9 % IV SOLN
Freq: Once | INTRAVENOUS | Status: AC
  Filled 2022-04-24: qty 250

## 2022-04-24 MED ORDER — HEPARIN SOD (PORK) LOCK FLUSH 100 UNIT/ML IV SOLN
500.0000 [IU] | Freq: Once | INTRAVENOUS | Status: AC | PRN
  Administered 2022-04-24: 500 [IU]
  Filled 2022-04-24: qty 5

## 2022-04-24 MED ORDER — SODIUM CHLORIDE 0.9 % IV SOLN
200.0000 mg | Freq: Once | INTRAVENOUS | Status: AC
  Administered 2022-04-24: 200 mg via INTRAVENOUS
  Filled 2022-04-24: qty 200

## 2022-04-24 NOTE — Progress Notes (Signed)
1346: Dr. [name] aware of the B/P increase to 188/99.  Pt reports " I can feel my heart beating" pt denies any other symptoms including headaches or blurred vision. Pt states she was under a lot of stress prior to coming to clinic and that pt normally takes blood pressure medications in the morning but has not taken it today.  Dr. [name] aware  1355: Per Dr. [name], monitor additional 10 minutes and recheck B/P.   1407: B/P 163/91 pt continues to deny symptoms.  Per Dr. [name] okay to discharge pt home.  Pt educated to monitor B/P at home and contact PCP if b/p remains elevated. Pt verbalizes understanding. 1415: Pt stable at discharge. No s/s of distress noted, pt denies any complaints or concerns.

## 2022-04-24 NOTE — Progress Notes (Signed)
Survivorship Care Plan visit completed.  Treatment summary reviewed and given to patient.  ASCO answers booklet reviewed and given to patient.  CARE program and Cancer Transitions discussed with patient along with other resources cancer center offers to patients and caregivers.  Patient verbalized understanding.    

## 2022-04-26 ENCOUNTER — Telehealth: Payer: Self-pay

## 2022-04-26 NOTE — Telephone Encounter (Signed)
Called pt and gave her the number to [facility] [phone]

## 2022-05-10 ENCOUNTER — Telehealth: Payer: Self-pay | Admitting: Family Medicine

## 2022-05-10 NOTE — Telephone Encounter (Signed)
Patient call in for help or samples with LANTUS SOLOSTAR 100 UNIT/ML Solostar Pen . Her insurance doesn't start until Dec 1 and the med is too expensive , at 400.00.

## 2022-05-15 NOTE — Telephone Encounter (Signed)
Patient called back n, stating needs assistace or samples forLANTUS SOLOSTAR 100 UNIT/ML Solostar Pen , because too expensive at 400.00. Insurance doesn't start until Dec 1

## 2022-05-25 ENCOUNTER — Other Ambulatory Visit: Payer: Self-pay | Admitting: Oncology

## 2022-05-25 ENCOUNTER — Other Ambulatory Visit: Payer: Self-pay | Admitting: Family Medicine

## 2022-05-25 DIAGNOSIS — C3492 Malignant neoplasm of unspecified part of left bronchus or lung: Secondary | ICD-10-CM

## 2022-05-25 NOTE — Telephone Encounter (Signed)
Requested medication (s) are due for refill today: routing for review   Requested medication (s) are on the active medication list: yes  Last refill:  03/17/22  Future visit scheduled: yes  Notes to clinic:  Note to Pharmacy:   Pt needs appt with HER endocrinologist at [facility], [place] for review     Requested Prescriptions  Pending Prescriptions Disp Refills   LANTUS SOLOSTAR 100 UNIT/ML Solostar Pen [Pharmacy Med Name: LANTUS SOLOSTAR 100 UNIT/ML]      Sig: PLEASE INJECT [place]     Endocrinology:  Diabetes - Insulins Failed - 05/25/2022  3:36 PM      Failed - HBA1C is between 0 and 7.9 and within 180 days    Hemoglobin A1C  Date Value Ref Range Status  01/25/2021 7.8  Final   Hgb A1c MFr Bld  Date Value Ref Range Status  08/25/2021 7.6 (H) 4.8 - 5.6 % Final    Comment:             Prediabetes: 5.7 - 6.4          Diabetes: >6.4          Glycemic control for adults with diabetes: <7.0          Failed - Valid encounter within last 6 months    Recent Outpatient Visits           9 months ago Primary hypertension   [facility] [place], Deanna C, MD   11 months ago Primary hypertension   [place] [facility] at [place], Deanna C, MD   1 year ago Chronic pain due to neoplasm   [facility] at [facility], MD   1 year ago Type 2 diabetes mellitus with diabetic peripheral angiopathy without gangrene, with long-term current use of insulin ([place])   [place] [facility] at [facility], MD   1 year ago Type 2 diabetes mellitus with diabetic peripheral angiopathy without gangrene, with long-term current use of insulin ([place])   [place] [facility] at [place], [place], MD       Future Appointments             In 3 weeks [name], MD [facility] at [facility], [facility]

## 2022-05-27 ENCOUNTER — Other Ambulatory Visit: Payer: Self-pay | Admitting: Family Medicine

## 2022-05-29 NOTE — Telephone Encounter (Signed)
Requested medication (s) are due for refill today: yes  Requested medication (s) are on the active medication list: historical provider  Last refill:  09/12/21 historical med  Future visit scheduled: yes  Notes to clinic:  historical med and provider   Requested Prescriptions  Pending Prescriptions Disp Refills   DULoxetine (CYMBALTA) 20 MG capsule [Pharmacy Med Name: DULOXETINE HCL DR 20 MG CAP] 120 capsule 2    Sig: TAKE 2 CAPSULES (40 MG TOTAL) BY MOUTH IN THE MORNING AND AT BEDTIME.     Psychiatry: Antidepressants - SNRI - duloxetine Failed - 05/27/2022  1:04 PM      Failed - Cr in normal range and within 360 days    Creatinine  Date Value Ref Range Status  04/02/2014 0.75 0.60 - 1.30 mg/dL Final   Creatinine, Ser  Date Value Ref Range Status  03/28/2022 1.11 (H) 0.44 - 1.00 mg/dL Final         Failed - Last BP in normal range    BP Readings from Last 1 Encounters:  04/24/22 (!) 163/91         Failed - Valid encounter within last 6 months    Recent Outpatient Visits           9 months ago Primary hypertension   [place] [facility] [place], [place], MD   11 months ago Primary hypertension   [place] [facility] at [place], Deanna C, MD   1 year ago Chronic pain due to neoplasm   [facility] at [facility], MD   1 year ago Type 2 diabetes mellitus with diabetic peripheral angiopathy without gangrene, with long-term current use of insulin ([place])   [place] [facility] at [facility], MD   1 year ago Type 2 diabetes mellitus with diabetic peripheral angiopathy without gangrene, with long-term current use of insulin ([place])   [place] [facility] at [place], [place], MD       Future Appointments             In 2 weeks [name], MD [facility]  Primary Care and Sports Medicine at [facility], [place] - eGFR is 30 or above and within 360 days    EGFR (African American)  Date Value Ref Range Status  04/02/2014 >60 >57m/min Final  11/11/2012 53 (L)  Final   GFR calc Af Amer  Date Value Ref Range Status  08/10/2020 57 (L) >59 mL/min/1.73 Final    Comment:    **In accordance with recommendations from the NKF-ASN Task force,**   [facility] is in the process of updating its eGFR calculation to the   2021 CKD-EPI creatinine equation that estimates kidney function   without a race variable.    EGFR (Non-African Amer.)  Date Value Ref Range Status  04/02/2014 >60 >676mmin Final    Comment:    eGFR values <6035min/1.73 m2 may be an indication of chronic kidney disease (CKD). Calculated eGFR, using the MRDR Study equation, is useful in  patients with stable renal function. The eGFR calculation will not be reliable in acutely ill patients when serum creatinine is changing rapidly. It is not useful in patients on dialysis. The eGFR calculation may not be applicable to patients at the  low and high extremes of body sizes, pregnant women, and vegetarians.   11/11/2012 46 (L)  Final    Comment:    eGFR values <40m/min/1.73 m2 may be an indication of chronic kidney disease (CKD). Calculated eGFR is useful in patients with stable renal function. The eGFR calculation will not be reliable in acutely ill patients when serum creatinine is changing rapidly. It is not useful in  patients on dialysis. The eGFR calculation may not be applicable to patients at the low and high extremes of body sizes, pregnant women, and vegetarians.    GFR, Estimated  Date Value Ref Range Status  03/28/2022 56 (L) >60 mL/min Final    Comment:    (NOTE) Calculated using the CKD-EPI Creatinine Equation (2021)          Passed - Completed PHQ-2 or PHQ-9 in the last 360 days

## 2022-05-30 ENCOUNTER — Ambulatory Visit: Payer: Medicaid Other | Admitting: Urology

## 2022-06-01 ENCOUNTER — Other Ambulatory Visit: Payer: Self-pay

## 2022-06-01 ENCOUNTER — Encounter: Payer: Self-pay | Admitting: Emergency Medicine

## 2022-06-01 ENCOUNTER — Emergency Department: Payer: Medicaid Other

## 2022-06-01 ENCOUNTER — Emergency Department
Admission: EM | Admit: 2022-06-01 | Discharge: 2022-06-01 | Disposition: A | Discharge status: Home / Self Care | Payer: Medicaid Other | Attending: Student in an Organized Health Care Education/Training Program | Admitting: Student in an Organized Health Care Education/Training Program

## 2022-06-01 ENCOUNTER — Encounter: Payer: Self-pay | Admitting: Oncology

## 2022-06-01 DIAGNOSIS — B974 Respiratory syncytial virus as the cause of diseases classified elsewhere: Secondary | ICD-10-CM | POA: Insufficient documentation

## 2022-06-01 DIAGNOSIS — Z20822 Contact with and (suspected) exposure to covid-19: Secondary | ICD-10-CM | POA: Insufficient documentation

## 2022-06-01 DIAGNOSIS — I10 Essential (primary) hypertension: Secondary | ICD-10-CM | POA: Insufficient documentation

## 2022-06-01 DIAGNOSIS — R059 Cough, unspecified: Secondary | ICD-10-CM | POA: Diagnosis not present

## 2022-06-01 DIAGNOSIS — J210 Acute bronchiolitis due to respiratory syncytial virus: Secondary | ICD-10-CM

## 2022-06-01 DIAGNOSIS — R0602 Shortness of breath: Secondary | ICD-10-CM | POA: Diagnosis not present

## 2022-06-01 DIAGNOSIS — Z859 Personal history of malignant neoplasm, unspecified: Secondary | ICD-10-CM | POA: Diagnosis not present

## 2022-06-01 DIAGNOSIS — J069 Acute upper respiratory infection, unspecified: Secondary | ICD-10-CM | POA: Insufficient documentation

## 2022-06-01 DIAGNOSIS — E119 Type 2 diabetes mellitus without complications: Secondary | ICD-10-CM | POA: Insufficient documentation

## 2022-06-01 DIAGNOSIS — R509 Fever, unspecified: Secondary | ICD-10-CM | POA: Diagnosis not present

## 2022-06-01 DIAGNOSIS — C3490 Malignant neoplasm of unspecified part of unspecified bronchus or lung: Secondary | ICD-10-CM | POA: Diagnosis not present

## 2022-06-01 LAB — RESP PANEL BY RT-PCR (RSV, FLU A&B, COVID)  RVPGX2
Influenza A by PCR: NEGATIVE
Influenza B by PCR: NEGATIVE
Resp Syncytial Virus by PCR: POSITIVE — AB
SARS Coronavirus 2 by RT PCR: NEGATIVE

## 2022-06-01 NOTE — ED Provider Notes (Signed)
[facility] Provider Note    Event Date/Time   First MD Initiated Contact with Patient 06/01/22 1429     (approximate)   History   Cough   HPI  [name] [name] is a 66 y.o. female with history of diabetes, MI, cancer, hypertension presents emergency department with cough and congestion along with fever.  Patient states that she been around other people and been sick.  Is been sick for 2 to 3 days.  Denies any chest pain/shortness of breath.      Physical Exam   Triage Vital Signs: ED Triage Vitals  Enc Vitals Group     BP 06/01/22 1343 (!) 151/90     Pulse Rate 06/01/22 1343 85     Resp 06/01/22 1343 17     Temp 06/01/22 1343 98.5 F (36.9 C)     Temp Source 06/01/22 1343 Oral     SpO2 06/01/22 1343 100 %     Weight 06/01/22 1343 115 lb (52.2 kg)     Height 06/01/22 1515 5\' 5"  (1.651 m)     Head Circumference --      Peak Flow --      Pain Score 06/01/22 1343 4     Pain Loc --      Pain Edu? --      Excl. in [place]? --     Most recent vital signs: Vitals:   06/01/22 1343  BP: (!) 151/90  Pulse: 85  Resp: 17  Temp: 98.5 F (36.9 C)  SpO2: 100%     General: Awake, no distress.   CV:  Good peripheral perfusion. regular rate and  rhythm Resp:  Normal effort. Lungs CTA Abd:  No distention.   Other:      ED Results / Procedures / Treatments   Labs (all labs ordered are listed, but only abnormal results are displayed) Labs Reviewed  RESP PANEL BY RT-PCR (RSV, FLU A&B, COVID)  RVPGX2 - Abnormal; Notable for the following components:      Result Value   Resp Syncytial Virus by PCR POSITIVE (*)    All other components within normal limits     EKG     RADIOLOGY Chest x-ray    PROCEDURES:   Procedures   MEDICATIONS ORDERED IN ED: Medications - No data to display   IMPRESSION / MDM / [place] / ED COURSE  I reviewed the triage vital signs and the nursing notes.                               Differential diagnosis includes, but is not limited to, COVID, influenza, CAP, acute URI, bronchitis  Patient's presentation is most consistent with acute complicated illness / injury requiring diagnostic workup.   Patient appears to be very stable.  Her chest x-ray was independently reviewed and interpreted by me as being negative for any acute abnormality.  I did explain these findings to the patient.  She would like for me to call her back with respiratory panel as she states she feels fine would like to go home.  I agree that she is stable and does not need further workup.  Will call her with test results   Rsv is positive  I did call the patient and defy her of the results.  She is to take over-the-counter cough medications.  I did caution her that if she feels she is getting worse  she should return the emergency department or see your regular doctor.  She is in agreement treatment plan.  She was stable at time of discharge.   FINAL CLINICAL IMPRESSION(S) / ED DIAGNOSES   Final diagnoses:  Acute URI  RSV (acute bronchiolitis due to respiratory syncytial virus)     Rx / DC Orders   ED Discharge Orders     None        Note:  This document was prepared using Dragon voice recognition software and may include unintentional dictation errors.    Versie Starks, PA-C 06/01/22 1631    Merlyn Lot, MD 06/01/22 1929

## 2022-06-01 NOTE — ED Triage Notes (Signed)
Pt sts that she was around a 21 month old that had RSV all week and is not sick. Pt sts that she has been coughing with body cramps.

## 2022-06-01 NOTE — ED Provider Triage Note (Signed)
Emergency Medicine Provider Triage Evaluation Note  [name] [name] , a 66 y.o. female  was evaluated in triage.  Pt complains of cough, fever since Monday.  Review of Systems  Positive:  Negative:   Physical Exam  BP (!) 151/90 (BP Location: Left Arm)   Pulse 85   Temp 98.5 F (36.9 C) (Oral)   Resp 17   Wt 52.2 kg   SpO2 100%   BMI 19.14 kg/m  Gen:   Awake, no distress   Resp:  Normal effort  MSK:   Moves extremities without difficulty  Other:    Medical Decision Making  Medically screening exam initiated at 1:48 PM.  Appropriate orders placed.  [name] [name] was informed that the remainder of the evaluation will be completed by another provider, this initial triage assessment does not replace that evaluation, and the importance of remaining in the ED until their evaluation is complete.     Versie Starks, PA-C 06/01/22 1348

## 2022-06-01 NOTE — Discharge Instructions (Signed)
Follow-up with Dr. Ronnald Ramp.  Please call for an appointment.  I will call you with your respiratory panel results we can see them on MyChart.  Return emergency department if worsening

## 2022-06-02 ENCOUNTER — Telehealth: Payer: Self-pay

## 2022-06-02 NOTE — Patient Outreach (Signed)
Transition Care Management Unsuccessful Follow-up Telephone Call  Date of discharge and from where:  06/01/22 [facility]  Attempts:  1st Attempt  Reason for unsuccessful TCM follow-up call:  Left voice message  Mickel Fuchs, BSW, [place] [employer] Team  [phone]

## 2022-06-04 IMAGING — CT CT PERC PLEURAL DRAIN W/INDWELL CATH W/IMG GUIDE
1 of 4 series · 13 of 32 positions shown, 19 images · non-contrast
Comparison: none

INDICATION: Recurrent left hydropneumothorax status post left lower lobectomy.

[Series 2: i-spiral 5.0 b40f · axial · 0.87mm/px · z∈[-294,-91]mm · 13 of 68 slices shown, 19 images]
[im 5/68  soft-tissue]
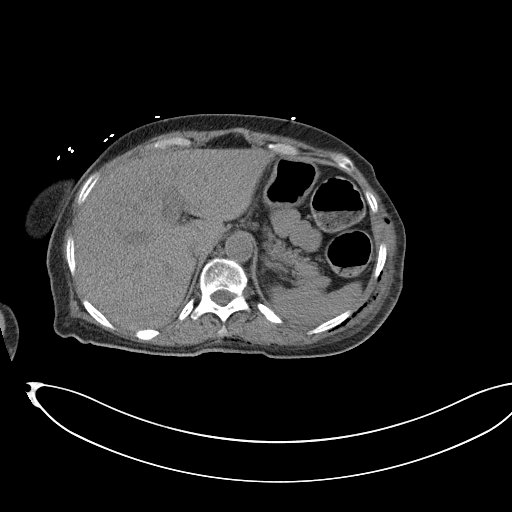
[im 5/68  bone]
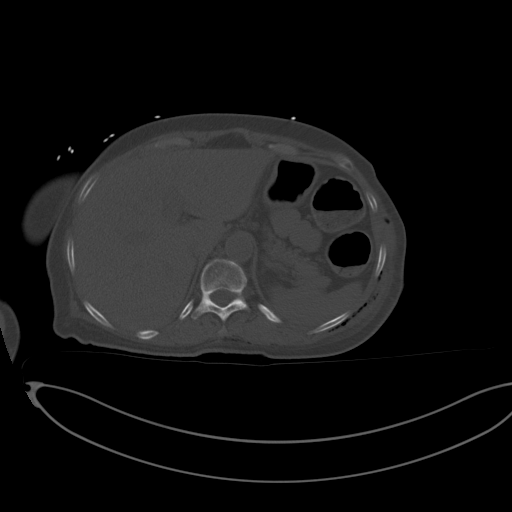
[im 10/68  soft-tissue]
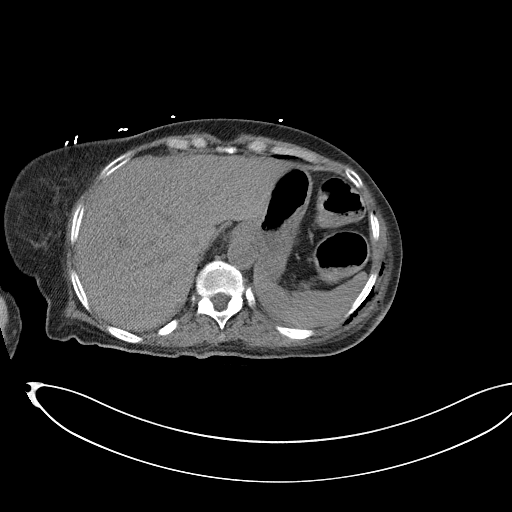
[im 15/68  soft-tissue]
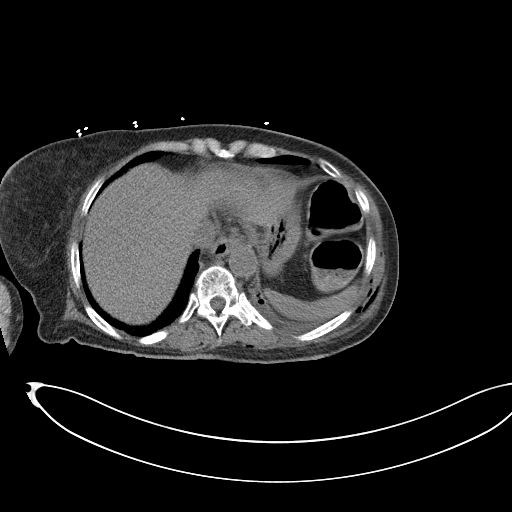
[im 20/68  soft-tissue]
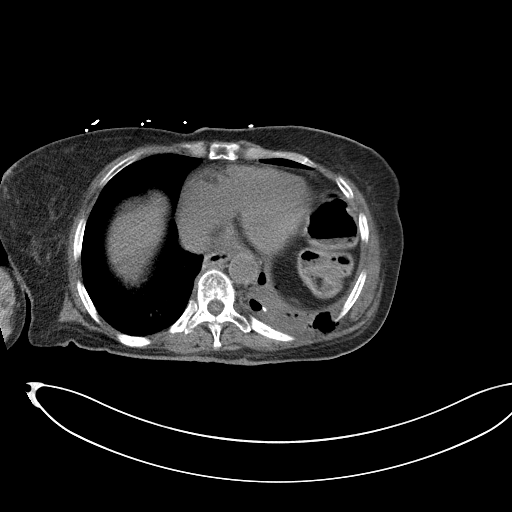
[im 24/68  soft-tissue]
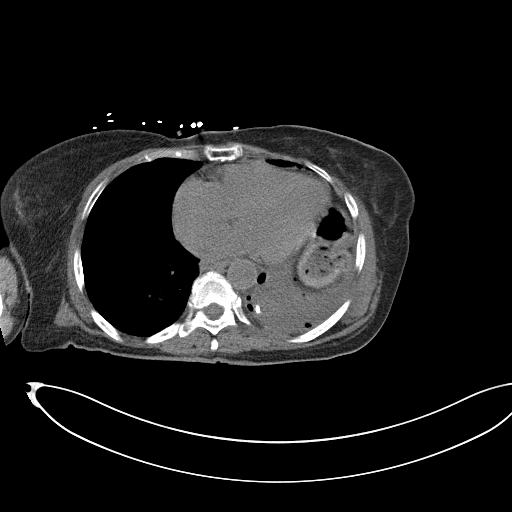
[im 29/68  soft-tissue]
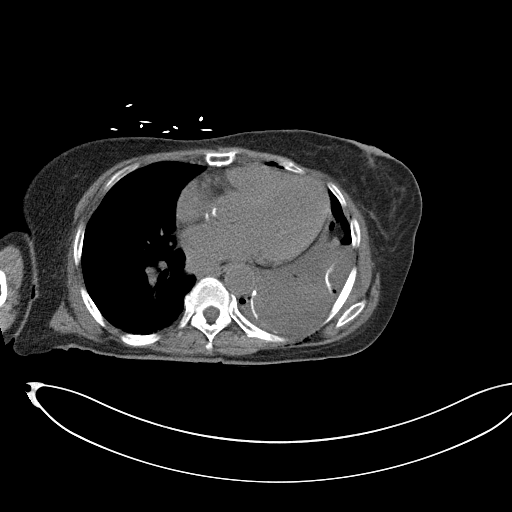
[im 34/68  soft-tissue]
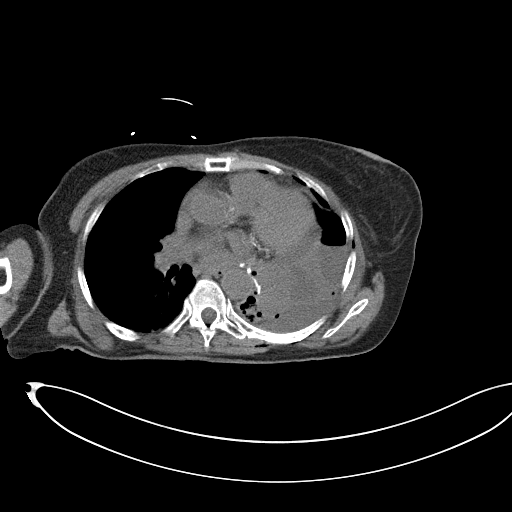
[im 39/68  soft-tissue]
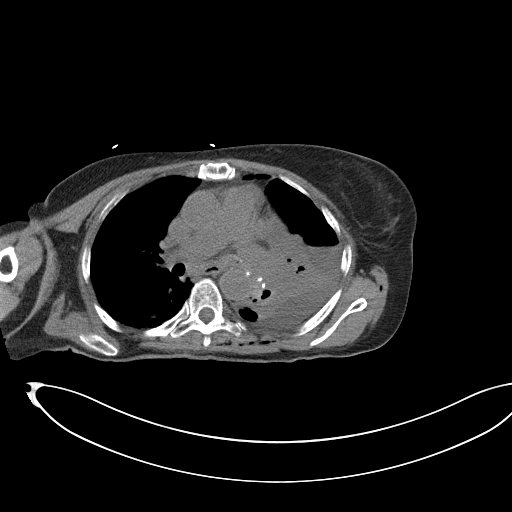
[im 44/68  soft-tissue]
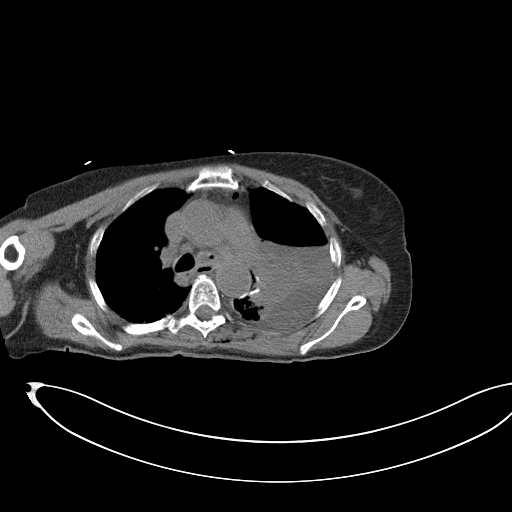
[im 44/68  bone]
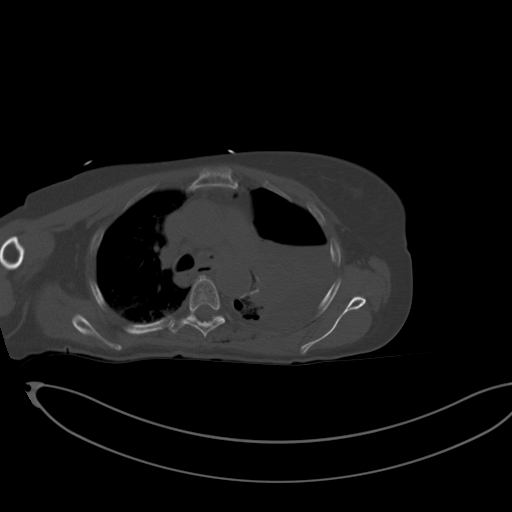
[im 48/68  soft-tissue]
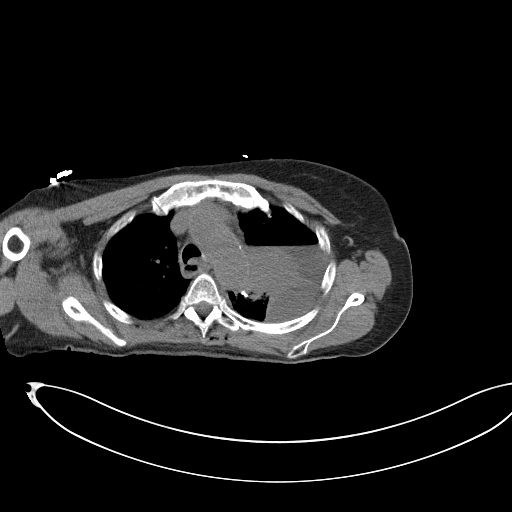
[im 48/68  lung]
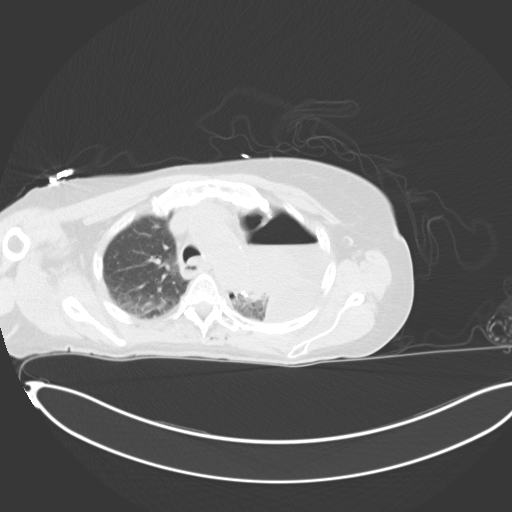
[im 53/68  soft-tissue]
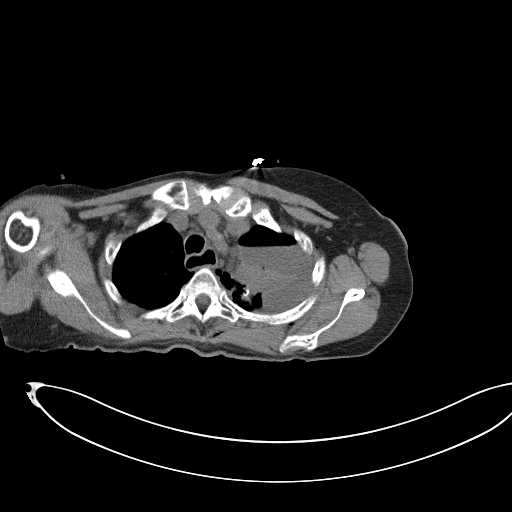
[im 53/68  lung]
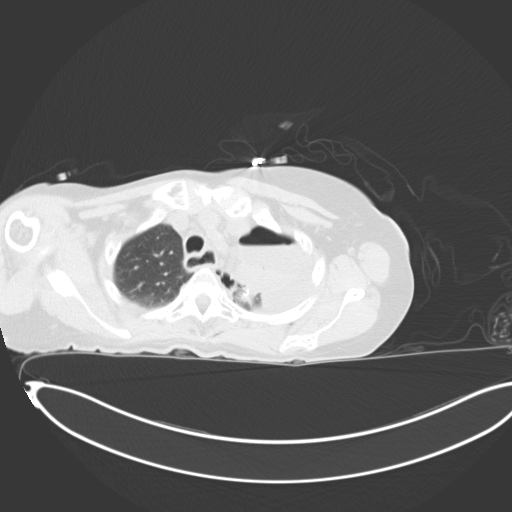
[im 58/68  soft-tissue]
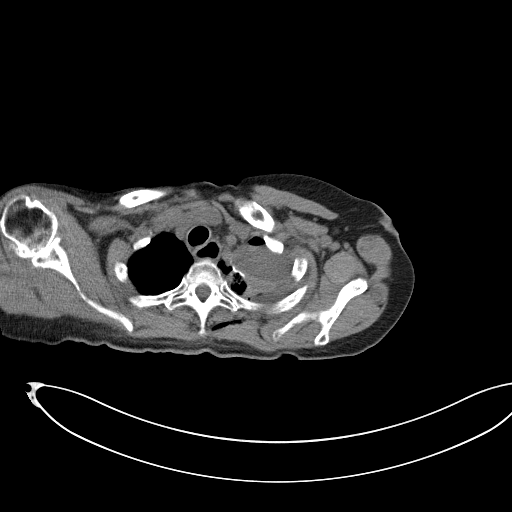
[im 58/68  lung]
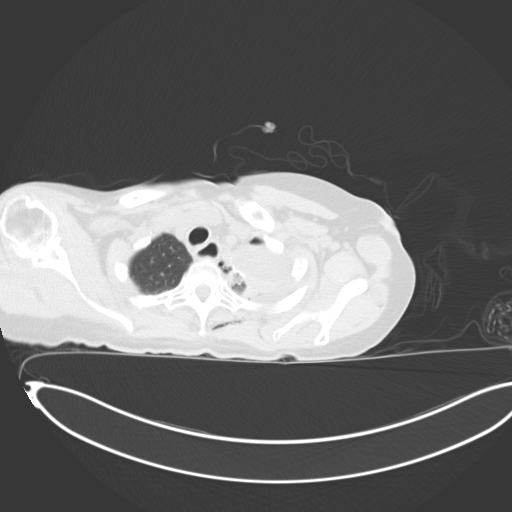
[im 63/68  soft-tissue]
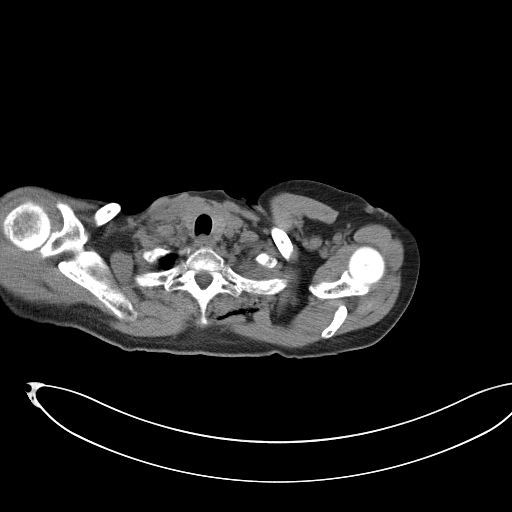
[im 63/68  lung]
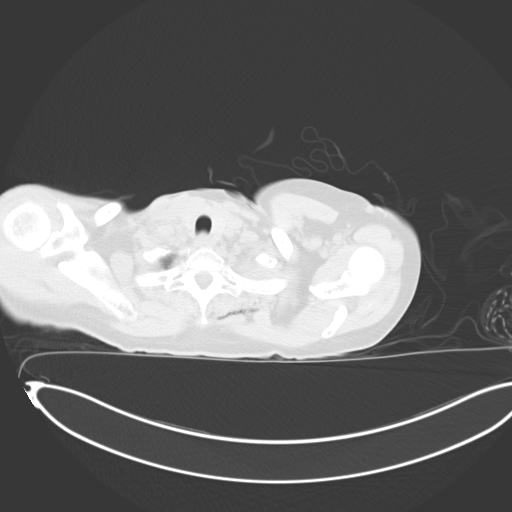

[13 of 32 positions shown; findings below may reference images not displayed]

EXAM:
CT-guided left chest tube placement

MEDICATIONS:
The patient is currently admitted to the hospital and receiving
intravenous antibiotics. The antibiotics were administered within an
appropriate time frame prior to the initiation of the procedure.

ANESTHESIA/SEDATION:
Fentanyl 75 mcg IV

Moderate Sedation Time:  16 minutes

The patient was continuously monitored during the procedure by the
interventional radiology nurse under my direct supervision.

COMPLICATIONS:
None immediate.

PROCEDURE:
Informed written consent was obtained from the patient after a
thorough discussion of the procedural risks, benefits and
alternatives. All questions were addressed. Maximal Sterile Barrier
Technique was utilized including caps, mask, sterile gowns, sterile
gloves, sterile drape, hand hygiene and skin antiseptic. A timeout
was performed prior to the initiation of the procedure.

Patient positioned supine on the procedure table. The left anterior
chest wall skin prepped and draped in usual sterile fashion.

Following local lidocaine administration, 17 gauge introducer needle
was advanced into the left hydropneumothorax utilizing CT guidance.
17 gauge needle removed over 0.035 inch guidewire. Serial dilation
was performed and 14 French drain was inserted. Drain secured to
skin with suture and connected to Pleur-Evac.

Patient tolerated procedure well without complication.
IMPRESSION: 14 French left chest tube placed utilizing CT guidance.

## 2022-06-05 ENCOUNTER — Encounter: Payer: Self-pay | Admitting: Oncology

## 2022-06-06 ENCOUNTER — Encounter: Payer: Self-pay | Admitting: Oncology

## 2022-06-06 ENCOUNTER — Ambulatory Visit: Payer: Medicaid Other | Admitting: Urology

## 2022-06-06 ENCOUNTER — Encounter: Payer: Self-pay | Admitting: Urology

## 2022-06-06 VITALS — BP 144/84 | HR 64 | Ht 65.0 in | Wt 109.6 lb

## 2022-06-06 DIAGNOSIS — Z87440 Personal history of urinary (tract) infections: Secondary | ICD-10-CM

## 2022-06-06 DIAGNOSIS — N390 Urinary tract infection, site not specified: Secondary | ICD-10-CM

## 2022-06-06 DIAGNOSIS — N3281 Overactive bladder: Secondary | ICD-10-CM

## 2022-06-06 LAB — BLADDER SCAN AMB NON-IMAGING

## 2022-06-06 NOTE — Progress Notes (Signed)
   06/06/2022 10:08 AM   [name] [name] 11/10/1955 [id number]  Reason for visit: Follow up recurrent UTI, overactive bladder  HPI: 66 year old female with diabetes(hemoglobin A1c 7.6) as well as history of lung cancer treated with surgery and chemotherapy who has a history of recurrent UTI and overactive bladder as well as nocturia.  In the setting of her recurrent infections, persistent microscopic hematuria and extensive smoking history she underwent a cystoscopy and CTU that showed no evidence of urologic abnormalities.  She denies any problems since her last visit, specifically no UTIs, urinary symptoms, nocturia has essentially resolved.  She has no urologic complaints today.  I personally viewed and interpreted the most recent CT from October 2023 that shows no hydronephrosis, stones, or other urologic abnormalities.  Behavioral strategies discussed, can follow-up with urology as needed  [place], [place] [address], [name], [place] [address] [phone]

## 2022-06-09 DIAGNOSIS — E1151 Type 2 diabetes mellitus with diabetic peripheral angiopathy without gangrene: Secondary | ICD-10-CM | POA: Diagnosis not present

## 2022-06-09 DIAGNOSIS — Z794 Long term (current) use of insulin: Secondary | ICD-10-CM | POA: Diagnosis not present

## 2022-06-09 LAB — LIPID PANEL
Cholesterol: 248 — AB (ref 0–200)
HDL: 70 (ref 35–70)
LDL Cholesterol: 158
Triglycerides: 98 (ref 40–160)

## 2022-06-09 LAB — BASIC METABOLIC PANEL
Creatinine: 1.3 — AB (ref 0.5–1.1)
Glucose: 97
Potassium: 4.1 mEq/L (ref 3.5–5.1)
Sodium: 141 (ref 137–147)

## 2022-06-09 LAB — VITAMIN B12: Vitamin B-12: 328

## 2022-06-09 LAB — MICROALBUMIN / CREATININE URINE RATIO: Microalb Creat Ratio: 6469.8

## 2022-06-09 LAB — HEMOGLOBIN A1C: Hemoglobin A1C: 7.4

## 2022-06-15 ENCOUNTER — Ambulatory Visit: Payer: Medicaid Other | Admitting: Family Medicine

## 2022-06-20 ENCOUNTER — Ambulatory Visit (INDEPENDENT_AMBULATORY_CARE_PROVIDER_SITE_OTHER): Payer: Medicaid Other | Admitting: Family Medicine

## 2022-06-20 ENCOUNTER — Encounter: Payer: Self-pay | Admitting: Family Medicine

## 2022-06-20 VITALS — BP 144/82 | HR 82 | Ht 65.0 in | Wt 112.0 lb

## 2022-06-20 DIAGNOSIS — I25118 Atherosclerotic heart disease of native coronary artery with other forms of angina pectoris: Secondary | ICD-10-CM | POA: Diagnosis not present

## 2022-06-20 DIAGNOSIS — N870 Mild cervical dysplasia: Secondary | ICD-10-CM

## 2022-06-20 DIAGNOSIS — N3090 Cystitis, unspecified without hematuria: Secondary | ICD-10-CM

## 2022-06-20 DIAGNOSIS — I10 Essential (primary) hypertension: Secondary | ICD-10-CM | POA: Diagnosis not present

## 2022-06-20 LAB — POCT URINALYSIS DIPSTICK
Bilirubin, UA: NEGATIVE
Glucose, UA: NEGATIVE
Ketones, UA: NEGATIVE
Nitrite, UA: NEGATIVE
Protein, UA: POSITIVE — AB
Spec Grav, UA: <=1.005 — AB (ref 1.010–1.025)
Urobilinogen, UA: 0.2 E.U./dL
pH, UA: 6 (ref 5.0–8.0)

## 2022-06-20 MED ORDER — SPIRONOLACTONE 25 MG PO TABS: 25.0000 mg | ORAL_TABLET | Freq: Every day | ORAL | 1 refills | Status: DC

## 2022-06-20 MED ORDER — METOPROLOL SUCCINATE ER 50 MG PO TB24: 50.0000 mg | ORAL_TABLET | Freq: Every day | ORAL | 1 refills | Status: DC

## 2022-06-20 MED ORDER — NITROFURANTOIN MONOHYD MACRO 100 MG PO CAPS: 100.0000 mg | ORAL_CAPSULE | Freq: Two times a day (BID) | ORAL | 0 refills | Status: AC

## 2022-06-20 NOTE — Progress Notes (Signed)
Date:  06/20/2022   Name:  ALAISHA [name]   DOB:  February 17, 1956   MRN:  [id number]   Chief Complaint: foot exam, urine frequency (Getting up and down to pee), and Hypertension  Hypertension This is a chronic problem. The current episode started more than 1 year ago. The problem has been waxing and waning since onset. The problem is uncontrolled. Pertinent negatives include no blurred vision, chest pain, orthopnea, palpitations, PND or shortness of breath. There are no associated agents to hypertension. Risk factors for coronary artery disease include dyslipidemia. Past treatments include diuretics and beta blockers (isosorbide). Compliance problems: did not take medication today.     Lab Results  Component Value Date   NA 141 06/09/2022   K 4.1 06/09/2022   CO2 26 03/28/2022   GLUCOSE 92 03/28/2022   BUN 22 03/28/2022   CREATININE 1.3 (A) 06/09/2022   CALCIUM 8.2 (L) 03/28/2022   GFRNONAA 56 (L) 03/28/2022   Lab Results  Component Value Date   CHOL 248 (A) 06/09/2022   HDL 70 06/09/2022   LDLCALC 158 06/09/2022   TRIG 98 06/09/2022   Lab Results  Component Value Date   TSH 2.055 08/17/2021   Lab Results  Component Value Date   HGBA1C 7.4 06/09/2022   Lab Results  Component Value Date   WBC 5.2 03/28/2022   HGB 8.7 (L) 03/28/2022   HCT 27.8 (L) 03/28/2022   MCV 87.7 03/28/2022   PLT 430 (H) 03/28/2022   Lab Results  Component Value Date   ALT 12 03/28/2022   AST 21 03/28/2022   ALKPHOS 108 03/28/2022   BILITOT 0.3 03/28/2022   No results found for: "25OHVITD2", "25OHVITD3", "VD25OH"   Review of Systems  Eyes:  Negative for blurred vision.  Respiratory:  Positive for wheezing. Negative for shortness of breath.   Cardiovascular:  Negative for chest pain, palpitations, orthopnea, leg swelling and PND.  Gastrointestinal:  Negative for abdominal pain.  Endocrine: Positive for polyuria.  Genitourinary:  Positive for frequency and urgency. Negative for dysuria.     Patient Active Problem List   Diagnosis Date Noted   Hypokalemia 12/26/2021   Debility    Liver function test abnormality    Hypovolemic shock ([name] [name]) 08/03/2021   AKI (acute kidney injury) ([name] [name]) 08/03/2021   Transaminitis 08/02/2021   Chemotherapy-induced nausea 07/28/2021   Encounter for antineoplastic immunotherapy 07/07/2021   Macrocytic anemia 07/07/2021   Moderate protein-calorie malnutrition ([name] [name]) 04/12/2021   Stage 3b chronic kidney disease ([name] [name]) 04/12/2021   Anemia in chronic kidney disease (CKD) 04/12/2021   Dyslipidemia 04/05/2021   Encounter for antineoplastic chemotherapy 02/28/2021   Non-small cell cancer of left lung ([name] [name]) 02/17/2021   Tinea pedis of both feet 02/10/2021   Chronic pain due to neoplasm 02/10/2021   Adenocarcinoma, lung, left ([name] [name]) 01/19/2021   S/P Robotic Assisted Video Thoracoscopy with Left Lower Lobectomy Lung, Intercostal nerve block, lymph node dissection 01/17/2021   Non-small cell lung cancer ([name] [name]) 12/30/2020   Depression 12/09/2020   Hyperlipidemia 12/09/2020   Primary hypertension 12/09/2020   Heart failure with reduced ejection fraction ([name] [name]) 02/16/2020   Multiple subsegmental pulmonary emboli without acute cor pulmonale ([name] [name]) 02/15/2020   Iron deficiency anemia 12/18/2019   Rectal pain 12/18/2019   Diabetic foot ulcer ([name] [name]) 10/03/2019   Cellulitis 08/17/2019   Cocaine use 08/17/2019   Peripheral vascular disease ([name] [name]) 08/17/2019   Tobacco use disorder 08/17/2019   Tobacco abuse, in remission 08/17/2019   Cervical dysplasia 04/02/2019  CAD (coronary artery disease), native coronary artery 06/06/2010   Type II diabetes mellitus ([name] [name]) 06/06/2010   Acute subendocardial infarction, initial episode of care [name]) 06/05/2010    Allergies  Allergen Reactions   Lisinopril Swelling and Other (See Comments)    Recurrent AKI and hyperkalemia when initiation attempted.    Past Surgical History:  Procedure Laterality Date   IR IMAGING  GUIDED PORT INSERTION  07/27/2021   Right lower extremity BKA Right     Social History   Tobacco Use   Smoking status: Former    Packs/day: 0.25    Types: Cigars, Cigarettes    Quit date: 01/17/2021    Years since quitting: 1.4   Smokeless tobacco: Never  Vaping Use   Vaping Use: Never used  Substance Use Topics   Alcohol use: Never   Drug use: Not Currently     Medication list has been reviewed and updated.  Current Meds  Medication Sig   ACCU-CHEK GUIDE test strip USE UP TO 4 TIMES A DAY AS DIRECTED   Accu-Chek Softclix Lancets lancets SMARTSIG:Topical 1-4 Times Daily   aspirin EC 81 MG tablet Take 81 mg by mouth in the morning. Swallow whole.   blood glucose meter kit and supplies KIT Dispense based on patient and insurance preference. Use up to four times daily as directed.   carboxymethylcellulose (REFRESH PLUS) 0.5 % SOLN Place 1 drop into both eyes in the morning and at bedtime.   diphenhydrAMINE (BENADRYL) 25 MG tablet Take 25 mg by mouth at bedtime.   docusate sodium (COLACE) 100 MG capsule Take 1 capsule (100 mg total) by mouth 2 (two) times daily.   DULoxetine (CYMBALTA) 20 MG capsule SMARTSIG:2 Capsule(s) By Mouth Morning-Night   gabapentin (NEURONTIN) 100 MG capsule Take 1 capsule (100 mg total) by mouth 2 (two) times daily.   HUMALOG KWIKPEN 100 UNIT/ML KwikPen Inject 8 Units into the skin 3 (three) times daily after meals.   isosorbide dinitrate (ISORDIL) 30 MG tablet Take 1 tablet (30 mg total) by mouth 3 (three) times daily.   ketoconazole (NIZORAL) 2 % cream APPLY 1 APPLICATION TOPICALLY IN THE MORNING AND AT BEDTIME. APPLIED TO FEET   LANTUS SOLOSTAR 100 UNIT/ML Solostar Pen INJECT 20 UNITS INTO THE SKIN AT BEDTIME.   lidocaine-prilocaine (EMLA) cream Apply 1 application topically as needed. Apply to port and cover with saran wrap 1-2 hours prior to port access   metFORMIN (GLUCOPHAGE-XR) 500 MG 24 hr tablet TAKE 1 TABLET BY MOUTH TWICE A DAY   metoprolol  succinate (TOPROL-XL) 50 MG 24 hr tablet Take 1 tablet (50 mg total) by mouth daily. Take with or immediately following a meal.   Multiple Vitamin (MULTIVITAMIN WITH MINERALS) TABS tablet Take 1 tablet by mouth in the morning. Adults 50+   ondansetron (ZOFRAN) 8 MG tablet TAKE 1 TABLET 2 TIMES DAILY AS NEEDED (NAUSEA/VOMITING). START IF NEEDED ON 3RD DAY AFTER CISPLATIN.   pantoprazole (PROTONIX) 40 MG tablet Take 1 tablet (40 mg total) by mouth daily.   potassium chloride SA (KLOR-CON M) 20 MEQ tablet Take 1 tablet (20 mEq total) by mouth daily.   prochlorperazine (COMPAZINE) 10 MG tablet TAKE 1 TABLET (10 MG TOTAL) BY MOUTH EVERY 6 (SIX) HOURS AS NEEDED (NAUSEA OR VOMITING).   spironolactone (ALDACTONE) 25 MG tablet Take 1 tablet (25 mg total) by mouth daily.       06/20/2022    1:30 PM 02/10/2021    2:54 PM 08/10/2020    4:22  PM 07/15/2020    2:49 PM  GAD 7 : Generalized Anxiety Score  Nervous, Anxious, on Edge 0 0 0 0  Control/stop worrying 0 0 0 0  Worry too much - different things 0 0 1 0  Trouble relaxing 0 0 0 0  Restless 0 0 0 0  Easily annoyed or irritable 0 1 0 0  Afraid - awful might happen 0 0 0 0  Total GAD 7 Score 0 1 1 0  Anxiety Difficulty Not difficult at all Not difficult at all         06/20/2022    1:30 PM 12/15/2021    1:58 PM 06/15/2021   11:55 AM  Depression screen PHQ 2/9  Decreased Interest 0 0 0  Down, Depressed, Hopeless 0 0 0  PHQ - 2 Score 0 0 0  Altered sleeping 0  0  Tired, decreased energy 0  0  Change in appetite 0  0  Feeling bad or failure about yourself  0  0  Trouble concentrating 0  0  Moving slowly or fidgety/restless 0  0  Suicidal thoughts 0  0  PHQ-9 Score 0  0  Difficult doing work/chores Not difficult at all      BP Readings from Last 3 Encounters:  06/20/22 (!) 144/82  06/06/22 (!) 144/84  06/01/22 (!) 151/90    Physical Exam HENT:     Head: Normocephalic.     Right Ear: Tympanic membrane and ear canal normal.     Left  Ear: Tympanic membrane and ear canal normal.     Nose: Nose normal.     Mouth/Throat:     Mouth: Mucous membranes are moist.  Cardiovascular:     Rate and Rhythm: Normal rate and regular rhythm.     Pulses:          Dorsalis pedis pulses are 1+ on the left side.       Posterior tibial pulses are 1+ on the left side.     Heart sounds: S1 normal and S2 normal. Murmur heard.     Systolic murmur is present with a grade of 1/6.     No diastolic murmur is present.     No gallop. No S3 or S4 sounds.  Pulmonary:     Breath sounds: No wheezing, rhonchi or rales.  Abdominal:     Tenderness: There is abdominal tenderness in the suprapubic area. There is no right CVA tenderness or left CVA tenderness.  Musculoskeletal:     Cervical back: Neck supple.     Left lower leg: 1+ Edema present.  Neurological:     Mental Status: She is alert.     Wt Readings from Last 3 Encounters:  06/20/22 112 lb (50.8 kg)  06/06/22 109 lb 9.6 oz (49.7 kg)  06/01/22 116 lb 13.5 oz (53 kg)    BP (!) 144/82 (BP Location: Right Arm, Cuff Size: Normal)   Pulse 82   Ht _0  (1.651 m)   Wt 112 lb (50.8 kg)   SpO2 99%   BMI 18.64 kg/m   Assessment and Plan:  1. Primary hypertension Chronic.  Uncontrolled.  Stable.  Patient did not take her medication prior to coming because she takes it with meals and she has not had anything to eat today.  I have been informed patient that in the future that she needs to eat something even on the nature of some crackers and to take her blood pressure medicine to control  blood pressure at acceptable range.  We will resume metoprolol XL 50 mg once a day and spironolactone 25 mg daily.  Patient is also seen by cardiology and they have been handling her isosorbide medication. - metoprolol succinate (TOPROL-XL) 50 MG 24 hr tablet; Take 1 tablet (50 mg total) by mouth daily. Take with or immediately following a meal.  Dispense: 90 tablet; Refill: 1  2. Coronary artery disease of  native heart with stable angina pectoris, unspecified vessel or lesion type (HCC) Chronic.  Controlled.  Stable.  Followed by cardiology.  Will resume spironolactone 25 mg daily. - spironolactone (ALDACTONE) 25 MG tablet; Take 1 tablet (25 mg total) by mouth daily.  Dispense: 90 tablet; Refill: 1  3. 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  Patient has not followed up with reappointment and patient said that she will call that number as previously given for setting up for follow-up colposcopy/cervical dysplasia guideline protocol.  4. Cystitis New onset.  Patient with dysuria urgency frequency last week which is gradually improved but on examination there is tenderness of the suprapubic area.  Obtaining of the urinalysis notes leukocytes and erythrocytes.  Patient likely has a uncomplicated cystitis for which we will treat with Macrobid 100 mg twice a day for 7 days.  Patient is to return to clinic if there is any continuance of symptoms or repeat in the next few weeks. - nitrofurantoin, macrocrystal-monohydrate, (MACROBID) 100 MG capsule; Take 1 capsule (100 mg total) by mouth 2 (two) times daily for 7 days.  Dispense: 14 capsule; Refill: 0 - POCT urinalysis dipstick    Otilio Miu, MD

## 2022-07-03 ENCOUNTER — Inpatient Hospital Stay: Payer: Medicaid Other | Attending: Oncology

## 2022-07-03 ENCOUNTER — Inpatient Hospital Stay: Payer: Medicaid Other

## 2022-07-03 ENCOUNTER — Ambulatory Visit
Admission: RE | Admit: 2022-07-03 | Discharge: 2022-07-03 | Disposition: A | Discharge status: Home / Self Care | Payer: Medicaid Other | Source: Ambulatory Visit | Attending: Oncology | Admitting: Oncology

## 2022-07-03 DIAGNOSIS — D631 Anemia in chronic kidney disease: Secondary | ICD-10-CM

## 2022-07-03 DIAGNOSIS — I700 Atherosclerosis of aorta: Secondary | ICD-10-CM | POA: Diagnosis not present

## 2022-07-03 DIAGNOSIS — N1831 Chronic kidney disease, stage 3a: Secondary | ICD-10-CM

## 2022-07-03 DIAGNOSIS — N1832 Chronic kidney disease, stage 3b: Secondary | ICD-10-CM | POA: Insufficient documentation

## 2022-07-03 DIAGNOSIS — Z87891 Personal history of nicotine dependence: Secondary | ICD-10-CM | POA: Insufficient documentation

## 2022-07-03 DIAGNOSIS — C3492 Malignant neoplasm of unspecified part of left bronchus or lung: Secondary | ICD-10-CM

## 2022-07-03 DIAGNOSIS — E440 Moderate protein-calorie malnutrition: Secondary | ICD-10-CM | POA: Insufficient documentation

## 2022-07-03 DIAGNOSIS — Z86718 Personal history of other venous thrombosis and embolism: Secondary | ICD-10-CM | POA: Insufficient documentation

## 2022-07-03 DIAGNOSIS — Z833 Family history of diabetes mellitus: Secondary | ICD-10-CM | POA: Insufficient documentation

## 2022-07-03 DIAGNOSIS — Z79899 Other long term (current) drug therapy: Secondary | ICD-10-CM | POA: Insufficient documentation

## 2022-07-03 DIAGNOSIS — K5730 Diverticulosis of large intestine without perforation or abscess without bleeding: Secondary | ICD-10-CM | POA: Diagnosis not present

## 2022-07-03 DIAGNOSIS — I2510 Atherosclerotic heart disease of native coronary artery without angina pectoris: Secondary | ICD-10-CM | POA: Insufficient documentation

## 2022-07-03 DIAGNOSIS — Z8249 Family history of ischemic heart disease and other diseases of the circulatory system: Secondary | ICD-10-CM | POA: Diagnosis not present

## 2022-07-03 DIAGNOSIS — Z86711 Personal history of pulmonary embolism: Secondary | ICD-10-CM | POA: Diagnosis not present

## 2022-07-03 DIAGNOSIS — Z888 Allergy status to other drugs, medicaments and biological substances status: Secondary | ICD-10-CM | POA: Insufficient documentation

## 2022-07-03 DIAGNOSIS — C3490 Malignant neoplasm of unspecified part of unspecified bronchus or lung: Secondary | ICD-10-CM | POA: Diagnosis not present

## 2022-07-03 DIAGNOSIS — E876 Hypokalemia: Secondary | ICD-10-CM

## 2022-07-03 DIAGNOSIS — C3432 Malignant neoplasm of lower lobe, left bronchus or lung: Secondary | ICD-10-CM | POA: Diagnosis not present

## 2022-07-03 DIAGNOSIS — J479 Bronchiectasis, uncomplicated: Secondary | ICD-10-CM | POA: Diagnosis not present

## 2022-07-03 DIAGNOSIS — I130 Hypertensive heart and chronic kidney disease with heart failure and stage 1 through stage 4 chronic kidney disease, or unspecified chronic kidney disease: Secondary | ICD-10-CM | POA: Diagnosis not present

## 2022-07-03 DIAGNOSIS — Z902 Acquired absence of lung [part of]: Secondary | ICD-10-CM | POA: Insufficient documentation

## 2022-07-03 DIAGNOSIS — I252 Old myocardial infarction: Secondary | ICD-10-CM | POA: Diagnosis not present

## 2022-07-03 DIAGNOSIS — Z89511 Acquired absence of right leg below knee: Secondary | ICD-10-CM | POA: Diagnosis not present

## 2022-07-03 DIAGNOSIS — E785 Hyperlipidemia, unspecified: Secondary | ICD-10-CM | POA: Insufficient documentation

## 2022-07-03 DIAGNOSIS — E538 Deficiency of other specified B group vitamins: Secondary | ICD-10-CM | POA: Insufficient documentation

## 2022-07-03 DIAGNOSIS — D735 Infarction of spleen: Secondary | ICD-10-CM | POA: Diagnosis not present

## 2022-07-03 LAB — RETIC PANEL
Immature Retic Fract: 7.4 % (ref 2.3–15.9)
RBC.: 3.41 MIL/uL — ABNORMAL LOW (ref 3.87–5.11)
Retic Count, Absolute: 35.8 10*3/uL (ref 19.0–186.0)
Retic Ct Pct: 1.1 % (ref 0.4–3.1)
Reticulocyte Hemoglobin: 31.4 pg (ref >27.9)

## 2022-07-03 LAB — COMPREHENSIVE METABOLIC PANEL
ALT: 12 U/L (ref 0–44)
AST: 19 U/L (ref 15–41)
Albumin: 2.6 g/dL — ABNORMAL LOW (ref 3.5–5.0)
Alkaline Phosphatase: 107 U/L (ref 38–126)
Anion gap: 6 (ref 5–15)
BUN: 25 mg/dL — ABNORMAL HIGH (ref 8–23)
CO2: 22 mmol/L (ref 22–32)
Calcium: 8.1 mg/dL — ABNORMAL LOW (ref 8.9–10.3)
Chloride: 111 mmol/L (ref 98–111)
Creatinine, Ser: 2.42 mg/dL — ABNORMAL HIGH (ref 0.44–1.00)
GFR, Estimated: 22 mL/min — ABNORMAL LOW (ref >60)
Glucose, Bld: 92 mg/dL (ref 70–99)
Potassium: 4.5 mmol/L (ref 3.5–5.1)
Sodium: 139 mmol/L (ref 135–145)
Total Bilirubin: <0.1 mg/dL — ABNORMAL LOW (ref 0.3–1.2)
Total Protein: 5.4 g/dL — ABNORMAL LOW (ref 6.5–8.1)

## 2022-07-03 LAB — LACTATE DEHYDROGENASE: LDH: 182 U/L (ref 98–192)

## 2022-07-03 LAB — VITAMIN B12: Vitamin B-12: 273 pg/mL (ref 180–914)

## 2022-07-03 LAB — CBC
HCT: 33.3 % — ABNORMAL LOW (ref 36.0–46.0)
Hemoglobin: 10.5 g/dL — ABNORMAL LOW (ref 12.0–15.0)
MCH: 30.5 pg (ref 26.0–34.0)
MCHC: 31.5 g/dL (ref 30.0–36.0)
MCV: 96.8 fL (ref 80.0–100.0)
Platelets: 317 10*3/uL (ref 150–400)
RBC: 3.44 MIL/uL — ABNORMAL LOW (ref 3.87–5.11)
RDW: 14.9 % (ref 11.5–15.5)
WBC: 4.9 10*3/uL (ref 4.0–10.5)
nRBC: 0 % (ref 0.0–0.2)

## 2022-07-03 LAB — FOLATE: Folate: 7.8 ng/mL (ref >5.9)

## 2022-07-04 ENCOUNTER — Encounter: Payer: Self-pay | Admitting: Oncology

## 2022-07-10 ENCOUNTER — Inpatient Hospital Stay (HOSPITAL_BASED_OUTPATIENT_CLINIC_OR_DEPARTMENT_OTHER): Payer: Medicaid Other | Admitting: Oncology

## 2022-07-10 ENCOUNTER — Encounter: Payer: Self-pay | Admitting: Oncology

## 2022-07-10 ENCOUNTER — Inpatient Hospital Stay: Payer: Medicaid Other

## 2022-07-10 ENCOUNTER — Other Ambulatory Visit: Payer: Self-pay | Admitting: Oncology

## 2022-07-10 VITALS — BP 104/60 | HR 69 | Temp 97.2°F | Resp 18

## 2022-07-10 VITALS — BP 100/68 | HR 77 | Temp 97.8°F | Resp 18 | Wt 110.6 lb

## 2022-07-10 DIAGNOSIS — Z833 Family history of diabetes mellitus: Secondary | ICD-10-CM | POA: Diagnosis not present

## 2022-07-10 DIAGNOSIS — D735 Infarction of spleen: Secondary | ICD-10-CM | POA: Diagnosis not present

## 2022-07-10 DIAGNOSIS — C3490 Malignant neoplasm of unspecified part of unspecified bronchus or lung: Secondary | ICD-10-CM

## 2022-07-10 DIAGNOSIS — D631 Anemia in chronic kidney disease: Secondary | ICD-10-CM

## 2022-07-10 DIAGNOSIS — I2510 Atherosclerotic heart disease of native coronary artery without angina pectoris: Secondary | ICD-10-CM | POA: Diagnosis not present

## 2022-07-10 DIAGNOSIS — Z89511 Acquired absence of right leg below knee: Secondary | ICD-10-CM | POA: Diagnosis not present

## 2022-07-10 DIAGNOSIS — Z86718 Personal history of other venous thrombosis and embolism: Secondary | ICD-10-CM | POA: Diagnosis not present

## 2022-07-10 DIAGNOSIS — N1831 Chronic kidney disease, stage 3a: Secondary | ICD-10-CM

## 2022-07-10 DIAGNOSIS — Z79899 Other long term (current) drug therapy: Secondary | ICD-10-CM | POA: Diagnosis not present

## 2022-07-10 DIAGNOSIS — Z95828 Presence of other vascular implants and grafts: Secondary | ICD-10-CM

## 2022-07-10 DIAGNOSIS — C3432 Malignant neoplasm of lower lobe, left bronchus or lung: Secondary | ICD-10-CM | POA: Diagnosis not present

## 2022-07-10 DIAGNOSIS — Z8249 Family history of ischemic heart disease and other diseases of the circulatory system: Secondary | ICD-10-CM | POA: Diagnosis not present

## 2022-07-10 DIAGNOSIS — E785 Hyperlipidemia, unspecified: Secondary | ICD-10-CM | POA: Diagnosis not present

## 2022-07-10 DIAGNOSIS — Z888 Allergy status to other drugs, medicaments and biological substances status: Secondary | ICD-10-CM | POA: Diagnosis not present

## 2022-07-10 DIAGNOSIS — Z86711 Personal history of pulmonary embolism: Secondary | ICD-10-CM | POA: Diagnosis not present

## 2022-07-10 DIAGNOSIS — E538 Deficiency of other specified B group vitamins: Secondary | ICD-10-CM | POA: Diagnosis not present

## 2022-07-10 DIAGNOSIS — E876 Hypokalemia: Secondary | ICD-10-CM | POA: Diagnosis not present

## 2022-07-10 DIAGNOSIS — I252 Old myocardial infarction: Secondary | ICD-10-CM | POA: Diagnosis not present

## 2022-07-10 DIAGNOSIS — N1832 Chronic kidney disease, stage 3b: Secondary | ICD-10-CM | POA: Diagnosis not present

## 2022-07-10 DIAGNOSIS — C3492 Malignant neoplasm of unspecified part of left bronchus or lung: Secondary | ICD-10-CM

## 2022-07-10 DIAGNOSIS — I700 Atherosclerosis of aorta: Secondary | ICD-10-CM | POA: Diagnosis not present

## 2022-07-10 DIAGNOSIS — I130 Hypertensive heart and chronic kidney disease with heart failure and stage 1 through stage 4 chronic kidney disease, or unspecified chronic kidney disease: Secondary | ICD-10-CM | POA: Diagnosis not present

## 2022-07-10 DIAGNOSIS — E440 Moderate protein-calorie malnutrition: Secondary | ICD-10-CM | POA: Diagnosis not present

## 2022-07-10 DIAGNOSIS — Z902 Acquired absence of lung [part of]: Secondary | ICD-10-CM | POA: Diagnosis not present

## 2022-07-10 DIAGNOSIS — K5730 Diverticulosis of large intestine without perforation or abscess without bleeding: Secondary | ICD-10-CM | POA: Diagnosis not present

## 2022-07-10 DIAGNOSIS — Z87891 Personal history of nicotine dependence: Secondary | ICD-10-CM | POA: Diagnosis not present

## 2022-07-10 MED ORDER — SODIUM CHLORIDE 0.9 % IV SOLN
Freq: Once | INTRAVENOUS | Status: AC
  Filled 2022-07-10: qty 250

## 2022-07-10 MED ORDER — HEPARIN SOD (PORK) LOCK FLUSH 100 UNIT/ML IV SOLN
500.0000 [IU] | Freq: Once | INTRAVENOUS | Status: AC | PRN
  Administered 2022-07-10: 500 [IU]
  Filled 2022-07-10: qty 5

## 2022-07-10 MED ORDER — SODIUM CHLORIDE 0.9 % IV SOLN
200.0000 mg | Freq: Once | INTRAVENOUS | Status: AC
  Administered 2022-07-10: 200 mg via INTRAVENOUS
  Filled 2022-07-10: qty 200

## 2022-07-10 NOTE — Assessment & Plan Note (Addendum)
Chronic anemia, hemoglobin slightly decreased.  Possible anemia due to CKD.  Hemoglobin slightly improved.  I recommend 1 more dose of IV Venofer.

## 2022-07-10 NOTE — Assessment & Plan Note (Signed)
Recommend IM Vitamin B12 weekly x 4

## 2022-07-10 NOTE — Progress Notes (Signed)
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  Poorly differentiated adenocarcinoma.  Status post left lower lobectomy and lymph node dissection pT1c pN2 [pathology addendum changed to N2]  She has negative surgical margin, no clear benefit of postoperative RT.  S/p 4 cycles of adjuvant chemotherapy Carboplatin and Taxol.  S/p 1 cycle of Tecentriq, Tecentriq was discontinued due to severe immunotherapy induced liver toxicity Labs reviewed and discussed with patient. CT scan shows no recurrence.  Continue surveillance.  Repeat CT in 3 months.  Anemia in chronic kidney disease (CKD) Chronic anemia, hemoglobin slightly decreased.  Possible anemia due to CKD.  Hemoglobin slightly improved.  I recommend 1 more dose of IV Venofer.    Stage 3b chronic kidney disease (HCC) Encourage oral hydration. Avoid nephrotoxins. Creatinine has worsened.  Recommend patient to see nephrologist.   Vitamin B12 deficiency Recommend IM Vitamin B12 weekly x 4  Port-A-Cath in place Continue port flush   Orders Placed This Encounter  Procedures   CT Chest Wo Contrast    Standing Status:   Future    Standing Expiration Date:   07/10/2023    Order Specific Question:   Preferred imaging location?    Answer:   [facility]   CBC with Differential/Platelet    Standing Status:   Future    Standing Expiration Date:   07/11/2023   Comprehensive metabolic panel    Standing Status:   Future    Standing Expiration Date:    07/11/2023      Follow-up 3 months CT chest wo contrast  Prior to MD  All questions were answered. The patient knows to call the clinic with any problems, questions or concerns.  Rickard Patience, MD, PhD [facility] Hematology Oncology 07/10/2022     CHIEF COMPLAINTS/REASON FOR VISIT:  Follow up for stage IIIA lung cancer  HISTORY OF PRESENTING ILLNESS:  Patient presents for follow-up of stage III lung adenocarcinoma. Oncology history listed as below. Oncology History  Non-small cell lung cancer (HCC)  12/30/2020 Initial Diagnosis   Non-small cell lung cancer   -11/17/2020, CT hematuria work-up was obtained which showed no evidence of urinary tract calculus or hydronephrosis.  Small left renal cyst. Incidental findings of 1.9 x 1.2 cm left lower lobe pulmonary nodule worrisome for malignancy. -12/07/2020, PET scan showed left lower lobe 1.3 x 1.8 cm lung nodule with SUV of 6.5.  Suspect early stage primary lung neoplasm.  No hilar or mediastinal lymphadenopathy activity. - 12/23/2020 S/p CT guided biopsy of left lower lobe mass.  Pathology showed non-small cell carcinoma, favor adenocarcinoma.  Positive for TTF-1, negative for p40.     01/28/2021 Surgery   - left lower lobectomy with lymph node dissection. Pathology showed invasive poorly differentiated adenocarcinoma, 2.2 cm.  Tumor abuts pleura but visceral pleural surface is not involved by carcinoma.  LVI invasion is present, resection margins are negative for carcinoma.  10 lymph nodes were harvested and 3 lymph nodes were involved with carcinoma. pT1c pN2 Patient's case was discussed at [facility] oncology tumor  board.  And consensus recommendation was adjuvant chemotherapy  Foundation one testing showed PD-L1 1, No reportable alterations   01/28/2021 Cancer Staging   Staging form: Lung, AJCC 8th Edition - Pathologic stage from 01/28/2021: Stage IIIA (pT1c, pN2, cM0) - Signed by Rickard Patience, MD on 02/17/2021 Stage prefix: Initial  diagnosis Residual tumor (R): R0 - None   02/28/2021 - 05/19/2021 Chemotherapy   Patient is on Treatment Plan : LUNG Carboplatin + Paclitaxel q21d     07/07/2021 - 07/07/2021 Chemotherapy   Atezolizumab q21d x 12 dose. She developed gade 4 liver toxicity. Treatment was discontinued.    12/22/2021 Imaging   CT chest abdomen pelvis wo contrast 1. Similar surgical changes of left lower lobectomy without evidence of recurrent disease. 2. No evidence of metastatic disease in the chest, abdomen or pelvis on this noncontrast enhanced CT. 3. Colonic diverticulosis without findings of acute diverticulitis.4.  Aortic Atherosclerosis (ICD10-I70.0).   12/31/2021 Imaging   MRI brain w wo contrast showed No acute intracranial process. No evidence of metastatic disease in the brain.   03/29/2022 Imaging   CT chest abdomen pelvis w contrast 1. No evidence of recurrent or metastatic disease. 2. New patchy and wispy areas of ground-glass in the right upper and right lower lobes, likely infectious/inflammatory etiology.Correlate with any history of recent COVID infection. 3. Trace left pleural fluid. 4. Splenic infarct. 5. Aortic atherosclerosis (ICD10-I70.0). Coronary artery calcification. 6. Pulmonic trunk is enlarged, indicative of pulmonary arterial hypertension.       INTERVAL HISTORY [name] [name] is a 67 y.o. female who has above history reviewed by me today presents for follow up visit for Stage IIIA lung cancer.  Patient reports feeling well.  She takes nutrition supplementation. Weight is stable No new complaints. She denies any recent upper respiratory symptoms, fever chills, cough, chest pain.    Review of Systems  Constitutional:  Negative for appetite change, chills, fatigue, fever and unexpected weight change.  HENT:   Negative for hearing loss and voice change.   Eyes:  Negative for eye problems.  Respiratory:  Negative for chest tightness and cough.   Cardiovascular:  Negative for  chest pain.  Gastrointestinal:  Negative for abdominal distention, abdominal pain, blood in stool, diarrhea and nausea.  Endocrine: Negative for hot flashes.  Genitourinary:  Negative for difficulty urinating, dysuria and frequency.   Musculoskeletal:  Negative for arthralgias.  Skin:  Negative for itching and rash.  Neurological:  Negative for extremity weakness and light-headedness.  Hematological:  Negative for adenopathy.  Psychiatric/Behavioral:  Negative for confusion.     MEDICAL HISTORY:  Past Medical History:  Diagnosis Date   Anemia    Cancer (HCC)    CHF (congestive heart failure) (HCC)    02/16/20 HFrEF 20-25% in setting of acute DVT/PE, underlying CAD   CKD (chronic kidney disease)    Coronary artery disease    02/20/20: CTO pRCA with left-to-right collaterals, 50% mLAD, 80% OM1, medical therapy   Depression    Diabetes (HCC)    Diabetes mellitus without complication (HCC)    DVT (deep venous thrombosis) (HCC) 02/16/2020   RLE DVT proximal veins 02/16/20 Duplex   Hyperlipidemia    Hypertension    Myocardial infarction [facility]) 2009   Stress related per patient; NSTEMI 2012 100% RCA with left-to-right collaterals   PE (pulmonary thromboembolism) (HCC) 02/15/2020   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, RLE BKA   Peripheral artery disease (HCC)     SURGICAL HISTORY:  Past Surgical History:  Procedure Laterality Date   IR IMAGING GUIDED PORT INSERTION  07/27/2021   Right lower extremity BKA Right     SOCIAL HISTORY: Social History   Socioeconomic History   Marital status: Married    Spouse name: Not on file   Number of children: Not on file   Years of education: Not on file   Highest education level: Not on file  Occupational History   Not on file  Tobacco Use   Smoking status: Former    Packs/day: 0.25    Types: Cigars, Cigarettes    Quit date: 01/17/2021    Years since quitting: 1.4   Smokeless tobacco: Never  Vaping Use   Vaping Use: Never  used  Substance and Sexual Activity   Alcohol use: Never   Drug use: Not Currently   Sexual activity: Not Currently  Other Topics Concern   Not on file  Social History Narrative   Not on file   Social Determinants of Health   Financial Resource Strain: Low Risk  (12/15/2021)   Overall Financial Resource Strain (CARDIA)    Difficulty of Paying Living Expenses: Not very hard  Food Insecurity: No Food Insecurity (12/15/2021)   Hunger Vital Sign    Worried About Running Out of Food in the Last Year: Never true    Ran Out of Food in the Last Year: Never true  Transportation Needs: Unmet Transportation Needs (07/10/2022)   PRAPARE - Transportation    Lack of Transportation (Medical): Yes    Lack of Transportation (Non-Medical): Yes  Physical Activity: Not on file  Stress: Stress Concern Present (12/15/2021)   [employer] of Occupational Health - Occupational Stress Questionnaire    Feeling of Stress : To some extent  Social Connections: Moderately Isolated (12/15/2021)   Social Connection and Isolation Panel [NHANES]    Frequency of Communication with Friends and Family: Twice a week    Frequency of Social Gatherings with Friends and Family: Twice a week    Attends Religious Services: Never    Database administrator or Organizations: No    Attends Engineer, structural: Never    Marital Status: Married  Catering manager Violence: Not on file    FAMILY HISTORY: Family History  Problem Relation Age of Onset   Heart disease Mother    Diabetes Mother    Heart disease Father    Diabetes Father     ALLERGIES:  is allergic to lisinopril.  MEDICATIONS:  Current Outpatient Medications  Medication Sig Dispense Refill   ACCU-CHEK GUIDE test strip USE UP TO 4 TIMES A DAY AS DIRECTED 100 strip 1   Accu-Chek Softclix Lancets lancets SMARTSIG:Topical 1-4 Times Daily     aspirin EC 81 MG tablet Take 81 mg by mouth in the morning. Swallow whole.     blood glucose meter kit  and supplies KIT Dispense based on patient and insurance preference. Use up to four times daily as directed. 1 each 0   carboxymethylcellulose (REFRESH PLUS) 0.5 % SOLN Place 1 drop into both eyes in the morning and at bedtime.     diphenhydrAMINE (BENADRYL) 25 MG tablet Take 25 mg by mouth at bedtime.     docusate sodium (COLACE) 100 MG capsule Take 1 capsule (100 mg total) by mouth 2 (two) times daily. 60 capsule 1   DULoxetine (CYMBALTA) 20 MG capsule SMARTSIG:2 Capsule(s) By Mouth Morning-Night     HUMALOG KWIKPEN 100 UNIT/ML KwikPen Inject 8 Units into the  skin 3 (three) times daily after meals. 15 mL 11   insulin degludec (TRESIBA) 100 UNIT/ML FlexTouch Pen Inject 10 Units into the skin 2 (two) times daily.     isosorbide dinitrate (ISORDIL) 30 MG tablet Take 1 tablet (30 mg total) by mouth 3 (three) times daily. 270 tablet 1   ketoconazole (NIZORAL) 2 % cream APPLY 1 APPLICATION TOPICALLY IN THE MORNING AND AT BEDTIME. APPLIED TO FEET 60 g 1   lidocaine-prilocaine (EMLA) cream Apply 1 application topically as needed. Apply to port and cover with saran wrap 1-2 hours prior to port access 30 g 1   metFORMIN (GLUCOPHAGE-XR) 500 MG 24 hr tablet TAKE 1 TABLET BY MOUTH TWICE A DAY 180 tablet 2   metoprolol succinate (TOPROL-XL) 50 MG 24 hr tablet Take 1 tablet (50 mg total) by mouth daily. Take with or immediately following a meal. 90 tablet 1   Multiple Vitamin (MULTIVITAMIN WITH MINERALS) TABS tablet Take 1 tablet by mouth in the morning. Adults 50+     ondansetron (ZOFRAN) 8 MG tablet TAKE 1 TABLET 2 TIMES DAILY AS NEEDED (NAUSEA/VOMITING). START IF NEEDED ON 3RD DAY AFTER CISPLATIN. 30 tablet 1   pantoprazole (PROTONIX) 40 MG tablet Take 1 tablet (40 mg total) by mouth daily. 30 tablet 2   potassium chloride SA (KLOR-CON M) 20 MEQ tablet Take 1 tablet (20 mEq total) by mouth daily. 14 tablet 0   prochlorperazine (COMPAZINE) 10 MG tablet TAKE 1 TABLET (10 MG TOTAL) BY MOUTH EVERY 6 (SIX) HOURS AS  NEEDED (NAUSEA OR VOMITING). 30 tablet 1   spironolactone (ALDACTONE) 25 MG tablet Take 1 tablet (25 mg total) by mouth daily. 90 tablet 1   gabapentin (NEURONTIN) 100 MG capsule Take 1 capsule (100 mg total) by mouth 2 (two) times daily. (Patient not taking: Reported on 07/10/2022) 60 capsule 5   No current facility-administered medications for this visit.   Facility-Administered Medications Ordered in Other Visits  Medication Dose Route Frequency Provider Last Rate Last Admin   heparin lock flush 100 UNIT/ML injection            heparin lock flush 100 unit/mL  500 Units Intravenous Once Rickard Patience, MD         PHYSICAL EXAMINATION: ECOG PERFORMANCE STATUS: 1 - Symptomatic but completely ambulatory Vitals:   07/10/22 1323  BP: 100/68  Pulse: 77  Resp: 18  Temp: 97.8 F (36.6 C)   Filed Weights   07/10/22 1323  Weight: 110 lb 9.6 oz (50.2 kg)    Physical Exam Constitutional:      General: She is not in acute distress.    Appearance: She is not ill-appearing.  HENT:     Head: Normocephalic and atraumatic.  Eyes:     General: No scleral icterus. Cardiovascular:     Rate and Rhythm: Normal rate and regular rhythm.     Heart sounds: Normal heart sounds.  Pulmonary:     Effort: Pulmonary effort is normal. No respiratory distress.     Breath sounds: No wheezing.     Comments: Absent breath sounds left basilar Abdominal:     General: Bowel sounds are normal. There is no distension.     Palpations: Abdomen is soft.  Musculoskeletal:        General: No deformity. Normal range of motion.     Cervical back: Normal range of motion and neck supple.     Comments: Right lower extremity BKA  Skin:    General: Skin is  warm and dry.     Findings: No erythema or rash.  Neurological:     Mental Status: She is alert and oriented to person, place, and time. Mental status is at baseline.     Cranial Nerves: No cranial nerve deficit.     Coordination: Coordination normal.  Psychiatric:         Mood and Affect: Mood normal.     LABORATORY DATA:  I have reviewed the data as listed     Latest Ref Rng & Units 07/03/2022   12:38 PM 03/28/2022   10:09 AM 01/23/2022   10:08 AM  CBC  WBC 4.0 - 10.5 K/uL 4.9  5.2  4.6   Hemoglobin 12.0 - 15.0 g/dL 53.2  8.7  9.2   Hematocrit 36.0 - 46.0 % 33.3  27.8  29.7   Platelets 150 - 400 K/uL 317  430  394       Latest Ref Rng & Units 07/03/2022   12:38 PM 06/09/2022   12:00 AM 03/28/2022   10:09 AM  CMP  Glucose 70 - 99 mg/dL 92   92   BUN 8 - 23 mg/dL 25   22   Creatinine 0.23 - 1.00 mg/dL 3.43  1.3     5.68   Sodium 135 - 145 mmol/L 139  141     140   Potassium 3.5 - 5.1 mmol/L 4.5  4.1     4.2   Chloride 98 - 111 mmol/L 111   111   CO2 22 - 32 mmol/L 22   26   Calcium 8.9 - 10.3 mg/dL 8.1   8.2   Total Protein 6.5 - 8.1 g/dL 5.4   5.7   Total Bilirubin 0.3 - 1.2 mg/dL <6.1   0.3   Alkaline Phos 38 - 126 U/L 107   108   AST 15 - 41 U/L 19   21   ALT 0 - 44 U/L 12   12      This result is from an external source.        RADIOGRAPHIC STUDIES: I have personally reviewed the radiological images as listed and agreed with the findings in the report. CT Chest Wo Contrast  Result Date: 07/03/2022 CLINICAL DATA:  Non-small cell lung cancer.  * Tracking Code: BO * EXAM: CT CHEST WITHOUT CONTRAST TECHNIQUE: Multidetector CT imaging of the chest was performed following the standard protocol without IV contrast. RADIATION DOSE REDUCTION: This exam was performed according to the departmental dose-optimization program which includes automated exposure control, adjustment of the mA and/or kV according to patient size and/or use of iterative reconstruction technique. COMPARISON:  03/28/2022. FINDINGS: Cardiovascular: Right IJ Port-A-Cath terminates at the SVC RA junction. Atherosclerotic calcification of the aorta, aortic valve and coronary arteries. Enlarged pulmonic trunk and heart. No pericardial effusion. Mediastinum/Nodes: No  pathologically enlarged mediastinal or axillary lymph nodes. Hilar regions are difficult to definitively evaluate without IV contrast. Esophagus is grossly unremarkable. Lungs/Pleura: Cylindrical bronchiectasis. Minimal residual patchy peribronchovascular ground-glass in the right lower lobe, indicative of evolving postinfectious/postinflammatory scarring. Postoperative scarring in the left upper lobe. Left lower lobectomy. Trace basilar left pleural fluid or pleural thickening, unchanged. No new pulmonary nodules. Airway is otherwise unremarkable. Upper Abdomen: Visualized portions of the liver, adrenal glands and right kidney are unremarkable. 8 mm low-attenuation lesion in the left kidney, too small to characterize. Old splenic infarct, as before. Visualized portions of the pancreas, stomach and bowel are grossly unremarkable. Calcified upper abdominal lymph  nodes. Cholecystectomy. Musculoskeletal: Degenerative changes in the spine. IMPRESSION: 1. No evidence of recurrent or metastatic disease. 2. Evolving postinfectious/postinflammatory scarring in the peripheral right lower lobe. 3. Cylindrical bronchiectasis. 4. Aortic atherosclerosis (ICD10-I70.0). Coronary artery calcification. 5. Enlarged pulmonic trunk, indicative of pulmonary arterial hypertension. Electronically Signed   By: Leanna Battles M.D.   On: 07/03/2022 15:12

## 2022-07-10 NOTE — Assessment & Plan Note (Addendum)
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  Status post left lower lobectomy and lymph node dissection pT1c pN2 [pathology addendum changed to N2]  She has negative surgical margin, no clear benefit of postoperative RT.  S/p 4 cycles of adjuvant chemotherapy Carboplatin and Taxol.  S/p 1 cycle of Tecentriq, Tecentriq was discontinued due to severe immunotherapy induced liver toxicity Labs reviewed and discussed with patient. CT scan shows no recurrence.  Continue surveillance.  Repeat CT in 3 months.

## 2022-07-10 NOTE — Progress Notes (Signed)
Pt here for follow up. Pt reports that she was on antibiotics for UTI recently but still has burning with urination

## 2022-07-10 NOTE — Assessment & Plan Note (Addendum)
Encourage oral hydration. Avoid nephrotoxins. Creatinine has worsened.  Recommend patient to see nephrologist.

## 2022-07-10 NOTE — Assessment & Plan Note (Signed)
Continue port flush

## 2022-07-14 MED FILL — Iron Sucrose Inj 20 MG/ML (Fe Equiv): INTRAVENOUS | Qty: 10 | Status: AC

## 2022-07-17 ENCOUNTER — Inpatient Hospital Stay: Payer: Medicaid Other

## 2022-07-19 ENCOUNTER — Inpatient Hospital Stay: Payer: Medicaid Other

## 2022-07-19 DIAGNOSIS — E876 Hypokalemia: Secondary | ICD-10-CM | POA: Diagnosis not present

## 2022-07-19 DIAGNOSIS — Z89511 Acquired absence of right leg below knee: Secondary | ICD-10-CM | POA: Diagnosis not present

## 2022-07-19 DIAGNOSIS — Z902 Acquired absence of lung [part of]: Secondary | ICD-10-CM | POA: Diagnosis not present

## 2022-07-19 DIAGNOSIS — D735 Infarction of spleen: Secondary | ICD-10-CM | POA: Diagnosis not present

## 2022-07-19 DIAGNOSIS — Z79899 Other long term (current) drug therapy: Secondary | ICD-10-CM | POA: Diagnosis not present

## 2022-07-19 DIAGNOSIS — E538 Deficiency of other specified B group vitamins: Secondary | ICD-10-CM | POA: Diagnosis not present

## 2022-07-19 DIAGNOSIS — I2510 Atherosclerotic heart disease of native coronary artery without angina pectoris: Secondary | ICD-10-CM | POA: Diagnosis not present

## 2022-07-19 DIAGNOSIS — E785 Hyperlipidemia, unspecified: Secondary | ICD-10-CM | POA: Diagnosis not present

## 2022-07-19 DIAGNOSIS — C3432 Malignant neoplasm of lower lobe, left bronchus or lung: Secondary | ICD-10-CM | POA: Diagnosis not present

## 2022-07-19 DIAGNOSIS — Z8249 Family history of ischemic heart disease and other diseases of the circulatory system: Secondary | ICD-10-CM | POA: Diagnosis not present

## 2022-07-19 DIAGNOSIS — I700 Atherosclerosis of aorta: Secondary | ICD-10-CM | POA: Diagnosis not present

## 2022-07-19 DIAGNOSIS — Z86711 Personal history of pulmonary embolism: Secondary | ICD-10-CM | POA: Diagnosis not present

## 2022-07-19 DIAGNOSIS — I252 Old myocardial infarction: Secondary | ICD-10-CM | POA: Diagnosis not present

## 2022-07-19 DIAGNOSIS — C3492 Malignant neoplasm of unspecified part of left bronchus or lung: Secondary | ICD-10-CM

## 2022-07-19 DIAGNOSIS — Z87891 Personal history of nicotine dependence: Secondary | ICD-10-CM | POA: Diagnosis not present

## 2022-07-19 DIAGNOSIS — Z86718 Personal history of other venous thrombosis and embolism: Secondary | ICD-10-CM | POA: Diagnosis not present

## 2022-07-19 DIAGNOSIS — N1832 Chronic kidney disease, stage 3b: Secondary | ICD-10-CM | POA: Diagnosis not present

## 2022-07-19 DIAGNOSIS — Z833 Family history of diabetes mellitus: Secondary | ICD-10-CM | POA: Diagnosis not present

## 2022-07-19 DIAGNOSIS — K5730 Diverticulosis of large intestine without perforation or abscess without bleeding: Secondary | ICD-10-CM | POA: Diagnosis not present

## 2022-07-19 DIAGNOSIS — D631 Anemia in chronic kidney disease: Secondary | ICD-10-CM | POA: Diagnosis not present

## 2022-07-19 DIAGNOSIS — E440 Moderate protein-calorie malnutrition: Secondary | ICD-10-CM | POA: Diagnosis not present

## 2022-07-19 DIAGNOSIS — I130 Hypertensive heart and chronic kidney disease with heart failure and stage 1 through stage 4 chronic kidney disease, or unspecified chronic kidney disease: Secondary | ICD-10-CM | POA: Diagnosis not present

## 2022-07-19 DIAGNOSIS — Z888 Allergy status to other drugs, medicaments and biological substances status: Secondary | ICD-10-CM | POA: Diagnosis not present

## 2022-07-19 MED ORDER — CYANOCOBALAMIN 1000 MCG/ML IJ SOLN
1000.0000 ug | Freq: Once | INTRAMUSCULAR | Status: AC
  Administered 2022-07-19: 1000 ug via INTRAMUSCULAR
  Filled 2022-07-19: qty 1

## 2022-07-20 ENCOUNTER — Inpatient Hospital Stay: Payer: Medicaid Other

## 2022-07-24 ENCOUNTER — Inpatient Hospital Stay: Payer: Medicaid Other

## 2022-07-24 ENCOUNTER — Inpatient Hospital Stay: Payer: Medicaid Other | Attending: Oncology

## 2022-07-24 DIAGNOSIS — E538 Deficiency of other specified B group vitamins: Secondary | ICD-10-CM | POA: Insufficient documentation

## 2022-07-24 DIAGNOSIS — Z79899 Other long term (current) drug therapy: Secondary | ICD-10-CM | POA: Diagnosis not present

## 2022-07-24 DIAGNOSIS — C3492 Malignant neoplasm of unspecified part of left bronchus or lung: Secondary | ICD-10-CM

## 2022-07-24 DIAGNOSIS — C3432 Malignant neoplasm of lower lobe, left bronchus or lung: Secondary | ICD-10-CM | POA: Diagnosis not present

## 2022-07-24 MED ORDER — CYANOCOBALAMIN 1000 MCG/ML IJ SOLN
1000.0000 ug | Freq: Once | INTRAMUSCULAR | Status: AC
  Administered 2022-07-24: 1000 ug via INTRAMUSCULAR
  Filled 2022-07-24: qty 1

## 2022-07-31 ENCOUNTER — Inpatient Hospital Stay: Payer: Medicaid Other

## 2022-07-31 DIAGNOSIS — Z79899 Other long term (current) drug therapy: Secondary | ICD-10-CM | POA: Diagnosis not present

## 2022-07-31 DIAGNOSIS — E538 Deficiency of other specified B group vitamins: Secondary | ICD-10-CM | POA: Diagnosis not present

## 2022-07-31 DIAGNOSIS — C3492 Malignant neoplasm of unspecified part of left bronchus or lung: Secondary | ICD-10-CM

## 2022-07-31 DIAGNOSIS — C3432 Malignant neoplasm of lower lobe, left bronchus or lung: Secondary | ICD-10-CM | POA: Diagnosis not present

## 2022-07-31 MED ORDER — CYANOCOBALAMIN 1000 MCG/ML IJ SOLN
1000.0000 ug | Freq: Once | INTRAMUSCULAR | Status: AC
  Administered 2022-07-31: 1000 ug via INTRAMUSCULAR
  Filled 2022-07-31: qty 1

## 2022-08-01 ENCOUNTER — Ambulatory Visit: Payer: Medicaid Other | Admitting: Family Medicine

## 2022-08-07 ENCOUNTER — Inpatient Hospital Stay: Payer: Medicaid Other

## 2022-08-07 DIAGNOSIS — Z79899 Other long term (current) drug therapy: Secondary | ICD-10-CM | POA: Diagnosis not present

## 2022-08-07 DIAGNOSIS — E538 Deficiency of other specified B group vitamins: Secondary | ICD-10-CM | POA: Diagnosis not present

## 2022-08-07 DIAGNOSIS — C3492 Malignant neoplasm of unspecified part of left bronchus or lung: Secondary | ICD-10-CM

## 2022-08-07 DIAGNOSIS — C3432 Malignant neoplasm of lower lobe, left bronchus or lung: Secondary | ICD-10-CM | POA: Diagnosis not present

## 2022-08-07 MED ORDER — CYANOCOBALAMIN 1000 MCG/ML IJ SOLN
1000.0000 ug | Freq: Once | INTRAMUSCULAR | Status: AC
  Administered 2022-08-07: 1000 ug via INTRAMUSCULAR
  Filled 2022-08-07: qty 1

## 2022-08-08 ENCOUNTER — Ambulatory Visit (INDEPENDENT_AMBULATORY_CARE_PROVIDER_SITE_OTHER): Payer: Medicaid Other | Admitting: Family Medicine

## 2022-08-08 ENCOUNTER — Encounter: Payer: Self-pay | Admitting: Family Medicine

## 2022-08-08 VITALS — BP 124/76 | HR 70 | Ht 65.0 in | Wt 111.0 lb

## 2022-08-08 DIAGNOSIS — Z23 Encounter for immunization: Secondary | ICD-10-CM | POA: Diagnosis not present

## 2022-08-08 DIAGNOSIS — I10 Essential (primary) hypertension: Secondary | ICD-10-CM

## 2022-08-08 NOTE — Progress Notes (Signed)
Date:  08/08/2022   Name:  [name] [name]   DOB:  07/12/1955   MRN:  [id number]   Chief Complaint: Hypertension  Hypertension This is a chronic problem. The current episode started more than 1 year ago. The problem has been gradually improving since onset. The problem is controlled. Pertinent negatives include no blurred vision, chest pain, headaches, orthopnea, palpitations, peripheral edema, PND, shortness of breath or sweats. There are no associated agents to hypertension. Risk factors for coronary artery disease include dyslipidemia and diabetes mellitus. Past treatments include beta blockers and diuretics. The current treatment provides moderate improvement. There are no compliance problems.  There is no history of angina, kidney disease, CAD/MI, CVA, heart failure, left ventricular hypertrophy, PVD or retinopathy. There is no history of chronic renal disease, a hypertension causing med or renovascular disease.    Lab Results  Component Value Date   NA 139 07/03/2022   K 4.5 07/03/2022   CO2 22 07/03/2022   GLUCOSE 92 07/03/2022   BUN 25 (H) 07/03/2022   CREATININE 2.42 (H) 07/03/2022   CALCIUM 8.1 (L) 07/03/2022   GFRNONAA 22 (L) 07/03/2022   Lab Results  Component Value Date   CHOL 248 (A) 06/09/2022   HDL 70 06/09/2022   LDLCALC 158 06/09/2022   TRIG 98 06/09/2022   Lab Results  Component Value Date   TSH 2.055 08/17/2021   Lab Results  Component Value Date   HGBA1C 7.4 06/09/2022   Lab Results  Component Value Date   WBC 4.9 07/03/2022   HGB 10.5 (L) 07/03/2022   HCT 33.3 (L) 07/03/2022   MCV 96.8 07/03/2022   PLT 317 07/03/2022   Lab Results  Component Value Date   ALT 12 07/03/2022   AST 19 07/03/2022   ALKPHOS 107 07/03/2022   BILITOT <0.1 (L) 07/03/2022   No results found for: "25OHVITD2", "25OHVITD3", "VD25OH"   Review of Systems  Constitutional: Negative.  Negative for chills, fatigue, fever and unexpected weight change.  HENT:  Negative for  congestion, ear discharge, ear pain, rhinorrhea, sinus pressure, sneezing and sore throat.   Eyes:  Negative for blurred vision.  Respiratory:  Negative for cough, shortness of breath, wheezing and stridor.   Cardiovascular:  Negative for chest pain, palpitations, orthopnea and PND.  Gastrointestinal:  Negative for abdominal pain, blood in stool, constipation, diarrhea and nausea.  Genitourinary:  Negative for dysuria, flank pain, frequency, hematuria, urgency and vaginal discharge.  Musculoskeletal:  Negative for arthralgias, back pain and myalgias.  Skin:  Negative for rash.  Neurological:  Negative for dizziness, weakness and headaches.  Hematological:  Negative for adenopathy. Does not bruise/bleed easily.  Psychiatric/Behavioral:  Negative for dysphoric mood. The patient is not nervous/anxious.     Patient Active Problem List   Diagnosis Date Noted   Vitamin B12 deficiency 07/10/2022   Port-A-Cath in place 07/10/2022   Hypokalemia 12/26/2021   Debility    Liver function test abnormality    Hypovolemic shock ([place]) 08/03/2021   AKI (acute kidney injury) ([place]) 08/03/2021   Transaminitis 08/02/2021   Chemotherapy-induced nausea 07/28/2021   Encounter for antineoplastic immunotherapy 07/07/2021   Macrocytic anemia 07/07/2021   Moderate protein-calorie malnutrition ([place]) 04/12/2021   Stage 3b chronic kidney disease ([place]) 04/12/2021   Anemia in chronic kidney disease (CKD) 04/12/2021   Dyslipidemia 04/05/2021   Encounter for antineoplastic chemotherapy 02/28/2021   Non-small cell cancer of left lung ([place]) 02/17/2021   Tinea pedis of both feet 02/10/2021   Chronic  pain due to neoplasm 02/10/2021   Adenocarcinoma, lung, left ([place]) 01/19/2021   S/P Robotic Assisted Video Thoracoscopy with Left Lower Lobectomy Lung, Intercostal nerve block, lymph node dissection 01/17/2021   Non-small cell lung cancer ([place]) 12/30/2020   Depression 12/09/2020   Hyperlipidemia 12/09/2020   Primary  hypertension 12/09/2020   Heart failure with reduced ejection fraction ([place]) 02/16/2020   Multiple subsegmental pulmonary emboli without acute cor pulmonale ([place]) 02/15/2020   Iron deficiency anemia 12/18/2019   Rectal pain 12/18/2019   Diabetic foot ulcer ([place]) 10/03/2019   Cellulitis 08/17/2019   Cocaine use 08/17/2019   Peripheral vascular disease ([place]) 08/17/2019   Tobacco use disorder 08/17/2019   Tobacco abuse, in remission 08/17/2019   Cervical dysplasia 04/02/2019   CAD (coronary artery disease), native coronary artery 06/06/2010   Type II diabetes mellitus ([place]) 06/06/2010   Acute subendocardial infarction, initial episode of care ([place]) 06/05/2010    Allergies  Allergen Reactions   Lisinopril Swelling and Other (See Comments)    Recurrent AKI and hyperkalemia when initiation attempted.    Past Surgical History:  Procedure Laterality Date   IR IMAGING GUIDED PORT INSERTION  07/27/2021   Right lower extremity BKA Right     Social History   Tobacco Use   Smoking status: Former    Packs/day: 0.25    Types: Cigars, Cigarettes    Quit date: 01/17/2021    Years since quitting: 1.5   Smokeless tobacco: Never  Vaping Use   Vaping Use: Never used  Substance Use Topics   Alcohol use: Never   Drug use: Not Currently     Medication list has been reviewed and updated.  Current Meds  Medication Sig   ACCU-CHEK GUIDE test strip USE UP TO 4 TIMES A DAY AS DIRECTED   Accu-Chek Softclix Lancets lancets SMARTSIG:Topical 1-4 Times Daily   aspirin EC 81 MG tablet Take 81 mg by mouth in the morning. Swallow whole.   blood glucose meter kit and supplies KIT Dispense based on patient and insurance preference. Use up to four times daily as directed.   carboxymethylcellulose (REFRESH PLUS) 0.5 % SOLN Place 1 drop into both eyes in the morning and at bedtime.   diphenhydrAMINE (BENADRYL) 25 MG tablet Take 25 mg by mouth at bedtime.   DULoxetine (CYMBALTA) 20 MG capsule SMARTSIG:2  Capsule(s) By Mouth Morning-Night   HUMALOG KWIKPEN 100 UNIT/ML KwikPen Inject 8 Units into the skin 3 (three) times daily after meals.   insulin degludec (TRESIBA) 100 UNIT/ML FlexTouch Pen Inject 10 Units into the skin 2 (two) times daily.   isosorbide dinitrate (ISORDIL) 30 MG tablet Take 1 tablet (30 mg total) by mouth 3 (three) times daily.   ketoconazole (NIZORAL) 2 % cream APPLY 1 APPLICATION TOPICALLY IN THE MORNING AND AT BEDTIME. APPLIED TO FEET   metFORMIN (GLUCOPHAGE-XR) 500 MG 24 hr tablet TAKE 1 TABLET BY MOUTH TWICE A DAY   metoprolol succinate (TOPROL-XL) 50 MG 24 hr tablet Take 1 tablet (50 mg total) by mouth daily. Take with or immediately following a meal.   Multiple Vitamin (MULTIVITAMIN WITH MINERALS) TABS tablet Take 1 tablet by mouth in the morning. Adults 50+   ondansetron (ZOFRAN) 8 MG tablet TAKE 1 TABLET 2 TIMES DAILY AS NEEDED (NAUSEA/VOMITING). START IF NEEDED ON 3RD DAY AFTER CISPLATIN.   potassium chloride SA (KLOR-CON M) 20 MEQ tablet Take 1 tablet (20 mEq total) by mouth daily.   spironolactone (ALDACTONE) 25 MG tablet Take 1 tablet (25 mg  total) by mouth daily.   [DISCONTINUED] docusate sodium (COLACE) 100 MG capsule Take 1 capsule (100 mg total) by mouth 2 (two) times daily.   [DISCONTINUED] lidocaine-prilocaine (EMLA) cream Apply 1 application topically as needed. Apply to port and cover with saran wrap 1-2 hours prior to port access       08/08/2022    3:04 PM 06/20/2022    1:30 PM 02/10/2021    2:54 PM 08/10/2020    4:22 PM  GAD 7 : Generalized Anxiety Score  Nervous, Anxious, on Edge 0 0 0 0  Control/stop worrying 0 0 0 0  Worry too much - different things 0 0 0 1  Trouble relaxing 0 0 0 0  Restless 0 0 0 0  Easily annoyed or irritable 0 0 1 0  Afraid - awful might happen 0 0 0 0  Total GAD 7 Score 0 0 1 1  Anxiety Difficulty Not difficult at all Not difficult at all Not difficult at all        08/08/2022    3:04 PM 06/20/2022    1:30 PM 12/15/2021     1:58 PM  Depression screen PHQ 2/9  Decreased Interest 0 0 0  Down, Depressed, Hopeless 0 0 0  PHQ - 2 Score 0 0 0  Altered sleeping 0 0   Tired, decreased energy 0 0   Change in appetite 0 0   Feeling bad or failure about yourself  0 0   Trouble concentrating 0 0   Moving slowly or fidgety/restless 0 0   Suicidal thoughts 0 0   PHQ-9 Score 0 0   Difficult doing work/chores Not difficult at all Not difficult at all     BP Readings from Last 3 Encounters:  08/08/22 124/76  07/10/22 104/60  07/10/22 100/68    Physical Exam Vitals and nursing note reviewed. Exam conducted with a chaperone present.  Constitutional:      General: She is not in acute distress.    Appearance: She is not diaphoretic.  HENT:     Head: Normocephalic and atraumatic.     Right Ear: External ear normal.     Left Ear: External ear normal.     Nose: Nose normal.     Mouth/Throat:     Mouth: Mucous membranes are moist.  Eyes:     General:        Right eye: No discharge.        Left eye: No discharge.     Conjunctiva/sclera: Conjunctivae normal.     Pupils: Pupils are equal, round, and reactive to light.  Neck:     Thyroid: No thyromegaly.     Vascular: No JVD.  Cardiovascular:     Rate and Rhythm: Normal rate and regular rhythm.     Heart sounds: Normal heart sounds. No murmur heard.    No friction rub. No gallop.  Pulmonary:     Effort: Pulmonary effort is normal.     Breath sounds: Normal breath sounds. No wheezing, rhonchi or rales.  Abdominal:     General: Bowel sounds are normal.     Palpations: Abdomen is soft. There is no mass.     Tenderness: There is no abdominal tenderness. There is no guarding or rebound.  Musculoskeletal:        General: Normal range of motion.     Cervical back: Normal range of motion and neck supple.  Lymphadenopathy:     Cervical: No cervical adenopathy.  Skin:  General: Skin is warm and dry.  Neurological:     Mental Status: She is alert.     Deep  Tendon Reflexes: Reflexes are normal and symmetric.     Wt Readings from Last 3 Encounters:  08/08/22 111 lb (50.3 kg)  07/10/22 110 lb 9.6 oz (50.2 kg)  06/20/22 112 lb (50.8 kg)    BP 124/76   Pulse 70   Ht 5\' 5"  (1.651 m)   Wt 111 lb (50.3 kg)   SpO2 99%   BMI 18.47 kg/m   Assessment and Plan:  1. Primary hypertension Chronic.  Controlled.  Stable.  Blood pressure today is 124/76.  Asymptomatic.  Tolerating medication well.  Continue metoprolol 50 mg XL once a day and spironolactone 25 mg once a day.  Review of renal function panel that is administered by medication prescribers is acceptable.  Will recheck in 1 year.  2. Need for immunization against influenza Discussed and administered. - Flu Vaccine QUAD 75mo+IM (Fluarix, Fluzone & Alfiuria Quad PF)    Otilio Miu, MD

## 2022-08-10 ENCOUNTER — Other Ambulatory Visit: Payer: Self-pay | Admitting: Family Medicine

## 2022-08-10 MED ORDER — POTASSIUM CHLORIDE CRYS ER 20 MEQ PO TBCR: 20.0000 meq | EXTENDED_RELEASE_TABLET | Freq: Every day | ORAL | 0 refills | Status: DC

## 2022-08-10 NOTE — Telephone Encounter (Signed)
Requested medication (s) are due for refill today: For review  Requested medication (s) are on the active medication list: yes    Last refill: 12/26/21 #14  0 refills  Future visit scheduled no  Notes to clinic:Historical Provider  Requested Prescriptions  Pending Prescriptions Disp Refills   potassium chloride SA (KLOR-CON M) 20 MEQ tablet 14 tablet 0    Sig: Take 1 tablet (20 mEq total) by mouth daily.     Endocrinology:  Minerals - Potassium Supplementation Failed - 08/10/2022  3:17 PM      Failed - Cr in normal range and within 360 days    Creatinine  Date Value Ref Range Status  04/02/2014 0.75 0.60 - 1.30 mg/dL Final   Creatinine, Ser  Date Value Ref Range Status  07/03/2022 2.42 (H) 0.44 - 1.00 mg/dL Final         Passed - K in normal range and within 360 days    Potassium  Date Value Ref Range Status  07/03/2022 4.5 3.5 - 5.1 mmol/L Final  04/02/2014 3.8 3.5 - 5.1 mmol/L Final         Passed - Valid encounter within last 12 months    Recent Outpatient Visits           2 days ago Primary hypertension   [facility] [place], Deanna C, MD   1 month ago Primary hypertension   [facility] [place], Deanna C, MD   11 months ago Primary hypertension   [facility] [place], Deanna C, MD   1 year ago Primary hypertension   [facility] [place], Deanna C, MD   1 year ago Chronic pain due to neoplasm   [facility], [facility]

## 2022-08-10 NOTE — Telephone Encounter (Signed)
Copied from [place] [id number]. Topic: General - Other >> Aug 10, 2022  2:57 PM Everette C wrote: Reason for CRM: Medication Refill - Medication: potassium chloride SA (KLOR-CON M) 20 MEQ tablet [[id number]]  Has the patient contacted their pharmacy? Yes.   (Agent: If no, request that the patient contact the pharmacy for the refill. If patient does not wish to contact the pharmacy document the reason why and proceed with request.) (Agent: If yes, when and what did the pharmacy advise?)  Preferred Pharmacy (with phone number or street name): [id number] - [place], [place] [address] Phone: [phone] Fax: [phone] Hours: Not open 24 hours   Has the patient been seen for an appointment in the last year OR does the patient have an upcoming appointment? Yes.    Agent: Please be advised that RX refills may take up to 3 business days. We ask that you follow-up with your pharmacy.

## 2022-08-14 ENCOUNTER — Other Ambulatory Visit: Payer: Self-pay | Admitting: Family Medicine

## 2022-08-14 ENCOUNTER — Other Ambulatory Visit: Payer: Self-pay | Admitting: Oncology

## 2022-08-14 DIAGNOSIS — B353 Tinea pedis: Secondary | ICD-10-CM

## 2022-08-14 DIAGNOSIS — Z7952 Long term (current) use of systemic steroids: Secondary | ICD-10-CM

## 2022-08-14 DIAGNOSIS — C3492 Malignant neoplasm of unspecified part of left bronchus or lung: Secondary | ICD-10-CM

## 2022-08-14 NOTE — Telephone Encounter (Signed)
Requested medication (s) are due for refill today - expired Rx  Requested medication (s) are on the active medication list -yes  Future visit scheduled -no  Last refill: 07/25/21 60g 1RF  Notes to clinic: non delegated Rx  Requested Prescriptions  Pending Prescriptions Disp Refills   ketoconazole (NIZORAL) 2 % cream [Pharmacy Med Name: KETOCONAZOLE 2% CREAM] 60 g 1    Sig: APPLY TO FEET TOPICALLY IN THE MORNING AND AT BED DAILY     Not Delegated - Over the Counter: OTC 2 Failed - 08/14/2022  3:15 PM      Failed - This refill cannot be delegated      Passed - Valid encounter within last 12 months    Recent Outpatient Visits           6 days ago Primary hypertension   [facility] [place], Deanna C, MD   1 month ago Primary hypertension   [facility] [place], Deanna C, MD   11 months ago Primary hypertension   [facility] [place], Deanna C, MD   1 year ago Primary hypertension   [facility] [place], Deanna C, MD   1 year ago Chronic pain due to neoplasm   [place] [place] [facility] [place], Deanna C, MD                 Requested Prescriptions  Pending Prescriptions Disp Refills   ketoconazole (NIZORAL) 2 % cream [Pharmacy Med Name: KETOCONAZOLE 2% CREAM] 60 g 1    Sig: APPLY TO FEET TOPICALLY IN THE MORNING AND AT BED DAILY     Not Delegated - Over the Counter: OTC 2 Failed - 08/14/2022  3:15 PM      Failed - This refill cannot be delegated      Passed - Valid encounter within last 12 months    Recent Outpatient Visits           6 days ago Primary hypertension   [facility] [place], Deanna C, MD   1 month ago Primary hypertension   [facility] [place], Deanna C, MD   11 months ago Primary hypertension   [facility] [place], Deanna C, MD   1 year ago Primary hypertension   [facility] [employer] [facility], Deanna C, MD   1 year ago Chronic pain due to neoplasm   [facility], [facility]

## 2022-08-16 DIAGNOSIS — I5020 Unspecified systolic (congestive) heart failure: Secondary | ICD-10-CM | POA: Diagnosis not present

## 2022-08-16 DIAGNOSIS — I10 Essential (primary) hypertension: Secondary | ICD-10-CM | POA: Diagnosis not present

## 2022-08-16 DIAGNOSIS — Z794 Long term (current) use of insulin: Secondary | ICD-10-CM | POA: Diagnosis not present

## 2022-08-16 DIAGNOSIS — N182 Chronic kidney disease, stage 2 (mild): Secondary | ICD-10-CM | POA: Diagnosis not present

## 2022-08-16 DIAGNOSIS — N879 Dysplasia of cervix uteri, unspecified: Secondary | ICD-10-CM | POA: Diagnosis not present

## 2022-08-16 DIAGNOSIS — I2510 Atherosclerotic heart disease of native coronary artery without angina pectoris: Secondary | ICD-10-CM | POA: Diagnosis not present

## 2022-08-16 DIAGNOSIS — E1165 Type 2 diabetes mellitus with hyperglycemia: Secondary | ICD-10-CM | POA: Diagnosis not present

## 2022-08-17 DIAGNOSIS — B351 Tinea unguium: Secondary | ICD-10-CM | POA: Diagnosis not present

## 2022-08-17 DIAGNOSIS — Z89511 Acquired absence of right leg below knee: Secondary | ICD-10-CM | POA: Diagnosis not present

## 2022-08-17 DIAGNOSIS — I739 Peripheral vascular disease, unspecified: Secondary | ICD-10-CM | POA: Diagnosis not present

## 2022-08-21 ENCOUNTER — Inpatient Hospital Stay: Payer: Medicaid Other

## 2022-08-21 ENCOUNTER — Inpatient Hospital Stay: Payer: Medicaid Other | Attending: Oncology

## 2022-08-21 DIAGNOSIS — C3432 Malignant neoplasm of lower lobe, left bronchus or lung: Secondary | ICD-10-CM | POA: Insufficient documentation

## 2022-08-21 DIAGNOSIS — Z452 Encounter for adjustment and management of vascular access device: Secondary | ICD-10-CM | POA: Insufficient documentation

## 2022-08-21 DIAGNOSIS — Z95828 Presence of other vascular implants and grafts: Secondary | ICD-10-CM

## 2022-08-21 MED ORDER — HEPARIN SOD (PORK) LOCK FLUSH 100 UNIT/ML IV SOLN
500.0000 [IU] | Freq: Once | INTRAVENOUS | Status: AC
  Administered 2022-08-21: 500 [IU] via INTRAVENOUS
  Filled 2022-08-21: qty 5

## 2022-08-21 MED ORDER — SODIUM CHLORIDE 0.9% FLUSH
10.0000 mL | Freq: Once | INTRAVENOUS | Status: AC
  Administered 2022-08-21: 10 mL via INTRAVENOUS
  Filled 2022-08-21: qty 10

## 2022-09-08 ENCOUNTER — Other Ambulatory Visit: Payer: Self-pay | Admitting: Family Medicine

## 2022-09-11 NOTE — Telephone Encounter (Signed)
Requested medication (s) are due for refill today - yes  Requested medication (s) are on the active medication list -yes  Future visit scheduled -no  Last refill: 08/10/22 #30  Notes to clinic: pharmacy request 90 day supply- sent for provider review- last Rx from provider #30  with no RF included.  Requested Prescriptions  Pending Prescriptions Disp Refills   KLOR-CON M20 20 MEQ tablet [Pharmacy Med Name: KLOR-CON M20 TABLET] 90 tablet 1    Sig: TAKE 1 TABLET BY [place] DAY     Endocrinology:  Minerals - Potassium Supplementation Failed - 09/08/2022 12:32 PM      Failed - Cr in normal range and within 360 days    Creatinine  Date Value Ref Range Status  04/02/2014 0.75 0.60 - 1.30 mg/dL Final   Creatinine, Ser  Date Value Ref Range Status  07/03/2022 2.42 (H) 0.44 - 1.00 mg/dL Final         Passed - K in normal range and within 360 days    Potassium  Date Value Ref Range Status  07/03/2022 4.5 3.5 - 5.1 mmol/L Final  04/02/2014 3.8 3.5 - 5.1 mmol/L Final         Passed - Valid encounter within last 12 months    Recent Outpatient Visits           1 month ago Primary hypertension   [facility] [place], Deanna C, MD   2 months ago Primary hypertension   [facility] [place], Deanna C, MD   1 year ago Primary hypertension   [facility] [place], Deanna C, MD   1 year ago Primary hypertension   [facility] [place], Deanna C, MD   1 year ago Chronic pain due to neoplasm   [facility] [place] [facility] [place], Deanna C, MD                 Requested Prescriptions  Pending Prescriptions Disp Refills   KLOR-CON M20 20 [place] tablet [Pharmacy Med Name: KLOR-CON M20 TABLET] 90 tablet 1    Sig: TAKE 1 TABLET BY [place]     Endocrinology:   Minerals - Potassium Supplementation Failed - 09/08/2022 12:32 PM      Failed - Cr in normal range and within 360 days    Creatinine  Date Value Ref Range Status  04/02/2014 0.75 0.60 - 1.30 mg/dL Final   Creatinine, Ser  Date Value Ref Range Status  07/03/2022 2.42 (H) 0.44 - 1.00 mg/dL Final         Passed - K in normal range and within 360 days    Potassium  Date Value Ref Range Status  07/03/2022 4.5 3.5 - 5.1 mmol/L Final  04/02/2014 3.8 3.5 - 5.1 mmol/L Final         Passed - Valid encounter within last 12 months    Recent Outpatient Visits           1 month ago Primary hypertension   [facility] [place], Deanna C, MD   2 months ago Primary hypertension   [facility] [place], Deanna C, MD   1 year ago Primary hypertension   [facility], MD   1 year ago Primary hypertension   [facility] [place], Deanna C, MD   1 year ago Chronic pain due to neoplasm   [facility], MD

## 2022-10-02 DIAGNOSIS — Z89511 Acquired absence of right leg below knee: Secondary | ICD-10-CM | POA: Diagnosis not present

## 2022-10-05 ENCOUNTER — Inpatient Hospital Stay: Payer: Medicaid Other | Attending: Oncology

## 2022-10-05 ENCOUNTER — Ambulatory Visit
Admission: RE | Admit: 2022-10-05 | Discharge: 2022-10-05 | Disposition: A | Discharge status: Home / Self Care | Payer: Medicaid Other | Source: Ambulatory Visit | Attending: Oncology | Admitting: Oncology

## 2022-10-05 DIAGNOSIS — C3490 Malignant neoplasm of unspecified part of unspecified bronchus or lung: Secondary | ICD-10-CM

## 2022-10-09 ENCOUNTER — Inpatient Hospital Stay: Payer: Medicaid Other

## 2022-10-09 ENCOUNTER — Telehealth: Payer: Self-pay | Admitting: Oncology

## 2022-10-09 MED FILL — Iron Sucrose Inj 20 MG/ML (Fe Equiv): INTRAVENOUS | Qty: 10 | Status: AC

## 2022-10-09 NOTE — Telephone Encounter (Signed)
Pt called and requested to reschedule. Tried to call, she did not answer. Left vm. Letting her know i would cancel the appts and she could call to reschedule

## 2022-10-10 ENCOUNTER — Inpatient Hospital Stay: Payer: Medicaid Other | Admitting: Oncology

## 2022-10-10 ENCOUNTER — Inpatient Hospital Stay: Payer: Medicaid Other

## 2022-10-13 ENCOUNTER — Emergency Department
Admission: EM | Admit: 2022-10-13 | Discharge: 2022-10-14 | Disposition: A | Discharge status: Home / Self Care | Payer: Medicaid Other | Attending: Emergency Medicine | Admitting: Emergency Medicine

## 2022-10-13 ENCOUNTER — Inpatient Hospital Stay: Payer: Medicaid Other

## 2022-10-13 ENCOUNTER — Emergency Department: Payer: Medicaid Other

## 2022-10-13 DIAGNOSIS — E119 Type 2 diabetes mellitus without complications: Secondary | ICD-10-CM | POA: Diagnosis not present

## 2022-10-13 DIAGNOSIS — N3090 Cystitis, unspecified without hematuria: Secondary | ICD-10-CM | POA: Insufficient documentation

## 2022-10-13 DIAGNOSIS — N189 Chronic kidney disease, unspecified: Secondary | ICD-10-CM | POA: Insufficient documentation

## 2022-10-13 DIAGNOSIS — I130 Hypertensive heart and chronic kidney disease with heart failure and stage 1 through stage 4 chronic kidney disease, or unspecified chronic kidney disease: Secondary | ICD-10-CM | POA: Insufficient documentation

## 2022-10-13 DIAGNOSIS — I509 Heart failure, unspecified: Secondary | ICD-10-CM | POA: Diagnosis not present

## 2022-10-13 DIAGNOSIS — Z1152 Encounter for screening for COVID-19: Secondary | ICD-10-CM | POA: Diagnosis not present

## 2022-10-13 DIAGNOSIS — E1122 Type 2 diabetes mellitus with diabetic chronic kidney disease: Secondary | ICD-10-CM | POA: Diagnosis not present

## 2022-10-13 DIAGNOSIS — R4182 Altered mental status, unspecified: Secondary | ICD-10-CM | POA: Diagnosis not present

## 2022-10-13 DIAGNOSIS — E162 Hypoglycemia, unspecified: Secondary | ICD-10-CM

## 2022-10-13 DIAGNOSIS — R531 Weakness: Secondary | ICD-10-CM | POA: Diagnosis present

## 2022-10-13 DIAGNOSIS — E11649 Type 2 diabetes mellitus with hypoglycemia without coma: Secondary | ICD-10-CM | POA: Diagnosis not present

## 2022-10-13 DIAGNOSIS — N3001 Acute cystitis with hematuria: Secondary | ICD-10-CM | POA: Diagnosis not present

## 2022-10-13 LAB — COMPREHENSIVE METABOLIC PANEL
ALT: 17 U/L (ref 0–44)
AST: 30 U/L (ref 15–41)
Albumin: 3 g/dL — ABNORMAL LOW (ref 3.5–5.0)
Alkaline Phosphatase: 122 U/L (ref 38–126)
Anion gap: 11 (ref 5–15)
BUN: 46 mg/dL — ABNORMAL HIGH (ref 8–23)
CO2: 13 mmol/L — ABNORMAL LOW (ref 22–32)
Calcium: 8.6 mg/dL — ABNORMAL LOW (ref 8.9–10.3)
Chloride: 112 mmol/L — ABNORMAL HIGH (ref 98–111)
Creatinine, Ser: 2.07 mg/dL — ABNORMAL HIGH (ref 0.44–1.00)
GFR, Estimated: 26 mL/min — ABNORMAL LOW (ref >60)
Glucose, Bld: 298 mg/dL — ABNORMAL HIGH (ref 70–99)
Potassium: 6 mmol/L — ABNORMAL HIGH (ref 3.5–5.1)
Sodium: 136 mmol/L (ref 135–145)
Total Bilirubin: 0.5 mg/dL (ref 0.3–1.2)
Total Protein: 6.8 g/dL (ref 6.5–8.1)

## 2022-10-13 LAB — CBC
HCT: 47.2 % — ABNORMAL HIGH (ref 36.0–46.0)
Hemoglobin: 13.4 g/dL (ref 12.0–15.0)
MCH: 29.8 pg (ref 26.0–34.0)
MCHC: 28.4 g/dL — ABNORMAL LOW (ref 30.0–36.0)
MCV: 104.9 fL — ABNORMAL HIGH (ref 80.0–100.0)
Platelets: 399 10*3/uL (ref 150–400)
RBC: 4.5 MIL/uL (ref 3.87–5.11)
RDW: 14.7 % (ref 11.5–15.5)
WBC: 6.9 10*3/uL (ref 4.0–10.5)
nRBC: 0 % (ref 0.0–0.2)

## 2022-10-13 LAB — CBG MONITORING, ED: Glucose-Capillary: 211 mg/dL — ABNORMAL HIGH (ref 70–99)

## 2022-10-13 MED ORDER — LACTATED RINGERS IV BOLUS
1000.0000 mL | Freq: Once | INTRAVENOUS | Status: AC
  Administered 2022-10-14: 1000 mL via INTRAVENOUS

## 2022-10-13 MED ORDER — SODIUM ZIRCONIUM CYCLOSILICATE 10 G PO PACK
10.0000 g | PACK | ORAL | Status: AC
  Administered 2022-10-13: 10 g via ORAL
  Filled 2022-10-13: qty 1

## 2022-10-13 NOTE — ED Provider Notes (Signed)
[facility] Provider Note    Event Date/Time   First MD Initiated Contact with Patient 10/13/22 2256     (approximate)   History   Chief Complaint: Hypoglycemia   HPI  [name] [name] is a 67 y.o. female with a history of diabetes, hypertension, CKD, CHF who is brought to the ED due to generalized weakness.  Patient reports that she mixed up her insulins and accidentally took a high dose of the wrong insulin 2 days ago.  Since then she has been very fatigued and unable to get out of bed, not eating.  This morning she was found to have a blood sugar of 40, ate a sandwich and then blood sugar improved to 80s.  She does endorse urinary frequency.  Denies any pain, no shortness of breath.  No vision changes or paresthesia or motor weakness.     Physical Exam   Triage Vital Signs: ED Triage Vitals  Enc Vitals Group     BP 10/13/22 2015 134/76     Pulse Rate 10/13/22 2015 93     Resp 10/13/22 2015 17     Temp 10/13/22 2015 97.9 F (36.6 C)     Temp Source 10/13/22 2015 Oral     SpO2 10/13/22 2015 100 %     Weight 10/13/22 2016 145 lb (65.8 kg)     Height --      Head Circumference --      Peak Flow --      Pain Score 10/13/22 2015 0     Pain Loc --      Pain Edu? --      Excl. in GC? --     Most recent vital signs: Vitals:   10/13/22 2015 10/14/22 0246  BP: 134/76 122/68  Pulse: 93 84  Resp: 17 18  Temp: 97.9 F (36.6 C) 98 F (36.7 C)  SpO2: 100% 100%    General: Awake, no distress.  CV:  Good peripheral perfusion.  Regular rate rhythm Resp:  Normal effort.  Clear to auscultation bilaterally Abd:  No distention.  Soft with mild suprapubic tenderness Other:  Dry mucous membranes.  No evidence of head trauma, no midline spinal tenderness. Cranial nerves III through XII intact.  No drift.  Normal finger-nose testing. Left BKA.   ED Results / Procedures / Treatments   Labs (all labs ordered are listed, but only abnormal results are  displayed) Labs Reviewed  CBC - Abnormal; Notable for the following components:      Result Value   HCT 47.2 (*)    MCV 104.9 (*)    MCHC 28.4 (*)    All other components within normal limits  COMPREHENSIVE METABOLIC PANEL - Abnormal; Notable for the following components:   Potassium 6.0 (*)    Chloride 112 (*)    CO2 13 (*)    Glucose, Bld 298 (*)    BUN 46 (*)    Creatinine, Ser 2.07 (*)    Calcium 8.6 (*)    Albumin 3.0 (*)    GFR, Estimated 26 (*)    All other components within normal limits  URINALYSIS, W/ REFLEX TO CULTURE (INFECTION SUSPECTED) - Abnormal; Notable for the following components:   Color, Urine YELLOW (*)    APPearance CLOUDY (*)    Glucose, UA >=500 (*)    Ketones, ur 5 (*)    Protein, ur >=300 (*)    Leukocytes,Ua LARGE (*)    Bacteria, UA MANY (*)  All other components within normal limits  CBG MONITORING, ED - Abnormal; Notable for the following components:   Glucose-Capillary 211 (*)    All other components within normal limits  SARS CORONAVIRUS 2 BY RT PCR  URINE CULTURE     EKG    RADIOLOGY CT head interpreted by me, negative for fracture or intracranial hemorrhage.  Radiology report reviewed   PROCEDURES:  Procedures   MEDICATIONS ORDERED IN ED: Medications  heparin lock flush 100 unit/mL (has no administration in time range)  heparin lock flush 100 UNIT/ML injection (has no administration in time range)  heparin flush 10 UNIT/ML injection 10 Units (has no administration in time range)  lactated ringers bolus 1,000 mL (0 mLs Intravenous Stopped 10/14/22 0210)  sodium zirconium cyclosilicate (LOKELMA) packet 10 g (10 g Oral Given 10/13/22 2354)  cephALEXin (KEFLEX) capsule 500 mg (500 mg Oral Given 10/14/22 0212)     IMPRESSION / MDM / ASSESSMENT AND PLAN / ED COURSE  I reviewed the triage vital signs and the nursing notes.  DDx: Intracranial hemorrhage, AKI, electrolyte abnormality, dehydration, UTI, anemia  Patient's  presentation is most consistent with acute presentation with potential threat to life or bodily function.  Patient presents with generalized weakness after accidental overuse of insulin causing hypoglycemia at home.  After eating, blood sugar is normalized and she is feeling much better.  Neurologic exam is nonfocal in the ED, after fluids she is ambulatory as well.  Workup reveals stable CKD, evidence of UTI.  Will start her on Keflex.  She wishes to be discharged home.   ----------------------------------------- 2:38 AM on 10/14/2022 ----------------------------------------- Urinalysis shows a UTI.  Will treat for cystitis.  No evidence of pyelonephritis or sepsis or urinary retention.  Rest of the workup is unremarkable.  Doubt ACS, stroke, psychiatric cause, or persistent hyperglycemia.  She has remained stable and comfortable here.      FINAL CLINICAL IMPRESSION(S) / ED DIAGNOSES   Final diagnoses:  Cystitis  Hypoglycemia     Rx / DC Orders   ED Discharge Orders          Ordered    cephALEXin (KEFLEX) 500 MG capsule  3 times daily        10/14/22 0212             Note:  This document was prepared using Dragon voice recognition software and may include unintentional dictation errors.   Sharman Cheek, MD 10/14/22 [phone]

## 2022-10-13 NOTE — ED Notes (Signed)
Pt still currently slurring her words. RN called Dr. Sidney Ace to come and eval pt in triage.

## 2022-10-13 NOTE — ED Triage Notes (Addendum)
Pt brought in by EMS. Per EMS they found pt to have a BGL in the low 40's with slurring of words. They encouraged pt to eat a peanut butter sandwhich and gave oral glucose also. Pt than drank some juice and EMS reevaluated her BGL which was in the 80's. PT is a diabetic and she sts that when she takes her insulin her sugar will drop. BGL in triage is 211. Per pt family, pt has been in the bed since Tuesday of this week and than this evening pt started to slur her words and not make any sence.

## 2022-10-13 NOTE — ED Notes (Signed)
Pt and family now reporting that pt is realizing that she took her fast acting insulin tonight by mistake rather than her normal long acting insulin.

## 2022-10-14 LAB — URINALYSIS, W/ REFLEX TO CULTURE (INFECTION SUSPECTED)
Bilirubin Urine: NEGATIVE
Glucose, UA: >=500 mg/dL — AB
Hgb urine dipstick: NEGATIVE
Ketones, ur: 5 mg/dL — AB
Nitrite: NEGATIVE
Protein, ur: >=300 mg/dL — AB
Specific Gravity, Urine: 1.012 (ref 1.005–1.030)
WBC, UA: >50 WBC/hpf (ref 0–5)
pH: 5 (ref 5.0–8.0)

## 2022-10-14 LAB — SARS CORONAVIRUS 2 BY RT PCR: SARS Coronavirus 2 by RT PCR: NEGATIVE

## 2022-10-14 MED ORDER — CEPHALEXIN 500 MG PO CAPS
500.0000 mg | ORAL_CAPSULE | Freq: Once | ORAL | Status: AC
  Administered 2022-10-14: 500 mg via ORAL
  Filled 2022-10-14: qty 1

## 2022-10-14 MED ORDER — HEPARIN SOD (PORK) LOCK FLUSH 10 UNIT/ML IV SOLN
10.0000 [IU] | Freq: Once | INTRAVENOUS | Status: DC
  Filled 2022-10-14: qty 5

## 2022-10-14 MED ORDER — CEPHALEXIN 500 MG PO CAPS: 500.0000 mg | ORAL_CAPSULE | Freq: Three times a day (TID) | ORAL | 0 refills | Status: DC

## 2022-10-14 MED ORDER — HEPARIN SOD (PORK) LOCK FLUSH 100 UNIT/ML IV SOLN
500.0000 [IU] | Freq: Once | INTRAVENOUS | Status: DC
  Filled 2022-10-14: qty 5

## 2022-10-14 MED ORDER — HEPARIN SOD (PORK) LOCK FLUSH 100 UNIT/ML IV SOLN
INTRAVENOUS | Status: AC
  Filled 2022-10-14: qty 5

## 2022-10-14 NOTE — Discharge Instructions (Signed)
Your lab tests show a bladder infection. Take Keflex as prescribed to treat this, and follow up with your doctor this week. Continue to monitor your blood sugar throughout the day and be sure to eat and drink fluids regularly.

## 2022-10-15 LAB — URINE CULTURE

## 2022-10-16 ENCOUNTER — Telehealth: Payer: Self-pay

## 2022-10-16 NOTE — Transitions of Care (Post Inpatient/ED Visit) (Signed)
   10/16/2022  Name: RIKITA GRABERT MRN: [id number] DOB: Jul 09, 1955  Today's TOC FU Call Status: Today's TOC FU Call Status:: Unsuccessful Call (2nd Attempt) Unsuccessful Call (2nd Attempt) Date: 10/16/22  Attempted to reach the patient regarding the most recent Inpatient/ED visit.  Follow Up Plan: Additional outreach attempts will be made to reach the patient to complete the Transitions of Care (Post Inpatient/ED visit) call.   Signature Arthur Holms

## 2022-10-16 NOTE — Transitions of Care (Post Inpatient/ED Visit) (Signed)
   10/16/2022  Name: [name] [name] MRN: [id number] DOB: 08-Dec-1955  Today's TOC FU Call Status: Today's TOC FU Call Status:: Unsuccessul Call (1st Attempt) Unsuccessful Call (1st Attempt) Date: 10/16/22  Attempted to reach the patient regarding the most recent Inpatient/ED visit.  Follow Up Plan: Additional outreach attempts will be made to reach the patient to complete the Transitions of Care (Post Inpatient/ED visit) call.   Signature Arthur Holms

## 2022-10-17 ENCOUNTER — Telehealth: Payer: Self-pay

## 2022-10-17 NOTE — Transitions of Care (Post Inpatient/ED Visit) (Signed)
   10/17/2022  Name: [name] [name] MRN: [id number] DOB: Aug 19, 1955  Today's TOC FU Call Status: Today's TOC FU Call Status:: Unsuccessful Call (3rd Attempt) Unsuccessful Call (3rd Attempt) Date: 10/17/22  Attempted to reach the patient regarding the most recent Inpatient/ED visit.  Follow Up Plan: No further outreach attempts will be made at this time. We have been unable to contact the patient.  Signature Arthur Holms

## 2022-10-17 NOTE — Transitions of Care (Post Inpatient/ED Visit) (Signed)
   10/17/2022  Name: SHANTERICA BIEHLER MRN: [id number] DOB: 10/07/1955  Today's TOC FU Call Status: Today's TOC FU Call Status:: Successful TOC FU Call Competed TOC FU Call Complete Date: 10/17/22  Transition Care Management Follow-up Telephone Call Date of Discharge: 10/13/22 Discharge Facility: [facility], [facility], [facility]) Type of Discharge: Emergency Department Reason for ED Visit: Endocrine (hypoglycemia- took wrong insulin) How have you been since you were released from the hospital?: Better Any questions or concerns?: No  Items Reviewed: Did you receive and understand the discharge instructions provided?: Yes Medications obtained and verified?: Yes (Medications Reviewed) Any new allergies since your discharge?: No Dietary orders reviewed?: NA Do you have support at home?: Yes People in Home: other relative(s) Name of Support/Comfort Primary Source: dorothy [facility] and Equipment/Supplies: Were Home Health Services Ordered?: NA Any new equipment or medical supplies ordered?: NA  Functional Questionnaire: Do you need assistance with bathing/showering or dressing?: No Do you need assistance with meal preparation?: No Do you need assistance with eating?: No Do you have difficulty maintaining continence: No Do you need assistance with getting out of bed/getting out of a chair/moving?: No Do you have difficulty managing or taking your medications?: No  Follow up appointments reviewed: PCP Follow-up appointment confirmed?: NA Specialist Hospital Follow-up appointment confirmed?: Yes Date of Specialist follow-up appointment?: 01/23/23 Follow-Up Specialty Provider:: [facility] end[facility] Do you need transportation to your follow-up appointment?: Yes Transportation Need Intervention Addressed By:: Other: (pt [place] transportation) Do you understand care options if your condition(s) worsen?: Yes-patient verbalized understanding    SIGNATURE Arthur Holms

## 2022-10-19 ENCOUNTER — Inpatient Hospital Stay: Payer: Medicaid Other

## 2022-10-19 ENCOUNTER — Inpatient Hospital Stay: Payer: Medicaid Other | Attending: Oncology

## 2022-10-19 DIAGNOSIS — Z95828 Presence of other vascular implants and grafts: Secondary | ICD-10-CM

## 2022-10-19 DIAGNOSIS — E538 Deficiency of other specified B group vitamins: Secondary | ICD-10-CM | POA: Insufficient documentation

## 2022-10-19 DIAGNOSIS — Z87891 Personal history of nicotine dependence: Secondary | ICD-10-CM | POA: Diagnosis not present

## 2022-10-19 DIAGNOSIS — Z8249 Family history of ischemic heart disease and other diseases of the circulatory system: Secondary | ICD-10-CM | POA: Insufficient documentation

## 2022-10-19 DIAGNOSIS — I252 Old myocardial infarction: Secondary | ICD-10-CM | POA: Insufficient documentation

## 2022-10-19 DIAGNOSIS — Z79899 Other long term (current) drug therapy: Secondary | ICD-10-CM | POA: Insufficient documentation

## 2022-10-19 DIAGNOSIS — Z888 Allergy status to other drugs, medicaments and biological substances status: Secondary | ICD-10-CM | POA: Diagnosis not present

## 2022-10-19 DIAGNOSIS — Z86711 Personal history of pulmonary embolism: Secondary | ICD-10-CM | POA: Diagnosis not present

## 2022-10-19 DIAGNOSIS — N1832 Chronic kidney disease, stage 3b: Secondary | ICD-10-CM | POA: Diagnosis not present

## 2022-10-19 DIAGNOSIS — D631 Anemia in chronic kidney disease: Secondary | ICD-10-CM | POA: Diagnosis not present

## 2022-10-19 DIAGNOSIS — C3432 Malignant neoplasm of lower lobe, left bronchus or lung: Secondary | ICD-10-CM | POA: Insufficient documentation

## 2022-10-19 DIAGNOSIS — I129 Hypertensive chronic kidney disease with stage 1 through stage 4 chronic kidney disease, or unspecified chronic kidney disease: Secondary | ICD-10-CM | POA: Insufficient documentation

## 2022-10-19 DIAGNOSIS — Z86718 Personal history of other venous thrombosis and embolism: Secondary | ICD-10-CM | POA: Diagnosis not present

## 2022-10-19 DIAGNOSIS — Z833 Family history of diabetes mellitus: Secondary | ICD-10-CM | POA: Diagnosis not present

## 2022-10-19 DIAGNOSIS — C3490 Malignant neoplasm of unspecified part of unspecified bronchus or lung: Secondary | ICD-10-CM

## 2022-10-19 DIAGNOSIS — Z89511 Acquired absence of right leg below knee: Secondary | ICD-10-CM | POA: Diagnosis not present

## 2022-10-19 DIAGNOSIS — I700 Atherosclerosis of aorta: Secondary | ICD-10-CM | POA: Diagnosis not present

## 2022-10-19 DIAGNOSIS — Z902 Acquired absence of lung [part of]: Secondary | ICD-10-CM | POA: Diagnosis not present

## 2022-10-19 LAB — CBC WITH DIFFERENTIAL/PLATELET
Abs Immature Granulocytes: 0.03 10*3/uL (ref 0.00–0.07)
Basophils Absolute: 0 10*3/uL (ref 0.0–0.1)
Basophils Relative: 1 %
Eosinophils Absolute: 0.3 10*3/uL (ref 0.0–0.5)
Eosinophils Relative: 7 %
HCT: 33.5 % — ABNORMAL LOW (ref 36.0–46.0)
Hemoglobin: 10.3 g/dL — ABNORMAL LOW (ref 12.0–15.0)
Immature Granulocytes: 1 %
Lymphocytes Relative: 28 %
Lymphs Abs: 1.3 10*3/uL (ref 0.7–4.0)
MCH: 29.3 pg (ref 26.0–34.0)
MCHC: 30.7 g/dL (ref 30.0–36.0)
MCV: 95.2 fL (ref 80.0–100.0)
Monocytes Absolute: 0.3 10*3/uL (ref 0.1–1.0)
Monocytes Relative: 7 %
Neutro Abs: 2.8 10*3/uL (ref 1.7–7.7)
Neutrophils Relative %: 56 %
Platelets: 367 10*3/uL (ref 150–400)
RBC: 3.52 MIL/uL — ABNORMAL LOW (ref 3.87–5.11)
RDW: 14.4 % (ref 11.5–15.5)
WBC: 4.8 10*3/uL (ref 4.0–10.5)
nRBC: 0 % (ref 0.0–0.2)

## 2022-10-19 LAB — RETIC PANEL
Immature Retic Fract: 8.2 % (ref 2.3–15.9)
RBC.: 3.47 MIL/uL — ABNORMAL LOW (ref 3.87–5.11)
Retic Count, Absolute: 47.9 10*3/uL (ref 19.0–186.0)
Retic Ct Pct: 1.4 % (ref 0.4–3.1)
Reticulocyte Hemoglobin: 31.3 pg (ref >27.9)

## 2022-10-19 LAB — IRON AND TIBC
Iron: 57 ug/dL (ref 28–170)
Saturation Ratios: 25 % (ref 10.4–31.8)
TIBC: 232 ug/dL — ABNORMAL LOW (ref 250–450)
UIBC: 175 ug/dL

## 2022-10-19 LAB — FERRITIN: Ferritin: 417 ng/mL — ABNORMAL HIGH (ref 11–307)

## 2022-10-19 MED ORDER — SODIUM CHLORIDE 0.9% FLUSH
10.0000 mL | Freq: Once | INTRAVENOUS | Status: AC
  Administered 2022-10-19: 10 mL via INTRAVENOUS
  Filled 2022-10-19: qty 10

## 2022-10-19 MED ORDER — HEPARIN SOD (PORK) LOCK FLUSH 100 UNIT/ML IV SOLN
500.0000 [IU] | Freq: Once | INTRAVENOUS | Status: AC
  Administered 2022-10-19: 500 [IU] via INTRAVENOUS
  Filled 2022-10-19: qty 5

## 2022-10-20 LAB — VITAMIN B12: Vitamin B-12: 318 pg/mL (ref 180–914)

## 2022-10-23 MED FILL — Iron Sucrose Inj 20 MG/ML (Fe Equiv): INTRAVENOUS | Qty: 10 | Status: AC

## 2022-10-24 ENCOUNTER — Encounter: Payer: Self-pay | Admitting: Oncology

## 2022-10-24 ENCOUNTER — Inpatient Hospital Stay: Payer: Medicaid Other

## 2022-10-24 ENCOUNTER — Inpatient Hospital Stay (HOSPITAL_BASED_OUTPATIENT_CLINIC_OR_DEPARTMENT_OTHER): Payer: Medicaid Other | Admitting: Oncology

## 2022-10-24 VITALS — BP 113/63 | HR 63 | Temp 96.0°F | Resp 18 | Wt 108.4 lb

## 2022-10-24 DIAGNOSIS — E538 Deficiency of other specified B group vitamins: Secondary | ICD-10-CM

## 2022-10-24 DIAGNOSIS — C3492 Malignant neoplasm of unspecified part of left bronchus or lung: Secondary | ICD-10-CM

## 2022-10-24 DIAGNOSIS — I129 Hypertensive chronic kidney disease with stage 1 through stage 4 chronic kidney disease, or unspecified chronic kidney disease: Secondary | ICD-10-CM | POA: Diagnosis not present

## 2022-10-24 DIAGNOSIS — C3432 Malignant neoplasm of lower lobe, left bronchus or lung: Secondary | ICD-10-CM | POA: Diagnosis not present

## 2022-10-24 DIAGNOSIS — C3490 Malignant neoplasm of unspecified part of unspecified bronchus or lung: Secondary | ICD-10-CM

## 2022-10-24 DIAGNOSIS — Z79899 Other long term (current) drug therapy: Secondary | ICD-10-CM | POA: Diagnosis not present

## 2022-10-24 DIAGNOSIS — Z87891 Personal history of nicotine dependence: Secondary | ICD-10-CM | POA: Diagnosis not present

## 2022-10-24 DIAGNOSIS — Z95828 Presence of other vascular implants and grafts: Secondary | ICD-10-CM | POA: Diagnosis not present

## 2022-10-24 DIAGNOSIS — Z902 Acquired absence of lung [part of]: Secondary | ICD-10-CM | POA: Diagnosis not present

## 2022-10-24 DIAGNOSIS — N1832 Chronic kidney disease, stage 3b: Secondary | ICD-10-CM

## 2022-10-24 DIAGNOSIS — D631 Anemia in chronic kidney disease: Secondary | ICD-10-CM | POA: Diagnosis not present

## 2022-10-24 DIAGNOSIS — Z833 Family history of diabetes mellitus: Secondary | ICD-10-CM | POA: Diagnosis not present

## 2022-10-24 DIAGNOSIS — Z86711 Personal history of pulmonary embolism: Secondary | ICD-10-CM | POA: Diagnosis not present

## 2022-10-24 DIAGNOSIS — Z89511 Acquired absence of right leg below knee: Secondary | ICD-10-CM | POA: Diagnosis not present

## 2022-10-24 DIAGNOSIS — I700 Atherosclerosis of aorta: Secondary | ICD-10-CM | POA: Diagnosis not present

## 2022-10-24 DIAGNOSIS — Z888 Allergy status to other drugs, medicaments and biological substances status: Secondary | ICD-10-CM | POA: Diagnosis not present

## 2022-10-24 DIAGNOSIS — I252 Old myocardial infarction: Secondary | ICD-10-CM | POA: Diagnosis not present

## 2022-10-24 DIAGNOSIS — Z8249 Family history of ischemic heart disease and other diseases of the circulatory system: Secondary | ICD-10-CM | POA: Diagnosis not present

## 2022-10-24 DIAGNOSIS — Z86718 Personal history of other venous thrombosis and embolism: Secondary | ICD-10-CM | POA: Diagnosis not present

## 2022-10-24 MED ORDER — CYANOCOBALAMIN 1000 MCG/ML IJ SOLN
1000.0000 ug | Freq: Once | INTRAMUSCULAR | Status: AC
  Administered 2022-10-24: 1000 ug via INTRAMUSCULAR
  Filled 2022-10-24: qty 1

## 2022-10-24 NOTE — Assessment & Plan Note (Addendum)
Continue port flush Q8 weeks 

## 2022-10-24 NOTE — Assessment & Plan Note (Addendum)
Encourage oral hydration. Avoid nephrotoxins. Creatinine has worsened.  Follow up with nephrologist.

## 2022-10-24 NOTE — Assessment & Plan Note (Addendum)
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  Status post left lower lobectomy and lymph node dissection pT1c pN2 [pathology addendum changed to N2]  She has negative surgical margin, no clear benefit of postoperative RT.  S/p 4 cycles of adjuvant chemotherapy Carboplatin and Taxol.  S/p 1 cycle of Tecentriq, Tecentriq was discontinued due to severe immunotherapy induced liver toxicity Labs reviewed and discussed with patient. CT scan shows no recurrence.  Continue surveillance.  Repeat CT in 6 months.

## 2022-10-24 NOTE — Assessment & Plan Note (Addendum)
Continue B12 injection monthly 

## 2022-10-24 NOTE — Progress Notes (Signed)
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  Status post left lower lobectomy and lymph node dissection pT1c pN2 [pathology addendum changed to N2]  She has negative surgical margin, no clear benefit of postoperative RT.  S/p 4 cycles of adjuvant chemotherapy Carboplatin and Taxol.  S/p 1 cycle of Tecentriq, Tecentriq was discontinued due to severe immunotherapy induced liver toxicity Labs reviewed and discussed with patient. CT scan shows no recurrence.  Continue surveillance.  Repeat CT in 6 months.  Stage 3b chronic kidney disease (HCC) Encourage oral hydration. Avoid nephrotoxins. Creatinine has worsened.  Follow up with nephrologist.   Anemia in chronic kidney disease (CKD) Chronic anemia, hemoglobin slightly decreased.  Possible anemia due to CKD.  Hemoglobin slightly improved.  I recommend 1 more dose of IV Venofer.    Vitamin B12 deficiency Continue B12 injection monthly  Port-A-Cath in place Continue port flush Q8 weeks   Orders Placed This Encounter  Procedures   CT Chest Wo Contrast    Standing Status:   Future    Standing Expiration Date:   10/24/2023    Order Specific Question:   Preferred imaging location?    Answer:   [facility]   CBC with Differential (Cancer Center Only)    Standing Status:   Future    Standing Expiration Date:   10/24/2023   CMP (Cancer Center only)    Standing Status:   Future    Standing Expiration Date:   10/24/2023   Iron and TIBC    Standing Status:   Future    Standing  Expiration Date:   10/24/2023   Ferritin    Standing Status:   Future    Standing Expiration Date:   10/24/2023   Vitamin B12    Standing Status:   Future    Standing Expiration Date:   10/24/2023   Folate    Standing Status:   Future    Standing Expiration Date:   10/24/2023      Follow-up 6 months CT chest wo contrast  Prior to lab MD  All questions were answered. The patient knows to call the clinic with any problems, questions or concerns.  Rickard Patience, MD, PhD [facility] Hematology Oncology 10/24/2022    HISTORY OF PRESENTING ILLNESS:  Patient presents for follow-up of stage III lung adenocarcinoma. Oncology history listed as below. Oncology History  Non-small cell lung cancer (HCC)  12/30/2020 Initial Diagnosis   Non-small cell lung cancer   -11/17/2020, CT hematuria work-up was obtained which showed no evidence of urinary tract calculus or hydronephrosis.  Small left renal cyst. Incidental findings of 1.9 x 1.2 cm left lower lobe pulmonary nodule worrisome for malignancy. -12/07/2020, PET scan showed left lower lobe 1.3 x 1.8 cm lung nodule with SUV of 6.5.  Suspect early stage primary lung neoplasm.  No hilar or mediastinal lymphadenopathy activity. - 12/23/2020 S/p CT guided biopsy of left lower lobe mass.  Pathology showed non-small cell carcinoma, favor adenocarcinoma.  Positive for TTF-1, negative for p40.     01/28/2021 Surgery   -  left lower lobectomy with lymph node dissection. Pathology showed invasive poorly differentiated adenocarcinoma, 2.2 cm.  Tumor abuts pleura but visceral pleural surface is not involved by carcinoma.  LVI invasion is present, resection margins are negative for carcinoma.  10 lymph nodes were harvested and 3 lymph nodes were involved with carcinoma. pT1c pN2 Patient's case was discussed at [facility] oncology tumor board.  And consensus recommendation was adjuvant chemotherapy  Foundation one testing showed PD-L1 1, No reportable alterations    01/28/2021 Cancer Staging   Staging form: Lung, AJCC 8th Edition - Pathologic stage from 01/28/2021: Stage IIIA (pT1c, pN2, cM0) - Signed by Rickard Patience, MD on 02/17/2021 Stage prefix: Initial diagnosis Residual tumor (R): R0 - None   02/28/2021 - 05/19/2021 Chemotherapy   Patient is on Treatment Plan : LUNG Carboplatin + Paclitaxel q21d     07/07/2021 - 07/07/2021 Chemotherapy   Atezolizumab q21d x 1 dose. She developed gade 4 liver toxicity. Treatment was discontinued.    12/22/2021 Imaging   CT chest abdomen pelvis wo contrast 1. Similar surgical changes of left lower lobectomy without evidence of recurrent disease. 2. No evidence of metastatic disease in the chest, abdomen or pelvis on this noncontrast enhanced CT. 3. Colonic diverticulosis without findings of acute diverticulitis.4.  Aortic Atherosclerosis (ICD10-I70.0).   12/31/2021 Imaging   MRI brain w wo contrast showed No acute intracranial process. No evidence of metastatic disease in the brain.   03/29/2022 Imaging   CT chest abdomen pelvis w contrast 1. No evidence of recurrent or metastatic disease. 2. New patchy and wispy areas of ground-glass in the right upper and right lower lobes, likely infectious/inflammatory etiology.Correlate with any history of recent COVID infection. 3. Trace left pleural fluid. 4. Splenic infarct. 5. Aortic atherosclerosis (ICD10-I70.0). Coronary artery calcification. 6. Pulmonic trunk is enlarged, indicative of pulmonary arterial hypertension.   10/08/2022 Imaging   CT chest wo contrast  Status post left lower lobectomy.No evidence of recurrent or metastatic disease. Aortic Atherosclerosis        INTERVAL HISTORY [name] [name] is a 67 y.o. female who has above history reviewed by me today presents for follow up visit for Stage IIIA lung cancer.  Patient reports feeling well.  She takes nutrition supplementation. Weight is stable No new complaints. She denies any recent upper respiratory  symptoms, fever chills, cough, chest pain.    Review of Systems  Constitutional:  Negative for appetite change, chills, fatigue, fever and unexpected weight change.  HENT:   Negative for hearing loss and voice change.   Eyes:  Negative for eye problems.  Respiratory:  Negative for chest tightness and cough.   Cardiovascular:  Negative for chest pain.  Gastrointestinal:  Negative for abdominal distention, abdominal pain, blood in stool, diarrhea and nausea.  Endocrine: Negative for hot flashes.  Genitourinary:  Negative for difficulty urinating, dysuria and frequency.   Musculoskeletal:  Negative for arthralgias.  Skin:  Negative for itching and rash.  Neurological:  Negative for extremity weakness and light-headedness.  Hematological:  Negative for adenopathy.  Psychiatric/Behavioral:  Negative for confusion.     MEDICAL HISTORY:  Past Medical History:  Diagnosis Date   Anemia    Cancer (HCC)    CHF (congestive heart failure) (HCC)    02/16/20 HFrEF 20-25% in setting of acute DVT/PE, underlying CAD   CKD (chronic kidney disease)    Coronary artery disease    02/20/20: CTO pRCA with left-to-right collaterals, 50% mLAD, 80% OM1, medical therapy   Depression  Diabetes (HCC)    Diabetes mellitus without complication (HCC)    DVT (deep venous thrombosis) (HCC) 02/16/2020   RLE DVT proximal veins 02/16/20 Duplex   Hyperlipidemia    Hypertension    Myocardial infarction [facility]) 2009   Stress related per patient; NSTEMI 2012 100% RCA with left-to-right collaterals   PE (pulmonary thromboembolism) (HCC) 02/15/2020   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, RLE BKA   Peripheral artery disease (HCC)     SURGICAL HISTORY: Past Surgical History:  Procedure Laterality Date   IR IMAGING GUIDED PORT INSERTION  07/27/2021   Right lower extremity BKA Right     SOCIAL HISTORY: Social History   Socioeconomic History   Marital status: Married    Spouse name: Not on file    Number of children: Not on file   Years of education: Not on file   Highest education level: Not on file  Occupational History   Not on file  Tobacco Use   Smoking status: Former    Packs/day: .25    Types: Cigars, Cigarettes    Quit date: 01/17/2021    Years since quitting: 1.7   Smokeless tobacco: Never  Vaping Use   Vaping Use: Never used  Substance and Sexual Activity   Alcohol use: Never   Drug use: Not Currently   Sexual activity: Not Currently  Other Topics Concern   Not on file  Social History Narrative   Not on file   Social Determinants of Health   Financial Resource Strain: Low Risk  (12/15/2021)   Overall Financial Resource Strain (CARDIA)    Difficulty of Paying Living Expenses: Not very hard  Food Insecurity: No Food Insecurity (12/15/2021)   Hunger Vital Sign    Worried About Running Out of Food in the Last Year: Never true    Ran Out of Food in the Last Year: Never true  Transportation Needs: Unmet Transportation Needs (10/05/2022)   PRAPARE - Transportation    Lack of Transportation (Medical): Yes    Lack of Transportation (Non-Medical): Yes  Physical Activity: Not on file  Stress: Stress Concern Present (12/15/2021)   [employer] of Occupational Health - Occupational Stress Questionnaire    Feeling of Stress : To some extent  Social Connections: Moderately Isolated (12/15/2021)   Social Connection and Isolation Panel [NHANES]    Frequency of Communication with Friends and Family: Twice a week    Frequency of Social Gatherings with Friends and Family: Twice a week    Attends Religious Services: Never    Database administrator or Organizations: No    Attends Engineer, structural: Never    Marital Status: Married  Catering manager Violence: Not on file    FAMILY HISTORY: Family History  Problem Relation Age of Onset   Heart disease Mother    Diabetes Mother    Heart disease Father    Diabetes Father     ALLERGIES:  is allergic to  lisinopril.  MEDICATIONS:  Current Outpatient Medications  Medication Sig Dispense Refill   ACCU-CHEK GUIDE test strip USE UP TO 4 TIMES A DAY AS DIRECTED 100 strip 1   Accu-Chek Softclix Lancets lancets SMARTSIG:Topical 1-4 Times Daily     aspirin EC 81 MG tablet Take 81 mg by mouth in the morning. Swallow whole.     blood glucose meter kit and supplies KIT Dispense based on patient and insurance preference. Use up to four times daily as directed. 1 each 0  carboxymethylcellulose (REFRESH PLUS) 0.5 % SOLN Place 1 drop into both eyes in the morning and at bedtime.     cephALEXin (KEFLEX) 500 MG capsule Take 1 capsule (500 mg total) by mouth 3 (three) times daily. 21 capsule 0   diphenhydrAMINE (BENADRYL) 25 MG tablet Take 25 mg by mouth at bedtime.     DULoxetine (CYMBALTA) 20 MG capsule SMARTSIG:2 Capsule(s) By Mouth Morning-Night     HUMALOG KWIKPEN 100 UNIT/ML KwikPen Inject 8 Units into the skin 3 (three) times daily after meals. 15 mL 11   insulin degludec (TRESIBA) 100 UNIT/ML FlexTouch Pen Inject 10 Units into the skin 2 (two) times daily.     isosorbide dinitrate (ISORDIL) 30 MG tablet Take 1 tablet (30 mg total) by mouth 3 (three) times daily. 270 tablet 1   ketoconazole (NIZORAL) 2 % cream APPLY TO FEET TOPICALLY IN THE MORNING AND AT BED DAILY 60 g 1   metFORMIN (GLUCOPHAGE-XR) 500 MG 24 hr tablet TAKE 1 TABLET BY MOUTH TWICE A DAY 180 tablet 2   metoprolol succinate (TOPROL-XL) 50 MG 24 hr tablet Take 1 tablet (50 mg total) by mouth daily. Take with or immediately following a meal. 90 tablet 1   Multiple Vitamin (MULTIVITAMIN WITH MINERALS) TABS tablet Take 1 tablet by mouth in the morning. Adults 50+     ondansetron (ZOFRAN) 8 MG tablet TAKE 1 TABLET 2 TIMES DAILY AS NEEDED (NAUSEA/VOMITING). START IF NEEDED ON 3RD DAY AFTER CISPLATIN. 30 tablet 1   potassium chloride SA (KLOR-CON M20) 20 MEQ tablet TAKE 1 TABLET BY MOUTH EVERY DAY 90 tablet 0   spironolactone (ALDACTONE) 25 MG  tablet Take 1 tablet (25 mg total) by mouth daily. 90 tablet 1   pantoprazole (PROTONIX) 40 MG tablet Take 1 tablet (40 mg total) by mouth daily. (Patient not taking: Reported on 08/08/2022) 30 tablet 2   prochlorperazine (COMPAZINE) 10 MG tablet TAKE 1 TABLET (10 MG TOTAL) BY MOUTH EVERY 6 (SIX) HOURS AS NEEDED (NAUSEA OR VOMITING). (Patient not taking: Reported on 08/08/2022) 30 tablet 1   No current facility-administered medications for this visit.   Facility-Administered Medications Ordered in Other Visits  Medication Dose Route Frequency Provider Last Rate Last Admin   heparin lock flush 100 UNIT/ML injection            heparin lock flush 100 unit/mL  500 Units Intravenous Once Rickard Patience, MD         PHYSICAL EXAMINATION: ECOG PERFORMANCE STATUS: 1 - Symptomatic but completely ambulatory Vitals:   10/24/22 1304  BP: 113/63  Pulse: 63  Resp: 18  Temp: (!) 96 F (35.6 C)  SpO2: 100%   Filed Weights   10/24/22 1304  Weight: 108 lb 6.4 oz (49.2 kg)    Physical Exam Constitutional:      General: She is not in acute distress.    Appearance: She is not ill-appearing.  HENT:     Head: Normocephalic and atraumatic.  Eyes:     General: No scleral icterus. Cardiovascular:     Rate and Rhythm: Normal rate and regular rhythm.     Heart sounds: Normal heart sounds.  Pulmonary:     Effort: Pulmonary effort is normal. No respiratory distress.     Breath sounds: No wheezing.     Comments: Absent breath sounds left basilar Abdominal:     General: Bowel sounds are normal. There is no distension.     Palpations: Abdomen is soft.  Musculoskeletal:  General: No deformity. Normal range of motion.     Cervical back: Normal range of motion and neck supple.     Comments: Right lower extremity BKA  Skin:    General: Skin is warm and dry.     Findings: No erythema or rash.  Neurological:     Mental Status: She is alert and oriented to person, place, and time. Mental status is at  baseline.     Cranial Nerves: No cranial nerve deficit.     Coordination: Coordination normal.  Psychiatric:        Mood and Affect: Mood normal.     LABORATORY DATA:  I have reviewed the data as listed     Latest Ref Rng & Units 10/19/2022    1:56 PM 10/13/2022    8:18 PM 07/03/2022   12:38 PM  CBC  WBC 4.0 - 10.5 K/uL 4.8  6.9  4.9   Hemoglobin 12.0 - 15.0 g/dL 46.9  62.9  52.8   Hematocrit 36.0 - 46.0 % 33.5  47.2  33.3   Platelets 150 - 400 K/uL 367  399  317       Latest Ref Rng & Units 10/13/2022    8:18 PM 07/03/2022   12:38 PM 06/09/2022   12:00 AM  CMP  Glucose 70 - 99 mg/dL 413  92    BUN 8 - 23 mg/dL 46  25    Creatinine 2.44 - 1.00 mg/dL 0.10  2.72  1.3      Sodium 135 - 145 mmol/L 136  139  141      Potassium 3.5 - 5.1 mmol/L 6.0  4.5  4.1      Chloride 98 - 111 mmol/L 112  111    CO2 22 - 32 mmol/L 13  22    Calcium 8.9 - 10.3 mg/dL 8.6  8.1    Total Protein 6.5 - 8.1 g/dL 6.8  5.4    Total Bilirubin 0.3 - 1.2 mg/dL 0.5  <5.3    Alkaline Phos 38 - 126 U/L 122  107    AST 15 - 41 U/L 30  19    ALT 0 - 44 U/L 17  12       This result is from an external source.        RADIOGRAPHIC STUDIES: I have personally reviewed the radiological images as listed and agreed with the findings in the report. CT Head Wo Contrast  Result Date: 10/13/2022 CLINICAL DATA:  Mental status change EXAM: CT HEAD WITHOUT CONTRAST TECHNIQUE: Contiguous axial images were obtained from the base of the skull through the vertex without intravenous contrast. RADIATION DOSE REDUCTION: This exam was performed according to the departmental dose-optimization program which includes automated exposure control, adjustment of the mA and/or kV according to patient size and/or use of iterative reconstruction technique. COMPARISON:  Head CT 12/13/2008 FINDINGS: Brain: No evidence of acute infarction, hemorrhage, hydrocephalus, extra-axial collection or mass lesion/mass effect. There is mild  periventricular white matter hypodensity, likely chronic small vessel ischemic change. Vascular: Atherosclerotic calcifications are present within the cavernous internal carotid arteries. Skull: Normal. Negative for fracture or focal lesion. Sinuses/Orbits: There is mucosal thickening of the left sphenoid sinus. Mastoid air cells are clear. Visualized orbits are within normal limits. Other: None. IMPRESSION: 1. No acute intracranial process. 2. Mild chronic small vessel ischemic changes. Electronically Signed   By: Darliss Cheney M.D.   On: 10/13/2022 23:31   CT Chest Wo Contrast  Result Date:  10/08/2022 CLINICAL DATA:  Lung cancer, status post resection and chemotherapy EXAM: CT CHEST WITHOUT CONTRAST TECHNIQUE: Multidetector CT imaging of the chest was performed following the standard protocol without IV contrast. RADIATION DOSE REDUCTION: This exam was performed according to the departmental dose-optimization program which includes automated exposure control, adjustment of the mA and/or kV according to patient size and/or use of iterative reconstruction technique. COMPARISON:  07/03/2022 FINDINGS: Cardiovascular: Heart is normal in size. No pericardial effusion. Leftward cardiomediastinal shift. No evidence of thoracic aneurysm. Atherosclerotic calcifications of the aortic arch. Moderate three-vessel coronary atherosclerosis. Right chest port terminates at the cavoatrial junction. Mediastinum/Nodes: Small mediastinal nodes, including an 8 mm short axis low right paratracheal node (series 2/image 87), grossly unchanged. Visualized thyroid is unremarkable. Lungs/Pleura: Status post left lower lobectomy. Scarring/postsurgical changes in the medial left upper lung. No suspicious pulmonary nodules. No focal consolidation. No pleural effusion or pneumothorax. Upper Abdomen: Visualized upper abdomen is notable for prior cholecystectomy and vascular calcifications. Musculoskeletal: Visualized osseous structures are  within normal limits. IMPRESSION: Status post left lower lobectomy. No evidence of recurrent or metastatic disease. Aortic Atherosclerosis (ICD10-I70.0). Electronically Signed   By: Charline Bills M.D.   On: 10/08/2022 17:15

## 2022-10-24 NOTE — Assessment & Plan Note (Signed)
Chronic anemia, hemoglobin slightly decreased.  Possible anemia due to CKD.  Hemoglobin slightly improved.  I recommend 1 more dose of IV Venofer.   

## 2022-11-24 ENCOUNTER — Inpatient Hospital Stay: Payer: Medicaid Other | Attending: Oncology

## 2022-11-24 ENCOUNTER — Inpatient Hospital Stay: Payer: Medicaid Other

## 2022-11-24 DIAGNOSIS — E538 Deficiency of other specified B group vitamins: Secondary | ICD-10-CM | POA: Diagnosis not present

## 2022-11-24 DIAGNOSIS — C3432 Malignant neoplasm of lower lobe, left bronchus or lung: Secondary | ICD-10-CM | POA: Diagnosis present

## 2022-11-24 DIAGNOSIS — Z79899 Other long term (current) drug therapy: Secondary | ICD-10-CM | POA: Insufficient documentation

## 2022-11-24 DIAGNOSIS — C3492 Malignant neoplasm of unspecified part of left bronchus or lung: Secondary | ICD-10-CM

## 2022-11-24 MED ORDER — CYANOCOBALAMIN 1000 MCG/ML IJ SOLN
1000.0000 ug | Freq: Once | INTRAMUSCULAR | Status: AC
  Administered 2022-11-24: 1000 ug via INTRAMUSCULAR
  Filled 2022-11-24: qty 1

## 2022-12-12 ENCOUNTER — Other Ambulatory Visit: Payer: Self-pay | Admitting: Family Medicine

## 2022-12-12 DIAGNOSIS — I25118 Atherosclerotic heart disease of native coronary artery with other forms of angina pectoris: Secondary | ICD-10-CM

## 2022-12-12 DIAGNOSIS — G893 Neoplasm related pain (acute) (chronic): Secondary | ICD-10-CM

## 2022-12-25 ENCOUNTER — Inpatient Hospital Stay: Payer: Medicaid Other

## 2022-12-27 LAB — HM PAP SMEAR: HM Pap smear: ABNORMAL

## 2022-12-27 LAB — RESULTS CONSOLE HPV: CHL HPV: NEGATIVE

## 2023-01-24 ENCOUNTER — Inpatient Hospital Stay: Payer: Medicaid Other

## 2023-01-29 ENCOUNTER — Inpatient Hospital Stay: Payer: Medicaid Other | Attending: Oncology

## 2023-01-29 ENCOUNTER — Inpatient Hospital Stay: Payer: Medicaid Other

## 2023-01-29 DIAGNOSIS — N1832 Chronic kidney disease, stage 3b: Secondary | ICD-10-CM | POA: Insufficient documentation

## 2023-01-29 DIAGNOSIS — D631 Anemia in chronic kidney disease: Secondary | ICD-10-CM | POA: Diagnosis not present

## 2023-01-29 DIAGNOSIS — E538 Deficiency of other specified B group vitamins: Secondary | ICD-10-CM | POA: Diagnosis not present

## 2023-01-29 DIAGNOSIS — C3492 Malignant neoplasm of unspecified part of left bronchus or lung: Secondary | ICD-10-CM

## 2023-01-29 DIAGNOSIS — C3432 Malignant neoplasm of lower lobe, left bronchus or lung: Secondary | ICD-10-CM | POA: Insufficient documentation

## 2023-01-29 DIAGNOSIS — Z79899 Other long term (current) drug therapy: Secondary | ICD-10-CM | POA: Diagnosis not present

## 2023-01-29 MED ORDER — CYANOCOBALAMIN 1000 MCG/ML IJ SOLN
1000.0000 ug | Freq: Once | INTRAMUSCULAR | Status: AC
  Administered 2023-01-29: 1000 ug via INTRAMUSCULAR
  Filled 2023-01-29: qty 1

## 2023-02-09 IMAGING — CT CT CHEST W/O CM
2 of 4 series · 15 of 36 positions shown, 18 images · non-contrast
Comparison: 06/08/2021

CLINICAL DATA: Lung cancer, left lower lung resection, chemotherapy
finished in April 2021.



[Series 2: chest 2.00 · axial · 0.64mm/px · z∈[-1189,-917]mm · 12 of 162 slices shown, 15 images]
[im 13/162  mediastinal]
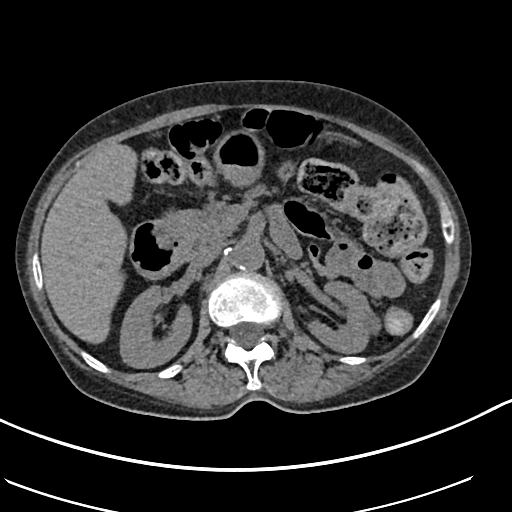
[im 13/162  lung]
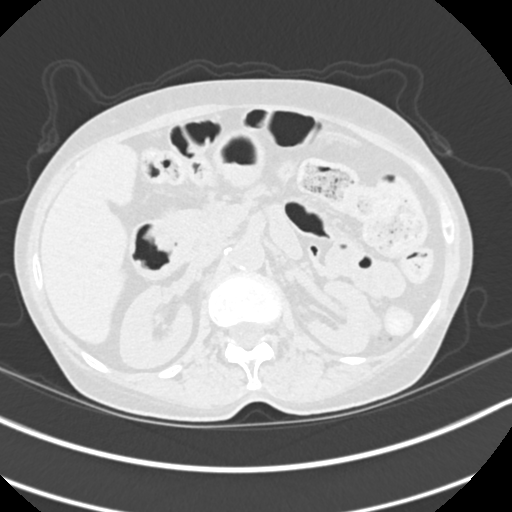
[im 25/162  lung]
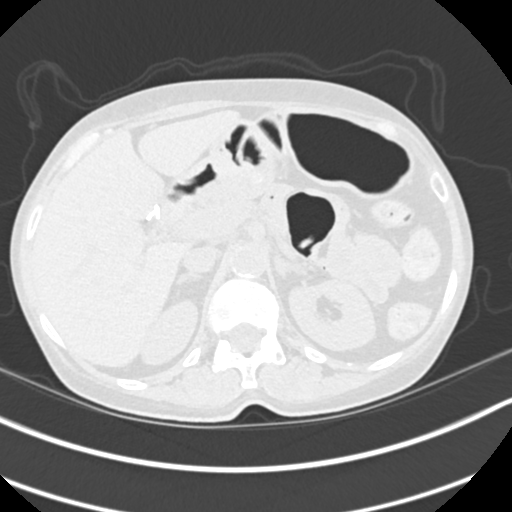
[im 38/162  lung]
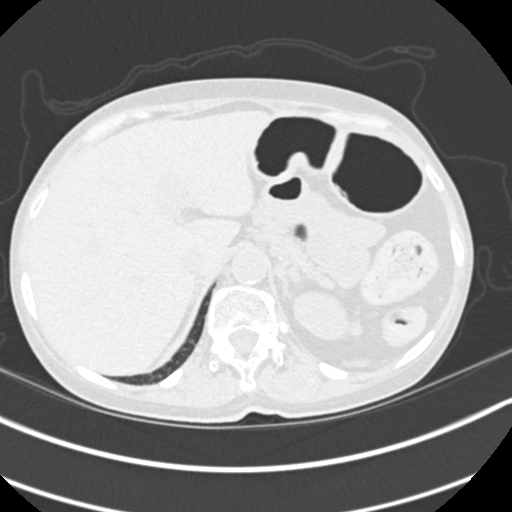
[im 50/162  lung]
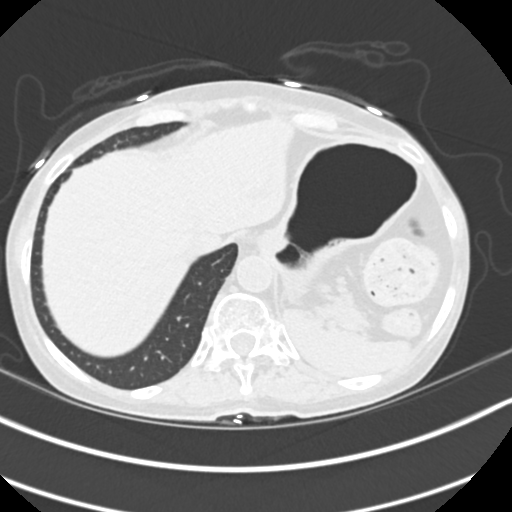
[im 62/162  mediastinal]
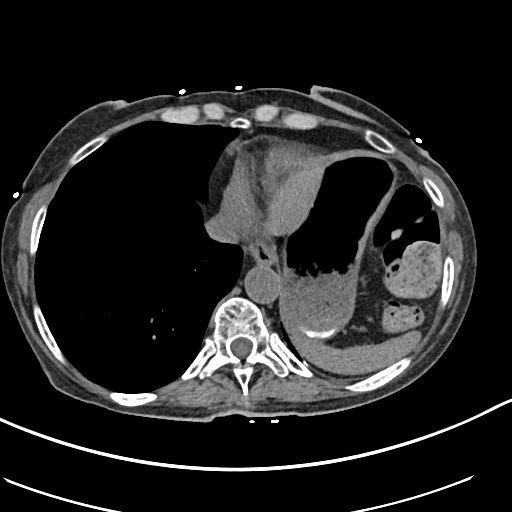
[im 62/162  lung]
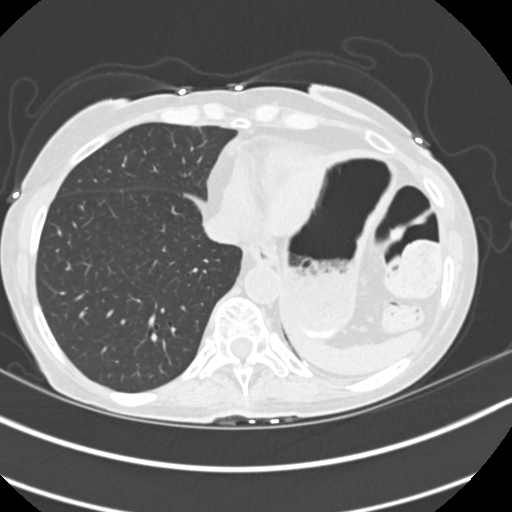
[im 75/162  lung]
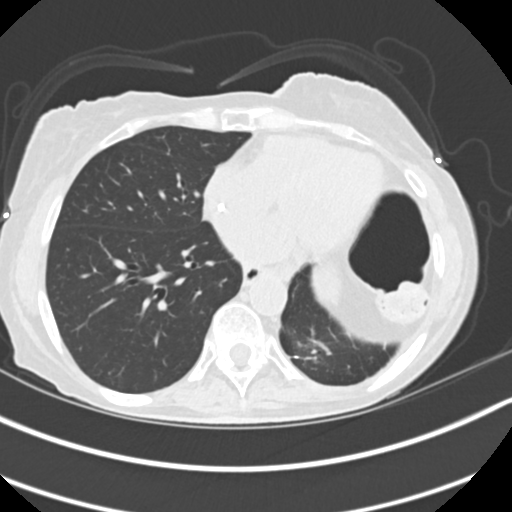
[im 87/162  lung]
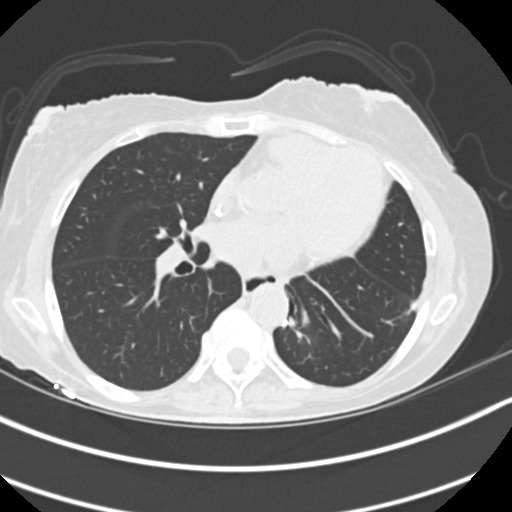
[im 100/162  lung]
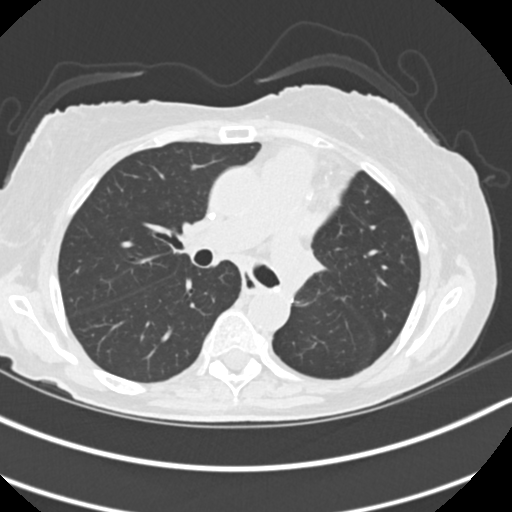
[im 112/162  mediastinal]
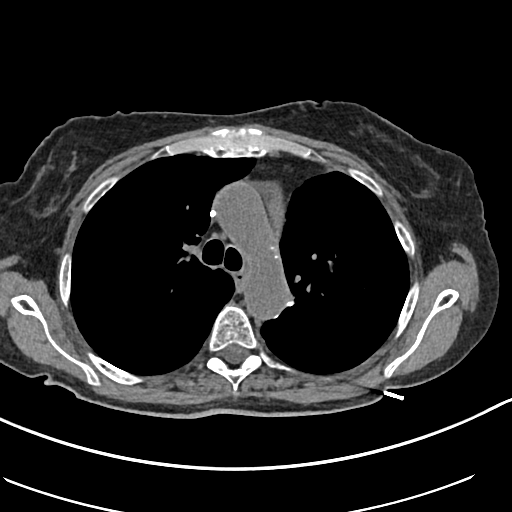
[im 112/162  lung]
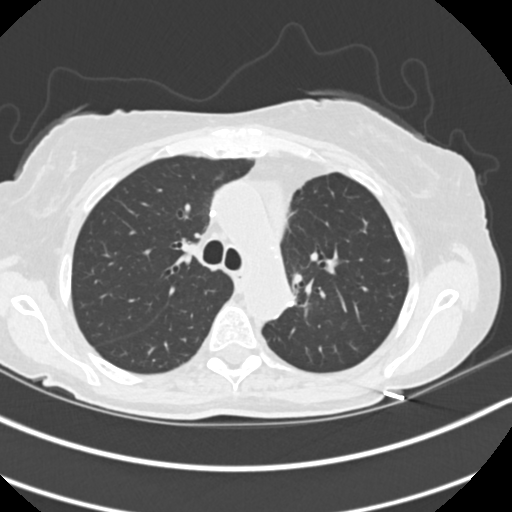
[im 124/162  lung]
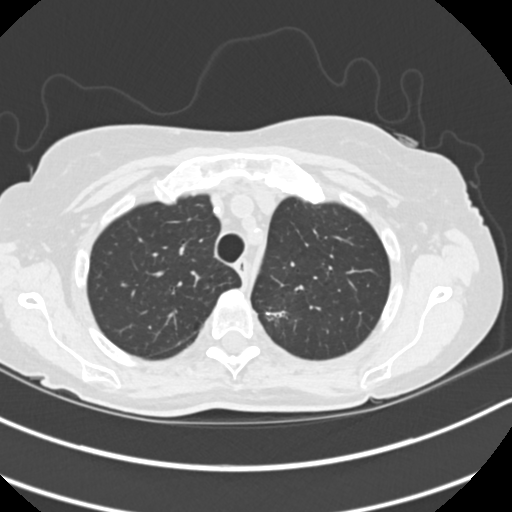
[im 137/162  lung]
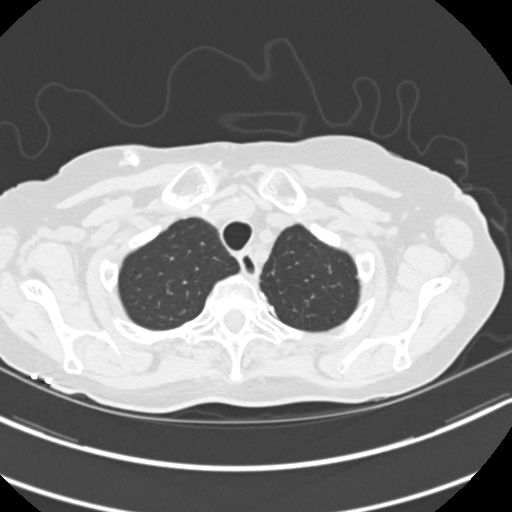
[im 149/162  lung]
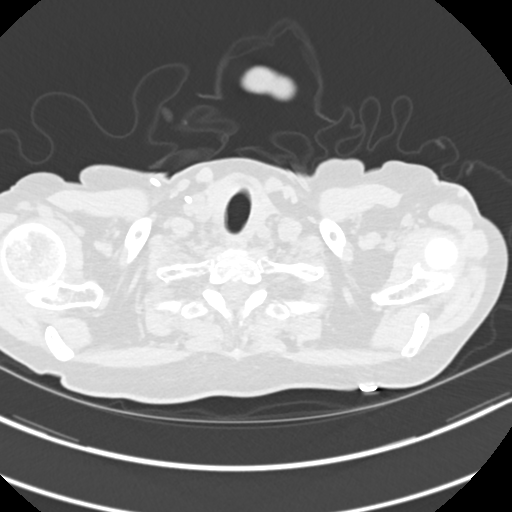

[Series 5: coronals chest 2.00 cor · coronal · 0.63mm/px · 3 of 150 slices shown]
[im 30/150  lung]
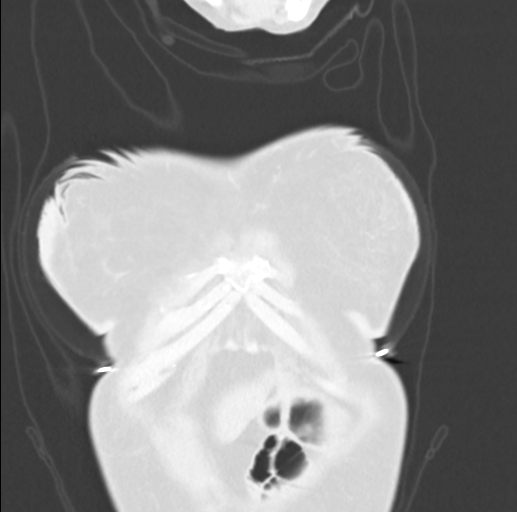
[im 60/150  lung]
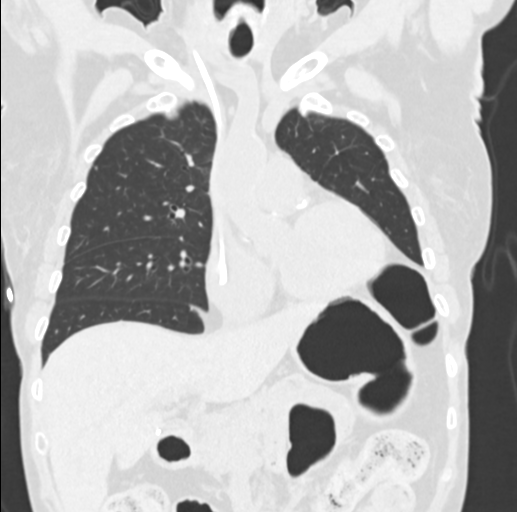
[im 90/150  lung]
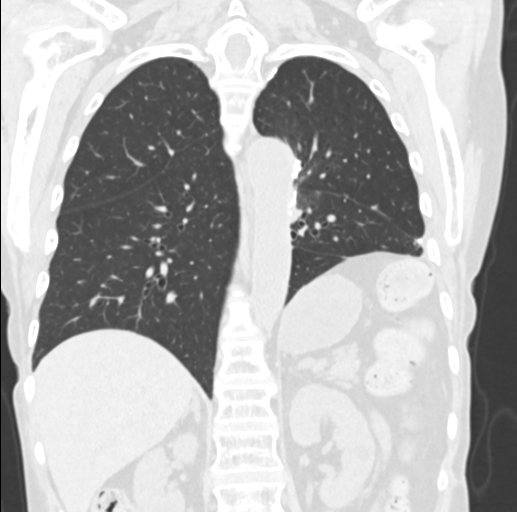

[15 of 36 positions shown; findings below may reference images not displayed]

FINDINGS: Cardiovascular: Right Port-A-Cath tip: Right atrium. Coronary,
aortic arch, and branch vessel atherosclerotic vascular disease.

Mediastinum/Nodes: No pathologic adenopathy is currently identified.

Lungs/Pleura: Left lower lobectomy. Stable scarring in the left
upper lobe. Accessory septation along the margin of the lingula.
Expected volume loss in the left hemithorax. No recurrence
identified.

Upper Abdomen: Nonspecific hypodense left renal lesions. Abdominal
aortic atherosclerosis. No adrenal mass.

Musculoskeletal: Unremarkable
IMPRESSION: 1. Left lower lobectomy, no recurrent malignancy in the chest
identified.
2.  Aortic Atherosclerosis ([id number]).  Coronary atherosclerosis.
3. Small left renal hypodense lesions are probably benign but
technically nonspecific on today's noncontrast chest CT.

## 2023-02-19 ENCOUNTER — Encounter: Payer: Self-pay | Admitting: Oncology

## 2023-02-26 ENCOUNTER — Encounter: Payer: Self-pay | Admitting: Oncology

## 2023-02-26 ENCOUNTER — Inpatient Hospital Stay: Payer: 59

## 2023-02-26 ENCOUNTER — Ambulatory Visit: Payer: Self-pay

## 2023-02-26 ENCOUNTER — Inpatient Hospital Stay: Payer: 59 | Attending: Oncology

## 2023-02-26 DIAGNOSIS — Z79899 Other long term (current) drug therapy: Secondary | ICD-10-CM | POA: Insufficient documentation

## 2023-02-26 DIAGNOSIS — C3492 Malignant neoplasm of unspecified part of left bronchus or lung: Secondary | ICD-10-CM

## 2023-02-26 DIAGNOSIS — E538 Deficiency of other specified B group vitamins: Secondary | ICD-10-CM | POA: Diagnosis not present

## 2023-02-26 DIAGNOSIS — C3432 Malignant neoplasm of lower lobe, left bronchus or lung: Secondary | ICD-10-CM | POA: Insufficient documentation

## 2023-02-26 DIAGNOSIS — D631 Anemia in chronic kidney disease: Secondary | ICD-10-CM | POA: Insufficient documentation

## 2023-02-26 DIAGNOSIS — N1832 Chronic kidney disease, stage 3b: Secondary | ICD-10-CM | POA: Insufficient documentation

## 2023-02-26 DIAGNOSIS — Z95828 Presence of other vascular implants and grafts: Secondary | ICD-10-CM

## 2023-02-26 MED ORDER — CYANOCOBALAMIN 1000 MCG/ML IJ SOLN
1000.0000 ug | Freq: Once | INTRAMUSCULAR | Status: AC
  Administered 2023-02-26: 1000 ug via INTRAMUSCULAR
  Filled 2023-02-26: qty 1

## 2023-02-26 MED ORDER — SODIUM CHLORIDE 0.9% FLUSH
10.0000 mL | Freq: Once | INTRAVENOUS | Status: AC
  Administered 2023-02-26: 10 mL via INTRAVENOUS
  Filled 2023-02-26: qty 10

## 2023-02-26 MED ORDER — HEPARIN SOD (PORK) LOCK FLUSH 100 UNIT/ML IV SOLN
500.0000 [IU] | Freq: Once | INTRAVENOUS | Status: AC
  Administered 2023-02-26: 500 [IU] via INTRAVENOUS
  Filled 2023-02-26: qty 5

## 2023-02-26 NOTE — Telephone Encounter (Signed)
Message from Phill Myron sent at 02/26/2023  3:52 PM EDT  Summary: stomach pain after every meal   Stomach pain after every meal .. Going on for the last two weeks         Chief Complaint: stomach pain after each meal for 45 minutes then goes away (mild) Symptoms: gradual onset Frequency: 2 weeks ago Pertinent Negatives: Patient denies radiating pain vomiting or nausea Disposition: [] ED /[] Urgent Care (no appt availability in office) / [x] Appointment(In office/virtual)/ []  [facility]/ [] Home Care/ [] Refused Recommended Disposition /[] [facility]/ []  Follow-up with PCP Additional Notes: scheduled pt with next available appt with PCP- Pt does not want to see another provider. Pt would need 2 days notice if an appt is sooner.  Reason for Disposition  Age > 60 years  Answer Assessment - Initial Assessment Questions 1. LOCATION: "Where does it hurt?"      Epigastric  2. RADIATION: "Does the pain shoot anywhere else?" (e.g., chest, back)     no 3. ONSET: "When did the pain begin?" (e.g., minutes, hours or days ago)      2 weeks ago  4. SUDDEN: "Gradual or sudden onset?"     gradual 5. PATTERN "Does the pain come and go, or is it constant?"    - If it comes and goes: "How long does it last?" "Do you have pain now?"     (Note: Comes and goes means the pain is intermittent. It goes away completely between bouts.)    - If constant: "Is it getting better, staying the same, or getting worse?"      (Note: Constant means the pain never goes away completely; most serious pain is constant and gets worse.)      Comes and goes and last 45 minutes  6. SEVERITY: "How bad is the pain?"  (e.g., Scale 1-10; mild, moderate, or severe)    - MILD (1-3): Doesn't interfere with normal activities, abdomen soft and not tender to touch.     - MODERATE (4-7): Interferes with normal activities or awakens from sleep, abdomen tender to touch.     - SEVERE (8-10): Excruciating  pain, doubled over, unable to do any normal activities.       mild 7. RECURRENT SYMPTOM: "Have you ever had this type of stomach pain before?" If Yes, ask: "When was the last time?" and "What happened that time?"      no 10. OTHER SYMPTOMS: "Do you have any other symptoms?" (e.g., back pain, diarrhea, fever, urination pain, vomiting)       no  Protocols used: Abdominal Pain - Female-A-AH

## 2023-03-05 ENCOUNTER — Ambulatory Visit (INDEPENDENT_AMBULATORY_CARE_PROVIDER_SITE_OTHER): Payer: 59 | Admitting: Family Medicine

## 2023-03-05 ENCOUNTER — Encounter: Payer: Self-pay | Admitting: Family Medicine

## 2023-03-05 VITALS — BP 150/90 | HR 90 | Ht 65.0 in | Wt 113.0 lb

## 2023-03-05 DIAGNOSIS — Z23 Encounter for immunization: Secondary | ICD-10-CM

## 2023-03-05 DIAGNOSIS — K2900 Acute gastritis without bleeding: Secondary | ICD-10-CM | POA: Diagnosis not present

## 2023-03-05 MED ORDER — PANTOPRAZOLE SODIUM 40 MG PO TBEC
40.0000 mg | DELAYED_RELEASE_TABLET | Freq: Every day | ORAL | 1 refills | Status: DC
Start: 2023-03-05 — End: 2023-05-26

## 2023-03-05 NOTE — Addendum Note (Signed)
Addended by: Everitt Amber on: 03/05/2023 03:23 PM   Modules accepted: Orders

## 2023-03-05 NOTE — Progress Notes (Signed)
Date:  03/05/2023   Name:  SURY FIDALGO   DOB:  11/09/1955   MRN:  [id number]   Chief Complaint: [place] [employer] (Bloated and gassy after meals ) and Flu Vaccine  Abdominal Pain This is a new problem. The current episode started more than 1 month ago (after colposcopy 8/28). The onset quality is gradual. The problem occurs daily. The problem has been waxing and waning. The pain is located in the generalized abdominal region. The pain is moderate. The quality of the pain is colicky. The abdominal pain does not radiate. Pertinent negatives include no constipation, diarrhea, dysuria, fever, frequency, hematochezia, hematuria, melena, myalgias, nausea, vomiting or weight loss. The pain is aggravated by eating. The pain is relieved by Nothing. She has tried nothing (off pantoprazole) for the symptoms.    Lab Results  Component Value Date   NA 136 10/13/2022   K 6.0 (H) 10/13/2022   CO2 13 (L) 10/13/2022   GLUCOSE 298 (H) 10/13/2022   BUN 46 (H) 10/13/2022   CREATININE 2.07 (H) 10/13/2022   CALCIUM 8.6 (L) 10/13/2022   GFRNONAA 26 (L) 10/13/2022   Lab Results  Component Value Date   CHOL 248 (A) 06/09/2022   HDL 70 06/09/2022   LDLCALC 158 06/09/2022   TRIG 98 06/09/2022   Lab Results  Component Value Date   TSH 2.055 08/17/2021   Lab Results  Component Value Date   HGBA1C 7.4 06/09/2022   Lab Results  Component Value Date   WBC 4.8 10/19/2022   HGB 10.3 (L) 10/19/2022   HCT 33.5 (L) 10/19/2022   MCV 95.2 10/19/2022   PLT 367 10/19/2022   Lab Results  Component Value Date   ALT 17 10/13/2022   AST 30 10/13/2022   ALKPHOS 122 10/13/2022   BILITOT 0.5 10/13/2022   No results found for: "25OHVITD2", "25OHVITD3", "VD25OH"   Review of Systems  Constitutional:  Negative for fever and weight loss.  HENT:  Negative for trouble swallowing.   Respiratory:  Negative for chest tightness, shortness of breath and wheezing.   Cardiovascular:  Negative for chest pain.  Gastrointestinal:   Positive for abdominal pain. Negative for constipation, diarrhea, hematochezia, melena, nausea and vomiting.  Genitourinary:  Negative for dysuria, flank pain, frequency, hematuria and vaginal bleeding.  Musculoskeletal:  Negative for myalgias.    Patient Active Problem List   Diagnosis Date Noted   Vitamin B12 deficiency 07/10/2022   Port-A-Cath in place 07/10/2022   Hypokalemia 12/26/2021   Debility    Liver function test abnormality    Hypovolemic shock (HCC) 08/03/2021   AKI (acute kidney injury) (HCC) 08/03/2021   Transaminitis 08/02/2021   Chemotherapy-induced nausea 07/28/2021   Encounter for antineoplastic immunotherapy 07/07/2021   Macrocytic anemia 07/07/2021   Moderate protein-calorie malnutrition (HCC) 04/12/2021   Stage 3b chronic kidney disease (HCC) 04/12/2021   Anemia in chronic kidney disease (CKD) 04/12/2021   Dyslipidemia 04/05/2021   Encounter for antineoplastic chemotherapy 02/28/2021   Non-small cell cancer of left lung (HCC) 02/17/2021   Tinea pedis of both feet 02/10/2021   Chronic pain due to neoplasm 02/10/2021   Adenocarcinoma, lung, left (HCC) 01/19/2021   S/P Robotic Assisted Video Thoracoscopy with Left Lower Lobectomy Lung, Intercostal nerve block, lymph node dissection 01/17/2021   Non-small cell lung cancer (HCC) 12/30/2020   Depression 12/09/2020   Hyperlipidemia 12/09/2020   Primary hypertension 12/09/2020   Heart failure with reduced ejection fraction (HCC) 02/16/2020   Multiple subsegmental pulmonary emboli without acute cor  pulmonale (HCC) 02/15/2020   Iron deficiency anemia 12/18/2019   Rectal pain 12/18/2019   Diabetic foot ulcer (HCC) 10/03/2019   Cellulitis 08/17/2019   Cocaine use 08/17/2019   Peripheral vascular disease (HCC) 08/17/2019   Tobacco use disorder 08/17/2019   Tobacco abuse, in remission 08/17/2019   Cervical dysplasia 04/02/2019   CAD (coronary artery disease), native coronary artery 06/06/2010   Type II diabetes  mellitus (HCC) 06/06/2010   Acute subendocardial infarction, initial episode of care (HCC) 06/05/2010    Allergies  Allergen Reactions   Lisinopril Swelling and Other (See Comments)    Recurrent AKI and hyperkalemia when initiation attempted.    Past Surgical History:  Procedure Laterality Date   IR IMAGING GUIDED PORT INSERTION  07/27/2021   Right lower extremity BKA Right     Social History   Tobacco Use   Smoking status: Former    Current packs/day: 0.00    Types: Cigars, Cigarettes    Quit date: 01/17/2021    Years since quitting: 2.1   Smokeless tobacco: Never  Vaping Use   Vaping status: Never Used  Substance Use Topics   Alcohol use: Never   Drug use: Not Currently     Medication list has been reviewed and updated.  Current Meds  Medication Sig   ACCU-CHEK GUIDE test strip USE UP TO 4 TIMES A DAY AS DIRECTED   Accu-Chek Softclix Lancets lancets SMARTSIG:Topical 1-4 Times Daily   aspirin EC 81 MG tablet Take 81 mg by mouth in the morning. Swallow whole.   blood glucose meter kit and supplies KIT Dispense based on patient and insurance preference. Use up to four times daily as directed.   carboxymethylcellulose (REFRESH PLUS) 0.5 % SOLN Place 1 drop into both eyes in the morning and at bedtime.   DULoxetine (CYMBALTA) 20 MG capsule SMARTSIG:2 Capsule(s) By Mouth Morning-Night   HUMALOG KWIKPEN 100 UNIT/ML KwikPen Inject 8 Units into the skin 3 (three) times daily after meals.   insulin degludec (TRESIBA) 100 UNIT/ML FlexTouch Pen Inject 10 Units into the skin 2 (two) times daily.   isosorbide dinitrate (ISORDIL) 30 MG tablet TAKE 1 TABLET BY MOUTH 3 TIMES DAILY.   ketoconazole (NIZORAL) 2 % cream APPLY TO FEET TOPICALLY IN THE MORNING AND AT BED DAILY   metFORMIN (GLUCOPHAGE-XR) 500 MG 24 hr tablet TAKE 1 TABLET BY MOUTH TWICE A DAY   metoprolol succinate (TOPROL-XL) 50 MG 24 hr tablet Take 1 tablet (50 mg total) by mouth daily. Take with or immediately following a  meal.   Multiple Vitamin (MULTIVITAMIN WITH MINERALS) TABS tablet Take 1 tablet by mouth in the morning. Adults 50+   potassium chloride SA (KLOR-CON M20) 20 MEQ tablet TAKE 1 TABLET BY MOUTH EVERY DAY   spironolactone (ALDACTONE) 25 MG tablet Take 1 tablet (25 mg total) by mouth daily.       03/05/2023    2:09 PM 08/08/2022    3:04 PM 06/20/2022    1:30 PM 02/10/2021    2:54 PM  GAD 7 : Generalized Anxiety Score  Nervous, Anxious, on Edge 0 0 0 0  Control/stop worrying 0 0 0 0  Worry too much - different things 0 0 0 0  Trouble relaxing 0 0 0 0  Restless 0 0 0 0  Easily annoyed or irritable 0 0 0 1  Afraid - awful might happen 0 0 0 0  Total GAD 7 Score 0 0 0 1  Anxiety Difficulty Not difficult at all  Not difficult at all Not difficult at all Not difficult at all       03/05/2023    2:08 PM 08/08/2022    3:04 PM 06/20/2022    1:30 PM  Depression screen PHQ 2/9  Decreased Interest 0 0 0  Down, Depressed, Hopeless 0 0 0  PHQ - 2 Score 0 0 0  Altered sleeping 0 0 0  Tired, decreased energy 0 0 0  Change in appetite 0 0 0  Feeling bad or failure about yourself  0 0 0  Trouble concentrating 0 0 0  Moving slowly or fidgety/restless 0 0 0  Suicidal thoughts 0 0 0  PHQ-9 Score 0 0 0  Difficult doing work/chores Not difficult at all Not difficult at all Not difficult at all    BP Readings from Last 3 Encounters:  03/05/23 (!) 150/90  10/24/22 113/63  10/14/22 122/68    Physical Exam Vitals and nursing note reviewed.  HENT:     Right Ear: Tympanic membrane and ear canal normal.     Left Ear: Tympanic membrane and ear canal normal.     Nose: Nose normal. No congestion or rhinorrhea.     Mouth/Throat:     Mouth: Mucous membranes are moist.     Pharynx: No oropharyngeal exudate.  Eyes:     Pupils: Pupils are equal, round, and reactive to light.  Cardiovascular:     Heart sounds: No murmur heard.    No friction rub. No gallop.  Pulmonary:     Breath sounds: No wheezing,  rhonchi or rales.  Abdominal:     General: There is no distension.     Palpations: Abdomen is soft. There is no hepatomegaly, splenomegaly or mass.     Tenderness: There is no abdominal tenderness. There is no right CVA tenderness, left CVA tenderness, guarding or rebound.     Hernia: No hernia is present.  Musculoskeletal:     Cervical back: Normal range of motion.  Neurological:     General: No focal deficit present.     Mental Status: She is alert.     Wt Readings from Last 3 Encounters:  03/05/23 113 lb (51.3 kg)  10/24/22 108 lb 6.4 oz (49.2 kg)  10/13/22 145 lb (65.8 kg)    BP (!) 150/90 (BP Location: Right Arm, Cuff Size: Normal)   Pulse 90   Ht 5\' 5"  (1.651 m)   Wt 113 lb (51.3 kg)   SpO2 97%   BMI 18.80 kg/m   Assessment and Plan: 1. Acute gastritis without hemorrhage, unspecified gastritis type New recurrence of her previous problem that patient used to take pantoprazole for.  Since this involving the generalized abdomen we will refill and restart her pantoprazole 40 mg once a day and has requested that the pharmacist pull a preparation was signed methadone to take on an as-needed basis with food for gas and bloating.  We have also given a call to Dr. Janyth Contes to see if he they would like to add the abdomen and pelvic to the CT of the chest that is to be done later this year. - pantoprazole (PROTONIX) 40 MG tablet; Take 1 tablet (40 mg total) by mouth daily.  Dispense: 90 tablet; Refill: 1  2. Flu vaccine need Discussed and administered - Flu vaccine trivalent PF, 6mos and older(Flulaval,Afluria,Fluarix,Fluzone)     Elizabeth Sauer, MD

## 2023-03-15 ENCOUNTER — Other Ambulatory Visit: Payer: Self-pay | Admitting: Family Medicine

## 2023-03-15 DIAGNOSIS — B353 Tinea pedis: Secondary | ICD-10-CM

## 2023-03-15 DIAGNOSIS — I25118 Atherosclerotic heart disease of native coronary artery with other forms of angina pectoris: Secondary | ICD-10-CM

## 2023-03-15 NOTE — Telephone Encounter (Signed)
Medication Refill - Medication: DULoxetine (CYMBALTA) 20 MG capsule [[id number]]   Has the patient contacted their pharmacy? Yes.   (Agent: If no, request that the patient contact the pharmacy for the refill. If patient does not wish to contact the pharmacy document the reason why and proceed with request.) (Agent: If yes, when and what did the pharmacy advise?)  Preferred Pharmacy (with phone number or street name):  [id number] [place], [place] - [address] Phone: [phone]  Fax: [phone]     Has the patient been seen for an appointment in the last year OR does the patient have an upcoming appointment? Yes.    Agent: Please be advised that RX refills may take up to 3 business days. We ask that you follow-up with your pharmacy.

## 2023-03-16 NOTE — Telephone Encounter (Signed)
Requested medications are due for refill today.  unsure  Requested medications are on the active medications list.  yes  Last refill. 09/12/2021  Future visit scheduled.   no  Notes to clinic.  Historical medication.    Requested Prescriptions  Pending Prescriptions Disp Refills   DULoxetine (CYMBALTA) 20 MG capsule       Psychiatry: Antidepressants - SNRI - duloxetine Failed - 03/16/2023  8:31 AM      Failed - Cr in normal range and within 360 days    Creatinine  Date Value Ref Range Status  04/02/2014 0.75 0.60 - 1.30 mg/dL Final   Creatinine, Ser  Date Value Ref Range Status  10/13/2022 2.07 (H) 0.44 - 1.00 mg/dL Final         Failed - eGFR is 30 or above and within 360 days    EGFR (African American)  Date Value Ref Range Status  04/02/2014 >60 >55mL/min Final  11/11/2012 53 (L)  Final   GFR calc Af Amer  Date Value Ref Range Status  08/10/2020 57 (L) >59 mL/min/1.73 Final    Comment:    **In accordance with recommendations from the NKF-ASN Task force,**   [facility] is in the process of updating its eGFR calculation to the   2021 CKD-EPI creatinine equation that estimates kidney function   without a race variable.    EGFR (Non-African Amer.)  Date Value Ref Range Status  04/02/2014 >60 >88mL/min Final    Comment:    eGFR values <16mL/min/1.73 m2 may be an indication of chronic kidney disease (CKD). Calculated eGFR, using the MRDR Study equation, is useful in  patients with stable renal function. The eGFR calculation will not be reliable in acutely ill patients when serum creatinine is changing rapidly. It is not useful in patients on dialysis. The eGFR calculation may not be applicable to patients at the low and high extremes of body sizes, pregnant women, and vegetarians.   11/11/2012 46 (L)  Final    Comment:    eGFR values <7mL/min/1.73 m2 may be an indication of chronic kidney disease (CKD). Calculated eGFR is useful in patients with stable renal  function. The eGFR calculation will not be reliable in acutely ill patients when serum creatinine is changing rapidly. It is not useful in  patients on dialysis. The eGFR calculation may not be applicable to patients at the low and high extremes of body sizes, pregnant women, and vegetarians.    GFR, Estimated  Date Value Ref Range Status  10/13/2022 26 (L) >60 mL/min Final    Comment:    (NOTE) Calculated using the CKD-EPI Creatinine Equation (2021)          Failed - Last BP in normal range    BP Readings from Last 1 Encounters:  03/05/23 (!) 150/90         Passed - Completed PHQ-2 or PHQ-9 in the last 360 days      Passed - Valid encounter within last 6 months    Recent Outpatient Visits           1 week ago Acute gastritis without hemorrhage, unspecified gastritis type   [facility]   7 months ago Primary hypertension   [facility]   8 months ago Primary hypertension   [facility]  1 year ago Primary hypertension   [facility]   1 year ago Primary hypertension   [facility]

## 2023-03-19 ENCOUNTER — Encounter: Payer: Self-pay | Admitting: Oncology

## 2023-03-26 ENCOUNTER — Inpatient Hospital Stay: Payer: 59 | Attending: Oncology

## 2023-03-26 ENCOUNTER — Inpatient Hospital Stay: Payer: 59

## 2023-03-26 DIAGNOSIS — C3432 Malignant neoplasm of lower lobe, left bronchus or lung: Secondary | ICD-10-CM | POA: Insufficient documentation

## 2023-03-26 DIAGNOSIS — Z79899 Other long term (current) drug therapy: Secondary | ICD-10-CM | POA: Insufficient documentation

## 2023-03-26 DIAGNOSIS — E538 Deficiency of other specified B group vitamins: Secondary | ICD-10-CM | POA: Diagnosis not present

## 2023-03-26 DIAGNOSIS — C3492 Malignant neoplasm of unspecified part of left bronchus or lung: Secondary | ICD-10-CM

## 2023-03-26 MED ORDER — CYANOCOBALAMIN 1000 MCG/ML IJ SOLN
1000.0000 ug | Freq: Once | INTRAMUSCULAR | Status: AC
  Administered 2023-03-26: 1000 ug via INTRAMUSCULAR
  Filled 2023-03-26: qty 1

## 2023-04-13 ENCOUNTER — Other Ambulatory Visit: Payer: Self-pay | Admitting: Family Medicine

## 2023-04-13 DIAGNOSIS — I25118 Atherosclerotic heart disease of native coronary artery with other forms of angina pectoris: Secondary | ICD-10-CM

## 2023-04-24 ENCOUNTER — Inpatient Hospital Stay: Payer: 59 | Attending: Oncology

## 2023-04-24 ENCOUNTER — Ambulatory Visit
Admission: RE | Admit: 2023-04-24 | Discharge: 2023-04-24 | Disposition: A | Discharge status: Home / Self Care | Payer: 59 | Source: Ambulatory Visit | Attending: Oncology | Admitting: Oncology

## 2023-04-24 ENCOUNTER — Inpatient Hospital Stay: Payer: 59

## 2023-04-24 DIAGNOSIS — C3490 Malignant neoplasm of unspecified part of unspecified bronchus or lung: Secondary | ICD-10-CM | POA: Insufficient documentation

## 2023-04-24 DIAGNOSIS — Z95828 Presence of other vascular implants and grafts: Secondary | ICD-10-CM

## 2023-04-24 DIAGNOSIS — C3432 Malignant neoplasm of lower lobe, left bronchus or lung: Secondary | ICD-10-CM | POA: Diagnosis not present

## 2023-04-24 DIAGNOSIS — Z79899 Other long term (current) drug therapy: Secondary | ICD-10-CM | POA: Insufficient documentation

## 2023-04-24 LAB — CMP (CANCER CENTER ONLY)
ALT: 16 U/L (ref 0–44)
AST: 19 U/L (ref 15–41)
Albumin: 2.1 g/dL — ABNORMAL LOW (ref 3.5–5.0)
Alkaline Phosphatase: 90 U/L (ref 38–126)
Anion gap: 6 (ref 5–15)
BUN: 41 mg/dL — ABNORMAL HIGH (ref 8–23)
CO2: 17 mmol/L — ABNORMAL LOW (ref 22–32)
Calcium: 7.4 mg/dL — ABNORMAL LOW (ref 8.9–10.3)
Chloride: 116 mmol/L — ABNORMAL HIGH (ref 98–111)
Creatinine: 3.36 mg/dL — ABNORMAL HIGH (ref 0.44–1.00)
GFR, Estimated: 15 mL/min — ABNORMAL LOW (ref >60)
Glucose, Bld: 266 mg/dL — ABNORMAL HIGH (ref 70–99)
Potassium: 3.9 mmol/L (ref 3.5–5.1)
Sodium: 139 mmol/L (ref 135–145)
Total Bilirubin: 0.2 mg/dL (ref <1.2)
Total Protein: 5.4 g/dL — ABNORMAL LOW (ref 6.5–8.1)

## 2023-04-24 LAB — CBC WITH DIFFERENTIAL (CANCER CENTER ONLY)
Abs Immature Granulocytes: 0.02 10*3/uL (ref 0.00–0.07)
Basophils Absolute: 0 10*3/uL (ref 0.0–0.1)
Basophils Relative: 1 %
Eosinophils Absolute: 0.2 10*3/uL (ref 0.0–0.5)
Eosinophils Relative: 3 %
HCT: 30 % — ABNORMAL LOW (ref 36.0–46.0)
Hemoglobin: 9.4 g/dL — ABNORMAL LOW (ref 12.0–15.0)
Immature Granulocytes: 0 %
Lymphocytes Relative: 23 %
Lymphs Abs: 1.3 10*3/uL (ref 0.7–4.0)
MCH: 29.6 pg (ref 26.0–34.0)
MCHC: 31.3 g/dL (ref 30.0–36.0)
MCV: 94.3 fL (ref 80.0–100.0)
Monocytes Absolute: 0.3 10*3/uL (ref 0.1–1.0)
Monocytes Relative: 6 %
Neutro Abs: 3.8 10*3/uL (ref 1.7–7.7)
Neutrophils Relative %: 67 %
Platelet Count: 310 10*3/uL (ref 150–400)
RBC: 3.18 MIL/uL — ABNORMAL LOW (ref 3.87–5.11)
RDW: 14.4 % (ref 11.5–15.5)
WBC Count: 5.6 10*3/uL (ref 4.0–10.5)
nRBC: 0 % (ref 0.0–0.2)

## 2023-04-24 LAB — FOLATE: Folate: 4.8 ng/mL — ABNORMAL LOW (ref >5.9)

## 2023-04-24 LAB — VITAMIN B12: Vitamin B-12: 656 pg/mL (ref 180–914)

## 2023-04-24 LAB — IRON AND TIBC
Iron: 62 ug/dL (ref 28–170)
Saturation Ratios: 29 % (ref 10.4–31.8)
TIBC: 213 ug/dL — ABNORMAL LOW (ref 250–450)
UIBC: 151 ug/dL

## 2023-04-24 LAB — FERRITIN: Ferritin: 307 ng/mL (ref 11–307)

## 2023-04-24 MED ORDER — HEPARIN SOD (PORK) LOCK FLUSH 100 UNIT/ML IV SOLN
500.0000 [IU] | Freq: Once | INTRAVENOUS | Status: AC
  Administered 2023-04-24: 500 [IU] via INTRAVENOUS
  Filled 2023-04-24: qty 5

## 2023-04-24 MED ORDER — SODIUM CHLORIDE 0.9% FLUSH
10.0000 mL | Freq: Once | INTRAVENOUS | Status: AC
  Administered 2023-04-24: 10 mL via INTRAVENOUS
  Filled 2023-04-24: qty 10

## 2023-05-01 ENCOUNTER — Inpatient Hospital Stay: Payer: 59 | Admitting: Oncology

## 2023-05-01 ENCOUNTER — Inpatient Hospital Stay: Payer: 59

## 2023-05-01 ENCOUNTER — Encounter: Payer: Self-pay | Admitting: Oncology

## 2023-05-01 ENCOUNTER — Other Ambulatory Visit: Payer: Medicaid Other

## 2023-05-26 ENCOUNTER — Other Ambulatory Visit: Payer: Self-pay

## 2023-05-26 ENCOUNTER — Encounter: Payer: Self-pay | Admitting: Radiology

## 2023-05-26 ENCOUNTER — Emergency Department
Admission: EM | Admit: 2023-05-26 | Discharge: 2023-05-27 | Disposition: A | Discharge status: Home / Self Care | Payer: 59 | Attending: Emergency Medicine | Admitting: Emergency Medicine

## 2023-05-26 DIAGNOSIS — N184 Chronic kidney disease, stage 4 (severe): Secondary | ICD-10-CM | POA: Diagnosis not present

## 2023-05-26 DIAGNOSIS — I129 Hypertensive chronic kidney disease with stage 1 through stage 4 chronic kidney disease, or unspecified chronic kidney disease: Secondary | ICD-10-CM | POA: Diagnosis not present

## 2023-05-26 DIAGNOSIS — K2900 Acute gastritis without bleeding: Secondary | ICD-10-CM

## 2023-05-26 DIAGNOSIS — I10 Essential (primary) hypertension: Secondary | ICD-10-CM

## 2023-05-26 DIAGNOSIS — I130 Hypertensive heart and chronic kidney disease with heart failure and stage 1 through stage 4 chronic kidney disease, or unspecified chronic kidney disease: Secondary | ICD-10-CM | POA: Insufficient documentation

## 2023-05-26 DIAGNOSIS — E1122 Type 2 diabetes mellitus with diabetic chronic kidney disease: Secondary | ICD-10-CM | POA: Insufficient documentation

## 2023-05-26 DIAGNOSIS — I25118 Atherosclerotic heart disease of native coronary artery with other forms of angina pectoris: Secondary | ICD-10-CM

## 2023-05-26 DIAGNOSIS — I509 Heart failure, unspecified: Secondary | ICD-10-CM | POA: Diagnosis not present

## 2023-05-26 LAB — URINALYSIS, W/ REFLEX TO CULTURE (INFECTION SUSPECTED)
Bilirubin Urine: NEGATIVE
Glucose, UA: >=500 mg/dL — AB
Hgb urine dipstick: NEGATIVE
Ketones, ur: NEGATIVE mg/dL
Leukocytes,Ua: NEGATIVE
Nitrite: NEGATIVE
Protein, ur: >=300 mg/dL — AB
Specific Gravity, Urine: 1.016 (ref 1.005–1.030)
pH: 7 (ref 5.0–8.0)

## 2023-05-26 LAB — CBC WITH DIFFERENTIAL/PLATELET
Abs Immature Granulocytes: 0 10*3/uL (ref 0.00–0.07)
Basophils Absolute: 0 10*3/uL (ref 0.0–0.1)
Basophils Relative: 0 %
Eosinophils Absolute: 0.1 10*3/uL (ref 0.0–0.5)
Eosinophils Relative: 2 %
HCT: 33.6 % — ABNORMAL LOW (ref 36.0–46.0)
Hemoglobin: 10.7 g/dL — ABNORMAL LOW (ref 12.0–15.0)
Immature Granulocytes: 0 %
Lymphocytes Relative: 27 %
Lymphs Abs: 1.2 10*3/uL (ref 0.7–4.0)
MCH: 29.5 pg (ref 26.0–34.0)
MCHC: 31.8 g/dL (ref 30.0–36.0)
MCV: 92.6 fL (ref 80.0–100.0)
Monocytes Absolute: 0.3 10*3/uL (ref 0.1–1.0)
Monocytes Relative: 7 %
Neutro Abs: 2.9 10*3/uL (ref 1.7–7.7)
Neutrophils Relative %: 64 %
Platelets: 313 10*3/uL (ref 150–400)
RBC: 3.63 MIL/uL — ABNORMAL LOW (ref 3.87–5.11)
RDW: 15 % (ref 11.5–15.5)
WBC: 4.6 10*3/uL (ref 4.0–10.5)
nRBC: 0 % (ref 0.0–0.2)

## 2023-05-26 LAB — LIPID PANEL
Cholesterol: 268 mg/dL — ABNORMAL HIGH (ref 0–200)
HDL: 63 mg/dL (ref >40)
LDL Cholesterol: 175 mg/dL — ABNORMAL HIGH (ref 0–99)
Total CHOL/HDL Ratio: 4.3 {ratio}
Triglycerides: 151 mg/dL — ABNORMAL HIGH (ref <150)
VLDL: 30 mg/dL (ref 0–40)

## 2023-05-26 LAB — COMPREHENSIVE METABOLIC PANEL
ALT: 18 U/L (ref 0–44)
AST: 18 U/L (ref 15–41)
Albumin: 2.1 g/dL — ABNORMAL LOW (ref 3.5–5.0)
Alkaline Phosphatase: 102 U/L (ref 38–126)
Anion gap: 8 (ref 5–15)
BUN: 39 mg/dL — ABNORMAL HIGH (ref 8–23)
CO2: 18 mmol/L — ABNORMAL LOW (ref 22–32)
Calcium: 7.8 mg/dL — ABNORMAL LOW (ref 8.9–10.3)
Chloride: 112 mmol/L — ABNORMAL HIGH (ref 98–111)
Creatinine, Ser: 3.54 mg/dL — ABNORMAL HIGH (ref 0.44–1.00)
GFR, Estimated: 14 mL/min — ABNORMAL LOW (ref >60)
Glucose, Bld: 214 mg/dL — ABNORMAL HIGH (ref 70–99)
Potassium: 3.8 mmol/L (ref 3.5–5.1)
Sodium: 138 mmol/L (ref 135–145)
Total Bilirubin: 0.6 mg/dL (ref <1.2)
Total Protein: 5.3 g/dL — ABNORMAL LOW (ref 6.5–8.1)

## 2023-05-26 LAB — CREATININE, URINE, RANDOM: Creatinine, Urine: 96 mg/dL

## 2023-05-26 MED ORDER — SPIRONOLACTONE 25 MG PO TABS: 25.0000 mg | ORAL_TABLET | Freq: Every day | ORAL | 1 refills | Status: DC

## 2023-05-26 MED ORDER — PANTOPRAZOLE SODIUM 40 MG PO TBEC: 40.0000 mg | DELAYED_RELEASE_TABLET | Freq: Every day | ORAL | 1 refills | Status: DC

## 2023-05-26 MED ORDER — SPIRONOLACTONE 25 MG PO TABS
25.0000 mg | ORAL_TABLET | ORAL | Status: AC
  Administered 2023-05-26: 25 mg via ORAL
  Filled 2023-05-26: qty 1

## 2023-05-26 MED ORDER — ISOSORBIDE DINITRATE 30 MG PO TABS
30.0000 mg | ORAL_TABLET | ORAL | Status: AC
  Administered 2023-05-26: 30 mg via ORAL
  Filled 2023-05-26: qty 1

## 2023-05-26 MED ORDER — METOPROLOL SUCCINATE ER 50 MG PO TB24
50.0000 mg | ORAL_TABLET | ORAL | Status: AC
  Administered 2023-05-26: 50 mg via ORAL
  Filled 2023-05-26: qty 1

## 2023-05-26 MED ORDER — METOPROLOL SUCCINATE ER 50 MG PO TB24: 50.0000 mg | ORAL_TABLET | Freq: Every day | ORAL | 1 refills | Status: DC

## 2023-05-26 MED ORDER — DULOXETINE HCL 20 MG PO CPEP: 40.0000 mg | ORAL_CAPSULE | Freq: Two times a day (BID) | ORAL | 1 refills | Status: DC

## 2023-05-26 NOTE — ED Provider Notes (Signed)
[facility] Provider Note    Event Date/Time   First MD Initiated Contact with Patient 05/26/23 2218     (approximate)   History   Chief Complaint: Nausea and generalized swelling   HPI  [name] LEVITAN is a 67 y.o. female with a history of hypertension diabetes CKD CHF who comes ED complaining of left leg swelling that started 1 week ago.  She ran out of her spironolactone 1 week ago.  She is also noticed during this time that her blood pressure is trending higher.  She is prescribed metoprolol but does not take it.  She is also prescribed Protonix but does not take it.  She does report poor oral intake sometimes due to stomach upset with eating.  No fever chills chest pain or shortness of breath.  She also reports urinary frequency and dysuria.          Physical Exam   Triage Vital Signs: ED Triage Vitals  Encounter Vitals Group     BP 05/26/23 1731 (!) 178/118     Systolic BP Percentile --      Diastolic BP Percentile --      Pulse Rate 05/26/23 1731 95     Resp 05/26/23 1731 17     Temp 05/26/23 1734 98.2 F (36.8 C)     Temp Source 05/26/23 1734 Oral     SpO2 05/26/23 1731 100 %     Weight 05/26/23 1731 116 lb (52.6 kg)     Height 05/26/23 1731 5\' 5"  (1.651 m)     Head Circumference --      Peak Flow --      Pain Score 05/26/23 2203 5     Pain Loc --      Pain Education --      Exclude from Growth Chart --     Most recent vital signs: Vitals:   05/26/23 1734 05/26/23 2207  BP:  (!) 176/115  Pulse:  96  Resp:  19  Temp: 98.2 F (36.8 C)   SpO2:  100%   On my exam, blood pressure 185/120. General: Awake, no distress.  Calm, ambulatory CV:  Good peripheral perfusion.  Regular rate and rhythm, heart rate 95 Resp:  Normal effort.  Clear to auscultation bilaterally Abd:  No distention.  Soft with suprapubic tenderness Other:  1+ pitting edema left lower extremity.  Right lower extremity status post BKA.  No significant facial or  upper body edema.   ED Results / Procedures / Treatments   Labs (all labs ordered are listed, but only abnormal results are displayed) Labs Reviewed  CBC WITH DIFFERENTIAL/PLATELET - Abnormal; Notable for the following components:      Result Value   RBC 3.63 (*)    Hemoglobin 10.7 (*)    HCT 33.6 (*)    All other components within normal limits  COMPREHENSIVE METABOLIC PANEL - Abnormal; Notable for the following components:   Chloride 112 (*)    CO2 18 (*)    Glucose, Bld 214 (*)    BUN 39 (*)    Creatinine, Ser 3.54 (*)    Calcium 7.8 (*)    Total Protein 5.3 (*)    Albumin 2.1 (*)    GFR, Estimated 14 (*)    All other components within normal limits  LIPID PANEL - Abnormal; Notable for the following components:   Cholesterol 268 (*)    Triglycerides 151 (*)    LDL Cholesterol 175 (*)  All other components within normal limits  URINALYSIS, W/ REFLEX TO CULTURE (INFECTION SUSPECTED)  CREATININE, URINE, RANDOM     EKG    RADIOLOGY    PROCEDURES:  Procedures   MEDICATIONS ORDERED IN ED: Medications  isosorbide dinitrate (ISORDIL) tablet 30 mg (has no administration in time range)  metoprolol succinate (TOPROL-XL) 24 hr tablet 50 mg (50 mg Oral Given 05/26/23 2302)  spironolactone (ALDACTONE) tablet 25 mg (25 mg Oral Given 05/26/23 2302)     IMPRESSION / MDM / ASSESSMENT AND PLAN / ED COURSE  I reviewed the triage vital signs and the nursing notes.  DDx: AKI on CKD, electrolyte abnormality, anemia, hypoalbuminemia, medication noncompliance, UTI  Patient's presentation is most consistent with acute presentation with potential threat to life or bodily function.  Patient presents with increased blood pressure and lower extremity edema which appears to be due to medication noncompliance, out of metoprolol, spironolactone.  She is on 3 times daily isosorbide, but has not taking it in the past 12 hours.  Serum labs unremarkable, creatinine show CKD stable from  1 month ago.  Recommend she follow-up with her nephrologist given overall worsening from earlier in the year.  Will obtain UA to look for cystitis.  Doubt nephrotic syndrome or acute autoimmune/vasculitis disorder.  Refills sent to pharmacy for her spironolactone and metoprolol as well as Protonix and Cymbalta.       FINAL CLINICAL IMPRESSION(S) / ED DIAGNOSES   Final diagnoses:  Uncontrolled hypertension  Stage 4 chronic kidney disease (HCC)     Rx / DC Orders   ED Discharge Orders          Ordered    pantoprazole (PROTONIX) 40 MG tablet  Daily        05/26/23 2230    spironolactone (ALDACTONE) 25 MG tablet  Daily        05/26/23 2230    metoprolol succinate (TOPROL-XL) 50 MG 24 hr tablet  Daily        05/26/23 2230    DULoxetine (CYMBALTA) 20 MG capsule  2 times daily        05/26/23 2230             Note:  This document was prepared using Dragon voice recognition software and may include unintentional dictation errors.   Sharman Cheek, MD 05/26/23 [phone]

## 2023-05-26 NOTE — ED Triage Notes (Signed)
Pt states her left leg, arms and face started swelling about a weeks ago. Pt states she hasn't done anything out of the ordinary for this to happen. No issue with breathing.

## 2023-05-26 NOTE — ED Notes (Signed)
Pt sitting in lobby. Doesn't want to be kept separate.

## 2023-05-27 NOTE — ED Notes (Signed)
AVS provided by edp was discussed with pt. Pt verbalized understanding with no additional questions at this time.

## 2023-05-28 DIAGNOSIS — I5032 Chronic diastolic (congestive) heart failure: Secondary | ICD-10-CM | POA: Diagnosis not present

## 2023-05-28 DIAGNOSIS — C3490 Malignant neoplasm of unspecified part of unspecified bronchus or lung: Secondary | ICD-10-CM | POA: Diagnosis not present

## 2023-05-28 DIAGNOSIS — R001 Bradycardia, unspecified: Secondary | ICD-10-CM | POA: Diagnosis not present

## 2023-05-28 DIAGNOSIS — D125 Benign neoplasm of sigmoid colon: Secondary | ICD-10-CM | POA: Diagnosis not present

## 2023-05-28 DIAGNOSIS — I2510 Atherosclerotic heart disease of native coronary artery without angina pectoris: Secondary | ICD-10-CM | POA: Diagnosis not present

## 2023-05-28 DIAGNOSIS — K922 Gastrointestinal hemorrhage, unspecified: Secondary | ICD-10-CM | POA: Diagnosis not present

## 2023-05-28 DIAGNOSIS — R29818 Other symptoms and signs involving the nervous system: Secondary | ICD-10-CM | POA: Diagnosis not present

## 2023-05-28 DIAGNOSIS — N184 Chronic kidney disease, stage 4 (severe): Secondary | ICD-10-CM | POA: Diagnosis not present

## 2023-05-28 DIAGNOSIS — R471 Dysarthria and anarthria: Secondary | ICD-10-CM | POA: Diagnosis not present

## 2023-05-28 DIAGNOSIS — E1122 Type 2 diabetes mellitus with diabetic chronic kidney disease: Secondary | ICD-10-CM | POA: Diagnosis not present

## 2023-05-28 DIAGNOSIS — I509 Heart failure, unspecified: Secondary | ICD-10-CM | POA: Diagnosis not present

## 2023-05-28 DIAGNOSIS — N179 Acute kidney failure, unspecified: Secondary | ICD-10-CM | POA: Diagnosis not present

## 2023-05-28 DIAGNOSIS — T508X5A Adverse effect of diagnostic agents, initial encounter: Secondary | ICD-10-CM | POA: Diagnosis not present

## 2023-05-28 DIAGNOSIS — I6503 Occlusion and stenosis of bilateral vertebral arteries: Secondary | ICD-10-CM | POA: Diagnosis not present

## 2023-05-28 DIAGNOSIS — R918 Other nonspecific abnormal finding of lung field: Secondary | ICD-10-CM | POA: Diagnosis not present

## 2023-05-28 DIAGNOSIS — E872 Acidosis, unspecified: Secondary | ICD-10-CM | POA: Diagnosis not present

## 2023-05-28 DIAGNOSIS — E11649 Type 2 diabetes mellitus with hypoglycemia without coma: Secondary | ICD-10-CM | POA: Diagnosis not present

## 2023-05-28 DIAGNOSIS — I110 Hypertensive heart disease with heart failure: Secondary | ICD-10-CM | POA: Diagnosis not present

## 2023-05-28 DIAGNOSIS — G928 Other toxic encephalopathy: Secondary | ICD-10-CM | POA: Diagnosis not present

## 2023-05-28 DIAGNOSIS — Z9049 Acquired absence of other specified parts of digestive tract: Secondary | ICD-10-CM | POA: Diagnosis not present

## 2023-05-28 DIAGNOSIS — Z5982 Transportation insecurity: Secondary | ICD-10-CM | POA: Diagnosis not present

## 2023-05-28 DIAGNOSIS — K921 Melena: Secondary | ICD-10-CM | POA: Diagnosis not present

## 2023-05-28 DIAGNOSIS — E1165 Type 2 diabetes mellitus with hyperglycemia: Secondary | ICD-10-CM | POA: Diagnosis not present

## 2023-05-28 DIAGNOSIS — N189 Chronic kidney disease, unspecified: Secondary | ICD-10-CM | POA: Diagnosis not present

## 2023-05-28 DIAGNOSIS — D122 Benign neoplasm of ascending colon: Secondary | ICD-10-CM | POA: Diagnosis not present

## 2023-05-28 DIAGNOSIS — Z794 Long term (current) use of insulin: Secondary | ICD-10-CM | POA: Diagnosis not present

## 2023-05-28 DIAGNOSIS — N1832 Chronic kidney disease, stage 3b: Secondary | ICD-10-CM | POA: Diagnosis not present

## 2023-05-28 DIAGNOSIS — D509 Iron deficiency anemia, unspecified: Secondary | ICD-10-CM | POA: Diagnosis not present

## 2023-05-28 DIAGNOSIS — D123 Benign neoplasm of transverse colon: Secondary | ICD-10-CM | POA: Diagnosis not present

## 2023-05-28 DIAGNOSIS — D120 Benign neoplasm of cecum: Secondary | ICD-10-CM | POA: Diagnosis not present

## 2023-05-28 DIAGNOSIS — K5730 Diverticulosis of large intestine without perforation or abscess without bleeding: Secondary | ICD-10-CM | POA: Diagnosis not present

## 2023-05-28 DIAGNOSIS — E1151 Type 2 diabetes mellitus with diabetic peripheral angiopathy without gangrene: Secondary | ICD-10-CM | POA: Diagnosis not present

## 2023-05-28 DIAGNOSIS — I5023 Acute on chronic systolic (congestive) heart failure: Secondary | ICD-10-CM | POA: Diagnosis not present

## 2023-05-28 DIAGNOSIS — D62 Acute posthemorrhagic anemia: Secondary | ICD-10-CM | POA: Diagnosis not present

## 2023-05-28 DIAGNOSIS — Z89511 Acquired absence of right leg below knee: Secondary | ICD-10-CM | POA: Diagnosis not present

## 2023-05-28 DIAGNOSIS — M79601 Pain in right arm: Secondary | ICD-10-CM | POA: Diagnosis not present

## 2023-05-28 DIAGNOSIS — I6601 Occlusion and stenosis of right middle cerebral artery: Secondary | ICD-10-CM | POA: Diagnosis not present

## 2023-05-28 DIAGNOSIS — R55 Syncope and collapse: Secondary | ICD-10-CM | POA: Insufficient documentation

## 2023-05-28 DIAGNOSIS — I130 Hypertensive heart and chronic kidney disease with heart failure and stage 1 through stage 4 chronic kidney disease, or unspecified chronic kidney disease: Secondary | ICD-10-CM | POA: Diagnosis not present

## 2023-05-28 DIAGNOSIS — E162 Hypoglycemia, unspecified: Secondary | ICD-10-CM | POA: Diagnosis not present

## 2023-05-28 DIAGNOSIS — I6523 Occlusion and stenosis of bilateral carotid arteries: Secondary | ICD-10-CM | POA: Diagnosis not present

## 2023-05-28 DIAGNOSIS — J90 Pleural effusion, not elsewhere classified: Secondary | ICD-10-CM | POA: Diagnosis not present

## 2023-05-28 DIAGNOSIS — I82C11 Acute embolism and thrombosis of right internal jugular vein: Secondary | ICD-10-CM | POA: Diagnosis not present

## 2023-05-28 DIAGNOSIS — K640 First degree hemorrhoids: Secondary | ICD-10-CM | POA: Diagnosis not present

## 2023-05-28 DIAGNOSIS — E441 Mild protein-calorie malnutrition: Secondary | ICD-10-CM | POA: Diagnosis not present

## 2023-05-28 DIAGNOSIS — F17200 Nicotine dependence, unspecified, uncomplicated: Secondary | ICD-10-CM | POA: Diagnosis not present

## 2023-05-28 DIAGNOSIS — I82611 Acute embolism and thrombosis of superficial veins of right upper extremity: Secondary | ICD-10-CM | POA: Diagnosis not present

## 2023-05-28 DIAGNOSIS — I427 Cardiomyopathy due to drug and external agent: Secondary | ICD-10-CM | POA: Diagnosis not present

## 2023-05-28 DIAGNOSIS — I517 Cardiomegaly: Secondary | ICD-10-CM | POA: Diagnosis not present

## 2023-05-28 DIAGNOSIS — D631 Anemia in chronic kidney disease: Secondary | ICD-10-CM | POA: Diagnosis not present

## 2023-05-28 DIAGNOSIS — Z681 Body mass index (BMI) 19 or less, adult: Secondary | ICD-10-CM | POA: Diagnosis not present

## 2023-05-28 DIAGNOSIS — D124 Benign neoplasm of descending colon: Secondary | ICD-10-CM | POA: Diagnosis not present

## 2023-05-29 ENCOUNTER — Inpatient Hospital Stay: Payer: 59 | Attending: Oncology

## 2023-05-29 ENCOUNTER — Inpatient Hospital Stay: Payer: 59 | Admitting: Oncology

## 2023-05-29 ENCOUNTER — Encounter: Payer: Self-pay | Admitting: Oncology

## 2023-05-30 DIAGNOSIS — N049 Nephrotic syndrome with unspecified morphologic changes: Secondary | ICD-10-CM | POA: Insufficient documentation

## 2023-06-02 DIAGNOSIS — I82621 Acute embolism and thrombosis of deep veins of right upper extremity: Secondary | ICD-10-CM | POA: Insufficient documentation

## 2023-06-12 DIAGNOSIS — K922 Gastrointestinal hemorrhage, unspecified: Secondary | ICD-10-CM | POA: Insufficient documentation

## 2023-06-19 ENCOUNTER — Telehealth: Payer: Self-pay

## 2023-06-19 NOTE — Transitions of Care (Post Inpatient/ED Visit) (Signed)
   06/19/2023  Name: [name] [name] MRN: [id number] DOB: 1956-02-07  Today's TOC FU Call Status: Today's TOC FU Call Status:: Unsuccessful Call (1st Attempt) Unsuccessful Call (1st Attempt) Date: 06/19/23  Attempted to reach the patient regarding the most recent Inpatient/ED visit.  Follow Up Plan: Additional outreach attempts will be made to reach the patient to complete the Transitions of Care (Post Inpatient/ED visit) call.   Bari Mayans , BSN, RN Care Management Coordinator [place]   [place] [email or web address] Direct Dial: [phone]

## 2023-06-21 ENCOUNTER — Telehealth: Payer: Self-pay

## 2023-06-21 DIAGNOSIS — Z902 Acquired absence of lung [part of]: Secondary | ICD-10-CM | POA: Diagnosis not present

## 2023-06-21 DIAGNOSIS — N1832 Chronic kidney disease, stage 3b: Secondary | ICD-10-CM | POA: Diagnosis not present

## 2023-06-21 DIAGNOSIS — R059 Cough, unspecified: Secondary | ICD-10-CM | POA: Diagnosis not present

## 2023-06-21 DIAGNOSIS — J90 Pleural effusion, not elsewhere classified: Secondary | ICD-10-CM | POA: Diagnosis not present

## 2023-06-21 DIAGNOSIS — F17210 Nicotine dependence, cigarettes, uncomplicated: Secondary | ICD-10-CM | POA: Diagnosis not present

## 2023-06-21 DIAGNOSIS — Z681 Body mass index (BMI) 19 or less, adult: Secondary | ICD-10-CM | POA: Diagnosis not present

## 2023-06-21 DIAGNOSIS — I255 Ischemic cardiomyopathy: Secondary | ICD-10-CM | POA: Diagnosis not present

## 2023-06-21 DIAGNOSIS — E441 Mild protein-calorie malnutrition: Secondary | ICD-10-CM | POA: Diagnosis not present

## 2023-06-21 DIAGNOSIS — E872 Acidosis, unspecified: Secondary | ICD-10-CM | POA: Diagnosis not present

## 2023-06-21 DIAGNOSIS — D509 Iron deficiency anemia, unspecified: Secondary | ICD-10-CM | POA: Diagnosis not present

## 2023-06-21 DIAGNOSIS — Z888 Allergy status to other drugs, medicaments and biological substances status: Secondary | ICD-10-CM | POA: Diagnosis not present

## 2023-06-21 DIAGNOSIS — I110 Hypertensive heart disease with heart failure: Secondary | ICD-10-CM | POA: Diagnosis not present

## 2023-06-21 DIAGNOSIS — N049 Nephrotic syndrome with unspecified morphologic changes: Secondary | ICD-10-CM | POA: Diagnosis not present

## 2023-06-21 DIAGNOSIS — I5023 Acute on chronic systolic (congestive) heart failure: Secondary | ICD-10-CM | POA: Diagnosis not present

## 2023-06-21 DIAGNOSIS — Z5982 Transportation insecurity: Secondary | ICD-10-CM | POA: Diagnosis not present

## 2023-06-21 DIAGNOSIS — Z91148 Patient's other noncompliance with medication regimen for other reason: Secondary | ICD-10-CM | POA: Diagnosis not present

## 2023-06-21 DIAGNOSIS — Z91199 Patient's noncompliance with other medical treatment and regimen due to unspecified reason: Secondary | ICD-10-CM | POA: Diagnosis not present

## 2023-06-21 DIAGNOSIS — I5A Non-ischemic myocardial injury (non-traumatic): Secondary | ICD-10-CM | POA: Diagnosis not present

## 2023-06-21 DIAGNOSIS — I10 Essential (primary) hypertension: Secondary | ICD-10-CM | POA: Diagnosis not present

## 2023-06-21 DIAGNOSIS — Z89511 Acquired absence of right leg below knee: Secondary | ICD-10-CM | POA: Diagnosis not present

## 2023-06-21 DIAGNOSIS — I509 Heart failure, unspecified: Secondary | ICD-10-CM | POA: Diagnosis not present

## 2023-06-21 DIAGNOSIS — I130 Hypertensive heart and chronic kidney disease with heart failure and stage 1 through stage 4 chronic kidney disease, or unspecified chronic kidney disease: Secondary | ICD-10-CM | POA: Diagnosis not present

## 2023-06-21 DIAGNOSIS — F17200 Nicotine dependence, unspecified, uncomplicated: Secondary | ICD-10-CM | POA: Diagnosis not present

## 2023-06-21 DIAGNOSIS — E785 Hyperlipidemia, unspecified: Secondary | ICD-10-CM | POA: Diagnosis not present

## 2023-06-21 DIAGNOSIS — E538 Deficiency of other specified B group vitamins: Secondary | ICD-10-CM | POA: Diagnosis not present

## 2023-06-21 DIAGNOSIS — I2510 Atherosclerotic heart disease of native coronary artery without angina pectoris: Secondary | ICD-10-CM | POA: Diagnosis not present

## 2023-06-21 DIAGNOSIS — N179 Acute kidney failure, unspecified: Secondary | ICD-10-CM | POA: Diagnosis not present

## 2023-06-21 DIAGNOSIS — I428 Other cardiomyopathies: Secondary | ICD-10-CM | POA: Diagnosis not present

## 2023-06-21 DIAGNOSIS — D631 Anemia in chronic kidney disease: Secondary | ICD-10-CM | POA: Diagnosis not present

## 2023-06-21 DIAGNOSIS — E1122 Type 2 diabetes mellitus with diabetic chronic kidney disease: Secondary | ICD-10-CM | POA: Diagnosis not present

## 2023-06-21 DIAGNOSIS — Z86711 Personal history of pulmonary embolism: Secondary | ICD-10-CM | POA: Diagnosis not present

## 2023-06-21 DIAGNOSIS — E8809 Other disorders of plasma-protein metabolism, not elsewhere classified: Secondary | ICD-10-CM | POA: Diagnosis not present

## 2023-06-21 DIAGNOSIS — R0789 Other chest pain: Secondary | ICD-10-CM | POA: Diagnosis not present

## 2023-06-21 DIAGNOSIS — J9811 Atelectasis: Secondary | ICD-10-CM | POA: Diagnosis not present

## 2023-06-21 NOTE — Transitions of Care (Post Inpatient/ED Visit) (Signed)
   06/21/2023  Name: [name] [name] MRN: [id number] DOB: [birth year]  Today's TOC FU Call Status: Today's TOC FU Call Status:: Unsuccessful Call (2nd Attempt) Unsuccessful Call (1st Attempt) Date: 06/19/23 Unsuccessful Call (2nd Attempt) Date: 06/21/23  Attempted to reach the patient regarding the most recent Inpatient/ED visit.  Follow Up Plan: Additional outreach attempts will be made to reach the patient to complete the Transitions of Care (Post Inpatient/ED visit) call.    Bari Mayans , BSN, RN Care Management Coordinator [place]   [facility] [email or web address] Direct Dial: [phone]

## 2023-06-22 ENCOUNTER — Telehealth: Payer: Self-pay

## 2023-06-22 NOTE — Discharge Summary (Signed)
 [facility]                          Heart Failure Discharge Summary   Admit Date: 06/21/2023  Discharge Date: 06/25/2023  Admitting Physician: Garnette Norleen Pines, MD  Discharge Physician: Pines Garnette Norleen, MD  Primary Care Provider: Kandis Devaughn Sayres, MD  Primary Cardiologist: Establishing w/ Dr. Antonieta   Discharge Destination: Home  Discharge Services: none  Code Status: Full Code   Admission Diagnoses:  Other chest pain [R07.89] Acute exacerbation of chronic heart failure (CMS/HHS-HCC) [I50.9]  Discharge Diagnoses:  Principal Problem:   Acute on chronic systolic CHF (congestive heart failure) (CMS/HHS-HCC) Active Problems:   Coronary atherosclerosis of native coronary artery   Non-small cell lung cancer (CMS/HHS-HCC)   Primary hypertension   Stage 3b chronic kidney disease (CMS/HHS-HCC)   Type 2 diabetes mellitus (CMS/HHS-HCC) Resolved Problems:   * No resolved hospital problems. *     Anticipatory Guidance (key med changes, results pending, future labs, IV therapies): Presented with acute-on-chronic HFrEF in the setting of medication nonadherence. IV diuresed and re-started prior d/c regimen. Patient provided with all of her medications prior to discharge. - Resumed torsemide 80mg  BID - Resumed carvedilol and hydralizine/Isordil . Further GDMT limited by renal function. - Resumed amlodipine  Please check BMP at f/u to reassess renal function  - EDW: 52 kg     Cardiac Rehab: ordered  Patient Discharge Instructions:   Ambulatory Referral to Cardiac Rehab  Referral Priority: Routine Referral Type: Consultation  Number of Visits Requested: 1   If you smoke (or have smoked within the last year), we strongly recommend that you do not smoke.   Weigh yourself daily and record   Notify cardiology provider of chest pain   Notify provider of swelling in arms, legs, or stomach   Notify provider temperature greater  than 101.0 F (38.3 C) degrees   Notify provider of weight gain greater than 2 lbs in 1 day or 5 lbs in 1 week   Report questions or concerns to the [facility] at [phone]   Notify primary care physician of other symptoms   For a life-threatening emergency, call 911   Follow-up with Primary Care Provider   Follow-up with Cardiology   Notify provider of nausea or vomiting   Notify provider of dizziness or passing out   Notify provider of difficulty breathing or shortness of breath   Normal activity   2-gram sodium     [facility] Provider Follow-up: Future Appointments  Date Time Provider Department Center  07/02/2023  1:50 PM McVeigh, Krystal Sharper, PA 59F/2G CARD [facility]  07/25/2023 10:00 AM Antonieta Darby, MD 61F/2G CARD [facility]    Non-[facility] Provider Follow-up: none  Report Issues: By using [facility], or by calling the [facility] at [phone].  For urgent issues or after business hours (after 5pm on weekdays and anytime on weekends), call the [facility] Operator [phone] and ask to page the on-call cardiologist.     Allergies/Intolerances:  Allergies  Allergen Reactions  . Lisinopril Swelling     Medications:     Discharge Medications     New Medications      Details  amLODIPine 10 MG tablet Commonly known as: NORVASC  10 mg, Oral, Daily Quantity: 30 tablet Refills: 1   aspirin   81 MG chewable tablet  81 mg, Oral, Daily Quantity: 30 tablet Refills: 1   atorvastatin  20 MG tablet Commonly known as: LIPITOR   20 mg, Oral, Daily Quantity: 30 tablet Refills: 1   carvediloL 25 MG tablet Commonly known as: COREG  25 mg, Oral, 2 times Daily with meals Quantity: 60 tablet Refills: 1   ergocalciferol (vitamin D2) 1,250 mcg (50,000 unit) capsule  50,000 Units, Oral, Weekly Quantity: 8 capsule Refills: 0   folic acid  1 MG tablet Commonly known as: FOLVITE   1 mg, Oral, Daily Quantity: 30 tablet Refills: 3   HumaLOG   KwikPen Insulin  pen injector (concentration 100 units/mL) Generic drug: insulin  LISPRO  Inject 6 units at breakfast, 5 units at lunch, 5 units at dinner Quantity: 3 mL Refills: 0   hydrALAZINE  100 MG tablet Commonly known as: APRESOLINE   100 mg, Oral, 3 times Daily Quantity: 90 tablet Refills: 1   isosorbide  dinitrate 20 MG tablet Commonly known as: ISORDIL   40 mg, Oral, 3 times Daily Quantity: 180 tablet Refills: 1   pantoprazole  40 MG DR tablet Commonly known as: PROTONIX   40 mg, Oral, 2 times Daily before meals Quantity: 60 tablet Refills: 1   sodium bicarbonate 650 MG tablet  650 mg, Oral, 3 times Daily Quantity: 90 tablet Refills: 1   TORsemide 20 MG tablet Commonly known as: DEMADEX  80 mg, Oral, 2 times Daily Quantity: 240 tablet Refills: 1   TRESIBA FLEXTOUCH U-100 pen injector (concentration 100 units/mL) Generic drug: insulin  DEGLUDEC  7 Units, Subcutaneous, Daily Quantity: 3 mL Refills: 0       Medications To Continue      Details  melatonin 3 mg tablet  3 mg, Oral, Nightly Quantity: 30 tablet Refills: 0        ARNI/ACE/ARB: Contraindicated due to renal insufficiency/failure SGLT2i: Contraindicated due to: eGFR < 20 ml/min or ESRD on dialysis  Beta Blocker: Prescribed MRA: Contraindicated due to renal insufficiency/failure Hydralazine /Nitrates: Prescribed    Brief History of Present Illness: Per the H&P dated on 06/21/2023:   [name] [name] is a 68 y.o. female with PMH significant for HFrEF, CAD (known CTO of RCA), former cocaine and tobacco use, mixed ischemic and NICM, PVD, HTN, type 2 DM, NSCLC (stage IIIa) s/p LL lobectomy and LN dissection, R BKA 2021, CKD stage 3b, CVA, chronic anemia, pulmonary embolism who presents with shortness of breath.   Per ED consult note:    Recently admitted 12/9-12/30 for CHF exacerbation. However on closer evaluation,  intravascularly euvolemic with extravascular fluid likely third spacing in the setting  of low albumin (2.0). Echo with with EF 25%, global dysfunction, severe RV dysfunction and moderate TR. Etiology of CM likely cocaine use. Deferred ischemic w/u in setting of AKI. Hospitalization complicated by GIB s/p colonoscopy 12/26 revealing multiple polyps s/p polypectomy, multiple tubular adenomas. GI recommended 3 years CRC surveillance.    Discharged on new torsemide 80mg  BID, amlodipine, and coreg. Spironolactone  was stopped due to kidney function and apixaban  was stopped for GIB. EDW 53kg. After discharge, cardiology called patient 12/31 where she reported that she would pick up her medications that day. Unfortunately, pt was unable to pick up her medications that day and has gone ~3-4 days without diuretics.    Now presents with SOB and orthopnea in setting of missing home diuretics.  On presentation to the ED, vitals were HR 92, SBP 154/ DBP 96, RR 16, and satting 86% on RA. Labs notable for Na 142, K 4.0, HCO3 23,  BUN 41, sCr 3.1., Albumin 1.9. CBC notable for WBC 7.9, Hgb 8.3, plts 377. ECG with SR. hsTnI 52--> 52 delta 0. Pro- BNP [id number].(Previously 131,060 12/9).  CXR showed new R pleural effusion. COVID/Flu A/B negative. Received low dose IV Lasix  20 IV while labs were pending. Additional Lasix  IV 60 x 1 to be given. Cardiology was consulted for further evaluation.   _____________________  Hospital Course by Problem:  # Acute on chronic heart failure with reduced ejection fraction # ICM # Multivessel CAD # HTN # HLD # Peripheral vascular disease  Presented with volume overload and HTN in the setting of medication nonadherence after recent discharge for decompensated HFrEF. After discharge, she did not obtain her medications and quickly started re-accumulating fluid. On arrival at [facility], pBNP 90k and CXR with bilateral pleural effusions.She was IV diuresed with eventual transition to oral agents. GDMT was re-introduced. Her discharge regimen includes BB, and hydralazine  + norvasc to  manage her BP. This was delivered to her room on day of discharge.  In terms of the etiology of her heart failure, she has MVCAD seen on 2021 LHC at which time medical management was pursued. She had GIB and multiple polypectomies during recent admission, making her a poor candidate for recurrent ischemic evaluation and of note her flat troponin ruled out ACS this admission. She additionally reports frequent cocaine use, though aiming for abstinence as below. Suspect mixed ischemic and nonischemic cardiomyopathy. Most recent TTE showed EF 25% (previously had recovered EF in 2022, from EF 20% in 2021 at time of HF diagnosis). LHC 2021 revealing CTO of RCA with collaterals, 50% mLAD stenosis and 80% OM1; opted for medical management.    # AKI  # CKD stage 3 # Nephrotic syndrome  # Metabolic acidosis Baseline Cr unclear with recent values ~3.4. Cr 3.1 on admission in setting of hypervolemia, Cr 3.8 on day of discharge. Pt started on sodium bicarb inpt and continued on d/c.   # Type 2 diabetes mellitus Last admission she was seen for AMS secondary to hypoglycemia. A1c 8.0%. Metformin  was discontinued with eGFR. Endocrinology was consulted that admission and recommended the regimen with which she will be discharged on as above; provided insulin  pens on d/c.   # Substance use disorder UDS negative on arrival. Patient requested placement in sober living and social work was consulted, but patient not agreeable to terms of sober living.    # Anemia # IDA # Vitamin B12 deficiency  Likely combination of IDA, B12 deficiency and CKD. Baseline Hgb ~9.0, remained stable this admission with Hgb 8.6 on day of discharge. Continued home folate.   # Medical nonadherence Pt unable to pickup medications during last discharge due to transportation barrier. She was enrolled in meds to beds, with medications delivered on day of discharge.  Abnormal troponin: She had an abnormal high-sensitivity troponin panel. Based  on my review of all clinical and laboratory information, the abnormal troponin most likely reflects myocardial injury secondary to another medical condition (ICD-10 code I5A).    Imaging and Procedures Performed:   CXR 1/2 Right port catheter is unchanged.   Enlarged right pleural effusion. Similar left pleural effusion.   Bibasilar atelectasis. No new focal opacity or consolidation.   No pneumothorax.   Cardiac and mediastinal contours are unchanged.   _____________________  Discharge Exam:  Admission Weight: 54.1 kg (119 lb 4.3 oz)  Discharge Weight: Weight: 52.5 kg (115 lb 11.2 oz) BMI: Body mass index is 19.25 kg/m. BP (!) 145/75 (  BP Location: Left upper arm, Patient Position: Sitting)   Pulse 69   Temp 36.7 C (98.1 F) (Oral)   Resp 18   Ht 165.1 cm (5' 5)   Wt 52.5 kg (115 lb 11.2 oz)   SpO2 99%   BMI 19.25 kg/m   General: alert, cooperative, and in NAD Respiratory: regular rate, symmetric, unlabored, clear to auscultation bilaterally, and no accessory muscle use Cardiac: regular rate, regular rhythm, S1, S2 present, no murmur, no rub, no gallop, and JVD non-elevated Abdomen: normal bowel sounds, soft, nontender, and nondistended Extremities: extremities warm and well perfused, no clubbing or cyanosis, distal pulses intact, and edema 1-2+ BLE Lines: Non accessed R chest port  Pertinent Lab Testing:  BMP: Recent Labs  Lab 06/25/23 0535  NA 140  K 3.6  CL 107  CO2 22  BUN 53*  CREATININE 3.8*  GLUCOSE 151*  CALCIUM  7.7*  MG 2.0   CBC: Recent Labs  Lab 06/25/23 0535  WBC 5.8  HGB 8.6*  HCT 27.1*  PLT 306   INR: No results for input(s): INR in the last 168 hours.  TFTs: No results for input(s): TSH, T4FREE in the last 168 hours.       Other Pertinent Labs: None  _____________________  Time spent on discharge process:  >30 minutes  JOHN PHILLIP IVEN,  PA    ------------------------------------------------------------------------------- Attestation signed by Levora Garnette Rush, MD at 06/25/2023  4:40 PM  Attending Attestation:   Alvis well. Kidney function about same as last discharge. Doing meds to beds to try to better guarantee access to meds. Volume status looks Software engineer:   I personally saw the patient and performed a substantive portion of the medical decision making, in conjunction with the Advanced Practice Provider for the condition/treatment of Ms Destenee Guerry RUSH LEVORA, MD  -------------------------------------------------------------------------------

## 2023-06-22 NOTE — Transitions of Care (Post Inpatient/ED Visit) (Signed)
   06/22/2023  Name: [name] [name] MRN: [id number] DOB: 04-23-1956  Today's TOC FU Call Status: Today's TOC FU Call Status:: Unsuccessful Call (3rd Attempt) Unsuccessful Call (1st Attempt) Date: 06/19/23 Unsuccessful Call (2nd Attempt) Date: 06/21/23 Unsuccessful Call (3rd Attempt) Date: 06/22/23 Patient's Name and Date of Birth confirmed.  Transition Care Management Follow-up Telephone Call Date of Discharge: 06/18/23 Discharge Facility: Other ([facility]) Name of [facility] ([facility]) [facility]charge Facility: [facility] Type of Discharge: Inpatient Admission Primary Inpatient Discharge Diagnosis:: CHF  Patient was readmitted to hospital last night 06/21/23 CHF . Will follow-up after discharge from this stay      Bari Mayans , BSN, RN Care Management Coordinator [facility]   [facility] [email or web address] Direct Dial: [phone]

## 2023-06-23 DIAGNOSIS — R0789 Other chest pain: Secondary | ICD-10-CM | POA: Diagnosis not present

## 2023-06-23 NOTE — Initial Assessments (Signed)
 [facility]  Physical Therapy Initial Evaluation  Patient Name:  [name] [name] Date of Evaluation: 06/23/23 Time of Evaluation:  1126 Duration of Session:  23 Minutes Room/Bed: 7315/7315-01  Precautions: Falls Risk, Prosthesis      Prosthesis: BKA prosthesis (right)   Assessment: Pt tolerated therapy session well. Remains near her independent baseline function. Ambulated 364ft without an assistive device. However, she did become more fatigued towards the end of the session, resulting in reliance on hallway rails for added support. Patient also reported she does not feel like her prosthesis is fitting properly. This may also be contributing to fatigue due to increased energy expenditure/poor gait biomechanics due to compensating. She is safe to return. PT recommends using a rolling Phimmasone during prolonged ambulation and to see an OP prosthetist to improve fit of prosthetic. PT will sign off at this time due to no acute PT needs. Please re-consult should changes in functional status arise while in house.    Recommendations for mobility with nursing:  Use BMAT score and associated clinical judgement to determine safe mobility on a daily basis as patient status may be subject to change.  Safe to ambulate in the room independently. Recommend supervision for hallway ambulation. Encourage to ambulate 3x per day.   Discharge Recommendations: Is the patient safe to discharge to the recommended disposition? Yes Discharge Recommendations: Home (OP prosthesis clinic as patient reports her prosthetic is not fitting properly) DME Recommendations    Flowsheet Row Most Recent Value  DME Recommendations None Filed at 06/23/2023 1126       Complete details of today's session: Chart reviewed. RN cleared for therapy. Details of today's session can be found in the flowsheet below. In summary, the patient was found semi-reclined in bed and agreeable to therapy. OT treating alongside for  safety and session optimization. At end of session, the patient was left seated at edge of bed, with all needs in reach, with nurse call device in reach. Her status was communicated to the RN, Patient.   Reason for Admission: [name] [name] is a 68 y.o. female admitted on 06/21/2023 with PMH significant for HFrEF, CAD (known CTO of RCA), former cocaine and tobacco use, mixed ischemic and NICM, PVD, HTN, type 2 DM, NSCLC (stage IIIa) s/p LL lobectomy and LN dissection, R BKA 2021, CKD stage 3b, CVA, chronic anemia, pulmonary embolism who presents with shortness of breath.   Per ED consult note:    Recently admitted 12/9-12/30 for CHF exacerbation. However on closer evaluation,  intravascularly euvolemic with extravascular fluid likely third spacing in the setting of low albumin (2.0). Echo with with EF 25%, global dysfunction, severe RV dysfunction and moderate TR. Etiology of CM likely cocaine use. Deferred ischemic w/u in setting of AKI. Hospitalization complicated by GIB s/p colonoscopy 12/26 revealing multiple polyps s/p polypectomy, multiple tubular adenomas. GI recommended 3 years CRC surveillance.    Discharged on new torsemide  80mg  BID, amlodipine, and coreg . Spironolactone  was stopped due to kidney function and apixaban  was stopped for GIB. EDW 53kg. After discharge, cardiology called patient 12/31 where she reported that she would pick up her medications that day. Unfortunately, pt was unable to pick up her medications that day and has gone ~3-4 days without diuretics.    Now presents with SOB and orthopnea in setting of missing home diuretics.  On presentation to the ED, vitals were HR 92, SBP 154/ DBP 96, RR 16, and satting 86% on RA. Labs notable for Na 142, K  4.0, HCO3 23,  BUN 41, sCr 3.1., Albumin 1.9. CBC notable for WBC 7.9, Hgb 8.3, plts 377. ECG with SR. hsTnI 52--> 52 delta 0. Pro- BNP [id number].(Previously 131,060 12/9).  CXR showed new R pleural effusion. COVID/Flu A/B negative. Received  low dose IV Lasix  20 IV while labs were pending. Additional Lasix  IV 60 x 1 to be given. Cardiology was consulted for further evaluation.  She has a past medical history of Diabetes mellitus without complication (CMS/HHS-HCC), Hyperlipidemia, and Hypertension.     06/23/23 1126  Discipline Timestamp  Discipline Timestamp PT  Documentation Type     Documentation Type                                E,R, T   Initial Assessment  Patient Subjective Information  Patient Subjective Information Patient agreeable to therapy  Patient Profile/Social History  Social history source                                Patient  Patient lives                           with family  Admitted from Home  Number of falls in the last 3 months 0  Prior Level of Function Independent community mobility without an assistive device  Home Activity / Exercise No  Assistance received prior to admission No  Assistance available after discharge Family;As needed  Home Environment  Type of Home House  Home Layout One level  Access Exterior stairs       Exterior stairs - number of steps 4       Exterior stairs - rails Both sides  Bathroom Shower/Tub Tub/shower unit  [employer] Standard  Home Equipment/DME Available For Use  Mobility aid Defrancesco;Wheelchair       Nutritional Therapist type Manual  Precautions  Precautions Falls Risk;Prosthesis       Prosthesis BKA prosthesis (right)  Patient/Family Goals  Patient/Family Goals Take care of myself;Return to previous lifestyle  Vital Signs  Heart Rate 67  Heart Rate Source Monitor  BP 97/59 (114/57 (74) post ambulation)  MAP (mmHg) 71  BP Location Left upper arm  BP Method Automatic  Patient Position Sitting  Pain Assessment  Pain Assessment %% No/denies pain  Pain Score %% Zero  Review of Systems Cardiovascular/Pulmonary Function  SpO2 94 %  Any supplemental oxygen? No  Review of Systems - Cognition                         Overall  Cognitive Status WNL  Behavior Cooperative  Safety Judgment Fully aware of deficits  Communication WNL  Mobility  Bed Mobility  Supine to Sit;Sit to Supine;Sit to Stand;Stand to Sit       Supine to Sit Assistance Modified independent       Supine to Sit Details Head of bed elevated       Number of People Required 1       Sit to Supine Assistance Modified independent       Sit to Supine Details Head of bed elevated       Number of People Required 1       Sit to Stand Assistance Supervision  Sit to Stand Details Requires extra time;Set up       Number of People Required 1       Stand to Sit Assistance Supervision       Stand to Sit Details Requires extra time       Number of People Required 1  Ambulation Yes       Ambulation Assistance Stand by assist       Number of People Required 1  Distance ambulated in feet 300 feet  Ambulation Assistive Device None  Gait Pattern Stance Phase Lateral trunk shift, left  Stairs/Curb assessed No  Adult PT Outcomes  Highest Level of Function Yes  Highest Level of Activity 9- Walking A x 1  Time in Highest Level of Activity 6-10 min  Adverse Events 0- None  Inpatient AM-PAC Performed Basic Mobility Inpatient Short Form - 6 Clicks  AM-PAC 6 Clicks Basic Mobility Inpatient Short Form  Turning from your back to your side while in a flat bed without using bedrails? 4-None  Moving from lying on your back to sitting on the side of a flat bed without using bedrails? 4-None  Moving to and from a bed to a chair (including a wheelchair)? 4-None  Standing up from a chair using using your arms (e.g,. wheelchair, or bedside chair)? 4-None  To walk in hospital room? 3-A Little  Climbing 3-5 steps with a railing? 3-A Little  AM-PAC Basic Mobility Raw Score 22  AM-PAC Basic Mobility t-Scale Score 47.4  AM-PAC Basic Mobility G-Code Modifier CJ  Patient Status At End of Session  Status Communicated to: RN;Patient  Pt Left: seated at edge of bed;with all  needs in reach;with nurse call device in reach  Assessment  Impairments/Functional Limitations    None identified  Rehab Potential   Good  Patient safe for DC/PT perspective? Yes  Summary of Findings  No acute PT goals identified;Safe to return home  Plan  Treatment/Interventions None indicated  PT Frequency Evaluation only;Patient discharged from PT  Discharge Recommendation (DUH/DRH) Home (OP prosthesis clinic as patient reports her prosthetic is not fitting properly)  DME Recommendations None  Plan(Progress Note) Discontinue physical therapy       PT Goals     * PT Goals (Active)     There are no active problems.             * PT Goals (Resolved)     There are no resolved problems.             Please see patient education record for PT teaching completed today.   KYLEIGH NELSON, PT   *Some images could not be shown.

## 2023-06-23 NOTE — Progress Notes (Signed)
 Cardiology Progress Note    06/23/2023 Hospital Day: 3  [name] [name] is a 68 y.o.female who was admitted on 06/21/2023 with acute-on-chronic HFrEF.   Subjective:   Ms. Sicard remains off O2, overall feeling much better. No CP or SOB overnight.  Objective:   Vital signs in last 24 hours: Temp:  [36.7 C (98 F)-36.8 C (98.2 F)] 36.7 C (98 F) Heart Rate:  [65-70] 65 Resp:  [18] 18 BP: (97-133)/(58-87) 114/69 SpO2: 94 % Last BM Date: 06/21/23  Weights: Last weight: 51.3 kg (113 lb)    First weight: 54.1 kg (119 lb 4.3 oz) BMI: Body mass index is 18.8 kg/m.  24-Hour Intake/Output: I/O last 2 completed shifts: In: 1320 [P.O.:1320] Out: 2450 [Urine:2450]  Telemetry: SR   VAD:  No  Physical Exam:  General: alert, cooperative, and in NAD Respiratory: regular rate, symmetric, unlabored, clear to auscultation bilaterally, and no accessory muscle use Cardiac: regular rate, regular rhythm, S1, S2 present, no murmur, no rub, no gallop, and JVD elevated mid-neck Abdomen: soft, nontender, and nondistended Extremities: s/p R BKA; LLE with 2+ edema to mid-shin Lines: PIV  Labs:  BMP: Recent Labs  Lab 06/23/23 0437  NA 137  K 3.8  CL 104  CO2 23  BUN 51*  CREATININE 3.8*  GLUCOSE 96  CALCIUM  7.7*  MG 2.1   CBC: Recent Labs  Lab 06/23/23 0437  WBC 5.3  HGB 7.9*  HCT 25.1*  PLT 308   INR: No results for input(s): INR in the last 168 hours.  FK: No results for input(s): FK506 in the last 168 hours. CYA: No results for input(s): CYA in the last 73719 hours. LDH: No results for input(s): LDH in the last 168 hours.       Scheduled Medications: .  amLODIPine, 10 mg, Oral, Daily .  aspirin , 81 mg, Oral, Daily .  atorvastatin , 20 mg, Oral, Daily .  carvediloL , 25 mg, Oral, BID CC .  dextrose  50% in water, 12.5-25 g, Intravenous, As Directed .  folic acid , 1 mg, Oral, Daily .  glucagon, 1 mg, Intramuscular, As Directed .  hydrALAZINE , 100 mg,  Oral, TID .  insulin  GLARGINE-yfgn (SEMGLEE ) injection, 3 Units, Subcutaneous, QHS .  insulin  LISPRO (AdmeLOG , HumaLOG ) injection, 3 Units, Subcutaneous, Daily with breakfast .  insulin  LISPRO (AdmeLOG , HumaLOG ) injection, 3 Units, Subcutaneous, Daily with lunch .  insulin  LISPRO (AdmeLOG , HumaLOG ) injection, 3 Units, Subcutaneous, Daily with dinner .  insulin  REGULAR (HumuLIN R , NovoLIN R) injection, 0-8 Units, Subcutaneous, TID AC .  isosorbide  dinitrate, 40 mg, Oral, TID .  lidocaine , 0.5 mL, Subcutaneous, As Directed .  melatonin, 3 mg, Oral, QHS .  ondansetron , 4 mg, Intravenous, Q12H PRN .  pantoprazole , 40 mg, Oral, BID AC .  sodium bicarbonate, 650 mg, Oral, TID .  [Held by provider] TORsemide , 80 mg, Oral, BID .  TORsemide , 80 mg, Oral, Once  Assessment/Plan:   Principal Problem:   Acute on chronic systolic CHF (congestive heart failure) (CMS/HHS-HCC) Active Problems:   Coronary atherosclerosis of native coronary artery   Non-small cell lung cancer (CMS/HHS-HCC)   Primary hypertension   Stage 3b chronic kidney disease (CMS/HHS-HCC)   Type 2 diabetes mellitus (CMS/HHS-HCC)   # Acute on chronic systolic HF (EF 25%) # ICM # Multivessel CAD # HTN # HLD # Peripheral vascular disease  Presented with volume overload and HTN in the setting of medication nonadherence after recent discharge. pBNP 90k and CXR with bilateral pleural effusions. Diagnosed  with HFrEF in 2021 when TTE showed EF 20-25%. Underwent work-up with [facility] 2021 revealing CTO of RCA with collaterals, 50% mLAD stenosis and 80% OM1, no interventions done at the time. Medically managed with later TTE in 2022 showing recovered EF. Last TTE 12/11 showed EF 25%, global dysfunction, severe RV dysfunction and moderate TR. - With slight increase in Cr plan to switch to oral diuretic today with torsemide  80mg  x1, anticipate resuming home regimen (torsemide  80mg  BID) on 1/5 pending renal function - Continue home BP regimen:  amlodipine, carvedilol , hydralazine , Imdur - Will consider starting spironolactone  1/5 pending renal function and BP   # AKI  # CKD stage 3 # Nephrotic syndrome  # Metabolic acidosis Baseline Cr unclear, last outpatient Cr was 3.36 in November, GFR 15. Cr 3.7 at discharge 12/30, now improved to 3.1 on admission in setting of hypervolemia. - Continue sodium bicarb  - Cr 3.8 today, diuresis as above - CTM with daily BMP   # Type 2 diabetes mellitus Last admission she was seen for AMS secondary to hypoglycemia. A1c 8.0%. Metformin  was discontinued with eGFR. Endocrinology was consulted that admission and recommended the following: resume Tresiba 7units DAILY ONLY and Novolog  6 units with breakfast, 5 units with lunch, and 5 units with dinner. No Jardiance  given poor PO intake and risk for DKA. Would benefit from a Dexcom or Libre sensor- sent to [facility] and enrolled in Meds to [place]. Follow up with her [facility] Endocrinologist.  - Continue glargine 3u daily and lispro 3u TID CC with meals with SSI   # Substance use disorder UDS negative on arrival. Patient requesting placement in sober living facility; but not amenable to restrictions imposed by sober living facilities. Plan to return to cousin's house. - Appreciate substance use team assistance   # Anemia # IDA # Vitamin B12 deficiency  Likely combination of IDA, B12 deficiecny and CKD related anemia. Baseline Hgb 8.7-9.5. - Continue folate    # Protein calorie Malnutrition # Hypoalbunemia  She has been diagnosed with mild protein-calorie malnutrition in the context of chronic illness, based on energy intake meeting < or equal to 75% of estimated requirement for > or equal to 1 month.  - Recommend oral nutrition.   # Dispo planning  Pt unable to pickup medications during last discharge due to lack of ride.  - Plan for Meds to Meds delivery at discharge   Code Status: Full Code VTE Prophylaxis: SCDs (sequential compression  device) Discharge Planning: pending clinical course   [name], PA  ------------------------------------------------------------------------------- Attestation signed by Levora Garnette Rush, MD at 06/23/2023  2:24 PM  Attending Attestation:   69 yo F w ICM/HFrEF 25%, PAD w R BKA, stage 3 CKD, presented with worsening dyspnea and volume overload.    Recent discharge on 12/30 for acute HF. A that time cocaine + (negative on current admission). At that time, noted that EF had formerly been recovered. Angiogram deferred given CKD. Also, had significant GIB and many polyps removed that make hunting for ischemia further unappealing.    Good response to IV lasix  and metolazone  yesterday.  Creatinine up a little more today and looks more optivolemic like was at last discharge. Back to PO torsemide  and working on discharge planning. Will need meds to beds.    Attestation Statement:   I personally saw the patient and performed a substantive portion of the medical decision making, in conjunction with the Advanced Practice Provider for the condition/treatment of Ms Fabio.  STEPHEN NORLEEN PINES, MD  -------------------------------------------------------------------------------

## 2023-06-24 DIAGNOSIS — R0789 Other chest pain: Secondary | ICD-10-CM | POA: Diagnosis not present

## 2023-06-24 NOTE — Progress Notes (Signed)
 Cardiology Progress Note    06/24/2023 Hospital Day: 4  [name] [name] is a 68 y.o.female who was admitted on 06/21/2023 with acute-on-chronic HFrEF.   Subjective:   [name] [name] feeling well this morning. Breathing is back to baseline, but still has some edema in left leg.  Objective:   Vital signs in last 24 hours: Temp:  [36.4 C (97.5 F)-36.8 C (98.2 F)] 36.4 C (97.5 F) Heart Rate:  [62-72] 62 Resp:  [18-20] 19 BP: (104-130)/(60-83) 124/83 SpO2: 100 % Last BM Date: 06/23/23  Weights: Last weight: 51 kg (112 lb 6.4 oz)    First weight: 54.1 kg (119 lb 4.3 oz) BMI: Body mass index is 18.7 kg/m.  24-Hour Intake/Output: I/O last 2 completed shifts: In: 780 [P.O.:780] Out: 2950 [Urine:2950]  Telemetry: SR   VAD:  No  Physical Exam:  General: alert, cooperative, and in NAD Respiratory: regular rate, symmetric, unlabored, clear to auscultation bilaterally, and no accessory muscle use Cardiac: regular rate, regular rhythm, S1, S2 present, no murmur, no rub, no gallop, and JVD at low neck while sitting upright Abdomen: soft, nontender, and nondistended Extremities: s/p R BKA; LLE with 2+ edema to mid-shin Lines: PIV  Labs:  BMP: Recent Labs  Lab 06/24/23 0358  NA 138  K 4.3  CL 104  CO2 22  BUN 53*  CREATININE 3.8*  GLUCOSE 188*  CALCIUM  7.5*  MG 2.1   CBC: Recent Labs  Lab 06/24/23 0358  WBC 5.3  HGB 7.7*  HCT 24.6*  PLT 283   INR: No results for input(s): INR in the last 168 hours.  FK: No results for input(s): FK506 in the last 168 hours. CYA: No results for input(s): CYA in the last 73719 hours. LDH: No results for input(s): LDH in the last 168 hours.       Scheduled Medications: .  amLODIPine, 10 mg, Oral, Daily .  aspirin , 81 mg, Oral, Daily .  atorvastatin , 20 mg, Oral, Daily .  carvediloL , 25 mg, Oral, BID CC .  dextrose  50% in water, 12.5-25 g, Intravenous, As Directed .  folic acid , 1 mg, Oral, Daily .  glucagon, 1 mg,  Intramuscular, As Directed .  hydrALAZINE , 100 mg, Oral, TID .  insulin  GLARGINE-yfgn (SEMGLEE ) injection, 3 Units, Subcutaneous, QHS .  insulin  LISPRO (AdmeLOG , HumaLOG ) injection, 3 Units, Subcutaneous, Daily with breakfast .  insulin  LISPRO (AdmeLOG , HumaLOG ) injection, 3 Units, Subcutaneous, Daily with lunch .  insulin  LISPRO (AdmeLOG , HumaLOG ) injection, 3 Units, Subcutaneous, Daily with dinner .  insulin  REGULAR (HumuLIN R , NovoLIN R) injection, 0-8 Units, Subcutaneous, TID AC .  isosorbide  dinitrate, 40 mg, Oral, TID .  lidocaine , 0.5 mL, Subcutaneous, As Directed .  melatonin, 3 mg, Oral, QHS .  ondansetron , 4 mg, Intravenous, Q12H PRN .  pantoprazole , 40 mg, Oral, BID AC .  sennosides-docusate, 2 tablet, Oral, BID PRN .  sodium bicarbonate, 650 mg, Oral, TID .  [Held by provider] TORsemide , 80 mg, Oral, BID .  TORsemide , 80 mg, Oral, Once  Assessment/Plan:   Principal Problem:   Acute on chronic systolic CHF (congestive heart failure) (CMS/HHS-HCC) Active Problems:   Coronary atherosclerosis of native coronary artery   Non-small cell lung cancer (CMS/HHS-HCC)   Primary hypertension   Stage 3b chronic kidney disease (CMS/HHS-HCC)   Type 2 diabetes mellitus (CMS/HHS-HCC)   # Acute on chronic systolic HF (EF 25%) # ICM # Multivessel CAD # HTN # HLD # Peripheral vascular disease  Presented with volume overload and  HTN in the setting of medication nonadherence after recent discharge. pBNP 90k and CXR with bilateral pleural effusions. Diagnosed with HFrEF in 2021 when TTE showed EF 20-25%. Underwent work-up with [facility] 2021 revealing CTO of RCA with collaterals, 50% mLAD stenosis and 80% OM1, no interventions done at the time. Medically managed with later TTE in 2022 showing recovered EF. Last TTE 12/11 showed EF 25%, global dysfunction, severe RV dysfunction and moderate TR. - Continue oral diuresis with torsemide  80mg  x 1 today. Anticipate resuming home regimen (torsemide  80mg   BID) on 1/6 pending renal function - Continue home BP regimen: amlodipine, carvedilol , hydralazine , Imdur   # AKI  # CKD stage 3 # Nephrotic syndrome  # Metabolic acidosis Baseline Cr unclear, last outpatient Cr was 3.36 in November, GFR 15. Cr 3.7 at discharge 12/30. - Continue sodium bicarb  - Cr 3.8 today, diuresis as above - CTM with daily BMP   # Type 2 diabetes mellitus Last admission she was seen for AMS secondary to hypoglycemia. A1c 8.0%. Metformin  was discontinued with eGFR. Endocrinology was consulted that admission and recommended the following: resume Tresiba 7units DAILY ONLY and Novolog  6 units with breakfast, 5 units with lunch, and 5 units with dinner. No Jardiance  given poor PO intake and risk for DKA. Would benefit from a Dexcom or Libre sensor- sent to [facility] and enrolled in Meds to [place]. Follow up with her [facility] Endocrinologist.  - Continue glargine 3u daily and lispro 3u TID CC with meals with SSI   # Substance use disorder UDS negative on arrival. Patient requesting placement in sober living facility; but not amenable to restrictions imposed by sober living facilities. Plan to return to cousin's house. - Appreciate substance use team assistance   # Anemia # IDA # Vitamin B12 deficiency  Likely combination of IDA, B12 deficiecny and CKD related anemia. Baseline Hgb 8.7-9.5. - Continue folate    # Protein calorie Malnutrition # Hypoalbunemia  She has been diagnosed with mild protein-calorie malnutrition in the context of chronic illness, based on energy intake meeting < or equal to 75% of estimated requirement for > or equal to 1 month.  - Recommend oral nutrition.   # Dispo planning  Pt unable to pickup medications during last discharge due to lack of ride.  - Plan for Meds to Beds delivery at discharge   Code Status: Full Code VTE Prophylaxis: SCDs (sequential compression device) Discharge Planning: pending clinical course   [name]  ELIZABETH NESS, PA  ------------------------------------------------------------------------------- Attestation signed by Levora Garnette Rush, MD at 06/24/2023  1:48 PM  Attending Attestation:   68 yo F w ICM/HFrEF 25%, PAD w R BKA, stage 4 CKD, presented with worsening dyspnea and volume overload.    Recent discharge on 12/30 for acute HF. A that time cocaine + (negative on current admission). At that time, noted that EF had formerly been recovered. Angiogram deferred given CKD. Also, had significant GIB and many polyps removed that make hunting for ischemia further unappealing.    Looks optivolemic and feels well. Torsemide  80 x1 today. Working on discharge planning. Will need meds to beds.    Attestation Statement:   I personally saw the patient and performed a substantive portion of the medical decision making, in conjunction with the Advanced Practice Provider for the condition/treatment of Ms Constantin.  STEPHEN RUSH LEVORA, MD  -------------------------------------------------------------------------------

## 2023-06-26 ENCOUNTER — Telehealth: Payer: Self-pay

## 2023-06-26 ENCOUNTER — Encounter: Payer: Self-pay | Admitting: Oncology

## 2023-06-26 NOTE — Transitions of Care (Post Inpatient/ED Visit) (Signed)
 06/26/2023  Name: [name] [name] MRN: [id number] DOB: June 05, 1956  Today's TOC FU [name] Status: Today's TOC FU [name] Status:: Successful TOC FU [name] Completed TOC FU [name] Complete Date: 06/26/23 Patient's Name and Date of Birth confirmed.  Transition Care Management Follow-up Telephone [name] Date of Discharge: 06/25/23 Discharge Facility: Other ([facility]) Name of Other ([facility]) Discharge Facility: [facility] Type of Discharge: Inpatient Admission Primary Inpatient Discharge Diagnosis:: Other chest pain [phone]); Acute on chronic systolic (congestive) heart failure (P4976) How have you been since you were released from the hospital?: Better Any questions or concerns?: No  Items Reviewed: Did you receive and understand the discharge instructions provided?: Yes Medications obtained,verified, and reconciled?: Yes (Medications Reviewed) Any new allergies since your discharge?: No Dietary orders reviewed?: Yes Type of Diet Ordered:: Heart Healthy NAS Carb Modified Do you have support at home?: Yes People in Home: alone Name of Support/Comfort Primary Source: Friends Family  / [place]  Medications Reviewed Today: Medications Reviewed Today     Reviewed by [name] Camelia CROME, RN (Registered Nurse) on 06/26/23 at 1224  Med List Status: <None>   Medication Order Taking? Sig Documenting Provider Last Dose Status Informant  ACCU-CHEK GUIDE test strip [id number] Yes USE UP TO 4 TIMES A DAY AS DIRECTED [name] [name] Taking Active   Accu-Chek Softclix Lancets lancets [id number] Yes SMARTSIG:Topical 1-4 Times Daily Provider, Historical, [name] Taking Active Self  aspirin  EC 81 MG tablet [id number] Yes Take 81 mg by mouth in the morning. Swallow whole. Provider, Historical, [name] Taking Active Self  blood glucose meter kit and supplies KIT [id number] Yes Dispense based on patient and insurance preference. Use up to four times daily as directed. [name] [name] Taking Active Self   carboxymethylcellulose (REFRESH PLUS) 0.5 % SOLN [id number] Yes Place 1 drop into both eyes in the morning and at bedtime. Provider, Historical, [name] Taking Active Self  DULoxetine  (CYMBALTA ) 20 MG capsule [id number] Yes Take 2 capsules (40 mg total) by mouth 2 (two) times daily. [name] [name] Taking Active   HUMALOG  KWIKPEN 100 UNIT/ML KwikPen [id number] Yes Inject 8 Units into the skin 3 (three) times daily after meals. [name] [name] Taking Active   insulin  degludec (TRESIBA) 100 UNIT/ML FlexTouch Pen [id number] Yes Inject 10 Units into the skin 2 (two) times daily. Provider, Historical, [name] Taking Active   isosorbide  dinitrate (ISORDIL ) 30 MG tablet [id number] Yes TAKE 1 TABLET BY MOUTH THREE TIMES A DAY [name] [name] Taking Active   ketoconazole  (NIZORAL ) 2 % cream [id number] Yes APPLY TO FEET TOPICALLY IN THE MORNING AND AT BED DAILY [name] [name] Taking Active   metFORMIN  (GLUCOPHAGE -XR) 500 MG 24 hr tablet [id number] Yes TAKE 1 TABLET BY MOUTH TWICE A DAY [name] [name] Taking Active Self  metoprolol  succinate (TOPROL -XL) 50 MG 24 hr tablet [id number] Yes Take 1 tablet (50 mg total) by mouth daily. Take with or immediately following a meal. [name] [name] Taking Active   Multiple Vitamin (MULTIVITAMIN WITH MINERALS) TABS tablet [id number] Yes Take 1 tablet by mouth in the morning. Adults 50+ Provider, Historical, [name] Taking Active Self  ondansetron  (ZOFRAN ) 8 MG tablet [id number] No TAKE 1 TABLET 2 TIMES DAILY AS NEEDED (NAUSEA/VOMITING). START IF NEEDED ON 3RD DAY AFTER CISPLATIN.  Patient not taking: Reported on 06/26/2023   [name] [name] Not Taking Active   pantoprazole  (PROTONIX ) 40 MG tablet [id number] Yes Take 1 tablet (40 mg total) by mouth daily. [name],  [place], [name] Taking Active   potassium chloride  SA (KLOR-CON  M20) 20 MEQ tablet [id number] Yes TAKE 1 TABLET BY MOUTH EVERY DAY [name] [name] Taking Active   prochlorperazine  (COMPAZINE ) 10 MG tablet [id number]  No TAKE 1 TABLET (10 MG TOTAL) BY MOUTH EVERY 6 (SIX) HOURS AS NEEDED (NAUSEA OR VOMITING).  Patient not taking: Reported on 06/26/2023   [name] [name] Not Taking Active   spironolactone  (ALDACTONE ) 25 MG tablet [id number] Yes Take 1 tablet (25 mg total) by mouth daily. [name] [name] Taking Active           Medication reconciliation / review completed based on most recent discharge summary and EHR medication list. Confirmed patient is taking all newly prescribed medications as instructed and is aware of any changes to and / or dosage adjustments to medication regimen. Patient denies questions at this time and reports no barriers to medication adherence.   Home Care and Equipment/Supplies: Were Home Health Services Ordered?: No Any new equipment or medical supplies ordered?: No  Functional Questionnaire: Do you need assistance with bathing/showering or dressing?: No Do you need assistance with meal preparation?: No Do you need assistance with eating?: No Do you have difficulty maintaining continence: No Do you need assistance with getting out of bed/getting out of a chair/moving?: No Do you have difficulty managing or taking your medications?: Yes (Family manages and fills)  Follow up appointments reviewed: PCP Follow-up appointment confirmed?: No (She will [name] and schedule) [name] Provider Line [phone] Given: No Specialist Hospital Follow-up appointment confirmed?: Yes Date of Specialist follow-up appointment?: 07/02/23 Follow-Up Specialty Provider:: Cardiology 1/13 Do you need transportation to your follow-up appointment?: Yes Transportation Need Intervention Addressed By:: Other: (Uses [facility] and or family Friends) Do you understand care options if your condition(s) worsen?: Yes-patient verbalized understanding  SDOH Interventions Today    Flowsheet Row Most Recent Value  SDOH Interventions   Food Insecurity Interventions Intervention Not Indicated  Housing  Interventions Intervention Not Indicated  Transportation Interventions Intervention Not Indicated, Patient Resources (Friends/Family), Payor Benefit  [She uses Advertising Copy[employer] for [employer] and has support of family and friends at this time]  Utilities Interventions Intervention Not Indicated  Social Connections Interventions Intervention Not Indicated      Interventions Today    Flowsheet Row Most Recent Value  Chronic Disease   Chronic disease during today's visit Diabetes, Congestive Heart Failure (CHF)  General Interventions   General Interventions Discussed/Reviewed General Interventions Discussed, General Interventions Reviewed, Durable Medical Equipment (DME)  Durable Medical Equipment (DME) Glucomoter  Exercise Interventions   Exercise Discussed/Reviewed Physical Activity  Physical Activity Discussed/Reviewed Physical Activity Reviewed  Nutrition Interventions   Nutrition Discussed/Reviewed Nutrition Discussed, Carbohydrate meal planning, Decreasing sugar intake  Pharmacy Interventions   Pharmacy Dicussed/Reviewed Medications and their functions, Medication Adherence        Patient is at high risk for readmission and/or has history of  high utilization  Discussed VBCI  TOC program and weekly calls to patient to assess condition/status, medication management  and provide support/education as indicated .  Patient/ Caregiver voiced understanding and declined enrollment in the 30-day TOC Program.  She stated she is doing great. She has support st home to help her manage her appointments and medications. She did not feel need for additional follow-up despite 2nd admission in < 10 days     The patient has been provided with contact information for the care management team and has been advised to [name] with any health related questions  or concerns.    Bari Mayans , BSN, RN Care Management Coordinator [place]   [facility] [email or web address] Direct Dial: [phone]

## 2023-06-28 ENCOUNTER — Inpatient Hospital Stay: Payer: Medicare HMO | Admitting: Family Medicine

## 2023-06-28 DIAGNOSIS — Z7982 Long term (current) use of aspirin: Secondary | ICD-10-CM | POA: Diagnosis not present

## 2023-06-28 DIAGNOSIS — E8809 Other disorders of plasma-protein metabolism, not elsewhere classified: Secondary | ICD-10-CM | POA: Diagnosis not present

## 2023-06-28 DIAGNOSIS — F17200 Nicotine dependence, unspecified, uncomplicated: Secondary | ICD-10-CM | POA: Diagnosis not present

## 2023-06-28 DIAGNOSIS — I739 Peripheral vascular disease, unspecified: Secondary | ICD-10-CM | POA: Diagnosis not present

## 2023-06-28 DIAGNOSIS — E11649 Type 2 diabetes mellitus with hypoglycemia without coma: Secondary | ICD-10-CM | POA: Diagnosis not present

## 2023-06-28 DIAGNOSIS — Z8673 Personal history of transient ischemic attack (TIA), and cerebral infarction without residual deficits: Secondary | ICD-10-CM | POA: Diagnosis not present

## 2023-06-28 DIAGNOSIS — Z888 Allergy status to other drugs, medicaments and biological substances status: Secondary | ICD-10-CM | POA: Diagnosis not present

## 2023-06-28 DIAGNOSIS — R001 Bradycardia, unspecified: Secondary | ICD-10-CM | POA: Diagnosis not present

## 2023-06-28 DIAGNOSIS — D649 Anemia, unspecified: Secondary | ICD-10-CM | POA: Diagnosis not present

## 2023-06-28 DIAGNOSIS — E876 Hypokalemia: Secondary | ICD-10-CM | POA: Diagnosis not present

## 2023-06-28 DIAGNOSIS — Z79899 Other long term (current) drug therapy: Secondary | ICD-10-CM | POA: Diagnosis not present

## 2023-06-28 DIAGNOSIS — R918 Other nonspecific abnormal finding of lung field: Secondary | ICD-10-CM | POA: Diagnosis not present

## 2023-06-28 DIAGNOSIS — N179 Acute kidney failure, unspecified: Secondary | ICD-10-CM | POA: Diagnosis not present

## 2023-06-28 DIAGNOSIS — Z89511 Acquired absence of right leg below knee: Secondary | ICD-10-CM | POA: Diagnosis not present

## 2023-06-28 DIAGNOSIS — E162 Hypoglycemia, unspecified: Secondary | ICD-10-CM | POA: Diagnosis not present

## 2023-06-28 DIAGNOSIS — E119 Type 2 diabetes mellitus without complications: Secondary | ICD-10-CM | POA: Diagnosis not present

## 2023-06-28 DIAGNOSIS — R680 Hypothermia, not associated with low environmental temperature: Secondary | ICD-10-CM | POA: Diagnosis not present

## 2023-06-28 DIAGNOSIS — E1122 Type 2 diabetes mellitus with diabetic chronic kidney disease: Secondary | ICD-10-CM | POA: Diagnosis not present

## 2023-06-28 DIAGNOSIS — I130 Hypertensive heart and chronic kidney disease with heart failure and stage 1 through stage 4 chronic kidney disease, or unspecified chronic kidney disease: Secondary | ICD-10-CM | POA: Diagnosis not present

## 2023-06-28 DIAGNOSIS — T68XXXA Hypothermia, initial encounter: Secondary | ICD-10-CM | POA: Diagnosis not present

## 2023-06-28 DIAGNOSIS — I517 Cardiomegaly: Secondary | ICD-10-CM | POA: Diagnosis not present

## 2023-06-28 DIAGNOSIS — Z4682 Encounter for fitting and adjustment of non-vascular catheter: Secondary | ICD-10-CM | POA: Diagnosis not present

## 2023-06-28 DIAGNOSIS — N1832 Chronic kidney disease, stage 3b: Secondary | ICD-10-CM | POA: Diagnosis not present

## 2023-06-28 DIAGNOSIS — J90 Pleural effusion, not elsewhere classified: Secondary | ICD-10-CM | POA: Diagnosis not present

## 2023-06-28 DIAGNOSIS — I5020 Unspecified systolic (congestive) heart failure: Secondary | ICD-10-CM | POA: Diagnosis not present

## 2023-06-28 DIAGNOSIS — I2510 Atherosclerotic heart disease of native coronary artery without angina pectoris: Secondary | ICD-10-CM | POA: Diagnosis not present

## 2023-06-28 DIAGNOSIS — Z8679 Personal history of other diseases of the circulatory system: Secondary | ICD-10-CM | POA: Diagnosis not present

## 2023-06-28 DIAGNOSIS — Z86711 Personal history of pulmonary embolism: Secondary | ICD-10-CM | POA: Diagnosis not present

## 2023-06-29 DIAGNOSIS — E8809 Other disorders of plasma-protein metabolism, not elsewhere classified: Secondary | ICD-10-CM | POA: Diagnosis not present

## 2023-06-29 DIAGNOSIS — I130 Hypertensive heart and chronic kidney disease with heart failure and stage 1 through stage 4 chronic kidney disease, or unspecified chronic kidney disease: Secondary | ICD-10-CM | POA: Diagnosis not present

## 2023-06-29 DIAGNOSIS — R918 Other nonspecific abnormal finding of lung field: Secondary | ICD-10-CM | POA: Diagnosis not present

## 2023-06-29 DIAGNOSIS — E1122 Type 2 diabetes mellitus with diabetic chronic kidney disease: Secondary | ICD-10-CM | POA: Diagnosis not present

## 2023-06-29 DIAGNOSIS — I2510 Atherosclerotic heart disease of native coronary artery without angina pectoris: Secondary | ICD-10-CM | POA: Diagnosis not present

## 2023-06-29 DIAGNOSIS — I739 Peripheral vascular disease, unspecified: Secondary | ICD-10-CM | POA: Diagnosis not present

## 2023-06-29 DIAGNOSIS — D649 Anemia, unspecified: Secondary | ICD-10-CM | POA: Diagnosis not present

## 2023-06-29 DIAGNOSIS — E876 Hypokalemia: Secondary | ICD-10-CM | POA: Diagnosis not present

## 2023-06-29 DIAGNOSIS — I5020 Unspecified systolic (congestive) heart failure: Secondary | ICD-10-CM | POA: Diagnosis not present

## 2023-06-29 DIAGNOSIS — J90 Pleural effusion, not elsewhere classified: Secondary | ICD-10-CM | POA: Diagnosis not present

## 2023-06-29 DIAGNOSIS — Z4682 Encounter for fitting and adjustment of non-vascular catheter: Secondary | ICD-10-CM | POA: Diagnosis not present

## 2023-06-29 DIAGNOSIS — E162 Hypoglycemia, unspecified: Secondary | ICD-10-CM | POA: Insufficient documentation

## 2023-06-29 DIAGNOSIS — N1832 Chronic kidney disease, stage 3b: Secondary | ICD-10-CM | POA: Diagnosis not present

## 2023-06-29 DIAGNOSIS — N179 Acute kidney failure, unspecified: Secondary | ICD-10-CM | POA: Diagnosis not present

## 2023-06-30 DIAGNOSIS — I2510 Atherosclerotic heart disease of native coronary artery without angina pectoris: Secondary | ICD-10-CM | POA: Diagnosis not present

## 2023-06-30 DIAGNOSIS — I5020 Unspecified systolic (congestive) heart failure: Secondary | ICD-10-CM | POA: Diagnosis not present

## 2023-06-30 DIAGNOSIS — E1122 Type 2 diabetes mellitus with diabetic chronic kidney disease: Secondary | ICD-10-CM | POA: Diagnosis not present

## 2023-06-30 DIAGNOSIS — I739 Peripheral vascular disease, unspecified: Secondary | ICD-10-CM | POA: Diagnosis not present

## 2023-06-30 DIAGNOSIS — I130 Hypertensive heart and chronic kidney disease with heart failure and stage 1 through stage 4 chronic kidney disease, or unspecified chronic kidney disease: Secondary | ICD-10-CM | POA: Diagnosis not present

## 2023-06-30 DIAGNOSIS — Z794 Long term (current) use of insulin: Secondary | ICD-10-CM | POA: Diagnosis not present

## 2023-06-30 DIAGNOSIS — N1832 Chronic kidney disease, stage 3b: Secondary | ICD-10-CM | POA: Diagnosis not present

## 2023-06-30 DIAGNOSIS — D649 Anemia, unspecified: Secondary | ICD-10-CM | POA: Diagnosis not present

## 2023-06-30 DIAGNOSIS — N179 Acute kidney failure, unspecified: Secondary | ICD-10-CM | POA: Diagnosis not present

## 2023-07-01 NOTE — Progress Notes (Signed)
 [facility]    PRIMARY CARDIOLOGIST: Establishing with Dr Antonieta   PRIMARY CARE PROVIDER:   Joshua Cathryne Rodney, MD [address] [facility] [place] [address] [phone]     PATIENT PROFILE: [name] [name] is a 68 y.o. female with PMH HFrEF, CAD (known CTO of RCA), former cocaine and tobacco use, mixed ischemic and NICM, PVD, HTN, type 2 DM, NSCLC (stage IIIa) s/p LL lobectomy and LN dissection, R BKA 2021, CKD stage 3b, CVA, chronic anemia, and pulmonary embolism who presents for evaluation and management of hospital follow up.  LAST CLINIC VISIT/HOSPITALIZATION: Admitted to HF service 1/2 - 06/25/23. Volume overloaded. Recent admission prior to that from 12/9 - 06/18/23 (at that time new HF diagnosis, suspected mixed etiology) then did not pick up meds at DC. This January admission pBNP was 89k (131k prior). IV diuresed. BL effusions on CXR. Resumed home GDMT/HTN agents. DC at 115 lbs.   Then admitted again a few days later 1/9 - 06/30/23 with hypoglycemia. Incorrect home insulin  administration, which was corrected in hospital. One dose of IV lasix  in hospital. AKI at admission which was improving at DC.   INTERVAL HISTORY: Today presents for hospital follow up. Just DC from gen med 2 days ago. She feels ok. She lives with her cousin now which is the long term plan. Her breathing is ok. She has prosthetic RLE and is able to stand/walk with this. No DOE with ADLs. She is taking all meds, inc TID meds. No further drug use, last cocaine was Nov 2024 per pt. She does not have a home BP cuff or wt scale.   HISTORY   PROBLEM LIST: Problem List  Date Reviewed: 06/24/2023          ICD-10-CM Priority Class Noted - Resolved Diagnosed   Hypoglycemia E16.2   06/29/2023 - Present    GIB (gastrointestinal bleeding) K92.2   06/12/2023 - Present    Hematochezia K92.1   06/10/2023 - Present    Acute blood loss anemia D62    06/10/2023 - Present    Vaginal bleeding N93.9   06/05/2023 - Present    Acute deep vein thrombosis (DVT) of right upper extremity (CMS/HHS-HCC) I82.621   06/02/2023 - Present    Acute on chronic systolic CHF (congestive heart failure) (CMS/HHS-HCC) I50.23   05/30/2023 - Present    Nephrotic syndrome N04.9   05/30/2023 - Present    Syncope R55   05/28/2023 - Present    Sinus bradycardia R00.1   05/28/2023 - Present    Vitamin B12 deficiency (Chronic) E53.8   07/10/2022 - Present    Chemotherapy-induced nausea R11.0, T45.1X5A   07/28/2021 - Present    Anemia in chronic kidney disease (CKD) (Chronic) N18.9, D63.1   04/12/2021 - Present    Moderate protein-calorie malnutrition (CMS/HHS-HCC) (Chronic) E44.0   04/12/2021 - Present    Stage 3b chronic kidney disease (CMS/HHS-HCC) (Chronic) N18.32   04/12/2021 - Present    Dyslipidemia E78.5   04/05/2021 - Present    Chronic pain due to neoplasm G89.3   02/10/2021 - Present    S/P lobectomy of lung Z90.2   01/17/2021 - Present    Non-small cell lung cancer (CMS/HHS-HCC) (Chronic) C34.90   12/30/2020 - Present    Primary hypertension I10   12/09/2020 - Present    Depression F32.A   12/09/2020 - Present    Heart failure with recovered ejection fraction (HFrecEF) (CMS/HHS-HCC)  I50.32   02/16/2020 - Present    Multiple subsegmental pulmonary emboli without acute cor pulmonale (CMS/HHS-HCC) I26.94   02/15/2020 - Present    Iron  deficiency anemia D50.9   12/18/2019 - Present    Peripheral vascular disease (CMS-HCC) I73.9   08/17/2019 - Present    Tobacco abuse, in remission F17.201   08/17/2019 - Present    Dysphagia R13.10   02/26/2017 - Present    Nicotine dependence, uncomplicated F17.200   11/26/2014 - Present    Coronary atherosclerosis of native coronary artery I25.10   06/06/2010 - Present    Hyperlipidemia E78.5   10/29/2007 - Present    Type 2 diabetes mellitus (CMS/HHS-HCC) E11.9   08/30/2007 - Present    SOCIAL HX: Social History   Socioeconomic History  .  Marital status: Legally Separated  Tobacco Use  . Smoking status: Former    Types: Cigarettes  . Smokeless tobacco: Never  Vaping Use  . Vaping status: Never Used  Substance and Sexual Activity  . Alcohol  use: Not Currently  . Drug use: Not Currently  . Sexual activity: Not Currently   Social Drivers of Health   Financial Resource Strain: Low Risk  (06/29/2023)   Overall Financial Resource Strain (CARDIA)   . Difficulty of Paying Living Expenses: Not hard at all  Food Insecurity: No Food Insecurity (06/29/2023)   Hunger Vital Sign   . Worried About Programme Researcher, Broadcasting/film/video in the Last Year: Never true   . Ran Out of Food in the Last Year: Never true  Transportation Needs: No Transportation Needs (06/29/2023)   PRAPARE - Transportation   . Lack of Transportation (Medical): No   . Lack of Transportation (Non-Medical): No  Recent Concern: Transportation Needs - Unmet Transportation Needs (06/26/2023)   Received from [facility] - Transportation   . Lack of Transportation (Medical): Yes   . Lack of Transportation (Non-Medical): Yes  Stress: Stress Concern Present (12/15/2021)   Received from [facility], [facility]   [facility] - Occupational Stress Questionnaire   . Feeling of Stress : To some extent  Social Connections: Moderately Isolated (06/26/2023)   Received from [facility]   Social Connection and Isolation Panel [NHANES]   . Frequency of Communication with Friends and Family: More than three times a week   . Frequency of Social Gatherings with Friends and Family: More than three times a week   . Attends Religious Services: Never   . Active Member of Clubs or Organizations: Yes   . Attends Banker Meetings: 1 to 4 times per year   . Marital Status: Widowed  Housing Stability: Low Risk  (06/29/2023)   Housing Stability Vital Sign   . Unable to Pay for Housing in the Last Year: No   . Number of Times Moved in the Last Year: 0    . Homeless in the Last Year: No    FAMILY HX: Family History  Problem Relation Age of Onset  . Deep vein thrombosis (DVT or abnormal blood clot formation) Mother   . Myocardial Infarction (Heart attack) Mother   . Kidney disease Brother     CURRENT MEDICATIONS AND ALLERGIES   Current Outpatient Medications  Medication Sig Dispense Refill  . amLODIPine (NORVASC) 10 MG tablet Take 1 tablet (10 mg total) by mouth once daily 30 tablet 1  . aspirin  81 MG chewable tablet Take 1 tablet (81 mg total) by mouth once daily 30 tablet 1  .  atorvastatin  (LIPITOR ) 20 MG tablet Take 1 tablet (20 mg total) by mouth once daily 30 tablet 1  . carvediloL  (COREG ) 25 MG tablet Take 1 tablet (25 mg total) by mouth 2 (two) times daily with meals 60 tablet 1  . ergocalciferol, vitamin D2, 1,250 mcg (50,000 unit) capsule Take 1 capsule (50,000 Units total) by mouth once a week for 8 doses 8 capsule 0  . folic acid  (FOLVITE ) 1 MG tablet Take 1 tablet (1 mg total) by mouth once daily 30 tablet 3  . HUMALOG  KWIKPEN INSULIN  pen injector (concentration 100 units/mL) Inject 4 units at breakfast, 4 units at lunch, 4 units at dinner 3 mL 0  . hydrALAZINE  (APRESOLINE ) 100 MG tablet Take 1 tablet (100 mg total) by mouth 3 (three) times daily 90 tablet 1  . isosorbide  dinitrate (ISORDIL ) 20 MG tablet Take 2 tablets (40 mg total) by mouth 3 (three) times daily 180 tablet 1  . pantoprazole  (PROTONIX ) 40 MG DR tablet Take 1 tablet (40 mg total) by mouth 2 (two) times daily before meals 60 tablet 1  . pen needle, diabetic 31 gauge x 3/16 needle Use as directed 100 each 12  . sodium bicarbonate 650 MG tablet Take 1 tablet (650 mg total) by mouth 3 (three) times daily 90 tablet 1  . TORsemide  (DEMADEX ) 20 MG tablet Take 4 tablets (80 mg total) by mouth 2 (two) times daily 240 tablet 1  . TRESIBA FLEXTOUCH U-100 pen injector (concentration 100 units/mL) Inject 7 Units subcutaneously once daily 3 mL 0  . carboxymethylcellulose  (REFRESH TEARS) 0.5 % ophthalmic solution Place 1 drop into both eyes 2 (two) times daily    . diphenhydrAMINE  (BENADRYL ) 25 mg tablet Take 25 mg by mouth every 6 (six) hours as needed    . DULoxetine  (CYMBALTA ) 20 MG DR capsule Take 40 mg by mouth 2 (two) times daily     No current facility-administered medications for this visit.    Allergies  Allergen Reactions  . Lisinopril Swelling    REVIEW OF SYSTEMS  Comprehensive review of systems is otherwise negative except as indicated below and in the HPI  Constitutional: []  fever, []  rigors, []  nightsweats, []  unexpected weight change, []  fatigue, []  loud snoring, []  daytime somnolence  Opthalmologic: []  vision changes  Otolaryngologic: []  recent hearing changes  Oropharnyx: []  congestion []  oral sores/ulcers  Respiratory: []  dyspnea, []  cough, []  hemoptysis  Cardiovascular: []  chest pain, []  palpitations, []  orthopnea, []  PND,   []  peripheral edema, []  syncope, []  leg pain with walking  Gastrointestinal:              []  abdominal pain, []  nausea, []  vomiting, []  diarrhea,  []  constipation, []  melena, []  acid reflux  Genitourinary: []  dysuria, []  hematuria, []  nocturia  Musculoskeletal:             []  joint pain, []  myalgias  Neurologic:   Endocrine:            []  headache, []  neuropathy, []  localized weakness []  excessive urination, []  excessive thirst  Hematologic: []  bruising/bleeding   Dermatologic: []  skin excoriations []  rash   PHYSICAL EXAM   Vitals:   07/02/23 1334  BP: (!) 169/89  Pulse:   Resp:   Temp:      Wt Readings from Last 3 Encounters:  07/02/23 50 kg (110 lb 3.7 oz)  06/30/23 48.4 kg (106 lb 11.2 oz)  06/25/23 52.5 kg (115 lb 11.2 oz)     BP  Readings from Last 3 Encounters:  07/02/23 (!) 169/89  06/30/23 129/75  06/25/23 129/64       General Appearance:  Alert, cooperative, no distress, appears stated age  HEENT:  PERRL,oropharynx clear  Neck: Carotids 2+ bilaterally, no carotid bruits, JVD is  normal  Lungs:   Clear to auscultation bilaterally, respirations unlabored  Heart:  Regular rate and rhythm, normal S1 and S2, no murmurs/rubs/gallops, non-displaced PMI, no RV heave  Abdomen:   Soft, non-tender, bowel sounds active,  no masses, no organomegaly  Extremities: RLE amputation with prosthesis in place. LLE with trace edema to mid shin   Pulses: Dorsalis pedis 2+ and symmetric bilaterally  Skin: No lower extremity rashes or ulcers  Neurologic: Alert, interactive, and appropriate, grossly moving all 4 extremities   DATA     Recent Labs    05/29/23 0416  CHOLTOTAL 258  HDL 46  LDLCALC 183  TRIG 856    Recent Labs    05/28/23 1120 05/28/23 2334 05/29/23 0416 06/17/23 0704 06/18/23 0450 06/21/23 0700 06/22/23 0417 06/25/23 0535 06/28/23 1702 06/29/23 0626 06/30/23 0418  NA 135  131* 136   < > 139 138 142   < > 140 139 138 139  K 4.7 3.7   < > 4.1 4.0 4.0   < > 3.6 3.5 3.0* 4.6  CL 111* 106   < > 104 103 110*   < > 107 103 102 106  CO2 14* 15*   < > 25 23 23    < > 22 23 24 22   BUN 43* 45*   < > 45* 46* 41*   < > 53* 62* 63* 68*  CREATININE 3.5* 3.8*   < > 3.6* 3.7* 3.1*   < > 3.8* 4.1* 3.9* 3.8*  BUNCRE 12 12   < > 12 13 13    < > 14 15 16 18   GFR 14 13   < > 13 13 16    < > 13 11 12 13   GLUCOSE 369* 52*   < > 190* 186* 176*   < > 151* 143* 107 158*  MG  --  2.1   < > 1.9 1.8 1.8   < > 2.0 1.8 1.8 1.8  ALT 18 21  --   --   --  22  --   --  19  --   --   AST 23 29  --   --   --  26  --   --  26  --   --   TBILI 0.4 0.5  --   --   --  0.8  --   --  0.5  --   --   ALB 1.9* 2.0*   < > 1.7* 1.6* 1.9*  --   --  2.2*  --   --    < > = values in this interval not displayed.    Recent Labs    06/28/23 1702 06/29/23 0626 06/30/23 0418  WBC 5.0 5.6 6.0  HGB 10.7* 9.2* 8.6*  HCT 34.8* 29.1* 26.7*  PLT 236 308 297    Recent Labs    05/28/23 1120 05/29/23 0416 06/28/23 1702  INR 0.9  --  0.9  TSH 4.87  --   --   HGBA1C  --  8.0*  --     Recent Labs     05/28/23 1122 06/21/23 0700 06/28/23 1702  PROBNP 131,060* 10,147* 83,674*    Recent Labs  06/22/23 0417  IRON  40  TIBC 204*  PCTSAT 20  FERRITIN 1,284*      ASSESSMENT AND PLAN  Ms. Bolanos is a 68 y.o. female who is seen in clinic today for the below problems:  1. Acute on chronic systolic CHF (congestive heart failure) (CMS/HHS-HCC)   2. Non-small cell lung cancer, unspecified laterality (CMS/HHS-HCC)   3. Stage 3b chronic kidney disease (CMS/HHS-HCC)   4. Primary hypertension   5. Peripheral vascular disease (CMS-HCC)   6. Moderate protein-calorie malnutrition (CMS/HHS-HCC)      Orders Placed This Encounter  Procedures  . Ambulatory Referral to [facility]    65 yof with PMH as above who presents for hospital follow up. She has had 3 recent admissions. Dec 2024 for HF exacerbation. Historically she had known ICM with HFrEF however then with HFimpEF as of 2022. TTE during this Dec 2024 admission showed LVEF back down to 25%, LVIDd 4.5cm. She has known CAD with RCA CTO, 50% LAD, and 80% OM1 stenosis from LHC back in 2021. No eval since then.   During her Dec 2024 admission ischemic eval/repeat LHC was deferred given ongoing GIB, anemia, and multiple polypectomies. Additionally, she noted ongoing cocaine use which was felt to be contributing to her EF reduction. Finally, given her CKD with GFR ~ 15, LHC was deferred. She was diuresed that admission however at discharge did not have her meds set up so was admitted again 3 days later. During this Jan admission she was again IV diuresed, further workup for HF deferred, and was sent home (with meds-beds) with GDMT and torsemide . She then had non cardiac (hypoglycemia) admission for 3 days, and was discharged home just 2 days ago.   Today she tells me she feels ok. She is able to get around with her prosthesis and denies DOE with ADLs. No orthopnea past 2 nights at home.  Plan:  - HFrEF: Likely mixed etiology. Could consider further  eval at follow up. For today she appeared euvolemic on exam and denied symptoms. Continues home coreg  25 BID, hydral 100 TID, and isordil  40 TID. No RAASi or SGLT2i given GFR < 20. Continued torsemide  at 80 BID. She was given weight scale in clinic today and reviewed use and when to contact HCC - HTN: SBP 160s in clinic today. However was better controlled in hospital last week and was SBP 120s at DC two days ago. On recheck today was 140s. For now cont regimen (amlodipine 10 along with above HF regimen). Referral to [facility] today to help attain BP cuff and with ongoing disease/med education. May need further agent at f/u if BP remaining high - CKD: Labs stable from 2 days ago. She is on sodium bicarb - CAD: Denies anginal symptoms. Cont home ASA. LDL was 183 Dec 2024 and 20 mg atorvastatin  was started. Recheck lipids at f/u next month   DISPOSITION   No follow-ups on file.  Future Appointments     Date/Time Provider Department Center Visit Type   07/25/2023 10:00 AM (Arrive by 9:30 AM) Antonieta Darby, MD [facility] INTERNAL REFERRAL       I spent a total of 40 minutes in both face-to-face and non-face-to-face activities, excluding procedures performed, for this visit on the date of this encounter.  Medication list was reviewed with patient, changes/additions updated at time of visit, and prescriptions refilled appropriately per e-prescribing system.  There were no barriers to communication.  She was educated regarding dx, tx plan, medications and possible  side effects.  She was educated regarding emergent signs and symptoms, and knows to notify our office immediately or seek emergent medical attention if necessary.  She voiced understanding and agreement with tx plan.    Attestation Statement:   I personally performed the service, non-incident to. (WP)   TODD MICHAEL MCVEIGH, PA   TODD OZELL KIDNEY, PA  Cardiology Physician Assistant  [facility]  Cell: [phone] Office: [phone]

## 2023-07-02 ENCOUNTER — Telehealth: Payer: Self-pay | Admitting: Family Medicine

## 2023-07-02 ENCOUNTER — Telehealth: Payer: Self-pay

## 2023-07-02 DIAGNOSIS — I10 Essential (primary) hypertension: Secondary | ICD-10-CM | POA: Diagnosis not present

## 2023-07-02 DIAGNOSIS — I739 Peripheral vascular disease, unspecified: Secondary | ICD-10-CM | POA: Diagnosis not present

## 2023-07-02 DIAGNOSIS — E440 Moderate protein-calorie malnutrition: Secondary | ICD-10-CM | POA: Diagnosis not present

## 2023-07-02 DIAGNOSIS — C3490 Malignant neoplasm of unspecified part of unspecified bronchus or lung: Secondary | ICD-10-CM | POA: Diagnosis not present

## 2023-07-02 DIAGNOSIS — I5023 Acute on chronic systolic (congestive) heart failure: Secondary | ICD-10-CM | POA: Diagnosis not present

## 2023-07-02 DIAGNOSIS — N1832 Chronic kidney disease, stage 3b: Secondary | ICD-10-CM | POA: Diagnosis not present

## 2023-07-02 NOTE — Telephone Encounter (Signed)
 Called patient left voice mail to set up hospital follow up. Patient cancelled hosp f/u on 06/28/23.

## 2023-07-02 NOTE — Transitions of Care (Post Inpatient/ED Visit) (Signed)
   07/02/2023  Name: [name] [name] MRN: [id number] DOB: February 28, 1956  Today's TOC FU Call Status: Today's TOC FU Call Status:: Unsuccessful Call (1st Attempt) Unsuccessful Call (1st Attempt) Date: 07/02/23  Attempted to reach the patient regarding the most recent Inpatient/ED visit.  Follow Up Plan: Additional outreach attempts will be made to reach the patient to complete the Transitions of Care (Post Inpatient/ED visit) call.   Signature Julian Lemmings, LPN [facility] Nurse Health Advisor Direct Dial [phone]

## 2023-07-03 NOTE — Transitions of Care (Post Inpatient/ED Visit) (Signed)
   07/03/2023  Name: KHALESSI BLOUGH MRN: [id number] DOB: Feb 06, 1956  Today's TOC FU Call Status: Today's TOC FU Call Status:: Unsuccessful Call (2nd Attempt) Unsuccessful Call (1st Attempt) Date: 07/02/23 Unsuccessful Call (2nd Attempt) Date: 07/03/23  Attempted to reach the patient regarding the most recent Inpatient/ED visit.  Follow Up Plan: Additional outreach attempts will be made to reach the patient to complete the Transitions of Care (Post Inpatient/ED visit) call.   Signature Julian Lemmings, LPN [facility] Nurse Health Advisor Direct Dial [phone]

## 2023-07-04 NOTE — Transitions of Care (Post Inpatient/ED Visit) (Signed)
   07/04/2023  Name: [name] [name] MRN: [id number] DOB: 22-Mar-1956  Today's TOC FU Call Status: Today's TOC FU Call Status:: Unsuccessful Call (3rd Attempt) Unsuccessful Call (1st Attempt) Date: 07/02/23 Unsuccessful Call (2nd Attempt) Date: 07/03/23 Unsuccessful Call (3rd Attempt) Date: 07/04/23  Attempted to reach the patient regarding the most recent Inpatient/ED visit.  Follow Up Plan: No further outreach attempts will be made at this time. We have been unable to contact the patient.  Signature Darrall Ellison, LPN [facility] Nurse Health Advisor Direct Dial [phone]

## 2023-07-09 ENCOUNTER — Encounter: Payer: Self-pay | Admitting: Family Medicine

## 2023-07-09 ENCOUNTER — Ambulatory Visit (INDEPENDENT_AMBULATORY_CARE_PROVIDER_SITE_OTHER): Payer: 59 | Admitting: Family Medicine

## 2023-07-09 VITALS — BP 98/58 | HR 73 | Ht 65.0 in | Wt 109.2 lb

## 2023-07-09 DIAGNOSIS — I10 Essential (primary) hypertension: Secondary | ICD-10-CM | POA: Diagnosis not present

## 2023-07-09 DIAGNOSIS — Z794 Long term (current) use of insulin: Secondary | ICD-10-CM

## 2023-07-09 DIAGNOSIS — N1832 Chronic kidney disease, stage 3b: Secondary | ICD-10-CM

## 2023-07-09 NOTE — Progress Notes (Signed)
Date:  07/09/2023   Name:  [name] [name]   DOB:  [birth year]   MRN:  [id number]   Chief Complaint: ER follow up (Patient presents today for a follow up from the ED. Patient went to ED fro low blood sugar and high blood pressure. She is feeling well today.)  Diabetes She presents for her follow-up diabetic visit. She has type 2 diabetes mellitus. Her disease course has been stable. There are no diabetic associated symptoms. Pertinent negatives for diabetes include no chest pain, no polydipsia, no polyuria and no weakness. There are no hypoglycemic complications. Symptoms are stable. There are no diabetic complications. Current diabetic treatment includes insulin injections. Her weight is stable. She participates in exercise daily.    Lab Results  Component Value Date   NA 138 05/26/2023   K 3.8 05/26/2023   CO2 18 (L) 05/26/2023   GLUCOSE 214 (H) 05/26/2023   BUN 39 (H) 05/26/2023   CREATININE 3.54 (H) 05/26/2023   CALCIUM 7.8 (L) 05/26/2023   GFRNONAA 14 (L) 05/26/2023   Lab Results  Component Value Date   CHOL 268 (H) 05/26/2023   HDL 63 05/26/2023   LDLCALC 175 (H) 05/26/2023   TRIG 151 (H) 05/26/2023   CHOLHDL 4.3 05/26/2023   Lab Results  Component Value Date   TSH 2.055 08/17/2021   Lab Results  Component Value Date   HGBA1C 7.4 06/09/2022   Lab Results  Component Value Date   WBC 4.6 05/26/2023   HGB 10.7 (L) 05/26/2023   HCT 33.6 (L) 05/26/2023   MCV 92.6 05/26/2023   PLT 313 05/26/2023   Lab Results  Component Value Date   ALT 18 05/26/2023   AST 18 05/26/2023   ALKPHOS 102 05/26/2023   BILITOT 0.6 05/26/2023   No results found for: "25OHVITD2", "25OHVITD3", "VD25OH"   Review of Systems  Eyes:  Negative for visual disturbance.  Respiratory:  Negative for chest tightness, shortness of breath and wheezing.   Cardiovascular:  Negative for chest pain, palpitations and leg swelling.  Gastrointestinal:  Negative for abdominal pain.  Endocrine: Negative  for polydipsia and polyuria.  Neurological:  Negative for weakness.    Patient Active Problem List   Diagnosis Date Noted   Vitamin B12 deficiency 07/10/2022   Port-A-Cath in place 07/10/2022   Hypokalemia 12/26/2021   Debility    Liver function test abnormality    Hypovolemic shock (HCC) 08/03/2021   AKI (acute kidney injury) (HCC) 08/03/2021   Transaminitis 08/02/2021   Chemotherapy-induced nausea 07/28/2021   Encounter for antineoplastic immunotherapy 07/07/2021   Macrocytic anemia 07/07/2021   Moderate protein-calorie malnutrition (HCC) 04/12/2021   Stage 3b chronic kidney disease (HCC) 04/12/2021   Anemia in chronic kidney disease (CKD) 04/12/2021   Dyslipidemia 04/05/2021   Encounter for antineoplastic chemotherapy 02/28/2021   Non-small cell cancer of left lung (HCC) 02/17/2021   Tinea pedis of both feet 02/10/2021   Chronic pain due to neoplasm 02/10/2021   Adenocarcinoma, lung, left (HCC) 01/19/2021   S/P Robotic Assisted Video Thoracoscopy with Left Lower Lobectomy Lung, Intercostal nerve block, lymph node dissection 01/17/2021   Non-small cell lung cancer (HCC) 12/30/2020   Depression 12/09/2020   Hyperlipidemia 12/09/2020   Primary hypertension 12/09/2020   Heart failure with reduced ejection fraction (HCC) 02/16/2020   Multiple subsegmental pulmonary emboli without acute cor pulmonale (HCC) 02/15/2020   Iron deficiency anemia 12/18/2019   Rectal pain 12/18/2019   Diabetic foot ulcer (HCC) 10/03/2019   Cellulitis 08/17/2019  Cocaine use 08/17/2019   Peripheral vascular disease (HCC) 08/17/2019   Tobacco use disorder 08/17/2019   Tobacco abuse, in remission 08/17/2019   Cervical dysplasia 04/02/2019   CAD (coronary artery disease), native coronary artery 06/06/2010   Type II diabetes mellitus (HCC) 06/06/2010   Acute subendocardial infarction, initial episode of care [facility]) 06/05/2010    Allergies  Allergen Reactions   Lisinopril Swelling and Other (See  Comments)    Recurrent AKI and hyperkalemia when initiation attempted.    Past Surgical History:  Procedure Laterality Date   IR IMAGING GUIDED PORT INSERTION  07/27/2021   Right lower extremity BKA Right     Social History   Tobacco Use   Smoking status: Former    Current packs/day: 0.00    Types: Cigars, Cigarettes    Quit date: 01/17/2021    Years since quitting: 2.4   Smokeless tobacco: Never  Vaping Use   Vaping status: Never Used  Substance Use Topics   Alcohol use: Never   Drug use: Not Currently     Medication list has been reviewed and updated.  Current Meds  Medication Sig   ACCU-CHEK GUIDE test strip USE UP TO 4 TIMES A DAY AS DIRECTED   Accu-Chek Softclix Lancets lancets SMARTSIG:Topical 1-4 Times Daily   aspirin EC 81 MG tablet Take 81 mg by mouth in the morning. Swallow whole.   blood glucose meter kit and supplies KIT Dispense based on patient and insurance preference. Use up to four times daily as directed.   carboxymethylcellulose (REFRESH PLUS) 0.5 % SOLN Place 1 drop into both eyes in the morning and at bedtime.   DULoxetine (CYMBALTA) 20 MG capsule Take 2 capsules (40 mg total) by mouth 2 (two) times daily.   HUMALOG KWIKPEN 100 UNIT/ML KwikPen Inject 8 Units into the skin 3 (three) times daily after meals.   insulin degludec (TRESIBA) 100 UNIT/ML FlexTouch Pen Inject 10 Units into the skin 2 (two) times daily.   isosorbide dinitrate (ISORDIL) 30 MG tablet TAKE 1 TABLET BY MOUTH THREE TIMES A DAY   ketoconazole (NIZORAL) 2 % cream APPLY TO FEET TOPICALLY IN THE MORNING AND AT BED DAILY   metFORMIN (GLUCOPHAGE-XR) 500 MG 24 hr tablet TAKE 1 TABLET BY MOUTH TWICE A DAY   metoprolol succinate (TOPROL-XL) 50 MG 24 hr tablet Take 1 tablet (50 mg total) by mouth daily. Take with or immediately following a meal.   Multiple Vitamin (MULTIVITAMIN WITH MINERALS) TABS tablet Take 1 tablet by mouth in the morning. Adults 50+   ondansetron (ZOFRAN) 8 MG tablet TAKE 1  TABLET 2 TIMES DAILY AS NEEDED (NAUSEA/VOMITING). START IF NEEDED ON 3RD DAY AFTER CISPLATIN.   pantoprazole (PROTONIX) 40 MG tablet Take 1 tablet (40 mg total) by mouth daily.   potassium chloride SA (KLOR-CON M20) 20 MEQ tablet TAKE 1 TABLET BY MOUTH EVERY DAY   prochlorperazine (COMPAZINE) 10 MG tablet TAKE 1 TABLET (10 MG TOTAL) BY MOUTH EVERY 6 (SIX) HOURS AS NEEDED (NAUSEA OR VOMITING).   spironolactone (ALDACTONE) 25 MG tablet Take 1 tablet (25 mg total) by mouth daily.       07/09/2023    2:51 PM 03/05/2023    2:09 PM 08/08/2022    3:04 PM 06/20/2022    1:30 PM  GAD 7 : Generalized Anxiety Score  Nervous, Anxious, on Edge 0 0 0 0  Control/stop worrying 0 0 0 0  Worry too much - different things 0 0 0 0  Trouble relaxing  0 0 0 0  Restless 0 0 0 0  Easily annoyed or irritable 0 0 0 0  Afraid - awful might happen 0 0 0 0  Total GAD 7 Score 0 0 0 0  Anxiety Difficulty Not difficult at all Not difficult at all Not difficult at all Not difficult at all       07/09/2023    2:51 PM 03/05/2023    2:08 PM 08/08/2022    3:04 PM  Depression screen PHQ 2/9  Decreased Interest 0 0 0  Down, Depressed, Hopeless 0 0 0  PHQ - 2 Score 0 0 0  Altered sleeping 0 0 0  Tired, decreased energy 0 0 0  Change in appetite 0 0 0  Feeling bad or failure about yourself  0 0 0  Trouble concentrating 0 0 0  Moving slowly or fidgety/restless 0 0 0  Suicidal thoughts 0 0 0  PHQ-9 Score 0 0 0  Difficult doing work/chores Not difficult at all Not difficult at all Not difficult at all    BP Readings from Last 3 Encounters:  07/09/23 (!) 98/58  05/26/23 (!) 184/120  03/05/23 (!) 150/90    Physical Exam Vitals reviewed.  Constitutional:      General: She is not in acute distress.    Appearance: She is not diaphoretic.  HENT:     Head: Normocephalic and atraumatic.     Right Ear: Tympanic membrane, ear canal and external ear normal. There is no impacted cerumen.     Left Ear: Tympanic membrane and  external ear normal. There is no impacted cerumen.     Nose: Nose normal. No congestion.     Mouth/Throat:     Pharynx: Oropharynx is clear.  Eyes:     General:        Right eye: No discharge.        Left eye: No discharge.     Conjunctiva/sclera: Conjunctivae normal.     Pupils: Pupils are equal, round, and reactive to light.  Neck:     Thyroid: No thyromegaly.     Vascular: No JVD.  Cardiovascular:     Rate and Rhythm: Normal rate and regular rhythm.     Heart sounds: Normal heart sounds. No murmur heard.    No friction rub. No gallop.  Pulmonary:     Effort: Pulmonary effort is normal.     Breath sounds: Normal breath sounds. No wheezing, rhonchi or rales.  Abdominal:     General: Bowel sounds are normal.     Palpations: Abdomen is soft. There is no mass.     Tenderness: There is no abdominal tenderness. There is no guarding.  Musculoskeletal:        General: Normal range of motion.     Cervical back: Normal range of motion and neck supple.  Lymphadenopathy:     Cervical: No cervical adenopathy.  Skin:    General: Skin is warm and dry.  Neurological:     Mental Status: She is alert.     Deep Tendon Reflexes: Reflexes are normal and symmetric.     Wt Readings from Last 3 Encounters:  07/09/23 109 lb 3.2 oz (49.5 kg)  05/26/23 116 lb (52.6 kg)  03/05/23 113 lb (51.3 kg)    BP (!) 98/58   Pulse 73   Ht 5\' 5"  (1.651 m)   Wt 109 lb 3.2 oz (49.5 kg)   SpO2 98%   BMI 18.17 kg/m   Assessment and  Plan:  1. Long term current use of insulin (HCC) (Primary) Chronic.  Controlled.  Stable.  Patient is now taking her [place] correctly.  She was seen in the emergency room and coherent due to hypoglycemia because she was taking her Evaristo Bury twice a day instead of once a day.  She is currently to be taking her Evaristo Bury once a day 7 units in the morning.  Afterwards she takes her blood sugar and has a sliding scale on Humalog which means that she takes 4 units in the morning 4  units at lunch and 4 units with her evening meal depending on if her blood sugar is above 150 units otherwise she does not take anything at all.  We will get a renal function panel today if we can if not patient is returning on Monday and we will get the lab work at that time.  Patient is to bring all her medications with her. - Renal Function Panel  2. Primary hypertension Patient has primary hypertension followed by cardiology at [facility] clinic.  She is uncertain what medications she is on and it appears that she is on arise she will be bringing these in for me to be able to justify and put down.  Patient has a history of CKD 3B for which she is followed.  We will recheck with renal function panel today and if not today upon refill he - Renal Function Panel  3. CKD stage 3b, GFR 30-44 ml/min (HCC) Patient has a history of CKD 3B which is chronic.  Patient will return next week with her medications and I have encouraged her to maintain a contact with [facility] cardiology for her hypertension and cardiac meds in the meantime I will be helping her with her diabetic control.  Patient is returning next week for recheck. - Renal Function Panel    Elizabeth Sauer, MD

## 2023-07-10 ENCOUNTER — Telehealth: Payer: Self-pay

## 2023-07-10 ENCOUNTER — Encounter: Payer: Self-pay | Admitting: Family Medicine

## 2023-07-10 DIAGNOSIS — E785 Hyperlipidemia, unspecified: Secondary | ICD-10-CM | POA: Diagnosis not present

## 2023-07-10 DIAGNOSIS — Z794 Long term (current) use of insulin: Secondary | ICD-10-CM | POA: Diagnosis not present

## 2023-07-10 DIAGNOSIS — T501X5A Adverse effect of loop [high-ceiling] diuretics, initial encounter: Secondary | ICD-10-CM | POA: Diagnosis not present

## 2023-07-10 DIAGNOSIS — R051 Acute cough: Secondary | ICD-10-CM | POA: Diagnosis not present

## 2023-07-10 DIAGNOSIS — I2510 Atherosclerotic heart disease of native coronary artery without angina pectoris: Secondary | ICD-10-CM | POA: Diagnosis not present

## 2023-07-10 DIAGNOSIS — N2889 Other specified disorders of kidney and ureter: Secondary | ICD-10-CM | POA: Diagnosis not present

## 2023-07-10 DIAGNOSIS — E119 Type 2 diabetes mellitus without complications: Secondary | ICD-10-CM | POA: Diagnosis not present

## 2023-07-10 DIAGNOSIS — Z85118 Personal history of other malignant neoplasm of bronchus and lung: Secondary | ICD-10-CM | POA: Diagnosis not present

## 2023-07-10 DIAGNOSIS — Z888 Allergy status to other drugs, medicaments and biological substances status: Secondary | ICD-10-CM | POA: Diagnosis not present

## 2023-07-10 DIAGNOSIS — E11649 Type 2 diabetes mellitus with hypoglycemia without coma: Secondary | ICD-10-CM | POA: Diagnosis not present

## 2023-07-10 DIAGNOSIS — Z8673 Personal history of transient ischemic attack (TIA), and cerebral infarction without residual deficits: Secondary | ICD-10-CM | POA: Diagnosis not present

## 2023-07-10 DIAGNOSIS — Z91148 Patient's other noncompliance with medication regimen for other reason: Secondary | ICD-10-CM | POA: Diagnosis not present

## 2023-07-10 DIAGNOSIS — I739 Peripheral vascular disease, unspecified: Secondary | ICD-10-CM | POA: Diagnosis not present

## 2023-07-10 DIAGNOSIS — K5900 Constipation, unspecified: Secondary | ICD-10-CM | POA: Diagnosis not present

## 2023-07-10 DIAGNOSIS — Z5982 Transportation insecurity: Secondary | ICD-10-CM | POA: Diagnosis not present

## 2023-07-10 DIAGNOSIS — E1165 Type 2 diabetes mellitus with hyperglycemia: Secondary | ICD-10-CM | POA: Diagnosis not present

## 2023-07-10 DIAGNOSIS — N1832 Chronic kidney disease, stage 3b: Secondary | ICD-10-CM | POA: Diagnosis not present

## 2023-07-10 DIAGNOSIS — N189 Chronic kidney disease, unspecified: Secondary | ICD-10-CM | POA: Diagnosis not present

## 2023-07-10 DIAGNOSIS — I10 Essential (primary) hypertension: Secondary | ICD-10-CM | POA: Diagnosis not present

## 2023-07-10 DIAGNOSIS — Z902 Acquired absence of lung [part of]: Secondary | ICD-10-CM | POA: Diagnosis not present

## 2023-07-10 DIAGNOSIS — E872 Acidosis, unspecified: Secondary | ICD-10-CM | POA: Diagnosis not present

## 2023-07-10 DIAGNOSIS — Z635 Disruption of family by separation and divorce: Secondary | ICD-10-CM | POA: Diagnosis not present

## 2023-07-10 DIAGNOSIS — I5022 Chronic systolic (congestive) heart failure: Secondary | ICD-10-CM | POA: Diagnosis not present

## 2023-07-10 DIAGNOSIS — Z89511 Acquired absence of right leg below knee: Secondary | ICD-10-CM | POA: Diagnosis not present

## 2023-07-10 DIAGNOSIS — D631 Anemia in chronic kidney disease: Secondary | ICD-10-CM | POA: Diagnosis not present

## 2023-07-10 DIAGNOSIS — I130 Hypertensive heart and chronic kidney disease with heart failure and stage 1 through stage 4 chronic kidney disease, or unspecified chronic kidney disease: Secondary | ICD-10-CM | POA: Diagnosis not present

## 2023-07-10 DIAGNOSIS — R931 Abnormal findings on diagnostic imaging of heart and coronary circulation: Secondary | ICD-10-CM | POA: Diagnosis not present

## 2023-07-10 DIAGNOSIS — E1122 Type 2 diabetes mellitus with diabetic chronic kidney disease: Secondary | ICD-10-CM | POA: Diagnosis not present

## 2023-07-10 DIAGNOSIS — E860 Dehydration: Secondary | ICD-10-CM | POA: Diagnosis not present

## 2023-07-10 DIAGNOSIS — I5020 Unspecified systolic (congestive) heart failure: Secondary | ICD-10-CM | POA: Diagnosis not present

## 2023-07-10 DIAGNOSIS — Z87891 Personal history of nicotine dependence: Secondary | ICD-10-CM | POA: Diagnosis not present

## 2023-07-10 DIAGNOSIS — N179 Acute kidney failure, unspecified: Secondary | ICD-10-CM | POA: Diagnosis not present

## 2023-07-10 DIAGNOSIS — Z1152 Encounter for screening for COVID-19: Secondary | ICD-10-CM | POA: Diagnosis not present

## 2023-07-10 DIAGNOSIS — E1169 Type 2 diabetes mellitus with other specified complication: Secondary | ICD-10-CM | POA: Diagnosis not present

## 2023-07-10 LAB — RENAL FUNCTION PANEL
Albumin: 3.1 g/dL — ABNORMAL LOW (ref 3.9–4.9)
BUN/Creatinine Ratio: 17 (ref 12–28)
BUN: 76 mg/dL (ref 8–27)
CO2: 19 mmol/L — ABNORMAL LOW (ref 20–29)
Calcium: 7.2 mg/dL — ABNORMAL LOW (ref 8.7–10.3)
Chloride: 100 mmol/L (ref 96–106)
Creatinine, Ser: 4.51 mg/dL — ABNORMAL HIGH (ref 0.57–1.00)
Glucose: 392 mg/dL — ABNORMAL HIGH (ref 70–99)
Phosphorus: 5.5 mg/dL — ABNORMAL HIGH (ref 3.0–4.3)
Potassium: 4.3 mmol/L (ref 3.5–5.2)
Sodium: 135 mmol/L (ref 134–144)
eGFR: 10 mL/min/{1.73_m2} — ABNORMAL LOW (ref >59)

## 2023-07-10 NOTE — Telephone Encounter (Signed)
Tried calling patient. No answer. Left VM. Called patient next contact who is her cousin Denice Paradise. She said she did speak to patient and they called EMS to transport patient to the ER at [facility]. She is going today per Iris. Also, informed Iris that the patient is on the schedule for an appt with Dr Yetta Barre on 01/27 for Diabetes and patient needs to be seen by Endocrinology for her Diabetes per Dr Yetta Barre. She was seeing [facility] per her chart in 2023.   Iris verbalized understanding. Will cancel appt.   - Catarina Huntley

## 2023-07-13 ENCOUNTER — Telehealth: Payer: Self-pay

## 2023-07-13 NOTE — Transitions of Care (Post Inpatient/ED Visit) (Signed)
   07/13/2023  Name: [name] [name] MRN: [id number] DOB: Mar 09, 1956  Today's TOC FU Call Status: Today's TOC FU Call Status:: Unsuccessful Call (1st Attempt) Unsuccessful Call (1st Attempt) Date: 07/13/23  Attempted to reach the patient regarding the most recent Inpatient/ED visit.  Follow Up Plan: Additional outreach attempts will be made to reach the patient to complete the Transitions of Care (Post Inpatient/ED visit) call.   Lonia Chimera, RN, BSN, CEN [employer]- Transition of Care Team.  [facility] [phone]

## 2023-07-16 ENCOUNTER — Telehealth: Payer: Self-pay

## 2023-07-16 ENCOUNTER — Other Ambulatory Visit: Payer: Self-pay

## 2023-07-16 ENCOUNTER — Ambulatory Visit: Payer: 59 | Admitting: Family Medicine

## 2023-07-16 ENCOUNTER — Telehealth: Payer: Self-pay | Admitting: Family Medicine

## 2023-07-16 DIAGNOSIS — E1151 Type 2 diabetes mellitus with diabetic peripheral angiopathy without gangrene: Secondary | ICD-10-CM

## 2023-07-16 NOTE — Telephone Encounter (Signed)
Referral placed.

## 2023-07-16 NOTE — Telephone Encounter (Signed)
Patient came into office today for an appt but I looked and explained that it had been cancelled and explained to pt why it was cancelled. Patient stated that Dr. Yetta Barre said she would handle her DM, and now Patient wants Dr. Yetta Barre to give her call when she gets time. I did also explain to pt that she is to see a endo

## 2023-07-16 NOTE — Patient Instructions (Signed)
Visit Information  Thank you for taking time to visit with me today. Please don't hesitate to contact me if I can be of assistance to you before our next scheduled telephone appointment.  Our next appointment is by telephone on 07/24/23  at 1:00pm   Following is a copy of your care plan:   Goals Addressed             This Visit's Progress    TOC Care Plan       Current Barriers:  Medication management medication changes  Provider appointments with PCP and cardiology Transportation Thru [employer] and [employer] benefit  Chronic Disease Management support and education needs related to ESRD  Transportation barriers  RNCM Clinical Goal(s):  Patient will work with the Care Management team over the next 30 days to address Transition of Care Barriers: Medication Management Provider appointments Transportation take all medications exactly as prescribed and will call provider for medication related questions as evidenced by no missed medication doses  attend all scheduled medical appointments: with CP, Cardiology  as evidenced by no missed follow up visits   through collaboration with RN Care manager, provider, and care team.   Interventions: Evaluation of current treatment plan related to  self management and patient's adherence to plan as established by provider  Transitions of Care:  New goal. Doctor Visits  - discussed the importance of doctor visits Post discharge activity limitations prescribed by provider reviewed Reviewed Signs and symptoms of infection   Chronic Kidney Disease Interventions:  (Status:  New goal.) Short Term Goal Evaluation of current treatment plan related to chronic kidney disease self management and patient's adherence to plan as established by provider      Reviewed prescribed diet Reg Heart Healthy Renal  Reviewed medications with patient and discussed importance of compliance    Reviewed scheduled/upcoming provider appointments including    Advised patient to  discuss medications  with provider    Discussed plans with patient for ongoing care management follow up and provided patient with direct contact information for care management team    Assessed social determinant of health barriers    Support coping and stress management by recognizing current strategies and assist in developing new strategies such as mindfulness, journaling, relaxation techniques, problem-solving    Last practice recorded BP readings:  BP Readings from Last 3 Encounters:  07/09/23 (!) 98/58  05/26/23 (!) 184/120  03/05/23 (!) 150/90   Most recent eGFR/CrCl:  Lab Results  Component Value Date   EGFR 10 (L) 07/09/2023    No components found for: "CRCL"  Patient Goals/Self-Care Activities: Participate in Transition of Care Program/Attend TOC scheduled calls Take all medications as prescribed Attend all scheduled provider appointments Call pharmacy for medication refills 3-7 days in advance of running out of medications Attend church or other social activities Perform all self care activities independently  Perform IADL's (shopping, preparing meals, housekeeping, managing finances) independently Call provider office for new concerns or questions   Follow Up Plan:  Telephone follow up appointment with care management team member scheduled for:  07/24/23 @ 1:00pm  The patient has been provided with contact information for the care management team and has been advised to call with any health related questions or concerns.          Medication review  Reviewed current home medications -- provided education as needed. Patient is aware of potential side effects and was encouraged to notify PCP for any adverse side effects or unwanted symptoms not relieved  with interventions Patient will call 911 for Medical Emergencies or Life -Threatening Symptoms.  Reviewed goals for care Patient/ Caregiver verbalizes understanding of instructions with the plan of care . The  Patient /  Caregiver was encouraged to make informed decisions about care, actively participate in managing health conditions, and implement lifestyle changes as needed to promote independence and self-management of healthcare. SDOH screenings have been completed and addressed if indicted There are no reported barriers to care.    Follow-up Plan [facility] Case Management Nurse will provide follow-up and on-going assessment ,evaluation and education of disease processes, recommended interventions for both chronic and acute medical conditions ,  along with ongoing review of symptoms ,medication reviews / reconciliation during each weekly call . Any updates , inconsistencies, discrepancies or acute care concerns will be addressed and routed to the correct Practitioner if indicated   [facility]  Please call the care guide team at [phone]  if you need to cancel or reschedule your appointment . For scheduled calls -Three attempts will be made to reach you -if the scheduled call is missed or  we are unable to reach the you after 3 attempts no additional outreach attempts will be made and the TOC follow-up will be closed .   If you need to speak to a Nurse you may  call me directly at the number below or if I am unavailable,and  your need is urgent  please call the main [facility] number at [phone] and ask to speak with one of the [facility] ( Transition of Care )  Nurses  .  Patient was encouraged to Contact PCP with any questions or concerns regarding ongoing medical care, any difficulty obtaining or picking up prescriptions, any changes or worsening in condition including signs / symptoms not relieved  with interventions                                                                               Additionally, If you experience worsening of your symptoms, develop shortness of breath, If you are experiencing a medical emergency,  develop suicidal or homicidal thoughts you must seek medical attention immediately  by calling 911 or report to your local emergency department or urgent care.   If you have a non-emergency medical problem during routine business hours, please contact your provider's office and ask to speak with a nurse.       Please take the time to read instructions/literature along with the possible adverse reactions/side effects for all the Medicines that have been prescribed to you. Only take newly prescribed  Medications after you have completely understood and accept all the possible adverse reactions/side effects.   Do not take more than prescribed Medications for  Pain, Sleep and Anxiety. Do not drive when taking Pain medications or sleep aid/ insomnia  medications It is not advisable to combine anxiety, sleep and pain medications without talking with your primary care practitioner    If you are experiencing a Mental Health or Behavioral Health Crisis or need someone to talk to Please call the Suicide and Crisis Lifeline: 988 You may also call the [place] National Suicide Prevention Lifeline: 972-299-2209 or TTY: 418-682-6089 TTY 806-034-7183) to talk  to a trained counselor.  You may call the Behavioral Health Crisis Line at 516-744-5885, at any time, 24 hours a day, 7 days a week- however If you are in danger or need immediate medical attention, call 911.   If you would like help to quit smoking, call 1-800-QUIT-NOW ( 713 234 1830) OR Espaol: 1-855-Djelo-Ya (5-621-308-6578) o para ms informacin haga clic aqu or Text READY to 469-629 to register via text.   Susa Loffler , BSN, RN [place]   [facility] RN Care Manager Direct Dial [phone]  Website: [email or web address]

## 2023-07-16 NOTE — Transitions of Care (Post Inpatient/ED Visit) (Signed)
07/16/2023  Name: [name] [name] MRN: [id number] DOB: 1956-05-25  Today's TOC FU Call Status: Today's TOC FU Call Status:: Successful TOC FU Call Completed TOC FU Call Complete Date: 07/16/23 Patient's Name and Date of Birth confirmed.  Transition Care Management Follow-up Telephone Call Date of Discharge: 07/12/23 Discharge Facility: Other ([facility]) Name of [facility] ([facility]) [facility]charge Facility: [facility] Type of Discharge: Inpatient Admission Primary Inpatient Discharge Diagnosis:: Acute cough AKI (acute kidney injury) Acute kidney injury superimposed on CKD How have you been since you were released from the hospital?: Better Any questions or concerns?: No  Items Reviewed: Did you receive and understand the discharge instructions provided?: Yes Medications obtained,verified, and reconciled?: Yes (Medications Reviewed) (Medication reconciliation completed based on recent discharge summary Patient taking medications as instructed and is aware of any changes or dosage adjustments medication regimen. Patient denies questions and reports no barriers to medication adherence) Any new allergies since your discharge?: No Dietary orders reviewed?: Yes Type of Diet Ordered:: Heart Healthy Carb Modified Do you have support at home?: Yes People in Home: alone Name of Support/Comfort Primary Source: Family and friends Rosetta and Iris  Medications Reviewed Today: Medications Reviewed Today     Reviewed by Johnnette Barrios, RN (Registered Nurse) on 07/16/23 at 1249  Med List Status: <None>   Medication Order Taking? Sig Documenting Provider Last Dose Status Informant  ACCU-CHEK GUIDE test strip [id number] Yes USE UP TO 4 TIMES A DAY AS DIRECTED Duanne Limerick, MD Taking Active   Accu-Chek Softclix Lancets lancets [id number] Yes SMARTSIG:Topical 1-4 Times Daily [provider] Taking Active Self  amLODipine (NORVASC) 10 MG tablet [id number] Yes Take 10 mg by mouth daily.  [provider] Taking Active   aspirin EC 81 MG tablet [id number] Yes Take 81 mg by mouth in the morning. Swallow whole. [provider] Taking Active Self  atorvastatin (LIPITOR) 20 MG tablet [id number] Yes Take 20 mg by mouth daily. [provider] Taking Active   blood glucose meter kit and supplies KIT [id number] Yes Dispense based on patient and insurance preference. Use up to four times daily as directed. Duanne Limerick, MD Taking Active Self  carboxymethylcellulose (REFRESH PLUS) 0.5 % SOLN [id number] Yes Place 1 drop into both eyes in the morning and at bedtime. [provider] Taking Active Self  carvedilol (COREG) 25 MG tablet [id number] Yes Take 25 mg by mouth 2 (two) times daily with a meal. [provider] Taking Active   diphenhydrAMINE (BENADRYL) 25 MG tablet [id number] Yes Take 25 mg by mouth every 6 (six) hours as needed. [provider] Taking Active   DULoxetine (CYMBALTA) 20 MG capsule [id number] Yes Take 2 capsules (40 mg total) by mouth 2 (two) times daily. Sharman Cheek, MD Taking Active   HUMALOG KWIKPEN 100 UNIT/ML KwikPen [id number] Yes Inject 8 Units into the skin 3 (three) times daily after meals. Duanne Limerick, MD Taking Active            Med Note Sharon Seller, Connell Bognar L   Mon Jul 16, 2023 12:46 PM) New directions 4 units  B-L-D  hydrALAZINE (APRESOLINE) 100 MG tablet [id number] Yes Take 100 mg by mouth 3 (three) times daily. [provider] Taking Active   insulin degludec (TRESIBA) 100 UNIT/ML FlexTouch Pen [id number] No Inject 10 Units into the skin 2 (two) times daily.  Patient not taking: Reported on 07/16/2023   [provider] Not Taking Active   isosorbide dinitrate (ISORDIL) 30 MG tablet  [id number] Yes TAKE 1 TABLET BY MOUTH THREE TIMES A DAY Duanne Limerick, MD Taking Active   ketoconazole (NIZORAL) 2 % cream [id number] Yes APPLY TO FEET TOPICALLY IN THE MORNING AND AT BED DAILY Duanne Limerick, MD  Taking Active   metFORMIN (GLUCOPHAGE-XR) 500 MG 24 hr tablet [id number] Yes TAKE 1 TABLET BY MOUTH TWICE A DAY Duanne Limerick, MD Taking Active Self  metoprolol succinate (TOPROL-XL) 50 MG 24 hr tablet [id number] Yes Take 1 tablet (50 mg total) by mouth daily. Take with or immediately following a meal. Sharman Cheek, MD Taking Active   Multiple Vitamin (MULTIVITAMIN WITH MINERALS) TABS tablet [id number] Yes Take 1 tablet by mouth in the morning. Adults 50+ [provider] Taking Active Self  ondansetron (ZOFRAN) 8 MG tablet [id number] Yes TAKE 1 TABLET 2 TIMES DAILY AS NEEDED (NAUSEA/VOMITING). START IF NEEDED ON 3RD DAY AFTER CISPLATIN. Rickard Patience, MD Taking Active   pantoprazole (PROTONIX) 40 MG tablet [id number] Yes Take 1 tablet (40 mg total) by mouth daily. Sharman Cheek, MD Taking Active   potassium chloride SA (KLOR-CON M20) 20 MEQ tablet [id number] Yes TAKE 1 TABLET BY MOUTH EVERY DAY Duanne Limerick, MD Taking Active   prochlorperazine (COMPAZINE) 10 MG tablet [id number] Yes TAKE 1 TABLET (10 MG TOTAL) BY MOUTH EVERY 6 (SIX) HOURS AS NEEDED (NAUSEA OR VOMITING). Rickard Patience, MD Taking Active   sodium bicarbonate 650 MG tablet [id number] Yes Take 650 mg by mouth 3 (three) times daily. [provider] Taking Active   spironolactone (ALDACTONE) 25 MG tablet [id number] Yes Take 1 tablet (25 mg total) by mouth daily. Sharman Cheek, MD Taking Active   torsemide [facility]) 20 MG tablet [id number] Yes Take 80 mg by mouth daily. Take 4 20 mg tabs = 80 mg [provider] Taking Active   Vitamin D, Ergocalciferol, (DRISDOL) 1.25 MG (50000 UNIT) CAPS capsule [id number] Yes Take 50,000 Units by mouth every 7 (seven) days. 1 x week for 8 doses [provider] Taking Active           Medication reconciliation / review completed based on most recent discharge summary and EHR medication list. Confirmed patient is taking all newly prescribed medications as instructed (any  discrepancies are noted in review section)   Patient / Caregiver is aware of any changes to and / or  any dosage adjustments to medication regimen. Patient/ Caregiver denies questions at this time and reports no barriers to medication adherence.    Home Care and Equipment/Supplies: Were Home Health Services Ordered?: No Any new equipment or medical supplies ordered?: No  Functional Questionnaire: Do you need assistance with bathing/showering or dressing?: No Do you need assistance with meal preparation?: No Do you need assistance with eating?: No Do you have difficulty maintaining continence: No Do you need assistance with getting out of bed/getting out of a chair/moving?: No Do you have difficulty managing or taking your medications?: No  Follow up appointments reviewed: PCP Follow-up appointment confirmed?: Yes Date of PCP follow-up appointment?: 07/16/23 Follow-up Provider: Duanne Limerick, MD Specialist Hospital Follow-up appointment confirmed?: Yes Date of Specialist follow-up appointment?: 07/25/23 Follow-Up Specialty Provider:: 07/25/2023 10:00 AM Lockie Mola, MD 39F/2G CARD [facility] Do you need transportation to your follow-up appointment?: No (Uses [employer]) Do you understand care options if your condition(s) worsen?: Yes-patient verbalized understanding  SDOH Interventions Today    Flowsheet Row Most Recent Value  SDOH Interventions   Food Insecurity Interventions Intervention Not Indicated  Housing  Interventions Intervention Not Indicated  Transportation Interventions Patient Resources (Friends/Family), Payor Benefit  [[employer] and [employer]]  Utilities Interventions Intervention Not Indicated  Social Connections Interventions Intervention Not Indicated     Patient is at high risk for readmission and/or has history of  high utilization  Discussed VBCI  TOC program and weekly calls to patient to assess condition/status, medication management   and provide support/education as indicated . Patient/ Caregiver voiced understanding and is  agreeable to 30 day program    Routine follow-up and on-going assessment evaluation and education of disease processes, recommended interventions for both chronic and acute medical conditions , will occur during each weekly visit along with ongoing review of symptoms ,medication reviews and reconciliation. Any updates , inconsistencies, discrepancies or acute care concerns will be addressed and routed to the correct Practitioner if indicated   Based on current information and Insurance plan -Reviewed benefits available to patient, including details about eligibility options for care if any area of needs were identified.  Reviewed patients ability to access and / or navigating the benefits system..Amb Referral made if indicted , refer to orders section of note for details   Please refer to Care Plan for goals and interventions -Effectiveness of interventions, symptom management and outcomes will be evaluated  weekly during [facility] 30-day Program Outreach calls  . Any necessary  changes and updates to Care Plan will be completed episodically    Reviewed goals for care Patient verbalizes understanding of instructions and care plan provided. Patient was encouraged to make informed decisions about their care, actively participate in managing their health condition, and implement lifestyle changes as needed to promote independence and self-management of health care   The patient has been provided with contact information for the care management team and has been advised to call with any health-related questions or concerns. Follow up as indicated with Care Team , or sooner should any new problems arise.   Susa Loffler , BSN, RN [facility]   VBCI-Population Health RN Care Manager Direct Dial [phone]  Fax: [phone] Website: [email or web address]

## 2023-07-16 NOTE — Telephone Encounter (Signed)
Copied from CRM [id number]. Topic: General - Other >> Jul 16, 2023  3:48 PM Macon Large wrote: Reason for CRM: Pt called back and asked that Dr. Yetta Barre refer her to the Endocrinologist in [place] since she will not treat or prescribe mediation for diabetes.

## 2023-07-16 NOTE — Telephone Encounter (Signed)
Called patient and left a VM informing her that I spoke with Dr Yetta Barre and she said she will not be taking over patients diabetes management. This is supposed to be treated by her Endocrinologist in [place]. If she needs a new referral for Endo, we will be glad to put this in for her but Dr Yetta Barre said she will not be treating or prescribing diabetes medication for this patient.  - Ariela Mochizuki

## 2023-07-24 ENCOUNTER — Other Ambulatory Visit: Payer: Self-pay

## 2023-07-24 NOTE — Patient Outreach (Signed)
  Care Management  Transitions of Care Program Transitions of Care Post-discharge week 2  07/24/2023 Name: [name] [name] MRN: [id number] DOB: [birth year]  Subjective: [name] [name] is a 68 y.o. year old female who is a primary care patient of Joshua Cathryne BROCKS, MD. The Care Management team was unable to reach the patient by phone to assess and address transitions of care needs. Patient was called in an Outreach attempt during VBCI  30-day TOC program. Unfortunately, I was not able to speak with the patient in regards to recent hospital    Patient's voicemail has  a generic greeting. To maintain HIPAA compliance, left message with VBCI CM contact information only  and a request for a call back .   Plan: Additional outreach attempts will be made to reach the patient enrolled in the [facility] Program (Post Inpatient/ED Visit).  Bari Mayans , BSN, RN [facility]   VBCI-Population Health RN Care Manager Direct Dial [phone]  Fax: [phone] Website: [email or web address]

## 2023-07-25 ENCOUNTER — Telehealth: Payer: Self-pay

## 2023-07-25 NOTE — Patient Instructions (Signed)
 Visit Information  Thank you for taking time to visit with me today. Please don't hesitate to contact me if I can be of assistance to you before our next scheduled telephone appointment.  Our next appointment is by telephone on 08/02/23 at 1:00pm  Following is a copy of your care plan:   Goals Addressed             This Visit's Progress    TOC Care Plan       Current Barriers:  Medication management medication changes  Provider appointments with PCP and cardiology-she attempted 2 visits with PCP 1/20 and 1/27 Has endo 3/4 to address DM  Transportation Thru [employer] and [employer] benefit  Chronic Disease Management support and education needs related to ESRD  Transportation barriers  RNCM Clinical Goal(s):  Patient will work with the Care Management team over the next 30 days to address Transition of Care Barriers: Medication Management Provider appointments Transportation take all medications exactly as prescribed and will call provider for medication related questions as evidenced by no missed medication doses  attend all scheduled medical appointments: with CP, Cardiology  as evidenced by no missed follow up visits   through collaboration with RN Care manager, provider, and care team.   Interventions: Evaluation of current treatment plan related to  self management and patient's adherence to plan as established by provider  Transitions of Care:  Goal on track:  Yes. Doctor Visits  - discussed the importance of doctor visits Post discharge activity limitations prescribed by provider reviewed Reviewed Signs and symptoms of infection   Chronic Kidney Disease Interventions:  (Status:  Goal on track:  Yes.) Short Term Goal Evaluation of current treatment plan related to chronic kidney disease self management and patient's adherence to plan as established by provider      Reviewed prescribed diet Reg Heart Healthy Renal  Reviewed medications with patient and discussed importance of  compliance    Reviewed scheduled/upcoming provider appointments including    Advised patient to discuss medications  with provider    Discussed plans with patient for ongoing care management follow up and provided patient with direct contact information for care management team    Assessed social determinant of health barriers    Support coping and stress management by recognizing current strategies and assist in developing new strategies such as mindfulness, journaling, relaxation techniques, problem-solving    Last practice recorded BP readings:  BP Readings from Last 3 Encounters:  07/09/23 (!) 98/58  05/26/23 (!) 184/120  03/05/23 (!) 150/90   Most recent eGFR/CrCl:  Lab Results  Component Value Date   EGFR 10 (L) 07/09/2023    No components found for: CRCL  Patient Goals/Self-Care Activities: Participate in Transition of Care Program/Attend TOC scheduled calls Take all medications as prescribed Attend all scheduled provider appointments Call pharmacy for medication refills 3-7 days in advance of running out of medications Attend church or other social activities Perform all self care activities independently  Perform IADL's (shopping, preparing meals, housekeeping, managing finances) independently Call provider office for new concerns or questions   Follow Up Plan:  Telephone follow up appointment with care management team member scheduled for:  08/02/23 @ 1:00pm  The patient has been provided with contact information for the care management team and has been advised to call with any health related questions or concerns.          Medication review  Reviewed current home medications -- provided education as needed. Patient is aware of  potential side effects and was encouraged to notify PCP for any adverse side effects or unwanted symptoms not relieved with interventions  Patient will call 911 for Medical Emergencies or Life -Threatening Symptoms.  Reviewed goals for  care Patient/ Caregiver verbalizes understanding of instructions with the plan of care . The  Patient / Caregiver was encouraged to make informed decisions about care, actively participate in managing health conditions, and implement lifestyle changes as needed to promote independence and self-management of healthcare. SDOH screenings have been completed and addressed if indicted.  There are no reported barriers to care.    Follow-up Plan VBCI Case Management Nurse will provide follow-up and on-going assessment ,evaluation and education of disease processes, recommended interventions for both chronic and acute medical conditions ,  along with ongoing review of symptoms ,medication reviews / reconciliation during each weekly call . Any updates , inconsistencies, discrepancies or acute care concerns will be addressed and routed to the correct Practitioner if indicated   [facility]  Please call the care guide team at [phone]  if you need to cancel or reschedule your appointment . For scheduled calls -Three attempts will be made to reach you -if the scheduled call is missed or  we are unable to reach the you after 3 attempts no additional outreach attempts will be made and the TOC follow-up will be closed .   If you need to speak to a Nurse you may  call me directly at the number below or if I am unavailable,and  your need is urgent  please call the main VBCI number at [phone] and ask to speak with one of the [facility] ( Transition of Care )  Nurses  .  Patient was encouraged to Contact PCP with any changes in baseline or  medication regimen,  changes in health status  /  well-being, safety concerns, including falls any questions or concerns regarding ongoing medical care, any difficulty obtaining or picking up prescriptions, any changes or worsening in condition- including  symptoms not relieved  with interventions                                                                             Additionally, If you experience worsening of your symptoms, develop shortness of breath, If you are experiencing a medical emergency,  develop suicidal or homicidal thoughts you must seek medical attention immediately by calling 911 or report to your local emergency department or urgent care.   If you have a non-emergency medical problem during routine business hours, please contact your provider's office and ask to speak with a nurse.       Please take the time to read instructions/literature along with the possible adverse reactions/side effects for all the Medicines that have been prescribed to you. Only take newly prescribed  Medications after you have completely understood and accept all the possible adverse reactions/side effects.   Do not take more than prescribed Medications for  Pain, Sleep and Anxiety. Do not drive when taking Pain medications or sleep aid/ insomnia  medications It is not advisable to combine anxiety, sleep and pain medications without talking with your primary care practitioner    If you are experiencing a  Mental Health or Behavioral Health Crisis or need someone to talk to Please call the Suicide and Crisis Lifeline: 83 You may also call the [place]  National Suicide Prevention Lifeline: 225-335-3230 or TTY: (236) 810-6493 TTY 4704815896) to talk to a trained counselor.  You may call the Behavioral Health Crisis Line at 403-236-9676, at any time, 24 hours a day, 7 days a week- however If you are in danger or need immediate medical attention, call 911.   If you would like help to quit smoking, call 1-800-QUIT-NOW ( (234) 781-4671) OR Espaol: 1-855-Djelo-Ya (8-144-664-6430) o para ms informacin haga clic aqu or Text READY to 799-599 to register via text.   Bari Mayans , BSN, RN [place]   VBCI-Population Health RN Care Manager Direct Dial [phone]  Website: [email or web address]

## 2023-07-25 NOTE — Patient Outreach (Signed)
 Care Management  Transitions of Care Program Transitions of Care Post-discharge week 3   07/25/2023 Name: [name] [name] MRN: [id number] DOB: 1956-06-19  Subjective: [name] [name] is a 68 y.o. year old female who is a primary care patient of Joshua Cathryne BROCKS, MD. The Care Management team Engaged with patient Engaged with patient by telephone to assess and address transitions of care needs.   Consent to Services:  Patient was given information about care management services, agreed to services, and gave verbal consent to participate.   Assessment:   Patient/ Caregiver  voices no new complaints or concerns  and has not developed/ reported any new medical issues / Dx or acute changes. - since last follow-up call for most recent  Hospital stay  1/21-1/23 / 2025 She is doing well. She reports she has had no issues and has no concerns She is taking her medication She was nit able o see her PCP for a post hospital visit. She does have a Endocrinologist appt 3/4 to review her DM .  Medication reconciliation/ review completed based on most recent medication list in EHR; and in review of recent Provider follow-up appointments- confirmed patient obtained / is taking all newly prescribed medications as instructed and is aware of any changes to previous medications regimen including dosage adjustments. Patient self-manages medications; denies questions/ concerns or barriers to medication adherence at this time  Patient  educated on red flag s/s to watch for based on current discharge diagnosis and was encouraged to report, any changes in baseline or  medication regimen,   or any new unmanaged side effects or symptoms not relieved with interventions  to PCP and / or the  VBCI Case Management team .          SDOH Interventions    Flowsheet Row Telephone from 07/16/2023 in [place] POPULATION HEALTH DEPARTMENT Telephone from 06/26/2023 in [place] POPULATION HEALTH DEPARTMENT Appointment from 10/24/2022  in [facility] A Dept Of [place]. [facility] Appointment from 10/05/2022 in [facility] - A Dept Of [place]. [facility] Appointment from 08/21/2022 in [facility] - A Dept Of [place]. [facility] Appointment from 08/07/2022 in [facility] A Dept Of [place]. [facility]  SDOH Interventions        Food Insecurity Interventions Intervention Not Indicated Intervention Not Indicated -- -- -- --  Housing Interventions Intervention Not Indicated Intervention Not Indicated -- -- -- --  Transportation Interventions Patient Resources (Friends/Family), Payor Benefit  [[employer] and [employer] Transport] Intervention Not Indicated, Patient Resources (Friends/Family), Payor Benefit  [She uses Advertising Copywriter for [employer] and has support of family and friends at this time] CCAR Merchant Navy Officer ([facility] Only) CCAR [place] ([facility] Only) CCAR [place] ([facility] Only) CCAR [place] ([facility] Only)  Utilities Interventions Intervention Not Indicated Intervention Not Indicated -- -- -- --  Social Connections Interventions Intervention Not Indicated Intervention Not Indicated -- -- -- --        Goals Addressed             This Visit's Progress    TOC Care Plan       Current Barriers:  Medication management medication changes  Provider appointments with PCP and cardiology-she attempted 2 visits with PCP 1/20 and 1/27 Has endo 3/4 to address DM  Transportation Thru [employer] and  [employer] benefit  Chronic Disease Management support and education needs related to ESRD  Transportation barriers  RNCM Clinical Goal(s):  Patient will work with the Care Management team over the next 30 days to address Transition of Care Barriers: Medication Management Provider appointments Transportation take all medications exactly as prescribed and will call provider for medication related questions as evidenced by  no missed medication doses  attend all scheduled medical appointments: with CP, Cardiology  as evidenced by no missed follow up visits   through collaboration with RN Care manager, provider, and care team.   Interventions: Evaluation of current treatment plan related to  self management and patient's adherence to plan as established by provider  Transitions of Care:  Goal on track:  Yes. Doctor Visits  - discussed the importance of doctor visits Post discharge activity limitations prescribed by provider reviewed Reviewed Signs and symptoms of infection   Chronic Kidney Disease Interventions:  (Status:  Goal on track:  Yes.) Short Term Goal Evaluation of current treatment plan related to chronic kidney disease self management and patient's adherence to plan as established by provider      Reviewed prescribed diet Reg Heart Healthy Renal  Reviewed medications with patient and discussed importance of compliance    Reviewed scheduled/upcoming provider appointments including    Advised patient to discuss medications  with provider    Discussed plans with patient for ongoing care management follow up and provided patient with direct contact information for care management team    Assessed social determinant of health barriers    Support coping and stress management by recognizing current strategies and assist in developing new strategies such as mindfulness, journaling, relaxation techniques, problem-solving    Last practice recorded BP readings:  BP Readings from Last 3 Encounters:  07/09/23 (!) 98/58  05/26/23 (!) 184/120  03/05/23 (!) 150/90   Most recent eGFR/CrCl:  Lab Results  Component Value Date   EGFR 10 (L) 07/09/2023    No components found for: CRCL  Patient Goals/Self-Care Activities: Participate in Transition of Care Program/Attend TOC scheduled calls Take all medications as prescribed Attend all scheduled provider appointments Call pharmacy for medication refills 3-7  days in advance of running out of medications Attend church or other social activities Perform all self care activities independently  Perform IADL's (shopping, preparing meals, housekeeping, managing finances) independently Call provider office for new concerns or questions   Follow Up Plan:  Telephone follow up appointment with care management team member scheduled for:  08/02/23 @ 1:00pm  The patient has been provided with contact information for the care management team and has been advised to call with any health related questions or concerns.          Plan:   Routine follow-up and on-going assessment evaluation and education of disease processes, recommended interventions for both chronic and acute medical conditions , will occur during each weekly visit along with ongoing review of symptoms ,medication reviews and reconciliation. Any updates , inconsistencies, discrepancies or acute care concerns will be addressed and routed to the correct Practitioner if indicated   Based on current information and Insurance plan -Reviewed benefits available to patient, including details about eligibility options for care if any area of needs were identified.  Reviewed patients ability to access and / or navigating the benefits system..Amb Referral made if indicted , refer to orders section of note for details   Please refer to Care Plan for goals and interventions -Effectiveness of interventions, symptom management and outcomes  will be evaluated  weekly during [facility] 30-day Program Outreach calls  . Any necessary  changes and updates to Care Plan will be completed episodically    Reviewed goals for care Patient verbalizes understanding of instructions and care plan provided. Patient was encouraged to make informed decisions about their care, actively participate in managing their health condition, and implement lifestyle changes as needed to promote independence and self-management of health care    Patient was encouraged to Contact PCP with any changes in baseline or  medication regimen,  changes in health status  /  well-being, safety concerns, including falls any questions or concerns regarding ongoing medical care, any difficulty obtaining or picking up prescriptions, any changes or worsening in condition- including  symptoms not relieved  with interventions    The patient has been provided with contact information for the care management team and has been advised to call with any health-related questions or concerns. Follow up as indicated with Care Team , or sooner should any new problems arise. Telephone follow up appointment with care management team member scheduled for: 2 /13/25 The patient has been provided with contact information for the care management team and has been advised to call with any health related questions or concerns.   Bari Mayans , BSN, RN [facility]   VBCI-Population Health RN Care Manager Direct Dial [phone]  Fax: [phone] Website: [email or web address]

## 2023-08-02 ENCOUNTER — Other Ambulatory Visit: Payer: Self-pay

## 2023-08-02 NOTE — Patient Outreach (Signed)
  Care Management  Transitions of Care Program Transitions of Care Post-discharge week 3  08/02/2023 Name: [name] MRN: [id number] DOB: 07-01-1955  Subjective: [name] is a 68 y.o. year old female who is a primary care patient of Duanne Limerick, MD. The Care Management team was unable to reach the patient by phone to assess and address transitions of care needs.   Patient  Outreach attempt is in the course of  VBCI  30-day TOC program. Pt previously agreed and is enrolled in the  program due to potential risk for readmission and/or high utilization. Unfortunately, I was not able to speak with the patient in regards to recent hospital discharge      Patient's voicemail has  a generic greeting. To maintain HIPAA compliance, left message including only VBCI CM contact information and a request for a call back .    Plan: Additional outreach attempts will be made to reach the patient enrolled in the [facility] Program (Post Inpatient/ED Visit).  Susa Loffler , BSN, RN [facility]   VBCI-Population Health RN Care Manager Direct Dial [phone]  Fax: [phone] Website: [email or web address]

## 2023-08-07 ENCOUNTER — Encounter: Payer: Self-pay | Admitting: Oncology

## 2023-08-07 ENCOUNTER — Telehealth: Payer: Self-pay

## 2023-08-07 NOTE — Patient Outreach (Signed)
  Care Management  Transitions of Care Program Transitions of Care Post-discharge week 3  08/07/2023 Name: [name] [name] MRN: [id number] DOB: [birth year]  Subjective: [name] [name] is a 69 y.o. year old female who is a primary care patient of Duanne Limerick, MD. The Care Management team was unable to reach the patient by phone to assess and address transitions of care needs.      Patient  Outreach attempt is in the course of  VBCI  30-day TOC program. Pt previously agreed and is enrolled in the  program due to potential risk for readmission and/or high utilization. Unfortunately, I was not able to speak with the patient in regards to recent hospital discharge 2 attempts have been made to reach the patient . Attempt 1  2/13  /2025 Attempt 2   2/18  /2025    Patient's voicemail has  a generic greeting. To maintain HIPAA compliance, left message including only VBCI CM contact information and a request for a call back .  Plan: Additional outreach attempts will be made to reach the patient enrolled in the [facility] Program (Post Inpatient/ED Visit).   Susa Loffler , BSN, RN [facility]   VBCI-Population Health RN Care Manager Direct Dial [phone]  Fax: [phone] Website: [email or web address]

## 2023-08-08 ENCOUNTER — Telehealth: Payer: Self-pay

## 2023-08-08 NOTE — Patient Outreach (Signed)
  Care Management  Transitions of Care Program Transitions of Care Post-discharge week 3  08/08/2023 Name: [name] [name] MRN: [id number] DOB: Jun 16, 1956  Subjective: [name] [name] is a 68 y.o. year old female who is a primary care patient of Duanne Limerick, MD. The Care Management team was unable to reach the patient by phone to assess and address transitions of care needs.   The patient was previously provided with contact information for the [facility] care management team and has been advised to call with any health related questions or concerns.   Three attempts have been made to reach the patient . Attempt 1  08/02/2023 Attempt 2  2/18  /2025 Attempt 3   2/19   /2025  Based on the VBCI program guidelines, if  we are unable to reach the patient  after 3 attempts, no additional outreach attempts will be made and the TOC follow-up will be closed Unfortunately, we have been unable to make contact with the patient for follow up.   The [facility] Case Management Team is available to follow up with the patient after provider conversation with the patient regarding recommendation for care management engagement and subsequent re-referral to the case management team.The VBCI CM team can be reached by calling [phone]. Marland Kitchen    Plan: No Additional outreach attempts will be made to reach the patient enrolled in the [facility] Program (Post Inpatient/ED Visit).  Susa Loffler , BSN, RN [facility]   VBCI-Population Health RN Care Manager Direct Dial [phone]  Fax: [phone] Website: [email or web address]

## 2023-08-16 DIAGNOSIS — N179 Acute kidney failure, unspecified: Secondary | ICD-10-CM | POA: Diagnosis not present

## 2023-08-16 DIAGNOSIS — I5020 Unspecified systolic (congestive) heart failure: Secondary | ICD-10-CM | POA: Diagnosis not present

## 2023-08-16 DIAGNOSIS — I10 Essential (primary) hypertension: Secondary | ICD-10-CM | POA: Diagnosis not present

## 2023-08-16 DIAGNOSIS — D631 Anemia in chronic kidney disease: Secondary | ICD-10-CM | POA: Diagnosis not present

## 2023-08-16 DIAGNOSIS — N185 Chronic kidney disease, stage 5: Secondary | ICD-10-CM | POA: Diagnosis not present

## 2023-08-16 DIAGNOSIS — E1122 Type 2 diabetes mellitus with diabetic chronic kidney disease: Secondary | ICD-10-CM | POA: Diagnosis not present

## 2023-08-16 DIAGNOSIS — Z794 Long term (current) use of insulin: Secondary | ICD-10-CM | POA: Diagnosis not present

## 2023-08-23 ENCOUNTER — Encounter: Payer: Self-pay | Admitting: Oncology

## 2023-08-31 DIAGNOSIS — I5022 Chronic systolic (congestive) heart failure: Secondary | ICD-10-CM | POA: Diagnosis not present

## 2023-08-31 DIAGNOSIS — Z89421 Acquired absence of other right toe(s): Secondary | ICD-10-CM | POA: Diagnosis not present

## 2023-08-31 DIAGNOSIS — E1122 Type 2 diabetes mellitus with diabetic chronic kidney disease: Secondary | ICD-10-CM | POA: Diagnosis not present

## 2023-08-31 DIAGNOSIS — S98911S Complete traumatic amputation of right foot, level unspecified, sequela: Secondary | ICD-10-CM | POA: Diagnosis not present

## 2023-08-31 DIAGNOSIS — I739 Peripheral vascular disease, unspecified: Secondary | ICD-10-CM | POA: Diagnosis not present

## 2023-08-31 DIAGNOSIS — I252 Old myocardial infarction: Secondary | ICD-10-CM | POA: Diagnosis not present

## 2023-08-31 DIAGNOSIS — E785 Hyperlipidemia, unspecified: Secondary | ICD-10-CM | POA: Diagnosis not present

## 2023-08-31 DIAGNOSIS — I10 Essential (primary) hypertension: Secondary | ICD-10-CM | POA: Diagnosis not present

## 2023-08-31 DIAGNOSIS — E1142 Type 2 diabetes mellitus with diabetic polyneuropathy: Secondary | ICD-10-CM | POA: Diagnosis not present

## 2023-08-31 DIAGNOSIS — Z89511 Acquired absence of right leg below knee: Secondary | ICD-10-CM | POA: Insufficient documentation

## 2023-08-31 DIAGNOSIS — E1121 Type 2 diabetes mellitus with diabetic nephropathy: Secondary | ICD-10-CM | POA: Diagnosis not present

## 2023-08-31 DIAGNOSIS — N184 Chronic kidney disease, stage 4 (severe): Secondary | ICD-10-CM | POA: Diagnosis not present

## 2023-08-31 DIAGNOSIS — E1151 Type 2 diabetes mellitus with diabetic peripheral angiopathy without gangrene: Secondary | ICD-10-CM | POA: Diagnosis not present

## 2023-08-31 DIAGNOSIS — I2510 Atherosclerotic heart disease of native coronary artery without angina pectoris: Secondary | ICD-10-CM | POA: Diagnosis not present

## 2023-08-31 DIAGNOSIS — Z794 Long term (current) use of insulin: Secondary | ICD-10-CM | POA: Diagnosis not present

## 2023-09-26 ENCOUNTER — Other Ambulatory Visit (INDEPENDENT_AMBULATORY_CARE_PROVIDER_SITE_OTHER): Payer: Self-pay | Admitting: Nurse Practitioner

## 2023-09-26 DIAGNOSIS — N186 End stage renal disease: Secondary | ICD-10-CM

## 2023-09-27 ENCOUNTER — Other Ambulatory Visit (INDEPENDENT_AMBULATORY_CARE_PROVIDER_SITE_OTHER)

## 2023-09-27 ENCOUNTER — Encounter (INDEPENDENT_AMBULATORY_CARE_PROVIDER_SITE_OTHER): Payer: Self-pay | Admitting: Nurse Practitioner

## 2023-09-27 ENCOUNTER — Encounter (INDEPENDENT_AMBULATORY_CARE_PROVIDER_SITE_OTHER)

## 2023-10-01 DIAGNOSIS — J9811 Atelectasis: Secondary | ICD-10-CM | POA: Diagnosis not present

## 2023-10-01 DIAGNOSIS — C3490 Malignant neoplasm of unspecified part of unspecified bronchus or lung: Secondary | ICD-10-CM | POA: Diagnosis not present

## 2023-10-01 DIAGNOSIS — I5023 Acute on chronic systolic (congestive) heart failure: Secondary | ICD-10-CM | POA: Diagnosis not present

## 2023-10-01 DIAGNOSIS — I130 Hypertensive heart and chronic kidney disease with heart failure and stage 1 through stage 4 chronic kidney disease, or unspecified chronic kidney disease: Secondary | ICD-10-CM | POA: Diagnosis not present

## 2023-10-01 DIAGNOSIS — J90 Pleural effusion, not elsewhere classified: Secondary | ICD-10-CM | POA: Diagnosis not present

## 2023-10-01 DIAGNOSIS — E1122 Type 2 diabetes mellitus with diabetic chronic kidney disease: Secondary | ICD-10-CM | POA: Diagnosis not present

## 2023-10-01 DIAGNOSIS — Z89511 Acquired absence of right leg below knee: Secondary | ICD-10-CM | POA: Diagnosis not present

## 2023-10-01 DIAGNOSIS — I428 Other cardiomyopathies: Secondary | ICD-10-CM | POA: Diagnosis not present

## 2023-10-01 DIAGNOSIS — Z20822 Contact with and (suspected) exposure to covid-19: Secondary | ICD-10-CM | POA: Diagnosis not present

## 2023-10-01 DIAGNOSIS — N189 Chronic kidney disease, unspecified: Secondary | ICD-10-CM | POA: Diagnosis not present

## 2023-10-01 DIAGNOSIS — I110 Hypertensive heart disease with heart failure: Secondary | ICD-10-CM | POA: Diagnosis not present

## 2023-10-01 DIAGNOSIS — N184 Chronic kidney disease, stage 4 (severe): Secondary | ICD-10-CM | POA: Diagnosis not present

## 2023-10-01 DIAGNOSIS — I509 Heart failure, unspecified: Secondary | ICD-10-CM | POA: Diagnosis not present

## 2023-10-01 DIAGNOSIS — R197 Diarrhea, unspecified: Secondary | ICD-10-CM | POA: Diagnosis not present

## 2023-10-01 DIAGNOSIS — I5020 Unspecified systolic (congestive) heart failure: Secondary | ICD-10-CM | POA: Diagnosis not present

## 2023-10-01 DIAGNOSIS — Z833 Family history of diabetes mellitus: Secondary | ICD-10-CM | POA: Diagnosis not present

## 2023-10-01 DIAGNOSIS — J810 Acute pulmonary edema: Secondary | ICD-10-CM | POA: Diagnosis not present

## 2023-10-01 DIAGNOSIS — Z79899 Other long term (current) drug therapy: Secondary | ICD-10-CM | POA: Diagnosis not present

## 2023-10-01 DIAGNOSIS — R0603 Acute respiratory distress: Secondary | ICD-10-CM | POA: Diagnosis not present

## 2023-10-01 DIAGNOSIS — K219 Gastro-esophageal reflux disease without esophagitis: Secondary | ICD-10-CM | POA: Diagnosis not present

## 2023-10-01 DIAGNOSIS — R110 Nausea: Secondary | ICD-10-CM | POA: Diagnosis not present

## 2023-10-01 DIAGNOSIS — Z8673 Personal history of transient ischemic attack (TIA), and cerebral infarction without residual deficits: Secondary | ICD-10-CM | POA: Diagnosis not present

## 2023-10-01 DIAGNOSIS — I2510 Atherosclerotic heart disease of native coronary artery without angina pectoris: Secondary | ICD-10-CM | POA: Diagnosis not present

## 2023-10-01 DIAGNOSIS — R0989 Other specified symptoms and signs involving the circulatory and respiratory systems: Secondary | ICD-10-CM | POA: Diagnosis not present

## 2023-10-01 DIAGNOSIS — E1151 Type 2 diabetes mellitus with diabetic peripheral angiopathy without gangrene: Secondary | ICD-10-CM | POA: Diagnosis not present

## 2023-10-01 DIAGNOSIS — I5043 Acute on chronic combined systolic (congestive) and diastolic (congestive) heart failure: Secondary | ICD-10-CM | POA: Diagnosis not present

## 2023-10-01 DIAGNOSIS — Z91148 Patient's other noncompliance with medication regimen for other reason: Secondary | ICD-10-CM | POA: Diagnosis not present

## 2023-10-01 DIAGNOSIS — R0902 Hypoxemia: Secondary | ICD-10-CM | POA: Diagnosis not present

## 2023-10-01 DIAGNOSIS — Z794 Long term (current) use of insulin: Secondary | ICD-10-CM | POA: Diagnosis not present

## 2023-10-02 DIAGNOSIS — I130 Hypertensive heart and chronic kidney disease with heart failure and stage 1 through stage 4 chronic kidney disease, or unspecified chronic kidney disease: Secondary | ICD-10-CM | POA: Diagnosis not present

## 2023-10-02 DIAGNOSIS — N184 Chronic kidney disease, stage 4 (severe): Secondary | ICD-10-CM | POA: Diagnosis not present

## 2023-10-02 DIAGNOSIS — I5023 Acute on chronic systolic (congestive) heart failure: Secondary | ICD-10-CM | POA: Diagnosis not present

## 2023-10-02 DIAGNOSIS — I5020 Unspecified systolic (congestive) heart failure: Secondary | ICD-10-CM | POA: Diagnosis not present

## 2023-10-02 DIAGNOSIS — I110 Hypertensive heart disease with heart failure: Secondary | ICD-10-CM | POA: Diagnosis not present

## 2023-10-09 DIAGNOSIS — N185 Chronic kidney disease, stage 5: Secondary | ICD-10-CM | POA: Diagnosis not present

## 2023-10-09 DIAGNOSIS — I10 Essential (primary) hypertension: Secondary | ICD-10-CM | POA: Diagnosis not present

## 2023-10-09 DIAGNOSIS — I509 Heart failure, unspecified: Secondary | ICD-10-CM | POA: Diagnosis not present

## 2023-10-09 DIAGNOSIS — D631 Anemia in chronic kidney disease: Secondary | ICD-10-CM | POA: Diagnosis not present

## 2023-10-15 ENCOUNTER — Other Ambulatory Visit: Payer: Self-pay | Admitting: Family Medicine

## 2023-10-15 DIAGNOSIS — B353 Tinea pedis: Secondary | ICD-10-CM

## 2023-10-15 DIAGNOSIS — D631 Anemia in chronic kidney disease: Secondary | ICD-10-CM | POA: Diagnosis not present

## 2023-10-15 DIAGNOSIS — N184 Chronic kidney disease, stage 4 (severe): Secondary | ICD-10-CM | POA: Diagnosis not present

## 2023-10-15 DIAGNOSIS — I10 Essential (primary) hypertension: Secondary | ICD-10-CM | POA: Diagnosis not present

## 2023-10-15 DIAGNOSIS — N2581 Secondary hyperparathyroidism of renal origin: Secondary | ICD-10-CM | POA: Diagnosis not present

## 2023-10-17 NOTE — Telephone Encounter (Signed)
 Requested medication (s) are due for refill today: yes  Requested medication (s) are on the active medication list: yes  Last refill:  03/15/23 60 g 1 RF  Future visit scheduled: no  Notes to clinic:  med not delegated to NT to RF   Requested Prescriptions  Pending Prescriptions Disp Refills   ketoconazole  (NIZORAL ) 2 % cream [Pharmacy Med Name: KETOCONAZOLE  2% CREAM] 60 g 1    Sig: APPLY TO FEET TOPICALLY IN THE MORNING AND AT BED DAILY     Not Delegated - Over the Counter: OTC 2 Failed - 10/17/2023 11:09 AM      Failed - This refill cannot be delegated      Failed - Valid encounter within last 12 months    Recent Outpatient Visits   None

## 2023-10-22 ENCOUNTER — Other Ambulatory Visit: Payer: Self-pay | Admitting: Family Medicine

## 2023-10-22 NOTE — Telephone Encounter (Unsigned)
 Copied from CRM [id number]. Topic: Clinical - Medication Refill >> Oct 22, 2023  2:20 PM Donald Frost wrote: Most Recent Primary Care Visit:  Provider: Clarise Crooks  Department: [facility] [place]  Visit Type: OFFICE VISIT  Date: 07/09/2023  Medication: DULoxetine  (CYMBALTA ) 20 MG capsule  Has the patient contacted their pharmacy? Yes   Is this the correct pharmacy for this prescription? Yes If no, delete pharmacy and type the correct one.  This is the patient's preferred pharmacy:  [facility] [phone] [place], [place] - [address] [place] [address] Phone: [phone] Fax: [phone]   Has the prescription been filled recently? No  Is the patient out of the medication? Yes she has been out for 2 weeks  Has the patient been seen for an appointment in the last year OR does the patient have an upcoming appointment? Yes  Can we respond through MyChart? No she prefers a call or text if possible  Please assist patient further

## 2023-10-24 NOTE — Telephone Encounter (Signed)
 Requested medication (s) are due for refill today: yes  Requested medication (s) are on the active medication list: yes  Last refill:  05/26/23  Future visit scheduled: no  Notes to clinic:  Prescription expired 07/25/23 (at discherge from hospital signed by Dr. Jacquie Maudlin   Requested Prescriptions  Pending Prescriptions Disp Refills   DULoxetine  (CYMBALTA ) 20 MG capsule 120 capsule 1    Sig: Take 2 capsules (40 mg total) by mouth 2 (two) times daily.     Psychiatry: Antidepressants - SNRI - duloxetine  Failed - 10/24/2023 11:06 AM      Failed - Cr in normal range and within 360 days    Creatinine  Date Value Ref Range Status  04/24/2023 3.36 (H) 0.44 - 1.00 mg/dL Final  16/03/9603 5.40 0.60 - 1.30 mg/dL Final   Creatinine, Ser  Date Value Ref Range Status  07/09/2023 4.51 (H) 0.57 - 1.00 mg/dL Final   Creatinine, Urine  Date Value Ref Range Status  05/26/2023 96 mg/dL Final    Comment:    Performed at [facility], [address] [place], [place] [address]         Failed - eGFR is 30 or above and within 360 days    EGFR (African American)  Date Value Ref Range Status  04/02/2014 >60 >61mL/min Final  11/11/2012 53 (L)  Final   GFR calc Af Amer  Date Value Ref Range Status  08/10/2020 57 (L) >59 mL/min/1.73 Final    Comment:    **In accordance with recommendations from the NKF-ASN Task force,**   [facility] is in the process of updating its eGFR calculation to the   2021 CKD-EPI creatinine equation that estimates kidney function   without a race variable.    EGFR (Non-African Amer.)  Date Value Ref Range Status  04/02/2014 >60 >49mL/min Final    Comment:    eGFR values <66mL/min/1.73 m2 may be an indication of chronic kidney disease (CKD). Calculated eGFR, using the MRDR Study equation, is useful in  patients with stable renal function. The eGFR calculation will not be reliable in acutely ill patients when serum creatinine is changing rapidly. It is  not useful in patients on dialysis. The eGFR calculation may not be applicable to patients at the low and high extremes of body sizes, pregnant women, and vegetarians.   11/11/2012 46 (L)  Final    Comment:    eGFR values <74mL/min/1.73 m2 may be an indication of chronic kidney disease (CKD). Calculated eGFR is useful in patients with stable renal function. The eGFR calculation will not be reliable in acutely ill patients when serum creatinine is changing rapidly. It is not useful in  patients on dialysis. The eGFR calculation may not be applicable to patients at the low and high extremes of body sizes, pregnant women, and vegetarians.    GFR, Estimated  Date Value Ref Range Status  05/26/2023 14 (L) >60 mL/min Final    Comment:    (NOTE) Calculated using the CKD-EPI Creatinine Equation (2021)   04/24/2023 15 (L) >60 mL/min Final    Comment:    (NOTE) Calculated using the CKD-EPI Creatinine Equation (2021)    eGFR  Date Value Ref Range Status  07/09/2023 10 (L) >59 mL/min/1.73 Final         Failed - Valid encounter within last 6 months    Recent Outpatient Visits   None            Passed - Completed PHQ-2 or PHQ-9  in the last 360 days      Passed - Last BP in normal range    BP Readings from Last 1 Encounters:  07/09/23 (!) 98/58

## 2023-10-26 DIAGNOSIS — Z794 Long term (current) use of insulin: Secondary | ICD-10-CM | POA: Diagnosis not present

## 2023-10-26 DIAGNOSIS — E1151 Type 2 diabetes mellitus with diabetic peripheral angiopathy without gangrene: Secondary | ICD-10-CM | POA: Diagnosis not present

## 2023-11-03 ENCOUNTER — Other Ambulatory Visit: Payer: Self-pay | Admitting: Family Medicine

## 2023-11-03 DIAGNOSIS — B353 Tinea pedis: Secondary | ICD-10-CM

## 2023-11-05 ENCOUNTER — Other Ambulatory Visit: Payer: Self-pay | Admitting: Family Medicine

## 2023-11-05 DIAGNOSIS — D631 Anemia in chronic kidney disease: Secondary | ICD-10-CM | POA: Diagnosis not present

## 2023-11-05 DIAGNOSIS — Z1159 Encounter for screening for other viral diseases: Secondary | ICD-10-CM | POA: Diagnosis not present

## 2023-11-05 DIAGNOSIS — I10 Essential (primary) hypertension: Secondary | ICD-10-CM | POA: Diagnosis not present

## 2023-11-05 DIAGNOSIS — N185 Chronic kidney disease, stage 5: Secondary | ICD-10-CM | POA: Diagnosis not present

## 2023-11-05 NOTE — Telephone Encounter (Signed)
 Copied from CRM [id number]. Topic: Clinical - Medication Refill >> Nov 05, 2023  1:09 PM Elle L wrote: Medication: amLODipine (NORVASC) 10 MG tablet AND torsemide (DEMADEX) 20 MG tablet AND sodium bicarbonate 650 MG tablet AND Vitamin D, Ergocalciferol, (DRISDOL) 1.25 MG (50000 UNIT) CAPS capsule  Has the patient contacted their pharmacy? Yes  This is the patient's preferred pharmacy:  [phone] [place], [place] - [address] [place] [address] Phone: [phone] Fax: [phone]  Is this the correct pharmacy for this prescription? Yes If no, delete pharmacy and type the correct one.   Has the prescription been filled recently? No  Is the patient out of the medication? Yes  Has the patient been seen for an appointment in the last year OR does the patient have an upcoming appointment? Yes  Can we respond through MyChart? No  Agent: Please be advised that Rx refills may take up to 3 business days. We ask that you follow-up with your pharmacy.

## 2023-11-06 ENCOUNTER — Encounter (INDEPENDENT_AMBULATORY_CARE_PROVIDER_SITE_OTHER): Payer: Self-pay

## 2023-11-07 NOTE — Telephone Encounter (Signed)
 Requested medication (s) are due for refill today: yes  Requested medication (s) are on the active medication list: yes  Last refill:  07/16/23  Future visit scheduled: no  Notes to clinic:  Unable to refill per protocol, last refill by another provider.      Requested Prescriptions  Pending Prescriptions Disp Refills   torsemide (DEMADEX) 20 MG tablet      Sig: Take 4 tablets (80 mg total) by mouth daily. Take 4 20 mg tabs = 80 mg     Cardiovascular:  Diuretics - Loop Failed - 11/07/2023 12:34 PM      Failed - Ca in normal range and within 180 days    Calcium   Date Value Ref Range Status  07/09/2023 7.2 (L) 8.7 - 10.3 mg/dL Final   Calcium , Total  Date Value Ref Range Status  04/02/2014 8.1 (L) 8.5 - 10.1 mg/dL Final         Failed - Cr in normal range and within 180 days    Creatinine  Date Value Ref Range Status  04/24/2023 3.36 (H) 0.44 - 1.00 mg/dL Final  16/03/9603 5.40 0.60 - 1.30 mg/dL Final   Creatinine, Ser  Date Value Ref Range Status  07/09/2023 4.51 (H) 0.57 - 1.00 mg/dL Final   Creatinine, Urine  Date Value Ref Range Status  05/26/2023 96 mg/dL Final    Comment:    Performed at [facility], [address] [place], [place] [address]         Failed - Mg Level in normal range and within 180 days    Magnesium  Date Value Ref Range Status  08/25/2021 2.3 1.6 - 2.3 mg/dL Final         Failed - Valid encounter within last 6 months    Recent Outpatient Visits   None            Passed - K in normal range and within 180 days    Potassium  Date Value Ref Range Status  07/09/2023 4.3 3.5 - 5.2 mmol/L Final  04/02/2014 3.8 3.5 - 5.1 mmol/L Final         Passed - Na in normal range and within 180 days    Sodium  Date Value Ref Range Status  07/09/2023 135 134 - 144 mmol/L Final  04/02/2014 141 136 - 145 mmol/L Final         Passed - Cl in normal range and within 180 days    Chloride  Date Value Ref Range Status  07/09/2023 100 96  - 106 mmol/L Final  04/02/2014 108 (H) 98 - 107 mmol/L Final         Passed - Last BP in normal range    BP Readings from Last 1 Encounters:  07/09/23 (!) 98/58          amLODipine (NORVASC) 10 MG tablet      Sig: Take 1 tablet (10 mg total) by mouth daily.     Cardiovascular: Calcium  Channel Blockers 2 Failed - 11/07/2023 12:34 PM      Failed - Valid encounter within last 6 months    Recent Outpatient Visits   None            Passed - Last BP in normal range    BP Readings from Last 1 Encounters:  07/09/23 (!) 98/58         Passed - Last Heart Rate in normal range    Pulse Readings from Last 1 Encounters:  07/09/23 73          Vitamin D, Ergocalciferol, (DRISDOL) 1.25 MG (50000 UNIT) CAPS capsule 5 capsule     Sig: Take 1 capsule (50,000 Units total) by mouth every 7 (seven) days. 1 x week for 8 doses     Endocrinology:  Vitamins - Vitamin D Supplementation 2 Failed - 11/07/2023 12:34 PM      Failed - Manual Review: Route requests for 50,000 IU strength to the provider      Failed - Ca in normal range and within 360 days    Calcium   Date Value Ref Range Status  07/09/2023 7.2 (L) 8.7 - 10.3 mg/dL Final   Calcium , Total  Date Value Ref Range Status  04/02/2014 8.1 (L) 8.5 - 10.1 mg/dL Final         Failed - Vitamin D in normal range and within 360 days    No results found for: "ZO1096EA5", "WU9811BJ4", "VD125OH2TOT", "25OHVITD3", "25OHVITD2", "25OHVITD1", "VD25OH"       Failed - Valid encounter within last 12 months    Recent Outpatient Visits   None             sodium bicarbonate 650 MG tablet      Sig: Take 1 tablet (650 mg total) by mouth 3 (three) times daily.     Endocrinology:  Minerals 2 Failed - 11/07/2023 12:34 PM      Failed - Mg Level in normal range and within 180 days    Magnesium  Date Value Ref Range Status  08/25/2021 2.3 1.6 - 2.3 mg/dL Final         Failed - Valid encounter within last 12 months    Recent Outpatient Visits    None            Failed - BMP within normal limits in the last 6 months    Glucose  Date Value Ref Range Status  07/09/2023 392 (H) 70 - 99 mg/dL Final  78/29/5621 97  Final  04/02/2014 249 (H) 65 - 99 mg/dL Final   Glucose, Bld  Date Value Ref Range Status  05/26/2023 214 (H) 70 - 99 mg/dL Final    Comment:    Glucose reference range applies only to samples taken after fasting for at least 8 hours.   Glucose-Capillary  Date Value Ref Range Status  10/13/2022 211 (H) 70 - 99 mg/dL Final    Comment:    Glucose reference range applies only to samples taken after fasting for at least 8 hours.   Calcium   Date Value Ref Range Status  07/09/2023 7.2 (L) 8.7 - 10.3 mg/dL Final   Calcium , Total  Date Value Ref Range Status  04/02/2014 8.1 (L) 8.5 - 10.1 mg/dL Final   Sodium  Date Value Ref Range Status  07/09/2023 135 134 - 144 mmol/L Final  04/02/2014 141 136 - 145 mmol/L Final   Potassium  Date Value Ref Range Status  07/09/2023 4.3 3.5 - 5.2 mmol/L Final  04/02/2014 3.8 3.5 - 5.1 mmol/L Final   Chloride  Date Value Ref Range Status  07/09/2023 100 96 - 106 mmol/L Final  04/02/2014 108 (H) 98 - 107 mmol/L Final   BUN  Date Value Ref Range Status  07/09/2023 76 (HH) 8 - 27 mg/dL Final  30/86/5784 19 (H) 7 - 18 mg/dL Final   Creatinine  Date Value Ref Range Status  04/24/2023 3.36 (H) 0.44 - 1.00 mg/dL Final  69/62/9528 4.13 0.60 - 1.30 mg/dL Final  Creatinine, Ser  Date Value Ref Range Status  07/09/2023 4.51 (H) 0.57 - 1.00 mg/dL Final   Creatinine, Urine  Date Value Ref Range Status  05/26/2023 96 mg/dL Final    Comment:    Performed at [facility], [address] [place], [place] [address]   CO2  Date Value Ref Range Status  07/09/2023 19 (L) 20 - 29 mmol/L Final   Co2  Date Value Ref Range Status  04/02/2014 28 21 - 32 mmol/L Final   Bicarbonate  Date Value Ref Range Status  01/13/2021 20.8 20.0 - 28.0 mmol/L Final   EGFR  (African American)  Date Value Ref Range Status  04/02/2014 >60 >84mL/min Final  11/11/2012 53 (L)  Final   GFR calc Af Amer  Date Value Ref Range Status  08/10/2020 57 (L) >59 mL/min/1.73 Final    Comment:    **In accordance with recommendations from the NKF-ASN Task force,**   [facility] is in the process of updating its eGFR calculation to the   2021 CKD-EPI creatinine equation that estimates kidney function   without a race variable.    eGFR  Date Value Ref Range Status  07/09/2023 10 (L) >59 mL/min/1.73 Final   EGFR (Non-African Amer.)  Date Value Ref Range Status  04/02/2014 >60 >31mL/min Final    Comment:    eGFR values <77mL/min/1.73 m2 may be an indication of chronic kidney disease (CKD). Calculated eGFR, using the MRDR Study equation, is useful in  patients with stable renal function. The eGFR calculation will not be reliable in acutely ill patients when serum creatinine is changing rapidly. It is not useful in patients on dialysis. The eGFR calculation may not be applicable to patients at the low and high extremes of body sizes, pregnant women, and vegetarians.   11/11/2012 46 (L)  Final    Comment:    eGFR values <64mL/min/1.73 m2 may be an indication of chronic kidney disease (CKD). Calculated eGFR is useful in patients with stable renal function. The eGFR calculation will not be reliable in acutely ill patients when serum creatinine is changing rapidly. It is not useful in  patients on dialysis. The eGFR calculation may not be applicable to patients at the low and high extremes of body sizes, pregnant women, and vegetarians.    GFR, Estimated  Date Value Ref Range Status  05/26/2023 14 (L) >60 mL/min Final    Comment:    (NOTE) Calculated using the CKD-EPI Creatinine Equation (2021)   04/24/2023 15 (L) >60 mL/min Final    Comment:    (NOTE) Calculated using the CKD-EPI Creatinine Equation (2021)

## 2023-11-07 NOTE — Telephone Encounter (Signed)
 Pleased review and sign if appropriate.  JM

## 2023-11-22 DIAGNOSIS — D631 Anemia in chronic kidney disease: Secondary | ICD-10-CM | POA: Diagnosis not present

## 2023-11-22 DIAGNOSIS — N185 Chronic kidney disease, stage 5: Secondary | ICD-10-CM | POA: Diagnosis not present

## 2023-11-22 DIAGNOSIS — N2581 Secondary hyperparathyroidism of renal origin: Secondary | ICD-10-CM | POA: Diagnosis not present

## 2023-11-22 DIAGNOSIS — E872 Acidosis, unspecified: Secondary | ICD-10-CM | POA: Diagnosis not present

## 2023-11-22 DIAGNOSIS — I10 Essential (primary) hypertension: Secondary | ICD-10-CM | POA: Diagnosis not present

## 2023-11-28 ENCOUNTER — Telehealth: Payer: Self-pay

## 2023-11-28 NOTE — Telephone Encounter (Signed)
 Patient needs to schedule an appt with a new provider in our office for a follow up ( transfer of care visit) since she was a [facility] patient. Please call her to schedule with a new provider in office.

## 2023-11-28 NOTE — Telephone Encounter (Signed)
Left voice mail to set up appointment for medication refills

## 2023-11-28 NOTE — Telephone Encounter (Signed)
 Copied from CRM [id number]. Topic: Clinical - Prescription Issue >> Nov 28, 2023 10:02 AM Hobson Luna F wrote: Reason for CRM: Patient is calling in because she ran out of DULoxetine  (CYMBALTA ) 20 MG capsule [[id number]] and was told by the pharmacy her PCP needs to prescribe it. Patient wants to know what she should do.

## 2023-11-29 ENCOUNTER — Telehealth: Payer: Self-pay | Admitting: Oncology

## 2023-11-29 ENCOUNTER — Ambulatory Visit (INDEPENDENT_AMBULATORY_CARE_PROVIDER_SITE_OTHER): Admitting: Physician Assistant

## 2023-11-29 ENCOUNTER — Encounter: Payer: Self-pay | Admitting: Physician Assistant

## 2023-11-29 ENCOUNTER — Telehealth: Payer: Self-pay

## 2023-11-29 VITALS — BP 144/74 | HR 90 | Temp 98.2°F | Ht 65.0 in | Wt 111.0 lb

## 2023-11-29 DIAGNOSIS — J439 Emphysema, unspecified: Secondary | ICD-10-CM | POA: Diagnosis not present

## 2023-11-29 DIAGNOSIS — I5020 Unspecified systolic (congestive) heart failure: Secondary | ICD-10-CM

## 2023-11-29 DIAGNOSIS — Z794 Long term (current) use of insulin: Secondary | ICD-10-CM

## 2023-11-29 DIAGNOSIS — C3492 Malignant neoplasm of unspecified part of left bronchus or lung: Secondary | ICD-10-CM

## 2023-11-29 DIAGNOSIS — M8580 Other specified disorders of bone density and structure, unspecified site: Secondary | ICD-10-CM | POA: Insufficient documentation

## 2023-11-29 DIAGNOSIS — E440 Moderate protein-calorie malnutrition: Secondary | ICD-10-CM | POA: Diagnosis not present

## 2023-11-29 DIAGNOSIS — I739 Peripheral vascular disease, unspecified: Secondary | ICD-10-CM

## 2023-11-29 DIAGNOSIS — Z89511 Acquired absence of right leg below knee: Secondary | ICD-10-CM | POA: Diagnosis not present

## 2023-11-29 DIAGNOSIS — R011 Cardiac murmur, unspecified: Secondary | ICD-10-CM | POA: Insufficient documentation

## 2023-11-29 DIAGNOSIS — I5022 Chronic systolic (congestive) heart failure: Secondary | ICD-10-CM | POA: Insufficient documentation

## 2023-11-29 DIAGNOSIS — I255 Ischemic cardiomyopathy: Secondary | ICD-10-CM | POA: Insufficient documentation

## 2023-11-29 DIAGNOSIS — I700 Atherosclerosis of aorta: Secondary | ICD-10-CM | POA: Insufficient documentation

## 2023-11-29 DIAGNOSIS — E1121 Type 2 diabetes mellitus with diabetic nephropathy: Secondary | ICD-10-CM | POA: Insufficient documentation

## 2023-11-29 DIAGNOSIS — N185 Chronic kidney disease, stage 5: Secondary | ICD-10-CM | POA: Diagnosis not present

## 2023-11-29 DIAGNOSIS — E1122 Type 2 diabetes mellitus with diabetic chronic kidney disease: Secondary | ICD-10-CM | POA: Diagnosis not present

## 2023-11-29 LAB — POCT GLYCOSYLATED HEMOGLOBIN (HGB A1C): Hemoglobin A1C: 6.4 % — AB (ref 4.0–5.6)

## 2023-11-29 MED ORDER — DULOXETINE HCL 20 MG PO CPEP: 40.0000 mg | ORAL_CAPSULE | Freq: Two times a day (BID) | ORAL | 0 refills | Status: DC

## 2023-11-29 NOTE — Assessment & Plan Note (Addendum)
 A1c of 6.4% today reflects great glycemic control recently. She is delighted at this news. Continue care with endocrinology who manages her regimen.  Will be picking up more CGM sensors today

## 2023-11-29 NOTE — Assessment & Plan Note (Signed)
 Prosthesis appears to be in good condition

## 2023-11-29 NOTE — Assessment & Plan Note (Signed)
Continue care with nephrology.

## 2023-11-29 NOTE — Assessment & Plan Note (Signed)
 Proceed with vascular follow-up as planned in about 2 weeks

## 2023-11-29 NOTE — Assessment & Plan Note (Signed)
 Noted on CT imaging.  Apparently on maximum dose atorvastatin , but will confirm when she brings her medicines next visit

## 2023-11-29 NOTE — Assessment & Plan Note (Signed)
 Reached out to oncology team and coordinated a follow-up for her.  She will be reestablishing with them 12/19/23.  I will be on the look out for a status update from them.

## 2023-11-29 NOTE — Assessment & Plan Note (Signed)
 Submitting referral to establish with cardiology through [facility].  Emphasized the importance of careful medication reconciliation as soon as possible.  Will plan for short interval follow-up in about a week with me

## 2023-11-29 NOTE — Telephone Encounter (Deleted)
 Received message from Laroy Plunk PA for pt to re-establish follow up with Dr.Yu.   Patient has been scheduled for port flush and MD. Scheduling has left VM and sent appt reminder letter.

## 2023-11-29 NOTE — Assessment & Plan Note (Signed)
 Documented on CT scan from November 2024, consistent with physical exam findings.  Encouraged ongoing abstinence from smoking

## 2023-11-29 NOTE — Assessment & Plan Note (Signed)
 BMI 18, low albumin on recent labs.  Baseline weight of 100-115 lb since 2019, stable at ~110 lb last year.  Will continue to follow clinically.

## 2023-11-29 NOTE — Progress Notes (Signed)
 Date:  11/29/2023   Name:  [name] [name]   DOB:  04/18/1956   MRN:  [id number]   Chief Complaint: Medical Management of Chronic Issues and Hypertension  HPI [name] [name] is a medically complex 68 year old female who returns to clinic as a transfer of care, new to me from my colleague Dr. Alayne Allis who has recently retired.  PMH significant for non-small cell left lung cancer s/p partial resection and chemo, DM2, CKD 5, PVD s/p right BKA, HTN, HLD, history of multiple MI, history of PE, history of tobacco use, history of cocaine use, ischemic cardiomyopathy, CHF.  Overall she feels well today.   There is uncertainty about her medications but she did not bring them today for reconciliation. She requests amlodipine refill, but I think she is supposed to be taking nifedipine based on last specialist notes. She says she is taking nifedipine. She also requests refill on duloxetine , but says she takes 2 capsules TID which is excessive and not in keeping with the last rx for this med, which was prescribed by ED in Dec as 2 caps BID (still a high dose).   Last visit with nephrology 11/22/23, will be seeing them again 12/24/23. I have reviewed labs from this visit significant for combined anemia (CKD and Fe def), GFR 12, hypocalcemia, hyperphosphatemia, Vit D def. I am unable to see their A&P from that visit.   Last visit with endocrinology 10/26/23.  Insulin  regimen managed by them, patient uses Dexcom CGM with a receiver.  Last A1c was 8.4% in February, goal is less than 8%, set by endocrinology given age and comorbidities.  She will be following up with them 12/11/2023.  Regarding her cardiac conditions, last cardiology visit was in January 2025 with [facility], but she says she did not like them and so she did not return for follow up.  She is willing to establish with cardiology through [facility].   She has vascular follow-up 12/12/2023 with Doppler imaging.  Regarding her lung cancer, she is overdue for oncology  follow up.  Her last office visit for this was May 2024, followed by 4 infusions of chemo over the course of 5 months, with a chest CT November 2024 showing Stable surgical changes along left hemithorax. No new dominant mass or lymph node enlargement. Increasing small left and new tiny right pleural effusion.  She was supposed to see them for follow-up in December 2024, but missed this appointment and was not able to be contacted for reschedule.   Medication list has been reviewed and updated.  Current Meds  Medication Sig   amLODipine (NORVASC) 10 MG tablet Take 10 mg by mouth daily.   aspirin  EC 81 MG tablet Take 81 mg by mouth in the morning. Swallow whole.   atorvastatin  (LIPITOR ) 80 MG tablet Take 1 tablet by mouth daily.   calcium  acetate (PHOSLO) 667 MG capsule Take 667 mg by mouth.   carboxymethylcellulose (REFRESH PLUS) 0.5 % SOLN Place 1 drop into both eyes in the morning and at bedtime.   Continuous Glucose Receiver (DEXCOM G7 RECEIVER) DEVI Use as instructed with G7 sensors   Continuous Glucose Sensor (DEXCOM G7 SENSOR) MISC Dispense DexCom G7 sensors; Use 1 sensor q 10 days   diphenhydrAMINE  (BENADRYL ) 25 MG tablet Take 25 mg by mouth every 6 (six) hours as needed.   Glucagon 3 MG/DOSE POWD Use 1 spray in 1 nostril for severe hypoglycemia, as per package instructions   HUMALOG  KWIKPEN 100 UNIT/ML KwikPen Inject 8  Units into the skin 3 (three) times daily after meals.   hydrALAZINE  (APRESOLINE ) 100 MG tablet Take 100 mg by mouth 3 (three) times daily.   isosorbide  dinitrate (ISORDIL ) 30 MG tablet TAKE 1 TABLET BY MOUTH THREE TIMES A DAY   ketoconazole  (NIZORAL ) 2 % cream APPLY TO FEET TOPICALLY IN THE MORNING AND AT BED DAILY   torsemide (DEMADEX) 20 MG tablet Take 80 mg by mouth daily. Take 4 20 mg tabs = 80 mg   [DISCONTINUED] ACCU-CHEK GUIDE test strip USE UP TO 4 TIMES A DAY AS DIRECTED   [DISCONTINUED] Accu-Chek Softclix Lancets lancets SMARTSIG:Topical 1-4 Times Daily    [DISCONTINUED] blood glucose meter kit and supplies KIT Dispense based on patient and insurance preference. Use up to four times daily as directed.   [DISCONTINUED] DULoxetine  (CYMBALTA ) 20 MG capsule Take 2 capsules (40 mg total) by mouth 2 (two) times daily.   [DISCONTINUED] metFORMIN  (GLUCOPHAGE -XR) 500 MG 24 hr tablet TAKE 1 TABLET BY MOUTH TWICE A DAY     Review of Systems  Patient Active Problem List   Diagnosis Date Noted   Ischemic cardiomyopathy 11/29/2023   Microalbuminuric diabetic nephropathy (HCC) 11/29/2023   Chronic systolic heart failure (HCC) 11/29/2023   Aortic atherosclerosis (HCC) 11/29/2023   Emphysema/COPD (HCC) 11/29/2023   Osteopenia after menopause 11/29/2023   Systolic murmur 11/29/2023   S/P BKA (below knee amputation), right (HCC) 08/31/2023   Nephrotic syndrome 05/30/2023   Syncope 05/28/2023   Sinus bradycardia 05/28/2023   Vitamin B12 deficiency 07/10/2022   Port-A-Cath in place 07/10/2022   Liver function test abnormality    Transaminitis 08/02/2021   Chemotherapy-induced nausea 07/28/2021   Macrocytic anemia 07/07/2021   Moderate protein-calorie malnutrition (HCC) 04/12/2021   CKD (chronic kidney disease) stage 5, GFR less than 15 ml/min (HCC) 04/12/2021   Anemia of chronic disease 04/12/2021   Non-small cell cancer of left lung (HCC) 02/17/2021   Tinea pedis of both feet 02/10/2021   Chronic pain due to neoplasm 02/10/2021   Adenocarcinoma, lung, left (HCC) 01/19/2021   S/P Robotic Assisted Video Thoracoscopy with Left Lower Lobectomy Lung, Intercostal nerve block, lymph node dissection 01/17/2021   Non-small cell lung cancer (HCC) 12/30/2020   Depression 12/09/2020   Hyperlipidemia 12/09/2020   Primary hypertension 12/09/2020   Heart failure with reduced ejection fraction (HCC) 02/16/2020   History of pulmonary embolism 02/15/2020   Iron  deficiency anemia 12/18/2019   Diabetic foot ulcer (HCC) 10/03/2019   Cocaine use 08/17/2019    Peripheral vascular disease (HCC) 08/17/2019   Tobacco use disorder 08/17/2019   Tobacco abuse, in remission 08/17/2019   Cervical dysplasia 04/02/2019   Neuropathy, peripheral 01/06/2019   Peripheral arterial occlusive disease (HCC) 11/26/2018   Nicotine dependence, uncomplicated 11/26/2014   Arteriosclerosis of coronary artery 06/06/2010   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  Other Reaction(s): Facial edema, hyperkalemia  Recurrent AKI and hyperkalemia when initiation attempted.    Other Reaction(s): Facial edema, hyperkalemia    Immunization History  Administered Date(s) Administered   Fluad Trivalent(High Dose 65+) 03/05/2023   Influenza, Mdck, Trivalent,PF 6+ MOS(egg free) 06/18/2023   Influenza,inj,Quad PF,6+ Mos 08/19/2018, 04/12/2021, 08/08/2022   Influenza,inj,quad, With Preservative 04/01/2015   Moderna Sars-Covid-2  Vaccination 09/11/2019, 10/19/2019, 11/18/2019, 06/02/2020   Pneumococcal Polysaccharide-23 08/19/2018   Tdap 08/19/2018    Past Surgical History:  Procedure Laterality Date   IR IMAGING GUIDED PORT INSERTION  07/27/2021   Right lower extremity BKA Right     Social History   Tobacco Use   Smoking status: Former    Current packs/day: 0.00    Types: Cigars, Cigarettes    Quit date: 01/17/2021    Years since quitting: 2.8   Smokeless tobacco: Never  Vaping Use   Vaping status: Never Used  Substance Use Topics   Alcohol  use: Never   Drug use: Not Currently    Family History  Problem Relation Age of Onset   Heart disease Mother    Diabetes Mother    Heart disease Father    Diabetes Father         11/29/2023    9:16 AM 07/09/2023    2:51 PM  03/05/2023    2:09 PM 08/08/2022    3:04 PM  GAD 7 : Generalized Anxiety Score  Nervous, Anxious, on Edge 2 0 0 0  Control/stop worrying 1 0 0 0  Worry too much - different things 1 0 0 0  Trouble relaxing 1 0 0 0  Restless 1 0 0 0  Easily annoyed or irritable 1 0 0 0  Afraid - awful might happen 0 0 0 0  Total GAD 7 Score 7 0 0 0  Anxiety Difficulty Somewhat difficult Not difficult at all Not difficult at all Not difficult at all       11/29/2023    9:15 AM 07/09/2023    2:51 PM 03/05/2023    2:08 PM  Depression screen PHQ 2/9  Decreased Interest 2 0 0  Down, Depressed, Hopeless 0 0 0  PHQ - 2 Score 2 0 0  Altered sleeping 2 0 0  Tired, decreased energy 3 0 0  Change in appetite 0 0 0  Feeling bad or failure about yourself  0 0 0  Trouble concentrating 0 0 0  Moving slowly or fidgety/restless 0 0 0  Suicidal thoughts 0 0 0  PHQ-9 Score 7 0 0  Difficult doing work/chores Somewhat difficult Not difficult at all Not difficult at all    BP Readings from Last 3 Encounters:  11/29/23 (!) 144/74  07/09/23 (!) 98/58  05/26/23 (!) 184/120    Wt Readings from Last 3 Encounters:  11/29/23 111 lb (50.3 kg)  07/09/23 109 lb 3.2 oz (49.5 kg)  05/26/23 116 lb (52.6 kg)    BP (!) 144/74 (Cuff Size: Normal)   Pulse 90   Temp 98.2 F (36.8 C)   Ht 5' 5 (1.651 m)   Wt 111 lb (50.3 kg)   SpO2 97%   BMI 18.47 kg/m   Physical Exam Vitals and nursing note reviewed.  Constitutional:      Appearance: Normal appearance.   Cardiovascular:     Rate and Rhythm: Normal rate and regular rhythm.     Heart sounds: Murmur heard.     Systolic (aortic) murmur is present with a grade of 3/6.     No friction rub. No gallop.  Pulmonary:     Effort: Pulmonary effort is normal.     Breath sounds: Decreased air movement present. No wheezing, rhonchi or rales.  Abdominal:     General: There is no distension.   Musculoskeletal:        General: Normal range of motion.  Comments: Right  leg prosthetic s/p BKA   Skin:    General: Skin is warm and dry.   Neurological:     Mental Status: She is alert and oriented to person, place, and time.     Gait: Gait is intact.   Psychiatric:        Mood and Affect: Mood and affect normal.      Lab Results  Component Value Date   HGBA1C 6.4 (A) 11/29/2023   HGBA1C 7.4 06/09/2022   HGBA1C 7.6 (H) 08/25/2021    Recent Labs     Component Value Date/Time   NA 135 07/09/2023 1534   NA 141 04/02/2014 0427   K 4.3 07/09/2023 1534   K 3.8 04/02/2014 0427   CL 100 07/09/2023 1534   CL 108 (H) 04/02/2014 0427   CO2 19 (L) 07/09/2023 1534   CO2 28 04/02/2014 0427   GLUCOSE 392 (H) 07/09/2023 1534   GLUCOSE 214 (H) 05/26/2023 1750   GLUCOSE 249 (H) 04/02/2014 0427   BUN 76 (HH) 07/09/2023 1534   BUN 19 (H) 04/02/2014 0427   CREATININE 4.51 (H) 07/09/2023 1534   CREATININE 3.36 (H) 04/24/2023 0830   CREATININE 0.75 04/02/2014 0427   CALCIUM  7.2 (L) 07/09/2023 1534   CALCIUM  8.1 (L) 04/02/2014 0427   PROT 5.3 (L) 05/26/2023 1750   PROT 6.7 04/01/2014 1117   ALBUMIN 3.1 (L) 07/09/2023 1534   ALBUMIN 3.3 (L) 04/01/2014 1117   AST 18 05/26/2023 1750   AST 19 04/24/2023 0830   ALT 18 05/26/2023 1750   ALT 16 04/24/2023 0830   ALT 19 04/01/2014 1117   ALKPHOS 102 05/26/2023 1750   ALKPHOS 83 04/01/2014 1117   BILITOT 0.6 05/26/2023 1750   BILITOT 0.2 04/24/2023 0830   GFRNONAA 14 (L) 05/26/2023 1750   GFRNONAA 15 (L) 04/24/2023 0830   GFRNONAA >60 04/02/2014 0427   GFRNONAA 46 (L) 11/11/2012 0834   GFRAA 57 (L) 08/10/2020 1658   GFRAA >60 04/02/2014 0427   GFRAA 53 (L) 11/11/2012 0834    Lab Results  Component Value Date   WBC 4.6 05/26/2023   HGB 10.7 (L) 05/26/2023   HCT 33.6 (L) 05/26/2023   MCV 92.6 05/26/2023   PLT 313 05/26/2023   Lab Results  Component Value Date   HGBA1C 6.4 (A) 11/29/2023   Lab Results  Component Value Date   CHOL 268 (H) 05/26/2023   HDL 63 05/26/2023   LDLCALC 175 (H)  05/26/2023   TRIG 151 (H) 05/26/2023   CHOLHDL 4.3 05/26/2023   Lab Results  Component Value Date   TSH 2.055 08/17/2021     Assessment and Plan:  Type 2 diabetes mellitus with stage 5 chronic kidney disease not on chronic dialysis, with long-term current use of insulin  (HCC) Assessment & Plan: A1c of 6.4% today reflects great glycemic control recently. She is delighted at this news. Continue care with endocrinology who manages her regimen.  Will be picking up more CGM sensors today  Orders: -     POCT glycosylated hemoglobin (Hb A1C)  Microalbuminuric diabetic nephropathy (HCC) Assessment & Plan: Continue care with nephrology   Heart failure with reduced ejection fraction [facility]) Assessment & Plan: Submitting referral to establish with cardiology through [facility].  Emphasized the importance of careful medication reconciliation as soon as possible.  Will plan for short interval follow-up in about a week with me  Orders: -     Ambulatory referral to Cardiology  Non-small cell cancer of left  lung [facility]) Assessment & Plan: Reached out to oncology team and coordinated a follow-up for her.  She will be reestablishing with them 12/19/23.  I will be on the look out for a status update from them.   Peripheral vascular disease (HCC) Assessment & Plan: Proceed with vascular follow-up as planned in about 2 weeks   Moderate protein-calorie malnutrition (HCC) Assessment & Plan: BMI 18, low albumin on recent labs.  Baseline weight of 100-115 lb since 2019, stable at ~110 lb last year.  Will continue to follow clinically.   S/P BKA (below knee amputation), right [facility]) Assessment & Plan: Prosthesis appears to be in good condition   Aortic atherosclerosis [facility]) Assessment & Plan: Noted on CT imaging.  Apparently on maximum dose atorvastatin , but will confirm when she brings her medicines next visit   Pulmonary emphysema, unspecified emphysema type [facility]) Assessment & Plan: Documented  on CT scan from November 2024, consistent with physical exam findings.  Encouraged ongoing abstinence from smoking   Other orders -     DULoxetine  HCl; Take 2 capsules (40 mg total) by mouth 2 (two) times daily.  Dispense: 120 capsule; Refill: 0     Return in about 1 week (around 12/06/2023) for OV f/u chronic condition, med rec.   Today's visit billed for provider time of 90 minutes inclusive of extensive chart review, transfer of care, discussion of numerous severe chronic health conditions, coordination of specialist care, review of specialist notes, physical exam, and medication management.  Cody Das, PA-C, DMSc, Nutritionist [facility] [phone]

## 2023-11-29 NOTE — Telephone Encounter (Signed)
 Pt PCP reached out via secure chat to get pt back on schedule with Dr.Yu. I called pt 2x. On the second time pt answered and confirmed appt date and time and stated she will see it on her mychart. Pt also stated she will reach out to her insurance to schedule transportation to appt.

## 2023-11-30 ENCOUNTER — Ambulatory Visit: Payer: Self-pay | Admitting: *Deleted

## 2023-11-30 NOTE — Telephone Encounter (Signed)
 FYI Only or Action Required?: Action required by provider  Patient was last seen in primary care on 11/29/2023 by Leopoldo Rancher, PA. Called Nurse Triage reporting Medication Reaction. Symptoms began yesterday. Interventions attempted: Other: patient wants to discontinue medication . Symptoms are: stable.  Triage Disposition: Call PCP Now  Patient/caregiver understands and will follow disposition?: yes Reason for Disposition  [1] Caller has URGENT medicine question about med that PCP or specialist prescribed AND [2] triager unable to answer question  Answer Assessment - Initial Assessment Questions 1. NAME of MEDICINE: What medicine(s) are you calling about?     calcium  acetate (PHOSLO) 667 MG  2. QUESTION: What is your question? (e.g., double dose of medicine, side effect)     Patient is concerned that she is having SE to the medication- took first dose last night- daily dosing- last dose last night 3. PRESCRIBER: Who prescribed the medicine? Reason: if prescribed by specialist, call should be referred to that group.     PCP 4. SYMPTOMS: Do you have any symptoms? If Yes, ask: What symptoms are you having?  How bad are the symptoms (e.g., mild, moderate, severe)      extreme nausea, unable to eat, and is vomiting.  Protocols used: Medication Question Call-A-AH   Copied from CRM [id number]. Topic: Clinical - Red Word Triage >> Nov 30, 2023  9:20 AM Elle L wrote: Red Word that prompted transfer to Nurse Triage: The patient was seen yesterday and was prescribed calcium  acetate (PHOSLO) 667 MG capsule and she is having a reaction to the medication. She has extreme nausea, unable to eat, and is vomiting.

## 2023-12-06 ENCOUNTER — Encounter: Payer: Self-pay | Admitting: Physician Assistant

## 2023-12-06 ENCOUNTER — Ambulatory Visit (INDEPENDENT_AMBULATORY_CARE_PROVIDER_SITE_OTHER): Admitting: Physician Assistant

## 2023-12-06 VITALS — BP 122/72 | HR 92 | Ht 65.0 in | Wt 106.0 lb

## 2023-12-06 DIAGNOSIS — Z1231 Encounter for screening mammogram for malignant neoplasm of breast: Secondary | ICD-10-CM

## 2023-12-06 DIAGNOSIS — Z13820 Encounter for screening for osteoporosis: Secondary | ICD-10-CM | POA: Diagnosis not present

## 2023-12-06 DIAGNOSIS — Z780 Asymptomatic menopausal state: Secondary | ICD-10-CM | POA: Diagnosis not present

## 2023-12-06 NOTE — Progress Notes (Signed)
 Date:  12/06/2023   Name:  [name] [name]   DOB:  [birth year]   MRN:  [id number]   Chief Complaint: Medical Management of Chronic Issues  HPI Aneesha is a medically complex 68 year old female who presents as a follow-up to our last visit a week ago when she established care with our practice.  The primary reason for today's visit is for medication reconciliation, as she was a bit confused about her medicines last time.  Today she has brought the physical bottles for all of her prescriptions so that we may update her list. As suspected, she was in fact switched from amlodipine to nifedipine.   She is struggling with calium acetate capsules prescribed by nephrology, calls them her horse pills and intends to ask them for an alternative.   Additionally, she is due for mammogram and initial DEXA.  Has upcoming follow-ups with specialists including vascular, oncology, nephrology.   Medication list has been reviewed and updated.  Current Meds  Medication Sig   aspirin  EC 81 MG tablet Take 81 mg by mouth in the morning. Swallow whole.   atorvastatin  (LIPITOR ) 80 MG tablet Take 1 tablet by mouth daily.   carboxymethylcellulose (REFRESH PLUS) 0.5 % SOLN Place 1 drop into both eyes in the morning and at bedtime.   CAREFINE PEN NEEDLES 32G X 4 MM MISC CAREFINE PEN NEEDLES 32G X 4 MM   Continuous Glucose Receiver (DEXCOM G7 RECEIVER) DEVI Use as instructed with G7 sensors   Continuous Glucose Sensor (DEXCOM G7 SENSOR) MISC Dispense DexCom G7 sensors; Use 1 sensor q 10 days   diphenhydrAMINE  (BENADRYL ) 25 MG tablet Take 25 mg by mouth every 6 (six) hours as needed.   DULoxetine  (CYMBALTA ) 20 MG capsule Take 2 capsules (40 mg total) by mouth 2 (two) times daily.   Glucagon 3 MG/DOSE POWD Use 1 spray in 1 nostril for severe hypoglycemia, as per package instructions   HUMALOG  KWIKPEN 100 UNIT/ML KwikPen Inject 8 Units into the skin 3 (three) times daily after meals. (Patient taking differently: Inject  3 Units into the skin 3 (three) times daily after meals.)   hydrALAZINE  (APRESOLINE ) 100 MG tablet Take 100 mg by mouth 3 (three) times daily.   isosorbide  dinitrate (ISORDIL ) 30 MG tablet TAKE 1 TABLET BY MOUTH THREE TIMES A DAY   ketoconazole  (NIZORAL ) 2 % cream APPLY TO FEET TOPICALLY IN THE MORNING AND AT BED DAILY   LANTUS  SOLOSTAR 100 UNIT/ML Solostar Pen Inject into the skin. (Patient taking differently: Inject 5 Units into the skin at bedtime.)   NIFEdipine (PROCARDIA-XL/NIFEDICAL-XL) 30 MG 24 hr tablet Take 60 mg by mouth. (Patient taking differently: Take 30 mg by mouth daily.)   torsemide (DEMADEX) 20 MG tablet Take 80 mg by mouth daily. Take 4 20 mg tabs = 80 mg (Patient taking differently: Take 40 mg by mouth daily. Take 2, 20 mg tabs = 40 mg)     Review of Systems  Patient Active Problem List   Diagnosis Date Noted   Ischemic cardiomyopathy 11/29/2023   Microalbuminuric diabetic nephropathy (HCC) 11/29/2023   Chronic systolic heart failure (HCC) 11/29/2023   Aortic atherosclerosis (HCC) 11/29/2023   Emphysema/COPD (HCC) 11/29/2023   Osteopenia after menopause 11/29/2023   Systolic murmur 11/29/2023   S/P BKA (below knee amputation), right (HCC) 08/31/2023   Nephrotic syndrome 05/30/2023   Syncope 05/28/2023   Sinus bradycardia 05/28/2023   Vitamin B12 deficiency 07/10/2022   Port-A-Cath in place 07/10/2022   Liver function test  abnormality    Transaminitis 08/02/2021   Chemotherapy-induced nausea 07/28/2021   Macrocytic anemia 07/07/2021   Moderate protein-calorie malnutrition (HCC) 04/12/2021   CKD (chronic kidney disease) stage 5, GFR less than 15 ml/min (HCC) 04/12/2021   Anemia of chronic disease 04/12/2021   Non-small cell cancer of left lung (HCC) 02/17/2021   Tinea pedis of both feet 02/10/2021   Chronic pain due to neoplasm 02/10/2021   Adenocarcinoma, lung, left (HCC) 01/19/2021   S/P Robotic Assisted Video Thoracoscopy with Left Lower Lobectomy Lung,  Intercostal nerve block, lymph node dissection 01/17/2021   Non-small cell lung cancer (HCC) 12/30/2020   Depression 12/09/2020   Hyperlipidemia 12/09/2020   Primary hypertension 12/09/2020   Heart failure with reduced ejection fraction (HCC) 02/16/2020   History of pulmonary embolism 02/15/2020   Iron  deficiency anemia 12/18/2019   Diabetic foot ulcer (HCC) 10/03/2019   Cocaine use 08/17/2019   Peripheral vascular disease (HCC) 08/17/2019   Tobacco use disorder 08/17/2019   Tobacco abuse, in remission 08/17/2019   Cervical dysplasia 04/02/2019   Neuropathy, peripheral 01/06/2019   Peripheral arterial occlusive disease (HCC) 11/26/2018   Nicotine dependence, uncomplicated 11/26/2014   Arteriosclerosis of coronary artery 06/06/2010   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  Other Reaction(s): Facial edema, hyperkalemia  Recurrent AKI and hyperkalemia when initiation attempted.    Other Reaction(s): Facial edema, hyperkalemia    Immunization History  Administered Date(s) Administered   Fluad Trivalent(High Dose 65+) 03/05/2023   Influenza, Mdck, Trivalent,PF 6+ MOS(egg free) 06/18/2023   Influenza,inj,Quad PF,6+ Mos 08/19/2018, 04/12/2021, 08/08/2022   Influenza,inj,quad, With Preservative 04/01/2015   Moderna Sars-Covid-2 Vaccination 09/11/2019, 10/19/2019, 11/18/2019, 06/02/2020   Pneumococcal Polysaccharide-23 08/19/2018   Tdap 08/19/2018    Past Surgical History:  Procedure Laterality Date   IR IMAGING GUIDED PORT INSERTION  07/27/2021   Right lower extremity BKA Right     Social History   Tobacco Use   Smoking status: Former    Current packs/day: 0.00    Types: Cigars,  Cigarettes    Quit date: 01/17/2021    Years since quitting: 2.8   Smokeless tobacco: Never  Vaping Use   Vaping status: Never Used  Substance Use Topics   Alcohol  use: Never   Drug use: Not Currently    Family History  Problem Relation Age of Onset   Heart disease Mother    Diabetes Mother    Heart disease Father    Diabetes Father         11/29/2023    9:16 AM 07/09/2023    2:51 PM 03/05/2023    2:09 PM 08/08/2022    3:04 PM  GAD 7 : Generalized Anxiety Score  Nervous, Anxious, on Edge 2 0 0 0  Control/stop worrying 1 0 0 0  Worry too much - different things 1 0 0 0  Trouble relaxing 1 0 0 0  Restless 1 0 0 0  Easily annoyed or irritable 1 0 0 0  Afraid - awful might happen 0 0 0 0  Total GAD 7 Score 7 0 0 0  Anxiety Difficulty Somewhat difficult Not difficult at all Not difficult at all Not difficult at all       11/29/2023  9:15 AM 07/09/2023    2:51 PM 03/05/2023    2:08 PM  Depression screen PHQ 2/9  Decreased Interest 2 0 0  Down, Depressed, Hopeless 0 0 0  PHQ - 2 Score 2 0 0  Altered sleeping 2 0 0  Tired, decreased energy 3 0 0  Change in appetite 0 0 0  Feeling bad or failure about yourself  0 0 0  Trouble concentrating 0 0 0  Moving slowly or fidgety/restless 0 0 0  Suicidal thoughts 0 0 0  PHQ-9 Score 7 0 0  Difficult doing work/chores Somewhat difficult Not difficult at all Not difficult at all    BP Readings from Last 3 Encounters:  12/06/23 122/72  11/29/23 (!) 144/74  07/09/23 (!) 98/58    Wt Readings from Last 3 Encounters:  12/06/23 106 lb (48.1 kg)  11/29/23 111 lb (50.3 kg)  07/09/23 109 lb 3.2 oz (49.5 kg)    BP 122/72   Pulse 92   Ht 5' 5 (1.651 m)   Wt 106 lb (48.1 kg)   SpO2 99%   BMI 17.64 kg/m   Physical Exam Vitals and nursing note reviewed.  Constitutional:      Appearance: Normal appearance.   Cardiovascular:     Rate and Rhythm: Normal rate.  Pulmonary:     Effort: Pulmonary effort is normal.  Abdominal:      General: There is no distension.   Musculoskeletal:        General: Normal range of motion.   Skin:    General: Skin is warm and dry.   Neurological:     Mental Status: She is alert and oriented to person, place, and time.     Gait: Gait is intact.   Psychiatric:        Mood and Affect: Mood and affect normal.     Recent Labs     Component Value Date/Time   NA 135 07/09/2023 1534   NA 141 04/02/2014 0427   K 4.3 07/09/2023 1534   K 3.8 04/02/2014 0427   CL 100 07/09/2023 1534   CL 108 (H) 04/02/2014 0427   CO2 19 (L) 07/09/2023 1534   CO2 28 04/02/2014 0427   GLUCOSE 392 (H) 07/09/2023 1534   GLUCOSE 214 (H) 05/26/2023 1750   GLUCOSE 249 (H) 04/02/2014 0427   BUN 76 (HH) 07/09/2023 1534   BUN 19 (H) 04/02/2014 0427   CREATININE 4.51 (H) 07/09/2023 1534   CREATININE 3.36 (H) 04/24/2023 0830   CREATININE 0.75 04/02/2014 0427   CALCIUM  7.2 (L) 07/09/2023 1534   CALCIUM  8.1 (L) 04/02/2014 0427   PROT 5.3 (L) 05/26/2023 1750   PROT 6.7 04/01/2014 1117   ALBUMIN 3.1 (L) 07/09/2023 1534   ALBUMIN 3.3 (L) 04/01/2014 1117   AST 18 05/26/2023 1750   AST 19 04/24/2023 0830   ALT 18 05/26/2023 1750   ALT 16 04/24/2023 0830   ALT 19 04/01/2014 1117   ALKPHOS 102 05/26/2023 1750   ALKPHOS 83 04/01/2014 1117   BILITOT 0.6 05/26/2023 1750   BILITOT 0.2 04/24/2023 0830   GFRNONAA 14 (L) 05/26/2023 1750   GFRNONAA 15 (L) 04/24/2023 0830   GFRNONAA >60 04/02/2014 0427   GFRNONAA 46 (L) 11/11/2012 0834   GFRAA 57 (L) 08/10/2020 1658   GFRAA >60 04/02/2014 0427   GFRAA 53 (L) 11/11/2012 0834    Lab Results  Component Value Date   WBC 4.6 05/26/2023   HGB 10.7 (L) 05/26/2023   HCT  33.6 (L) 05/26/2023   MCV 92.6 05/26/2023   PLT 313 05/26/2023   Lab Results  Component Value Date   HGBA1C 6.4 (A) 11/29/2023   Lab Results  Component Value Date   CHOL 268 (H) 05/26/2023   HDL 63 05/26/2023   LDLCALC 175 (H) 05/26/2023   TRIG 151 (H) 05/26/2023   CHOLHDL 4.3  05/26/2023   Lab Results  Component Value Date   TSH 2.055 08/17/2021     Assessment and Plan:  Encounter for screening mammogram for breast cancer -     3D Screening Mammogram, Left and Right  Screening for osteoporosis -     DG Bone Density  Postmenopausal state -     DG Bone Density   Also helped patient reset her MyChart password again today and wrote down the new password for her.   Return in about 2 months (around 02/05/2024) for OV f/u chronic conditions.    Cody Das, PA-C, DMSc, Nutritionist [facility] [phone]

## 2023-12-11 ENCOUNTER — Telehealth: Payer: Self-pay

## 2023-12-11 DIAGNOSIS — Z794 Long term (current) use of insulin: Secondary | ICD-10-CM | POA: Diagnosis not present

## 2023-12-11 DIAGNOSIS — E1165 Type 2 diabetes mellitus with hyperglycemia: Secondary | ICD-10-CM | POA: Diagnosis not present

## 2023-12-11 NOTE — Telephone Encounter (Signed)
 Called pt gave her the number to call and schedule her mammogram. Pt verbalized understanding.  KP

## 2023-12-12 ENCOUNTER — Encounter (INDEPENDENT_AMBULATORY_CARE_PROVIDER_SITE_OTHER): Payer: Self-pay | Admitting: Nurse Practitioner

## 2023-12-12 ENCOUNTER — Ambulatory Visit (INDEPENDENT_AMBULATORY_CARE_PROVIDER_SITE_OTHER)

## 2023-12-12 ENCOUNTER — Ambulatory Visit (INDEPENDENT_AMBULATORY_CARE_PROVIDER_SITE_OTHER): Admitting: Nurse Practitioner

## 2023-12-12 ENCOUNTER — Encounter: Payer: Self-pay | Admitting: Oncology

## 2023-12-12 VITALS — BP 186/90 | HR 73 | Ht <= 58 in | Wt 105.8 lb

## 2023-12-12 DIAGNOSIS — N185 Chronic kidney disease, stage 5: Secondary | ICD-10-CM

## 2023-12-12 DIAGNOSIS — N186 End stage renal disease: Secondary | ICD-10-CM | POA: Diagnosis not present

## 2023-12-12 DIAGNOSIS — I10 Essential (primary) hypertension: Secondary | ICD-10-CM | POA: Diagnosis not present

## 2023-12-12 DIAGNOSIS — E785 Hyperlipidemia, unspecified: Secondary | ICD-10-CM | POA: Diagnosis not present

## 2023-12-12 NOTE — Progress Notes (Signed)
 Subjective:    Patient ID: [name] [name], female    DOB: Nov 09, 1955, 68 y.o.   MRN: [id number] Chief Complaint  Patient presents with   New Patient (Initial Visit)     np. ue art dup + ue v-map + consult. AV access for hemodialysis. [email or web address]     The patient is seen for evaluation for dialysis access. The patient has chronic renal insufficiency stage V secondary to hypertension. The patient's most recent creatinine clearance is less than 15. The patient volume status has not yet become an issue. Patient's blood pressures been relatively well controlled. There are mild uremic symptoms which appear to be relatively well tolerated at this time.  The patient notes the kidney problem has been present for a long time and has been progressively getting worse.  The patient is followed by nephrology.    The patient is right-handed.  The patient has been considering the various methods of dialysis and wishes to proceed with hemodialysis and therefore creation of AV access is indicated.  No recent shortening of the patient's walking distance or new symptoms consistent with claudication.  No history of rest pain symptoms. No new ulcers or wounds of the lower extremities have occurred.  The patient denies amaurosis fugax or recent TIA symptoms. There are no recent neurological changes noted. There is no history of DVT, PE or superficial thrombophlebitis. No recent episodes of angina or shortness of breath documented.   The patient has small veins which would necessitate the use of a graft based on ultrasound today.  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    Review of Systems  All other systems reviewed and are negative.      Objective:   Physical Exam Vitals reviewed.  HENT:     Head: Normocephalic.   Cardiovascular:     Rate and Rhythm: Normal rate.     Pulses:          Radial pulses are 1+ on the right side. Left radial pulse not  accessible.  Pulmonary:     Effort: Pulmonary effort is normal.   Skin:    General: Skin is warm and dry.   Neurological:     Mental Status: She is alert and oriented to person, place, and time.   Psychiatric:        Mood and Affect: Mood normal.        Behavior: Behavior normal.        Thought Content: Thought content normal.        Judgment: Judgment normal.     BP (!) 186/90 Comment: pt forgat to take b/p medication  Pulse 73   Ht (!) 5.5 (0.14 m)   Wt 105 lb 12.8 oz (48 kg)   BMI 2459.03 kg/m   Past Medical History:  Diagnosis Date   Anemia    Cancer (HCC)    CHF (congestive heart failure) (HCC)    02/16/20 HFrEF 20-25% in setting of acute DVT/PE, underlying CAD   CKD (chronic kidney disease)    Coronary artery disease    02/20/20: CTO pRCA with left-to-right collaterals, 50% mLAD, 80% OM1, medical therapy   Depression    Diabetes (HCC)    Diabetes mellitus without complication (HCC)    DVT (deep venous thrombosis) (HCC) 02/16/2020   RLE DVT proximal veins 02/16/20 Duplex   Hyperlipidemia    Hypertension    Myocardial infarction [facility]) 2009   Stress related per patient;  NSTEMI 2012 100% RCA with left-to-right collaterals   PE (pulmonary thromboembolism) (HCC) 02/15/2020   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, RLE BKA   Peripheral artery disease (HCC)     Social History   Socioeconomic History   Marital status: Married    Spouse name: Not on file   Number of children: Not on file   Years of education: Not on file   Highest education level: Not on file  Occupational History   Not on file  Tobacco Use   Smoking status: Former    Current packs/day: 0.00    Types: Cigars, Cigarettes    Quit date: 01/17/2021    Years since quitting: 2.9   Smokeless tobacco: Never  Vaping Use   Vaping status: Never Used  Substance and Sexual Activity   Alcohol  use: Never   Drug use: Not Currently   Sexual activity: Not Currently  Other Topics Concern   Not  on file  Social History Narrative   Not on file   Social Drivers of Health   Financial Resource Strain: Low Risk  (06/29/2023)   Received from [facility]   Overall Financial Resource Strain (CARDIA)    Difficulty of Paying Living Expenses: Not hard at all  Food Insecurity: No Food Insecurity (07/16/2023)   Hunger Vital Sign    Worried About Running Out of Food in the Last Year: Never true    Ran Out of Food in the Last Year: Never true  Transportation Needs: Unmet Transportation Needs (07/16/2023)   PRAPARE - Transportation    Lack of Transportation (Medical): Yes    Lack of Transportation (Non-Medical): Yes  Physical Activity: Not on file  Stress: Stress Concern Present (12/15/2021)   [employer] of Occupational Health - Occupational Stress Questionnaire    Feeling of Stress : To some extent  Social Connections: Moderately Integrated (07/16/2023)   Social Connection and Isolation Panel    Frequency of Communication with Friends and Family: More than three times a week    Frequency of Social Gatherings with Friends and Family: More than three times a week    Attends Religious Services: 1 to 4 times per year    Active Member of [employer] or Organizations: Yes    Attends Banker Meetings: 1 to 4 times per year    Marital Status: Widowed  Recent Concern: Social Connections - Moderately Isolated (06/26/2023)   Social Connection and Isolation Panel    Frequency of Communication with Friends and Family: More than three times a week    Frequency of Social Gatherings with Friends and Family: More than three times a week    Attends Religious Services: Never    Database administrator or Organizations: Yes    Attends Banker Meetings: 1 to 4 times per year    Marital Status: Widowed  Intimate Partner Violence: Not At Risk (07/16/2023)   Humiliation, Afraid, Rape, and Kick questionnaire    Fear of Current or Ex-Partner: No    Emotionally Abused: No     Physically Abused: No    Sexually Abused: No    Past Surgical History:  Procedure Laterality Date   IR IMAGING GUIDED PORT INSERTION  07/27/2021   Right lower extremity BKA Right     Family History  Problem Relation Age of Onset   Heart disease Mother    Diabetes Mother    Heart disease Father    Diabetes Father  Allergies  Allergen Reactions   Lisinopril Other (See Comments) and Swelling    Recurrent AKI and hyperkalemia when initiation attempted.  Other Reaction(s): Facial edema, hyperkalemia  Recurrent AKI and hyperkalemia when initiation attempted.    Other Reaction(s): Facial edema, hyperkalemia       Latest Ref Rng & Units 05/26/2023    5:50 PM 04/24/2023    8:30 AM 10/19/2022    1:56 PM  CBC  WBC 4.0 - 10.5 K/uL 4.6  5.6  4.8   Hemoglobin 12.0 - 15.0 g/dL 89.2  9.4  89.6   Hematocrit 36.0 - 46.0 % 33.6  30.0  33.5   Platelets 150 - 400 K/uL 313  310  367       CMP     Component Value Date/Time   NA 135 07/09/2023 1534   NA 141 04/02/2014 0427   K 4.3 07/09/2023 1534   K 3.8 04/02/2014 0427   CL 100 07/09/2023 1534   CL 108 (H) 04/02/2014 0427   CO2 19 (L) 07/09/2023 1534   CO2 28 04/02/2014 0427   GLUCOSE 392 (H) 07/09/2023 1534   GLUCOSE 214 (H) 05/26/2023 1750   GLUCOSE 249 (H) 04/02/2014 0427   BUN 76 (HH) 07/09/2023 1534   BUN 19 (H) 04/02/2014 0427   CREATININE 4.51 (H) 07/09/2023 1534   CREATININE 3.36 (H) 04/24/2023 0830   CREATININE 0.75 04/02/2014 0427   CALCIUM  7.2 (L) 07/09/2023 1534   CALCIUM  8.1 (L) 04/02/2014 0427   PROT 5.3 (L) 05/26/2023 1750   PROT 6.7 04/01/2014 1117   ALBUMIN 3.1 (L) 07/09/2023 1534   ALBUMIN 3.3 (L) 04/01/2014 1117   AST 18 05/26/2023 1750   AST 19 04/24/2023 0830   ALT 18 05/26/2023 1750   ALT 16 04/24/2023 0830   ALT 19 04/01/2014 1117   ALKPHOS 102 05/26/2023 1750   ALKPHOS 83 04/01/2014 1117   BILITOT 0.6 05/26/2023 1750   BILITOT 0.2 04/24/2023 0830   EGFR 10 (L) 07/09/2023 1534   GFRNONAA  14 (L) 05/26/2023 1750   GFRNONAA 15 (L) 04/24/2023 0830   GFRNONAA >60 04/02/2014 0427   GFRNONAA 46 (L) 11/11/2012 0834     No results found.     Assessment & Plan:   1. CKD (chronic kidney disease) stage 5, GFR less than 15 ml/min (HCC) (Primary) Recommend:  At this time the patient does not have appropriate extremity access for dialysis  Patient should have a left brachial axillary AV graft created.  There is high concern for patient and steal syndrome and so we will have a close eye on the patient and we have discussed symptoms of steal syndrome with the patient.  The risks, benefits and alternative therapies were reviewed in detail with the patient.  All questions were answered.  The patient agrees to proceed with surgery.   The patient will follow up with me in the office after the surgery.  2. Primary hypertension Continue antihypertensive medications as already ordered, these medications have been reviewed and there are no changes at this time.  3. Hyperlipidemia, unspecified hyperlipidemia type Continue statin as ordered and reviewed, no changes at this time   Current Outpatient Medications on File Prior to Visit  Medication Sig Dispense Refill   aspirin  EC 81 MG tablet Take 81 mg by mouth in the morning. Swallow whole.     atorvastatin  (LIPITOR ) 80 MG tablet Take 1 tablet by mouth daily.     carboxymethylcellulose (REFRESH PLUS) 0.5 % SOLN Place 1  drop into both eyes in the morning and at bedtime.     CAREFINE PEN NEEDLES 32G X 4 MM MISC CAREFINE PEN NEEDLES 32G X 4 MM     Continuous Glucose Receiver (DEXCOM G7 RECEIVER) DEVI Use as instructed with G7 sensors     diphenhydrAMINE  (BENADRYL ) 25 MG tablet Take 25 mg by mouth every 6 (six) hours as needed.     DULoxetine  (CYMBALTA ) 20 MG capsule Take 2 capsules (40 mg total) by mouth 2 (two) times daily. 120 capsule 0   Glucagon 3 MG/DOSE POWD Use 1 spray in 1 nostril for severe hypoglycemia, as per package  instructions     HUMALOG  KWIKPEN 100 UNIT/ML KwikPen Inject 8 Units into the skin 3 (three) times daily after meals. 15 mL 11   hydrALAZINE  (APRESOLINE ) 100 MG tablet Take 100 mg by mouth 3 (three) times daily.     isosorbide  dinitrate (ISORDIL ) 30 MG tablet TAKE 1 TABLET BY MOUTH THREE TIMES A DAY 270 tablet 1   ketoconazole  (NIZORAL ) 2 % cream APPLY TO FEET TOPICALLY IN THE MORNING AND AT BED DAILY 60 g 1   LANTUS  SOLOSTAR 100 UNIT/ML Solostar Pen Inject into the skin.     NIFEdipine (PROCARDIA-XL/NIFEDICAL-XL) 30 MG 24 hr tablet Take 60 mg by mouth.     torsemide (DEMADEX) 20 MG tablet Take 80 mg by mouth daily. Take 4 20 mg tabs = 80 mg     calcium  acetate (PHOSLO) 667 MG capsule Take 667 mg by mouth. (Patient not taking: Reported on 12/12/2023)     Continuous Glucose Sensor (DEXCOM G7 SENSOR) MISC Dispense DexCom G7 sensors; Use 1 sensor q 10 days     Current Facility-Administered Medications on File Prior to Visit  Medication Dose Route Frequency Provider Last Rate Last Admin   heparin  lock flush 100 UNIT/ML injection            heparin  lock flush 100 unit/mL  500 Units Intravenous Once Yu, Zhou, MD        There are no Patient Instructions on file for this visit. No follow-ups on file.   Shonteria Abeln E Corina Stacy, NP

## 2023-12-12 NOTE — H&P (View-Only) (Signed)
 Subjective:    Patient ID: [name] [name], female    DOB: Nov 09, 1955, 68 y.o.   MRN: [id number] Chief Complaint  Patient presents with   New Patient (Initial Visit)     np. ue art dup + ue v-map + consult. AV access for hemodialysis. manivannan     The patient is seen for evaluation for dialysis access. The patient has chronic renal insufficiency stage V secondary to hypertension. The patient's most recent creatinine clearance is less than 15. The patient volume status has not yet become an issue. Patient's blood pressures been relatively well controlled. There are mild uremic symptoms which appear to be relatively well tolerated at this time.  The patient notes the kidney problem has been present for a long time and has been progressively getting worse.  The patient is followed by nephrology.    The patient is right-handed.  The patient has been considering the various methods of dialysis and wishes to proceed with hemodialysis and therefore creation of AV access is indicated.  No recent shortening of the patient's walking distance or new symptoms consistent with claudication.  No history of rest pain symptoms. No new ulcers or wounds of the lower extremities have occurred.  The patient denies amaurosis fugax or recent TIA symptoms. There are no recent neurological changes noted. There is no history of DVT, PE or superficial thrombophlebitis. No recent episodes of angina or shortness of breath documented.   The patient has small veins which would necessitate the use of a graft based on ultrasound today.  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    Review of Systems  All other systems reviewed and are negative.      Objective:   Physical Exam Vitals reviewed.  HENT:     Head: Normocephalic.   Cardiovascular:     Rate and Rhythm: Normal rate.     Pulses:          Radial pulses are 1+ on the right side. Left radial pulse not  accessible.  Pulmonary:     Effort: Pulmonary effort is normal.   Skin:    General: Skin is warm and dry.   Neurological:     Mental Status: She is alert and oriented to person, place, and time.   Psychiatric:        Mood and Affect: Mood normal.        Behavior: Behavior normal.        Thought Content: Thought content normal.        Judgment: Judgment normal.     BP (!) 186/90 Comment: pt forgat to take b/p medication  Pulse 73   Ht (!) 5.5 (0.14 m)   Wt 105 lb 12.8 oz (48 kg)   BMI 2459.03 kg/m   Past Medical History:  Diagnosis Date   Anemia    Cancer (HCC)    CHF (congestive heart failure) (HCC)    02/16/20 HFrEF 20-25% in setting of acute DVT/PE, underlying CAD   CKD (chronic kidney disease)    Coronary artery disease    02/20/20: CTO pRCA with left-to-right collaterals, 50% mLAD, 80% OM1, medical therapy   Depression    Diabetes (HCC)    Diabetes mellitus without complication (HCC)    DVT (deep venous thrombosis) (HCC) 02/16/2020   RLE DVT proximal veins 02/16/20 Duplex   Hyperlipidemia    Hypertension    Myocardial infarction [facility]) 2009   Stress related per patient;  NSTEMI 2012 100% RCA with left-to-right collaterals   PE (pulmonary thromboembolism) (HCC) 02/15/2020   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, RLE BKA   Peripheral artery disease (HCC)     Social History   Socioeconomic History   Marital status: Married    Spouse name: Not on file   Number of children: Not on file   Years of education: Not on file   Highest education level: Not on file  Occupational History   Not on file  Tobacco Use   Smoking status: Former    Current packs/day: 0.00    Types: Cigars, Cigarettes    Quit date: 01/17/2021    Years since quitting: 2.9   Smokeless tobacco: Never  Vaping Use   Vaping status: Never Used  Substance and Sexual Activity   Alcohol  use: Never   Drug use: Not Currently   Sexual activity: Not Currently  Other Topics Concern   Not  on file  Social History Narrative   Not on file   Social Drivers of Health   Financial Resource Strain: Low Risk  (06/29/2023)   Received from [facility]   Overall Financial Resource Strain (CARDIA)    Difficulty of Paying Living Expenses: Not hard at all  Food Insecurity: No Food Insecurity (07/16/2023)   Hunger Vital Sign    Worried About Running Out of Food in the Last Year: Never true    Ran Out of Food in the Last Year: Never true  Transportation Needs: Unmet Transportation Needs (07/16/2023)   PRAPARE - Transportation    Lack of Transportation (Medical): Yes    Lack of Transportation (Non-Medical): Yes  Physical Activity: Not on file  Stress: Stress Concern Present (12/15/2021)   [employer] of Occupational Health - Occupational Stress Questionnaire    Feeling of Stress : To some extent  Social Connections: Moderately Integrated (07/16/2023)   Social Connection and Isolation Panel    Frequency of Communication with Friends and Family: More than three times a week    Frequency of Social Gatherings with Friends and Family: More than three times a week    Attends Religious Services: 1 to 4 times per year    Active Member of [employer] or Organizations: Yes    Attends Banker Meetings: 1 to 4 times per year    Marital Status: Widowed  Recent Concern: Social Connections - Moderately Isolated (06/26/2023)   Social Connection and Isolation Panel    Frequency of Communication with Friends and Family: More than three times a week    Frequency of Social Gatherings with Friends and Family: More than three times a week    Attends Religious Services: Never    Database administrator or Organizations: Yes    Attends Banker Meetings: 1 to 4 times per year    Marital Status: Widowed  Intimate Partner Violence: Not At Risk (07/16/2023)   Humiliation, Afraid, Rape, and Kick questionnaire    Fear of Current or Ex-Partner: No    Emotionally Abused: No     Physically Abused: No    Sexually Abused: No    Past Surgical History:  Procedure Laterality Date   IR IMAGING GUIDED PORT INSERTION  07/27/2021   Right lower extremity BKA Right     Family History  Problem Relation Age of Onset   Heart disease Mother    Diabetes Mother    Heart disease Father    Diabetes Father  Allergies  Allergen Reactions   Lisinopril Other (See Comments) and Swelling    Recurrent AKI and hyperkalemia when initiation attempted.  Other Reaction(s): Facial edema, hyperkalemia  Recurrent AKI and hyperkalemia when initiation attempted.    Other Reaction(s): Facial edema, hyperkalemia       Latest Ref Rng & Units 05/26/2023    5:50 PM 04/24/2023    8:30 AM 10/19/2022    1:56 PM  CBC  WBC 4.0 - 10.5 K/uL 4.6  5.6  4.8   Hemoglobin 12.0 - 15.0 g/dL 89.2  9.4  89.6   Hematocrit 36.0 - 46.0 % 33.6  30.0  33.5   Platelets 150 - 400 K/uL 313  310  367       CMP     Component Value Date/Time   NA 135 07/09/2023 1534   NA 141 04/02/2014 0427   K 4.3 07/09/2023 1534   K 3.8 04/02/2014 0427   CL 100 07/09/2023 1534   CL 108 (H) 04/02/2014 0427   CO2 19 (L) 07/09/2023 1534   CO2 28 04/02/2014 0427   GLUCOSE 392 (H) 07/09/2023 1534   GLUCOSE 214 (H) 05/26/2023 1750   GLUCOSE 249 (H) 04/02/2014 0427   BUN 76 (HH) 07/09/2023 1534   BUN 19 (H) 04/02/2014 0427   CREATININE 4.51 (H) 07/09/2023 1534   CREATININE 3.36 (H) 04/24/2023 0830   CREATININE 0.75 04/02/2014 0427   CALCIUM  7.2 (L) 07/09/2023 1534   CALCIUM  8.1 (L) 04/02/2014 0427   PROT 5.3 (L) 05/26/2023 1750   PROT 6.7 04/01/2014 1117   ALBUMIN 3.1 (L) 07/09/2023 1534   ALBUMIN 3.3 (L) 04/01/2014 1117   AST 18 05/26/2023 1750   AST 19 04/24/2023 0830   ALT 18 05/26/2023 1750   ALT 16 04/24/2023 0830   ALT 19 04/01/2014 1117   ALKPHOS 102 05/26/2023 1750   ALKPHOS 83 04/01/2014 1117   BILITOT 0.6 05/26/2023 1750   BILITOT 0.2 04/24/2023 0830   EGFR 10 (L) 07/09/2023 1534   GFRNONAA  14 (L) 05/26/2023 1750   GFRNONAA 15 (L) 04/24/2023 0830   GFRNONAA >60 04/02/2014 0427   GFRNONAA 46 (L) 11/11/2012 0834     No results found.     Assessment & Plan:   1. CKD (chronic kidney disease) stage 5, GFR less than 15 ml/min (HCC) (Primary) Recommend:  At this time the patient does not have appropriate extremity access for dialysis  Patient should have a left brachial axillary AV graft created.  There is high concern for patient and steal syndrome and so we will have a close eye on the patient and we have discussed symptoms of steal syndrome with the patient.  The risks, benefits and alternative therapies were reviewed in detail with the patient.  All questions were answered.  The patient agrees to proceed with surgery.   The patient will follow up with me in the office after the surgery.  2. Primary hypertension Continue antihypertensive medications as already ordered, these medications have been reviewed and there are no changes at this time.  3. Hyperlipidemia, unspecified hyperlipidemia type Continue statin as ordered and reviewed, no changes at this time   Current Outpatient Medications on File Prior to Visit  Medication Sig Dispense Refill   aspirin  EC 81 MG tablet Take 81 mg by mouth in the morning. Swallow whole.     atorvastatin  (LIPITOR ) 80 MG tablet Take 1 tablet by mouth daily.     carboxymethylcellulose (REFRESH PLUS) 0.5 % SOLN Place 1  drop into both eyes in the morning and at bedtime.     CAREFINE PEN NEEDLES 32G X 4 MM MISC CAREFINE PEN NEEDLES 32G X 4 MM     Continuous Glucose Receiver (DEXCOM G7 RECEIVER) DEVI Use as instructed with G7 sensors     diphenhydrAMINE  (BENADRYL ) 25 MG tablet Take 25 mg by mouth every 6 (six) hours as needed.     DULoxetine  (CYMBALTA ) 20 MG capsule Take 2 capsules (40 mg total) by mouth 2 (two) times daily. 120 capsule 0   Glucagon 3 MG/DOSE POWD Use 1 spray in 1 nostril for severe hypoglycemia, as per package  instructions     HUMALOG  KWIKPEN 100 UNIT/ML KwikPen Inject 8 Units into the skin 3 (three) times daily after meals. 15 mL 11   hydrALAZINE  (APRESOLINE ) 100 MG tablet Take 100 mg by mouth 3 (three) times daily.     isosorbide  dinitrate (ISORDIL ) 30 MG tablet TAKE 1 TABLET BY MOUTH THREE TIMES A DAY 270 tablet 1   ketoconazole  (NIZORAL ) 2 % cream APPLY TO FEET TOPICALLY IN THE MORNING AND AT BED DAILY 60 g 1   LANTUS  SOLOSTAR 100 UNIT/ML Solostar Pen Inject into the skin.     NIFEdipine (PROCARDIA-XL/NIFEDICAL-XL) 30 MG 24 hr tablet Take 60 mg by mouth.     torsemide (DEMADEX) 20 MG tablet Take 80 mg by mouth daily. Take 4 20 mg tabs = 80 mg     calcium  acetate (PHOSLO) 667 MG capsule Take 667 mg by mouth. (Patient not taking: Reported on 12/12/2023)     Continuous Glucose Sensor (DEXCOM G7 SENSOR) MISC Dispense DexCom G7 sensors; Use 1 sensor q 10 days     Current Facility-Administered Medications on File Prior to Visit  Medication Dose Route Frequency Provider Last Rate Last Admin   heparin  lock flush 100 UNIT/ML injection            heparin  lock flush 100 unit/mL  500 Units Intravenous Once Yu, Zhou, MD        There are no Patient Instructions on file for this visit. No follow-ups on file.   Shonteria Abeln E Corina Stacy, NP

## 2023-12-14 DIAGNOSIS — I2510 Atherosclerotic heart disease of native coronary artery without angina pectoris: Secondary | ICD-10-CM | POA: Diagnosis not present

## 2023-12-14 DIAGNOSIS — I1310 Hypertensive heart and chronic kidney disease without heart failure, with stage 1 through stage 4 chronic kidney disease, or unspecified chronic kidney disease: Secondary | ICD-10-CM | POA: Diagnosis not present

## 2023-12-14 DIAGNOSIS — I361 Nonrheumatic tricuspid (valve) insufficiency: Secondary | ICD-10-CM | POA: Diagnosis not present

## 2023-12-14 DIAGNOSIS — Z09 Encounter for follow-up examination after completed treatment for conditions other than malignant neoplasm: Secondary | ICD-10-CM | POA: Diagnosis not present

## 2023-12-14 DIAGNOSIS — N184 Chronic kidney disease, stage 4 (severe): Secondary | ICD-10-CM | POA: Diagnosis not present

## 2023-12-14 DIAGNOSIS — E7849 Other hyperlipidemia: Secondary | ICD-10-CM | POA: Diagnosis not present

## 2023-12-14 DIAGNOSIS — R011 Cardiac murmur, unspecified: Secondary | ICD-10-CM | POA: Diagnosis not present

## 2023-12-17 ENCOUNTER — Encounter: Payer: Self-pay | Admitting: Oncology

## 2023-12-17 DIAGNOSIS — J90 Pleural effusion, not elsewhere classified: Secondary | ICD-10-CM | POA: Diagnosis not present

## 2023-12-17 DIAGNOSIS — I110 Hypertensive heart disease with heart failure: Secondary | ICD-10-CM | POA: Diagnosis not present

## 2023-12-17 DIAGNOSIS — Z043 Encounter for examination and observation following other accident: Secondary | ICD-10-CM | POA: Diagnosis not present

## 2023-12-17 DIAGNOSIS — E119 Type 2 diabetes mellitus without complications: Secondary | ICD-10-CM | POA: Diagnosis not present

## 2023-12-17 DIAGNOSIS — Z7982 Long term (current) use of aspirin: Secondary | ICD-10-CM | POA: Diagnosis not present

## 2023-12-17 DIAGNOSIS — Z89511 Acquired absence of right leg below knee: Secondary | ICD-10-CM | POA: Diagnosis not present

## 2023-12-17 DIAGNOSIS — R609 Edema, unspecified: Secondary | ICD-10-CM | POA: Diagnosis not present

## 2023-12-17 DIAGNOSIS — I509 Heart failure, unspecified: Secondary | ICD-10-CM | POA: Diagnosis not present

## 2023-12-17 DIAGNOSIS — N289 Disorder of kidney and ureter, unspecified: Secondary | ICD-10-CM | POA: Diagnosis not present

## 2023-12-17 DIAGNOSIS — W19XXXA Unspecified fall, initial encounter: Secondary | ICD-10-CM | POA: Diagnosis not present

## 2023-12-17 DIAGNOSIS — E1122 Type 2 diabetes mellitus with diabetic chronic kidney disease: Secondary | ICD-10-CM | POA: Diagnosis not present

## 2023-12-17 DIAGNOSIS — Z794 Long term (current) use of insulin: Secondary | ICD-10-CM | POA: Diagnosis not present

## 2023-12-17 DIAGNOSIS — Z79899 Other long term (current) drug therapy: Secondary | ICD-10-CM | POA: Diagnosis not present

## 2023-12-17 DIAGNOSIS — M25551 Pain in right hip: Secondary | ICD-10-CM | POA: Diagnosis not present

## 2023-12-17 DIAGNOSIS — Z87891 Personal history of nicotine dependence: Secondary | ICD-10-CM | POA: Diagnosis not present

## 2023-12-17 DIAGNOSIS — M25511 Pain in right shoulder: Secondary | ICD-10-CM | POA: Diagnosis not present

## 2023-12-17 DIAGNOSIS — Z85118 Personal history of other malignant neoplasm of bronchus and lung: Secondary | ICD-10-CM | POA: Diagnosis not present

## 2023-12-17 DIAGNOSIS — R1011 Right upper quadrant pain: Secondary | ICD-10-CM | POA: Diagnosis not present

## 2023-12-17 DIAGNOSIS — I130 Hypertensive heart and chronic kidney disease with heart failure and stage 1 through stage 4 chronic kidney disease, or unspecified chronic kidney disease: Secondary | ICD-10-CM | POA: Diagnosis not present

## 2023-12-17 DIAGNOSIS — S299XXA Unspecified injury of thorax, initial encounter: Secondary | ICD-10-CM | POA: Diagnosis not present

## 2023-12-17 DIAGNOSIS — N184 Chronic kidney disease, stage 4 (severe): Secondary | ICD-10-CM | POA: Diagnosis not present

## 2023-12-18 ENCOUNTER — Telehealth: Payer: Self-pay

## 2023-12-18 NOTE — Transitions of Care (Post Inpatient/ED Visit) (Signed)
 12/18/2023  Name: MONQUIE FULGHAM MRN: [id number] DOB: [birth year]  Today's TOC FU Call Status: Today's TOC FU Call Status:: Successful TOC FU Call Completed TOC FU Call Complete Date: 12/18/23 Patient's Name and Date of Birth confirmed.  Transition Care Management Follow-up Telephone Call Date of Discharge: 12/17/23 Discharge Facility: Other (Non[facility]) [facility]) Type of Discharge: Emergency Department How have you been since you were released from the hospital?: Better Any questions or concerns?: No  Items Reviewed: Did you receive and understand the discharge instructions provided?: Yes Medications obtained,verified, and reconciled?: Yes (Medications Reviewed) Any new allergies since your discharge?: No Dietary orders reviewed?: NA Do you have support at home?: Yes  Medications Reviewed Today: Medications Reviewed Today     Reviewed by Judia Arnott, Steva SAILOR, CMA (Certified Medical Assistant) on 12/18/23 at 1457  Med List Status: <None>   Medication Order Taking? Sig Documenting Provider Last Dose Status Informant  aspirin  EC 81 MG tablet [id number]  Take 81 mg by mouth in the morning. Swallow whole. [provider]  Active Self  atorvastatin  (LIPITOR ) 80 MG tablet [id number]  Take 1 tablet by mouth daily. [provider]  Active   calcium  acetate (PHOSLO) 667 MG capsule [id number]  Take 667 mg by mouth.  Patient not taking: Reported on 12/12/2023   [provider]  Active   carboxymethylcellulose (REFRESH PLUS) 0.5 % SOLN [id number]  Place 1 drop into both eyes in the morning and at bedtime. [provider]  Active Self  CAREFINE PEN NEEDLES 32G X 4 MM MISC [id number]  CAREFINE PEN NEEDLES 32G X 4 MM [provider]  Active   carvedilol (COREG) 6.25 MG tablet [id number] Yes Take 6.25 mg by mouth. [provider]  Active   Continuous Glucose Receiver (DEXCOM G7 [place]) [place] [id number]  Use as  instructed with G7 sensors [provider]  Active   Continuous Glucose Sensor (DEXCOM G7 SENSOR) MISC [id number]  Dispense DexCom G7 sensors; Use 1 sensor q 10 days [provider]  Active   diphenhydrAMINE  (BENADRYL ) 25 MG tablet [id number]  Take 25 mg by mouth every 6 (six) hours as needed. [provider]  Active   DULoxetine  (CYMBALTA ) 20 MG capsule [id number]  Take 2 capsules (40 mg total) by mouth 2 (two) times daily. Manya Toribio SQUIBB, PA  Active   ezetimibe (ZETIA) 10 MG tablet [id number] Yes Take 10 mg by mouth daily. [provider]  Active   Glucagon 3 MG/DOSE POWD [id number]  Use 1 spray in 1 nostril for severe hypoglycemia, as per package instructions [provider]  Active   heparin  lock flush 100 unit/mL [id number]   Babara Call, MD  Active   heparin  lock flush 100 UNIT/ML injection [id number]     Active   HUMALOG  KWIKPEN 100 UNIT/ML KwikPen [id number]  Inject 8 Units into the skin 3 (three) times daily after meals. Joshua Cathryne BROCKS, MD  Active            Med Note DARRICK, CRYSTAL L   Mon Jul 16, 2023 12:46 PM) New directions 4 units  B-L-D  hydrALAZINE  (APRESOLINE ) 100 MG tablet [id number]  Take 100 mg by mouth 3 (three) times daily. [provider]  Active   isosorbide  dinitrate (ISORDIL ) 30 MG tablet [id number]  TAKE 1 TABLET BY MOUTH THREE TIMES A DAY Jones, Deanna C, MD  Active   ketoconazole  (NIZORAL ) 2 % cream [id number]  APPLY TO FEET TOPICALLY IN THE MORNING  AND AT BED DAILY Jones, Deanna C, MD  Active   LANTUS  SOLOSTAR 100 UNIT/ML Solostar Pen [id number]  Inject into the skin. [provider]  Active   NIFEdipine (PROCARDIA-XL/NIFEDICAL-XL) 30 MG 24 hr tablet [id number]  Take 60 mg by mouth. [provider]  Active   torsemide (DEMADEX) 20 MG tablet [id number]  Take 80 mg by mouth daily. Take 4 20 mg tabs = 80 mg [provider]  Active             Home Care and Equipment/Supplies: Were Home Health  Services Ordered?: NA Any new equipment or medical supplies ordered?: NA  Functional Questionnaire: Do you need assistance with bathing/showering or dressing?: No Do you need assistance with meal preparation?: No Do you need assistance with eating?: No Do you have difficulty maintaining continence: No Do you need assistance with getting out of bed/getting out of a chair/moving?: No Do you have difficulty managing or taking your medications?: No  Follow up appointments reviewed: PCP Follow-up appointment confirmed?: No (does not need an appointment) MD Provider Line Number:[phone] Given: Yes Specialist Hospital Follow-up appointment confirmed?: NA Do you need transportation to your follow-up appointment?: No Do you understand care options if your condition(s) worsen?: Yes-patient verbalized understanding    SIGNATURE Steva Kerstie Agent, CMA

## 2023-12-19 ENCOUNTER — Telehealth (INDEPENDENT_AMBULATORY_CARE_PROVIDER_SITE_OTHER): Payer: Self-pay

## 2023-12-19 ENCOUNTER — Inpatient Hospital Stay: Admitting: Oncology

## 2023-12-19 ENCOUNTER — Ambulatory Visit: Attending: Internal Medicine | Admitting: Internal Medicine

## 2023-12-19 ENCOUNTER — Encounter

## 2023-12-19 ENCOUNTER — Inpatient Hospital Stay

## 2023-12-19 ENCOUNTER — Ambulatory Visit: Admitting: Oncology

## 2023-12-19 NOTE — Telephone Encounter (Signed)
 I attempted to contact the patient to schedule her for a left brachial axillary graft with Dr. Wyn Quaker. A message was left for a return call.

## 2023-12-19 NOTE — Progress Notes (Deleted)
  Cardiology Office Note:  .   Date:  12/19/2023  ID:  [name] [name], DOB 1956-03-05, MRN [id number] PCP: Manya Toribio SQUIBB, PA  [facility] Providers Cardiologist:  None { Click to update primary MD,subspecialty MD or APP then REFRESH:1}    History of Present Illness: .   [name] [name] is a 68 y.o. 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  She was previously followed through [facility] cardiology in [place] (and [facility] before that), having last been seen there less than a week ago.  At that time, she appeared to be well compensated.  She was transitioned from nifedipine to carvedilol and maintained on hydralazine /isosorbide  dinitrate as well as torsemide.  Repeat echo was ordered.  Further GDMT titration was limited by her CKD.  ROS: See HPI  Studies Reviewed: .        *** Risk Assessment/Calculations:   {Does this patient have ATRIAL FIBRILLATION?:[phone]} No BP recorded.  {Refresh Note OR Click here to enter BP  :1}***       Physical Exam:   VS:  There were no vitals taken for this visit.   Wt Readings from Last 3 Encounters:  12/12/23 105 lb 12.8 oz (48 kg)  12/06/23 106 lb (48.1 kg)  11/29/23 111 lb (50.3 kg)    General:  NAD. Neck: No JVD or HJR. Lungs: Clear to auscultation bilaterally without wheezes or crackles. Heart: Regular rate and rhythm without murmurs, rubs, or gallops. Abdomen: Soft, nontender, nondistended. Extremities: No lower extremity edema.  ASSESSMENT AND PLAN: .    ***    {Are you ordering a CV Procedure (e.g. stress test, cath, DCCV, TEE, etc)?   Press F2        :[id number]}  Dispo:  ***  Signed, Lonni Hanson, MD

## 2023-12-20 ENCOUNTER — Telehealth (INDEPENDENT_AMBULATORY_CARE_PROVIDER_SITE_OTHER): Payer: Self-pay

## 2023-12-20 NOTE — Telephone Encounter (Signed)
 I attempted to return the patient's call and left a message for a return call.

## 2023-12-21 ENCOUNTER — Other Ambulatory Visit: Payer: Self-pay | Admitting: Physician Assistant

## 2023-12-24 ENCOUNTER — Inpatient Hospital Stay: Admitting: Oncology

## 2023-12-24 ENCOUNTER — Encounter: Payer: Self-pay | Admitting: Oncology

## 2023-12-24 ENCOUNTER — Inpatient Hospital Stay: Attending: Oncology

## 2023-12-25 ENCOUNTER — Telehealth: Payer: Self-pay

## 2023-12-25 NOTE — Telephone Encounter (Signed)
 Requested Prescriptions  Pending Prescriptions Disp Refills   DULoxetine  (CYMBALTA ) 20 MG capsule [Pharmacy Med Name: DULOXETINE  HCL DR 20 MG CAP] 360 capsule 0    Sig: TAKE 2 CAPSULES (40 MG TOTAL) BY MOUTH 2 (TWO) TIMES DAILY.     Psychiatry: Antidepressants - SNRI - duloxetine  Failed - 12/25/2023 11:40 AM      Failed - Cr in normal range and within 360 days    Creatinine  Date Value Ref Range Status  04/24/2023 3.36 (H) 0.44 - 1.00 mg/dL Final  89/84/7984 9.24 0.60 - 1.30 mg/dL Final   Creatinine, Ser  Date Value Ref Range Status  07/09/2023 4.51 (H) 0.57 - 1.00 mg/dL Final   Creatinine, Urine  Date Value Ref Range Status  05/26/2023 96 mg/dL Final    Comment:    Performed at [facility], [address] [place], [place] [address]         Failed - eGFR is 30 or above and within 360 days    EGFR (African American)  Date Value Ref Range Status  04/02/2014 >60 >64mL/min Final  11/11/2012 53 (L)  Final   GFR calc Af Amer  Date Value Ref Range Status  08/10/2020 57 (L) >59 mL/min/1.73 Final    Comment:    **In accordance with recommendations from the NKF-ASN Task force,**   [facility] is in the process of updating its eGFR calculation to the   2021 CKD-EPI creatinine equation that estimates kidney function   without a race variable.    EGFR (Non-African Amer.)  Date Value Ref Range Status  04/02/2014 >60 >79mL/min Final    Comment:    eGFR values <73mL/min/1.73 m2 may be an indication of chronic kidney disease (CKD). Calculated eGFR, using the MRDR Study equation, is useful in  patients with stable renal function. The eGFR calculation will not be reliable in acutely ill patients when serum creatinine is changing rapidly. It is not useful in patients on dialysis. The eGFR calculation may not be applicable to patients at the low and high extremes of body sizes, pregnant women, and vegetarians.   11/11/2012 46 (L)  Final    Comment:    eGFR values  <49mL/min/1.73 m2 may be an indication of chronic kidney disease (CKD). Calculated eGFR is useful in patients with stable renal function. The eGFR calculation will not be reliable in acutely ill patients when serum creatinine is changing rapidly. It is not useful in  patients on dialysis. The eGFR calculation may not be applicable to patients at the low and high extremes of body sizes, pregnant women, and vegetarians.    GFR, Estimated  Date Value Ref Range Status  05/26/2023 14 (L) >60 mL/min Final    Comment:    (NOTE) Calculated using the CKD-EPI Creatinine Equation (2021)   04/24/2023 15 (L) >60 mL/min Final    Comment:    (NOTE) Calculated using the CKD-EPI Creatinine Equation (2021)    eGFR  Date Value Ref Range Status  07/09/2023 10 (L) >59 mL/min/1.73 Final         Failed - Last BP in normal range    BP Readings from Last 1 Encounters:  12/12/23 (!) 186/90         Passed - Completed PHQ-2 or PHQ-9 in the last 360 days      Passed - Valid encounter within last 6 months    Recent Outpatient Visits           2 weeks ago Encounter for screening  mammogram for breast cancer   [facility], Toribio SQUIBB, [place]   3 weeks ago Type 2 diabetes mellitus with stage 5 chronic kidney disease not on chronic dialysis, with long-term current use of insulin  [facility])   [facility] at [facility], Toribio SQUIBB, [place]       Future Appointments             In 1 month Manya, Toribio SQUIBB, [place] [facility] at [facility], [place]

## 2023-12-25 NOTE — Telephone Encounter (Signed)
 Copied from CRM [id number]. Topic: General - Other >> Dec 25, 2023  4:30 PM Nathanel BROCKS wrote: Reason for CRM:   Carly with [facility] calls, [phone]  Santina out to check on pt and she is wanting to let Dr Manya know that the pt seems to be falling quite often. There are no symptoms of anything wrong, just falling and having to go to the ER.

## 2023-12-26 ENCOUNTER — Telehealth (INDEPENDENT_AMBULATORY_CARE_PROVIDER_SITE_OTHER): Payer: Self-pay

## 2023-12-26 NOTE — Telephone Encounter (Signed)
 Spoke with the patient and she is scheduled with Dr. Marea for a left brachial axillary graft at [facility] . Pre-admit will call to schedule pre-op  at the [facility]. Pre-surgical instructions were discussed and will be sent to Mychart and mailed.

## 2023-12-26 NOTE — Telephone Encounter (Signed)
Noted, will discuss next visit

## 2023-12-31 ENCOUNTER — Telehealth (INDEPENDENT_AMBULATORY_CARE_PROVIDER_SITE_OTHER): Payer: Self-pay

## 2023-12-31 NOTE — Telephone Encounter (Signed)
 Patient called wanting to know about her surgery. I asked patient about her paperwork that she received and she stated she had not opened it. I advised the patient to read her surgical instructions.

## 2024-01-02 ENCOUNTER — Encounter
Admission: RE | Admit: 2024-01-02 | Discharge: 2024-01-02 | Disposition: A | Discharge status: Home / Self Care | Source: Ambulatory Visit | Attending: Vascular Surgery | Admitting: Vascular Surgery

## 2024-01-02 ENCOUNTER — Other Ambulatory Visit: Payer: Self-pay

## 2024-01-02 ENCOUNTER — Other Ambulatory Visit (INDEPENDENT_AMBULATORY_CARE_PROVIDER_SITE_OTHER): Payer: Self-pay | Admitting: Nurse Practitioner

## 2024-01-02 VITALS — BP 123/66 | HR 72 | Temp 98.0°F | Resp 18 | Ht 65.0 in | Wt 107.8 lb

## 2024-01-02 DIAGNOSIS — N186 End stage renal disease: Secondary | ICD-10-CM | POA: Insufficient documentation

## 2024-01-02 DIAGNOSIS — Z01812 Encounter for preprocedural laboratory examination: Secondary | ICD-10-CM

## 2024-01-02 DIAGNOSIS — Z01818 Encounter for other preprocedural examination: Secondary | ICD-10-CM | POA: Insufficient documentation

## 2024-01-02 DIAGNOSIS — E1122 Type 2 diabetes mellitus with diabetic chronic kidney disease: Secondary | ICD-10-CM

## 2024-01-02 HISTORY — DX: Heart disease, unspecified: I51.9

## 2024-01-02 HISTORY — DX: Chronic kidney disease, stage 4 (severe): N18.4

## 2024-01-02 HISTORY — DX: Rheumatic tricuspid insufficiency: I07.1

## 2024-01-02 HISTORY — DX: Atherosclerotic heart disease of native coronary artery without angina pectoris: I25.10

## 2024-01-02 HISTORY — DX: Cocaine abuse, uncomplicated: F14.10

## 2024-01-02 HISTORY — DX: Cardiomegaly: I51.7

## 2024-01-02 HISTORY — DX: Nonrheumatic tricuspid (valve) insufficiency: I36.1

## 2024-01-02 HISTORY — DX: Dyspnea, unspecified: R06.00

## 2024-01-02 HISTORY — DX: Cardiac murmur, unspecified: R01.1

## 2024-01-02 LAB — BASIC METABOLIC PANEL WITH GFR
Anion gap: 12 (ref 5–15)
BUN: 76 mg/dL — ABNORMAL HIGH (ref 8–23)
CO2: 19 mmol/L — ABNORMAL LOW (ref 22–32)
Calcium: 8 mg/dL — ABNORMAL LOW (ref 8.9–10.3)
Chloride: 110 mmol/L (ref 98–111)
Creatinine, Ser: 4.03 mg/dL — ABNORMAL HIGH (ref 0.44–1.00)
GFR, Estimated: 12 mL/min — ABNORMAL LOW (ref >60)
Glucose, Bld: 193 mg/dL — ABNORMAL HIGH (ref 70–99)
Potassium: 3.4 mmol/L — ABNORMAL LOW (ref 3.5–5.1)
Sodium: 141 mmol/L (ref 135–145)

## 2024-01-02 LAB — CBC WITH DIFFERENTIAL/PLATELET
Abs Immature Granulocytes: 0 K/uL (ref 0.00–0.07)
Basophils Absolute: 0 K/uL (ref 0.0–0.1)
Basophils Relative: 0 %
Eosinophils Absolute: 0.5 K/uL (ref 0.0–0.5)
Eosinophils Relative: 8 %
HCT: 29 % — ABNORMAL LOW (ref 36.0–46.0)
Hemoglobin: 9.1 g/dL — ABNORMAL LOW (ref 12.0–15.0)
Immature Granulocytes: 0 %
Lymphocytes Relative: 14 %
Lymphs Abs: 0.8 K/uL (ref 0.7–4.0)
MCH: 29.7 pg (ref 26.0–34.0)
MCHC: 31.4 g/dL (ref 30.0–36.0)
MCV: 94.8 fL (ref 80.0–100.0)
Monocytes Absolute: 0.5 K/uL (ref 0.1–1.0)
Monocytes Relative: 8 %
Neutro Abs: 4.1 K/uL (ref 1.7–7.7)
Neutrophils Relative %: 70 %
Platelets: 295 K/uL (ref 150–400)
RBC: 3.06 MIL/uL — ABNORMAL LOW (ref 3.87–5.11)
RDW: 13.5 % (ref 11.5–15.5)
WBC: 5.9 K/uL (ref 4.0–10.5)
nRBC: 0 % (ref 0.0–0.2)

## 2024-01-02 LAB — TYPE AND SCREEN
ABO/RH(D): B POS
Antibody Screen: NEGATIVE

## 2024-01-02 LAB — SURGICAL PCR SCREEN
MRSA, PCR: NEGATIVE
Staphylococcus aureus: NEGATIVE

## 2024-01-02 NOTE — Patient Instructions (Addendum)
 Your procedure is scheduled on: Thursday 01/10/24 Report to the Registration Desk on the 1st floor of the [facility]. To find out your arrival time, please call [phone] between 1PM - 3PM on: Wednesday 01/09/24 If your arrival time is 6:00 am, do not arrive before that time as the [facility] entrance doors do not open until 6:00 am.  REMEMBER: Instructions that are not followed completely may result in serious medical risk, up to and including death; or upon the discretion of your surgeon and anesthesiologist your surgery may need to be rescheduled.  Do not eat food or drink fluids after midnight the night before surgery.  No gum chewing or hard candies.  One week prior to surgery: Stop Anti-inflammatories (NSAIDS) such as Advil, Aleve , Ibuprofen, Motrin, Naproxen , Naprosyn  and Aspirin  based products such as Excedrin, Goody's Powder, BC Powder. Stop ANY OVER THE COUNTER supplements until after surgery.  You may however, continue to take Tylenol  if needed for pain up until the day of surgery.  Take 1/2 of your normal LANTUS  SOLOSTAR 100 UNIT/ML Solostar Pen the night before your surgery. Do not take insulin  the morning of your surgery.   Continue taking all of your other prescription medications up until the day of surgery.  ON THE DAY OF SURGERY ONLY TAKE THESE MEDICATIONS WITH SIPS OF WATER:  carvedilol (COREG) 6.25 MG  DULoxetine  (CYMBALTA ) 20 MG  hydrALAZINE  (APRESOLINE ) 100 MG   isosorbide  dinitrate (ISORDIL ) 30 MG    No Alcohol  for 24 hours before or after surgery.  No Smoking including e-cigarettes for 24 hours before surgery.  No chewable tobacco products for at least 6 hours before surgery.  No nicotine patches on the day of surgery.  Do not use any recreational drugs for at least a week (preferably 2 weeks) before your surgery.  Please be advised that the combination of cocaine and anesthesia may have negative outcomes, up to and including death. If you test  positive for cocaine, your surgery will be cancelled.  On the morning of surgery brush your teeth with toothpaste and water, you may rinse your mouth with mouthwash if you wish. Do not swallow any toothpaste or mouthwash.  Use CHG Soap or wipes as directed on instruction sheet.  Do not wear jewelry, make-up, hairpins, clips or nail polish.  For welded (permanent) jewelry: bracelets, anklets, waist bands, etc.  Please have this removed prior to surgery.  If it is not removed, there is a chance that hospital personnel will need to cut it off on the day of surgery.  Do not wear lotions, powders, or perfumes.   Do not shave body hair from the neck down 48 hours before surgery.  Contact lenses, hearing aids and dentures may not be worn into surgery.  Do not bring valuables to the hospital. [facility] is not responsible for any missing/lost belongings or valuables.   Total Shoulder Arthroplasty:  use Benzoyl Peroxide 5% Gel as directed on instruction sheet.  Bring your C-PAP to the hospital in case you may have to spend the night.   Notify your doctor if there is any change in your medical condition (cold, fever, infection).  Wear comfortable clothing (specific to your surgery type) to the hospital.  After surgery, you can help prevent lung complications by doing breathing exercises.  Take deep breaths and cough every 1-2 hours. Your doctor may order a device called an Incentive Spirometer to help you take deep breaths. When coughing or sneezing, hold a pillow firmly  against your incision with both hands. This is called "splinting." Doing this helps protect your incision. It also decreases belly discomfort.  If you are being admitted to the hospital overnight, leave your suitcase in the car. After surgery it may be brought to your room.  In case of increased patient census, it may be necessary for you, the patient, to continue your postoperative care in the Same Day Surgery  department.  If you are being discharged the day of surgery, you will not be allowed to drive home. You will need a responsible individual to drive you home and stay with you for 24 hours after surgery.   If you are taking public transportation, you will need to have a responsible individual with you.  Please call the Pre-admissions Testing Dept. at [phone] if you have any questions about these instructions.  Surgery Visitation Policy:  Patients having surgery or a procedure may have two visitors.  Children under the age of 62 must have an adult with them who is not the patient.  Inpatient Visitation:    Visiting hours are 7 a.m. to 8 p.m. Up to four visitors are allowed at one time in a patient room. The visitors may rotate out with other people during the day.  One visitor age 21 or older may stay with the patient overnight and must be in the room by 8 p.m.   Merchandiser, retail to address health-related social needs:  [email or web address]    Preparing for Surgery with CHLORHEXIDINE  GLUCONATE (CHG) Soap  Chlorhexidine  Gluconate (CHG) Soap  o An antiseptic cleaner that kills germs and bonds with the skin to continue killing germs even after washing  o Used for showering the night before surgery and morning of surgery  Before surgery, you can play an important role by reducing the number of germs on your skin.  CHG (Chlorhexidine  gluconate) soap is an antiseptic cleanser which kills germs and bonds with the skin to continue killing germs even after washing.  Please do not use if you have an allergy to CHG or antibacterial soaps. If your skin becomes reddened/irritated stop using the CHG.  1. Shower the NIGHT BEFORE SURGERY and the MORNING OF SURGERY with CHG soap.  2. If you choose to wash your hair, wash your hair first as usual with your normal shampoo.  3. After shampooing, rinse your hair and body thoroughly to remove the shampoo.  4. Use  CHG as you would any other liquid soap. You can apply CHG directly to the skin and wash gently with a scrungie or a clean washcloth.  5. Apply the CHG soap to your body only from the neck down. Do not use on open wounds or open sores. Avoid contact with your eyes, ears, mouth, and genitals (private parts). Wash face and genitals (private parts) with your normal soap.  6. Wash thoroughly, paying special attention to the area where your surgery will be performed.  7. Thoroughly rinse your body with warm water.  8. Do not shower/wash with your normal soap after using and rinsing off the CHG soap.  9. Pat yourself dry with a clean towel.  10. Wear clean pajamas to bed the night before surgery.  12. Place clean sheets on your bed the night of your first shower and do not sleep with pets.  13. Shower again with the CHG soap on the day of surgery prior to arriving at the hospital.  14. Do not apply any deodorants/lotions/powders.  15. Please wear  clean clothes to the hospital.

## 2024-01-07 ENCOUNTER — Encounter: Payer: Self-pay | Admitting: Vascular Surgery

## 2024-01-07 NOTE — Progress Notes (Signed)
 Perioperative / Anesthesia Services  Pre-Admission Testing Clinical Review / Pre-Operative Anesthesia Consult  Date: 01/07/24  PATIENT DEMOGRAPHICS: Name: [name] [name] DOB: [birth year] MRN:   [id number]  Note: Available PAT nursing documentation and vital signs have been reviewed. Clinical nursing staff has updated patient's PMH/PSHx, current medication list, and drug allergies/intolerances to ensure complete and comprehensive history available to assist care teams in MDM as it pertains to the aforementioned surgical procedure and anticipated anesthetic course. Extensive review of available clinical information personally performed. [place] PMH and PSHx updated with any diagnoses/procedures that  may have been inadvertently omitted during his intake with the pre-admission testing department's nursing staff.  PLANNED SURGICAL PROCEDURE(S):   Case: [id number] Date/Time: 01/10/24 0715   Procedure: INSERTION, GRAFT, ARTERIOVENOUS, UPPER EXTREMITY (Left) - BRACHIAL AXILLARY   Anesthesia type: General   Diagnosis: Chronic kidney disease, stage 5, kidney failure (HCC) [N18.5]   Pre-op  diagnosis: CHRONIC KIDNEY DISEASE STAGE 5   Location: [facility] OR ROOM 08 / [facility] ORS FOR ANESTHESIA GROUP   Surgeons: Marea Selinda RAMAN, MD        CLINICAL DISCUSSION: [name] [name] is a 68 y.o. female who is submitted for pre-surgical anesthesia review and clearance prior to her undergoing the above procedure. Patient is a Former Smoker (quit 01/2021). Pertinent PMH includes: CAD, NSTEMI, HFrEF, systolic ejection murmur, VTE, chronic cerebral microvascular disease, PAD, HTN, HLD, T2DM, CKD-V, dyspnea, anemia of chronic renal disease, depression.  Patient is followed by cardiology Leobardo, MD). She was last seen in the cardiology clinic on ***; notes reviewed. ***At the time of her clinic visit, patient doing well overall from a cardiovascular perspective. Patient denied any chest pain, shortness of breath, PND, orthopnea,  palpitations, significant peripheral edema, weakness, fatigue, vertiginous symptoms, or presyncope/syncope. Patient with a past medical history significant for cardiovascular diagnoses. Documented physical exam was grossly benign, providing no evidence of acute exacerbation and/or decompensation of the patient's known cardiovascular conditions.  ***  Blood pressure***controlled at *** mmHg on currently prescribed *** therapies.  Patient is on *** for her HLD diagnosis and ASCVD prevention. ***Patient is not diabetic. ***She does not have an OSAH diagnosis. ***FC. No changes were made to her medication regimen during her visit with cardiology.  Patient scheduled to follow-up with outpatient cardiology in***months or sooner if needed.  [name] Finder is scheduled for INSERTION, GRAFT, ARTERIOVENOUS, UPPER EXTREMITY (Left) on 01/10/2024 with Dr. Selinda Marea, MD. Given patient's past medical history significant for cardiovascular diagnoses, presurgical cardiac clearance was sought by the PAT team. Per cardiology, this patient is optimized for surgery and may proceed with the planned procedural course with a MODERATE risk of significant perioperative cardiovascular complications.  In review of the patient's chart, it is noted that she is on daily oral antithrombotic therapy. Given that patient's past medical history is significant for cardiovascular diagnoses, including but not limited to CAD, vascular surgery has cleared patient to continue her daily low dose ASA throughout her perioperative course.  Patient has been updated on these directives from her specialty care providers by the PAT team.  Patient denies previous perioperative complications with anesthesia in the past. In review her EMR, it is noted that patient underwent a MAC anesthetic course at [facility] (ASA III) in 06/14/2023 without documented complications.   MOST RECENT VITAL SIGNS:    01/02/2024    9:24 AM 12/12/2023    10:18 AM 12/06/2023    2:44 PM  Vitals with BMI  Height 5' 5  0' 5.5   Weight 107 lbs 13 oz 105 lbs 13 oz   BMI 17.94 2448.52   Systolic 123 186 877  Diastolic 66 90 72  Pulse 72 73    PROVIDERS/SPECIALISTS: NOTE: Primary physician provider listed below. Patient may have been seen by APP or partner within same practice.   PROVIDER ROLE / SPECIALTY LAST SHERLEAN Marea Selinda GORMAN, MD Vascular Surgery (Surgeon) 12/12/2023  Manya Toribio SQUIBB, PA Primary Care Provider 12/06/2023  [name] Old, MD Cardiology 12/14/2023  Teresia Space, MD Nephrology 11/22/2023   ALLERGIES: Allergies  Allergen Reactions   Lisinopril Swelling and Other (See Comments)    Recurrent AKI and hyperkalemia when initiation attempted. Other Reaction(s): Facial edema    CURRENT HOME MEDICATIONS: No current facility-administered medications for this encounter.    aspirin  EC 81 MG tablet   atorvastatin  (LIPITOR ) 80 MG tablet   calcium  acetate (PHOSLO) 667 MG capsule   carboxymethylcellulose (REFRESH PLUS) 0.5 % SOLN   CAREFINE PEN NEEDLES 32G X 4 MM MISC   carvedilol (COREG) 6.25 MG tablet   Continuous Glucose Receiver (DEXCOM G7 RECEIVER) DEVI   Continuous Glucose Sensor (DEXCOM G7 SENSOR) MISC   diphenhydrAMINE  (BENADRYL ) 25 MG tablet   DULoxetine  (CYMBALTA ) 20 MG capsule   ezetimibe (ZETIA) 10 MG tablet   Glucagon 3 MG/DOSE POWD   HUMALOG  KWIKPEN 100 UNIT/ML KwikPen   hydrALAZINE  (APRESOLINE ) 100 MG tablet   isosorbide  dinitrate (ISORDIL ) 30 MG tablet   ketoconazole  (NIZORAL ) 2 % cream   LANTUS  SOLOSTAR 100 UNIT/ML Solostar Pen   NIFEdipine (PROCARDIA-XL/NIFEDICAL-XL) 30 MG 24 hr tablet   torsemide (DEMADEX) 20 MG tablet    heparin  lock flush 100 UNIT/ML injection   heparin  lock flush 100 unit/mL   HISTORY: Past Medical History:  Diagnosis Date   Adenocarcinoma of left lung (HCC)    a.) stage IIIA (pT1c, pN2, cM0) --> s/p LLL lobectomy + 4 cycles of carbo/taxol  + 1 cycle atezolizumab  (discontinued  for grade IV toxicity)   Anemia of chronic renal failure    CKD (chronic kidney disease), stage V (HCC)    Cocaine abuse (HCC)    Coronary artery disease    a.) LHC (NSTEMI) 05/30/2010: 10% pLCxm 20% pRCA-1, 100% pRCA-2 - med mgmt; b.) LHC 02/24/2020: CTA pRCA with good L-R collaterals, 50% mLAD, 80% OM1 - med mgmt   Depression    Dyspnea    HFrEF (heart failure with reduced ejection fraction) (HCC) 02/16/2020   a.) Dx'd in setting of acute DVT/PE   Hyperlipidemia    Hypertension    Long-term use of aspirin  therapy    NSTEMI (non-ST elevated myocardial infarction) (HCC) 05/28/2010   a.) LHC 05/30/2010: 10% pLCx, 20% pRCA-1, 100% pRCA-2 (chronic) with good collaterals - med mgmt   Peripheral artery disease (HCC)    Systolic ejection murmur    T2DM (type 2 diabetes mellitus) (HCC)    Venous thromboembolism (VTE) 02/15/2020   a.) 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; CTA (+) for multiple RIGHT segmental/subsegment pulmonary emboli   Past Surgical History:  Procedure Laterality Date   IR IMAGING GUIDED PORT INSERTION  07/27/2021   LEFT HEART CATH AND CORONARY ANGIOGRAPHY Left    Procedure: LEFT HEART CATHETERIZATION AND CORONARY ANGIOGRAPHY; Location: [facility]; Surgeon: Gerhardt Mana and Zachary Prentice Car, MD   LEFT HEART CATH AND CORONARY ANGIOGRAPHY Left 05/30/2010   Procedure: LEFT HEART CATH AND CORONARY ANGIOGRAPHY; Location: [facility]; Surgeon: Vinie Jude, MD   LEFT LOWER PULMONARY LOBECTOMY AND LYMPH NODE DISSECTION Left  02/06/2021   PORTA CATH INSERTION     Right lower extremity BKA Right 2020   Family History  Problem Relation Age of Onset   Heart disease Mother    Diabetes Mother    Heart disease Father    Diabetes Father    Social History   Tobacco Use   Smoking status: Former    Current packs/day: 0.00    Types: Cigars, Cigarettes    Quit date: 01/17/2021    Years since quitting: 2.9   Smokeless tobacco: Never  Substance Use Topics   Alcohol  use: Never   LABS:   Hospital Outpatient Visit on 01/02/2024  Component Date Value Ref Range Status   ABO/RH(D) 01/02/2024 B POS   Final   Antibody Screen 01/02/2024 NEG   Final   Sample Expiration 01/02/2024 01/16/2024,2359   Final   Extend sample reason 01/02/2024    Final                   Value:NO TRANSFUSIONS OR PREGNANCY IN THE PAST 3 MONTHS Performed at [facility], [address] [place], [place] [address]    WBC 01/02/2024 5.9  4.0 - 10.5 K/uL Final   RBC 01/02/2024 3.06 (L)  3.87 - 5.11 MIL/uL Final   Hemoglobin 01/02/2024 9.1 (L)  12.0 - 15.0 g/dL Final   HCT 92/83/7974 29.0 (L)  36.0 - 46.0 % Final   MCV 01/02/2024 94.8  80.0 - 100.0 fL Final   MCH 01/02/2024 29.7  26.0 - 34.0 pg Final   MCHC 01/02/2024 31.4  30.0 - 36.0 g/dL Final   RDW 92/83/7974 13.5  11.5 - 15.5 % Final   Platelets 01/02/2024 295  150 - 400 K/uL Final   nRBC 01/02/2024 0.0  0.0 - 0.2 % Final   Neutrophils Relative % 01/02/2024 70  % Final   Neutro Abs 01/02/2024 4.1  1.7 - 7.7 K/uL Final   Lymphocytes Relative 01/02/2024 14  % Final   Lymphs Abs 01/02/2024 0.8  0.7 - 4.0 K/uL Final   Monocytes Relative 01/02/2024 8  % Final   Monocytes Absolute 01/02/2024 0.5  0.1 - 1.0 K/uL Final   Eosinophils Relative 01/02/2024 8  % Final   Eosinophils Absolute 01/02/2024 0.5  0.0 - 0.5 K/uL Final   Basophils Relative 01/02/2024 0  % Final   Basophils Absolute 01/02/2024 0.0  0.0 - 0.1 K/uL Final   Immature Granulocytes 01/02/2024 0  % Final   Abs Immature Granulocytes 01/02/2024 0.00  0.00 - 0.07 K/uL Final   Performed at [facility], [address] [place], [place] [address]   Sodium 01/02/2024 141  135 - 145 mmol/L Final   Potassium 01/02/2024 3.4 (L)  3.5 - 5.1 mmol/L Final   Chloride 01/02/2024 110  98 - 111 mmol/L Final   CO2 01/02/2024 19 (L)  22 - 32 mmol/L Final   Glucose, Bld 01/02/2024 193 (H)  70 - 99 mg/dL Final   Glucose reference range applies only to samples taken after fasting for at least  8 hours.   BUN 01/02/2024 76 (H)  8 - 23 mg/dL Final   Creatinine, Ser 01/02/2024 4.03 (H)  0.44 - 1.00 mg/dL Final   Calcium  01/02/2024 8.0 (L)  8.9 - 10.3 mg/dL Final   GFR, Estimated 01/02/2024 12 (L)  >60 mL/min Final   Comment: (NOTE) Calculated using the CKD-EPI Creatinine Equation (2021)    Anion gap 01/02/2024 12  5 - 15 Final   Performed  at [facility], [address] [place], [place] [address]   MRSA, PCR 01/02/2024 NEGATIVE  NEGATIVE Final   Staphylococcus aureus 01/02/2024 NEGATIVE  NEGATIVE Final   Comment: (NOTE) The Xpert SA Assay (FDA approved for NASAL specimens in patients 26 years of age and older), is one component of a comprehensive surveillance program. It is not intended to diagnose infection nor to guide or monitor treatment. Performed at [facility], [address] [place], [place] [address]     ECG: Date: 01/02/2024  Time ECG obtained: 1027 AM Rate: 70 bpm Rhythm: Sinus rhythm with PACs Axis (leads I and aVF): normal Intervals: PR 160 ms. QRS 94 ms. QTc 544 ms. ST segment and T wave changes: No evidence of acute T wave abnormalities or significant ST segment elevation or depression.  Evidence of a possible, age undetermined, prior infarct: No Comparison: Similar to previous tracing obtained on 08/02/2021   IMAGING / PROCEDURES: TRANSTHORACIC ECHOCARDIOGRAM performed on 12/31/2023 Left ventricular systolic function normal with an EF of 50-55% No regional wall motion abnormalities No LVH Left ventricular diastolic Doppler parameters consistent with pseudonormalization (G2DD). Right ventricular size and function normal Mild MR PASP cannot be estimated due to insignificant TR signal Normal gradients; no valvular stenosis  CT CHEST WO CONTRAST performed on 04/24/2023 Stable surgical changes along left hemithorax. No new dominant mass or lymph node enlargement. Increasing small left and new tiny right pleural effusion however.  Please correlate with etiology and history. Otherwise recommend short follow-up. Aortic atherosclerosis  Emphysema  IMPRESSION AND PLAN: TINLEE NAVARRETTE has been referred for pre-anesthesia review and clearance prior to her undergoing the planned anesthetic and procedural courses. Available labs, pertinent testing, and imaging results were personally reviewed by me in preparation for upcoming operative/procedural course. [facility] medical record has been updated following extensive record review and patient interview with PAT staff.   ATTENTION --> PENDING CLEARANCE AT THIS TIME -- NOTE/CONTENTS NOT FINAL UNTIL SIGNED This patient has been appropriately cleared by cardiology with an overall *** risk of patient experiencing significant perioperative cardiovascular complications. Based on clinical review performed today (01/07/24), barring any significant acute changes in the patient's overall condition, it is anticipated that she will be able to proceed with the planned surgical intervention. Any acute changes in clinical condition may necessitate her procedure being postponed and/or cancelled. Patient will meet with anesthesia team (MD and/or CRNA) on the day of her procedure for preoperative evaluation/assessment. Questions regarding anesthetic course will be fielded at that time.   Pre-surgical instructions were reviewed with the patient during his PAT appointment, and questions were fielded to satisfaction by PAT clinical staff. She has been instructed on which medications that she will need to hold prior to surgery, as well as the ones that have been deemed safe/appropriate to take on the day of her procedure. As part of the general education provided by PAT, patient made aware both verbally and in writing, that she would need to abstain from the use of any illegal substances during her perioperative course. She was advised that failure to follow the provided instructions could necessitate case  cancellation or result in serious perioperative complications up to and including death. Patient encouraged to contact PAT and/or her surgeon's office to discuss any questions or concerns that may arise prior to surgery; verbalized understanding.   Dorise Pereyra, MSN, APRN, FNP-C, CEN [facility]  Perioperative Services Nurse Practitioner Phone: [phone] Fax: [phone] 01/07/24 2:32 PM  NOTE:  This note has been prepared using Scientist, clinical (histocompatibility and immunogenetics). Despite my best ability to proofread, there is always the potential that unintentional transcriptional errors may still occur from this process.

## 2024-01-09 ENCOUNTER — Encounter: Payer: Self-pay | Admitting: Vascular Surgery

## 2024-01-10 ENCOUNTER — Ambulatory Visit: Payer: Self-pay | Admitting: Urgent Care

## 2024-01-10 ENCOUNTER — Telehealth (INDEPENDENT_AMBULATORY_CARE_PROVIDER_SITE_OTHER): Payer: Self-pay

## 2024-01-10 ENCOUNTER — Encounter
Admission: RE | Disposition: A | Discharge status: Home / Self Care | Payer: Self-pay | Source: Home / Self Care | Attending: Vascular Surgery

## 2024-01-10 ENCOUNTER — Ambulatory Visit
Admission: RE | Admit: 2024-01-10 | Discharge: 2024-01-10 | Disposition: A | Discharge status: Home / Self Care | Attending: Vascular Surgery | Admitting: Vascular Surgery

## 2024-01-10 ENCOUNTER — Encounter: Payer: Self-pay | Admitting: Vascular Surgery

## 2024-01-10 ENCOUNTER — Other Ambulatory Visit: Payer: Self-pay

## 2024-01-10 DIAGNOSIS — I252 Old myocardial infarction: Secondary | ICD-10-CM | POA: Insufficient documentation

## 2024-01-10 DIAGNOSIS — N185 Chronic kidney disease, stage 5: Secondary | ICD-10-CM | POA: Diagnosis not present

## 2024-01-10 DIAGNOSIS — F32A Depression, unspecified: Secondary | ICD-10-CM | POA: Insufficient documentation

## 2024-01-10 DIAGNOSIS — F1410 Cocaine abuse, uncomplicated: Secondary | ICD-10-CM | POA: Diagnosis not present

## 2024-01-10 DIAGNOSIS — E1122 Type 2 diabetes mellitus with diabetic chronic kidney disease: Secondary | ICD-10-CM | POA: Insufficient documentation

## 2024-01-10 DIAGNOSIS — I2510 Atherosclerotic heart disease of native coronary artery without angina pectoris: Secondary | ICD-10-CM | POA: Diagnosis not present

## 2024-01-10 DIAGNOSIS — D631 Anemia in chronic kidney disease: Secondary | ICD-10-CM | POA: Diagnosis not present

## 2024-01-10 DIAGNOSIS — E1151 Type 2 diabetes mellitus with diabetic peripheral angiopathy without gangrene: Secondary | ICD-10-CM | POA: Diagnosis not present

## 2024-01-10 DIAGNOSIS — E785 Hyperlipidemia, unspecified: Secondary | ICD-10-CM | POA: Diagnosis not present

## 2024-01-10 DIAGNOSIS — I132 Hypertensive heart and chronic kidney disease with heart failure and with stage 5 chronic kidney disease, or end stage renal disease: Secondary | ICD-10-CM | POA: Diagnosis not present

## 2024-01-10 DIAGNOSIS — I5022 Chronic systolic (congestive) heart failure: Secondary | ICD-10-CM | POA: Diagnosis not present

## 2024-01-10 DIAGNOSIS — Z89511 Acquired absence of right leg below knee: Secondary | ICD-10-CM | POA: Insufficient documentation

## 2024-01-10 DIAGNOSIS — N186 End stage renal disease: Secondary | ICD-10-CM | POA: Diagnosis not present

## 2024-01-10 DIAGNOSIS — Z992 Dependence on renal dialysis: Secondary | ICD-10-CM | POA: Insufficient documentation

## 2024-01-10 HISTORY — DX: Anemia in chronic kidney disease: D63.1

## 2024-01-10 HISTORY — DX: Other cerebrovascular disease: I67.89

## 2024-01-10 HISTORY — DX: Chronic kidney disease, stage 5: N18.5

## 2024-01-10 HISTORY — DX: Anemia in chronic kidney disease: N18.9

## 2024-01-10 HISTORY — PX: INSERTION OF ARTERIOVENOUS (AV) ARTEGRAFT ARM: SHX6779

## 2024-01-10 HISTORY — DX: Type 2 diabetes mellitus without complications: E11.9

## 2024-01-10 HISTORY — DX: Malignant neoplasm of unspecified part of left bronchus or lung: C34.92

## 2024-01-10 HISTORY — DX: Long term (current) use of aspirin: Z79.82

## 2024-01-10 HISTORY — DX: Disorders of diaphragm: J98.6

## 2024-01-10 HISTORY — DX: Cardiac murmur, unspecified: R01.1

## 2024-01-10 LAB — GLUCOSE, CAPILLARY
Glucose-Capillary: 147 mg/dL — ABNORMAL HIGH (ref 70–99)
Glucose-Capillary: 183 mg/dL — ABNORMAL HIGH (ref 70–99)

## 2024-01-10 SURGERY — INSERTION, GRAFT, ARTERIOVENOUS, UPPER EXTREMITY
Anesthesia: General | Laterality: Left

## 2024-01-10 MED ORDER — SODIUM CHLORIDE 0.9 % IV SOLN: INTRAVENOUS | Status: DC

## 2024-01-10 MED ORDER — MIDAZOLAM HCL 2 MG/2ML IJ SOLN
INTRAMUSCULAR | Status: DC | PRN
  Administered 2024-01-10: 2 mg via INTRAVENOUS

## 2024-01-10 MED ORDER — GLYCOPYRROLATE 0.2 MG/ML IJ SOLN
INTRAMUSCULAR | Status: DC | PRN
Start: 2024-01-10 — End: 2024-01-10
  Administered 2024-01-10: .2 mg via INTRAVENOUS

## 2024-01-10 MED ORDER — LIDOCAINE HCL (CARDIAC) PF 100 MG/5ML IV SOSY
PREFILLED_SYRINGE | INTRAVENOUS | Status: DC | PRN
  Administered 2024-01-10: 60 mg via INTRAVENOUS

## 2024-01-10 MED ORDER — CHLORHEXIDINE GLUCONATE CLOTH 2 % EX PADS: 6.0000 | MEDICATED_PAD | Freq: Once | CUTANEOUS | Status: DC

## 2024-01-10 MED ORDER — ONDANSETRON HCL 4 MG/2ML IJ SOLN
INTRAMUSCULAR | Status: DC | PRN
  Administered 2024-01-10 (×2): 4 mg via INTRAVENOUS

## 2024-01-10 MED ORDER — FENTANYL CITRATE (PF) 100 MCG/2ML IJ SOLN
INTRAMUSCULAR | Status: AC
  Filled 2024-01-10: qty 2

## 2024-01-10 MED ORDER — FENTANYL CITRATE (PF) 100 MCG/2ML IJ SOLN: 25.0000 ug | INTRAMUSCULAR | Status: DC | PRN

## 2024-01-10 MED ORDER — EPHEDRINE SULFATE-NACL 50-0.9 MG/10ML-% IV SOSY
PREFILLED_SYRINGE | INTRAVENOUS | Status: DC | PRN
  Administered 2024-01-10 (×2): 5 mg via INTRAVENOUS

## 2024-01-10 MED ORDER — BUPIVACAINE-EPINEPHRINE (PF) 0.5% -1:200000 IJ SOLN
INTRAMUSCULAR | Status: AC
  Filled 2024-01-10: qty 300

## 2024-01-10 MED ORDER — CHLORHEXIDINE GLUCONATE 0.12 % MT SOLN
OROMUCOSAL | Status: AC
Start: 2024-01-10 — End: 2024-01-10
  Filled 2024-01-10: qty 15

## 2024-01-10 MED ORDER — DEXAMETHASONE SODIUM PHOSPHATE 10 MG/ML IJ SOLN
INTRAMUSCULAR | Status: DC | PRN
  Administered 2024-01-10: 5 mg via INTRAVENOUS

## 2024-01-10 MED ORDER — OXYCODONE HCL 5 MG PO TABS
ORAL_TABLET | ORAL | Status: AC
Start: 2024-01-10 — End: 2024-01-10
  Filled 2024-01-10: qty 1

## 2024-01-10 MED ORDER — TRAMADOL HCL 50 MG PO TABS: 50.0000 mg | ORAL_TABLET | Freq: Four times a day (QID) | ORAL | 0 refills | Status: DC | PRN

## 2024-01-10 MED ORDER — PHENYLEPHRINE 80 MCG/ML (10ML) SYRINGE FOR IV PUSH (FOR BLOOD PRESSURE SUPPORT)
PREFILLED_SYRINGE | INTRAVENOUS | Status: DC | PRN
  Administered 2024-01-10 (×3): 80 ug via INTRAVENOUS

## 2024-01-10 MED ORDER — FENTANYL CITRATE (PF) 100 MCG/2ML IJ SOLN
INTRAMUSCULAR | Status: DC | PRN
  Administered 2024-01-10 (×2): 25 ug via INTRAVENOUS

## 2024-01-10 MED ORDER — CEFAZOLIN SODIUM-DEXTROSE 2-4 GM/100ML-% IV SOLN
2.0000 g | INTRAVENOUS | Status: AC
  Administered 2024-01-10: 2 g via INTRAVENOUS

## 2024-01-10 MED ORDER — CHLORHEXIDINE GLUCONATE 0.12 % MT SOLN
15.0000 mL | Freq: Once | OROMUCOSAL | Status: AC
  Administered 2024-01-10: 15 mL via OROMUCOSAL

## 2024-01-10 MED ORDER — HEPARIN SODIUM (PORCINE) 5000 UNIT/ML IJ SOLN
INTRAMUSCULAR | Status: AC
  Filled 2024-01-10: qty 1

## 2024-01-10 MED ORDER — ACETAMINOPHEN 10 MG/ML IV SOLN
INTRAVENOUS | Status: AC
  Filled 2024-01-10: qty 100

## 2024-01-10 MED ORDER — OXYCODONE HCL 5 MG PO TABS
5.0000 mg | ORAL_TABLET | Freq: Once | ORAL | Status: AC | PRN
  Administered 2024-01-10: 5 mg via ORAL

## 2024-01-10 MED ORDER — HEPARIN SODIUM (PORCINE) 1000 UNIT/ML IJ SOLN
INTRAMUSCULAR | Status: DC | PRN
  Administered 2024-01-10: 3000 [IU] via INTRAVENOUS

## 2024-01-10 MED ORDER — CEFAZOLIN SODIUM-DEXTROSE 2-4 GM/100ML-% IV SOLN
INTRAVENOUS | Status: AC
Start: 2024-01-10 — End: 2024-01-10
  Filled 2024-01-10: qty 100

## 2024-01-10 MED ORDER — ORAL CARE MOUTH RINSE: 15.0000 mL | Freq: Once | OROMUCOSAL | Status: AC

## 2024-01-10 MED ORDER — PROPOFOL 10 MG/ML IV BOLUS
INTRAVENOUS | Status: DC | PRN
  Administered 2024-01-10: 200 mg via INTRAVENOUS

## 2024-01-10 MED ORDER — MIDAZOLAM HCL 2 MG/2ML IJ SOLN
INTRAMUSCULAR | Status: AC
  Filled 2024-01-10: qty 2

## 2024-01-10 MED ORDER — ACETAMINOPHEN 10 MG/ML IV SOLN
INTRAVENOUS | Status: DC | PRN
  Administered 2024-01-10: 1000 mg via INTRAVENOUS

## 2024-01-10 MED ORDER — SODIUM CHLORIDE 0.9 % IV SOLN
INTRAVENOUS | Status: DC | PRN
  Administered 2024-01-10: 501 mL

## 2024-01-10 MED ORDER — OXYCODONE HCL 5 MG/5ML PO SOLN: 5.0000 mg | Freq: Once | ORAL | Status: AC | PRN

## 2024-01-10 SURGICAL SUPPLY — 43 items
BAG DECANTER FOR FLEXI CONT (MISCELLANEOUS) ×1 IMPLANT
BLADE SURG SZ11 CARB STEEL (BLADE) ×1 IMPLANT
BRUSH SCRUB EZ 4% CHG (MISCELLANEOUS) ×1 IMPLANT
CHLORAPREP W/TINT 26 (MISCELLANEOUS) ×1 IMPLANT
CLAMP SUTURE YELLOW 5 PAIRS (MISCELLANEOUS) ×1 IMPLANT
CLEANSER WND VASHE 34 (WOUND CARE) IMPLANT
CLIP SPRNG 6 S-JAW DBL (CLIP) ×1 IMPLANT
COVER LIGHT HANDLE STERIS (MISCELLANEOUS) IMPLANT
DERMABOND ADVANCED .7 DNX12 (GAUZE/BANDAGES/DRESSINGS) IMPLANT
DRESSING SURGICEL FIBRLLR 1X2 (HEMOSTASIS) ×1 IMPLANT
ELECT CAUTERY BLADE 6.4 (BLADE) ×1 IMPLANT
ELECTRODE REM PT RTRN 9FT ADLT (ELECTROSURGICAL) ×1 IMPLANT
GLOVE BIO SURGEON STRL SZ7 (GLOVE) ×2 IMPLANT
GOWN STRL REUS W/ TWL LRG LVL3 (GOWN DISPOSABLE) ×2 IMPLANT
GOWN STRL REUS W/TWL 2XL LVL3 (GOWN DISPOSABLE) ×1 IMPLANT
GRAFT PROPATEN STD WALL 6X40 (Vascular Products) IMPLANT
HEMOSTAT HEMOBLAST BELLOWS (HEMOSTASIS) IMPLANT
IV NS 500ML BAXH (IV SOLUTION) ×1 IMPLANT
KIT TURNOVER KIT A (KITS) ×1 IMPLANT
LABEL OR SOLS (LABEL) ×1 IMPLANT
LOOP VESSEL MAXI 1X406 RED (MISCELLANEOUS) ×1 IMPLANT
LOOP VESSEL MINI 0.8X406 BLUE (MISCELLANEOUS) ×1 IMPLANT
MANIFOLD NEPTUNE II (INSTRUMENTS) ×1 IMPLANT
NDL FILTER BLUNT 18X1 1/2 (NEEDLE) ×1 IMPLANT
NEEDLE FILTER BLUNT 18X1 1/2 (NEEDLE) ×1 IMPLANT
PACK EXTREMITY ARMC (MISCELLANEOUS) ×1 IMPLANT
PENCIL SMOKE EVACUATOR (MISCELLANEOUS) ×1 IMPLANT
SOLUTION CELL SAVER (CLIP) IMPLANT
SPIKE FLUID TRANSFER (MISCELLANEOUS) ×1 IMPLANT
STOCKINETTE 48X4 2 PLY STRL (GAUZE/BANDAGES/DRESSINGS) ×1 IMPLANT
STOCKINETTE STRL 4IN 9604848 (GAUZE/BANDAGES/DRESSINGS) ×1 IMPLANT
SUT GORETEX 5-0 CV-6/TTC-9 (SUTURE) ×1 IMPLANT
SUT GORETEX 6.0 TT9 (SUTURE) IMPLANT
SUT MNCRL AB 4-0 PS2 18 (SUTURE) ×1 IMPLANT
SUT PROLENE 6 0 BV (SUTURE) ×4 IMPLANT
SUT SILK 0 SH 30 (SUTURE) ×1 IMPLANT
SUT SILK 2-0 18XBRD TIE 12 (SUTURE) ×1 IMPLANT
SUT SILK 3-0 18XBRD TIE 12 (SUTURE) ×1 IMPLANT
SUT SILK 4-0 18XBRD TIE 12 (SUTURE) ×1 IMPLANT
SUT VIC AB 3-0 SH 27X BRD (SUTURE) ×1 IMPLANT
SYR 20ML LL LF (SYRINGE) ×1 IMPLANT
SYR 3ML LL SCALE MARK (SYRINGE) ×1 IMPLANT
TRAP FLUID SMOKE EVACUATOR (MISCELLANEOUS) ×1 IMPLANT

## 2024-01-10 NOTE — Op Note (Signed)
 [facility]  OPERATIVE NOTE   PROCEDURE:  Left upper arm brachial artery to axillary vein arteriovenous graft  PRE-OPERATIVE DIAGNOSIS: 1. CKD stage 5   POST-OPERATIVE DIAGNOSIS: same  SURGEON: [name] [name]  ASSISTANT(S): none  ANESTHESIA: general  ESTIMATED BLOOD LOSS: 25 cc  FINDING(S): No usable superficial veins for fistula creation  SPECIMEN(S):  None  INDICATIONS:   [name] [name] is a 68 y.o. female who presents with CKD stage 5 nearing need for dialysis.  Risk, benefits, and alternatives to access surgery were discussed.  The patient is aware the risks include but are not limited to: bleeding, infection, steal syndrome, nerve damage, ischemic monomelic neuropathy, failure to mature, and need for additional procedures.  The patient is aware of the risks and elects to proceed forward.  DESCRIPTION: After full informed written consent was obtained from the patient, the patient was brought back to the operating room and placed supine upon the operating table.  The patient was given IV antibiotics prior to proceeding.  After obtaining adequate sedation, the patient was prepped and draped in standard fashion for a Left arm access procedure.  I turned my attention first to the antecubitum.   I made an incision over the brachial artery, and dissected down through the subcutaneous tissue to the fascia carefully and was able to dissect out the brachial artery.  The artery was about patent and of adequate size to support a graft.  It was controlled proximally and distally with vessel loops and then I turned my attention to the high bicipital groove in the axilla.  I made an incision and dissected down through the subcutaneous tissue and fascia until I reached the axillary vein.  It was patent and adequate size for graft creation.  I then dissected this vein proximally and distal and prepared it for control with Bulldog clamps.  I took a Company secretary and  dissected from the antecubital up to the axillary incision.  Then I delivered the 6 mm standard wall Propaten pTFE graft through this metal tunneler and then pulled out the metal tunneler leaving the graft in place and making sure the line was up for orientation.  I then gave the patient 3000 units of heparin  to gain some anticoagulation.  After waiting 3 minutes, I placed the brachial artery under tension proximally and distally with vessel loops, made an arteriotomy and extended it with a Potts scissor.  I sewed the graft to this arteriotomy with a running stitch of CV-6 suture.  At this point, then I completed the anastomosis in the usual fashion.  I released the vessel loops on the inflow and allowed the artery to decompress through the graft. There was good pulsatile bleeding through this graft.  I clamped the graft near its arterial anastomosis and sucked out all the blood in the graft and loaded the graft with heparinized saline.  At this point, I pulled the graft to appropriate length and reset my exposure of the axillary vein.  Then, I controlled the vein with Bulldog clamps.  An anterior wall venotomy was created with an 11 blade and extended with Potts scissors.  I then spatulated the graft to facilitate an end-to- side anastomosis matching the arteriotomy.  In the process of spatulating, I cut the graft to appropriate length for this anastomosis.  This graft was sewn to the vein in an end-to-side configuration with a running CV-6 suture.  Prior to completing this anastomosis, I allowed the vein to back  bleed and then I also allowed the artery to bleed in an antegrade fashion.  I completed this anastomosis in the usual fashion, and then irrigated out the high bicipital exposure and then placed Fibrillar and Hemoblast.  I then turned my attention back to the antecubitum.  There was pulsatile flow in the artery beyond the graft.   At this point, I washed out the antecubital incision. Fibrillar and Hemoblast  were then placed. There was no more active bleeding.  The subcutaneous tissue was reapproximated with a running stitch of 3-0 Vicryl.  The skin was then reapproximated with a running subcuticular 4-0 Monocryl.  The skin was then cleaned, dried, and Dermabond used to reinforce the skin closure.  We then turned our attention to the axillary incision.  The subcutaneous tissue was repaired with running stitch of 3-0 Vicryl.  The skin was then reapproximated with running subcuticular 4-0 Monocryl.  The skin was then cleaned, dried, and then the skin closure was reinforced with Dermabond.  The patient was then awakened from anesthesia and taken to the recovery room in stable condition having tolerated the procedure well.    COMPLICATIONS: None  CONDITION: Stable   [name] [name] 01/10/2024 9:46 AM   This note was created with Dragon Medical transcription system. Any errors in dictation are purely unintentional.

## 2024-01-10 NOTE — Interval H&P Note (Signed)
 History and Physical Interval Note:  01/10/2024 7:17 AM  [name] [name]  has presented today for surgery, with the diagnosis of CHRONIC KIDNEY DISEASE STAGE 5.  The various methods of treatment have been discussed with the patient and family. After consideration of risks, benefits and other options for treatment, the patient has consented to  Procedure(s) with comments: INSERTION, GRAFT, ARTERIOVENOUS, UPPER EXTREMITY (Left) - BRACHIAL AXILLARY as a surgical intervention.  The patient's history has been reviewed, patient examined, no change in status, stable for surgery.  I have reviewed the patient's chart and labs.  Questions were answered to the patient's satisfaction.     Kolina Kube

## 2024-01-10 NOTE — Anesthesia Preprocedure Evaluation (Signed)
 Anesthesia Evaluation  Patient identified by MRN, date of birth, ID band Patient awake    Reviewed: Allergy & Precautions, H&P , NPO status , Patient's Chart, lab work & pertinent test results  Airway Mallampati: II  TM Distance: >3 FB Neck ROM: full    Dental  (+) Chipped   Pulmonary neg pulmonary ROS, former smoker Lung CA   Pulmonary exam normal breath sounds clear to auscultation       Cardiovascular hypertension, + CAD, + Past MI, + Peripheral Vascular Disease, +CHF and + DVT  negative cardio ROS Normal cardiovascular exam Rhythm:regular Rate:Normal  Echo 01/14/21: IMPRESSIONS  1. Left ventricular ejection fraction, by estimation, is 60 to 65%. The  left ventricle has normal function. The left ventricle has no regional  wall motion abnormalities. There is mild asymmetric left ventricular  hypertrophy of the basal-septal segment.  Left ventricular diastolic parameters are consistent with Grade I  diastolic dysfunction (impaired relaxation).  2. Right ventricular systolic function is normal. The right ventricular  size is normal. There is normal pulmonary artery systolic pressure. The  estimated right ventricular systolic pressure is 25.3 mmHg.  3. The mitral valve is normal in structure. Trivial mitral valve  regurgitation. No evidence of mitral stenosis.  4. The aortic valve is tricuspid. Aortic valve regurgitation is not  visualized. No aortic stenosis is present.  5. The inferior vena cava is normal in size with greater than 50%  respiratory variability, suggesting right atrial pressure of 3 mmHg.   Comparison echocardiogram: - TTE 02/16/20 @ [facility] in setting of acute PE/DVT: LVEF 20-25%, global hypokinesis with relative akinesis of the basal to apical segments of the septum, grade II DD, abnormal ventricular septal motion c/w RV pressure overload/systolic flattening, moderately reduced RVSF, mild MR, moderate TR,  moderate pulmonary hypertension, estimated PASP 45 mmHg, IVC size and inspiratory changes suggests mildly elevated RA press 5-10 mmHg, small pericardial effusion, bilateral pleural effusions;  - TTE 11/27/19 @ [facility]: LVEF > 55%, mild MR, mild pulmonary hypertension, normal RV systolic function, no interatrial communication or intrapulmonary shunt by agitated saline contrast study   Cardiac cath 02/20/20 [facility]): Hemodynamics: Aortic pressure: 107/64 mm Hg (mean 82 mm Hg)  LVEDP = 13 mm Hg  Coronary Angiography:  Dominance: Left  Left Main: The left main coronary artery (LMCA) is a large-caliber vessel that originates from the left coronary sinus. It bifurcates into the left anterior descending (LAD) and left circumflex (LCx) arteries. There is no angiographic evidence of significant disease in the LMCA. LAD: The LAD is a large-caliber vessel that gives off 3 diagonal (D) branches before it wraps around the apex. D1 is a large-caliber vessel. D2 is a moderate-caliber vessel with a 50% stenosis. D3 is a small-caliber vessel. There is a long 25% stenosis in the proximal LAD and a focal 50% stenosis in the mid-LAD. Left Circumflex: The LCx is a large-caliber vessel that gives off 2 obtuse marginal (OM) branches and then continues as a small vessel in the AV groove. OM1 is a large-caliber branching vessel with an 80% proximal stenosis. OM2 is a small-caliber vessel. Right Coronary: The right coronary artery (RCA) is a large-caliber vessel originating from the right coronary sinus. There is a total occlusion of the proximal RCA, with left to right collaterals.  Findings:  1. Coronary artery disease including occluded proximal RCA (distal vessel  fills via left to right collaterals), 50% mid LAD stenosis and 80% OM1  stenosis.  2. Normal left ventricular filling  pressures (LVEDP = 13 mm Hg).   Recommendations:  1. Aggressive secondary prevention.  2. Medical management on CAD and CHF.  3. Follow up with  primary cardiologist.      Neuro/Psych  PSYCHIATRIC DISORDERS  Depression    negative neurological ROS  negative psych ROS   GI/Hepatic negative GI ROS, Neg liver ROS,,,  Endo/Other  negative endocrine ROSdiabetes    Renal/GU negative Renal ROS  negative genitourinary   Musculoskeletal   Abdominal   Peds  Hematology negative hematology ROS (+) Blood dyscrasia, anemia   Anesthesia Other Findings Past Medical History: No date: Adenocarcinoma of left lung (HCC)     Comment:  a.) stage IIIA (pT1c, pN2, cM0) --> s/p LLL lobectomy +               4 cycles of carbo/taxol  + 1 cycle atezolizumab                (discontinued for grade IV toxicity) No date: Anemia of chronic renal failure No date: Cerebral microvascular disease No date: CKD (chronic kidney disease), stage V (HCC) No date: Cocaine abuse (HCC) No date: Coronary artery disease     Comment:  a.) LHC (NSTEMI) 05/30/2010: 10% pLCxm 20% pRCA-1, 100%               pRCA-2 - med mgmt; b.) LHC 02/24/2020: CTA pRCA with good              L-R collaterals, 50% mLAD, 80% OM1 - med mgmt No date: Depression No date: Dyspnea No date: Elevated LEFT hemidiaphragm 02/16/2020: HFrEF (heart failure with reduced ejection fraction) (HCC)     Comment:  a.) Dx'd in setting of acute DVT/PE No date: Hyperlipidemia No date: Hypertension No date: Long-term use of aspirin  therapy 05/28/2010: NSTEMI (non-ST elevated myocardial infarction) (HCC)     Comment:  a.) troponins were trended: 1.70 --> 3.30 --> 4.90               ng/mL; b.) LHC 05/30/2010: 10% pLCx, 20% pRCA-1, 100%               pRCA-2 (chronic) with good collaterals - med mgmt No date: Peripheral artery disease (HCC)     Comment:  a.) 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 No date: Systolic ejection murmur No date: T2DM (type 2 diabetes mellitus) (HCC) 02/15/2020: Venous thromboembolism (VTE)     Comment:  a.) 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; CTA (+) for               multiple RIGHT  segmental/subsegment pulmonary emboli  Past Surgical History: 01/06/2020: BELOW KNEE LEG AMPUTATION; Right 07/27/2021: IR IMAGING GUIDED PORT INSERTION No date: LEFT HEART CATH AND CORONARY ANGIOGRAPHY; Left     Comment:  Procedure: LEFT HEART CATHETERIZATION AND CORONARY               ANGIOGRAPHY; Location: [facility]; Surgeon: Gerhardt Mana and               Zachary Prentice Car, MD 05/30/2010: LEFT HEART CATH AND CORONARY ANGIOGRAPHY; Left     Comment:  Procedure: LEFT HEART CATH AND CORONARY ANGIOGRAPHY;               Location: [facility]; Surgeon: Vinie Jude, MD 02/06/2021: LEFT LOWER PULMONARY LOBECTOMY AND LYMPH NODE DISSECTION;  Left No date: PORTA CATH INSERTION  BMI    Body Mass Index: 17.94 kg/m      Reproductive/Obstetrics negative OB ROS  Anesthesia Physical Anesthesia Plan  ASA: 3  Anesthesia Plan: General   Post-op Pain Management:    Induction: Intravenous  PONV Risk Score and Plan: 3 and Ondansetron , Dexamethasone  and Midazolam   Airway Management Planned: LMA  Additional Equipment:   Intra-op Plan:   Post-operative Plan: Extubation in OR  Informed Consent: I have reviewed the patients History and Physical, chart, labs and discussed the procedure including the risks, benefits and alternatives for the proposed anesthesia with the patient or authorized representative who has indicated his/her understanding and acceptance.     Dental Advisory Given  Plan Discussed with: Anesthesiologist, CRNA and Surgeon  Anesthesia Plan Comments: (Patient consented for risks of anesthesia including but not limited to:  - adverse reactions to medications - damage to eyes, teeth, lips or other oral mucosa - nerve damage due to positioning  - sore throat or hoarseness - Damage to heart, brain, nerves, lungs, other parts of body or loss of life  Patient voiced understanding and assent.)        Anesthesia Quick  Evaluation

## 2024-01-10 NOTE — Transfer of Care (Signed)
 Immediate Anesthesia Transfer of Care Note  Patient: [name] [name]  Procedure(s) Performed: INSERTION, GRAFT, ARTERIOVENOUS, UPPER EXTREMITY (Left)  Patient Location: PACU  Anesthesia Type:General  Level of Consciousness: drowsy and patient cooperative  Airway & Oxygen Therapy: Patient Spontanous Breathing and Patient connected to face mask oxygen  Post-op Assessment: Report given to RN and Post -op Vital signs reviewed and stable  Post vital signs: Reviewed and stable  Last Vitals:  Vitals Value Taken Time  BP 176/79 01/10/24 09:42  Temp    Pulse 79 01/10/24 09:45  Resp 15 01/10/24 09:45  SpO2 100 % 01/10/24 09:45  Vitals shown include unfiled device data.  Last Pain:  Vitals:   01/10/24 9361  TempSrc: Temporal  PainSc: 0-No pain         Complications: No notable events documented.

## 2024-01-10 NOTE — Telephone Encounter (Signed)
 Patient left a message for a callback regarding her pain medication. Patient was called and she stated she didn't know the side effects of the medication. I asked if she had read the side effects listed and she stated she had not, she also said she didn't want to take the medication. I asked what medication and it is Tramadol . I advised if she didn't want to take the medication that was her choice based on her pain.

## 2024-01-10 NOTE — Anesthesia Procedure Notes (Signed)
 Procedure Name: LMA Insertion Date/Time: 01/10/2024 7:36 AM  Performed by: Ledora Duncan, CRNAPre-anesthesia Checklist: Patient identified, Emergency Drugs available, Suction available and Patient being monitored Patient Re-evaluated:Patient Re-evaluated prior to induction Oxygen Delivery Method: Circle system utilized Preoxygenation: Pre-oxygenation with 100% oxygen Induction Type: IV induction Ventilation: Mask ventilation without difficulty LMA: LMA inserted LMA Size: 3.0 Placement Confirmation: positive ETCO2 and breath sounds checked- equal and bilateral Tube secured with: Tape Dental Injury: Teeth and Oropharynx as per pre-operative assessment

## 2024-01-10 NOTE — Anesthesia Postprocedure Evaluation (Signed)
 Anesthesia Post Note  Patient: [name] [name]  Procedure(s) Performed: INSERTION, GRAFT, ARTERIOVENOUS, UPPER EXTREMITY (Left)  Patient location during evaluation: PACU Anesthesia Type: General Level of consciousness: awake and alert Pain management: pain level controlled Vital Signs Assessment: post-procedure vital signs reviewed and stable Respiratory status: spontaneous breathing, nonlabored ventilation, respiratory function stable and patient connected to nasal cannula oxygen Cardiovascular status: blood pressure returned to baseline and stable Postop Assessment: no apparent nausea or vomiting Anesthetic complications: no   No notable events documented.   Last Vitals:  Vitals:   01/10/24 0944 01/10/24 0945  BP: (!) 176/79 (!) 166/80  Pulse: 80   Resp: 17 14  Temp: 36.7 C   SpO2: 100%     Last Pain:  Vitals:   01/10/24 0944  TempSrc:   PainSc: Asleep                 Debby Mines

## 2024-01-11 ENCOUNTER — Encounter: Payer: Self-pay | Admitting: Vascular Surgery

## 2024-01-15 ENCOUNTER — Ambulatory Visit: Admitting: Physician Assistant

## 2024-01-16 ENCOUNTER — Encounter: Payer: Self-pay | Admitting: Vascular Surgery

## 2024-01-18 ENCOUNTER — Telehealth: Payer: Self-pay

## 2024-01-18 ENCOUNTER — Other Ambulatory Visit: Payer: Self-pay | Admitting: Physician Assistant

## 2024-01-18 NOTE — Telephone Encounter (Signed)
 Copied from CRM #[id number]. Topic: Clinical - Medication Question >> Jan 18, 2024 12:56 PM Sophia H wrote: Reason for CRM: Patient states she received a message from Medicare that they no longer will be covering the contonious Dexcom meter that she uses, moving forward it will only be the contour meter/supplies and she will likely need new rx's. Please advise # [phone]

## 2024-01-18 NOTE — Telephone Encounter (Signed)
 Spoke with patient and she will follow up at appt.   JM

## 2024-01-20 ENCOUNTER — Other Ambulatory Visit: Payer: Self-pay

## 2024-01-20 ENCOUNTER — Emergency Department

## 2024-01-20 ENCOUNTER — Emergency Department
Admission: EM | Admit: 2024-01-20 | Discharge: 2024-01-20 | Disposition: A | Discharge status: Home / Self Care | Attending: Emergency Medicine | Admitting: Emergency Medicine

## 2024-01-20 DIAGNOSIS — I120 Hypertensive chronic kidney disease with stage 5 chronic kidney disease or end stage renal disease: Secondary | ICD-10-CM | POA: Diagnosis not present

## 2024-01-20 DIAGNOSIS — Z794 Long term (current) use of insulin: Secondary | ICD-10-CM | POA: Insufficient documentation

## 2024-01-20 DIAGNOSIS — E1122 Type 2 diabetes mellitus with diabetic chronic kidney disease: Secondary | ICD-10-CM | POA: Insufficient documentation

## 2024-01-20 DIAGNOSIS — Z79899 Other long term (current) drug therapy: Secondary | ICD-10-CM | POA: Insufficient documentation

## 2024-01-20 DIAGNOSIS — M7989 Other specified soft tissue disorders: Secondary | ICD-10-CM | POA: Diagnosis not present

## 2024-01-20 DIAGNOSIS — I129 Hypertensive chronic kidney disease with stage 1 through stage 4 chronic kidney disease, or unspecified chronic kidney disease: Secondary | ICD-10-CM | POA: Diagnosis not present

## 2024-01-20 DIAGNOSIS — Z7982 Long term (current) use of aspirin: Secondary | ICD-10-CM | POA: Insufficient documentation

## 2024-01-20 DIAGNOSIS — Z992 Dependence on renal dialysis: Secondary | ICD-10-CM | POA: Diagnosis not present

## 2024-01-20 DIAGNOSIS — N185 Chronic kidney disease, stage 5: Secondary | ICD-10-CM | POA: Diagnosis not present

## 2024-01-20 DIAGNOSIS — N189 Chronic kidney disease, unspecified: Secondary | ICD-10-CM | POA: Diagnosis not present

## 2024-01-20 DIAGNOSIS — I2510 Atherosclerotic heart disease of native coronary artery without angina pectoris: Secondary | ICD-10-CM | POA: Insufficient documentation

## 2024-01-20 MED ORDER — INSULIN ASPART 100 UNIT/ML IJ SOLN
3.0000 [IU] | Freq: Once | INTRAMUSCULAR | Status: AC
  Administered 2024-01-20: 3 [IU] via SUBCUTANEOUS
  Filled 2024-01-20: qty 3

## 2024-01-20 MED ORDER — CEPHALEXIN 500 MG PO CAPS: 500.0000 mg | ORAL_CAPSULE | Freq: Two times a day (BID) | ORAL | 0 refills | Status: DC

## 2024-01-20 MED ORDER — CEPHALEXIN 500 MG PO CAPS
500.0000 mg | ORAL_CAPSULE | Freq: Once | ORAL | Status: AC
  Administered 2024-01-20: 500 mg via ORAL
  Filled 2024-01-20: qty 1

## 2024-01-20 NOTE — ED Notes (Signed)
 [place] at bedside

## 2024-01-20 NOTE — ED Triage Notes (Addendum)
 Pt comes with left arm pain following graft insertion on Thursday for her dialysis. Pt has some redness and swelling noted to arm. Pt arm feels tight and sore to touch.   Pt is not on thinner.

## 2024-01-20 NOTE — ED Provider Notes (Signed)
 [facility] Provider Note    Event Date/Time   First MD Initiated Contact with Patient 01/20/24 1523     (approximate)   History   Chief Complaint: Arm Pain   HPI  [name] [name] is a 68 y.o. female with a history of hypertension diabetes cocaine abuse CKD who comes ED complaining of pain and swelling of the left arm after recent AV graft placement.  Outside records reviewed noting surgical graft placement on January 10, 2024 by vascular surgery Dr. Marea.  Patient reports since then she has had gradually worsening pain and swelling of the area.  No drainage, incisions intact, no trauma to the area.  No fever.  No chest pain or shortness of breath.  No new pain or paresthesia of the hand.        Past Medical History:  Diagnosis Date   Adenocarcinoma of left lung (HCC)    a.) stage IIIA (pT1c, pN2, cM0) --> s/p LLL lobectomy + 4 cycles of carbo/taxol  + 1 cycle atezolizumab  (discontinued for grade IV toxicity)   Anemia of chronic renal failure    Cerebral microvascular disease    CKD (chronic kidney disease), stage V (HCC)    Cocaine abuse (HCC)    Coronary artery disease    a.) LHC (NSTEMI) 05/30/2010: 10% pLCxm 20% pRCA-1, 100% pRCA-2 - med mgmt; b.) LHC 02/24/2020: CTA pRCA with good L-R collaterals, 50% mLAD, 80% OM1 - med mgmt   Depression    Dyspnea    Elevated LEFT hemidiaphragm    HFrEF (heart failure with reduced ejection fraction) (HCC) 02/16/2020   a.) Dx'd in setting of acute DVT/PE   Hyperlipidemia    Hypertension    Long-term use of aspirin  therapy    NSTEMI (non-ST elevated myocardial infarction) (HCC) 05/28/2010   a.) troponins were trended: 1.70 --> 3.30 --> 4.90 ng/mL; b.) LHC 05/30/2010: 10% pLCx, 20% pRCA-1, 100% pRCA-2 (chronic) with good collaterals - med mgmt   Peripheral artery disease (HCC)    a.) 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   Systolic ejection murmur    T2DM (type 2 diabetes mellitus) (HCC)    Venous thromboembolism (VTE)  02/15/2020   a.) 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; CTA (+) for multiple RIGHT segmental/subsegment pulmonary emboli    Current Outpatient Rx   Order #: [id number] Class: Normal   Order #: [id number] Class: Historical Med   Order #: [id number] Class: Historical Med   Order #: [id number] Class: Historical Med   Order #: [id number] Class: Historical Med   Order #: [id number] Class: Historical Med   Order #: [id number] Class: Historical Med   Order #: [id number] Class: Historical Med   Order #: [id number] Class: Historical Med   Order #: [id number] Class: Historical Med   Order #: [id number] Class: Normal   Order #: [id number] Class: Historical Med   Order #: [id number] Class: Historical Med   Order #: [id number] Class: Normal   Order #: [id number] Class: Historical Med   Order #: [id number] Class: Normal   Order #: [id number] Class: Normal   Order #: [id number] Class: Historical Med   Order #: [id number] Class: Historical Med   Order #: [id number] Class: Historical Med   Order #: [id number] Class: Normal    Past Surgical History:  Procedure Laterality Date   BELOW KNEE LEG AMPUTATION Right 01/06/2020   INSERTION OF ARTERIOVENOUS (AV) ARTEGRAFT ARM Left 01/10/2024   Procedure: INSERTION, GRAFT, ARTERIOVENOUS, UPPER EXTREMITY;  Surgeon: Marea Selinda RAMAN, MD;  Location: [facility];  Service: Vascular;  Laterality: Left;  BRACHIAL AXILLARY   IR IMAGING GUIDED PORT INSERTION  07/27/2021   LEFT HEART CATH AND CORONARY ANGIOGRAPHY Left    Procedure: LEFT HEART CATHETERIZATION AND CORONARY ANGIOGRAPHY; Location: [facility]; Surgeon: Gerhardt Mana and Zachary Prentice Car, MD   LEFT HEART CATH AND CORONARY ANGIOGRAPHY Left 05/30/2010   Procedure: LEFT HEART CATH AND CORONARY ANGIOGRAPHY; Location: [facility]; Surgeon: Vinie Jude, MD   LEFT LOWER PULMONARY LOBECTOMY AND LYMPH NODE DISSECTION Left 02/06/2021   PORTA CATH INSERTION      Physical Exam   Triage Vital Signs: ED Triage Vitals  Encounter Vitals Group     BP 01/20/24 1451 (!)  170/81     Girls Systolic BP Percentile --      Girls Diastolic BP Percentile --      Boys Systolic BP Percentile --      Boys Diastolic BP Percentile --      Pulse Rate 01/20/24 1451 90     Resp 01/20/24 1451 17     Temp 01/20/24 1451 98 F (36.7 C)     Temp src --      SpO2 01/20/24 1451 100 %     Weight 01/20/24 1449 107 lb 12.9 oz (48.9 kg)     Height 01/20/24 1449 5' 5 (1.651 m)     Head Circumference --      Peak Flow --      Pain Score 01/20/24 1449 8     Pain Loc --      Pain Education --      Exclude from Growth Chart --     Most recent vital signs: Vitals:   01/20/24 1800 01/20/24 1943  BP: (!) 166/78 (!) 161/84  Pulse: 89 78  Resp: 17 16  Temp:  98 F (36.7 C)  SpO2: 100% 100%    General: Awake, no distress.  CV:  Good peripheral perfusion.  Regular rate and rhythm Resp:  Normal effort.  Abd:  No distention.  Other:  Left upper extremity with 2 surgical incisions which are healing well, intact.  There is surrounding edema, erythema warmth tenderness over the medial upper arm extending around to the antecubital fossa and into the volar forearm.  No crepitus or fluctuance.  Normal pulsatility in the graft.   ED Results / Procedures / Treatments   Labs (all labs ordered are listed, but only abnormal results are displayed) Labs Reviewed - No data to display   EKG    RADIOLOGY Ultrasound left upper extremity negative for DVT.  Does show a small fluid collection in the antecubital fossa suspected to be a chronic hematoma.   PROCEDURES:  Procedures   MEDICATIONS ORDERED IN ED: Medications  cephALEXin  (KEFLEX ) capsule 500 mg (has no administration in time range)  insulin  aspart (novoLOG ) injection 3 Units (3 Units Subcutaneous Given 01/20/24 1616)     IMPRESSION / MDM / ASSESSMENT AND PLAN / ED COURSE  I reviewed the triage vital signs and the nursing notes.  DDx: Cellulitis, pseudoaneurysm, subcutaneous abscess, DVT.  Doubt necrotizing fasciitis  or osteomyelitis or septic arthritis.  Patient's presentation is most consistent with acute presentation with potential threat to life or bodily function.  Patient presents with pain swelling of the left upper arm after dialysis access graft placement 10 days ago.  Will obtain ultrasound left upper extremity.    ----------------------------------------- 8:03 PM on 01/20/2024 ----------------------------------------- Ultrasound reassuring, normal postoperative appearance.  Presentation discussed with vascular surgery Dr. Marea who advises that this appearance is a common foreign body response to graft material implantation.  Agrees with starting  Keflex  for now pending clinic follow-up.     FINAL CLINICAL IMPRESSION(S) / ED DIAGNOSES   Final diagnoses:  Left arm swelling  Stage 5 chronic kidney disease not on chronic dialysis (HCC)     Rx / DC Orders   ED Discharge Orders          Ordered    cephALEXin  (KEFLEX ) 500 MG capsule  2 times daily        01/20/24 2002             Note:  This document was prepared using Dragon voice recognition software and may include unintentional dictation errors.   Viviann Pastor, MD 01/20/24 2004

## 2024-01-21 ENCOUNTER — Inpatient Hospital Stay: Admission: RE | Admit: 2024-01-21 | Source: Ambulatory Visit

## 2024-01-21 ENCOUNTER — Encounter: Payer: Self-pay | Admitting: Oncology

## 2024-01-21 ENCOUNTER — Other Ambulatory Visit

## 2024-01-23 ENCOUNTER — Encounter: Payer: Self-pay | Admitting: Oncology

## 2024-01-25 ENCOUNTER — Encounter: Payer: Self-pay | Admitting: Oncology

## 2024-01-29 ENCOUNTER — Other Ambulatory Visit: Payer: Self-pay

## 2024-01-29 ENCOUNTER — Inpatient Hospital Stay
Admission: EM | Admit: 2024-01-29 | Discharge: 2024-02-03 | DRG: 291 | Disposition: A | Discharge status: Home / Self Care | Attending: Internal Medicine | Admitting: Internal Medicine

## 2024-01-29 ENCOUNTER — Emergency Department

## 2024-01-29 ENCOUNTER — Encounter: Payer: Self-pay | Admitting: Oncology

## 2024-01-29 DIAGNOSIS — Z833 Family history of diabetes mellitus: Secondary | ICD-10-CM | POA: Diagnosis not present

## 2024-01-29 DIAGNOSIS — Z888 Allergy status to other drugs, medicaments and biological substances status: Secondary | ICD-10-CM | POA: Diagnosis not present

## 2024-01-29 DIAGNOSIS — R0600 Dyspnea, unspecified: Principal | ICD-10-CM

## 2024-01-29 DIAGNOSIS — E1151 Type 2 diabetes mellitus with diabetic peripheral angiopathy without gangrene: Secondary | ICD-10-CM | POA: Diagnosis present

## 2024-01-29 DIAGNOSIS — I110 Hypertensive heart disease with heart failure: Secondary | ICD-10-CM | POA: Diagnosis not present

## 2024-01-29 DIAGNOSIS — F32A Depression, unspecified: Secondary | ICD-10-CM | POA: Diagnosis present

## 2024-01-29 DIAGNOSIS — I252 Old myocardial infarction: Secondary | ICD-10-CM

## 2024-01-29 DIAGNOSIS — Z85118 Personal history of other malignant neoplasm of bronchus and lung: Secondary | ICD-10-CM

## 2024-01-29 DIAGNOSIS — E785 Hyperlipidemia, unspecified: Secondary | ICD-10-CM | POA: Diagnosis not present

## 2024-01-29 DIAGNOSIS — Z89511 Acquired absence of right leg below knee: Secondary | ICD-10-CM | POA: Diagnosis not present

## 2024-01-29 DIAGNOSIS — N179 Acute kidney failure, unspecified: Secondary | ICD-10-CM | POA: Diagnosis not present

## 2024-01-29 DIAGNOSIS — I2510 Atherosclerotic heart disease of native coronary artery without angina pectoris: Secondary | ICD-10-CM | POA: Diagnosis present

## 2024-01-29 DIAGNOSIS — I5023 Acute on chronic systolic (congestive) heart failure: Secondary | ICD-10-CM | POA: Diagnosis not present

## 2024-01-29 DIAGNOSIS — Z23 Encounter for immunization: Secondary | ICD-10-CM | POA: Diagnosis not present

## 2024-01-29 DIAGNOSIS — Z91148 Patient's other noncompliance with medication regimen for other reason: Secondary | ICD-10-CM

## 2024-01-29 DIAGNOSIS — Z794 Long term (current) use of insulin: Secondary | ICD-10-CM | POA: Diagnosis not present

## 2024-01-29 DIAGNOSIS — D649 Anemia, unspecified: Secondary | ICD-10-CM | POA: Diagnosis not present

## 2024-01-29 DIAGNOSIS — N184 Chronic kidney disease, stage 4 (severe): Secondary | ICD-10-CM

## 2024-01-29 DIAGNOSIS — Z86711 Personal history of pulmonary embolism: Secondary | ICD-10-CM

## 2024-01-29 DIAGNOSIS — J9601 Acute respiratory failure with hypoxia: Secondary | ICD-10-CM | POA: Diagnosis present

## 2024-01-29 DIAGNOSIS — Z86718 Personal history of other venous thrombosis and embolism: Secondary | ICD-10-CM

## 2024-01-29 DIAGNOSIS — I132 Hypertensive heart and chronic kidney disease with heart failure and with stage 5 chronic kidney disease, or end stage renal disease: Secondary | ICD-10-CM | POA: Diagnosis not present

## 2024-01-29 DIAGNOSIS — I5033 Acute on chronic diastolic (congestive) heart failure: Secondary | ICD-10-CM | POA: Diagnosis present

## 2024-01-29 DIAGNOSIS — I5043 Acute on chronic combined systolic (congestive) and diastolic (congestive) heart failure: Secondary | ICD-10-CM | POA: Diagnosis not present

## 2024-01-29 DIAGNOSIS — E8770 Fluid overload, unspecified: Secondary | ICD-10-CM | POA: Diagnosis present

## 2024-01-29 DIAGNOSIS — Z87891 Personal history of nicotine dependence: Secondary | ICD-10-CM | POA: Diagnosis not present

## 2024-01-29 DIAGNOSIS — Z8249 Family history of ischemic heart disease and other diseases of the circulatory system: Secondary | ICD-10-CM | POA: Diagnosis not present

## 2024-01-29 DIAGNOSIS — J90 Pleural effusion, not elsewhere classified: Secondary | ICD-10-CM | POA: Diagnosis not present

## 2024-01-29 DIAGNOSIS — D631 Anemia in chronic kidney disease: Secondary | ICD-10-CM | POA: Diagnosis not present

## 2024-01-29 DIAGNOSIS — R609 Edema, unspecified: Secondary | ICD-10-CM | POA: Diagnosis not present

## 2024-01-29 DIAGNOSIS — J9811 Atelectasis: Secondary | ICD-10-CM | POA: Diagnosis not present

## 2024-01-29 DIAGNOSIS — I10 Essential (primary) hypertension: Secondary | ICD-10-CM | POA: Diagnosis not present

## 2024-01-29 DIAGNOSIS — Z79899 Other long term (current) drug therapy: Secondary | ICD-10-CM | POA: Diagnosis not present

## 2024-01-29 DIAGNOSIS — E1122 Type 2 diabetes mellitus with diabetic chronic kidney disease: Secondary | ICD-10-CM | POA: Diagnosis present

## 2024-01-29 DIAGNOSIS — L299 Pruritus, unspecified: Secondary | ICD-10-CM | POA: Diagnosis present

## 2024-01-29 DIAGNOSIS — Z7982 Long term (current) use of aspirin: Secondary | ICD-10-CM | POA: Diagnosis not present

## 2024-01-29 DIAGNOSIS — I5082 Biventricular heart failure: Secondary | ICD-10-CM | POA: Diagnosis not present

## 2024-01-29 DIAGNOSIS — N2581 Secondary hyperparathyroidism of renal origin: Secondary | ICD-10-CM | POA: Diagnosis present

## 2024-01-29 DIAGNOSIS — I25118 Atherosclerotic heart disease of native coronary artery with other forms of angina pectoris: Secondary | ICD-10-CM

## 2024-01-29 DIAGNOSIS — I2489 Other forms of acute ischemic heart disease: Secondary | ICD-10-CM | POA: Diagnosis not present

## 2024-01-29 DIAGNOSIS — I429 Cardiomyopathy, unspecified: Secondary | ICD-10-CM | POA: Diagnosis present

## 2024-01-29 DIAGNOSIS — N185 Chronic kidney disease, stage 5: Secondary | ICD-10-CM | POA: Diagnosis present

## 2024-01-29 DIAGNOSIS — Z5982 Transportation insecurity: Secondary | ICD-10-CM

## 2024-01-29 DIAGNOSIS — I509 Heart failure, unspecified: Secondary | ICD-10-CM

## 2024-01-29 DIAGNOSIS — R0602 Shortness of breath: Secondary | ICD-10-CM | POA: Diagnosis not present

## 2024-01-29 DIAGNOSIS — N281 Cyst of kidney, acquired: Secondary | ICD-10-CM | POA: Diagnosis not present

## 2024-01-29 DIAGNOSIS — Z902 Acquired absence of lung [part of]: Secondary | ICD-10-CM

## 2024-01-29 DIAGNOSIS — Z8639 Personal history of other endocrine, nutritional and metabolic disease: Secondary | ICD-10-CM

## 2024-01-29 DIAGNOSIS — I5032 Chronic diastolic (congestive) heart failure: Secondary | ICD-10-CM | POA: Diagnosis not present

## 2024-01-29 DIAGNOSIS — I2582 Chronic total occlusion of coronary artery: Secondary | ICD-10-CM | POA: Diagnosis present

## 2024-01-29 LAB — HEPATIC FUNCTION PANEL
ALT: 19 U/L (ref 0–44)
AST: 39 U/L (ref 15–41)
Albumin: 2.7 g/dL — ABNORMAL LOW (ref 3.5–5.0)
Alkaline Phosphatase: 106 U/L (ref 38–126)
Bilirubin, Direct: <0.1 mg/dL (ref 0.0–0.2)
Total Bilirubin: 0.6 mg/dL (ref 0.0–1.2)
Total Protein: 5.6 g/dL — ABNORMAL LOW (ref 6.5–8.1)

## 2024-01-29 LAB — BASIC METABOLIC PANEL WITH GFR
Anion gap: 14 (ref 5–15)
BUN: 58 mg/dL — ABNORMAL HIGH (ref 8–23)
CO2: 24 mmol/L (ref 22–32)
Calcium: 7.7 mg/dL — ABNORMAL LOW (ref 8.9–10.3)
Chloride: 103 mmol/L (ref 98–111)
Creatinine, Ser: 3.65 mg/dL — ABNORMAL HIGH (ref 0.44–1.00)
GFR, Estimated: 13 mL/min — ABNORMAL LOW (ref >60)
Glucose, Bld: 196 mg/dL — ABNORMAL HIGH (ref 70–99)
Potassium: 3.3 mmol/L — ABNORMAL LOW (ref 3.5–5.1)
Sodium: 141 mmol/L (ref 135–145)

## 2024-01-29 LAB — URINALYSIS, ROUTINE W REFLEX MICROSCOPIC
Bilirubin Urine: NEGATIVE
Glucose, UA: 50 mg/dL — AB
Hgb urine dipstick: NEGATIVE
Ketones, ur: NEGATIVE mg/dL
Leukocytes,Ua: NEGATIVE
Nitrite: NEGATIVE
Protein, ur: >=300 mg/dL — AB
Specific Gravity, Urine: 1.014 (ref 1.005–1.030)
pH: 8 (ref 5.0–8.0)

## 2024-01-29 LAB — TROPONIN I (HIGH SENSITIVITY)
Troponin I (High Sensitivity): 39 ng/L — ABNORMAL HIGH (ref <18)
Troponin I (High Sensitivity): 40 ng/L — ABNORMAL HIGH (ref <18)

## 2024-01-29 LAB — CBC
HCT: 24.6 % — ABNORMAL LOW (ref 36.0–46.0)
Hemoglobin: 7.4 g/dL — ABNORMAL LOW (ref 12.0–15.0)
MCH: 29.8 pg (ref 26.0–34.0)
MCHC: 30.1 g/dL (ref 30.0–36.0)
MCV: 99.2 fL (ref 80.0–100.0)
Platelets: 252 K/uL (ref 150–400)
RBC: 2.48 MIL/uL — ABNORMAL LOW (ref 3.87–5.11)
RDW: 14.5 % (ref 11.5–15.5)
WBC: 5.3 K/uL (ref 4.0–10.5)
nRBC: 0 % (ref 0.0–0.2)

## 2024-01-29 LAB — CBG MONITORING, ED: Glucose-Capillary: 124 mg/dL — ABNORMAL HIGH (ref 70–99)

## 2024-01-29 LAB — LIPASE, BLOOD: Lipase: 49 U/L (ref 11–51)

## 2024-01-29 LAB — BRAIN NATRIURETIC PEPTIDE: B Natriuretic Peptide: 3838.7 pg/mL — ABNORMAL HIGH (ref 0.0–100.0)

## 2024-01-29 MED ORDER — ONDANSETRON HCL 4 MG PO TABS: 4.0000 mg | ORAL_TABLET | Freq: Four times a day (QID) | ORAL | Status: DC | PRN

## 2024-01-29 MED ORDER — INSULIN ASPART 100 UNIT/ML IJ SOLN
0.0000 [IU] | Freq: Three times a day (TID) | INTRAMUSCULAR | Status: DC
  Administered 2024-01-30: 1 [IU] via SUBCUTANEOUS
  Administered 2024-01-30 (×2): 2 [IU] via SUBCUTANEOUS
  Administered 2024-01-30: 1 [IU] via SUBCUTANEOUS
  Administered 2024-01-31 (×2): 4 [IU] via SUBCUTANEOUS
  Administered 2024-02-01: 2 [IU] via SUBCUTANEOUS
  Administered 2024-02-01 – 2024-02-02 (×2): 3 [IU] via SUBCUTANEOUS
  Administered 2024-02-02 (×2): 1 [IU] via SUBCUTANEOUS
  Administered 2024-02-03: 3 [IU] via SUBCUTANEOUS
  Administered 2024-02-03: 1 [IU] via SUBCUTANEOUS
  Filled 2024-01-29 (×9): qty 1
  Filled 2024-01-29: qty 2
  Filled 2024-01-29: qty 1

## 2024-01-29 MED ORDER — ALBUTEROL SULFATE (2.5 MG/3ML) 0.083% IN NEBU: 2.5000 mg | INHALATION_SOLUTION | RESPIRATORY_TRACT | Status: DC | PRN

## 2024-01-29 MED ORDER — POTASSIUM CHLORIDE 10 MEQ/100ML IV SOLN
10.0000 meq | INTRAVENOUS | Status: AC
  Administered 2024-01-30 (×4): 10 meq via INTRAVENOUS
  Filled 2024-01-29 (×2): qty 100

## 2024-01-29 MED ORDER — ACETAMINOPHEN 650 MG RE SUPP: 650.0000 mg | Freq: Four times a day (QID) | RECTAL | Status: DC | PRN

## 2024-01-29 MED ORDER — FUROSEMIDE 10 MG/ML IJ SOLN
60.0000 mg | Freq: Once | INTRAMUSCULAR | Status: AC
  Administered 2024-01-29 (×2): 60 mg via INTRAVENOUS
  Filled 2024-01-29: qty 8

## 2024-01-29 MED ORDER — HEPARIN SODIUM (PORCINE) 5000 UNIT/ML IJ SOLN
5000.0000 [IU] | Freq: Three times a day (TID) | INTRAMUSCULAR | Status: DC
  Administered 2024-01-30 – 2024-02-03 (×16): 5000 [IU] via SUBCUTANEOUS
  Filled 2024-01-29 (×13): qty 1

## 2024-01-29 MED ORDER — FUROSEMIDE 10 MG/ML IJ SOLN
60.0000 mg | Freq: Two times a day (BID) | INTRAMUSCULAR | Status: DC
  Administered 2024-01-30 (×2): 60 mg via INTRAVENOUS
  Filled 2024-01-29: qty 8

## 2024-01-29 MED ORDER — ONDANSETRON HCL 4 MG/2ML IJ SOLN: 4.0000 mg | Freq: Four times a day (QID) | INTRAMUSCULAR | Status: DC | PRN

## 2024-01-29 MED ORDER — MAGNESIUM SULFATE 2 GM/50ML IV SOLN
2.0000 g | Freq: Once | INTRAVENOUS | Status: AC
  Administered 2024-01-30 (×2): 2 g via INTRAVENOUS
  Filled 2024-01-29: qty 50

## 2024-01-29 MED ORDER — HYDRALAZINE HCL 20 MG/ML IJ SOLN: 10.0000 mg | INTRAMUSCULAR | Status: DC | PRN

## 2024-01-29 MED ORDER — ACETAMINOPHEN 325 MG PO TABS
650.0000 mg | ORAL_TABLET | Freq: Four times a day (QID) | ORAL | Status: DC | PRN
  Filled 2024-01-29: qty 2

## 2024-01-29 NOTE — ED Triage Notes (Signed)
 First nurse note: Pt to ED via [facility] from home. Pt reports SOB x2wks especially w/ exertion. Non-productive cough x1wk. Lobectomy in 2023. Pt also reports abd pain x6 months. Pt placed on 2L Cylinder for comfort  CBG 257 98% RA 130/86 HR 102

## 2024-01-29 NOTE — ED Notes (Signed)
 Pt provided w/ sandwich and water per request.

## 2024-01-29 NOTE — ED Provider Notes (Signed)
 [facility] Provider Note    Event Date/Time   First MD Initiated Contact with Patient 01/29/24 2110     (approximate)  History   Chief Complaint: Shortness of Breath  HPI  [name] [name] is a 68 y.o. 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  According to the patient for the past 2 weeks or so she has had progressively worsening shortness of breath.  Patient states occasional cough but no sputum production.  No chest pain.  Patient satting in the mid 90s on room air and placed on 2 L for comfort.  Patient states shortness of breath is worse when lying down or with exertion.  Physical Exam   Triage Vital Signs: ED Triage Vitals  Encounter Vitals Group     BP 01/29/24 1413 (!) 164/78     Girls Systolic BP Percentile --      Girls Diastolic BP Percentile --      Boys Systolic BP Percentile --      Boys Diastolic BP Percentile --      Pulse Rate 01/29/24 1413 74     Resp 01/29/24 1413 18     Temp 01/29/24 1413 98.2 F (36.8 C)     Temp Source 01/29/24 1413 Oral     SpO2 01/29/24 1413 99 %     Weight 01/29/24 1421 111 lb (50.3 kg)     Height 01/29/24 1421 5' 5 (1.651 m)     Head Circumference --      Peak Flow --      Pain Score 01/29/24 1420 0     Pain Loc --      Pain Education --      Exclude from Growth Chart --     Most recent vital signs: Vitals:   01/29/24 2044 01/29/24 2116  BP: (!) 176/97 (!) 183/84  Pulse: 77 80  Resp: 18 18  Temp: 98 F (36.7 C) 98.4 F (36.9 C)  SpO2: 98% 99%    General: Awake, no distress.  CV:  Good peripheral perfusion.  Regular rate and rhythm  Resp:  Normal effort.  Equal breath sounds bilaterally.  Abd:  No distention.  Soft, nontender.  ED Results / Procedures / Treatments   EKG  EKG viewed and interpreted by myself shows a normal  sinus rhythm at 76 bpm with a narrow QRS, left axis deviation, largely normal intervals, no concerning ST changes.  RADIOLOGY  I have reviewed interpret the chest x-ray images.  Patient appears to have pleural effusions as well as fluid within her fissure.   MEDICATIONS ORDERED IN ED: Medications  furosemide  (LASIX ) injection 60 mg (has no administration in time range)     IMPRESSION / MDM / ASSESSMENT AND PLAN / ED COURSE  I reviewed the triage vital signs and the nursing notes.  Patient's presentation is most consistent with acute presentation with potential threat to life or bodily function.  Patient presents emergency department for worsening shortness of breath over the last 2 weeks states significant worsening with any type of exertion or when lying flat.  Patient currently satting in the mid 90s on room air but somewhat tachypneic, placed on 2 L and states she feels much better on 2 L no longer feels overly short of breath.  Patient's workup today shows pleural effusions appears to have fluid  in her fissure on her chest x-ray, concerning for CHF findings.  Patient's CBC shows anemia that has progressively worsened as well hemoglobin currently 7.4.  Patient's chemistry shows chronic renal insufficiency/chronic kidney disease stage V but largely unchanged from baseline.  Patient's urinalysis shows no concerning finding.  Patient's initial troponin slightly elevated at 39 but suspect this is more due to the chronic kidney disease less out any cardiac abnormality we will repeat a troponin after 2 hours as a precaution.  Patient's BNP did result elevated at 3800 again reflecting likely CHF exacerbation.  Given the patient's worsening shortness of breath chest x-ray findings and elevated BNP will admit to the hospitalist service for IV diuresis.  Patient is agreeable to this plan of care.  Patient has a AV fistula in place but has not yet started dialysis.  FINAL CLINICAL IMPRESSION(S) / ED  DIAGNOSES   Dyspnea Congestive heart failure exacerbation   Note:  This document was prepared using Dragon voice recognition software and may include unintentional dictation errors.   Dorothyann Drivers, MD 01/29/24 2206

## 2024-01-29 NOTE — H&P (Addendum)
 History and Physical    [name] [name] FMW:[id number] DOB: Feb 09, 1956 DOA: 01/29/2024  PCP: Manya Toribio SQUIBB, PA  Patient coming from: home  I have personally briefly reviewed patient's old medical records in [facility]  Chief Complaint: sob   HPI: [name] [name] is a 68 y.o. 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 Patient notes no fever/chills/ n/v/d/ chest pain or  note chronic abdominal pain. Patient also mentions decrease urine output over the last 24 hours.   ED Course:  Afeb, bp 164/78, hr 74, rr 18 sat 99%  Wbc 5.3, hgb 7.4 ( 9.1), plt 252  CE39 NA 141, K 3.3, CL 103, glu 196 Cr 3.65 GFR 13 calcium  7.7 Bnp 3838.7 EKG: NSR,  twave flattening Cxr: IMPRESSION: 1. Bilateral pleural effusions and LEFT basilar atelectasis. 2. No overt pulmonary edema.  UA: negative   CE :40  Tx lasix  60 mg  Review of Systems: As per HPI otherwise 10 point review of systems negative.   Past Medical History:  Diagnosis Date   Adenocarcinoma of left lung (HCC)    a.) stage IIIA (pT1c, pN2, cM0) --> s/p LLL lobectomy + 4 cycles of carbo/taxol  + 1 cycle atezolizumab  (discontinued for grade IV toxicity)   Anemia of chronic renal failure    Cerebral microvascular disease    CKD (chronic kidney disease), stage V (HCC)    Cocaine abuse (HCC)    Coronary artery disease    a.) LHC (NSTEMI) 05/30/2010: 10% pLCxm 20% pRCA-1, 100% pRCA-2 - med mgmt; b.) LHC 02/24/2020: CTA pRCA with good L-R collaterals, 50% mLAD, 80% OM1 - med mgmt   Depression    Dyspnea    Elevated LEFT hemidiaphragm    HFrEF (heart failure with reduced ejection fraction) (HCC) 02/16/2020   a.) Dx'd in setting of acute DVT/PE    Hyperlipidemia    Hypertension    Long-term use of aspirin  therapy    NSTEMI (non-ST elevated myocardial infarction) (HCC) 05/28/2010   a.) troponins were trended: 1.70 --> 3.30 --> 4.90 ng/mL; b.) LHC 05/30/2010: 10% pLCx, 20% pRCA-1, 100% pRCA-2 (chronic) with good collaterals - med mgmt   Peripheral artery disease (HCC)    a.) 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   Systolic ejection murmur    T2DM (type 2 diabetes mellitus) (HCC)    Venous thromboembolism (VTE) 02/15/2020   a.) 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; CTA (+) for multiple RIGHT segmental/subsegment pulmonary emboli    Past Surgical History:  Procedure Laterality Date   BELOW KNEE LEG AMPUTATION Right 01/06/2020   INSERTION OF ARTERIOVENOUS (AV) ARTEGRAFT ARM Left 01/10/2024   Procedure: INSERTION, GRAFT, ARTERIOVENOUS, UPPER EXTREMITY;  Surgeon: Marea Selinda RAMAN, MD;  Location: [facility] ORS;  Service: Vascular;  Laterality: Left;  BRACHIAL AXILLARY   IR IMAGING GUIDED PORT INSERTION  07/27/2021   LEFT HEART CATH AND CORONARY ANGIOGRAPHY Left    Procedure: LEFT HEART CATHETERIZATION AND CORONARY ANGIOGRAPHY; Location: [facility]; Surgeon: Gerhardt Mana and Zachary Prentice Car, MD   LEFT HEART CATH AND CORONARY ANGIOGRAPHY Left 05/30/2010   Procedure: LEFT HEART CATH AND CORONARY ANGIOGRAPHY; Location: [facility]; Surgeon: Vinie Jude, MD   LEFT LOWER PULMONARY LOBECTOMY AND LYMPH NODE DISSECTION  Left 02/06/2021   PORTA CATH INSERTION       reports that she quit smoking about 3 years ago. Her smoking use included cigars and cigarettes. She has never used smokeless tobacco. She reports that she does not currently use drugs. She reports that she does not drink alcohol .  Allergies  Allergen Reactions   Lisinopril Swelling and Other (See Comments)    Recurrent AKI and hyperkalemia when initiation attempted. Other Reaction(s): Facial edema    Family History  Problem Relation Age of Onset   Heart disease Mother    Diabetes Mother    Heart disease  Father    Diabetes Father     Prior to Admission medications   Medication Sig Start Date End Date Taking? Authorizing Provider  aspirin  EC 81 MG tablet Take 81 mg by mouth in the morning. Swallow whole.    [provider]  atorvastatin  (LIPITOR ) 80 MG tablet Take 1 tablet by mouth daily. 11/14/23   [provider]  calcium  acetate (PHOSLO) 667 MG capsule Take 667 mg by mouth. Patient not taking: Reported on 12/06/2023 11/23/23 11/22/24  [provider]  carboxymethylcellulose (REFRESH PLUS) 0.5 % SOLN Place 1 drop into both eyes in the morning and at bedtime.    [provider]  CAREFINE PEN NEEDLES 32G X 4 MM MISC CAREFINE PEN NEEDLES 32G X 4 MM 08/19/18   [provider]  carvedilol  (COREG ) 6.25 MG tablet Take 6.25 mg by mouth. 12/14/23 01/17/25  [provider]  cephALEXin  (KEFLEX ) 500 MG capsule Take 1 capsule (500 mg total) by mouth 2 (two) times daily. 01/20/24   Viviann Pastor, MD  Continuous Glucose Receiver (DEXCOM G7 RECEIVER) DEVI Use as instructed with G7 sensors 08/31/23   [provider]  Continuous Glucose Sensor (DEXCOM G7 SENSOR) MISC Dispense DexCom G7 sensors; Use 1 sensor q 10 days 08/31/23   [provider]  diphenhydrAMINE  (BENADRYL ) 25 MG tablet Take 25 mg by mouth every 6 (six) hours as needed. Patient not taking: Reported on 01/02/2024    [provider]  DULoxetine  (CYMBALTA ) 20 MG capsule TAKE 2 CAPSULES (40 MG TOTAL) BY MOUTH 2 (TWO) TIMES DAILY. 12/25/23   Manya Toribio SQUIBB, PA  ezetimibe (ZETIA) 10 MG tablet Take 10 mg by mouth daily. 12/14/23 12/13/24  [provider]  Glucagon 3 MG/DOSE POWD Use 1 spray in 1 nostril for severe hypoglycemia, as per package instructions 08/31/23   [provider]  HUMALOG  KWIKPEN 100 UNIT/ML KwikPen Inject 8 Units into the skin 3 (three) times daily after meals. 08/25/21   Joshua Cathryne BROCKS, MD  hydrALAZINE  (APRESOLINE ) 100 MG tablet Take 100 mg by mouth 3  (three) times daily.    [provider]  isosorbide  dinitrate (ISORDIL ) 30 MG tablet TAKE 1 TABLET BY MOUTH THREE TIMES A DAY 04/14/23   Joshua Cathryne BROCKS, MD  ketoconazole  (NIZORAL ) 2 % cream APPLY TO FEET TOPICALLY IN THE MORNING AND AT BED DAILY 11/04/23   Jones, Deanna C, MD  LANTUS  SOLOSTAR 100 UNIT/ML Solostar Pen Inject into the skin.    [provider]  NIFEdipine (PROCARDIA-XL/NIFEDICAL-XL) 30 MG 24 hr tablet Take 60 mg by mouth. Patient not taking: Reported on 01/02/2024 10/11/23 11/14/24  [provider]  torsemide (DEMADEX) 20 MG tablet Take 80 mg by mouth daily. Take 4 20 mg tabs = 80 mg    [provider]  traMADol  (ULTRAM ) 50 MG tablet Take 1 tablet (50 mg total) by mouth every  6 (six) hours as needed. 01/10/24 01/09/25  Marea Selinda RAMAN, MD    Physical Exam: Vitals:   01/29/24 1420 01/29/24 1421 01/29/24 2044 01/29/24 2116  BP: (!) 164/78  (!) 176/97 (!) 183/84  Pulse: 77  77 80  Resp: 18  18 18   Temp: 98.2 F (36.8 C)  98 F (36.7 C) 98.4 F (36.9 C)  TempSrc: Oral  Oral Oral  SpO2: 97%  98% 99%  Weight:  50.3 kg    Height:  5' 5 (1.651 m)      Constitutional: NAD, calm, comfortable Vitals:   01/29/24 1420 01/29/24 1421 01/29/24 2044 01/29/24 2116  BP: (!) 164/78  (!) 176/97 (!) 183/84  Pulse: 77  77 80  Resp: 18  18 18   Temp: 98.2 F (36.8 C)  98 F (36.7 C) 98.4 F (36.9 C)  TempSrc: Oral  Oral Oral  SpO2: 97%  98% 99%  Weight:  50.3 kg    Height:  5' 5 (1.651 m)     Eyes: PERRL, lids and conjunctivae normal ENMT: Mucous membranes are moist. Posterior pharynx clear of any exudate or lesions.Normal dentition.  Neck: normal, supple, no masses, no thyromegaly Respiratory: decrease bs mid to lower lung fields  Normal respiratory effort at rest increase with minimal exertion. No accessory muscle use.  Cardiovascular: Regular rate and rhythm, no murmurs / rubs / gallops. +extremity edema., + sacral edema Abdomen: no tenderness, no  masses palpated. No hepatosplenomegaly. Bowel sounds positive.  Musculoskeletal: no clubbing / cyanosis. No joint deformity upper and lower extremities. Good ROM, no contractures. Normal muscle tone.  Right bka Skin: no rashes, lesions, ulcers. No induration Neurologic: CN grossly intact. Sensation intact, Strength 5/5 in all 4.  Psychiatric: Normal judgment and insight. Alert and oriented x 3. Normal mood.    Labs on Admission: I have personally reviewed following labs and imaging studies  CBC: Recent Labs  Lab 01/29/24 1422  WBC 5.3  HGB 7.4*  HCT 24.6*  MCV 99.2  PLT 252   Basic Metabolic Panel: Recent Labs  Lab 01/29/24 1422  NA 141  K 3.3*  CL 103  CO2 24  GLUCOSE 196*  BUN 58*  CREATININE 3.65*  CALCIUM  7.7*   GFR: Estimated Creatinine Clearance: 12.2 mL/min (A) (by C-G formula based on SCr of 3.65 mg/dL (H)). Liver Function Tests: Recent Labs  Lab 01/29/24 1422  AST 39  ALT 19  ALKPHOS 106  BILITOT 0.6  PROT 5.6*  ALBUMIN 2.7*   Recent Labs  Lab 01/29/24 1422  LIPASE 49   No results for input(s): AMMONIA in the last 168 hours. Coagulation Profile: No results for input(s): INR, PROTIME in the last 168 hours. Cardiac Enzymes: No results for input(s): CKTOTAL, CKMB, CKMBINDEX, TROPONINI in the last 168 hours. BNP (last 3 results) No results for input(s): PROBNP in the last 8760 hours. HbA1C: No results for input(s): HGBA1C in the last 72 hours. CBG: Recent Labs  Lab 01/29/24 2030  GLUCAP 124*   Lipid Profile: No results for input(s): CHOL, HDL, LDLCALC, TRIG, CHOLHDL, LDLDIRECT in the last 72 hours. Thyroid  Function Tests: No results for input(s): TSH, T4TOTAL, FREET4, T3FREE, THYROIDAB in the last 72 hours. Anemia Panel: No results for input(s): VITAMINB12, FOLATE, FERRITIN, TIBC, IRON , RETICCTPCT in the last 72 hours. Urine analysis:    Component Value Date/Time   COLORURINE YELLOW (A)  01/29/2024 2045   APPEARANCEUR CLEAR (A) 01/29/2024 2045   APPEARANCEUR Clear 11/18/2020 1403   LABSPEC 1.014  01/29/2024 2045   LABSPEC 1.024 11/11/2012 0848   PHURINE 8.0 01/29/2024 2045   GLUCOSEU 50 (A) 01/29/2024 2045   GLUCOSEU >=500 11/11/2012 0848   HGBUR NEGATIVE 01/29/2024 2045   BILIRUBINUR NEGATIVE 01/29/2024 2045   BILIRUBINUR negative 06/20/2022 1405   BILIRUBINUR Negative 11/18/2020 1403   BILIRUBINUR Negative 11/11/2012 0848   KETONESUR NEGATIVE 01/29/2024 2045   PROTEINUR >=300 (A) 01/29/2024 2045   UROBILINOGEN 0.2 06/20/2022 1405   NITRITE NEGATIVE 01/29/2024 2045   LEUKOCYTESUR NEGATIVE 01/29/2024 2045   LEUKOCYTESUR Negative 11/11/2012 0848    Radiological Exams on Admission: DG Chest 2 View Result Date: 01/29/2024 EXAM: CHEST - 2 VIEW COMPARISON:  06/01/2022 FINDINGS: Port in the anterior chest wall with tip in distal SVC. Stable enlarged cardiac silhouette. There is bilateral pleural effusion. LEFT basilar atelectasis. No pneumothorax. No overt pulmonary edema. No acute osseous abnormality. IMPRESSION: 1. Bilateral pleural effusions and LEFT basilar atelectasis. 2. No overt pulmonary edema. Electronically Signed   By: Jackquline Boxer M.D.   On: 01/29/2024 15:17    EKG: Independently reviewed.   Assessment/Plan  Acute on chronic CHF ref complicated by CKDIV  with fluid overload with acute hypoxic respiratory failure. -ef 25% on echo 12/2023  -admit to progressive care -start on lasix   60 mg bid  - strict I/o , daily weight  -in no improvement consider both renal and cardiology consult  -Due to renal failure not on GDMT   CKDIV -at chronic baseline  -has fistula placed  - plans for HD in near future  -patient noted decrease urine out over the last 24 hours   Anemia -presumed ACD -will check anemia labs   CAD s/p NSTEMI -diffuse disease CTO RCA  with collaterals  -continue medical management -CE flat  -EKG no hyperacute findings   Hx of VTE   -s/p bkA -s/p tx  Hx of Lung CA s/p LLL   HLD -continue statin /zetia  HTN -Not at goal  -would expect improvement with diuresis  -resume home regimen once reconciled -prn hydralazine  for now    DMII -iss/fs  -resume home regimen once med rec completed   Depression  -resume home regimen once med rec completed   DVT prophylaxis: heparin  Code Status: full/ as discussed per patient wishes in event of cardiac arrest  Family Communication: non family at bedside Disposition Plan: full/ as discussed per patient wishes in event of cardiac arrest  Consults called: nephrology /cardiology  Dr Lateef/[facility] Dr Raford epic message sent , please f/u in am Admission status: progressive care    Camila DELENA Ned MD [facility]   If 7PM-7AM, please contact night-coverage [email or web address] Password [email or web address]  01/29/2024, 10:40 PM

## 2024-01-29 NOTE — ED Triage Notes (Signed)
 Pt c/o SOB that has worsened over the past few days. Pt reports having CHF and CKD.

## 2024-01-30 ENCOUNTER — Inpatient Hospital Stay

## 2024-01-30 ENCOUNTER — Encounter: Payer: Self-pay | Admitting: Oncology

## 2024-01-30 ENCOUNTER — Other Ambulatory Visit: Payer: Self-pay

## 2024-01-30 DIAGNOSIS — R0600 Dyspnea, unspecified: Secondary | ICD-10-CM

## 2024-01-30 DIAGNOSIS — I5023 Acute on chronic systolic (congestive) heart failure: Secondary | ICD-10-CM | POA: Diagnosis not present

## 2024-01-30 DIAGNOSIS — I10 Essential (primary) hypertension: Secondary | ICD-10-CM | POA: Diagnosis not present

## 2024-01-30 DIAGNOSIS — R0602 Shortness of breath: Secondary | ICD-10-CM | POA: Diagnosis not present

## 2024-01-30 DIAGNOSIS — I5033 Acute on chronic diastolic (congestive) heart failure: Secondary | ICD-10-CM

## 2024-01-30 LAB — CBC
HCT: 28.1 % — ABNORMAL LOW (ref 36.0–46.0)
Hemoglobin: 8.2 g/dL — ABNORMAL LOW (ref 12.0–15.0)
MCH: 29.6 pg (ref 26.0–34.0)
MCHC: 29.2 g/dL — ABNORMAL LOW (ref 30.0–36.0)
MCV: 101.4 fL — ABNORMAL HIGH (ref 80.0–100.0)
Platelets: 242 K/uL (ref 150–400)
RBC: 2.77 MIL/uL — ABNORMAL LOW (ref 3.87–5.11)
RDW: 14.5 % (ref 11.5–15.5)
WBC: 4.6 K/uL (ref 4.0–10.5)
nRBC: 0 % (ref 0.0–0.2)

## 2024-01-30 LAB — URINE DRUG SCREEN, QUALITATIVE (ARMC ONLY)
Amphetamines, Ur Screen: NOT DETECTED
Barbiturates, Ur Screen: NOT DETECTED
Benzodiazepine, Ur Scrn: NOT DETECTED
Cannabinoid 50 Ng, Ur ~~LOC~~: NOT DETECTED
Cocaine Metabolite,Ur ~~LOC~~: NOT DETECTED
MDMA (Ecstasy)Ur Screen: NOT DETECTED
Methadone Scn, Ur: NOT DETECTED
Opiate, Ur Screen: NOT DETECTED
Phencyclidine (PCP) Ur S: NOT DETECTED
Tricyclic, Ur Screen: NOT DETECTED

## 2024-01-30 LAB — COMPREHENSIVE METABOLIC PANEL WITH GFR
ALT: 17 U/L (ref 0–44)
AST: 28 U/L (ref 15–41)
Albumin: 2.6 g/dL — ABNORMAL LOW (ref 3.5–5.0)
Alkaline Phosphatase: 109 U/L (ref 38–126)
Anion gap: 12 (ref 5–15)
BUN: 57 mg/dL — ABNORMAL HIGH (ref 8–23)
CO2: 26 mmol/L (ref 22–32)
Calcium: 7.4 mg/dL — ABNORMAL LOW (ref 8.9–10.3)
Chloride: 104 mmol/L (ref 98–111)
Creatinine, Ser: 3.52 mg/dL — ABNORMAL HIGH (ref 0.44–1.00)
GFR, Estimated: 14 mL/min — ABNORMAL LOW (ref >60)
Glucose, Bld: 126 mg/dL — ABNORMAL HIGH (ref 70–99)
Potassium: 3.2 mmol/L — ABNORMAL LOW (ref 3.5–5.1)
Sodium: 142 mmol/L (ref 135–145)
Total Bilirubin: 0.6 mg/dL (ref 0.0–1.2)
Total Protein: 5.5 g/dL — ABNORMAL LOW (ref 6.5–8.1)

## 2024-01-30 LAB — TSH: TSH: 2.795 u[IU]/mL (ref 0.350–4.500)

## 2024-01-30 LAB — TROPONIN I (HIGH SENSITIVITY)
Troponin I (High Sensitivity): 35 ng/L — ABNORMAL HIGH (ref <18)
Troponin I (High Sensitivity): 35 ng/L — ABNORMAL HIGH (ref <18)

## 2024-01-30 LAB — GLUCOSE, CAPILLARY: Glucose-Capillary: 222 mg/dL — ABNORMAL HIGH (ref 70–99)

## 2024-01-30 LAB — CBG MONITORING, ED
Glucose-Capillary: 161 mg/dL — ABNORMAL HIGH (ref 70–99)
Glucose-Capillary: 207 mg/dL — ABNORMAL HIGH (ref 70–99)
Glucose-Capillary: 147 mg/dL — ABNORMAL HIGH (ref 70–99)

## 2024-01-30 LAB — HIV ANTIBODY (ROUTINE TESTING W REFLEX): HIV Screen 4th Generation wRfx: NONREACTIVE

## 2024-01-30 MED ORDER — ENSURE PLUS HIGH PROTEIN PO LIQD
237.0000 mL | Freq: Two times a day (BID) | ORAL | Status: DC
  Administered 2024-01-31: 237 mL via ORAL

## 2024-01-30 MED ORDER — PNEUMOCOCCAL 20-VAL CONJ VACC 0.5 ML IM SUSY
0.5000 mL | PREFILLED_SYRINGE | INTRAMUSCULAR | Status: AC
  Administered 2024-01-31: 0.5 mL via INTRAMUSCULAR
  Filled 2024-01-30: qty 0.5

## 2024-01-30 MED ORDER — HYDRALAZINE HCL 50 MG PO TABS
100.0000 mg | ORAL_TABLET | Freq: Three times a day (TID) | ORAL | Status: DC
  Administered 2024-01-30 – 2024-02-03 (×16): 100 mg via ORAL
  Filled 2024-01-30 (×13): qty 2

## 2024-01-30 MED ORDER — CARVEDILOL 6.25 MG PO TABS
6.2500 mg | ORAL_TABLET | Freq: Two times a day (BID) | ORAL | Status: DC
  Administered 2024-01-30 – 2024-01-31 (×5): 6.25 mg via ORAL
  Filled 2024-01-30 (×3): qty 1

## 2024-01-30 MED ORDER — FUROSEMIDE 10 MG/ML IJ SOLN
5.0000 mg/h | INTRAVENOUS | Status: DC
  Administered 2024-01-30 – 2024-01-31 (×3): 5 mg/h via INTRAVENOUS
  Filled 2024-01-30 (×2): qty 20

## 2024-01-30 MED ORDER — ISOSORBIDE DINITRATE 30 MG PO TABS
30.0000 mg | ORAL_TABLET | Freq: Three times a day (TID) | ORAL | Status: DC
  Administered 2024-01-30 – 2024-02-03 (×16): 30 mg via ORAL
  Filled 2024-01-30 (×15): qty 1

## 2024-01-30 MED ORDER — SODIUM CHLORIDE 0.9 % IV SOLN: INTRAVENOUS | Status: AC | PRN

## 2024-01-30 NOTE — Progress Notes (Signed)
 Progress Note   Patient: [name] [name] FMW:[id number] DOB: 10/06/1955 DOA: 01/29/2024     1 DOS: the patient was seen and examined on 01/30/2024   Brief hospital course: KEYIA MORETTO is a 68 y.o. 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 Patient notes no fever/chills/ n/v/d/ chest pain or  note chronic abdominal pain. Patient also mentions decrease urine output over the last 24 hours.   Assessment and Plan:    Acute on chronic CHF ref complicated by CKDIV  with fluid overload with acute hypoxic respiratory failure. -ef 25% on echo 12/2023  -admit to progressive care Continue IV Lasix  drip continue to monitor strict I/o , daily weight  Nephrologist on board we appreciate input -Due to renal failure not on GDMT     CKDIV -at chronic baseline  -has fistula placed  - plans for HD in near future  -patient noted decrease urine out over the last 24 hours    Anemia -presumed ACD -will check anemia labs    CAD s/p NSTEMI -diffuse disease CTO RCA  with collaterals  -continue medical management -CE flat  -EKG no hyperacute findings    Hx of VTE  -s/p bkA -s/p tx   Hx of Lung CA s/p LLL    HLD -continue statin /zetia   HTN Continue current antihypertensives -prn hydralazine  for now      DMII -iss/fs  -resume home regimen once med rec completed    Depression  -resume home regimen once med rec completed     DVT prophylaxis: heparin  Code Status: full/ as discussed per patient wishes in event of cardiac arrest   Family Communication: non family at bedside     Subjective:   Patient seen and examined at bedside this morning Admits to improvement in respiratory function Shortness of  breath better Denies chest pain nausea vomiting abdominal pain  Physical Exam:  [name] female laying in bed in no acute respiratory distress Neck: normal, supple, no masses, no thyromegaly Respiratory: Fine crackles noted at the lung bases bilaterally Cardiovascular: Regular rate and rhythm, no murmurs / rubs / gallops. +extremity edema., + sacral edema Abdomen: no tenderness, no masses palpated. No hepatosplenomegaly. Bowel sounds positive.  Musculoskeletal: no clubbing / cyanosis. No joint deformity upper and lower extremities. Good ROM, no contractures. Normal muscle tone.  Right bka Skin: no rashes, lesions, ulcers. No induration Neurologic: CN grossly intact. Sensation intact, Strength 5/5 in all 4.  Psychiatric: Normal judgment and insight. Alert and oriented x 3. Normal mood.     Vitals:   01/30/24 1630 01/30/24 1645 01/30/24 1659 01/30/24 1701  BP: (!) 151/97  (!) 151/97 (!) 156/71  Pulse: 78 81 80 82  Resp:    17  Temp:    98.2 F (36.8 C)  TempSrc:    Oral  SpO2: 99% 97%  98%  Weight:      Height:        Data Reviewed: I have personally reviewed patient chest x-ray that shows findings of bilateral pleural effusion    Latest Ref Rng & Units 01/29/2024   11:57 PM 01/29/2024    2:22 PM 01/02/2024   10:10 AM  CBC  WBC  4.0 - 10.5 K/uL 4.6  5.3  5.9   Hemoglobin 12.0 - 15.0 g/dL 8.2  7.4  9.1   Hematocrit 36.0 - 46.0 % 28.1  24.6  29.0   Platelets 150 - 400 K/uL 242  252  295        Latest Ref Rng & Units 01/29/2024   11:57 PM 01/29/2024    2:22 PM 01/02/2024   10:10 AM  BMP  Glucose 70 - 99 mg/dL 873  803  806   BUN 8 - 23 mg/dL 57  58  76   Creatinine 0.44 - 1.00 mg/dL 6.47  6.34  5.96   Sodium 135 - 145 mmol/L 142  141  141   Potassium 3.5 - 5.1 mmol/L 3.2  3.3  3.4   Chloride 98 - 111 mmol/L 104  103  110   CO2 22 - 32 mmol/L 26  24  19    Calcium  8.9 - 10.3 mg/dL 7.4  7.7  8.0       Disposition: Status is: Inpatient  Time spent: 56  minutes  Author: Drue ONEIDA Potter, MD 01/30/2024 6:05 PM  For on call review [email or web address].

## 2024-01-30 NOTE — Consult Note (Signed)
 Cardiology Consultation   Patient ID: [name] [name] MRN: [id number]; DOB: 1955/08/04  Admit date: 01/29/2024 Date of Consult: 01/30/2024  PCP:  Manya Toribio SQUIBB, PA   [facility] Providers Cardiologist:  None        Patient Profile: [name] [name] is a 68 y.o. 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 Debby.    History of Present Illness: Ms. Biddy follows with [facility] cardiology.  She was admitted to [facility] 01/2020 with acute PE.  Echo at that time showed severely reduced LV systolic function with EF 20 to 25% and RV systolic dysfunction.  Cardiology was consulted and patient underwent LHC which showed occluded proximal RCA with left-to-right collaterals, 50% mid LAD stenosis, and 80% OM1 stenosis.  No intervention was done. She has been hospitalized multiple times for heart failure and volume overload, most recently 09/2023 due to discontinuation of diuretics.  Most recent echo 12/2023 showed EF 50 to 55%, G1 DD, and mild MR.  Patient reports 5-6 days of worsening dyspnea on exertion. She reports associated fatigue, orthopnea, and abdominal fullness. She also noted left sided abdominal pain. She feels like her dose of torsemide  has not been sufficient to keep the fluid off and she can feel it in her lungs. She decided to present to the ED for further evaluation. In the ED, BP 164/78 with otherwise normal vital signs.  Pertinent labs include K3.3, BUN 58, creatinine 3.65, calcium  7.7, albumin 2.7, total protein 5.6, Hgb 7.4, HCT 24.6.  BNP 3838.  EKG shows sinus rhythm without acute ST/T wave abnormalities.  Chest x-ray with bilateral pleural effusions and left basilar atelectasis.   Troponin minimally elevated and flat trending.  At time of cardiology consult, patient reports significant improvement in dyspnea and abdominal fullness. She reports taking her medications as prescribed at home. She denies chest pain, lightheadedness, dizziness, and lower extremity swelling.   Past Medical History:  Diagnosis Date   Adenocarcinoma of left lung (HCC)    a.) stage IIIA (pT1c, pN2, cM0) --> s/p LLL lobectomy + 4 cycles of carbo/taxol  + 1 cycle atezolizumab  (discontinued for grade IV toxicity)   Anemia of chronic renal failure    Cerebral microvascular disease    CKD (chronic kidney disease), stage V (HCC)    Cocaine abuse (HCC)    Coronary artery disease    a.) LHC (NSTEMI) 05/30/2010: 10% pLCxm 20% pRCA-1, 100% pRCA-2 - med mgmt; b.) LHC 02/24/2020: CTA pRCA with good L-R collaterals, 50% mLAD, 80% OM1 - med mgmt   Depression    Dyspnea    Elevated LEFT hemidiaphragm    HFrEF (heart failure with reduced ejection fraction) (HCC) 02/16/2020   a.) Dx'd in setting of acute DVT/PE   Hyperlipidemia    Hypertension    Long-term use of aspirin  therapy    NSTEMI (non-ST elevated myocardial infarction) (HCC) 05/28/2010   a.) troponins were trended: 1.70 --> 3.30 --> 4.90 ng/mL; b.) LHC 05/30/2010: 10% pLCx, 20% pRCA-1, 100% pRCA-2 (chronic) with good collaterals - med mgmt   Peripheral artery disease (HCC)    a.) 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   Systolic ejection murmur    T2DM (type 2 diabetes  mellitus) (HCC)    Venous thromboembolism (VTE) 02/15/2020   a.) 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; CTA (+) for multiple RIGHT segmental/subsegment pulmonary emboli    Past Surgical History:  Procedure Laterality Date   BELOW KNEE LEG AMPUTATION Right 01/06/2020   INSERTION OF ARTERIOVENOUS (AV) ARTEGRAFT ARM Left 01/10/2024   Procedure: INSERTION, GRAFT, ARTERIOVENOUS, UPPER EXTREMITY;  Surgeon: Marea Selinda RAMAN, MD;  Location: [facility] ORS;  Service: Vascular;  Laterality: Left;  BRACHIAL AXILLARY    IR IMAGING GUIDED PORT INSERTION  07/27/2021   LEFT HEART CATH AND CORONARY ANGIOGRAPHY Left    Procedure: LEFT HEART CATHETERIZATION AND CORONARY ANGIOGRAPHY; Location: [facility]; Surgeon: Gerhardt Mana and Zachary Prentice Car, MD   LEFT HEART CATH AND CORONARY ANGIOGRAPHY Left 05/30/2010   Procedure: LEFT HEART CATH AND CORONARY ANGIOGRAPHY; Location: [facility]; Surgeon: Vinie Jude, MD   LEFT LOWER PULMONARY LOBECTOMY AND LYMPH NODE DISSECTION Left 02/06/2021   PORTA CATH INSERTION         Scheduled Meds:  furosemide   60 mg Intravenous BID   heparin   5,000 Units Subcutaneous Q8H   insulin  aspart  0-6 Units Subcutaneous TID WC   Continuous Infusions:  sodium chloride  Stopped (01/30/24 0548)   PRN Meds: sodium chloride , acetaminophen  **OR** acetaminophen , albuterol , hydrALAZINE , ondansetron  **OR** ondansetron  (ZOFRAN ) IV  Allergies:    Allergies  Allergen Reactions   Lisinopril Swelling and Other (See Comments)    Recurrent AKI and hyperkalemia when initiation attempted. Other Reaction(s): Facial edema    Social History:   Social History   Socioeconomic History   Marital status: Married    Spouse name: Not on file   Number of children: Not on file   Years of education: Not on file   Highest education level: Not on file  Occupational History   Not on file  Tobacco Use   Smoking status: Former    Current packs/day: 0.00    Types: Cigars, Cigarettes    Quit date: 01/17/2021    Years since quitting: 3.0   Smokeless tobacco: Never  Vaping Use   Vaping status: Never Used  Substance and Sexual Activity   Alcohol  use: Never   Drug use: Not Currently   Sexual activity: Not Currently  Other Topics Concern   Not on file  Social History Narrative   Not on file   Social Drivers of Health   Financial Resource Strain: Low Risk  (06/29/2023)   Received from [facility]   Overall Financial Resource Strain (CARDIA)    Difficulty of Paying Living Expenses: Not  hard at all  Food Insecurity: No Food Insecurity (07/16/2023)   Hunger Vital Sign    Worried About Running Out of Food in the Last Year: Never true    Ran Out of Food in the Last Year: Never true  Transportation Needs: Unmet Transportation Needs (07/16/2023)   PRAPARE - Transportation    Lack of Transportation (Medical): Yes    Lack of Transportation (Non-Medical): Yes  Physical Activity: Not on file  Stress: Stress Concern Present (12/15/2021)   [employer] of Occupational Health - Occupational Stress Questionnaire    Feeling of Stress : To some extent  Social Connections: Moderately Integrated (07/16/2023)   Social Connection and Isolation Panel    Frequency of Communication with Friends and Family: More than three times a week    Frequency of Social Gatherings with Friends and Family: More than three times a week    Attends Religious Services: 1 to 4 times per year  Active Member of Clubs or Organizations: Yes    Attends Banker Meetings: 1 to 4 times per year    Marital Status: Widowed  Recent Concern: Social Connections - Moderately Isolated (06/26/2023)   Social Connection and Isolation Panel    Frequency of Communication with Friends and Family: More than three times a week    Frequency of Social Gatherings with Friends and Family: More than three times a week    Attends Religious Services: Never    Database administrator or Organizations: Yes    Attends Banker Meetings: 1 to 4 times per year    Marital Status: Widowed  Intimate Partner Violence: Not At Risk (07/16/2023)   Humiliation, Afraid, Rape, and Kick questionnaire    Fear of Current or Ex-Partner: No    Emotionally Abused: No    Physically Abused: No    Sexually Abused: No    Family History:    Family History  Problem Relation Age of Onset   Heart disease Mother    Diabetes Mother    Heart disease Father    Diabetes Father      ROS:  Please see the history of present illness.     Physical Exam/Data: Vitals:   01/30/24 0430 01/30/24 0500 01/30/24 0530 01/30/24 0630  BP: (!) 189/91  (!) 177/76   Pulse: 80 76 75   Resp:   (!) 28   Temp:    98.7 F (37.1 C)  TempSrc:    Oral  SpO2: 93% 98% 97%   Weight:      Height:        Intake/Output Summary (Last 24 hours) at 01/30/2024 0912 Last data filed at 01/30/2024 0121 Gross per 24 hour  Intake 150 ml  Output --  Net 150 ml      01/29/2024    2:21 PM 01/20/2024    2:49 PM 01/10/2024    6:38 AM  Last 3 Weights  Weight (lbs) 111 lb 107 lb 12.9 oz 107 lb 12.9 oz  Weight (kg) 50.349 kg 48.9 kg 48.9 kg     Body mass index is 18.47 kg/m.  General:  Well nourished, well developed, in no acute distress HEENT: normal Neck: no JVD Vascular: No carotid bruits; Distal pulses 2+ bilaterally Cardiac:  normal S1, S2; RRR; no murmur  Lungs: diminished at the bases with bibasilar crackles Abd: soft, nontender, no hepatomegaly  Ext: Trace LE edema Skin: warm and dry  Psych:  Normal affect   EKG:  The EKG was personally reviewed and demonstrates:  sinus rhythm with anterior Q waves, rate 76 bpm  Relevant CV Studies:  12/31/2023 Echo complete   1. The left ventricle is normal in size with mildly increased wall  thickness.   2. The left ventricular systolic function is borderline, LVEF is visually  estimated at 50-55%.    3. There is grade II diastolic dysfunction (elevated filling pressure).    4. The right ventricle is normal in size, with normal systolic function.    5. There is mild mitral valve regurgitation.   Laboratory Data: High Sensitivity Troponin:   Recent Labs  Lab 01/29/24 1422 01/29/24 2045 01/29/24 2345 01/30/24 0300  TROPONINIHS 39* 40* 35* 35*     Chemistry Recent Labs  Lab 01/29/24 1422 01/29/24 2357  NA 141 142  K 3.3* 3.2*  CL 103 104  CO2 24 26  GLUCOSE 196* 126*  BUN 58* 57*  CREATININE 3.65* 3.52*  CALCIUM  7.7*  7.4*  GFRNONAA 13* 14*  ANIONGAP 14 12    Recent Labs  Lab  01/29/24 1422 01/29/24 2357  PROT 5.6* 5.5*  ALBUMIN 2.7* 2.6*  AST 39 28  ALT 19 17  ALKPHOS 106 109  BILITOT 0.6 0.6   Lipids No results for input(s): CHOL, TRIG, HDL, LABVLDL, LDLCALC, CHOLHDL in the last 168 hours.  Hematology Recent Labs  Lab 01/29/24 1422 01/29/24 2357  WBC 5.3 4.6  RBC 2.48* 2.77*  HGB 7.4* 8.2*  HCT 24.6* 28.1*  MCV 99.2 101.4*  MCH 29.8 29.6  MCHC 30.1 29.2*  RDW 14.5 14.5  PLT 252 242   Thyroid   Recent Labs  Lab 01/29/24 2357  TSH 2.795    BNP Recent Labs  Lab 01/29/24 1422  BNP 3,838.7*    DDimer No results for input(s): DDIMER in the last 168 hours.  Radiology/Studies:  DG Chest 2 View Result Date: 01/29/2024 IMPRESSION: 1. Bilateral pleural effusions and LEFT basilar atelectasis. 2. No overt pulmonary edema. Electronically Signed   By: Jackquline Boxer M.D.   On: 01/29/2024 15:17   Assessment and Plan:  Acute on chronic CHF HFimpEF - Prior echo 01/2020 with EF 20 to 25% in the setting of acute PE.  Systolic function recovered with most recent echo 12/2023 showing EF 50 to 55% with G2 DD. - BNP greater than 3000 - Chest x-ray with bilateral pleural effusions - Patient received IV Lasix  60 mg x 2 with poorly recorded I/os - Volume status appears to be improving - Ongoing diuresis with Lasix  infusion per nephrology - Continue to monitor kidney function, strict I/Os, and daily weights with ongoing diuresis - Resume PTA carvedilol  6.25 twice daily - Unable to escalate GDMT further at this time due to renal dysfunction  CKD - Creatinine 3.65 on admission, now 3.52.  Baseline appears to be ~3.4. - Renal ultrasound ordered - Lasix  infusion and ongoing management per nephrology  Anemia - Hemoglobin 7.4 on admission - Low threshold for transfusion - Likely contributing to symptoms  Hypertension - Blood pressure elevated on admission - Resume PTA carvedilol  6.25 mg twice daily, Isordil  30 mg 3 times daily, and  hydralazine  100 mg 3 times daily  For questions or updates, please contact [facility] Please consult [email or web address] for contact info under    Signed, Lesley LITTIE Maffucci, PA-C  01/30/2024 9:12 AM

## 2024-01-30 NOTE — Group Note (Deleted)
 Date:  01/30/2024 Time:  2:22 PM  Group Topic/Focus:  Wellness Toolbox:   The focus of this group is to discuss various aspects of wellness, balancing those aspects and exploring ways to increase the ability to experience wellness.  Patients will create a wellness toolbox for use upon discharge.     Participation Level:  {BHH PARTICIPATION OZCZO:77735}  Participation Quality:  {BHH PARTICIPATION QUALITY:22265}  Affect:  {BHH AFFECT:22266}  Cognitive:  {BHH COGNITIVE:22267}  Insight: {BHH Insight2:20797}  Engagement in Group:  {BHH ENGAGEMENT IN HMNLE:77731}  Modes of Intervention:  {BHH MODES OF INTERVENTION:22269}  Additional Comments:  ***  Myra Curtistine BROCKS 01/30/2024, 2:22 PM

## 2024-01-30 NOTE — Consult Note (Signed)
 [facility]  CONSULT NOTE    Date: 01/30/2024                  Patient Name:  [name] [name]  MRN: [id number]  DOB: Feb 12, 1956  Age / Sex: 68 y.o., female         PCP: Manya Toribio SQUIBB, PA                 Service Requesting Consult: [facility]                 Reason for Consult: Acute kidney injury on chronic kidney disease stage 4             History of Present Illness: [name] is a 68 y.o.  female with medical history to include CAD, diabetes, cocaine abuse, depression. HTN, lung ca with LLL lobectomy and chronic kidney disease stage 4, who was admitted to [facility] on 01/29/2024 for CHF (congestive heart failure) (HCC) [I50.9]  Patient present to hospital for lowe extremity edema and shortness of breath. States it began a few days ago. Reports taking all medications as prescribed. No known fever or chills. Denies chest pain. Seen laying on stretcher, room air. States she has shortness of breath with activity.   Labs on ED arrival showed potassium 3.3, BUN 58, creatinine 3.6 GFR 13, BNP greater than 3800, troponin 39, hemoglobin 7.4.  UA appears clear with proteinuria.  Chest x-ray shows bilateral pleural effusion with left basilar atelectasis.  No evidence of pulmonary edema.  Urine tox screen negative.  Medications: Outpatient medications: (Not in a hospital admission)   Current medications: Current Facility-Administered Medications  Medication Dose Route Frequency Provider Last Rate Last Admin   0.9 %  sodium chloride  infusion   Intravenous PRN Debby Camila LABOR, MD   Stopped at 01/30/24 0548   acetaminophen  (TYLENOL ) tablet 650 mg  650 mg Oral Q6H PRN Debby Camila LABOR, MD       Or   acetaminophen  (TYLENOL ) suppository 650 mg  650 mg Rectal Q6H PRN Debby Camila LABOR, MD       albuterol  (PROVENTIL ) (2.5 MG/3ML) 0.083% nebulizer solution 2.5 mg  2.5 mg Nebulization Q2H PRN Debby Camila LABOR, MD       carvedilol  (COREG ) tablet 6.25 mg  6.25 mg  Oral BID WC Gollan, Timothy J, MD   6.25 mg at 01/30/24 1012   furosemide  (LASIX ) 200 mg in dextrose  5 % 100 mL (2 mg/mL) infusion  5 mg/hr Intravenous Continuous Jac Romulus, NP 2.5 mL/hr at 01/30/24 1018 5 mg/hr at 01/30/24 1018   heparin  injection 5,000 Units  5,000 Units Subcutaneous Q8H Debby Camila A, MD   5,000 Units at 01/30/24 9472   hydrALAZINE  (APRESOLINE ) injection 10 mg  10 mg Intravenous Q4H PRN Debby Camila LABOR, MD       hydrALAZINE  (APRESOLINE ) tablet 100 mg  100 mg Oral TID Gollan, Timothy J, MD   100 mg at 01/30/24 1012   insulin  aspart (novoLOG ) injection 0-6 Units  0-6 Units Subcutaneous TID WC Debby Camila LABOR, MD   2 Units at 01/30/24 1239   isosorbide  dinitrate (ISORDIL ) tablet 30 mg  30 mg Oral TID Gollan, Timothy J, MD   30 mg at 01/30/24 1012   ondansetron  (ZOFRAN ) tablet 4 mg  4 mg Oral Q6H PRN Debby Camila LABOR, MD       Or   ondansetron  (ZOFRAN ) injection 4 mg  4 mg Intravenous Q6H PRN Debby Camila  A, MD       Current Outpatient Medications  Medication Sig Dispense Refill   aspirin  EC 81 MG tablet Take 81 mg by mouth in the morning. Swallow whole.     atorvastatin  (LIPITOR ) 80 MG tablet Take 1 tablet by mouth daily.     carboxymethylcellulose (REFRESH PLUS) 0.5 % SOLN Place 1 drop into both eyes in the morning and at bedtime.     carvedilol  (COREG ) 6.25 MG tablet Take 6.25 mg by mouth 2 (two) times daily with a meal.     DULoxetine  (CYMBALTA ) 20 MG capsule TAKE 2 CAPSULES (40 MG TOTAL) BY MOUTH 2 (TWO) TIMES DAILY. 360 capsule 0   Glucagon 3 MG/DOSE POWD Use 1 spray in 1 nostril for severe hypoglycemia, as per package instructions     HUMALOG  KWIKPEN 100 UNIT/ML KwikPen Inject 8 Units into the skin 3 (three) times daily after meals. (Patient taking differently: Inject 4 Units into the skin 3 (three) times daily after meals. PER SLIDING SCALE) 15 mL 11   hydrALAZINE  (APRESOLINE ) 100 MG tablet Take 100 mg by mouth 3 (three) times daily.      isosorbide  dinitrate (ISORDIL ) 30 MG tablet TAKE 1 TABLET BY MOUTH THREE TIMES A DAY 270 tablet 1   ketoconazole  (NIZORAL ) 2 % cream APPLY TO FEET TOPICALLY IN THE MORNING AND AT BED DAILY 60 g 1   LANTUS  SOLOSTAR 100 UNIT/ML Solostar Pen Inject 4 Units into the skin at bedtime.     torsemide  (DEMADEX ) 20 MG tablet Take 80 mg by mouth daily. Take 4 20 mg tabs = 80 mg     traMADol  (ULTRAM ) 50 MG tablet Take 1 tablet (50 mg total) by mouth every 6 (six) hours as needed. 20 tablet 0   calcium  acetate (PHOSLO) 667 MG capsule Take 667 mg by mouth. (Patient not taking: Reported on 12/06/2023)     CAREFINE PEN NEEDLES 32G X 4 MM MISC CAREFINE PEN NEEDLES 32G X 4 MM     cephALEXin  (KEFLEX ) 500 MG capsule Take 1 capsule (500 mg total) by mouth 2 (two) times daily. (Patient not taking: Reported on 01/29/2024) 14 capsule 0   Continuous Glucose Receiver (DEXCOM G7 RECEIVER) DEVI Use as instructed with G7 sensors     Continuous Glucose Sensor (DEXCOM G7 SENSOR) MISC Dispense DexCom G7 sensors; Use 1 sensor q 10 days     diphenhydrAMINE  (BENADRYL ) 25 MG tablet Take 25 mg by mouth every 6 (six) hours as needed. (Patient not taking: Reported on 01/02/2024)     ezetimibe (ZETIA) 10 MG tablet Take 10 mg by mouth daily. (Patient not taking: Reported on 01/29/2024)     NIFEdipine (PROCARDIA-XL/NIFEDICAL-XL) 30 MG 24 hr tablet Take 60 mg by mouth. (Patient not taking: Reported on 01/02/2024)     Facility-Administered Medications Ordered in Other Encounters  Medication Dose Route Frequency Provider Last Rate Last Admin   heparin  lock flush 100 UNIT/ML injection            heparin  lock flush 100 unit/mL  500 Units Intravenous Once Yu, Zhou, MD          Allergies: Allergies  Allergen Reactions   Lisinopril Swelling and Other (See Comments)    Recurrent AKI and hyperkalemia when initiation attempted. Other Reaction(s): Facial edema      Past Medical History: Past Medical History:  Diagnosis Date   Adenocarcinoma  of left lung (HCC)    a.) stage IIIA (pT1c, pN2, cM0) --> s/p LLL lobectomy + 4 cycles  of carbo/taxol  + 1 cycle atezolizumab  (discontinued for grade IV toxicity)   Anemia of chronic renal failure    Cerebral microvascular disease    CKD (chronic kidney disease), stage V (HCC)    Cocaine abuse (HCC)    Coronary artery disease    a.) LHC (NSTEMI) 05/30/2010: 10% pLCxm 20% pRCA-1, 100% pRCA-2 - med mgmt; b.) LHC 02/24/2020: CTA pRCA with good L-R collaterals, 50% mLAD, 80% OM1 - med mgmt   Depression    Dyspnea    Elevated LEFT hemidiaphragm    HFrEF (heart failure with reduced ejection fraction) (HCC) 02/16/2020   a.) Dx'd in setting of acute DVT/PE   Hyperlipidemia    Hypertension    Long-term use of aspirin  therapy    NSTEMI (non-ST elevated myocardial infarction) (HCC) 05/28/2010   a.) troponins were trended: 1.70 --> 3.30 --> 4.90 ng/mL; b.) LHC 05/30/2010: 10% pLCx, 20% pRCA-1, 100% pRCA-2 (chronic) with good collaterals - med mgmt   Peripheral artery disease (HCC)    a.) 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   Systolic ejection murmur    T2DM (type 2 diabetes mellitus) (HCC)    Venous thromboembolism (VTE) 02/15/2020   a.) 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; CTA (+) for multiple RIGHT segmental/subsegment pulmonary emboli     Past Surgical History: Past Surgical History:  Procedure Laterality Date   BELOW KNEE LEG AMPUTATION Right 01/06/2020   INSERTION OF ARTERIOVENOUS (AV) ARTEGRAFT ARM Left 01/10/2024   Procedure: INSERTION, GRAFT, ARTERIOVENOUS, UPPER EXTREMITY;  Surgeon: Marea Selinda RAMAN, MD;  Location: [facility] ORS;  Service: Vascular;  Laterality: Left;  BRACHIAL AXILLARY   IR IMAGING GUIDED PORT INSERTION  07/27/2021   LEFT HEART CATH AND CORONARY ANGIOGRAPHY Left    Procedure: LEFT HEART CATHETERIZATION AND CORONARY ANGIOGRAPHY; Location: [facility]; Surgeon: Gerhardt Mana and Zachary Prentice Car, MD   LEFT HEART CATH AND CORONARY ANGIOGRAPHY Left 05/30/2010   Procedure: LEFT HEART CATH AND  CORONARY ANGIOGRAPHY; Location: [facility]; Surgeon: Vinie Jude, MD   LEFT LOWER PULMONARY LOBECTOMY AND LYMPH NODE DISSECTION Left 02/06/2021   PORTA CATH INSERTION       Family History: Family History  Problem Relation Age of Onset   Heart disease Mother    Diabetes Mother    Heart disease Father    Diabetes Father      Social History: Social History   Socioeconomic History   Marital status: Married    Spouse name: Not on file   Number of children: Not on file   Years of education: Not on file   Highest education level: Not on file  Occupational History   Not on file  Tobacco Use   Smoking status: Former    Current packs/day: 0.00    Types: Cigars, Cigarettes    Quit date: 01/17/2021    Years since quitting: 3.0   Smokeless tobacco: Never  Vaping Use   Vaping status: Never Used  Substance and Sexual Activity   Alcohol  use: Never   Drug use: Not Currently   Sexual activity: Not Currently  Other Topics Concern   Not on file  Social History Narrative   Not on file   Social Drivers of Health   Financial Resource Strain: Low Risk  (06/29/2023)   [facility] from [facility]   Overall Financial Resource Strain (CARDIA)    Difficulty of Paying Living Expenses: Not hard at all  Food Insecurity: No Food Insecurity (07/16/2023)   Hunger Vital Sign    Worried About Running Out of  Food in the Last Year: Never true    Ran Out of Food in the Last Year: Never true  Transportation Needs: Unmet Transportation Needs (07/16/2023)   PRAPARE - Transportation    Lack of Transportation (Medical): Yes    Lack of Transportation (Non-Medical): Yes  Physical Activity: Not on file  Stress: Stress Concern Present (12/15/2021)   [employer] of Occupational Health - Occupational Stress Questionnaire    Feeling of Stress : To some extent  Social Connections: Moderately Integrated (07/16/2023)   Social Connection and Isolation Panel    Frequency of Communication with  Friends and Family: More than three times a week    Frequency of Social Gatherings with Friends and Family: More than three times a week    Attends Religious Services: 1 to 4 times per year    Active Member of [employer] or Organizations: Yes    Attends Banker Meetings: 1 to 4 times per year    Marital Status: Widowed  Recent Concern: Social Connections - Moderately Isolated (06/26/2023)   Social Connection and Isolation Panel    Frequency of Communication with Friends and Family: More than three times a week    Frequency of Social Gatherings with Friends and Family: More than three times a week    Attends Religious Services: Never    Database administrator or Organizations: Yes    Attends Banker Meetings: 1 to 4 times per year    Marital Status: Widowed  Intimate Partner Violence: Not At Risk (07/16/2023)   Humiliation, Afraid, Rape, and Kick questionnaire    Fear of Current or Ex-Partner: No    Emotionally Abused: No    Physically Abused: No    Sexually Abused: No     Review of Systems: Review of Systems  Constitutional:  Negative for chills, fever and malaise/fatigue.  HENT:  Negative for congestion, sore throat and tinnitus.   Eyes:  Negative for blurred vision and redness.  Respiratory:  Positive for shortness of breath. Negative for cough and wheezing.   Cardiovascular:  Positive for leg swelling. Negative for chest pain, palpitations and claudication.  Gastrointestinal:  Negative for abdominal pain, blood in stool, diarrhea, nausea and vomiting.  Genitourinary:  Negative for flank pain, frequency and hematuria.  Musculoskeletal:  Negative for back pain, falls and myalgias.  Skin:  Negative for rash.  Neurological:  Negative for dizziness, weakness and headaches.  Endo/Heme/Allergies:  Does not bruise/bleed easily.  Psychiatric/Behavioral:  Negative for depression. The patient is not nervous/anxious and does not have insomnia.     Vital Signs: Blood  pressure (!) 158/75, pulse 78, temperature 98.5 F (36.9 C), temperature source Oral, resp. rate 18, height 5' 5 (1.651 m), weight 50.3 kg, SpO2 96%.  Weight trends: Filed Weights   01/29/24 1421  Weight: 50.3 kg    Physical Exam: General: NAD  Head: Normocephalic, atraumatic. Moist oral mucosal membranes  Eyes: Anicteric  Neck: Supple  Lungs:  Wheeze throughout   Heart: Regular rate and rhythm  Abdomen:  Soft, nontender,   Extremities:  trace peripheral edema. Rt BKA  Neurologic: Alert and oriented  Skin: No lesions  Access: Lt upper AVG (maturing)     Lab results: Basic Metabolic Panel: Recent Labs  Lab 01/29/24 1422 01/29/24 2357  NA 141 142  K 3.3* 3.2*  CL 103 104  CO2 24 26  GLUCOSE 196* 126*  BUN 58* 57*  CREATININE 3.65* 3.52*  CALCIUM  7.7* 7.4*    Liver  Function Tests: Recent Labs  Lab 01/29/24 1422 01/29/24 2357  AST 39 28  ALT 19 17  ALKPHOS 106 109  BILITOT 0.6 0.6  PROT 5.6* 5.5*  ALBUMIN 2.7* 2.6*   Recent Labs  Lab 01/29/24 1422  LIPASE 49   No results for input(s): AMMONIA in the last 168 hours.  CBC: Recent Labs  Lab 01/29/24 1422 01/29/24 2357  WBC 5.3 4.6  HGB 7.4* 8.2*  HCT 24.6* 28.1*  MCV 99.2 101.4*  PLT 252 242    Cardiac Enzymes: No results for input(s): CKTOTAL, CKMB, CKMBINDEX, TROPONINI in the last 168 hours.  BNP: Invalid input(s): POCBNP  CBG: Recent Labs  Lab 01/29/24 2030 01/30/24 0724 01/30/24 1108  GLUCAP 124* 161* 207*    Microbiology: Results for orders placed or performed during the hospital encounter of 01/02/24  Surgical pcr screen     Status: None   Collection Time: 01/02/24 10:08 AM   Specimen: Nasal Mucosa; Nasal Swab  Result Value Ref Range Status   MRSA, PCR NEGATIVE NEGATIVE Final   Staphylococcus aureus NEGATIVE NEGATIVE Final    Comment: (NOTE) The Xpert SA Assay (FDA approved for NASAL specimens in patients 67 years of age and older), is one component of a  comprehensive surveillance program. It is not intended to diagnose infection nor to guide or monitor treatment. Performed at [facility], [address] [place], [place] [address]     Coagulation Studies: No results for input(s): LABPROT, INR in the last 72 hours.  Urinalysis: Recent Labs    01/29/24 2045  COLORURINE YELLOW*  LABSPEC 1.014  PHURINE 8.0  GLUCOSEU 50*  HGBUR NEGATIVE  BILIRUBINUR NEGATIVE  KETONESUR NEGATIVE  PROTEINUR >=300*  NITRITE NEGATIVE  LEUKOCYTESUR NEGATIVE      Imaging: DG Chest 2 View Result Date: 01/29/2024 EXAM: CHEST - 2 VIEW COMPARISON:  06/01/2022 FINDINGS: Port in the anterior chest wall with tip in distal SVC. Stable enlarged cardiac silhouette. There is bilateral pleural effusion. LEFT basilar atelectasis. No pneumothorax. No overt pulmonary edema. No acute osseous abnormality. IMPRESSION: 1. Bilateral pleural effusions and LEFT basilar atelectasis. 2. No overt pulmonary edema. Electronically Signed   By: Jackquline Boxer M.D.   On: 01/29/2024 15:17     Assessment & Plan: [name] is a 68 y.o.  female with medical history to include CAD, diabetes, cocaine abuse, depression. HTN, lung ca with LLL lobectomy and chronic kidney disease stage 4, who was admitted to [facility] on 01/29/2024 for CHF (congestive heart failure) (HCC) [I50.9]   Acute kidney injury on chronic kidney disease stage 4. Baseline creatinine appears to be 3.41 with GFR 14 on 12/17/23. AKI secondary to volume overload vs progression of kidney disease. Will order renal ultrasound to evaluate for obstruction. Patient had left AVG placed on 7/24 by Dr Marea. No acute indication for dialysis. Will monitor renal function during diuretic therapy. Discussed with patient that we will attempt fluid removal with Furosemide , but that may worsen renal function. Will monitor closely.   2. Chronic diastolic heart failure, ECHO from July 2025 show EF 50-55% with a grade II DD.  Mild MVR. Will transition patient to IV furosemide  drip 5mg /hr.   3. Anemia of chronic kidney disease Lab Results  Component Value Date   HGB 8.2 (L) 01/29/2024    Hgb decreased, patient with history of lung cancer. Will avoid ESA.   4. Hypertension with chronic kidney disease. Home regimen includes carvedilol , hydralazine , isosorbide , nifedipine, and torsemide.  LOS: 1 Zakk Borgen 8/13/20251:29 PM

## 2024-01-30 NOTE — ED Notes (Signed)
 Patient denies pain and is resting comfortably.

## 2024-01-30 NOTE — TOC CM/SW Note (Signed)
..  Transition of Care [facility]) - Inpatient Brief Assessment   Patient Details  Name: [name] [name] MRN: [id number] Date of Birth: 03-20-1956  Transition of Care [facility]) CM/SW Contact:    Edsel DELENA Fischer, LCSW Phone Number: 01/30/2024, 9:55 AM   Clinical Narrative:  SW to handoff to heart failure team to follow up with pt.  If additional needs are present, sw to address  Transition of Care Asessment:

## 2024-01-30 NOTE — ED Notes (Signed)
 CCMD called to initiate monitoring @ this time

## 2024-01-31 DIAGNOSIS — I5033 Acute on chronic diastolic (congestive) heart failure: Secondary | ICD-10-CM | POA: Diagnosis not present

## 2024-01-31 DIAGNOSIS — N179 Acute kidney failure, unspecified: Secondary | ICD-10-CM | POA: Diagnosis not present

## 2024-01-31 DIAGNOSIS — N184 Chronic kidney disease, stage 4 (severe): Secondary | ICD-10-CM | POA: Diagnosis not present

## 2024-01-31 DIAGNOSIS — I5023 Acute on chronic systolic (congestive) heart failure: Secondary | ICD-10-CM | POA: Diagnosis not present

## 2024-01-31 DIAGNOSIS — I10 Essential (primary) hypertension: Secondary | ICD-10-CM | POA: Diagnosis not present

## 2024-01-31 DIAGNOSIS — D649 Anemia, unspecified: Secondary | ICD-10-CM

## 2024-01-31 LAB — CBC WITH DIFFERENTIAL/PLATELET
Abs Immature Granulocytes: 0.01 K/uL (ref 0.00–0.07)
Basophils Absolute: 0 K/uL (ref 0.0–0.1)
Basophils Relative: 1 %
Eosinophils Absolute: 0.2 K/uL (ref 0.0–0.5)
Eosinophils Relative: 5 %
HCT: 24.7 % — ABNORMAL LOW (ref 36.0–46.0)
Hemoglobin: 7.5 g/dL — ABNORMAL LOW (ref 12.0–15.0)
Immature Granulocytes: 0 %
Lymphocytes Relative: 19 %
Lymphs Abs: 0.8 K/uL (ref 0.7–4.0)
MCH: 29.3 pg (ref 26.0–34.0)
MCHC: 30.4 g/dL (ref 30.0–36.0)
MCV: 96.5 fL (ref 80.0–100.0)
Monocytes Absolute: 0.5 K/uL (ref 0.1–1.0)
Monocytes Relative: 11 %
Neutro Abs: 2.9 K/uL (ref 1.7–7.7)
Neutrophils Relative %: 64 %
Platelets: 255 K/uL (ref 150–400)
RBC: 2.56 MIL/uL — ABNORMAL LOW (ref 3.87–5.11)
RDW: 14.4 % (ref 11.5–15.5)
WBC: 4.4 K/uL (ref 4.0–10.5)
nRBC: 0 % (ref 0.0–0.2)

## 2024-01-31 LAB — BASIC METABOLIC PANEL WITH GFR
Anion gap: 12 (ref 5–15)
BUN: 59 mg/dL — ABNORMAL HIGH (ref 8–23)
CO2: 26 mmol/L (ref 22–32)
Calcium: 7.9 mg/dL — ABNORMAL LOW (ref 8.9–10.3)
Chloride: 100 mmol/L (ref 98–111)
Creatinine, Ser: 3.6 mg/dL — ABNORMAL HIGH (ref 0.44–1.00)
GFR, Estimated: 13 mL/min — ABNORMAL LOW (ref >60)
Glucose, Bld: 186 mg/dL — ABNORMAL HIGH (ref 70–99)
Potassium: 3.3 mmol/L — ABNORMAL LOW (ref 3.5–5.1)
Sodium: 138 mmol/L (ref 135–145)

## 2024-01-31 LAB — GLUCOSE, CAPILLARY
Glucose-Capillary: 148 mg/dL — ABNORMAL HIGH (ref 70–99)
Glucose-Capillary: 333 mg/dL — ABNORMAL HIGH (ref 70–99)
Glucose-Capillary: 305 mg/dL — ABNORMAL HIGH (ref 70–99)
Glucose-Capillary: 264 mg/dL — ABNORMAL HIGH (ref 70–99)
Glucose-Capillary: 228 mg/dL — ABNORMAL HIGH (ref 70–99)

## 2024-01-31 LAB — HEPATITIS B SURFACE ANTIGEN: Hepatitis B Surface Ag: NONREACTIVE

## 2024-01-31 LAB — C DIFFICILE QUICK SCREEN W PCR REFLEX
C Diff antigen: NEGATIVE
C Diff interpretation: NOT DETECTED
C Diff toxin: NEGATIVE

## 2024-01-31 MED ORDER — POTASSIUM CHLORIDE 10 MEQ/100ML IV SOLN
10.0000 meq | INTRAVENOUS | Status: AC
  Administered 2024-01-31 (×3): 10 meq via INTRAVENOUS
  Filled 2024-01-31 (×3): qty 100

## 2024-01-31 MED ORDER — NEPRO/CARBSTEADY PO LIQD
237.0000 mL | Freq: Three times a day (TID) | ORAL | Status: DC
  Administered 2024-01-31 – 2024-02-03 (×8): 237 mL via ORAL

## 2024-01-31 MED ORDER — CARVEDILOL 12.5 MG PO TABS
12.5000 mg | ORAL_TABLET | Freq: Two times a day (BID) | ORAL | Status: DC
  Administered 2024-01-31 – 2024-02-02 (×4): 12.5 mg via ORAL
  Filled 2024-01-31 (×4): qty 1

## 2024-01-31 MED ORDER — CARVEDILOL 6.25 MG PO TABS
6.2500 mg | ORAL_TABLET | Freq: Once | ORAL | Status: AC
  Administered 2024-01-31: 6.25 mg via ORAL
  Filled 2024-01-31: qty 1

## 2024-01-31 NOTE — Progress Notes (Signed)
 [facility]  ROUNDING NOTE   Subjective:   Patient sitting up in bed Alert and oriented Room air, denies shortness of breath No lower extremity edema  Creatinine 3.6  Objective:  Vital signs in last 24 hours:  Temp:  [97.4 F (36.3 C)-98.5 F (36.9 C)] 97.7 F (36.5 C) (08/14 0810) Pulse Rate:  [67-82] 73 (08/14 0810) Resp:  [16-22] 16 (08/14 0810) BP: (127-168)/(62-97) 167/70 (08/14 0810) SpO2:  [93 %-100 %] 100 % (08/14 0810) Weight:  [56.9 kg] 56.9 kg (08/14 0422)  Weight change: 6.551 kg Filed Weights   01/29/24 1421 01/31/24 0422  Weight: 50.3 kg 56.9 kg    Intake/Output: I/O last 3 completed shifts: In: 224 [I.V.:74; IV Piggyback:150] Out: 200 [Urine:200]   Intake/Output this shift:  Total I/O In: 480 [P.O.:480] Out: -   Physical Exam: General: NAD  Head: Normocephalic, atraumatic. Moist oral mucosal membranes  Eyes: Anicteric  Neck: Supple  Lungs:  Clear to auscultation  Heart: Regular rate and rhythm  Abdomen:  Soft, nontender  Extremities:  No peripheral edema.  Neurologic: Awake, alert, conversant  Skin: Warm,dry, no rash       Basic Metabolic Panel: Recent Labs  Lab 01/29/24 1422 01/29/24 2357 01/31/24 0605  NA 141 142 138  K 3.3* 3.2* 3.3*  CL 103 104 100  CO2 24 26 26   GLUCOSE 196* 126* 186*  BUN 58* 57* 59*  CREATININE 3.65* 3.52* 3.60*  CALCIUM  7.7* 7.4* 7.9*    Liver Function Tests: Recent Labs  Lab 01/29/24 1422 01/29/24 2357  AST 39 28  ALT 19 17  ALKPHOS 106 109  BILITOT 0.6 0.6  PROT 5.6* 5.5*  ALBUMIN 2.7* 2.6*   Recent Labs  Lab 01/29/24 1422  LIPASE 49   No results for input(s): AMMONIA in the last 168 hours.  CBC: Recent Labs  Lab 01/29/24 1422 01/29/24 2357 01/31/24 0605  WBC 5.3 4.6 4.4  NEUTROABS  --   --  2.9  HGB 7.4* 8.2* 7.5*  HCT 24.6* 28.1* 24.7*  MCV 99.2 101.4* 96.5  PLT 252 242 255    Cardiac Enzymes: No results for input(s): CKTOTAL, CKMB, CKMBINDEX,  TROPONINI in the last 168 hours.  BNP: Invalid input(s): POCBNP  CBG: Recent Labs  Lab 01/30/24 0724 01/30/24 1108 01/30/24 1651 01/30/24 2336 01/31/24 0808  GLUCAP 161* 207* 147* 222* 148*    Microbiology: Results for orders placed or performed during the hospital encounter of 01/02/24  Surgical pcr screen     Status: None   Collection Time: 01/02/24 10:08 AM   Specimen: Nasal Mucosa; Nasal Swab  Result Value Ref Range Status   MRSA, PCR NEGATIVE NEGATIVE Final   Staphylococcus aureus NEGATIVE NEGATIVE Final    Comment: (NOTE) The Xpert SA Assay (FDA approved for NASAL specimens in patients 58 years of age and older), is one component of a comprehensive surveillance program. It is not intended to diagnose infection nor to guide or monitor treatment. Performed at [facility], [address] [place], [place] [address]     Coagulation Studies: No results for input(s): LABPROT, INR in the last 72 hours.  Urinalysis: Recent Labs    01/29/24 2045  COLORURINE YELLOW*  LABSPEC 1.014  PHURINE 8.0  GLUCOSEU 50*  HGBUR NEGATIVE  BILIRUBINUR NEGATIVE  KETONESUR NEGATIVE  PROTEINUR >=300*  NITRITE NEGATIVE  LEUKOCYTESUR NEGATIVE      Imaging: US  RENAL Result Date: 01/30/2024 CLINICAL DATA:  Acute kidney failure. EXAM: RENAL / URINARY TRACT  ULTRASOUND COMPLETE COMPARISON:  August 24, 2021 FINDINGS: Right Kidney: Renal measurements: 9.5 cm x 3.8 cm x 5.0 cm = volume: 95.5 mL. Diffusely increased echogenicity of the renal parenchyma is noted. No mass or hydronephrosis visualized. Left Kidney: Renal measurements: 8.3 cm x 4.6 cm x 4.4 cm = volume: 87.6 mL. Diffusely increased echogenicity of the renal parenchyma is noted. A 2.3 cm x 2.1 cm x 2.4 cm simple cyst is seen within the lower pole of the left kidney. No hydronephrosis is visualized. Bladder: Appears normal for degree of bladder distention. Other: None. IMPRESSION: 1. Bilateral echogenic kidneys which  may represent sequelae associated with medical renal disease. 2. Simple left renal cysts. Electronically Signed   By: Suzen Dials M.D.   On: 01/30/2024 15:45   DG Chest 2 View Result Date: 01/29/2024 EXAM: CHEST - 2 VIEW COMPARISON:  06/01/2022 FINDINGS: Port in the anterior chest wall with tip in distal SVC. Stable enlarged cardiac silhouette. There is bilateral pleural effusion. LEFT basilar atelectasis. No pneumothorax. No overt pulmonary edema. No acute osseous abnormality. IMPRESSION: 1. Bilateral pleural effusions and LEFT basilar atelectasis. 2. No overt pulmonary edema. Electronically Signed   By: Jackquline Boxer M.D.   On: 01/29/2024 15:17     Medications:    furosemide  (LASIX ) 200 mg in dextrose  5 % 100 mL (2 mg/mL) infusion 5 mg/hr (01/30/24 2101)   potassium chloride  10 mEq (01/31/24 1038)    carvedilol   12.5 mg Oral BID WC   feeding supplement  237 mL Oral BID BM   heparin   5,000 Units Subcutaneous Q8H   hydrALAZINE   100 mg Oral TID   insulin  aspart  0-6 Units Subcutaneous TID WC   isosorbide  dinitrate  30 mg Oral TID   acetaminophen  **OR** acetaminophen , albuterol , hydrALAZINE , ondansetron  **OR** ondansetron  (ZOFRAN ) IV  Assessment/ Plan:  [name] is a 68 y.o.  female  with medical history to include CAD, diabetes, cocaine abuse, depression. HTN, lung ca with LLL lobectomy and chronic kidney disease stage 4, who was admitted to [facility] on 01/29/2024 for CHF (congestive heart failure) (HCC) [I50.9] Dyspnea, unspecified type [R06.00] Acute on chronic congestive heart failure, unspecified heart failure type (HCC) [I50.9]   Acute kidney injury on chronic kidney disease stage 4. Baseline creatinine appears to be 3.41 with GFR 14 on 12/17/23. AKI secondary to volume overload vs progression of kidney disease. Will order renal ultrasound to evaluate for obstruction. Patient had left AVG placed on 7/24 by Dr Marea.  Renal function remains decreased but stable. Responding  well to diuretic therapy. Will continue to monitor  Lab Results  Component Value Date   CREATININE 3.60 (H) 01/31/2024   CREATININE 3.52 (H) 01/29/2024   CREATININE 3.65 (H) 01/29/2024    Intake/Output Summary (Last 24 hours) at 01/31/2024 1057 Last data filed at 01/31/2024 0900 Gross per 24 hour  Intake 554.03 ml  Output 200 ml  Net 354.03 ml   2. Chronic diastolic heart failure, ECHO from July 2025 show EF 50-55% with a grade II DD. Mild MVR. Continue IV furosemide  drip 5mg /hr. Lower extremity edema improved.   3. Anemia of chronic kidney disease Lab Results  Component Value Date   HGB 7.5 (L) 01/31/2024    Hgb decreased. Will order iron  studies. Will avoid ESA due to lung cancer history.   4. Hypertension with chronic kidney disease. Home regimen includes carvedilol , hydralazine , isosorbide , nifedipine, and torsemide . Blood pressure elevated today. Continue current regimen   LOS: 2 Kahmari Herard  8/14/202510:57 AM

## 2024-01-31 NOTE — Progress Notes (Signed)
 Progress Note   Patient: [name] [name] FMW:[id number] DOB: May 25, 1956 DOA: 01/29/2024     2 DOS: the patient was seen and examined on 01/31/2024    Brief hospital course: [name] [name] is a 68 y.o. 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 Patient notes no fever/chills/ n/v/d/ chest pain or  note chronic abdominal pain. Patient also mentions decrease urine output over the last 24 hours.    Assessment and Plan:      Acute on chronic CHF ref complicated by CKDIV  with fluid overload with acute hypoxic respiratory failure. 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  -has fistula placed  - plans for HD in near future     Anemia -presumed ACD -will check anemia labs    CAD s/p NSTEMI -diffuse disease CTO RCA  with collaterals  -continue medical management -CE flat  -EKG no hyperacute findings    Hx of VTE  -s/p bkA -s/p tx   Hx of Lung CA s/p LLL    HLD -continue statin /zetia   HTN Continue current antihypertensives -prn hydralazine  for now      DMII -iss/fs  -resume home regimen once med rec completed    Depression  -resume home regimen once med rec completed     DVT prophylaxis: heparin  Code Status: full/ as discussed per patient wishes in event of cardiac arrest   Family Communication: non family at bedside       Subjective:    Patient seen and examined at bedside this morning Respiratory function is improved Continues to be  on Lasix  drip   Physical Exam:   [name] female laying in bed in no acute respiratory distress Neck: normal, supple, no masses, no thyromegaly Respiratory: Fine crackles noted at the lung bases bilaterally Cardiovascular: Regular rate and rhythm, no murmurs / rubs / gallops. +extremity edema., + sacral edema Abdomen: no tenderness, no masses palpated. No hepatosplenomegaly. Bowel sounds positive.  Musculoskeletal: no clubbing / cyanosis. No joint deformity upper and lower extremities. Good ROM, no contractures. Normal muscle tone.  Right bka Skin: no rashes, lesions, ulcers. No induration Neurologic: CN grossly intact. Sensation intact, Strength 5/5 in all 4.  Psychiatric: Normal judgment and insight. Alert and oriented x 3. Normal mood.      Data Reviewed:   Vitals:   01/31/24 0422 01/31/24 0810 01/31/24 1155 01/31/24 1600  BP:  (!) 167/70 (!) 138/58 134/62  Pulse:  73 68 68  Resp:  16 18 18   Temp:  97.7 F (36.5 C) 98 F (36.7 C) 98.1 F (36.7 C)  TempSrc:  Oral  Oral  SpO2:  100% 100% 98%  Weight: 56.9 kg     Height:          Latest Ref Rng & Units 01/31/2024    6:05 AM 01/29/2024   11:57 PM 01/29/2024    2:22 PM  CBC  WBC 4.0 - 10.5 K/uL 4.4  4.6  5.3   Hemoglobin 12.0 - 15.0 g/dL 7.5  8.2  7.4   Hematocrit 36.0 - 46.0 % 24.7  28.1  24.6   Platelets 150 - 400 K/uL 255  242  252        Latest Ref Rng & Units 01/31/2024    6:05 AM 01/29/2024   11:57 PM 01/29/2024    2:22 PM  BMP  Glucose 70 - 99 mg/dL 813  873  803   BUN 8 - 23 mg/dL 59  57  58   Creatinine 0.44 - 1.00 mg/dL 6.39  6.47  6.34   Sodium 135 - 145 mmol/L 138  142  141   Potassium 3.5 - 5.1 mmol/L 3.3  3.2  3.3   Chloride 98 - 111 mmol/L 100  104  103   CO2 22 - 32 mmol/L 26  26  24    Calcium  8.9 - 10.3 mg/dL 7.9  7.4  7.7      Author: Drue ONEIDA Potter, MD 01/31/2024 6:05 PM  For on call review [email or web address].

## 2024-01-31 NOTE — Progress Notes (Signed)
   01/31/24 1000  Spiritual Encounters  Type of Visit Initial  Care provided to: Patient  Conversation partners present during encounter Nurse  Reason for visit Advance directives  OnCall Visit Yes   Chaplain visited with patient in response to an AD entered in the system.  Chaplain took document to patient and explained it. Patient shared she understood what Chaplain shared.  Chaplain asked patient to have the Chaplain phone paged when a notary is needed.  Patient shared that she would like the Chaplain to sit with her and Chaplain said she'd return in the afternoon.    Rev. Rana M. Nicholaus, M.Div. Chaplain Resident  [facility]

## 2024-01-31 NOTE — Progress Notes (Signed)
 Progress Note  Patient Name: [name] [name] Date of Encounter: 01/31/2024  Primary Cardiologist: [facility]  Subjective   Reports breathing is better.  No frank chest pain.  Hemoglobin remains low at 7.5.  Documented net +504 mL for the admission.  Has been weaned from supplemental oxygen via nasal cannula at 2 L to room air.  Renal function remains elevated though is stable at 3.6.  Potassium remains low at 3.3.  Inpatient Medications    Scheduled Meds:  carvedilol   12.5 mg Oral BID WC   feeding supplement  237 mL Oral BID BM   heparin   5,000 Units Subcutaneous Q8H   hydrALAZINE   100 mg Oral TID   insulin  aspart  0-6 Units Subcutaneous TID WC   isosorbide  dinitrate  30 mg Oral TID   Continuous Infusions:  furosemide  (LASIX ) 200 mg in dextrose  5 % 100 mL (2 mg/mL) infusion 5 mg/hr (01/30/24 2101)   potassium chloride  10 mEq (01/31/24 1140)   PRN Meds: acetaminophen  **OR** acetaminophen , albuterol , hydrALAZINE , ondansetron  **OR** ondansetron  (ZOFRAN ) IV   Vital Signs    Vitals:   01/31/24 0419 01/31/24 0422 01/31/24 0810 01/31/24 1155  BP: (!) 141/72  (!) 167/70 (!) 138/58  Pulse: 72  73 68  Resp: 16  16 18   Temp: 98.1 F (36.7 C)  97.7 F (36.5 C) 98 F (36.7 C)  TempSrc:   Oral   SpO2: 97%  100% 100%  Weight:  56.9 kg    Height:        Intake/Output Summary (Last 24 hours) at 01/31/2024 1228 Last data filed at 01/31/2024 0900 Gross per 24 hour  Intake 554.03 ml  Output 200 ml  Net 354.03 ml   Filed Weights   01/29/24 1421 01/31/24 0422  Weight: 50.3 kg 56.9 kg    Telemetry    Sinus rhythm, 70s bpm - Personally Reviewed  ECG    No new tracings - Personally Reviewed  Physical Exam   GEN: No acute distress.   Neck: No JVD. Cardiac: RRR, no murmurs, rubs, or gallops.  Respiratory: Clear to auscultation bilaterally.  GI: Soft, nontender, non-distended.   MS: No edema; No deformity. Neuro:  Alert and oriented x 3; Nonfocal.  Psych: Normal affect.  Labs     Chemistry Recent Labs  Lab 01/29/24 1422 01/29/24 2357 01/31/24 0605  NA 141 142 138  K 3.3* 3.2* 3.3*  CL 103 104 100  CO2 24 26 26   GLUCOSE 196* 126* 186*  BUN 58* 57* 59*  CREATININE 3.65* 3.52* 3.60*  CALCIUM  7.7* 7.4* 7.9*  PROT 5.6* 5.5*  --   ALBUMIN 2.7* 2.6*  --   AST 39 28  --   ALT 19 17  --   ALKPHOS 106 109  --   BILITOT 0.6 0.6  --   GFRNONAA 13* 14* 13*  ANIONGAP 14 12 12      Hematology Recent Labs  Lab 01/29/24 1422 01/29/24 2357 01/31/24 0605  WBC 5.3 4.6 4.4  RBC 2.48* 2.77* 2.56*  HGB 7.4* 8.2* 7.5*  HCT 24.6* 28.1* 24.7*  MCV 99.2 101.4* 96.5  MCH 29.8 29.6 29.3  MCHC 30.1 29.2* 30.4  RDW 14.5 14.5 14.4  PLT 252 242 255    Cardiac EnzymesNo results for input(s): TROPONINI in the last 168 hours. No results for input(s): TROPIPOC in the last 168 hours.   BNP Recent Labs  Lab 01/29/24 1422  BNP 3,838.7*     DDimer No results for input(s): DDIMER  in the last 168 hours.   Radiology    US  RENAL Result Date: 01/30/2024 IMPRESSION: 1. Bilateral echogenic kidneys which may represent sequelae associated with medical renal disease. 2. Simple left renal cysts. Electronically Signed   By: Suzen Dials M.D.   On: 01/30/2024 15:45   DG Chest 2 View Result Date: 01/29/2024 IMPRESSION: 1. Bilateral pleural effusions and LEFT basilar atelectasis. 2. No overt pulmonary edema. Electronically Signed   By: Jackquline Boxer M.D.   On: 01/29/2024 15:17    Cardiac Studies   12/31/2023 Echo complete   1. The left ventricle is normal in size with mildly increased wall  thickness.   2. The left ventricular systolic function is borderline, LVEF is visually  estimated at 50-55%.    3. There is grade II diastolic dysfunction (elevated filling pressure).    4. The right ventricle is normal in size, with normal systolic function.    5. There is mild mitral valve regurgitation.   Patient Profile     68 y.o. 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  Assessment & Plan    1.  Acute on chronic HFpEF with history of HFimpEF: - Likely exacerbated by progressive anemia, underlying renal dysfunction, and poorly controlled hypertension - Responding well to Lasix  drip, appreciate nephrology assistance -Remains on carvedilol , Imdur, and hydralazine  - Not on MRA with underlying renal dysfunction  2.  CAD involving native coronary arteries with elevated high-sensitivity troponin: - Minimally elevated and flat trending high-sensitivity troponin, not consistent with ACS and likely secondary to supply/demand ischemia in the setting of underlying renal dysfunction, volume overload, and progressive anemia - Recent echo showed low normal LV systolic function - No indication for heparin  drip with progressive anemia - No plans for inpatient ischemic evaluation at this time - Not on aspirin  with underlying anemia - Consider resuming PTA atorvastatin   3.  HTN: - Blood pressure improving - Carvedilol  titrated to 12.5 mg twice daily with continuation of hydralazine  100 mg 3 times daily and Isordil  30 mg 3 times daily  4.  Acute on CKD stage IV with anemia of chronic disease: - Renal function stable - Appreciate nephrology assistance  5.  History of medication nonadherence: - Reports compliance      For questions or updates, please contact [facility] Please consult [email or web address] for contact info under Cardiology/STEMI.    Signed, Bernardino Bring, PA-C [facility] Pager: [phone] 01/31/2024, 12:28 PM

## 2024-01-31 NOTE — Care Management Important Message (Signed)
 Important Message  Patient Details  Name: TERIE LEAR MRN: [id number] Date of Birth: [birth year]   Important Message Given:  Yes - Medicare IM     [name] 01/31/2024, 12:08 PM

## 2024-01-31 NOTE — Plan of Care (Signed)

## 2024-01-31 NOTE — Plan of Care (Signed)
   Problem: Coping: Goal: Ability to adjust to condition or change in health will improve Outcome: Progressing

## 2024-01-31 NOTE — Progress Notes (Signed)
   01/31/24 1330  Spiritual Encounters  Type of Visit Follow up  Care provided to: Patient  Conversation partners present during encounter Nurse  Reason for visit Routine spiritual support  OnCall Visit Yes   Chaplain visited with patient to follow-up on an AD she left for patient earlier in the day.  Patient shared she understood and would check with her daughter on how to handle.  Patient shared some of her health concerns with Chaplain and how her faith has helped her through them all.  Chaplain offered to pray with patient and she accepted.  Chaplain and patient prayed and embraced at the end of the visit.  Chaplain let patient know that Chaplains are available 24/7.  Rev. Rana M. Nicholaus, M.Div. Chaplain Resident [facility]

## 2024-02-01 ENCOUNTER — Encounter (INDEPENDENT_AMBULATORY_CARE_PROVIDER_SITE_OTHER)

## 2024-02-01 ENCOUNTER — Ambulatory Visit (INDEPENDENT_AMBULATORY_CARE_PROVIDER_SITE_OTHER): Admitting: Vascular Surgery

## 2024-02-01 DIAGNOSIS — I5023 Acute on chronic systolic (congestive) heart failure: Secondary | ICD-10-CM | POA: Diagnosis not present

## 2024-02-01 DIAGNOSIS — I5033 Acute on chronic diastolic (congestive) heart failure: Secondary | ICD-10-CM | POA: Diagnosis not present

## 2024-02-01 DIAGNOSIS — N184 Chronic kidney disease, stage 4 (severe): Secondary | ICD-10-CM | POA: Diagnosis not present

## 2024-02-01 DIAGNOSIS — I10 Essential (primary) hypertension: Secondary | ICD-10-CM | POA: Diagnosis not present

## 2024-02-01 LAB — BASIC METABOLIC PANEL WITH GFR
Anion gap: 10 (ref 5–15)
BUN: 65 mg/dL — ABNORMAL HIGH (ref 8–23)
CO2: 23 mmol/L (ref 22–32)
Calcium: 8 mg/dL — ABNORMAL LOW (ref 8.9–10.3)
Chloride: 103 mmol/L (ref 98–111)
Creatinine, Ser: 3.73 mg/dL — ABNORMAL HIGH (ref 0.44–1.00)
GFR, Estimated: 13 mL/min — ABNORMAL LOW (ref >60)
Glucose, Bld: 270 mg/dL — ABNORMAL HIGH (ref 70–99)
Potassium: 3.9 mmol/L (ref 3.5–5.1)
Sodium: 136 mmol/L (ref 135–145)

## 2024-02-01 LAB — IRON AND TIBC
Iron: 38 ug/dL (ref 28–170)
Saturation Ratios: 18 % (ref 10.4–31.8)
TIBC: 213 ug/dL — ABNORMAL LOW (ref 250–450)
UIBC: 175 ug/dL

## 2024-02-01 LAB — GLUCOSE, CAPILLARY
Glucose-Capillary: 216 mg/dL — ABNORMAL HIGH (ref 70–99)
Glucose-Capillary: 293 mg/dL — ABNORMAL HIGH (ref 70–99)
Glucose-Capillary: 130 mg/dL — ABNORMAL HIGH (ref 70–99)
Glucose-Capillary: 226 mg/dL — ABNORMAL HIGH (ref 70–99)

## 2024-02-01 LAB — HEPATITIS B CORE ANTIBODY, TOTAL: HEP B CORE AB: NEGATIVE

## 2024-02-01 LAB — CBC WITH DIFFERENTIAL/PLATELET
Abs Immature Granulocytes: 0.02 K/uL (ref 0.00–0.07)
Basophils Absolute: 0 K/uL (ref 0.0–0.1)
Basophils Relative: 0 %
Eosinophils Absolute: 0.2 K/uL (ref 0.0–0.5)
Eosinophils Relative: 4 %
HCT: 23.1 % — ABNORMAL LOW (ref 36.0–46.0)
Hemoglobin: 7.3 g/dL — ABNORMAL LOW (ref 12.0–15.0)
Immature Granulocytes: 0 %
Lymphocytes Relative: 19 %
Lymphs Abs: 0.9 K/uL (ref 0.7–4.0)
MCH: 30 pg (ref 26.0–34.0)
MCHC: 31.6 g/dL (ref 30.0–36.0)
MCV: 95.1 fL (ref 80.0–100.0)
Monocytes Absolute: 0.5 K/uL (ref 0.1–1.0)
Monocytes Relative: 10 %
Neutro Abs: 3.1 K/uL (ref 1.7–7.7)
Neutrophils Relative %: 67 %
Platelets: 245 K/uL (ref 150–400)
RBC: 2.43 MIL/uL — ABNORMAL LOW (ref 3.87–5.11)
RDW: 14.6 % (ref 11.5–15.5)
WBC: 4.8 K/uL (ref 4.0–10.5)
nRBC: 0 % (ref 0.0–0.2)

## 2024-02-01 LAB — HEPATITIS B SURFACE ANTIBODY, QUANTITATIVE: Hep B S AB Quant (Post): <3.5 m[IU]/mL — ABNORMAL LOW

## 2024-02-01 LAB — PREPARE RBC (CROSSMATCH)

## 2024-02-01 MED ORDER — SODIUM CHLORIDE 0.9% IV SOLUTION: Freq: Once | INTRAVENOUS | Status: DC

## 2024-02-01 MED ORDER — INSULIN GLARGINE-YFGN 100 UNIT/ML ~~LOC~~ SOLN
4.0000 [IU] | Freq: Every day | SUBCUTANEOUS | Status: DC
  Administered 2024-02-01 – 2024-02-03 (×3): 4 [IU] via SUBCUTANEOUS
  Filled 2024-02-01 (×4): qty 0.04

## 2024-02-01 MED ORDER — DULOXETINE HCL 20 MG PO CPEP
40.0000 mg | ORAL_CAPSULE | Freq: Every day | ORAL | Status: DC
  Administered 2024-02-01 – 2024-02-02 (×2): 40 mg via ORAL
  Filled 2024-02-01 (×3): qty 2

## 2024-02-01 MED ORDER — TORSEMIDE 20 MG PO TABS
80.0000 mg | ORAL_TABLET | Freq: Every day | ORAL | Status: DC
  Administered 2024-02-01 – 2024-02-03 (×3): 80 mg via ORAL
  Filled 2024-02-01 (×3): qty 4

## 2024-02-01 MED ORDER — GUAIFENESIN-DM 100-10 MG/5ML PO SYRP
5.0000 mL | ORAL_SOLUTION | ORAL | Status: DC | PRN
  Administered 2024-02-01: 5 mL via ORAL
  Filled 2024-02-01: qty 10

## 2024-02-01 NOTE — Progress Notes (Signed)
 Initial Nutrition Assessment  DOCUMENTATION CODES:   Not applicable  INTERVENTION:   -Continue carb modified diet -Continue Nepro Shake po BID, each supplement provides 425 kcal and 19 grams protein  -Renal MVI daily -Provided education on low sodium diet; RD provided Low Sodium Nutrition Therapy handout from [facility] Nutrition Care Manual  NUTRITION DIAGNOSIS:   Increased nutrient needs related to chronic illness as evidenced by estimated needs.  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  Pt admitted with CHF complicated by CKD IV with fluid overload.   7/24- AVG placed  Reviewed I/O's: -256 ml x 24 hours and -232 ml since admission  UOP: 1.4 L x 24 hours  Spoke with pt at bedside, who was pleasant and in good spirits today. Pt smiling throughout interview and eager to engage RD in conversation. Pt reports she has a good appetite, but was not thrilled with the bagel she was served as it is not something she regularly eats. Her intake has improved since hospitalization. Per pt, she has poor oral intake for 1 week PTA due to generally feeling unwell, shortness of breath, and fluid retention.   Pt shares that she has had a lot fo challenges since 2021, when she underwent lt BKA. Since that time, she has been unable to return to her home and has been living with family members, which she describes as not an ideal situation. Pt reports that following a diet can be difficult at times, as her family prepares a lot of fatty  foods. Pt reports her favorite foods include pinto beans and mac and cheese. She has a lot been trying to reduce sodium in her diet, but this is also difficulty as her family members prepare foods with a lot of salt. Pt has been consuming the salt free seasoning blend in the hospital and enjoys it. Noted meal completions 70-100%.   Pt shares she is working towards moving to her own place and desire to choose healthier options. Pt admits to eating a lot of processed foods, but has been making better choices here. RD reviewed healthier options for pt; pt intends to prepare food at home so that she has more control of the ingredients and additives put in her foods. RD reviewed ways to decrease sodium in her diet as well as salt free seasonings. Pt very appreciative; discussed how reducing sodium would help manage her CHF and CKD. Per nephrology notes, no need for HD now, but pt aware that she will need it sooner rather than later.   Noted wt gain over the past month. RD suspects this is related to fluid retention.   Discussed importance of good meal and supplement intake to promote healing.   Medications reviewed and include lasix  and demadex .   Lab Results  Component Value Date   HGBA1C 6.4 (A) 11/29/2023   PTA DM medications are 8 units insulin  lispro TID. Pt uses a Dexcom CGM (which she has used for the past 6 months);  she enjoys this and has seen improvement in her glycemic control. Pt is followed by [facility]- insulin  goals are as follows: oal 1: Lantus  : 3 units daily Goal 2: Humalog  3 units with breakfast and lunch increase to 4 units with dinner plus sliding scale before meals 70-150 0 U 151-200 1 U 201-250 2 U 251-300 3 U 301-350 4U 351 + 5 U   Labs reviewed: CBGS: 148-333 (inpatient orders for glycemic control are 0-6 units insulin  aspart TID with meals and 4 units insulin  glargine daily).    NUTRITION - FOCUSED PHYSICAL EXAM:  Flowsheet Row Most Recent Value  Orbital  Region No depletion  Upper Arm Region Mild depletion  Thoracic and Lumbar Region No depletion  Buccal Region No depletion  Temple Region No depletion  Clavicle Bone Region Mild depletion  Clavicle and Acromion Bone Region Mild depletion  Scapular Bone Region Mild depletion  Dorsal Hand No depletion  Patellar Region Mild depletion  Anterior Thigh Region Mild depletion  Posterior Calf Region Mild depletion  Edema (RD Assessment) None  Hair Reviewed  Eyes Reviewed  Mouth Reviewed  Skin Reviewed  Nails Reviewed    Diet Order:   Diet Order             Diet Carb Modified Fluid consistency: Thin  Diet effective now                   EDUCATION NEEDS:   Education needs have been addressed  Skin:  Skin Assessment: Skin Integrity Issues: Skin Integrity Issues:: Incisions Incisions: closed surgical incision to lt arm (s/p AVG on 01/10/24)  Last BM:  01/31/24 (type 7)  Height:   Ht Readings from Last 1 Encounters:  01/29/24 5' 5 (1.651 m)    Weight:   Wt Readings from Last 1 Encounters:  02/01/24 56.7 kg    Ideal Body Weight:  53.1 kg (adjusted for lt BKA)  BMI:  Body mass index is 20.8 kg/m.  Estimated Nutritional Needs:   Kcal:  1600-1800  Protein:  85-100 grams  Fluid:  1.6-1.8 L    Margery ORN, RD, LDN, CDCES Registered Dietitian III Certified Diabetes Care and Education Specialist If unable to reach this RD, please use RD Inpatient group chat on secure chat between hours of 8am-4 pm daily
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
## 2024-02-01 NOTE — Progress Notes (Signed)
 Progress Note  Patient Name: [name] [name] Date of Encounter: 02/01/2024  Primary Cardiologist: [facility]  Subjective   Transitioned from Lasix  gtt to torsemide  80 mg daily by nephrology today. Ins and outs incomplete. BP improving. Renal function remains elevated, though stable. Potassium improved. No chest pain. Dyspnea continues to improve.   Inpatient Medications    Scheduled Meds:  carvedilol   12.5 mg Oral BID WC   DULoxetine   40 mg Oral Daily   feeding supplement (NEPRO CARB STEADY)  237 mL Oral TID BM   heparin   5,000 Units Subcutaneous Q8H   hydrALAZINE   100 mg Oral TID   insulin  aspart  0-6 Units Subcutaneous TID WC   insulin  glargine-yfgn  4 Units Subcutaneous Daily   isosorbide  dinitrate  30 mg Oral TID   torsemide   80 mg Oral Daily   Continuous Infusions:   PRN Meds: acetaminophen  **OR** acetaminophen , albuterol , guaiFENesin -dextromethorphan , hydrALAZINE , ondansetron  **OR** ondansetron  (ZOFRAN ) IV   Vital Signs    Vitals:   02/01/24 0446 02/01/24 0500 02/01/24 0815 02/01/24 1242  BP: 134/62  (!) 146/61 (!) 144/61  Pulse: 69  67 75  Resp: 17  18 18   Temp: 98.5 F (36.9 C)  98.5 F (36.9 C) 98.4 F (36.9 C)  TempSrc: Oral     SpO2: 100%  100% 99%  Weight:  56.7 kg    Height:        Intake/Output Summary (Last 24 hours) at 02/01/2024 1341 Last data filed at 02/01/2024 1017 Gross per 24 hour  Intake 733.93 ml  Output 1350 ml  Net -616.07 ml   Filed Weights   01/29/24 1421 01/31/24 0422 02/01/24 0500  Weight: 50.3 kg 56.9 kg 56.7 kg    Telemetry    Sinus rhythm, 60s to 70s bpm - Personally Reviewed  ECG    No new tracings - Personally Reviewed  Physical Exam   GEN: No acute distress.   Neck: No JVD. Cardiac: RRR, no murmurs, rubs, or gallops.  Respiratory: Clear to auscultation bilaterally.  GI: Soft, nontender, non-distended.   MS: No edema; status post right BKA. Neuro:  Alert and oriented x 3; Nonfocal.  Psych: Normal affect.  Labs     Chemistry Recent Labs  Lab 01/29/24 1422 01/29/24 2357 01/31/24 0605 02/01/24 0427  NA 141 142 138 136  K 3.3* 3.2* 3.3* 3.9  CL 103 104 100 103  CO2 24 26 26 23   GLUCOSE 196* 126* 186* 270*  BUN 58* 57* 59* 65*  CREATININE 3.65* 3.52* 3.60* 3.73*  CALCIUM  7.7* 7.4* 7.9* 8.0*  PROT 5.6* 5.5*  --   --   ALBUMIN 2.7* 2.6*  --   --   AST 39 28  --   --   ALT 19 17  --   --   ALKPHOS 106 109  --   --   BILITOT 0.6 0.6  --   --   GFRNONAA 13* 14* 13* 13*  ANIONGAP 14 12 12 10      Hematology Recent Labs  Lab 01/29/24 2357 01/31/24 0605 02/01/24 0427  WBC 4.6 4.4 4.8  RBC 2.77* 2.56* 2.43*  HGB 8.2* 7.5* 7.3*  HCT 28.1* 24.7* 23.1*  MCV 101.4* 96.5 95.1  MCH 29.6 29.3 30.0  MCHC 29.2* 30.4 31.6  RDW 14.5 14.4 14.6  PLT 242 255 245    Cardiac EnzymesNo results for input(s): TROPONINI in the last 168 hours. No results for input(s): TROPIPOC in the last 168 hours.   BNP  Recent Labs  Lab 01/29/24 1422  BNP 3,838.7*     DDimer No results for input(s): DDIMER in the last 168 hours.   Radiology    US  RENAL Result Date: 01/30/2024 IMPRESSION: 1. Bilateral echogenic kidneys which may represent sequelae associated with medical renal disease. 2. Simple left renal cysts. Electronically Signed   By: Suzen Dials M.D.   On: 01/30/2024 15:45   DG Chest 2 View Result Date: 01/29/2024 IMPRESSION: 1. Bilateral pleural effusions and LEFT basilar atelectasis. 2. No overt pulmonary edema. Electronically Signed   By: Jackquline Boxer M.D.   On: 01/29/2024 15:17    Cardiac Studies   12/31/2023 Echo complete   1. The left ventricle is normal in size with mildly increased wall  thickness.   2. The left ventricular systolic function is borderline, LVEF is visually  estimated at 50-55%.    3. There is grade II diastolic dysfunction (elevated filling pressure).    4. The right ventricle is normal in size, with normal systolic function.    5. There is mild mitral  valve regurgitation.   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  Assessment & Plan    1.  Acute on chronic HFpEF with history of HFimpEF: - Likely exacerbated by progressive anemia, underlying renal dysfunction, and poorly controlled hypertension - Responded well to Lasix  drip, now transitioned to torsemide  80 mg daily by nephrology  - I/O incomplete  - Remains on carvedilol , Imdur, and hydralazine  - Not on MRA with underlying renal dysfunction  2.  CAD involving native coronary arteries with elevated high-sensitivity troponin: - Minimally elevated and flat trending high-sensitivity troponin, not consistent with ACS and likely secondary to supply/demand ischemia in the setting of underlying renal dysfunction, volume overload, and progressive anemia - Recent echo showed low normal LV systolic function - No indication for heparin  drip with progressive anemia - No plans for inpatient ischemic evaluation at this time - Not on aspirin  with underlying anemia - Consider resuming PTA atorvastatin   3.  HTN: - Blood pressure improving to the 130s to 140s mmHg systolic - Carvedilol  recently titrated to 12.5 mg twice daily with continuation of hydralazine  100 mg 3 times daily and Isordil  30 mg 3 times daily  4.  Acute on CKD stage IV with anemia of chronic disease: - Renal function stable - Hgb remains low at 7.3, denies bleeding - Contributing to her presentation  - Appreciate nephrology assistance  5.  History of medication nonadherence: - Reports compliance      For questions or updates, please  contact [facility] Please consult [email or web address] for contact info under Cardiology/STEMI.    Signed, Bernardino Bring, PA-C [facility] Pager: [phone] 02/01/2024, 1:41 PM
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
## 2024-02-01 NOTE — Discharge Instructions (Signed)

## 2024-02-01 NOTE — Inpatient Diabetes Management (Signed)
 Inpatient Diabetes Program Recommendations  AACE/ADA: New Consensus Statement on Inpatient Glycemic Control (2015)  Target Ranges:  Prepandial:   less than 140 mg/dL      Peak postprandial:   less than 180 mg/dL (1-2 hours)      Critically ill patients:  140 - 180 mg/dL    Latest Reference Range & Units 01/31/24 08:08 01/31/24 11:54 01/31/24 16:15 01/31/24 17:18 01/31/24 23:08  Glucose-Capillary 70 - 99 mg/dL 851 (H) 666 (H)  4 units Novolog   305 (H)  4 units Novolog   264 (H) 228 (H)  (H): Data is abnormally high  Latest Reference Range & Units 02/01/24 08:13  Glucose-Capillary 70 - 99 mg/dL 783 (H)  2 units Novolog    (H): Data is abnormally high   Home DM Meds: Dexcom G7 CGM          Humalog  4 units TID with meals       Lantus  4 units at HS   Current Orders: Novolog  0-6 units TID   MD- Note CBGs >200 since yesterday  Pt takes low dose basal insulin  at home  Please consider adding Semglee  3 units Q24H (75% home dose)   ENDO: Dr. Fayette with [facility] Last Seen 12/11/2023 Was told to take the following: Goal 1: Lantus  : 3 units daily Goal 2: Humalog  3 units with breakfast and lunch increase to 4 units with dinner plus sliding scale before meals 70-150 0 U 151-200 1 U 201-250 2 U 251-300 3 U 301-350 4U 351 + 5 U     --Will follow patient during hospitalization--  Adina Rudolpho Arrow RN, MSN, CDCES Diabetes Coordinator Inpatient Glycemic Control Team Team Pager: [phone] (8a-5p)

## 2024-02-01 NOTE — Progress Notes (Addendum)
 Progress Note   Patient: [name] [name] FMW:[id number] DOB: 04-02-1956 DOA: 01/29/2024     3 DOS: the patient was seen and examined on 02/01/2024     Brief hospital course: [name] [name] is a 68 y.o. 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 Patient notes no fever/chills/ n/v/d/ chest pain or  note chronic abdominal pain. Patient also mentions decrease urine output over the last 24 hours.    Assessment and Plan:      Acute on chronic CHF ref complicated by CKDIV  with fluid overload with acute hypoxic respiratory failure. -ef 25% on echo 12/2023  Lasix  drip being changed to torsemide  today continue to monitor strict I/o , daily weight  Nephrologist on board we appreciate input -Due to renal failure not on GDMT Cardiology is on board and case discussed Nephrology is following   CKDIV -at chronic baseline  -has fistula placed  - plans for HD in near future      Acute on chronic anemia Likely secondary to anemia of chronic disease Hemoglobin noted to be 7.3 today and so we will transfuse 1 unit of packed RBC to keep hemoglobin greater than 8 in the setting of cardiac history   CAD s/p NSTEMI -diffuse disease CTO RCA  with collaterals  -continue medical management -CE flat  -EKG no hyperacute findings    Hx of VTE  -s/p bkA -s/p tx   Hx of Lung CA s/p LLL    HLD -continue statin /zetia   HTN Continue current antihypertensives -prn hydralazine  for now      DMII -iss/fs  -resume home regimen once med rec completed    Depression  -resume home regimen once med rec completed     DVT prophylaxis: heparin  Code Status: full/ as discussed per patient wishes in event of cardiac arrest    Family Communication: non family at bedside       Subjective:    Patient seen and examined at bedside this morning Respiratory function is improved Lasix  drip being transitioned to torsemide  by nephrologist today   Physical Exam:   [name] female laying in bed in no acute respiratory distress Neck: normal, supple, no masses, no thyromegaly Respiratory: Fine crackles noted at the lung bases bilaterally Cardiovascular: Regular rate and rhythm, no murmurs / rubs / gallops. +extremity edema., + sacral edema Abdomen: no tenderness, no masses palpated. No hepatosplenomegaly. Bowel sounds positive.  Musculoskeletal: no clubbing / cyanosis. No joint deformity upper and lower extremities. Good ROM, no contractures. Normal muscle tone.  Right bka Skin: no rashes, lesions, ulcers. No induration Neurologic: CN grossly intact. Sensation intact, Strength 5/5 in all 4.  Psychiatric: Normal judgment and insight. Alert and oriented x 3. Normal mood.        Data Reviewed:        Latest Ref Rng & Units 02/01/2024    4:27 AM 01/31/2024    6:05 AM 01/29/2024   11:57 PM  BMP  Glucose 70 - 99 mg/dL 729  813  873   BUN 8 - 23 mg/dL 65  59  57   Creatinine 0.44 - 1.00 mg/dL 6.26  6.39  6.47   Sodium 135 - 145  mmol/L 136  138  142   Potassium 3.5 - 5.1 mmol/L 3.9  3.3  3.2   Chloride 98 - 111 mmol/L 103  100  104   CO2 22 - 32 mmol/L 23  26  26    Calcium  8.9 - 10.3 mg/dL 8.0  7.9  7.4      Vitals:   02/01/24 0500 02/01/24 0815 02/01/24 1242 02/01/24 1639  BP:  (!) 146/61 (!) 144/61 134/63  Pulse:  67 75 70  Resp:  18 18 18   Temp:  98.5 F (36.9 C) 98.4 F (36.9 C) 98.3 F (36.8 C)  TempSrc:      SpO2:  100% 99% 100%  Weight: 56.7 kg     Height:         Author: Drue ONEIDA Potter, MD 02/01/2024 6:01 PM  For on call review [email or web address].

## 2024-02-01 NOTE — Progress Notes (Signed)
 [facility]  ROUNDING NOTE   Subjective:   Patient seen laying in bed Room air Pleasant attitude Denies shortness of breath  Creatinine 3.73  Objective:  Vital signs in last 24 hours:  Temp:  [97.6 F (36.4 C)-98.5 F (36.9 C)] 98.4 F (36.9 C) (08/15 1242) Pulse Rate:  [64-75] 75 (08/15 1242) Resp:  [17-19] 18 (08/15 1242) BP: (131-146)/(61-63) 144/61 (08/15 1242) SpO2:  [98 %-100 %] 99 % (08/15 1242) Weight:  [56.7 kg] 56.7 kg (08/15 0500)  Weight change: -0.2 kg Filed Weights   01/29/24 1421 01/31/24 0422 02/01/24 0500  Weight: 50.3 kg 56.9 kg 56.7 kg    Intake/Output: I/O last 3 completed shifts: In: 1168 [P.O.:720; I.V.:134; IV Piggyback:314] Out: 1550 [Urine:1550]   Intake/Output this shift:  Total I/O In: 120 [P.O.:120] Out: -   Physical Exam: General: NAD  Head: Normocephalic, atraumatic. Moist oral mucosal membranes  Eyes: Anicteric  Neck: Supple  Lungs:  Clear to auscultation, room air  Heart: Regular rate and rhythm  Abdomen:  Soft, nontender  Extremities:  No peripheral edema.  Neurologic: Awake, alert, conversant  Skin: Warm,dry, no rash       Basic Metabolic Panel: Recent Labs  Lab 01/29/24 1422 01/29/24 2357 01/31/24 0605 02/01/24 0427  NA 141 142 138 136  K 3.3* 3.2* 3.3* 3.9  CL 103 104 100 103  CO2 24 26 26 23   GLUCOSE 196* 126* 186* 270*  BUN 58* 57* 59* 65*  CREATININE 3.65* 3.52* 3.60* 3.73*  CALCIUM  7.7* 7.4* 7.9* 8.0*    Liver Function Tests: Recent Labs  Lab 01/29/24 1422 01/29/24 2357  AST 39 28  ALT 19 17  ALKPHOS 106 109  BILITOT 0.6 0.6  PROT 5.6* 5.5*  ALBUMIN 2.7* 2.6*   Recent Labs  Lab 01/29/24 1422  LIPASE 49   No results for input(s): AMMONIA in the last 168 hours.  CBC: Recent Labs  Lab 01/29/24 1422 01/29/24 2357 01/31/24 0605 02/01/24 0427  WBC 5.3 4.6 4.4 4.8  NEUTROABS  --   --  2.9 3.1  HGB 7.4* 8.2* 7.5* 7.3*  HCT 24.6* 28.1* 24.7* 23.1*  MCV 99.2 101.4* 96.5  95.1  PLT 252 242 255 245    Cardiac Enzymes: No results for input(s): CKTOTAL, CKMB, CKMBINDEX, TROPONINI in the last 168 hours.  BNP: Invalid input(s): POCBNP  CBG: Recent Labs  Lab 01/31/24 1615 01/31/24 1718 01/31/24 2308 02/01/24 0813 02/01/24 1239  GLUCAP 305* 264* 228* 216* 293*    Microbiology: Results for orders placed or performed during the hospital encounter of 01/29/24  C Difficile Quick Screen w PCR reflex     Status: None   Collection Time: 01/31/24  1:38 PM   Specimen: STOOL  Result Value Ref Range Status   C Diff antigen NEGATIVE NEGATIVE Final   C Diff toxin NEGATIVE NEGATIVE Final   C Diff interpretation No C. difficile detected.  Final    Comment: Performed at [facility], [address] [place], [place] [address]    Coagulation Studies: No results for input(s): LABPROT, INR in the last 72 hours.  Urinalysis: Recent Labs    01/29/24 2045  COLORURINE YELLOW*  LABSPEC 1.014  PHURINE 8.0  GLUCOSEU 50*  HGBUR NEGATIVE  BILIRUBINUR NEGATIVE  KETONESUR NEGATIVE  PROTEINUR >=300*  NITRITE NEGATIVE  LEUKOCYTESUR NEGATIVE      Imaging: US  RENAL Result Date: 01/30/2024 CLINICAL DATA:  Acute kidney failure. EXAM: RENAL / URINARY TRACT ULTRASOUND COMPLETE COMPARISON:  August 24, 2021 FINDINGS: Right Kidney: Renal measurements: 9.5 cm x 3.8 cm x 5.0 cm = volume: 95.5 mL. Diffusely increased echogenicity of the renal parenchyma is noted. No mass or hydronephrosis visualized. Left Kidney: Renal measurements: 8.3 cm x 4.6 cm x 4.4 cm = volume: 87.6 mL. Diffusely increased echogenicity of the renal parenchyma is noted. A 2.3 cm x 2.1 cm x 2.4 cm simple cyst is seen within the lower pole of the left kidney. No hydronephrosis is visualized. Bladder: Appears normal for degree of bladder distention. Other: None. IMPRESSION: 1. Bilateral echogenic kidneys which may represent sequelae associated with medical renal disease. 2. Simple left  renal cysts. Electronically Signed   By: Suzen Dials M.D.   On: 01/30/2024 15:45     Medications:      carvedilol   12.5 mg Oral BID WC   DULoxetine   40 mg Oral Daily   feeding supplement (NEPRO CARB STEADY)  237 mL Oral TID BM   heparin   5,000 Units Subcutaneous Q8H   hydrALAZINE   100 mg Oral TID   insulin  aspart  0-6 Units Subcutaneous TID WC   insulin  glargine-yfgn  4 Units Subcutaneous Daily   isosorbide  dinitrate  30 mg Oral TID   torsemide   80 mg Oral Daily   acetaminophen  **OR** acetaminophen , albuterol , guaiFENesin -dextromethorphan , hydrALAZINE , ondansetron  **OR** ondansetron  (ZOFRAN ) IV  Assessment/ Plan:  [name] [name] is a 68 y.o.  female  with medical history to include CAD, diabetes, cocaine abuse, depression. HTN, lung ca with LLL lobectomy and chronic kidney disease stage 4, who was admitted to [facility] on 01/29/2024 for CHF (congestive heart failure) (HCC) [I50.9] Dyspnea, unspecified type [R06.00] Acute on chronic congestive heart failure, unspecified heart failure type (HCC) [I50.9]   Acute kidney injury on chronic kidney disease stage 4. Baseline creatinine appears to be 3.41 with GFR 14 on 12/17/23. AKI secondary to volume overload vs progression of kidney disease. Will order renal ultrasound to evaluate for obstruction. Patient had left AVG placed on 7/24 by Dr Marea.  Renal function continues to slowly deteriorate. Will transition onto oral diuretics. Patient will need to continue to follow up with [facility] nephrology at discharge. No acute indication to start dialysis during this admission.   Lab Results  Component Value Date   CREATININE 3.73 (H) 02/01/2024   CREATININE 3.60 (H) 01/31/2024   CREATININE 3.52 (H) 01/29/2024    Intake/Output Summary (Last 24 hours) at 02/01/2024 1317 Last data filed at 02/01/2024 1017 Gross per 24 hour  Intake 733.93 ml  Output 1350 ml  Net -616.07 ml   2. Chronic diastolic heart failure, ECHO from July 2025 show EF 50-55%  with a grade II DD. Mild MVR. Stop IV Furosemide  drip, will order Torsemide  80mg  daily.   3. Anemia of chronic kidney disease Lab Results  Component Value Date   HGB 7.3 (L) 02/01/2024    Hgb 7.3.  Will order iron  studies. Will avoid ESA due to lung cancer history.   4. Hypertension with chronic kidney disease. Home regimen includes carvedilol , hydralazine , isosorbide , nifedipine, and torsemide . Blood pressure 144/61. Continue current regimen   LOS: 3 Asmar Brozek 8/15/20251:17 PM

## 2024-02-01 NOTE — Plan of Care (Signed)
   Problem: Coping: Goal: Ability to adjust to condition or change in health will improve Outcome: Progressing

## 2024-02-02 DIAGNOSIS — I2510 Atherosclerotic heart disease of native coronary artery without angina pectoris: Secondary | ICD-10-CM | POA: Diagnosis not present

## 2024-02-02 DIAGNOSIS — I5023 Acute on chronic systolic (congestive) heart failure: Secondary | ICD-10-CM | POA: Diagnosis not present

## 2024-02-02 DIAGNOSIS — I25118 Atherosclerotic heart disease of native coronary artery with other forms of angina pectoris: Secondary | ICD-10-CM

## 2024-02-02 DIAGNOSIS — I10 Essential (primary) hypertension: Secondary | ICD-10-CM

## 2024-02-02 DIAGNOSIS — I5033 Acute on chronic diastolic (congestive) heart failure: Secondary | ICD-10-CM | POA: Diagnosis not present

## 2024-02-02 LAB — CBC WITH DIFFERENTIAL/PLATELET
Abs Immature Granulocytes: 0.02 K/uL (ref 0.00–0.07)
Basophils Absolute: 0 K/uL (ref 0.0–0.1)
Basophils Relative: 1 %
Eosinophils Absolute: 0.2 K/uL (ref 0.0–0.5)
Eosinophils Relative: 4 %
HCT: 27.6 % — ABNORMAL LOW (ref 36.0–46.0)
Hemoglobin: 8.6 g/dL — ABNORMAL LOW (ref 12.0–15.0)
Immature Granulocytes: 1 %
Lymphocytes Relative: 18 %
Lymphs Abs: 0.8 K/uL (ref 0.7–4.0)
MCH: 29.6 pg (ref 26.0–34.0)
MCHC: 31.2 g/dL (ref 30.0–36.0)
MCV: 94.8 fL (ref 80.0–100.0)
Monocytes Absolute: 0.5 K/uL (ref 0.1–1.0)
Monocytes Relative: 11 %
Neutro Abs: 2.8 K/uL (ref 1.7–7.7)
Neutrophils Relative %: 65 %
Platelets: 229 K/uL (ref 150–400)
RBC: 2.91 MIL/uL — ABNORMAL LOW (ref 3.87–5.11)
RDW: 15.8 % — ABNORMAL HIGH (ref 11.5–15.5)
WBC: 4.3 K/uL (ref 4.0–10.5)
nRBC: 0 % (ref 0.0–0.2)

## 2024-02-02 LAB — GLUCOSE, CAPILLARY
Glucose-Capillary: 184 mg/dL — ABNORMAL HIGH (ref 70–99)
Glucose-Capillary: 179 mg/dL — ABNORMAL HIGH (ref 70–99)
Glucose-Capillary: 283 mg/dL — ABNORMAL HIGH (ref 70–99)
Glucose-Capillary: 249 mg/dL — ABNORMAL HIGH (ref 70–99)

## 2024-02-02 LAB — BASIC METABOLIC PANEL WITH GFR
Anion gap: 8 (ref 5–15)
BUN: 67 mg/dL — ABNORMAL HIGH (ref 8–23)
CO2: 25 mmol/L (ref 22–32)
Calcium: 8 mg/dL — ABNORMAL LOW (ref 8.9–10.3)
Chloride: 100 mmol/L (ref 98–111)
Creatinine, Ser: 3.59 mg/dL — ABNORMAL HIGH (ref 0.44–1.00)
GFR, Estimated: 13 mL/min — ABNORMAL LOW (ref >60)
Glucose, Bld: 194 mg/dL — ABNORMAL HIGH (ref 70–99)
Potassium: 4.4 mmol/L (ref 3.5–5.1)
Sodium: 133 mmol/L — ABNORMAL LOW (ref 135–145)

## 2024-02-02 LAB — HEMOGLOBIN A1C
Hgb A1c MFr Bld: 7.3 % — ABNORMAL HIGH (ref 4.8–5.6)
Mean Plasma Glucose: 163 mg/dL

## 2024-02-02 MED ORDER — CARVEDILOL 25 MG PO TABS
25.0000 mg | ORAL_TABLET | Freq: Two times a day (BID) | ORAL | Status: DC
  Administered 2024-02-02 – 2024-02-03 (×2): 25 mg via ORAL
  Filled 2024-02-02 (×2): qty 1

## 2024-02-02 MED ORDER — SODIUM CHLORIDE 0.9% FLUSH: 10.0000 mL | INTRAVENOUS | Status: DC | PRN

## 2024-02-02 MED ORDER — CHLORHEXIDINE GLUCONATE CLOTH 2 % EX PADS
6.0000 | MEDICATED_PAD | Freq: Every day | CUTANEOUS | Status: DC
  Administered 2024-02-02 – 2024-02-03 (×2): 6 via TOPICAL

## 2024-02-02 MED ORDER — CARVEDILOL 12.5 MG PO TABS
12.5000 mg | ORAL_TABLET | Freq: Once | ORAL | Status: AC
Start: 2024-02-02 — End: 2024-02-02
  Administered 2024-02-02: 12.5 mg via ORAL
  Filled 2024-02-02: qty 1

## 2024-02-02 MED ORDER — SODIUM CHLORIDE 0.9% FLUSH
10.0000 mL | Freq: Two times a day (BID) | INTRAVENOUS | Status: DC
  Administered 2024-02-02 (×2): 10 mL
  Administered 2024-02-03: 20 mL

## 2024-02-02 MED ORDER — ATORVASTATIN CALCIUM 80 MG PO TABS
80.0000 mg | ORAL_TABLET | Freq: Every day | ORAL | Status: DC
  Administered 2024-02-03: 80 mg via ORAL
  Filled 2024-02-02: qty 1

## 2024-02-02 NOTE — Plan of Care (Signed)

## 2024-02-02 NOTE — Progress Notes (Signed)
 Progress Note   Patient: [name] [name] FMW:[id number] DOB: 09-12-1955 DOA: 01/29/2024     4 DOS: the patient was seen and examined on 02/02/2024     Brief hospital course: [name] [name] is a 68 y.o. 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 Patient notes no fever/chills/ n/v/d/ chest pain or  note chronic abdominal pain. Patient also mentions decrease urine output over the last 24 hours.    Assessment and Plan:      Acute on chronic CHF ref complicated by CKDIV  with fluid overload with acute hypoxic respiratory failure. -ef 25% on echo 12/2023  Lasix  drip being changed to torsemide  today continue to monitor strict I/o , daily weight  Nephrologist on board we appreciate input -Due to renal failure not on GDMT Cardiology is on board and case discussed Nephrology is following   CKDIV -at chronic baseline  -has fistula placed  - plans for HD in near future      Acute on chronic anemia Likely secondary to anemia of chronic disease Patient received a unit of blood transfusion on 02/01/2024 monitor CBC   CAD s/p NSTEMI -diffuse disease CTO RCA  with collaterals  -continue medical management -CE flat  -EKG no hyperacute findings    Hx of VTE  -s/p bkA -s/p tx   Hx of Lung CA s/p LLL    HLD -continue statin /zetia   HTN Continue current antihypertensives -prn hydralazine  for now      DMII -iss/fs  -resume home regimen once med rec completed    Depression  -resume home regimen once med rec completed     DVT prophylaxis: heparin  Code Status: full/ as discussed per patient wishes in event of cardiac arrest   Family Communication: non family at bedside       Subjective:    Patient  seen and examined at bedside this morning Respiratory function is improved Lasix  drip being transitioned to torsemide  by nephrologist today   Physical Exam:   [name] [name] female laying in bed in no acute respiratory distress Neck: normal, supple, no masses, no thyromegaly Respiratory: Fine crackles noted at the lung bases bilaterally Cardiovascular: Regular rate and rhythm, no murmurs / rubs / gallops. +extremity edema., + sacral edema Abdomen: no tenderness, no masses palpated. No hepatosplenomegaly. Bowel sounds positive.  Musculoskeletal: no clubbing / cyanosis. No joint deformity upper and lower extremities. Good ROM, no contractures. Normal muscle tone.  Right bka Skin: no rashes, lesions, ulcers. No induration Neurologic: CN grossly intact. Sensation intact, Strength 5/5 in all 4.  Psychiatric: Normal judgment and insight. Alert and oriented x 3. Normal mood.        Data Reviewed:    Vitals:   02/02/24 0638 02/02/24 0814 02/02/24 1211 02/02/24 1526  BP: (!) 146/88 (!) 155/70 (!) 146/67 (!) 147/67  Pulse: 66 69 66 67  Resp: 18 18 19 18   Temp: 97.8 F (36.6 C) 97.9 F (36.6 C) 97.9 F (36.6 C) 97.7 F (36.5 C)  TempSrc: Axillary     SpO2: 97% 93% 100% 100%  Weight:      Height:          Latest Ref Rng & Units 02/02/2024  8:00 AM 02/01/2024    4:27 AM 01/31/2024    6:05 AM  CBC  WBC 4.0 - 10.5 K/uL 4.3  4.8  4.4   Hemoglobin 12.0 - 15.0 g/dL 8.6  7.3  7.5   Hematocrit 36.0 - 46.0 % 27.6  23.1  24.7   Platelets 150 - 400 K/uL 229  245  255        Latest Ref Rng & Units 02/02/2024    8:00 AM 02/01/2024    4:27 AM 01/31/2024    6:05 AM  BMP  Glucose 70 - 99 mg/dL 805  729  813   BUN 8 - 23 mg/dL 67  65  59   Creatinine 0.44 - 1.00 mg/dL 6.40  6.26  6.39   Sodium 135 - 145 mmol/L 133  136  138   Potassium 3.5 - 5.1 mmol/L 4.4  3.9  3.3   Chloride 98 - 111 mmol/L 100  103  100   CO2 22 - 32 mmol/L 25  23  26    Calcium  8.9 - 10.3 mg/dL 8.0  8.0  7.9       Author: Drue ONEIDA Potter, MD 02/02/2024 3:46 PM  For on call review [email or web address].

## 2024-02-02 NOTE — Progress Notes (Signed)
 [facility]  PROGRESS NOTE   Subjective:   Patient seen at bedside.  Denies any chest pain or shortness of breath at this time.  Objective:  Vital signs: Blood pressure (!) 147/67, pulse 67, temperature 97.7 F (36.5 C), resp. rate 18, height 5' 5 (1.651 m), weight 60.1 kg, SpO2 100%.  Intake/Output Summary (Last 24 hours) at 02/02/2024 1743 Last data filed at 02/02/2024 1300 Gross per 24 hour  Intake 910 ml  Output 300 ml  Net 610 ml   Filed Weights   01/31/24 0422 02/01/24 0500 02/02/24 0500  Weight: 56.9 kg 56.7 kg 60.1 kg     Physical Exam: General:  No acute distress  Head:  Normocephalic, atraumatic. Moist oral mucosal membranes  Eyes:  Anicteric  Neck:  Supple  Lungs:   Clear to auscultation, normal effort  Heart:  S1S2 no rubs  Abdomen:   Soft, nontender, bowel sounds present  Extremities:  peripheral edema.  Neurologic:  Awake, alert, following commands  Skin:  No lesions  Access:     Basic Metabolic Panel: Recent Labs  Lab 01/29/24 1422 01/29/24 2357 01/31/24 0605 02/01/24 0427 02/02/24 0800  NA 141 142 138 136 133*  K 3.3* 3.2* 3.3* 3.9 4.4  CL 103 104 100 103 100  CO2 24 26 26 23 25   GLUCOSE 196* 126* 186* 270* 194*  BUN 58* 57* 59* 65* 67*  CREATININE 3.65* 3.52* 3.60* 3.73* 3.59*  CALCIUM  7.7* 7.4* 7.9* 8.0* 8.0*   GFR: Estimated Creatinine Clearance: 14.1 mL/min (A) (by C-G formula based on SCr of 3.59 mg/dL (H)).  Liver Function Tests: Recent Labs  Lab 01/29/24 1422 01/29/24 2357  AST 39 28  ALT 19 17  ALKPHOS 106 109  BILITOT 0.6 0.6  PROT 5.6* 5.5*  ALBUMIN 2.7* 2.6*   Recent Labs  Lab 01/29/24 1422  LIPASE 49   No results for input(s): AMMONIA in the last 168 hours.  CBC: Recent Labs  Lab 01/29/24 1422 01/29/24 2357 01/31/24 0605 02/01/24 0427 02/02/24 0800  WBC 5.3 4.6 4.4 4.8 4.3  NEUTROABS  --   --  2.9 3.1 2.8  HGB 7.4* 8.2* 7.5* 7.3* 8.6*  HCT 24.6* 28.1* 24.7* 23.1* 27.6*  MCV 99.2 101.4*  96.5 95.1 94.8  PLT 252 242 255 245 229     HbA1C: Hemoglobin A1C  Date/Time Value Ref Range Status  11/29/2023 08:58 AM 6.4 (A) 4.0 - 5.6 % Final  06/09/2022 12:00 AM 7.4  Final  01/25/2021 12:00 AM 7.8  Final   Hgb A1c MFr Bld  Date/Time Value Ref Range Status  02/01/2024 04:27 AM 7.3 (H) 4.8 - 5.6 % Final    Comment:    (NOTE)         Prediabetes: 5.7 - 6.4         Diabetes: >6.4         Glycemic control for adults with diabetes: <7.0   08/25/2021 11:54 AM 7.6 (H) 4.8 - 5.6 % Final    Comment:             Prediabetes: 5.7 - 6.4          Diabetes: >6.4          Glycemic control for adults with diabetes: <7.0     Urinalysis: No results for input(s): COLORURINE, LABSPEC, PHURINE, GLUCOSEU, HGBUR, BILIRUBINUR, KETONESUR, PROTEINUR, UROBILINOGEN, NITRITE, LEUKOCYTESUR in the last 72 hours.  Invalid input(s): APPERANCEUR    Imaging: No results found.   Medications:  sodium chloride    Intravenous Once   [START ON 02/03/2024] atorvastatin   80 mg Oral Daily   carvedilol   25 mg Oral BID WC   Chlorhexidine  Gluconate Cloth  6 each Topical Daily   DULoxetine   40 mg Oral Daily   feeding supplement (NEPRO CARB STEADY)  237 mL Oral TID BM   heparin   5,000 Units Subcutaneous Q8H   hydrALAZINE   100 mg Oral TID   insulin  aspart  0-6 Units Subcutaneous TID WC   insulin  glargine-yfgn  4 Units Subcutaneous Daily   isosorbide  dinitrate  30 mg Oral TID   sodium chloride  flush  10-40 mL Intracatheter Q12H   torsemide   80 mg Oral Daily    Assessment/ Plan:     68 year old female with history of diabetes, peripheral vascular disease, hypertension, coronary artery disease, history of cocaine abuse, history of lung cancer status post lobectomy on chronic kidney disease now admitted with history of shortness of breath and dyspnea on exertion and found to have congestive heart failure.  #1: Chronic kidney disease: Patient has a creatinine of about 3.4 with a  GFR of less than 15 cc/min.  Patient recently had a left AV graft placed.  Continue torsemide  at 80 mg daily.  #2: Hypertension: Will continue torsemide , hydralazine  and carvedilol .  She is advised to stay on 2 g salt restricted diet.  #3: Anemia: Anemia secondary to chronic kidney disease versus iron  deficiency.  She will need Epogen and oral iron .  Will defer this to heme-onc secondary to history of lung cancer.  #4: Secondary hyperparathyroidism: Will continue vitamin D.  Labs and medications reviewed. Will continue to follow along with you.   LOS: 4 Keeli Roberg, MD [facility] 8/16/20255:43 PM

## 2024-02-02 NOTE — Progress Notes (Signed)
 Awaiting MD order to access [facility]

## 2024-02-02 NOTE — Progress Notes (Signed)
 Pt requests to speak to MD about her diagnoses and has questions if she has COPD/lung disease, pt is former smoker  On call [facility] contacted at this time re: pt requests melatonin

## 2024-02-02 NOTE — Progress Notes (Signed)
 Progress Note  Patient Name: [name] [name] Date of Encounter: 02/02/2024  Primary Cardiologist: [facility]  Subjective   No chest pain or dyspnea. Ins and outs incomplete. BP improving. Renal function stable.  Inpatient Medications    Scheduled Meds:  sodium chloride    Intravenous Once   carvedilol   12.5 mg Oral BID WC   Chlorhexidine  Gluconate Cloth  6 each Topical Daily   DULoxetine   40 mg Oral Daily   feeding supplement (NEPRO CARB STEADY)  237 mL Oral TID BM   heparin   5,000 Units Subcutaneous Q8H   hydrALAZINE   100 mg Oral TID   insulin  aspart  0-6 Units Subcutaneous TID WC   insulin  glargine-yfgn  4 Units Subcutaneous Daily   isosorbide  dinitrate  30 mg Oral TID   sodium chloride  flush  10-40 mL Intracatheter Q12H   torsemide   80 mg Oral Daily   Continuous Infusions:   PRN Meds: acetaminophen  **OR** acetaminophen , albuterol , guaiFENesin -dextromethorphan , hydrALAZINE , ondansetron  **OR** ondansetron  (ZOFRAN ) IV, sodium chloride  flush   Vital Signs    Vitals:   02/02/24 0418 02/02/24 0500 02/02/24 0638 02/02/24 0814  BP: (!) 140/66  (!) 146/88 (!) 155/70  Pulse: 66  66 69  Resp: 19  18 18   Temp: (!) 97.1 F (36.2 C)  97.8 F (36.6 C) 97.9 F (36.6 C)  TempSrc: Axillary  Axillary   SpO2: 97%  97% 93%  Weight:  60.1 kg    Height:        Intake/Output Summary (Last 24 hours) at 02/02/2024 0947 Last data filed at 02/02/2024 9361 Gross per 24 hour  Intake 550 ml  Output 300 ml  Net 250 ml   Filed Weights   01/31/24 0422 02/01/24 0500 02/02/24 0500  Weight: 56.9 kg 56.7 kg 60.1 kg    Telemetry    Sinus rhythm, 60s to 70s bpm - Personally Reviewed  ECG    No new tracings - Personally Reviewed  Physical Exam   GEN: No acute distress.   Neck: No JVD. Cardiac: RRR, no murmurs, rubs, or gallops.  Respiratory: Clear to auscultation bilaterally.  GI: Soft, nontender, non-distended.   MS: No edema; status post right BKA. Neuro:  Alert and oriented x 3;  Nonfocal.  Psych: Normal affect.  Labs    Chemistry Recent Labs  Lab 01/29/24 1422 01/29/24 2357 01/31/24 0605 02/01/24 0427 02/02/24 0800  NA 141 142 138 136 133*  K 3.3* 3.2* 3.3* 3.9 4.4  CL 103 104 100 103 100  CO2 24 26 26 23 25   GLUCOSE 196* 126* 186* 270* 194*  BUN 58* 57* 59* 65* 67*  CREATININE 3.65* 3.52* 3.60* 3.73* 3.59*  CALCIUM  7.7* 7.4* 7.9* 8.0* 8.0*  PROT 5.6* 5.5*  --   --   --   ALBUMIN 2.7* 2.6*  --   --   --   AST 39 28  --   --   --   ALT 19 17  --   --   --   ALKPHOS 106 109  --   --   --   BILITOT 0.6 0.6  --   --   --   GFRNONAA 13* 14* 13* 13* 13*  ANIONGAP 14 12 12 10 8      Hematology Recent Labs  Lab 01/31/24 0605 02/01/24 0427 02/02/24 0800  WBC 4.4 4.8 4.3  RBC 2.56* 2.43* 2.91*  HGB 7.5* 7.3* 8.6*  HCT 24.7* 23.1* 27.6*  MCV 96.5 95.1 94.8  MCH 29.3 30.0  29.6  MCHC 30.4 31.6 31.2  RDW 14.4 14.6 15.8*  PLT 255 245 229    Cardiac EnzymesNo results for input(s): TROPONINI in the last 168 hours. No results for input(s): TROPIPOC in the last 168 hours.   BNP Recent Labs  Lab 01/29/24 1422  BNP 3,838.7*     DDimer No results for input(s): DDIMER in the last 168 hours.   Radiology    US  RENAL Result Date: 01/30/2024 IMPRESSION: 1. Bilateral echogenic kidneys which may represent sequelae associated with medical renal disease. 2. Simple left renal cysts. Electronically Signed   By: Suzen Dials M.D.   On: 01/30/2024 15:45   DG Chest 2 View Result Date: 01/29/2024 IMPRESSION: 1. Bilateral pleural effusions and LEFT basilar atelectasis. 2. No overt pulmonary edema. Electronically Signed   By: Jackquline Boxer M.D.   On: 01/29/2024 15:17    Cardiac Studies   12/31/2023 Echo complete   1. The left ventricle is normal in size with mildly increased wall  thickness.   2. The left ventricular systolic function is borderline, LVEF is visually  estimated at 50-55%.    3. There is grade II diastolic dysfunction (elevated  filling pressure).    4. The right ventricle is normal in size, with normal systolic function.    5. There is mild mitral valve regurgitation.   Patient Profile     68 y.o. 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  Assessment & Plan    1.  Acute on chronic HFpEF with history of HFimpEF: - Likely exacerbated by progressive anemia, underlying renal dysfunction, and poorly controlled hypertension - Responded well to Lasix  drip, now transitioned to torsemide  80 mg daily by nephrology  - I/O incomplete  - Remains on carvedilol , Imdur, and hydralazine  - Not on MRA with underlying renal dysfunction  2.  CAD involving native coronary arteries with elevated high-sensitivity troponin: - Minimally elevated and flat trending high-sensitivity troponin, not consistent with ACS and likely secondary to supply/demand ischemia in the setting of underlying renal dysfunction, volume overload, and progressive anemia - Recent echo showed low normal LV systolic function - No indication for heparin  drip with progressive anemia - No plans for inpatient ischemic evaluation at this time - Not on aspirin  with underlying anemia - Consider resuming PTA atorvastatin   3.  HTN: - Blood pressure improving, though remains elevated - Titrate carvedilol  to 25 mg twice daily with continuation of hydralazine  100 mg 3 times daily and Isordil  30 mg 3 times daily  4.  Acute on CKD stage IV with anemia of chronic disease: - Renal function stable - Hgb improved at 8.6, denies bleeding - Contributing to her presentation  - Appreciate nephrology assistance  5.   History of medication nonadherence: - Reports compliance      For questions or updates, please contact [facility] Please consult [email or web address] for contact info under Cardiology/STEMI.    Signed, Bernardino Bring, PA-C [facility] Pager: [phone] 02/02/2024, 9:47 AM

## 2024-02-03 DIAGNOSIS — I5033 Acute on chronic diastolic (congestive) heart failure: Secondary | ICD-10-CM | POA: Diagnosis not present

## 2024-02-03 DIAGNOSIS — I2510 Atherosclerotic heart disease of native coronary artery without angina pectoris: Secondary | ICD-10-CM | POA: Diagnosis not present

## 2024-02-03 DIAGNOSIS — I5023 Acute on chronic systolic (congestive) heart failure: Secondary | ICD-10-CM | POA: Diagnosis not present

## 2024-02-03 DIAGNOSIS — I10 Essential (primary) hypertension: Secondary | ICD-10-CM | POA: Diagnosis not present

## 2024-02-03 LAB — TYPE AND SCREEN
ABO/RH(D): B POS
Antibody Screen: NEGATIVE
Unit division: 0

## 2024-02-03 LAB — BPAM RBC
Blood Product Expiration Date: 202509172359
ISSUE DATE / TIME: 202508160354
Unit Type and Rh: 5100

## 2024-02-03 LAB — BASIC METABOLIC PANEL WITH GFR
Anion gap: 10 (ref 5–15)
BUN: 70 mg/dL — ABNORMAL HIGH (ref 8–23)
CO2: 24 mmol/L (ref 22–32)
Calcium: 8.1 mg/dL — ABNORMAL LOW (ref 8.9–10.3)
Chloride: 100 mmol/L (ref 98–111)
Creatinine, Ser: 3.34 mg/dL — ABNORMAL HIGH (ref 0.44–1.00)
GFR, Estimated: 15 mL/min — ABNORMAL LOW (ref >60)
Glucose, Bld: 167 mg/dL — ABNORMAL HIGH (ref 70–99)
Potassium: 4.5 mmol/L (ref 3.5–5.1)
Sodium: 134 mmol/L — ABNORMAL LOW (ref 135–145)

## 2024-02-03 LAB — GLUCOSE, CAPILLARY
Glucose-Capillary: 157 mg/dL — ABNORMAL HIGH (ref 70–99)
Glucose-Capillary: 192 mg/dL — ABNORMAL HIGH (ref 70–99)
Glucose-Capillary: 271 mg/dL — ABNORMAL HIGH (ref 70–99)

## 2024-02-03 MED ORDER — TORSEMIDE 20 MG PO TABS
80.0000 mg | ORAL_TABLET | Freq: Every day | ORAL | 0 refills | Status: DC
Start: 2024-02-03 — End: 2024-02-14

## 2024-02-03 MED ORDER — HYDROXYZINE HCL 25 MG PO TABS
25.0000 mg | ORAL_TABLET | Freq: Three times a day (TID) | ORAL | Status: DC | PRN
  Administered 2024-02-03: 25 mg via ORAL
  Filled 2024-02-03: qty 1

## 2024-02-03 MED ORDER — CARVEDILOL 6.25 MG PO TABS: 12.5000 mg | ORAL_TABLET | Freq: Two times a day (BID) | ORAL | 13 refills | Status: DC

## 2024-02-03 MED ORDER — HEPARIN NA (PORK) LOCK FLSH PF 10 UNIT/ML IV SOLN
10.0000 [IU] | Freq: Once | INTRAVENOUS | Status: AC
  Administered 2024-02-03: 30 [IU] via INTRAVENOUS
  Filled 2024-02-03: qty 5

## 2024-02-03 NOTE — Discharge Summary (Signed)
 Physician Discharge Summary   Patient: [name] [name] MRN: [id number] DOB: 1955-07-15  Admit date:     01/29/2024  Discharge date: 02/03/24  Discharge Physician: Drue ONEIDA Potter   PCP: Manya Toribio SQUIBB, PA   Recommendations at discharge:  Follow-up with cardiology as well as nephrology  Discharge Diagnoses:  Acute on chronic CHF ref complicated by CKDIV  with fluid overload with acute hypoxic respiratory failure. CKDIV Acute on chronic anemia CAD s/p NSTEMI Hx of VTE  Hx of Lung CA s/p LLL  HLD HTN DMII Depression    Hospital Course: [name] [name] is a 68 y.o. female with medical history significant of CAD  s/p NSTEMI CTO RCA  with good collaterals medical management,DMII, VTE, Lung CA IIIA s/p LLL lobectomy, CKDIV not on HD as of yet has fistula,  hx of Cocaine abuse, Depression, Hfref, HLD ,HTN , who presents to ED with two weeks of progressive sob worse over the last 3-4 days before presentation.  Patient was subsequently admitted for acute on chronic CHF exacerbation.  Required IV Lasix  drip and later transitioned to oral diuresis by nephrology.  Was seen by cardiologist as well as nephrologist and have now been cleared for discharge today and will follow-up as an outpatient.   Consultants: Cardiology, nephrology Procedures performed: None Disposition: Home Diet recommendation:  Cardiac and Carb modified diet DISCHARGE MEDICATION: Allergies as of 02/03/2024       Reactions   Lisinopril Swelling, Other (See Comments)   Recurrent AKI and hyperkalemia when initiation attempted. Other Reaction(s): Facial edema        Medication List     STOP taking these medications    cephALEXin  500 MG capsule Commonly known as: KEFLEX    diphenhydrAMINE  25 MG tablet Commonly known as: BENADRYL    ezetimibe 10 MG tablet Commonly known as: ZETIA   NIFEdipine 30 MG 24 hr tablet Commonly known as: PROCARDIA-XL/NIFEDICAL-XL       TAKE these medications    Accu-Chek Guide  Test test strip Generic drug: glucose blood 3 (three) times daily.   aspirin  EC 81 MG tablet Take 81 mg by mouth in the morning. Swallow whole.   atorvastatin  80 MG tablet Commonly known as: LIPITOR  Take 1 tablet by mouth daily.   calcium  acetate 667 MG capsule Commonly known as: PHOSLO Take 667 mg by mouth.   carboxymethylcellulose 0.5 % Soln Commonly known as: REFRESH PLUS Place 1 drop into both eyes in the morning and at bedtime.   CareFine Pen Needles 32G X 4 MM Misc Generic drug: Insulin  Pen Needle CAREFINE PEN NEEDLES 32G X 4 MM   carvedilol  6.25 MG tablet Commonly known as: COREG  Take 2 tablets (12.5 mg total) by mouth 2 (two) times daily with a meal. What changed: how much to take   Dexcom G7 Receiver Devi Use as instructed with G7 sensors   Dexcom G7 Sensor Misc Dispense DexCom G7 sensors; Use 1 sensor q 10 days   DULoxetine  20 MG capsule Commonly known as: CYMBALTA  TAKE 2 CAPSULES (40 MG TOTAL) BY MOUTH 2 (TWO) TIMES DAILY.   Glucagon 3 MG/DOSE Powd Use 1 spray in 1 nostril for severe hypoglycemia, as per package instructions   HumaLOG  KwikPen 100 UNIT/ML KwikPen Generic drug: insulin  lispro Inject 8 Units into the skin 3 (three) times daily after meals. What changed:  how much to take additional instructions   hydrALAZINE  100 MG tablet Commonly known as: APRESOLINE  Take 100 mg by mouth 3 (three) times daily.  isosorbide  dinitrate 30 MG tablet Commonly known as: ISORDIL  TAKE 1 TABLET BY MOUTH THREE TIMES A DAY   ketoconazole  2 % cream Commonly known as: NIZORAL  APPLY TO FEET TOPICALLY IN THE MORNING AND AT BED DAILY   Lantus  SoloStar 100 UNIT/ML Solostar Pen Generic drug: insulin  glargine Inject 4 Units into the skin at bedtime.   torsemide  20 MG tablet Commonly known as: DEMADEX  Take 4 tablets (80 mg total) by mouth daily. Take 4 20 mg tabs = 80 mg   traMADol  50 MG tablet Commonly known as: Ultram  Take 1 tablet (50 mg total) by mouth  every 6 (six) hours as needed.   Vitamin D (Ergocalciferol) 1.25 MG (50000 UNIT) Caps capsule Commonly known as: DRISDOL Take 50,000 Units by mouth.        Follow-up Information     [facility]. Go on 02/05/2024.   Specialty: Cardiology Why: Hospital Follow-Up 02/05/2024  Please bring all medications to follow-up appointment [facility], [address] Free Valet Parking at the door Contact information: [address] [place]  [address] [phone]        Douglas Rule, MD. Call.   Specialty: Nephrology Contact information: [address] [place] [address] [phone]                Discharge Exam: Filed Weights   02/01/24 0500 02/02/24 0500 02/03/24 0500  Weight: 56.7 kg 60.1 kg 56.4 kg   Neck: normal, supple, no masses, no thyromegaly Respiratory: Fine crackles noted at the lung bases bilaterally Cardiovascular: Regular rate and rhythm, no murmurs / rubs / gallops. +extremity edema., + sacral edema Abdomen: no tenderness, no masses palpated. No hepatosplenomegaly. Bowel sounds positive.  Musculoskeletal: no clubbing / cyanosis. No joint deformity upper and lower extremities. Good ROM, no contractures. Normal muscle tone.  Right bka Skin: no rashes, lesions, ulcers. No induration Neurologic: CN grossly intact. Sensation intact, Strength 5/5 in all 4.  Psychiatric: Normal judgment and insight. Alert and oriented x 3. Normal mood.     Condition at discharge: good  The results of significant diagnostics from this hospitalization (including imaging, microbiology, ancillary and laboratory) are listed below for reference.   Imaging Studies: US  RENAL Result Date: 01/30/2024 CLINICAL DATA:  Acute kidney failure. EXAM: RENAL / URINARY TRACT ULTRASOUND COMPLETE COMPARISON:  August 24, 2021 FINDINGS: Right Kidney: Renal measurements: 9.5 cm x 3.8 cm x  5.0 cm = volume: 95.5 mL. Diffusely increased echogenicity of the renal parenchyma is noted. No mass or hydronephrosis visualized. Left Kidney: Renal measurements: 8.3 cm x 4.6 cm x 4.4 cm = volume: 87.6 mL. Diffusely increased echogenicity of the renal parenchyma is noted. A 2.3 cm x 2.1 cm x 2.4 cm simple cyst is seen within the lower pole of the left kidney. No hydronephrosis is visualized. Bladder: Appears normal for degree of bladder distention. Other: None. IMPRESSION: 1. Bilateral echogenic kidneys which may represent sequelae associated with medical renal disease. 2. Simple left renal cysts. Electronically Signed   By: Suzen Dials M.D.   On: 01/30/2024 15:45   DG Chest 2 View Result Date: 01/29/2024 EXAM: CHEST - 2 VIEW COMPARISON:  06/01/2022 FINDINGS: Port in the anterior chest wall with tip in distal SVC. Stable enlarged cardiac silhouette. There is bilateral pleural effusion. LEFT basilar atelectasis. No pneumothorax. No overt pulmonary edema. No acute osseous abnormality. IMPRESSION: 1. Bilateral pleural effusions and LEFT basilar atelectasis. 2. No overt  pulmonary edema. Electronically Signed   By: Jackquline Boxer M.D.   On: 01/29/2024 15:17   US  Venous Img Upper Uni Left Result Date: 01/20/2024 EXAM: US  Duplex Left Upper Extremity Veins. TECHNIQUE: Real-time ultrasound scan of the veins of the left upper extremity with color Doppler flow, spectral waveform analysis and compression. COMPARISON: None. CLINICAL HISTORY: Arm swelling. FINDINGS: SUPERFICIAL VEINS: The cephalic is compressible, and demonstrates normal color Doppler flow. The facility vein is not positively identified. DEEP VEINS: The internal jugular, subclavian, axillary and brachial veins are compressible, and demonstrate normal color Doppler flow. Patent axillary to brachial arteriovenous graft in the left upper arm. SOFT TISSUES: Complex fluid collection in the antecubital fossa measuring 3.9x1.7 cm. IMPRESSION: 1. No deep  venous thrombosis in the left upper extremity. 2. Complex fluid collection in the antecubital fossa adjacent to the graft measuring 3.9 x 1.7 cm. This is favored to represent a chronic hematoma. Clinical correlation for superimposed infection recommended. Electronically signed by: Norman Gatlin MD 01/20/2024 07:20 PM EDT RP Workstation: [id number]    Microbiology: Results for orders placed or performed during the hospital encounter of 01/29/24  C Difficile Quick Screen w PCR reflex     Status: None   Collection Time: 01/31/24  1:38 PM   Specimen: STOOL  Result Value Ref Range Status   C Diff antigen NEGATIVE NEGATIVE Final   C Diff toxin NEGATIVE NEGATIVE Final   C Diff interpretation No C. difficile detected.  Final    Comment: Performed at [facility], [address] [place], [place] [address]    Labs: CBC: Recent Labs  Lab 01/29/24 1422 01/29/24 2357 01/31/24 0605 02/01/24 0427 02/02/24 0800  WBC 5.3 4.6 4.4 4.8 4.3  NEUTROABS  --   --  2.9 3.1 2.8  HGB 7.4* 8.2* 7.5* 7.3* 8.6*  HCT 24.6* 28.1* 24.7* 23.1* 27.6*  MCV 99.2 101.4* 96.5 95.1 94.8  PLT 252 242 255 245 229   Basic Metabolic Panel: Recent Labs  Lab 01/29/24 2357 01/31/24 0605 02/01/24 0427 02/02/24 0800 02/03/24 0834  NA 142 138 136 133* 134*  K 3.2* 3.3* 3.9 4.4 4.5  CL 104 100 103 100 100  CO2 26 26 23 25 24   GLUCOSE 126* 186* 270* 194* 167*  BUN 57* 59* 65* 67* 70*  CREATININE 3.52* 3.60* 3.73* 3.59* 3.34*  CALCIUM  7.4* 7.9* 8.0* 8.0* 8.1*   Liver Function Tests: Recent Labs  Lab 01/29/24 1422 01/29/24 2357  AST 39 28  ALT 19 17  ALKPHOS 106 109  BILITOT 0.6 0.6  PROT 5.6* 5.5*  ALBUMIN 2.7* 2.6*   CBG: Recent Labs  Lab 02/02/24 1542 02/02/24 2217 02/03/24 0505 02/03/24 0831 02/03/24 1206  GLUCAP 283* 249* 157* 192* 271*    Discharge time spent:  40 minutes.  Signed: Drue ONEIDA Potter, MD [facility] 02/03/2024

## 2024-02-03 NOTE — Plan of Care (Signed)
  Problem: Education: Goal: Ability to describe self-care measures that may prevent or decrease complications (Diabetes Survival Skills Education) will improve Outcome: Adequate for Discharge Goal: Individualized Educational Video(s) Outcome: Adequate for Discharge   Problem: Coping: Goal: Ability to adjust to condition or change in health will improve Outcome: Adequate for Discharge   Problem: Fluid Volume: Goal: Ability to maintain a balanced intake and output will improve Outcome: Adequate for Discharge   Problem: Health Behavior/Discharge Planning: Goal: Ability to identify and utilize available resources and services will improve Outcome: Adequate for Discharge Goal: Ability to manage health-related needs will improve Outcome: Adequate for Discharge   Problem: Metabolic: Goal: Ability to maintain appropriate glucose levels will improve Outcome: Adequate for Discharge   Problem: Nutritional: Goal: Maintenance of adequate nutrition will improve Outcome: Adequate for Discharge Goal: Progress toward achieving an optimal weight will improve Outcome: Adequate for Discharge   Problem: Skin Integrity: Goal: Risk for impaired skin integrity will decrease Outcome: Adequate for Discharge   Problem: Tissue Perfusion: Goal: Adequacy of tissue perfusion will improve Outcome: Adequate for Discharge   Problem: Education: Goal: Knowledge of General Education information will improve Description: Including pain rating scale, medication(s)/side effects and non-pharmacologic comfort measures Outcome: Adequate for Discharge   Problem: Health Behavior/Discharge Planning: Goal: Ability to manage health-related needs will improve Outcome: Adequate for Discharge   Problem: Clinical Measurements: Goal: Ability to maintain clinical measurements within normal limits will improve Outcome: Adequate for Discharge Goal: Will remain free from infection Outcome: Adequate for Discharge Goal:  Diagnostic test results will improve Outcome: Adequate for Discharge Goal: Respiratory complications will improve Outcome: Adequate for Discharge Goal: Cardiovascular complication will be avoided Outcome: Adequate for Discharge   Problem: Activity: Goal: Risk for activity intolerance will decrease Outcome: Adequate for Discharge   Problem: Nutrition: Goal: Adequate nutrition will be maintained Outcome: Adequate for Discharge   Problem: Coping: Goal: Level of anxiety will decrease Outcome: Adequate for Discharge   Problem: Elimination: Goal: Will not experience complications related to bowel motility Outcome: Adequate for Discharge Goal: Will not experience complications related to urinary retention Outcome: Adequate for Discharge   Problem: Pain Managment: Goal: General experience of comfort will improve and/or be controlled Outcome: Adequate for Discharge   Problem: Safety: Goal: Ability to remain free from injury will improve Outcome: Adequate for Discharge   Problem: Skin Integrity: Goal: Risk for impaired skin integrity will decrease Outcome: Adequate for Discharge   Problem: Education: Goal: Ability to demonstrate management of disease process will improve Outcome: Adequate for Discharge Goal: Ability to verbalize understanding of medication therapies will improve Outcome: Adequate for Discharge Goal: Individualized Educational Video(s) Outcome: Adequate for Discharge   Problem: Activity: Goal: Capacity to carry out activities will improve Outcome: Adequate for Discharge   Problem: Cardiac: Goal: Ability to achieve and maintain adequate cardiopulmonary perfusion will improve Outcome: Adequate for Discharge

## 2024-02-03 NOTE — Plan of Care (Signed)
   Problem: Education: Goal: Knowledge of General Education information will improve Description Including pain rating scale, medication(s)/side effects and non-pharmacologic comfort measures Outcome: Progressing   Problem: Health Behavior/Discharge Planning: Goal: Ability to manage health-related needs will improve Outcome: Progressing

## 2024-02-03 NOTE — Progress Notes (Signed)
 [facility]  PROGRESS NOTE   Subjective:   Patient seen at bedside.  Denies any chest pain or shortness of breath.  She does not want to go to the same place where she was living before.  Objective:  Vital signs: Blood pressure (!) 122/57, pulse 63, temperature 98 F (36.7 C), resp. rate 20, height 5' 5 (1.651 m), weight 56.4 kg, SpO2 100%.  Intake/Output Summary (Last 24 hours) at 02/03/2024 1242 Last data filed at 02/03/2024 0923 Gross per 24 hour  Intake 740 ml  Output --  Net 740 ml   Filed Weights   02/01/24 0500 02/02/24 0500 02/03/24 0500  Weight: 56.7 kg 60.1 kg 56.4 kg     Physical Exam: General:  No acute distress  Head:  Normocephalic, atraumatic. Moist oral mucosal membranes  Eyes:  Anicteric  Neck:  Supple  Lungs:   Clear to auscultation, normal effort  Heart:  S1S2 no rubs  Abdomen:   Soft, nontender, bowel sounds present  Extremities:  peripheral edema.  Neurologic:  Awake, alert, following commands  Skin:  No lesions  Access:     Basic Metabolic Panel: Recent Labs  Lab 01/29/24 2357 01/31/24 0605 02/01/24 0427 02/02/24 0800 02/03/24 0834  NA 142 138 136 133* 134*  K 3.2* 3.3* 3.9 4.4 4.5  CL 104 100 103 100 100  CO2 26 26 23 25 24   GLUCOSE 126* 186* 270* 194* 167*  BUN 57* 59* 65* 67* 70*  CREATININE 3.52* 3.60* 3.73* 3.59* 3.34*  CALCIUM  7.4* 7.9* 8.0* 8.0* 8.1*   GFR: Estimated Creatinine Clearance: 15 mL/min (A) (by C-G formula based on SCr of 3.34 mg/dL (H)).  Liver Function Tests: Recent Labs  Lab 01/29/24 1422 01/29/24 2357  AST 39 28  ALT 19 17  ALKPHOS 106 109  BILITOT 0.6 0.6  PROT 5.6* 5.5*  ALBUMIN 2.7* 2.6*   Recent Labs  Lab 01/29/24 1422  LIPASE 49   No results for input(s): AMMONIA in the last 168 hours.  CBC: Recent Labs  Lab 01/29/24 1422 01/29/24 2357 01/31/24 0605 02/01/24 0427 02/02/24 0800  WBC 5.3 4.6 4.4 4.8 4.3  NEUTROABS  --   --  2.9 3.1 2.8  HGB 7.4* 8.2* 7.5* 7.3* 8.6*   HCT 24.6* 28.1* 24.7* 23.1* 27.6*  MCV 99.2 101.4* 96.5 95.1 94.8  PLT 252 242 255 245 229     HbA1C: Hemoglobin A1C  Date/Time Value Ref Range Status  11/29/2023 08:58 AM 6.4 (A) 4.0 - 5.6 % Final  06/09/2022 12:00 AM 7.4  Final  01/25/2021 12:00 AM 7.8  Final   Hgb A1c MFr Bld  Date/Time Value Ref Range Status  02/01/2024 04:27 AM 7.3 (H) 4.8 - 5.6 % Final    Comment:    (NOTE)         Prediabetes: 5.7 - 6.4         Diabetes: >6.4         Glycemic control for adults with diabetes: <7.0   08/25/2021 11:54 AM 7.6 (H) 4.8 - 5.6 % Final    Comment:             Prediabetes: 5.7 - 6.4          Diabetes: >6.4          Glycemic control for adults with diabetes: <7.0     Urinalysis: No results for input(s): COLORURINE, LABSPEC, PHURINE, GLUCOSEU, HGBUR, BILIRUBINUR, KETONESUR, PROTEINUR, UROBILINOGEN, NITRITE, LEUKOCYTESUR in the last 72 hours.  Invalid  input(s): APPERANCEUR    Imaging: No results found.   Medications:     sodium chloride    Intravenous Once   atorvastatin   80 mg Oral Daily   carvedilol   25 mg Oral BID WC   Chlorhexidine  Gluconate Cloth  6 each Topical Daily   DULoxetine   40 mg Oral Daily   feeding supplement (NEPRO CARB STEADY)  237 mL Oral TID BM   heparin   5,000 Units Subcutaneous Q8H   hydrALAZINE   100 mg Oral TID   insulin  aspart  0-6 Units Subcutaneous TID WC   insulin  glargine-yfgn  4 Units Subcutaneous Daily   isosorbide  dinitrate  30 mg Oral TID   sodium chloride  flush  10-40 mL Intracatheter Q12H   torsemide   80 mg Oral Daily    Assessment/ Plan:     68 year old female with history of diabetes, peripheral vascular disease, hypertension, coronary artery disease, history of cocaine abuse, history of lung cancer status post lobectomy on chronic kidney disease now admitted with history of shortness of breath and dyspnea on exertion and found to have congestive heart failure.   #1: Chronic kidney disease: Patient  has a creatinine of about 3.4 with a GFR of less than 15 cc/min.  Patient recently had a left AV graft placed.  Continue torsemide  at 80 mg daily.   #2: Hypertension: Will continue torsemide , hydralazine  and carvedilol .  She is advised to stay on 2 g salt restricted diet.   #3: Anemia: Anemia secondary to chronic kidney disease versus iron  deficiency.  She will need Epogen and oral iron .  Will defer this to heme-onc secondary to history of lung cancer.   #4: Secondary hyperparathyroidism: Will continue vitamin D.   Stable from renal standpoint. Labs and medications reviewed. Will continue to follow along with you.   LOS: 5 Liahna Brickner, MD [facility] 8/17/202512:42 PM

## 2024-02-03 NOTE — Progress Notes (Signed)
 Progress Note  Patient Name: [name] [name] Date of Encounter: 02/03/2024  Primary Cardiologist: [facility]  Subjective   Episode of pruritus overnight without symptoms of angioedema or mucus membrane lesions. Pruritus resolved. No chest pain or dyspnea. Ins and outs incomplete. BP improving. Renal function stable.  Inpatient Medications    Scheduled Meds:  sodium chloride    Intravenous Once   atorvastatin   80 mg Oral Daily   carvedilol   25 mg Oral BID WC   Chlorhexidine  Gluconate Cloth  6 each Topical Daily   DULoxetine   40 mg Oral Daily   feeding supplement (NEPRO CARB STEADY)  237 mL Oral TID BM   heparin   5,000 Units Subcutaneous Q8H   hydrALAZINE   100 mg Oral TID   insulin  aspart  0-6 Units Subcutaneous TID WC   insulin  glargine-yfgn  4 Units Subcutaneous Daily   isosorbide  dinitrate  30 mg Oral TID   sodium chloride  flush  10-40 mL Intracatheter Q12H   torsemide   80 mg Oral Daily   Continuous Infusions:   PRN Meds: acetaminophen  **OR** acetaminophen , albuterol , guaiFENesin -dextromethorphan , hydrALAZINE , hydrOXYzine , ondansetron  **OR** ondansetron  (ZOFRAN ) IV, sodium chloride  flush   Vital Signs    Vitals:   02/03/24 0503 02/03/24 0831 02/03/24 0921 02/03/24 0922  BP: (!) 148/66 (!) 146/67 (!) 146/67 (!) 146/67  Pulse: 64 64 64   Resp: 20 20    Temp: 98.2 F (36.8 C) 98.2 F (36.8 C)    TempSrc:      SpO2: 100% 100%    Weight:      Height:        Intake/Output Summary (Last 24 hours) at 02/03/2024 0923 Last data filed at 02/02/2024 1700 Gross per 24 hour  Intake 480 ml  Output --  Net 480 ml   Filed Weights   02/01/24 0500 02/02/24 0500 02/03/24 0500  Weight: 56.7 kg 60.1 kg 56.4 kg    Telemetry    Sinus rhythm, 60s to 70s bpm - Personally Reviewed  ECG    No new tracings - Personally Reviewed  Physical Exam   GEN: No acute distress.   Neck: No JVD. Cardiac: RRR, no murmurs, rubs, or gallops.  Respiratory: Clear to auscultation bilaterally.   GI: Soft, nontender, non-distended.   MS: No edema; status post right BKA. Neuro:  Alert and oriented x 3; Nonfocal.  Psych: Normal affect.  Labs    Chemistry Recent Labs  Lab 01/29/24 1422 01/29/24 2357 01/31/24 0605 02/01/24 0427 02/02/24 0800  NA 141 142 138 136 133*  K 3.3* 3.2* 3.3* 3.9 4.4  CL 103 104 100 103 100  CO2 24 26 26 23 25   GLUCOSE 196* 126* 186* 270* 194*  BUN 58* 57* 59* 65* 67*  CREATININE 3.65* 3.52* 3.60* 3.73* 3.59*  CALCIUM  7.7* 7.4* 7.9* 8.0* 8.0*  PROT 5.6* 5.5*  --   --   --   ALBUMIN 2.7* 2.6*  --   --   --   AST 39 28  --   --   --   ALT 19 17  --   --   --   ALKPHOS 106 109  --   --   --   BILITOT 0.6 0.6  --   --   --   GFRNONAA 13* 14* 13* 13* 13*  ANIONGAP 14 12 12 10 8      Hematology Recent Labs  Lab 01/31/24 0605 02/01/24 0427 02/02/24 0800  WBC 4.4 4.8 4.3  RBC 2.56* 2.43* 2.91*  HGB 7.5* 7.3* 8.6*  HCT 24.7* 23.1* 27.6*  MCV 96.5 95.1 94.8  MCH 29.3 30.0 29.6  MCHC 30.4 31.6 31.2  RDW 14.4 14.6 15.8*  PLT 255 245 229    Cardiac EnzymesNo results for input(s): TROPONINI in the last 168 hours. No results for input(s): TROPIPOC in the last 168 hours.   BNP Recent Labs  Lab 01/29/24 1422  BNP 3,838.7*     DDimer No results for input(s): DDIMER in the last 168 hours.   Radiology    US  RENAL Result Date: 01/30/2024 IMPRESSION: 1. Bilateral echogenic kidneys which may represent sequelae associated with medical renal disease. 2. Simple left renal cysts. Electronically Signed   By: Suzen Dials M.D.   On: 01/30/2024 15:45   DG Chest 2 View Result Date: 01/29/2024 IMPRESSION: 1. Bilateral pleural effusions and LEFT basilar atelectasis. 2. No overt pulmonary edema. Electronically Signed   By: Jackquline Boxer M.D.   On: 01/29/2024 15:17    Cardiac Studies   12/31/2023 Echo complete   1. The left ventricle is normal in size with mildly increased wall  thickness.   2. The left ventricular systolic function  is borderline, LVEF is visually  estimated at 50-55%.    3. There is grade II diastolic dysfunction (elevated filling pressure).    4. The right ventricle is normal in size, with normal systolic function.    5. There is mild mitral valve regurgitation.   Patient Profile     68 y.o. 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  Assessment & Plan    1.  Acute on chronic HFpEF with history of HFimpEF: - Likely exacerbated by progressive anemia, underlying renal dysfunction, and poorly controlled hypertension - Responded well to Lasix  drip, now transitioned to torsemide  80 mg daily by nephrology  - I/O incomplete  - Remains on carvedilol , Imdur, and hydralazine  - Not on MRA with underlying renal dysfunction  2.  CAD involving native coronary arteries with elevated high-sensitivity troponin: - Minimally elevated and flat trending high-sensitivity troponin, not consistent with ACS and likely secondary to supply/demand ischemia in the setting of underlying renal dysfunction, volume overload, and progressive anemia - Recent echo showed low normal LV systolic function - No indication for heparin  drip with progressive anemia - No plans for inpatient ischemic evaluation at this time - Not on aspirin  with underlying anemia - Consider resuming PTA atorvastatin   3.  HTN: - Blood pressure improving into the 140s mmHg - Continue titrated dose of carvedilol  to 25 mg twice daily with continuation of hydralazine  100 mg 3 times daily and Isordil  30 mg 3 times daily - Trend BP following morning medications  4.  Acute on CKD stage IV with anemia of  chronic disease: - Renal function pending this morning - Hgb improved at 8.6 on last check, denies bleeding - Contributing to her presentation  - Appreciate nephrology assistance  5.  History of medication nonadherence: - Reports compliance  6. Pruritus: - Resolved - Monitor with multiple medication changes this admission  - No symptoms of angioedema or mucus membrane lesions       For questions or updates, please contact [facility] Please consult [email or web address] for contact info under Cardiology/STEMI.    Signed, Bernardino Bring, PA-C [facility] Pager: [phone] 02/03/2024, 9:23 AM

## 2024-02-03 NOTE — Progress Notes (Signed)
 Pt given dc/rx instructions. Pt advised to return to the ED as needed/worse. Pt voices understanding. Pt getting dressed.

## 2024-02-04 ENCOUNTER — Telehealth: Payer: Self-pay | Admitting: Family

## 2024-02-04 NOTE — Progress Notes (Deleted)
 Advanced Heart Failure Clinic Note   Referring Physician: 08/25 admission PCP: Manya Toribio SQUIBB, PA Cardiologist: None   Chief Complaint:    HPI:  [name] [name] is a 68 y/o female with a history of anemia, CKD w/ fistula 07/25, CAD s/p NSTEMI, VTE, lung cancer s/p LLL, HTN, hyperlipidemia, T2DM, right BKA 2021, CVA, PE, depression, tobacco/ cocaine use and HFrEF (12/24).   Echo 05/30/23: EF 25%, G1DD, severe RV dysfunction  Admitted 06/21/23 due to being volume overloaded. Recent admission prior to that from 12/9 - 06/18/23 (at that time new HF diagnosis, suspected mixed etiology) then did not pick up meds at DC. This January admission pBNP was 89k (131k prior). IV diuresed. BL effusions on CXR. Resumed home GDMT/HTN agents. DC at 115 lbs.   Admitted again a few days later 06/28/23 with hypoglycemia. Incorrect home insulin  administration, which was corrected in hospital. One dose of IV lasix  in hospital. AKI at admission which was improving at DC.   Admitted 07/10/23 with SOB/ cough. Had stopped taking insulin  due to her concern about hypoglycemia although glucose levels have been 400-500's. Cr 4.7 (from 3.8), BUN 76, glucose 324, Hgb 8.6 (around baseline), lipase 110, BHB <0.18, VBG 7.33/43, lactate 1.4, pro-BNP 4321. CXR stable. Given IVF with improvement of renal function. Endocrinology consulted.   Admitted 10/01/23 with HF exacerbation after running out of diuretics 4 days prior. + UDS for cocaine. proBNP was over 32,000 and troponins were 88 when she came in and peaked at 448 but then down-trended quickly. Did not have EKG findings of new ischemia. Cardiology consulted. IV diuresed and weaned to room air.    Echo 12/31/23: EF 50-55%, G2DD, mild MR, normal RV   Was in the ED 01/20/24 with pain and swelling of the left arm after recent AV graft placement. Surgical graft placement on January 10, 2024 by vascular surgery Dr. Marea. Patient reports since then she has had gradually worsening pain and  swelling of the area. Ultrasound negative, vascular consulted. Antibiotics given.   Admitted 01/29/24 with two weeks of progressive sob worse over the last 3-4 days before presentation.Received lasix  drip with transition to oral diuretics. Cardiology and nephrology consults obtained.   She presents today for her initial HF visit with a chief complaint of    Review of Systems: [y] = yes, [ ]  = no   General: Weight gain [ ] ; Weight loss [ ] ; Anorexia [ ] ; Fatigue [ ] ; Fever [ ] ; Chills [ ] ; Weakness [ ]   Cardiac: Chest pain/pressure [ ] ; Resting SOB [ ] ; Exertional SOB [ ] ; Orthopnea [ ] ; Pedal Edema [ ] ; Palpitations [ ] ; Syncope [ ] ; Presyncope [ ] ; Paroxysmal nocturnal dyspnea[ ]   Pulmonary: Cough [ ] ; Wheezing[ ] ; Hemoptysis[ ] ; Sputum [ ] ; Snoring [ ]   GI: Vomiting[ ] ; Dysphagia[ ] ; Melena[ ] ; Hematochezia [ ] ; Heartburn[ ] ; Abdominal pain [ ] ; Constipation [ ] ; Diarrhea [ ] ; BRBPR [ ]   GU: Hematuria[ ] ; Dysuria [ ] ; Nocturia[ ]   Vascular: Pain in legs with walking [ ] ; Pain in feet with lying flat [ ] ; Non-healing sores [ ] ; Stroke [ ] ; TIA [ ] ; Slurred speech [ ] ;  Neuro: Headaches[ ] ; Vertigo[ ] ; Seizures[ ] ; Paresthesias[ ] ;Blurred vision [ ] ; Diplopia [ ] ; Vision changes [ ]   Ortho/Skin: Arthritis [ ] ; Joint pain [ ] ; Muscle pain [ ] ; Joint swelling [ ] ; Back Pain [ ] ; Rash [ ]   Psych: Depression[ ] ; Anxiety[ ]   Heme: Bleeding problems [ ] ;  Clotting disorders [ ] ; Anemia [ ]   Endocrine: Diabetes [ ] ; Thyroid  dysfunction[ ]    Past Medical History:  Diagnosis Date   Adenocarcinoma of left lung (HCC)    a.) stage IIIA (pT1c, pN2, cM0) --> s/p LLL lobectomy + 4 cycles of carbo/taxol  + 1 cycle atezolizumab  (discontinued for grade IV toxicity)   Anemia of chronic renal failure    Cerebral microvascular disease    CKD (chronic kidney disease), stage V (HCC)    Cocaine abuse (HCC)    Coronary artery disease    a.) LHC (NSTEMI) 05/30/2010: 10% pLCxm 20% pRCA-1, 100% pRCA-2 - med mgmt;  b.) LHC 02/24/2020: CTA pRCA with good L-R collaterals, 50% mLAD, 80% OM1 - med mgmt   Depression    Dyspnea    Elevated LEFT hemidiaphragm    HFrEF (heart failure with reduced ejection fraction) (HCC) 02/16/2020   a.) Dx'd in setting of acute DVT/PE   Hyperlipidemia    Hypertension    Long-term use of aspirin  therapy    NSTEMI (non-ST elevated myocardial infarction) (HCC) 05/28/2010   a.) troponins were trended: 1.70 --> 3.30 --> 4.90 ng/mL; b.) LHC 05/30/2010: 10% pLCx, 20% pRCA-1, 100% pRCA-2 (chronic) with good collaterals - med mgmt   Peripheral artery disease (HCC)    a.) 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   Systolic ejection murmur    T2DM (type 2 diabetes mellitus) (HCC)    Venous thromboembolism (VTE) 02/15/2020   a.) 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; CTA (+) for multiple RIGHT segmental/subsegment pulmonary emboli    Current Outpatient Medications  Medication Sig Dispense Refill   ACCU-CHEK GUIDE TEST test strip 3 (three) times daily.     aspirin  EC 81 MG tablet Take 81 mg by mouth in the morning. Swallow whole.     atorvastatin  (LIPITOR ) 80 MG tablet Take 1 tablet by mouth daily.     calcium  acetate (PHOSLO) 667 MG capsule Take 667 mg by mouth. (Patient not taking: Reported on 12/06/2023)     carboxymethylcellulose (REFRESH PLUS) 0.5 % SOLN Place 1 drop into both eyes in the morning and at bedtime.     CAREFINE PEN NEEDLES 32G X 4 MM MISC CAREFINE PEN NEEDLES 32G X 4 MM     carvedilol  (COREG ) 6.25 MG tablet Take 2 tablets (12.5 mg total) by mouth 2 (two) times daily with a meal. 120 tablet 13   Continuous Glucose Receiver (DEXCOM G7 RECEIVER) DEVI Use as instructed with G7 sensors     Continuous Glucose Sensor (DEXCOM G7 SENSOR) MISC Dispense DexCom G7 sensors; Use 1 sensor q 10 days     DULoxetine  (CYMBALTA ) 20 MG capsule TAKE 2 CAPSULES (40 MG TOTAL) BY MOUTH 2 (TWO) TIMES DAILY. 360 capsule 0   Glucagon 3 MG/DOSE POWD Use 1 spray in 1 nostril for severe hypoglycemia, as per package  instructions     HUMALOG  KWIKPEN 100 UNIT/ML KwikPen Inject 8 Units into the skin 3 (three) times daily after meals. (Patient taking differently: Inject 4 Units into the skin 3 (three) times daily after meals. PER SLIDING SCALE) 15 mL 11   hydrALAZINE  (APRESOLINE ) 100 MG tablet Take 100 mg by mouth 3 (three) times daily.     isosorbide  dinitrate (ISORDIL ) 30 MG tablet TAKE 1 TABLET BY MOUTH THREE TIMES A DAY 270 tablet 1   ketoconazole  (NIZORAL ) 2 % cream APPLY TO FEET TOPICALLY IN THE MORNING AND AT BED DAILY 60 g 1   LANTUS  SOLOSTAR 100 UNIT/ML Solostar Pen Inject 4 Units  into the skin at bedtime.     torsemide  (DEMADEX ) 20 MG tablet Take 4 tablets (80 mg total) by mouth daily. Take 4 20 mg tabs = 80 mg 120 tablet 0   traMADol  (ULTRAM ) 50 MG tablet Take 1 tablet (50 mg total) by mouth every 6 (six) hours as needed. 20 tablet 0   Vitamin D, Ergocalciferol, (DRISDOL) 1.25 MG (50000 UNIT) CAPS capsule Take 50,000 Units by mouth.     No current facility-administered medications for this visit.   Facility-Administered Medications Ordered in Other Visits  Medication Dose Route Frequency Provider Last Rate Last Admin   heparin  lock flush 100 UNIT/ML injection            heparin  lock flush 100 unit/mL  500 Units Intravenous Once Yu, Zhou, MD        Allergies  Allergen Reactions   Lisinopril Swelling and Other (See Comments)    Recurrent AKI and hyperkalemia when initiation attempted. Other Reaction(s): Facial edema      Social History   Socioeconomic History   Marital status: Married    Spouse name: Not on file   Number of children: Not on file   Years of education: Not on file   Highest education level: Not on file  Occupational History   Not on file  Tobacco Use   Smoking status: Former    Current packs/day: 0.00    Types: Cigars, Cigarettes    Quit date: 01/17/2021    Years since quitting: 3.0   Smokeless tobacco: Never  Vaping Use   Vaping status: Never Used  Substance and  Sexual Activity   Alcohol  use: Never   Drug use: Not Currently   Sexual activity: Not Currently  Other Topics Concern   Not on file  Social History Narrative   Not on file   Social Drivers of Health   Financial Resource Strain: Low Risk  (06/29/2023)   Received from [facility]   Overall Financial Resource Strain (CARDIA)    Difficulty of Paying Living Expenses: Not hard at all  Food Insecurity: No Food Insecurity (01/30/2024)   Hunger Vital Sign    Worried About Running Out of Food in the Last Year: Never true    Ran Out of Food in the Last Year: Never true  Transportation Needs: No Transportation Needs (01/30/2024)   PRAPARE - Administrator, Civil Service (Medical): No    Lack of Transportation (Non-Medical): No  Physical Activity: Not on file  Stress: Stress Concern Present (12/15/2021)   [employer] of Occupational Health - Occupational Stress Questionnaire    Feeling of Stress : To some extent  Social Connections: Moderately Integrated (01/30/2024)   Social Connection and Isolation Panel    Frequency of Communication with Friends and Family: More than three times a week    Frequency of Social Gatherings with Friends and Family: Once a week    Attends Religious Services: More than 4 times per year    Active Member of [employer] or Organizations: Yes    Attends Banker Meetings: More than 4 times per year    Marital Status: Widowed  Intimate Partner Violence: Not At Risk (01/30/2024)   Humiliation, Afraid, Rape, and Kick questionnaire    Fear of Current or Ex-Partner: No    Emotionally Abused: No    Physically Abused: No    Sexually Abused: No      Family History  Problem Relation Age of Onset   Heart  disease Mother    Diabetes Mother    Heart disease Father    Diabetes Father        PHYSICAL EXAM: General:  Well appearing. No respiratory difficulty HEENT: normal Neck: supple. no JVD. Carotids 2+ bilat; no bruits. No  lymphadenopathy or thyromegaly appreciated. Cor: PMI nondisplaced. Regular rate & rhythm. No rubs, gallops or murmurs. Lungs: clear Abdomen: soft, nontender, nondistended. No hepatosplenomegaly. No bruits or masses. Good bowel sounds. Extremities: no cyanosis, clubbing, rash, edema Neuro: alert & oriented x 3, cranial nerves grossly intact. moves all 4 extremities w/o difficulty. Affect pleasant.  ECG:   ASSESSMENT & PLAN:  1: Chronic heart failure with preserved ejection fraction- - suspect due to  - NYHA [name] - euvolemic - weighing daily - Echo 05/30/23: EF 25%, G1DD, severe RV dysfunction - Echo 12/31/23: EF 50-55%, G2DD, mild MR, normal RV  - continue  - BNP 01/29/24 was 3838.7  2: HTN- - BP - saw PCP Vinie) - BMET 02/03/24 reviewed: sodium 134, potassium 4.5, creatinine 3.34 & GFR 15  3: T2DM- - A1c 02/01/24 was 7.3%  4: PVD- - right BKA 2021 - saw vascular  5: CKD 5- - fistula placed 07/25 for future dialysis  6: Hyperlipidemia- - LDL 08/31/23 was 122   [name] [name], [name] [name] 02/04/24

## 2024-02-04 NOTE — Telephone Encounter (Signed)
 Called to confirm/remind patient of their appointment at the [facility] on 02/05/24.   Appointment:   [] Confirmed  [x] Left mess   [] No answer/No voice mail  [] VM Full/unable to leave message  [] Phone not in service  Patient reminded to bring all medications and/or complete list.  Confirmed patient has transportation. Gave directions, instructed to utilize valet parking.

## 2024-02-05 ENCOUNTER — Telehealth: Payer: Self-pay

## 2024-02-05 ENCOUNTER — Telehealth: Payer: Self-pay | Admitting: Family

## 2024-02-05 ENCOUNTER — Encounter: Admitting: Family

## 2024-02-05 NOTE — Telephone Encounter (Signed)
 Lvm to return call to reschedule appt.

## 2024-02-05 NOTE — Telephone Encounter (Signed)
 Patient did not show for her initial Heart Failure Clinic appointment on 02/05/24.

## 2024-02-06 ENCOUNTER — Ambulatory Visit (INDEPENDENT_AMBULATORY_CARE_PROVIDER_SITE_OTHER): Admitting: Physician Assistant

## 2024-02-06 ENCOUNTER — Encounter: Payer: Self-pay | Admitting: Oncology

## 2024-02-06 ENCOUNTER — Encounter: Payer: Self-pay | Admitting: Physician Assistant

## 2024-02-06 VITALS — BP 160/76 | HR 69 | Temp 97.9°F | Ht 65.0 in | Wt 116.0 lb

## 2024-02-06 DIAGNOSIS — I10 Essential (primary) hypertension: Secondary | ICD-10-CM | POA: Diagnosis not present

## 2024-02-06 DIAGNOSIS — N185 Chronic kidney disease, stage 5: Secondary | ICD-10-CM

## 2024-02-06 DIAGNOSIS — Z794 Long term (current) use of insulin: Secondary | ICD-10-CM

## 2024-02-06 DIAGNOSIS — B351 Tinea unguium: Secondary | ICD-10-CM | POA: Diagnosis not present

## 2024-02-06 DIAGNOSIS — I5022 Chronic systolic (congestive) heart failure: Secondary | ICD-10-CM

## 2024-02-06 DIAGNOSIS — E440 Moderate protein-calorie malnutrition: Secondary | ICD-10-CM

## 2024-02-06 DIAGNOSIS — E1122 Type 2 diabetes mellitus with diabetic chronic kidney disease: Secondary | ICD-10-CM

## 2024-02-06 DIAGNOSIS — R296 Repeated falls: Secondary | ICD-10-CM | POA: Insufficient documentation

## 2024-02-06 NOTE — Assessment & Plan Note (Signed)
 Patient would like referral to podiatry

## 2024-02-06 NOTE — Assessment & Plan Note (Signed)
 Coordinated follow-up with heart failure clinic today, which has been arranged for next Thursday.  I have brought this to her attention and printed out upcoming specialist appointments on her AVS today.  I also helped her get back into her MyChart account, and wrote down her username and password for her.

## 2024-02-06 NOTE — Assessment & Plan Note (Signed)
 A1c 7.3% on 02/01/24 reflecting fair glycemic control.  Continue follow-up with endocrinology.

## 2024-02-06 NOTE — Progress Notes (Signed)
 Date:  02/06/2024   Name:  [name] [name]   DOB:  [birth year]   MRN:  [id number]   Chief Complaint: Medical Management of Chronic Issues and Hospitalization Follow-up (Was in the hospital last week, still feel tired no energy )  HPI [name] [name] presents for f/u on chronic conditions, medically complex. PMH significant for non-small cell left lung cancer s/p partial resection and chemo, DM2, CKD 5, PVD s/p right BKA, HTN, HLD, history of multiple MI, history of PE, history of tobacco use, history of cocaine use, ischemic cardiomyopathy, CHF.  Left axillary AV graft for dialysis created 01/10/24 by vascular Dr. Marea. Seen in ED 01/20/24 for patient concern of left arm swelling but u/s reassuring and Dr. Marea consulted with minimal concerns.   Also admitted 01/29/24-02/03/24 for dyspnea, dx'd with acute on chronic CHF with fluid overload and acute hypoxic resp failure. Reports taking all medications as prescribed. Still feels very tired.   Missed her cardiac rehab/HF appointment yesterday, needs to reschedule.   Within the next month, she will be seeing nephrology, vascular, and endocrinology for follow ups.    Medication list has been reviewed and updated.  Current Meds  Medication Sig   ACCU-CHEK GUIDE TEST test strip 3 (three) times daily.   aspirin  EC 81 MG tablet Take 81 mg by mouth in the morning. Swallow whole.   atorvastatin  (LIPITOR ) 80 MG tablet Take 1 tablet by mouth daily.   calcium  acetate (PHOSLO) 667 MG capsule Take 667 mg by mouth.   carboxymethylcellulose (REFRESH PLUS) 0.5 % SOLN Place 1 drop into both eyes in the morning and at bedtime.   CAREFINE PEN NEEDLES 32G X 4 MM MISC CAREFINE PEN NEEDLES 32G X 4 MM   carvedilol  (COREG ) 6.25 MG tablet Take 2 tablets (12.5 mg total) by mouth 2 (two) times daily with a meal.   Continuous Glucose Receiver (DEXCOM G7 RECEIVER) DEVI Use as instructed with G7 sensors   Continuous Glucose Sensor (DEXCOM G7 SENSOR) MISC Dispense DexCom G7 sensors;  Use 1 sensor q 10 days   DULoxetine  (CYMBALTA ) 20 MG capsule TAKE 2 CAPSULES (40 MG TOTAL) BY MOUTH 2 (TWO) TIMES DAILY.   Glucagon 3 MG/DOSE POWD Use 1 spray in 1 nostril for severe hypoglycemia, as per package instructions   HUMALOG  KWIKPEN 100 UNIT/ML KwikPen Inject 8 Units into the skin 3 (three) times daily after meals. (Patient taking differently: Inject 4 Units into the skin 3 (three) times daily after meals. PER SLIDING SCALE)   hydrALAZINE  (APRESOLINE ) 100 MG tablet Take 100 mg by mouth 3 (three) times daily.   isosorbide  dinitrate (ISORDIL ) 30 MG tablet TAKE 1 TABLET BY MOUTH THREE TIMES A DAY   ketoconazole  (NIZORAL ) 2 % cream APPLY TO FEET TOPICALLY IN THE MORNING AND AT BED DAILY   LANTUS  SOLOSTAR 100 UNIT/ML Solostar Pen Inject 4 Units into the skin at bedtime.   torsemide  (DEMADEX ) 20 MG tablet Take 4 tablets (80 mg total) by mouth daily. Take 4 20 mg tabs = 80 mg   traMADol  (ULTRAM ) 50 MG tablet Take 1 tablet (50 mg total) by mouth every 6 (six) hours as needed.   Vitamin D, Ergocalciferol, (DRISDOL) 1.25 MG (50000 UNIT) CAPS capsule Take 50,000 Units by mouth.     Review of Systems  Patient Active Problem List   Diagnosis Date Noted   Frequent falls 02/06/2024   Onychomycosis of left great toe 02/06/2024   Primary hypertension 02/02/2024   Coronary artery disease involving native  heart without angina pectoris 02/02/2024   Dyspnea 01/30/2024   Shortness of breath 01/30/2024   Acute on chronic diastolic CHF (congestive heart failure) (HCC) 01/30/2024   CHF (congestive heart failure) (HCC) 01/29/2024   Ischemic cardiomyopathy 11/29/2023   Microalbuminuric diabetic nephropathy (HCC) 11/29/2023   Chronic systolic heart failure (HCC) 11/29/2023   Aortic atherosclerosis (HCC) 11/29/2023   Emphysema/COPD (HCC) 11/29/2023   Osteopenia after menopause 11/29/2023   Systolic murmur 11/29/2023   Status post below-knee amputation of right lower extremity (HCC) 08/31/2023    Hypoglycemia 06/29/2023   GIB (gastrointestinal bleeding) 06/12/2023   Acute deep vein thrombosis (DVT) of right upper extremity (HCC) 06/02/2023   Nephrotic syndrome 05/30/2023   Syncope 05/28/2023   Sinus bradycardia 05/28/2023   Vitamin B12 deficiency 07/10/2022   Port-A-Cath in place 07/10/2022   Liver function test abnormality    Acute renal failure superimposed on stage 5 chronic kidney disease, not on chronic dialysis (HCC) 08/03/2021   Transaminitis 08/02/2021   Chemotherapy-induced nausea 07/28/2021   Macrocytic anemia 07/07/2021   Moderate protein-calorie malnutrition (HCC) 04/12/2021   Anemia due to stage 5 chronic kidney disease, not on chronic dialysis (HCC) 04/12/2021   Anemia of chronic disease 04/12/2021   Non-small cell cancer of left lung (HCC) 02/17/2021   Tinea pedis of both feet 02/10/2021   Chronic pain due to neoplasm 02/10/2021   Adenocarcinoma, lung, left (HCC) 01/19/2021   S/P Robotic Assisted Video Thoracoscopy with Left Lower Lobectomy Lung, Intercostal nerve block, lymph node dissection 01/17/2021   Non-small cell lung cancer (HCC) 12/30/2020   Depression 12/09/2020   Hyperlipidemia 12/09/2020   Malignant hypertension 12/09/2020   Heart failure with reduced ejection fraction (HCC) 02/16/2020   History of pulmonary embolism 02/15/2020   Iron  deficiency anemia 12/18/2019   Cocaine use 08/17/2019   Peripheral vascular disease (HCC) 08/17/2019   Tobacco use disorder 08/17/2019   Tobacco abuse, in remission 08/17/2019   Cervical dysplasia 04/02/2019   Neuropathy, peripheral 01/06/2019   Peripheral arterial occlusive disease (HCC) 11/26/2018   Crack cocaine use 11/16/2016   Nicotine dependence, uncomplicated 11/26/2014   Arteriosclerosis of coronary artery 06/06/2010   Type 2 diabetes mellitus with stage 5 chronic kidney disease not on chronic dialysis, with long-term current use of insulin  (HCC) 06/06/2010   History of MI (myocardial infarction)  06/05/2010    Allergies  Allergen Reactions   Lisinopril Swelling and Other (See Comments)    Recurrent AKI and hyperkalemia when initiation attempted. Other Reaction(s): Facial edema    Immunization History  Administered Date(s) Administered   Fluad Trivalent(High Dose 65+) 03/05/2023   Influenza, Mdck, Trivalent,PF 6+ MOS(egg free) 06/18/2023   Influenza,inj,Quad PF,6+ Mos 08/19/2018, 04/12/2021, 08/08/2022   Influenza,inj,quad, With Preservative 04/01/2015   Moderna Sars-Covid-2 Vaccination 09/11/2019, 10/19/2019, 11/18/2019, 06/02/2020   PNEUMOCOCCAL CONJUGATE-20 01/31/2024   Pneumococcal Polysaccharide-23 08/19/2018   Tdap 08/19/2018    Past Surgical History:  Procedure Laterality Date   BELOW KNEE LEG AMPUTATION Right 01/06/2020   INSERTION OF ARTERIOVENOUS (AV) ARTEGRAFT ARM Left 01/10/2024   Procedure: INSERTION, GRAFT, ARTERIOVENOUS, UPPER EXTREMITY;  Surgeon: [name] [name] Selinda RAMAN, MD;  Location: [facility];  Service: Vascular;  Laterality: Left;  BRACHIAL AXILLARY   IR IMAGING GUIDED PORT INSERTION  07/27/2021   LEFT HEART CATH AND CORONARY ANGIOGRAPHY Left    Procedure: LEFT HEART CATHETERIZATION AND CORONARY ANGIOGRAPHY; Location: [facility]; Surgeon: Gerhardt Mana and Zachary Prentice Car, MD   LEFT HEART CATH AND CORONARY ANGIOGRAPHY Left 05/30/2010   Procedure: LEFT HEART CATH AND  CORONARY ANGIOGRAPHY; Location: [facility]; Surgeon: Vinie Jude, MD   LEFT LOWER PULMONARY LOBECTOMY AND LYMPH NODE DISSECTION Left 02/06/2021   PORTA CATH INSERTION      Social History   Tobacco Use   Smoking status: Former    Current packs/day: 0.00    Types: Cigars, Cigarettes    Quit date: 01/17/2021    Years since quitting: 3.0   Smokeless tobacco: Never  Vaping Use   Vaping status: Never Used  Substance Use Topics   Alcohol  use: Never   Drug use: Not Currently    Family History  Problem Relation Age of Onset   Heart disease Mother    Diabetes Mother    Heart disease Father     Diabetes Father         02/06/2024    2:02 PM 11/29/2023    9:16 AM 07/09/2023    2:51 PM 03/05/2023    2:09 PM  GAD 7 : Generalized Anxiety Score  Nervous, Anxious, on Edge 1 2 0 0  Control/stop worrying 1 1 0 0  Worry too much - different things 1 1 0 0  Trouble relaxing 2 1 0 0  Restless 2 1 0 0  Easily annoyed or irritable 1 1 0 0  Afraid - awful might happen 0 0 0 0  Total GAD 7 Score 8 7 0 0  Anxiety Difficulty Somewhat difficult Somewhat difficult Not difficult at all Not difficult at all       02/06/2024    2:01 PM 11/29/2023    9:15 AM 07/09/2023    2:51 PM  Depression screen PHQ 2/9  Decreased Interest 3 2 0  Down, Depressed, Hopeless 0 0 0  PHQ - 2 Score 3 2 0  Altered sleeping 2 2 0  Tired, decreased energy 3 3 0  Change in appetite 2 0 0  Feeling bad or failure about yourself  0 0 0  Trouble concentrating 1 0 0  Moving slowly or fidgety/restless 0 0 0  Suicidal thoughts 0 0 0  PHQ-9 Score 11 7 0  Difficult doing work/chores Somewhat difficult Somewhat difficult Not difficult at all    BP Readings from Last 3 Encounters:  02/06/24 (!) 160/76  02/03/24 (!) 122/57  01/20/24 (!) 161/84    Wt Readings from Last 3 Encounters:  02/06/24 116 lb (52.6 kg)  02/03/24 124 lb 5.4 oz (56.4 kg)  01/20/24 107 lb 12.9 oz (48.9 kg)    BP (!) 160/76 (BP Location: Right Arm, Cuff Size: Normal)   Pulse 69   Temp 97.9 F (36.6 C)   Ht 5' 5 (1.651 m)   Wt 116 lb (52.6 kg)   SpO2 98%   BMI 19.30 kg/m   Physical Exam Vitals and nursing note reviewed.  Constitutional:      Appearance: Normal appearance.  Cardiovascular:     Rate and Rhythm: Normal rate and regular rhythm.     Heart sounds: No murmur heard.    No friction rub. No gallop.  Pulmonary:     Effort: Pulmonary effort is normal.     Breath sounds: Decreased air movement present. No wheezing, rhonchi or rales.  Abdominal:     General: There is no distension.  Musculoskeletal:        General: Normal  range of motion.     Left lower leg: 1+ Pitting Edema present.     Comments: Right BKA w/ prosthesis  Skin:    General: Skin is warm and  dry.  Neurological:     Mental Status: She is alert and oriented to person, place, and time.     Gait: Gait is intact.  Psychiatric:        Mood and Affect: Mood and affect normal.    Diabetic Foot Exam - Simple   Simple Foot Form Diabetic Foot exam was performed with the following findings: Yes 02/06/2024  2:18 PM  Visual Inspection See comments: Yes Sensation Testing See comments: Yes Pulse Check See comments: Yes Comments S/p BKA right. Left foot with onychomycosis toes 1-3.  No ulcers.  Sensation intact.  Pulses 1+.  Pitting edema 1+      Recent Labs     Component Value Date/Time   NA 134 (L) 02/03/2024 0834   NA 135 07/09/2023 1534   NA 141 04/02/2014 0427   K 4.5 02/03/2024 0834   K 3.8 04/02/2014 0427   CL 100 02/03/2024 0834   CL 108 (H) 04/02/2014 0427   CO2 24 02/03/2024 0834   CO2 28 04/02/2014 0427   GLUCOSE 167 (H) 02/03/2024 0834   GLUCOSE 249 (H) 04/02/2014 0427   BUN 70 (H) 02/03/2024 0834   BUN 76 (HH) 07/09/2023 1534   BUN 19 (H) 04/02/2014 0427   CREATININE 3.34 (H) 02/03/2024 0834   CREATININE 3.36 (H) 04/24/2023 0830   CREATININE 0.75 04/02/2014 0427   CALCIUM  8.1 (L) 02/03/2024 0834   CALCIUM  8.1 (L) 04/02/2014 0427   PROT 5.5 (L) 01/29/2024 2357   PROT 6.7 04/01/2014 1117   ALBUMIN 2.6 (L) 01/29/2024 2357   ALBUMIN 3.1 (L) 07/09/2023 1534   ALBUMIN 3.3 (L) 04/01/2014 1117   AST 28 01/29/2024 2357   AST 19 04/24/2023 0830   ALT 17 01/29/2024 2357   ALT 16 04/24/2023 0830   ALT 19 04/01/2014 1117   ALKPHOS 109 01/29/2024 2357   ALKPHOS 83 04/01/2014 1117   BILITOT 0.6 01/29/2024 2357   BILITOT 0.2 04/24/2023 0830   GFRNONAA 15 (L) 02/03/2024 0834   GFRNONAA 15 (L) 04/24/2023 0830   GFRNONAA >60 04/02/2014 0427   GFRNONAA 46 (L) 11/11/2012 0834   GFRAA 57 (L) 08/10/2020 1658   GFRAA >60  04/02/2014 0427   GFRAA 53 (L) 11/11/2012 0834    Lab Results  Component Value Date   WBC 4.3 02/02/2024   HGB 8.6 (L) 02/02/2024   HCT 27.6 (L) 02/02/2024   MCV 94.8 02/02/2024   PLT 229 02/02/2024   Lab Results  Component Value Date   HGBA1C 7.3 (H) 02/01/2024   HGBA1C 6.4 (A) 11/29/2023   HGBA1C 7.4 06/09/2022   Lab Results  Component Value Date   CHOL 268 (H) 05/26/2023   HDL 63 05/26/2023   LDLCALC 175 (H) 05/26/2023   TRIG 151 (H) 05/26/2023   CHOLHDL 4.3 05/26/2023   Lab Results  Component Value Date   TSH 2.795 01/29/2024      Assessment and Plan:  Chronic systolic congestive heart failure [facility]) Assessment & Plan: Coordinated follow-up with heart failure clinic today, which has been arranged for next Thursday.  I have brought this to her attention and printed out upcoming specialist appointments on her AVS today.  I also helped her get back into her MyChart account, and wrote down her username and password for her.   Primary hypertension Assessment & Plan: Discussed elevated readings with the patient today.  She suspects there is a Insurance risk surveyor.  Advised home blood pressure monitoring, no changes to pharmacotherapy today.  Will defer  to nephrology/cardiology for this.  Continue all medications as prescribed.   Type 2 diabetes mellitus with stage 5 chronic kidney disease not on chronic dialysis, with long-term current use of insulin  [facility]) Assessment & Plan: A1c 7.3% on 02/01/24 reflecting fair glycemic control.  Continue follow-up with endocrinology.   Moderate protein-calorie malnutrition (HCC) Assessment & Plan: Weight significantly fluctuating over the last 2 months, likely due to fluid status changes.  Present BMI 19.3.  Follow clinically   Onychomycosis of left great toe Assessment & Plan: Patient would like referral to podiatry.  Orders: -     Ambulatory referral to Podiatry     Return in about 2 months (around 04/07/2024) for OV f/u  chronic conditions.    Rolan Hoyle, PA-C, DMSc, Nutritionist [facility] [phone]

## 2024-02-06 NOTE — Assessment & Plan Note (Signed)
 Weight significantly fluctuating over the last 2 months, likely due to fluid status changes.  Present BMI 19.3.  Follow clinically

## 2024-02-06 NOTE — Assessment & Plan Note (Signed)
 Discussed elevated readings with the patient today.  She suspects there is a Insurance risk surveyor.  Advised home blood pressure monitoring, no changes to pharmacotherapy today.  Will defer to nephrology/cardiology for this.  Continue all medications as prescribed.

## 2024-02-07 ENCOUNTER — Other Ambulatory Visit (INDEPENDENT_AMBULATORY_CARE_PROVIDER_SITE_OTHER): Payer: Self-pay | Admitting: Vascular Surgery

## 2024-02-07 ENCOUNTER — Other Ambulatory Visit: Payer: Self-pay | Admitting: Physician Assistant

## 2024-02-07 DIAGNOSIS — D631 Anemia in chronic kidney disease: Secondary | ICD-10-CM | POA: Diagnosis not present

## 2024-02-07 DIAGNOSIS — N179 Acute kidney failure, unspecified: Secondary | ICD-10-CM | POA: Diagnosis not present

## 2024-02-07 DIAGNOSIS — Z95828 Presence of other vascular implants and grafts: Secondary | ICD-10-CM

## 2024-02-07 DIAGNOSIS — N184 Chronic kidney disease, stage 4 (severe): Secondary | ICD-10-CM | POA: Diagnosis not present

## 2024-02-07 DIAGNOSIS — N186 End stage renal disease: Secondary | ICD-10-CM

## 2024-02-07 DIAGNOSIS — I5032 Chronic diastolic (congestive) heart failure: Secondary | ICD-10-CM | POA: Diagnosis not present

## 2024-02-08 NOTE — Telephone Encounter (Signed)
 Requested Prescriptions  Refused Prescriptions Disp Refills   DULoxetine  (CYMBALTA ) 20 MG capsule [Pharmacy Med Name: DULOXETINE  HCL DR 20 MG CAP] 360 capsule 0    Sig: TAKE 2 CAPSULES (40 MG TOTAL) BY MOUTH 2 (TWO) TIMES DAILY.     Psychiatry: Antidepressants - SNRI - duloxetine  Failed - 02/08/2024  2:06 PM      Failed - Cr in normal range and within 360 days    Creatinine  Date Value Ref Range Status  04/24/2023 3.36 (H) 0.44 - 1.00 mg/dL Final  89/84/7984 9.24 0.60 - 1.30 mg/dL Final   Creatinine, Ser  Date Value Ref Range Status  02/03/2024 3.34 (H) 0.44 - 1.00 mg/dL Final   Creatinine, Urine  Date Value Ref Range Status  05/26/2023 96 mg/dL Final    Comment:    Performed at [facility], [address] [place], [place] [address]         Failed - eGFR is 30 or above and within 360 days    EGFR (African American)  Date Value Ref Range Status  04/02/2014 >60 >51mL/min Final  11/11/2012 53 (L)  Final   GFR calc Af Amer  Date Value Ref Range Status  08/10/2020 57 (L) >59 mL/min/1.73 Final    Comment:    **In accordance with recommendations from the NKF-ASN Task force,**   [facility] is in the process of updating its eGFR calculation to the   2021 CKD-EPI creatinine equation that estimates kidney function   without a race variable.    EGFR (Non-African Amer.)  Date Value Ref Range Status  04/02/2014 >60 >64mL/min Final    Comment:    eGFR values <72mL/min/1.73 m2 may be an indication of chronic kidney disease (CKD). Calculated eGFR, using the MRDR Study equation, is useful in  patients with stable renal function. The eGFR calculation will not be reliable in acutely ill patients when serum creatinine is changing rapidly. It is not useful in patients on dialysis. The eGFR calculation may not be applicable to patients at the low and high extremes of body sizes, pregnant women, and vegetarians.   11/11/2012 46 (L)  Final    Comment:    eGFR values  <30mL/min/1.73 m2 may be an indication of chronic kidney disease (CKD). Calculated eGFR is useful in patients with stable renal function. The eGFR calculation will not be reliable in acutely ill patients when serum creatinine is changing rapidly. It is not useful in  patients on dialysis. The eGFR calculation may not be applicable to patients at the low and high extremes of body sizes, pregnant women, and vegetarians.    GFR, Estimated  Date Value Ref Range Status  02/03/2024 15 (L) >60 mL/min Final    Comment:    (NOTE) Calculated using the CKD-EPI Creatinine Equation (2021)   04/24/2023 15 (L) >60 mL/min Final    Comment:    (NOTE) Calculated using the CKD-EPI Creatinine Equation (2021)    eGFR  Date Value Ref Range Status  07/09/2023 10 (L) >59 mL/min/1.73 Final         Failed - Last BP in normal range    BP Readings from Last 1 Encounters:  02/06/24 (!) 160/76         Passed - Completed PHQ-2 or PHQ-9 in the last 360 days      Passed - Valid encounter within last 6 months    Recent Outpatient Visits           2 days ago Chronic systolic  congestive heart failure [facility])   [place] [facility] at [facility], [place], [place]   2 months ago Encounter for screening mammogram for breast cancer   [place] [facility] at [facility], [place], [place]   2 months ago Type 2 diabetes mellitus with stage 5 chronic kidney disease not on chronic dialysis, with long-term current use of insulin  [facility])   [facility] at [facility], [place], [place]

## 2024-02-13 ENCOUNTER — Telehealth: Payer: Self-pay | Admitting: Family

## 2024-02-13 ENCOUNTER — Telehealth: Payer: Self-pay

## 2024-02-13 ENCOUNTER — Other Ambulatory Visit: Payer: Self-pay | Admitting: Physician Assistant

## 2024-02-13 DIAGNOSIS — E1122 Type 2 diabetes mellitus with diabetic chronic kidney disease: Secondary | ICD-10-CM

## 2024-02-13 MED ORDER — DEXCOM G7 SENSOR MISC: 1 refills | Status: DC

## 2024-02-13 NOTE — Telephone Encounter (Signed)
 Copied from CRM #[id number]. Topic: Clinical - Medication Prior Auth >> Feb 13, 2024  1:44 PM Ivette P wrote: Reason for CRM: Dorothyann called in about having the continuous glucose monitor for pt and would need a prior authorization to get it processed.

## 2024-02-13 NOTE — Progress Notes (Unsigned)
 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   Echo 05/30/23: EF 25%, G1DD, severe RV dysfunction  Admitted 06/21/23 due to being volume overloaded. Recent admission prior to that from 12/9 - 06/18/23 (at that time new HF diagnosis, suspected mixed etiology) then did not pick up meds at DC. This January admission pBNP was 89k (131k prior). IV diuresed. BL effusions on CXR. Resumed home GDMT/HTN agents. DC at 115 lbs.   Admitted again a few days later 06/28/23 with hypoglycemia. Incorrect home insulin  administration, which was corrected in hospital. One dose of IV lasix  in hospital. AKI at admission which was improving at DC.   Admitted 07/10/23 with SOB/ cough. Had stopped taking insulin  due to her concern about hypoglycemia although glucose levels have been 400-500's. Cr 4.7 (from 3.8), BUN 76, glucose 324, Hgb 8.6 (around baseline), lipase 110, BHB <0.18, VBG 7.33/43, lactate 1.4, pro-BNP 4321. CXR stable. Given IVF with improvement of renal function. Endocrinology consulted.   Admitted 10/01/23 with HF exacerbation after running out of diuretics 4 days prior. + UDS for cocaine. proBNP was over 32,000 and troponins were 88 when she came in and peaked at 448 but then down-trended quickly. Did not have EKG findings of new ischemia. Cardiology consulted. IV diuresed and weaned to room air.    Echo 12/31/23: EF 50-55%, G2DD, mild MR, normal RV   Was in the ED 01/20/24 with pain and swelling of the left arm after recent AV graft placement. Surgical graft placement on January 10, 2024 by vascular surgery Dr. Marea. Patient  reports since then she has had gradually worsening pain and swelling of the area. Ultrasound negative, vascular consulted. Antibiotics given.   Admitted 01/29/24 with two weeks of progressive sob worse over the last 3-4 days before presentation.Received lasix  drip with transition to oral diuretics. Cardiology and nephrology consults obtained.   She presents today for her initial HF visit with a chief complaint of shortness of breath. Has associated moderate fatigue. Sleeping well on 1 pillow. Has a Right BKA. Denies chest pain, dizziness, edema, palpitations. Has fistula in left arm for future dialysis. Has not taken her medications yet today and admits that she occasionally forgets them.    Review of Systems: [y] = yes, [ ]  = no   General: Weight gain [ ] ; Weight loss [ ] ; Anorexia [ ] ; Fatigue [name] ]; Fever [ ] ; Chills [ ] ; Weakness [ ]   Cardiac: Chest pain/pressure [ ] ; Resting SOB [ ] ; Exertional SOB [name] ]; Orthopnea [ ] ; Pedal Edema [ ] ; Palpitations [ ] ; Syncope [ ] ; Presyncope [ ] ; Paroxysmal nocturnal dyspnea[ ]   Pulmonary: Cough [ ] ; Wheezing[ ] ; Hemoptysis[ ] ; Sputum [ ] ; Snoring [ ]   GI: Vomiting[ ] ; Dysphagia[ ] ; Melena[ ] ; Hematochezia [ ] ; Heartburn[ ] ; Abdominal pain [ ] ; Constipation [ ] ; Diarrhea [ ] ; BRBPR [ ]   GU: Hematuria[ ] ; Dysuria [ ] ; Nocturia[ ]   Vascular: Pain in legs with walking [ ] ; Pain in feet with lying flat [ ] ; Non-healing sores [ ] ; Stroke [ y]; TIA [ ] ; Slurred speech [ ] ;  Neuro: Headaches[ ] ; Vertigo[ ] ;  Seizures[ ] ; Paresthesias[ ] ;Blurred vision [ ] ; Diplopia [ ] ; Vision changes [ ]   Ortho/Skin: Arthritis [ ] ; Joint pain [ ] ; Muscle pain [ ] ; Joint swelling [ ] ; Back Pain [ ] ; Rash [ ]   Psych: Depression[ ] ; Anxiety[ ]   Heme: Bleeding problems [ ] ; Clotting disorders [ ] ; Anemia [ y]  Endocrine: Diabetes [ y]; Thyroid  dysfunction[ ]    Past Medical History:  Diagnosis Date   Adenocarcinoma of left lung (HCC)    a.) stage IIIA (pT1c, pN2, cM0) --> s/p LLL  lobectomy + 4 cycles of carbo/taxol  + 1 cycle atezolizumab  (discontinued for grade IV toxicity)   Anemia of chronic renal failure    Cerebral microvascular disease    CKD (chronic kidney disease), stage V (HCC)    Cocaine abuse (HCC)    Coronary artery disease    a.) LHC (NSTEMI) 05/30/2010: 10% pLCxm 20% pRCA-1, 100% pRCA-2 - med mgmt; b.) LHC 02/24/2020: CTA pRCA with good L-R collaterals, 50% mLAD, 80% OM1 - med mgmt   Depression    Diabetic foot ulcer (HCC) 10/03/2019   Dyspnea    Elevated LEFT hemidiaphragm    HFrEF (heart failure with reduced ejection fraction) (HCC) 02/16/2020   a.) Dx'd in setting of acute DVT/PE   Hyperlipidemia    Hypertension    Long-term use of aspirin  therapy    NSTEMI (non-ST elevated myocardial infarction) (HCC) 05/28/2010   a.) troponins were trended: 1.70 --> 3.30 --> 4.90 ng/mL; b.) LHC 05/30/2010: 10% pLCx, 20% pRCA-1, 100% pRCA-2 (chronic) with good collaterals - med mgmt   Peripheral artery disease (HCC)    a.) 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   Systolic ejection murmur    T2DM (type 2 diabetes mellitus) (HCC)    Venous thromboembolism (VTE) 02/15/2020   a.) 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; CTA (+) for multiple RIGHT segmental/subsegment pulmonary emboli    Current Outpatient Medications  Medication Sig Dispense Refill   ACCU-CHEK GUIDE TEST test strip 3 (three) times daily.     aspirin  EC 81 MG tablet Take 81 mg by mouth in the morning. Swallow whole.     atorvastatin  (LIPITOR ) 80 MG tablet Take 1 tablet by mouth daily.     calcium  acetate (PHOSLO) 667 MG capsule Take 667 mg by mouth.     carboxymethylcellulose (REFRESH PLUS) 0.5 % SOLN Place 1 drop into both eyes in the morning and at bedtime.     CAREFINE PEN NEEDLES 32G X 4 MM MISC CAREFINE PEN NEEDLES 32G X 4 MM     carvedilol  (COREG ) 6.25 MG tablet Take 2 tablets (12.5 mg total) by mouth 2 (two) times daily with a meal. 120 tablet 13   Continuous Glucose Receiver (DEXCOM G7 RECEIVER) DEVI Use  as instructed with G7 sensors     Continuous Glucose Sensor (DEXCOM G7 SENSOR) MISC Dispense DexCom G7 sensors; Use 1 sensor q 10 days     DULoxetine  (CYMBALTA ) 20 MG capsule TAKE 2 CAPSULES (40 MG TOTAL) BY MOUTH 2 (TWO) TIMES DAILY. 360 capsule 0   Glucagon 3 MG/DOSE POWD Use 1 spray in 1 nostril for severe hypoglycemia, as per package instructions     HUMALOG  KWIKPEN 100 UNIT/ML KwikPen Inject 8 Units into the skin 3 (three) times daily after meals. (Patient taking differently: Inject 4 Units into the skin 3 (three) times daily after meals. PER SLIDING SCALE) 15 mL 11   hydrALAZINE  (APRESOLINE ) 100 MG tablet Take 100 mg by mouth 3 (three) times daily.  isosorbide  dinitrate (ISORDIL ) 30 MG tablet TAKE 1 TABLET BY MOUTH THREE TIMES A DAY 270 tablet 1   ketoconazole  (NIZORAL ) 2 % cream APPLY TO FEET TOPICALLY IN THE MORNING AND AT BED DAILY 60 g 1   LANTUS  SOLOSTAR 100 UNIT/ML Solostar Pen Inject 4 Units into the skin at bedtime.     torsemide  (DEMADEX ) 20 MG tablet Take 4 tablets (80 mg total) by mouth daily. Take 4 20 mg tabs = 80 mg 120 tablet 0   traMADol  (ULTRAM ) 50 MG tablet Take 1 tablet (50 mg total) by mouth every 6 (six) hours as needed. 20 tablet 0   Vitamin D, Ergocalciferol, (DRISDOL) 1.25 MG (50000 UNIT) CAPS capsule Take 50,000 Units by mouth.     No current facility-administered medications for this visit.   Facility-Administered Medications Ordered in Other Visits  Medication Dose Route Frequency Provider Last Rate Last Admin   heparin  lock flush 100 UNIT/ML injection            heparin  lock flush 100 unit/mL  500 Units Intravenous Once Yu, Zhou, MD        Allergies  Allergen Reactions   Lisinopril Swelling and Other (See Comments)    Recurrent AKI and hyperkalemia when initiation attempted. Other Reaction(s): Facial edema      Social History   Socioeconomic History   Marital status: Married    Spouse name: Not on file   Number of children: Not on file   Years  of education: Not on file   Highest education level: Not on file  Occupational History   Not on file  Tobacco Use   Smoking status: Former    Current packs/day: 0.00    Types: Cigars, Cigarettes    Quit date: 01/17/2021    Years since quitting: 3.0   Smokeless tobacco: Never  Vaping Use   Vaping status: Never Used  Substance and Sexual Activity   Alcohol  use: Never   Drug use: Not Currently   Sexual activity: Not Currently  Other Topics Concern   Not on file  Social History Narrative   Not on file   Social Drivers of Health   Financial Resource Strain: Low Risk  (06/29/2023)   Received from [facility]   Overall Financial Resource Strain (CARDIA)    Difficulty of Paying Living Expenses: Not hard at all  Food Insecurity: No Food Insecurity (01/30/2024)   Hunger Vital Sign    Worried About Running Out of Food in the Last Year: Never true    Ran Out of Food in the Last Year: Never true  Transportation Needs: No Transportation Needs (01/30/2024)   PRAPARE - Administrator, Civil Service (Medical): No    Lack of Transportation (Non-Medical): No  Physical Activity: Not on file  Stress: Stress Concern Present (12/15/2021)   [employer] of Occupational Health - Occupational Stress Questionnaire    Feeling of Stress : To some extent  Social Connections: Moderately Integrated (01/30/2024)   Social Connection and Isolation Panel    Frequency of Communication with Friends and Family: More than three times a week    Frequency of Social Gatherings with Friends and Family: Once a week    Attends Religious Services: More than 4 times per year    Active Member of [employer] or Organizations: Yes    Attends Banker Meetings: More than 4 times per year    Marital Status: Widowed  Intimate Partner Violence: Not At Risk (01/30/2024)  Humiliation, Afraid, Rape, and Kick questionnaire    Fear of Current or Ex-Partner: No    Emotionally Abused: No     Physically Abused: No    Sexually Abused: No      Family History  Problem Relation Age of Onset   Heart disease Mother    Diabetes Mother    Heart disease Father    Diabetes Father    Vitals:   02/14/24 1012  BP: (!) 188/81  Pulse: 72  SpO2: 100%  Weight: 117 lb (53.1 kg)   Wt Readings from Last 3 Encounters:  02/14/24 117 lb (53.1 kg)  02/06/24 116 lb (52.6 kg)  02/03/24 124 lb 5.4 oz (56.4 kg)   Lab Results  Component Value Date   CREATININE 3.34 (H) 02/03/2024   CREATININE 3.59 (H) 02/02/2024   CREATININE 3.73 (H) 02/01/2024    PHYSICAL EXAM:  General: Well appearing female in wheelchair.  Cor: No JVD. Regular rhythm, rate.  Lungs: clear Abdomen: soft, nontender, nondistended. Extremities: 1+ pitting edema left lower leg. Prosthesis on right leg Neuro:. Affect pleasant   ECG: not done   ASSESSMENT & PLAN:  1: ICM with preserved ejection fraction- - multivessel CAD - NYHA class III - euvolemic - weighing daily - Echo 05/30/23: EF 25%, G1DD, severe RV dysfunction - Echo 12/31/23: EF 50-55%, G2DD, mild MR, normal RV  - saw [facility] cardiology 06/25; returns 09/25 - increase carvedilol  to 25mg  BID for BP - continue torsemide  80mg  daily - CKD prohibits MRA/ ARNi/ ARB/ SGLT2 - get compression sock and wear it daily on her left leg - elevate legs when sitting for long periods of time - RN nurse navigator reviewed HF information with patient - BNP 01/29/24 was 3838.7  2: HTN- - BP 188/81 but hasn't taken meds yet today. She is interested in pill packing so will send RX's to [facility] pharmacy for this.  - continue hydralazine / isordil  TID - saw PCP Vinie) 08/25 - BMET 02/07/24 reviewed: sodium 135, potassium 4.7, creatinine 3.63 & GFR 13  3: T2DM- - A1c 02/01/24 was 7.3% - to see endocrinology (09/25)  4: PVD- - right BKA 2021 - saw vascular Luna) 06/25  5: CKD- - fistula placed 07/25 for future dialysis - saw nephrology Geoffry) 08/25  6:  Hyperlipidemia- - LDL 08/31/23 was 122 - continue atorvastatin  80mg  daily   Return in 1 month, sooner if needed.   [name] [name] Class, FNP 02/13/24

## 2024-02-13 NOTE — Telephone Encounter (Signed)
 Called to confirm/remind patient of their appointment at the [facility] on 02/14/24.   Appointment:   [x] Confirmed  [] Left mess   [] No answer/No voice mail  [] VM Full/unable to leave message  [] Phone not in service  Patient reminded to bring all medications and/or complete list.  Confirmed patient has transportation. Gave directions, instructed to utilize valet parking.

## 2024-02-14 ENCOUNTER — Ambulatory Visit: Attending: Family | Admitting: Family

## 2024-02-14 ENCOUNTER — Other Ambulatory Visit: Payer: Self-pay

## 2024-02-14 ENCOUNTER — Other Ambulatory Visit (HOSPITAL_COMMUNITY): Payer: Self-pay

## 2024-02-14 ENCOUNTER — Ambulatory Visit (INDEPENDENT_AMBULATORY_CARE_PROVIDER_SITE_OTHER)

## 2024-02-14 ENCOUNTER — Telehealth: Payer: Self-pay

## 2024-02-14 ENCOUNTER — Encounter: Payer: Self-pay | Admitting: Family

## 2024-02-14 ENCOUNTER — Ambulatory Visit (INDEPENDENT_AMBULATORY_CARE_PROVIDER_SITE_OTHER): Admitting: Nurse Practitioner

## 2024-02-14 ENCOUNTER — Encounter (INDEPENDENT_AMBULATORY_CARE_PROVIDER_SITE_OTHER): Payer: Self-pay | Admitting: Nurse Practitioner

## 2024-02-14 ENCOUNTER — Encounter: Payer: Self-pay | Admitting: Oncology

## 2024-02-14 VITALS — BP 188/81 | HR 72 | Wt 117.0 lb

## 2024-02-14 VITALS — BP 167/85 | HR 76

## 2024-02-14 DIAGNOSIS — N185 Chronic kidney disease, stage 5: Secondary | ICD-10-CM

## 2024-02-14 DIAGNOSIS — Z87891 Personal history of nicotine dependence: Secondary | ICD-10-CM | POA: Diagnosis not present

## 2024-02-14 DIAGNOSIS — I2510 Atherosclerotic heart disease of native coronary artery without angina pectoris: Secondary | ICD-10-CM | POA: Insufficient documentation

## 2024-02-14 DIAGNOSIS — E785 Hyperlipidemia, unspecified: Secondary | ICD-10-CM | POA: Diagnosis not present

## 2024-02-14 DIAGNOSIS — E782 Mixed hyperlipidemia: Secondary | ICD-10-CM | POA: Diagnosis not present

## 2024-02-14 DIAGNOSIS — Z794 Long term (current) use of insulin: Secondary | ICD-10-CM

## 2024-02-14 DIAGNOSIS — Z95828 Presence of other vascular implants and grafts: Secondary | ICD-10-CM | POA: Diagnosis not present

## 2024-02-14 DIAGNOSIS — I10 Essential (primary) hypertension: Secondary | ICD-10-CM

## 2024-02-14 DIAGNOSIS — R0602 Shortness of breath: Secondary | ICD-10-CM | POA: Diagnosis not present

## 2024-02-14 DIAGNOSIS — Z79899 Other long term (current) drug therapy: Secondary | ICD-10-CM | POA: Insufficient documentation

## 2024-02-14 DIAGNOSIS — E1122 Type 2 diabetes mellitus with diabetic chronic kidney disease: Secondary | ICD-10-CM | POA: Diagnosis not present

## 2024-02-14 DIAGNOSIS — I132 Hypertensive heart and chronic kidney disease with heart failure and with stage 5 chronic kidney disease, or end stage renal disease: Secondary | ICD-10-CM | POA: Insufficient documentation

## 2024-02-14 DIAGNOSIS — N186 End stage renal disease: Secondary | ICD-10-CM

## 2024-02-14 DIAGNOSIS — I5022 Chronic systolic (congestive) heart failure: Secondary | ICD-10-CM | POA: Insufficient documentation

## 2024-02-14 DIAGNOSIS — I255 Ischemic cardiomyopathy: Secondary | ICD-10-CM | POA: Diagnosis not present

## 2024-02-14 DIAGNOSIS — I5032 Chronic diastolic (congestive) heart failure: Secondary | ICD-10-CM

## 2024-02-14 DIAGNOSIS — I739 Peripheral vascular disease, unspecified: Secondary | ICD-10-CM

## 2024-02-14 MED ORDER — ISOSORBIDE DINITRATE 30 MG PO TABS
30.0000 mg | ORAL_TABLET | Freq: Three times a day (TID) | ORAL | 5 refills | Status: DC
  Filled 2024-02-14: qty 90, 30d supply, fill #0

## 2024-02-14 MED ORDER — TORSEMIDE 20 MG PO TABS
80.0000 mg | ORAL_TABLET | Freq: Every day | ORAL | 5 refills | Status: DC
  Filled 2024-02-14: qty 120, 30d supply, fill #0

## 2024-02-14 MED ORDER — CARVEDILOL 25 MG PO TABS: 25.0000 mg | ORAL_TABLET | Freq: Two times a day (BID) | ORAL | 3 refills | Status: DC

## 2024-02-14 MED ORDER — ATORVASTATIN CALCIUM 80 MG PO TABS
80.0000 mg | ORAL_TABLET | Freq: Every day | ORAL | 5 refills | Status: AC
  Filled 2024-02-14: qty 30, 30d supply, fill #0

## 2024-02-14 MED ORDER — CARVEDILOL 25 MG PO TABS
25.0000 mg | ORAL_TABLET | Freq: Two times a day (BID) | ORAL | 3 refills | Status: AC
  Filled 2024-02-14 (×2): qty 180, 90d supply, fill #0

## 2024-02-14 MED ORDER — HYDRALAZINE HCL 100 MG PO TABS
100.0000 mg | ORAL_TABLET | Freq: Three times a day (TID) | ORAL | 5 refills | Status: AC
  Filled 2024-02-14: qty 90, 30d supply, fill #0

## 2024-02-14 NOTE — Patient Instructions (Addendum)
 Medication Changes:  Increase carvedilol  to 25 mg twice daily   Special Instructions // Education:  Elevate your legs when sitting.  Wear compression socks daily and remove them at bedtime.   It was a pleasure meeting you today!   Follow-Up in: 1 month with Ellouise Class, FNP.   Thank you for choosing [facility].    At the Advanced [facility] Failure Clinic, you and your health needs are our priority. We have a designated team specialized in the treatment of Heart Failure. This Care Team includes your primary Heart Failure Specialized Cardiologist (physician), Advanced Practice Providers (APPs- Physician Assistants and Nurse Practitioners), and Pharmacist who all work together to provide you with the care you need, when you need it.   You may see any of the following providers on your designated Care Team at your next follow up:  Dr. Toribio Fuel Dr. Ezra Shuck Dr. Ria Commander Dr. Morene Brownie Ellouise Class, FNP Jaun Bash, RPH-CPP  Please be sure to bring in all your medications bottles to every appointment.   Need to Contact Us :  If you have any questions or concerns before your next appointment please send us  a message through [place] or call our office at [phone].    TO LEAVE A MESSAGE FOR THE NURSE SELECT OPTION 2, PLEASE LEAVE A MESSAGE INCLUDING: YOUR NAME DATE OF BIRTH CALL BACK NUMBER REASON FOR CALL**this is important as we prioritize the call backs  YOU WILL RECEIVE A CALL BACK THE SAME DAY AS LONG AS YOU CALL BEFORE 4:00 PM

## 2024-02-19 NOTE — Progress Notes (Signed)
 Subjective:    Patient ID: [name] [name], female    DOB: Oct 07, 1955, 68 y.o.   MRN: [id number] Chief Complaint  Patient presents with   Follow-up    The patient returns today for follow-up evaluation of her left brachial axillary AV graft.  Overall she is doing well with this.  Today she has a known volume of 1636.  No evidence of any significant stenosis is noted.  Currently she is not on dialysis as of yet.  In the incision line there is just a small opening.  It is not completely dehisced.    Review of Systems  Neurological:  Positive for weakness.  All other systems reviewed and are negative.      Objective:   Physical Exam Vitals reviewed.  HENT:     Head: Normocephalic.  Cardiovascular:     Rate and Rhythm: Normal rate.     Arteriovenous access: Left arteriovenous access is present.     Comments: Good thrill and bruit Pulmonary:     Effort: Pulmonary effort is normal.  Skin:    General: Skin is warm and dry.  Neurological:     Mental Status: She is alert and oriented to person, place, and time.  Psychiatric:        Mood and Affect: Mood normal.        Behavior: Behavior normal.        Thought Content: Thought content normal.        Judgment: Judgment normal.     BP (!) 167/85   Pulse 76   Past Medical History:  Diagnosis Date   Adenocarcinoma of left lung (HCC)    a.) stage IIIA (pT1c, pN2, cM0) --> s/p LLL lobectomy + 4 cycles of carbo/taxol  + 1 cycle atezolizumab  (discontinued for grade IV toxicity)   Anemia of chronic renal failure    Cerebral microvascular disease    CKD (chronic kidney disease), stage V (HCC)    Cocaine abuse (HCC)    Coronary artery disease    a.) LHC (NSTEMI) 05/30/2010: 10% pLCxm 20% pRCA-1, 100% pRCA-2 - med mgmt; b.) LHC 02/24/2020: CTA pRCA with good L-R collaterals, 50% mLAD, 80% OM1 - med mgmt   Depression    Diabetic foot ulcer (HCC) 10/03/2019   Dyspnea    Elevated LEFT hemidiaphragm    HFrEF (heart failure with  reduced ejection fraction) (HCC) 02/16/2020   a.) Dx'd in setting of acute DVT/PE   Hyperlipidemia    Hypertension    Long-term use of aspirin  therapy    NSTEMI (non-ST elevated myocardial infarction) (HCC) 05/28/2010   a.) troponins were trended: 1.70 --> 3.30 --> 4.90 ng/mL; b.) LHC 05/30/2010: 10% pLCx, 20% pRCA-1, 100% pRCA-2 (chronic) with good collaterals - med mgmt   Peripheral artery disease (HCC)    a.) 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   Systolic ejection murmur    T2DM (type 2 diabetes mellitus) (HCC)    Venous thromboembolism (VTE) 02/15/2020   a.) 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; CTA (+) for multiple RIGHT segmental/subsegment pulmonary emboli    Social History   Socioeconomic History   Marital status: Married    Spouse name: Not on file   Number of children: Not on file   Years of education: Not on file   Highest education level: Not on file  Occupational History   Not on file  Tobacco Use   Smoking status: Former    Current packs/day: 0.00    Types: Cigars, Cigarettes  Quit date: 01/17/2021    Years since quitting: 3.0   Smokeless tobacco: Never  Vaping Use   Vaping status: Never Used  Substance and Sexual Activity   Alcohol  use: Never   Drug use: Not Currently   Sexual activity: Not Currently  Other Topics Concern   Not on file  Social History Narrative   Not on file   Social Drivers of Health   Financial Resource Strain: Low Risk  (06/29/2023)   Received from [facility]   Overall Financial Resource Strain (CARDIA)    Difficulty of Paying Living Expenses: Not hard at all  Food Insecurity: No Food Insecurity (01/30/2024)   Hunger Vital Sign    Worried About Running Out of Food in the Last Year: Never true    Ran Out of Food in the Last Year: Never true  Transportation Needs: No Transportation Needs (01/30/2024)   PRAPARE - Administrator, Civil Service (Medical): No    Lack of Transportation (Non-Medical): No  Physical  Activity: Not on file  Stress: Stress Concern Present (12/15/2021)   [employer] of Occupational Health - Occupational Stress Questionnaire    Feeling of Stress : To some extent  Social Connections: Moderately Integrated (01/30/2024)   Social Connection and Isolation Panel    Frequency of Communication with Friends and Family: More than three times a week    Frequency of Social Gatherings with Friends and Family: Once a week    Attends Religious Services: More than 4 times per year    Active Member of [employer] or Organizations: Yes    Attends Banker Meetings: More than 4 times per year    Marital Status: Widowed  Intimate Partner Violence: Not At Risk (01/30/2024)   Humiliation, Afraid, Rape, and Kick questionnaire    Fear of Current or Ex-Partner: No    Emotionally Abused: No    Physically Abused: No    Sexually Abused: No    Past Surgical History:  Procedure Laterality Date   BELOW KNEE LEG AMPUTATION Right 01/06/2020   INSERTION OF ARTERIOVENOUS (AV) ARTEGRAFT ARM Left 01/10/2024   Procedure: INSERTION, GRAFT, ARTERIOVENOUS, UPPER EXTREMITY;  Surgeon: Marea Selinda RAMAN, MD;  Location: [facility] ORS;  Service: Vascular;  Laterality: Left;  BRACHIAL AXILLARY   IR IMAGING GUIDED PORT INSERTION  07/27/2021   LEFT HEART CATH AND CORONARY ANGIOGRAPHY Left    Procedure: LEFT HEART CATHETERIZATION AND CORONARY ANGIOGRAPHY; Location: [facility]; Surgeon: Gerhardt Mana and Zachary Prentice Car, MD   LEFT HEART CATH AND CORONARY ANGIOGRAPHY Left 05/30/2010   Procedure: LEFT HEART CATH AND CORONARY ANGIOGRAPHY; Location: [facility]; Surgeon: Vinie Jude, MD   LEFT LOWER PULMONARY LOBECTOMY AND LYMPH NODE DISSECTION Left 02/06/2021   PORTA CATH INSERTION      Family History  Problem Relation Age of Onset   Heart disease Mother    Diabetes Mother    Heart disease Father    Diabetes Father     Allergies  Allergen Reactions   Lisinopril Swelling and Other (See Comments)    Recurrent AKI and  hyperkalemia when initiation attempted. Other Reaction(s): Facial edema       Latest Ref Rng & Units 02/02/2024    8:00 AM 02/01/2024    4:27 AM 01/31/2024    6:05 AM  CBC  WBC 4.0 - 10.5 K/uL 4.3  4.8  4.4   Hemoglobin 12.0 - 15.0 g/dL 8.6  7.3  7.5   Hematocrit 36.0 - 46.0 % 27.6  23.1  24.7   Platelets 150 - 400 K/uL 229  245  255       CMP     Component Value Date/Time   NA 134 (L) 02/03/2024 0834   NA 135 07/09/2023 1534   NA 141 04/02/2014 0427   K 4.5 02/03/2024 0834   K 3.8 04/02/2014 0427   CL 100 02/03/2024 0834   CL 108 (H) 04/02/2014 0427   CO2 24 02/03/2024 0834   CO2 28 04/02/2014 0427   GLUCOSE 167 (H) 02/03/2024 0834   GLUCOSE 249 (H) 04/02/2014 0427   BUN 70 (H) 02/03/2024 0834   BUN 76 (HH) 07/09/2023 1534   BUN 19 (H) 04/02/2014 0427   CREATININE 3.34 (H) 02/03/2024 0834   CREATININE 3.36 (H) 04/24/2023 0830   CREATININE 0.75 04/02/2014 0427   CALCIUM  8.1 (L) 02/03/2024 0834   CALCIUM  8.1 (L) 04/02/2014 0427   PROT 5.5 (L) 01/29/2024 2357   PROT 6.7 04/01/2014 1117   ALBUMIN 2.6 (L) 01/29/2024 2357   ALBUMIN 3.1 (L) 07/09/2023 1534   ALBUMIN 3.3 (L) 04/01/2014 1117   AST 28 01/29/2024 2357   AST 19 04/24/2023 0830   ALT 17 01/29/2024 2357   ALT 16 04/24/2023 0830   ALT 19 04/01/2014 1117   ALKPHOS 109 01/29/2024 2357   ALKPHOS 83 04/01/2014 1117   BILITOT 0.6 01/29/2024 2357   BILITOT 0.2 04/24/2023 0830   EGFR 10 (L) 07/09/2023 1534   GFRNONAA 15 (L) 02/03/2024 0834   GFRNONAA 15 (L) 04/24/2023 0830   GFRNONAA >60 04/02/2014 0427   GFRNONAA 46 (L) 11/11/2012 0834     No results found.     Assessment & Plan:   1. CKD (chronic kidney disease) stage 5, GFR less than 15 ml/min (HCC) (Primary) At this time the patient is currently not on dialysis.  If she needed to begin dialysis, she currently has adequate access for use.  She has just a very small wound at the incision line.  I believe it should be able to heal up quickly with the use  of a hydrocolloid bandage which often instructed for placed on the wound.  Will plan on having her return in about 2 to 3 weeks to evaluate the area.   Current Outpatient Medications on File Prior to Visit  Medication Sig Dispense Refill   ACCU-CHEK GUIDE TEST test strip 3 (three) times daily.     aspirin  EC 81 MG tablet Take 81 mg by mouth in the morning. Swallow whole.     calcium  acetate (PHOSLO) 667 MG capsule Take 667 mg by mouth.     carboxymethylcellulose (REFRESH PLUS) 0.5 % SOLN Place 1 drop into both eyes in the morning and at bedtime.     CAREFINE PEN NEEDLES 32G X 4 MM MISC CAREFINE PEN NEEDLES 32G X 4 MM     Continuous Glucose Receiver (DEXCOM G7 RECEIVER) DEVI Use as instructed with G7 sensors     Continuous Glucose Sensor (DEXCOM G7 SENSOR) MISC Place one sensor on the skin every 10 days for use with Dexcom reader. 9 each 1   DULoxetine  (CYMBALTA ) 20 MG capsule TAKE 2 CAPSULES (40 MG TOTAL) BY MOUTH 2 (TWO) TIMES DAILY. 360 capsule 0   Glucagon 3 MG/DOSE POWD Use 1 spray in 1 nostril for severe hypoglycemia, as per package instructions     HUMALOG  KWIKPEN 100 UNIT/ML KwikPen Inject 8 Units into the skin 3 (three) times daily after meals. (Patient taking differently: Inject 4 Units into the  skin 3 (three) times daily after meals. PER SLIDING SCALE) 15 mL 11   ketoconazole  (NIZORAL ) 2 % cream APPLY TO FEET TOPICALLY IN THE MORNING AND AT BED DAILY 60 g 1   LANTUS  SOLOSTAR 100 UNIT/ML Solostar Pen Inject 4 Units into the skin at bedtime.     traMADol  (ULTRAM ) 50 MG tablet Take 1 tablet (50 mg total) by mouth every 6 (six) hours as needed. 20 tablet 0   Vitamin D, Ergocalciferol, (DRISDOL) 1.25 MG (50000 UNIT) CAPS capsule Take 50,000 Units by mouth.     Current Facility-Administered Medications on File Prior to Visit  Medication Dose Route Frequency Provider Last Rate Last Admin   heparin  lock flush 100 UNIT/ML injection            heparin  lock flush 100 unit/mL  500 Units  Intravenous Once Yu, Zhou, MD        There are no Patient Instructions on file for this visit. No follow-ups on file.   Dequarius Jeffries E Asencion Loveday, NP

## 2024-02-22 DIAGNOSIS — E1165 Type 2 diabetes mellitus with hyperglycemia: Secondary | ICD-10-CM | POA: Diagnosis not present

## 2024-02-22 DIAGNOSIS — Z794 Long term (current) use of insulin: Secondary | ICD-10-CM | POA: Diagnosis not present

## 2024-02-26 ENCOUNTER — Telehealth (INDEPENDENT_AMBULATORY_CARE_PROVIDER_SITE_OTHER): Payer: Self-pay

## 2024-02-26 NOTE — Telephone Encounter (Signed)
 Lela Pouch NP from [facility] reach out to inform that the patient wound has not completely healed, no fever, but some swelling around fistula past 2 days. patient is currently on antibiotic Keflex . Patient was last seen in the office 02/14/24 and AV graft creation on 01/10/24. Patient will be schedule to come in for wound check.

## 2024-02-27 ENCOUNTER — Inpatient Hospital Stay: Admission: RE | Admit: 2024-02-27 | Source: Ambulatory Visit

## 2024-02-27 ENCOUNTER — Ambulatory Visit

## 2024-02-29 ENCOUNTER — Ambulatory Visit (INDEPENDENT_AMBULATORY_CARE_PROVIDER_SITE_OTHER): Admitting: Vascular Surgery

## 2024-02-29 ENCOUNTER — Encounter: Payer: Self-pay | Admitting: Oncology

## 2024-02-29 ENCOUNTER — Encounter (INDEPENDENT_AMBULATORY_CARE_PROVIDER_SITE_OTHER): Payer: Self-pay | Admitting: Vascular Surgery

## 2024-02-29 VITALS — BP 162/71 | HR 73 | Resp 16 | Wt 114.0 lb

## 2024-02-29 DIAGNOSIS — E785 Hyperlipidemia, unspecified: Secondary | ICD-10-CM

## 2024-02-29 DIAGNOSIS — I10 Essential (primary) hypertension: Secondary | ICD-10-CM

## 2024-02-29 DIAGNOSIS — N185 Chronic kidney disease, stage 5: Secondary | ICD-10-CM

## 2024-02-29 DIAGNOSIS — Z95828 Presence of other vascular implants and grafts: Secondary | ICD-10-CM

## 2024-02-29 DIAGNOSIS — E1122 Type 2 diabetes mellitus with diabetic chronic kidney disease: Secondary | ICD-10-CM

## 2024-02-29 DIAGNOSIS — Z794 Long term (current) use of insulin: Secondary | ICD-10-CM

## 2024-03-04 ENCOUNTER — Encounter (INDEPENDENT_AMBULATORY_CARE_PROVIDER_SITE_OTHER): Payer: Self-pay | Admitting: Vascular Surgery

## 2024-03-04 NOTE — Progress Notes (Signed)
 Subjective:    Patient ID: [name] [name], female    DOB: 08/17/1955, 68 y.o.   MRN: [id number] Chief Complaint  Patient presents with   Wound Check    Left arm wound check    [name] [name] is a 68 yo female who presents to clinic today for a left upper arm wound check post A/V Graft placement.   The patient returns today for follow-up evaluation of her left brachial axillary AV graft.  Overall she is doing well with this.  Today she has a known volume of 1636.  No evidence of any significant stenosis is noted.  Currently she is not on dialysis as of yet. The incision line had just a small opening which appears to be completley healed over today.  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 Patient denies any pain but endorses over the past week it just developed.     Review of Systems  Constitutional: Negative.   Skin:  Positive for wound.       Left Brachial post operative A/V Graft incision   All other systems reviewed and are negative.      Objective:   Physical Exam Constitutional:      Appearance: Normal appearance. She is normal weight.  HENT:     Head: Normocephalic.  Eyes:     Pupils: Pupils are equal, round, and reactive to light.  Cardiovascular:     Rate and Rhythm: Normal rate and regular rhythm.     Pulses: Normal pulses.     Heart sounds: Normal heart sounds.  Pulmonary:     Effort: Pulmonary effort is normal.     Breath sounds: Normal breath sounds.  Abdominal:     General: Abdomen is flat. Bowel sounds are normal.     Palpations: Abdomen is soft.  Musculoskeletal:        General: Normal range of motion.  Skin:    General: Skin is warm and dry.     Capillary Refill: Capillary refill takes 2 to 3 seconds.     Comments: Positive seroma to the left brachial area of the graft.   Neurological:     General: No focal deficit present.     Mental Status: She is alert and oriented to person, place, and time. Mental status is  at baseline.  Psychiatric:        Mood and Affect: Mood normal.        Behavior: Behavior normal.        Thought Content: Thought content normal.        Judgment: Judgment normal.     BP (!) 162/71   Pulse 73   Resp 16   Wt 114 lb (51.7 kg)   BMI 18.97 kg/m   Past Medical History:  Diagnosis Date   Adenocarcinoma of left lung (HCC)    a.) stage IIIA (pT1c, pN2, cM0) --> s/p LLL lobectomy + 4 cycles of carbo/taxol  + 1 cycle atezolizumab  (discontinued for grade IV toxicity)   Anemia of chronic renal failure    Cerebral microvascular disease    CKD (chronic kidney disease), stage V (HCC)    Cocaine abuse (HCC)    Coronary artery disease    a.) LHC (NSTEMI) 05/30/2010: 10% pLCxm 20% pRCA-1, 100% pRCA-2 - med mgmt; b.) LHC 02/24/2020: CTA pRCA with good L-R collaterals, 50% mLAD, 80% OM1 - med mgmt   Depression    Diabetic foot ulcer (HCC) 10/03/2019  Dyspnea    Elevated LEFT hemidiaphragm    HFrEF (heart failure with reduced ejection fraction) (HCC) 02/16/2020   a.) Dx'd in setting of acute DVT/PE   Hyperlipidemia    Hypertension    Long-term use of aspirin  therapy    NSTEMI (non-ST elevated myocardial infarction) (HCC) 05/28/2010   a.) troponins were trended: 1.70 --> 3.30 --> 4.90 ng/mL; b.) LHC 05/30/2010: 10% pLCx, 20% pRCA-1, 100% pRCA-2 (chronic) with good collaterals - med mgmt   Peripheral artery disease (HCC)    a.) 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   Systolic ejection murmur    T2DM (type 2 diabetes mellitus) (HCC)    Venous thromboembolism (VTE) 02/15/2020   a.) 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; CTA (+) for multiple RIGHT segmental/subsegment pulmonary emboli    Social History   Socioeconomic History   Marital status: Married    Spouse name: Not on file   Number of children: Not on file   Years of education: Not on file   Highest education level: Not on file  Occupational History   Not on file  Tobacco Use   Smoking status: Former    Current packs/day: 0.00     Types: Cigars, Cigarettes    Quit date: 01/17/2021    Years since quitting: 3.1   Smokeless tobacco: Never  Vaping Use   Vaping status: Never Used  Substance and Sexual Activity   Alcohol  use: Never   Drug use: Not Currently   Sexual activity: Not Currently  Other Topics Concern   Not on file  Social History Narrative   Not on file   Social Drivers of Health   Financial Resource Strain: Low Risk  (06/29/2023)   Received from [facility]   Overall Financial Resource Strain (CARDIA)    Difficulty of Paying Living Expenses: Not hard at all  Food Insecurity: No Food Insecurity (01/30/2024)   Hunger Vital Sign    Worried About Running Out of Food in the Last Year: Never true    Ran Out of Food in the Last Year: Never true  Transportation Needs: No Transportation Needs (01/30/2024)   PRAPARE - Administrator, Civil Service (Medical): No    Lack of Transportation (Non-Medical): No  Physical Activity: Not on file  Stress: Stress Concern Present (12/15/2021)   [employer] of Occupational Health - Occupational Stress Questionnaire    Feeling of Stress : To some extent  Social Connections: Moderately Integrated (01/30/2024)   Social Connection and Isolation Panel    Frequency of Communication with Friends and Family: More than three times a week    Frequency of Social Gatherings with Friends and Family: Once a week    Attends Religious Services: More than 4 times per year    Active Member of [employer] or Organizations: Yes    Attends Banker Meetings: More than 4 times per year    Marital Status: Widowed  Intimate Partner Violence: Not At Risk (01/30/2024)   Humiliation, Afraid, Rape, and Kick questionnaire    Fear of Current or Ex-Partner: No    Emotionally Abused: No    Physically Abused: No    Sexually Abused: No    Past Surgical History:  Procedure Laterality Date   BELOW KNEE LEG AMPUTATION Right 01/06/2020   INSERTION OF  ARTERIOVENOUS (AV) ARTEGRAFT ARM Left 01/10/2024   Procedure: INSERTION, GRAFT, ARTERIOVENOUS, UPPER EXTREMITY;  Surgeon: Marea Selinda RAMAN, MD;  Location: [facility];  Service: Vascular;  Laterality: Left;  BRACHIAL  AXILLARY   IR IMAGING GUIDED PORT INSERTION  07/27/2021   LEFT HEART CATH AND CORONARY ANGIOGRAPHY Left    Procedure: LEFT HEART CATHETERIZATION AND CORONARY ANGIOGRAPHY; Location: [facility]; Surgeon: Gerhardt Mana and Zachary Prentice Car, MD   LEFT HEART CATH AND CORONARY ANGIOGRAPHY Left 05/30/2010   Procedure: LEFT HEART CATH AND CORONARY ANGIOGRAPHY; Location: [facility]; Surgeon: Vinie Jude, MD   LEFT LOWER PULMONARY LOBECTOMY AND LYMPH NODE DISSECTION Left 02/06/2021   PORTA CATH INSERTION      Family History  Problem Relation Age of Onset   Heart disease Mother    Diabetes Mother    Heart disease Father    Diabetes Father     Allergies  Allergen Reactions   Lisinopril Swelling and Other (See Comments)    Recurrent AKI and hyperkalemia when initiation attempted. Other Reaction(s): Facial edema       Latest Ref Rng & Units 02/02/2024    8:00 AM 02/01/2024    4:27 AM 01/31/2024    6:05 AM  CBC  WBC 4.0 - 10.5 K/uL 4.3  4.8  4.4   Hemoglobin 12.0 - 15.0 g/dL 8.6  7.3  7.5   Hematocrit 36.0 - 46.0 % 27.6  23.1  24.7   Platelets 150 - 400 K/uL 229  245  255       CMP     Component Value Date/Time   NA 134 (L) 02/03/2024 0834   NA 135 07/09/2023 1534   NA 141 04/02/2014 0427   K 4.5 02/03/2024 0834   K 3.8 04/02/2014 0427   CL 100 02/03/2024 0834   CL 108 (H) 04/02/2014 0427   CO2 24 02/03/2024 0834   CO2 28 04/02/2014 0427   GLUCOSE 167 (H) 02/03/2024 0834   GLUCOSE 249 (H) 04/02/2014 0427   BUN 70 (H) 02/03/2024 0834   BUN 76 (HH) 07/09/2023 1534   BUN 19 (H) 04/02/2014 0427   CREATININE 3.34 (H) 02/03/2024 0834   CREATININE 3.36 (H) 04/24/2023 0830   CREATININE 0.75 04/02/2014 0427   CALCIUM  8.1 (L) 02/03/2024 0834   CALCIUM  8.1 (L) 04/02/2014 0427   PROT  5.5 (L) 01/29/2024 2357   PROT 6.7 04/01/2014 1117   ALBUMIN 2.6 (L) 01/29/2024 2357   ALBUMIN 3.1 (L) 07/09/2023 1534   ALBUMIN 3.3 (L) 04/01/2014 1117   AST 28 01/29/2024 2357   AST 19 04/24/2023 0830   ALT 17 01/29/2024 2357   ALT 16 04/24/2023 0830   ALT 19 04/01/2014 1117   ALKPHOS 109 01/29/2024 2357   ALKPHOS 83 04/01/2014 1117   BILITOT 0.6 01/29/2024 2357   BILITOT 0.2 04/24/2023 0830   EGFR 10 (L) 07/09/2023 1534   GFRNONAA 15 (L) 02/03/2024 0834   GFRNONAA 15 (L) 04/24/2023 0830   GFRNONAA >60 04/02/2014 0427   GFRNONAA 46 (L) 11/11/2012 0834     No results found.     Assessment & Plan:   1. S/P arteriovenous (AV) graft placement (Primary) At this time the patient is currently not on dialysis. If she needed to begin dialysis, she currently has adequate access for use. The very small wound at the incision line is well healed. She now has a noted seroma at the brachial connection site.  Will plan on having her return in about 8 weeks to evaluate the area.   2. Primary hypertension Continue antihypertensive medications as already ordered, these medications have been reviewed and there are no changes at this time.  3. Hyperlipidemia, unspecified hyperlipidemia type Continue statin  as ordered and reviewed, no changes at this time  4. Type 2 diabetes mellitus with stage 5 chronic kidney disease not on chronic dialysis, with long-term current use of insulin  (HCC) Continue hypoglycemic medications as already ordered, these medications have been reviewed and there are no changes at this time.  Hgb A1C to be monitored as already arranged by primary service   Current Outpatient Medications on File Prior to Visit  Medication Sig Dispense Refill   ACCU-CHEK GUIDE TEST test strip 3 (three) times daily.     aspirin  EC 81 MG tablet Take 81 mg by mouth in the morning. Swallow whole.     atorvastatin  (LIPITOR ) 80 MG tablet Take 1 tablet (80 mg total) by mouth daily. 30 tablet 5    calcium  acetate (PHOSLO) 667 MG capsule Take 667 mg by mouth.     carboxymethylcellulose (REFRESH PLUS) 0.5 % SOLN Place 1 drop into both eyes in the morning and at bedtime.     CAREFINE PEN NEEDLES 32G X 4 MM MISC CAREFINE PEN NEEDLES 32G X 4 MM     carvedilol  (COREG ) 25 MG tablet Take 1 tablet (25 mg total) by mouth 2 (two) times daily. 180 tablet 3   Continuous Glucose Receiver (DEXCOM G7 RECEIVER) DEVI Use as instructed with G7 sensors     Continuous Glucose Sensor (DEXCOM G7 SENSOR) MISC Place one sensor on the skin every 10 days for use with Dexcom reader. 9 each 1   DULoxetine  (CYMBALTA ) 20 MG capsule TAKE 2 CAPSULES (40 MG TOTAL) BY MOUTH 2 (TWO) TIMES DAILY. 360 capsule 0   Glucagon 3 MG/DOSE POWD Use 1 spray in 1 nostril for severe hypoglycemia, as per package instructions     HUMALOG  KWIKPEN 100 UNIT/ML KwikPen Inject 8 Units into the skin 3 (three) times daily after meals. (Patient taking differently: Inject 4 Units into the skin 3 (three) times daily after meals. PER SLIDING SCALE) 15 mL 11   hydrALAZINE  (APRESOLINE ) 100 MG tablet Take 1 tablet (100 mg total) by mouth 3 (three) times daily. 90 tablet 5   isosorbide  dinitrate (ISORDIL ) 30 MG tablet Take 1 tablet (30 mg total) by mouth 3 (three) times daily. 90 tablet 5   ketoconazole  (NIZORAL ) 2 % cream APPLY TO FEET TOPICALLY IN THE MORNING AND AT BED DAILY 60 g 1   LANTUS  SOLOSTAR 100 UNIT/ML Solostar Pen Inject 4 Units into the skin at bedtime.     torsemide  (DEMADEX ) 20 MG tablet Take 4 tablets (80 mg total) by mouth daily. Take 4 20 mg tabs = 80 mg 120 tablet 5   traMADol  (ULTRAM ) 50 MG tablet Take 1 tablet (50 mg total) by mouth every 6 (six) hours as needed. 20 tablet 0   Vitamin D, Ergocalciferol, (DRISDOL) 1.25 MG (50000 UNIT) CAPS capsule Take 50,000 Units by mouth.     Current Facility-Administered Medications on File Prior to Visit  Medication Dose Route Frequency Provider Last Rate Last Admin   heparin  lock flush 100  UNIT/ML injection            heparin  lock flush 100 unit/mL  500 Units Intravenous Once Yu, Zhou, MD        There are no Patient Instructions on file for this visit. No follow-ups on file.   Gwendlyn JONELLE Shank, NP

## 2024-03-06 ENCOUNTER — Ambulatory Visit (INDEPENDENT_AMBULATORY_CARE_PROVIDER_SITE_OTHER): Admitting: Nurse Practitioner

## 2024-03-07 ENCOUNTER — Telehealth: Payer: Self-pay | Admitting: Physician Assistant

## 2024-03-07 NOTE — Telephone Encounter (Signed)
 Copied from CRM [id number]. Topic: Medicare AWV >> Mar 07, 2024 10:28 AM Nathanel DEL wrote: Reason for CRM: Called LVM 03/07/2024 to schedule AWV. Please schedule Virtual or Telehealth visits ONLY  Nathanel Paschal; Care Guide Ambulatory Clinical Support [place] [facility] Direct Dial: [phone]

## 2024-03-10 ENCOUNTER — Other Ambulatory Visit: Payer: Self-pay | Admitting: Physician Assistant

## 2024-03-10 DIAGNOSIS — R55 Syncope and collapse: Secondary | ICD-10-CM | POA: Diagnosis not present

## 2024-03-10 DIAGNOSIS — Z043 Encounter for examination and observation following other accident: Secondary | ICD-10-CM | POA: Diagnosis not present

## 2024-03-10 DIAGNOSIS — N184 Chronic kidney disease, stage 4 (severe): Secondary | ICD-10-CM | POA: Diagnosis not present

## 2024-03-10 DIAGNOSIS — R9431 Abnormal electrocardiogram [ECG] [EKG]: Secondary | ICD-10-CM | POA: Diagnosis not present

## 2024-03-10 DIAGNOSIS — R42 Dizziness and giddiness: Secondary | ICD-10-CM | POA: Diagnosis not present

## 2024-03-10 DIAGNOSIS — D631 Anemia in chronic kidney disease: Secondary | ICD-10-CM | POA: Diagnosis not present

## 2024-03-10 DIAGNOSIS — E1151 Type 2 diabetes mellitus with diabetic peripheral angiopathy without gangrene: Secondary | ICD-10-CM | POA: Diagnosis not present

## 2024-03-10 DIAGNOSIS — I509 Heart failure, unspecified: Secondary | ICD-10-CM | POA: Diagnosis not present

## 2024-03-10 DIAGNOSIS — Z79899 Other long term (current) drug therapy: Secondary | ICD-10-CM | POA: Diagnosis not present

## 2024-03-10 DIAGNOSIS — I130 Hypertensive heart and chronic kidney disease with heart failure and stage 1 through stage 4 chronic kidney disease, or unspecified chronic kidney disease: Secondary | ICD-10-CM | POA: Diagnosis not present

## 2024-03-10 DIAGNOSIS — Z87891 Personal history of nicotine dependence: Secondary | ICD-10-CM | POA: Diagnosis not present

## 2024-03-10 DIAGNOSIS — I5032 Chronic diastolic (congestive) heart failure: Secondary | ICD-10-CM | POA: Diagnosis not present

## 2024-03-10 DIAGNOSIS — W19XXXA Unspecified fall, initial encounter: Secondary | ICD-10-CM | POA: Diagnosis not present

## 2024-03-10 DIAGNOSIS — Z7982 Long term (current) use of aspirin: Secondary | ICD-10-CM | POA: Diagnosis not present

## 2024-03-10 DIAGNOSIS — M25569 Pain in unspecified knee: Secondary | ICD-10-CM | POA: Diagnosis not present

## 2024-03-10 DIAGNOSIS — E785 Hyperlipidemia, unspecified: Secondary | ICD-10-CM | POA: Diagnosis not present

## 2024-03-10 DIAGNOSIS — Z794 Long term (current) use of insulin: Secondary | ICD-10-CM | POA: Diagnosis not present

## 2024-03-10 DIAGNOSIS — N2581 Secondary hyperparathyroidism of renal origin: Secondary | ICD-10-CM | POA: Diagnosis not present

## 2024-03-10 DIAGNOSIS — E1122 Type 2 diabetes mellitus with diabetic chronic kidney disease: Secondary | ICD-10-CM | POA: Diagnosis not present

## 2024-03-10 DIAGNOSIS — R001 Bradycardia, unspecified: Secondary | ICD-10-CM | POA: Diagnosis not present

## 2024-03-10 DIAGNOSIS — I2510 Atherosclerotic heart disease of native coronary artery without angina pectoris: Secondary | ICD-10-CM | POA: Diagnosis not present

## 2024-03-10 DIAGNOSIS — N179 Acute kidney failure, unspecified: Secondary | ICD-10-CM | POA: Diagnosis not present

## 2024-03-10 DIAGNOSIS — Z888 Allergy status to other drugs, medicaments and biological substances status: Secondary | ICD-10-CM | POA: Diagnosis not present

## 2024-03-11 ENCOUNTER — Other Ambulatory Visit: Payer: Self-pay

## 2024-03-11 DIAGNOSIS — B353 Tinea pedis: Secondary | ICD-10-CM

## 2024-03-11 DIAGNOSIS — R55 Syncope and collapse: Secondary | ICD-10-CM | POA: Diagnosis not present

## 2024-03-11 MED ORDER — KETOCONAZOLE 2 % EX CREA: TOPICAL_CREAM | Freq: Two times a day (BID) | CUTANEOUS | 0 refills | Status: DC

## 2024-03-11 NOTE — Telephone Encounter (Signed)
 Requested medications are due for refill today.  yes  Requested medications are on the active medications list.  yes  Last refill. 12/25/2023 #360 0 rf  Future visit scheduled.   yes  Notes to clinic.  Abnormal labs    Requested Prescriptions  Pending Prescriptions Disp Refills   DULoxetine  (CYMBALTA ) 20 MG capsule [Pharmacy Med Name: DULOXETINE  HCL DR 20 MG CAP] 360 capsule 0    Sig: TAKE 2 CAPSULES (40 MG TOTAL) BY MOUTH 2 (TWO) TIMES DAILY.     Psychiatry: Antidepressants - SNRI - duloxetine  Failed - 03/11/2024 12:45 PM      Failed - Cr in normal range and within 360 days    Creatinine  Date Value Ref Range Status  04/24/2023 3.36 (H) 0.44 - 1.00 mg/dL Final  89/84/7984 9.24 0.60 - 1.30 mg/dL Final   Creatinine, Ser  Date Value Ref Range Status  02/03/2024 3.34 (H) 0.44 - 1.00 mg/dL Final   Creatinine, Urine  Date Value Ref Range Status  05/26/2023 96 mg/dL Final    Comment:    Performed at [facility], [address] [place], [place] [address]         Failed - eGFR is 30 or above and within 360 days    EGFR (African American)  Date Value Ref Range Status  04/02/2014 >60 >66mL/min Final  11/11/2012 53 (L)  Final   GFR calc Af Amer  Date Value Ref Range Status  08/10/2020 57 (L) >59 mL/min/1.73 Final    Comment:    **In accordance with recommendations from the NKF-ASN Task force,**   [facility] is in the process of updating its eGFR calculation to the   2021 CKD-EPI creatinine equation that estimates kidney function   without a race variable.    EGFR (Non-African Amer.)  Date Value Ref Range Status  04/02/2014 >60 >60mL/min Final    Comment:    eGFR values <17mL/min/1.73 m2 may be an indication of chronic kidney disease (CKD). Calculated eGFR, using the MRDR Study equation, is useful in  patients with stable renal function. The eGFR calculation will not be reliable in acutely ill patients when serum creatinine is changing rapidly. It is not useful  in patients on dialysis. The eGFR calculation may not be applicable to patients at the low and high extremes of body sizes, pregnant women, and vegetarians.   11/11/2012 46 (L)  Final    Comment:    eGFR values <85mL/min/1.73 m2 may be an indication of chronic kidney disease (CKD). Calculated eGFR is useful in patients with stable renal function. The eGFR calculation will not be reliable in acutely ill patients when serum creatinine is changing rapidly. It is not useful in  patients on dialysis. The eGFR calculation may not be applicable to patients at the low and high extremes of body sizes, pregnant women, and vegetarians.    GFR, Estimated  Date Value Ref Range Status  02/03/2024 15 (L) >60 mL/min Final    Comment:    (NOTE) Calculated using the CKD-EPI Creatinine Equation (2021)   04/24/2023 15 (L) >60 mL/min Final    Comment:    (NOTE) Calculated using the CKD-EPI Creatinine Equation (2021)    eGFR  Date Value Ref Range Status  07/09/2023 10 (L) >59 mL/min/1.73 Final         Failed - Last BP in normal range    BP Readings from Last 1 Encounters:  02/29/24 (!) 162/71         Passed -  Completed PHQ-2 or PHQ-9 in the last 360 days      Passed - Valid encounter within last 6 months    Recent Outpatient Visits           1 month ago Chronic systolic congestive heart failure [facility])   [place] [facility] at [facility], [place]   3 months ago Encounter for screening mammogram for breast cancer    Primary Care & Sports Medicine at [facility], [place]   3 months ago Type 2 diabetes mellitus with stage 5 chronic kidney disease not on chronic dialysis, with long-term current use of insulin  [facility])   [facility] at [facility], [place], [place]

## 2024-03-11 NOTE — Telephone Encounter (Signed)
 Please review.  KP

## 2024-03-17 ENCOUNTER — Encounter: Payer: Self-pay | Admitting: Oncology

## 2024-03-18 ENCOUNTER — Emergency Department

## 2024-03-18 ENCOUNTER — Emergency Department
Admission: EM | Admit: 2024-03-18 | Discharge: 2024-03-18 | Disposition: A | Discharge status: Home / Self Care | Attending: Emergency Medicine | Admitting: Emergency Medicine

## 2024-03-18 ENCOUNTER — Other Ambulatory Visit: Payer: Self-pay

## 2024-03-18 DIAGNOSIS — E1122 Type 2 diabetes mellitus with diabetic chronic kidney disease: Secondary | ICD-10-CM | POA: Diagnosis not present

## 2024-03-18 DIAGNOSIS — N184 Chronic kidney disease, stage 4 (severe): Secondary | ICD-10-CM | POA: Insufficient documentation

## 2024-03-18 DIAGNOSIS — M79605 Pain in left leg: Secondary | ICD-10-CM | POA: Diagnosis not present

## 2024-03-18 DIAGNOSIS — I5032 Chronic diastolic (congestive) heart failure: Secondary | ICD-10-CM | POA: Insufficient documentation

## 2024-03-18 DIAGNOSIS — M7732 Calcaneal spur, left foot: Secondary | ICD-10-CM | POA: Diagnosis not present

## 2024-03-18 DIAGNOSIS — W19XXXA Unspecified fall, initial encounter: Secondary | ICD-10-CM | POA: Diagnosis not present

## 2024-03-18 DIAGNOSIS — M19072 Primary osteoarthritis, left ankle and foot: Secondary | ICD-10-CM | POA: Diagnosis not present

## 2024-03-18 DIAGNOSIS — Z85118 Personal history of other malignant neoplasm of bronchus and lung: Secondary | ICD-10-CM | POA: Insufficient documentation

## 2024-03-18 DIAGNOSIS — S93402A Sprain of unspecified ligament of left ankle, initial encounter: Secondary | ICD-10-CM | POA: Insufficient documentation

## 2024-03-18 DIAGNOSIS — M25572 Pain in left ankle and joints of left foot: Secondary | ICD-10-CM | POA: Diagnosis not present

## 2024-03-18 DIAGNOSIS — S99912A Unspecified injury of left ankle, initial encounter: Secondary | ICD-10-CM | POA: Diagnosis present

## 2024-03-18 LAB — CBC WITH DIFFERENTIAL/PLATELET
Abs Immature Granulocytes: 0.01 K/uL (ref 0.00–0.07)
Basophils Absolute: 0 K/uL (ref 0.0–0.1)
Basophils Relative: 1 %
Eosinophils Absolute: 0.3 K/uL (ref 0.0–0.5)
Eosinophils Relative: 7 %
HCT: 29.1 % — ABNORMAL LOW (ref 36.0–46.0)
Hemoglobin: 8.9 g/dL — ABNORMAL LOW (ref 12.0–15.0)
Immature Granulocytes: 0 %
Lymphocytes Relative: 20 %
Lymphs Abs: 0.8 K/uL (ref 0.7–4.0)
MCH: 29.4 pg (ref 26.0–34.0)
MCHC: 30.6 g/dL (ref 30.0–36.0)
MCV: 96 fL (ref 80.0–100.0)
Monocytes Absolute: 0.4 K/uL (ref 0.1–1.0)
Monocytes Relative: 10 %
Neutro Abs: 2.7 K/uL (ref 1.7–7.7)
Neutrophils Relative %: 62 %
Platelets: 224 K/uL (ref 150–400)
RBC: 3.03 MIL/uL — ABNORMAL LOW (ref 3.87–5.11)
RDW: 15.6 % — ABNORMAL HIGH (ref 11.5–15.5)
WBC: 4.3 K/uL (ref 4.0–10.5)
nRBC: 0 % (ref 0.0–0.2)

## 2024-03-18 LAB — COMPREHENSIVE METABOLIC PANEL WITH GFR
ALT: 17 U/L (ref 0–44)
AST: 20 U/L (ref 15–41)
Albumin: 2.8 g/dL — ABNORMAL LOW (ref 3.5–5.0)
Alkaline Phosphatase: 121 U/L (ref 38–126)
Anion gap: 9 (ref 5–15)
BUN: 67 mg/dL — ABNORMAL HIGH (ref 8–23)
CO2: 22 mmol/L (ref 22–32)
Calcium: 8.2 mg/dL — ABNORMAL LOW (ref 8.9–10.3)
Chloride: 109 mmol/L (ref 98–111)
Creatinine, Ser: 3.71 mg/dL — ABNORMAL HIGH (ref 0.44–1.00)
GFR, Estimated: 13 mL/min — ABNORMAL LOW (ref >60)
Glucose, Bld: 136 mg/dL — ABNORMAL HIGH (ref 70–99)
Potassium: 3.2 mmol/L — ABNORMAL LOW (ref 3.5–5.1)
Sodium: 140 mmol/L (ref 135–145)
Total Bilirubin: 0.5 mg/dL (ref 0.0–1.2)
Total Protein: 6 g/dL — ABNORMAL LOW (ref 6.5–8.1)

## 2024-03-18 MED ORDER — OXYCODONE HCL 5 MG PO TABS
5.0000 mg | ORAL_TABLET | Freq: Once | ORAL | Status: AC
  Administered 2024-03-18: 5 mg via ORAL
  Filled 2024-03-18: qty 1

## 2024-03-18 MED ORDER — TRAMADOL HCL 50 MG PO TABS: 50.0000 mg | ORAL_TABLET | Freq: Three times a day (TID) | ORAL | 0 refills | Status: DC | PRN

## 2024-03-18 NOTE — ED Triage Notes (Signed)
 See first nurse note.

## 2024-03-18 NOTE — Discharge Instructions (Signed)
 Please keep your scheduled follow-up appointment with podiatry.  If your symptoms change or worsen in the meantime, please see primary care or return to the emergency department.  Rest, ice off-and-on 20 minutes/h over the area and elevate.  Wear the brace when you are up moving around.  You may take it off to sleep or when you are at rest.

## 2024-03-18 NOTE — ED Provider Triage Note (Signed)
 Emergency Medicine Provider Triage Evaluation Note  [name] [name] , a 68 y.o. female  was evaluated in triage.  Pt complains of left foot and ankle pain after fall on Tuesday. She was evaluated at [facility] and had negative x-rays. Pain and swelling worse.  Physical Exam  BP (!) 158/70   Pulse 62   Temp 97.7 F (36.5 C)   Resp 18   SpO2 97%  Gen:   Awake, no distress   Resp:  Normal effort  MSK:   Moves extremities without difficulty  Other:    Medical Decision Making  Medically screening exam initiated at 5:48 PM.  Appropriate orders placed.  [name] [name] was informed that the remainder of the evaluation will be completed by another provider, this initial triage assessment does not replace that evaluation, and the importance of remaining in the ED until their evaluation is complete.  Repeat imaging ordered.   [name] Kirk NOVAK, FNP 03/18/24 1750

## 2024-03-18 NOTE — ED Provider Notes (Signed)
 [facility] Provider Note    Event Date/Time   First MD Initiated Contact with Patient 03/18/24 1840     (approximate)   History   Ankle Pain   HPI  [name] [name] is a 68 y.o. female with history of type 2 diabetes, lung CA IIIAa status post left lower lobe lobectomy, chronic kidney disease stage IV, chronic diastolic heart failure, anemia of chronic disease and secondary hyperparathyroidism and as listed in EMR presents to the emergency department for treatment and evaluation of left lower leg and ankle pain and swelling.  She fell on Tuesday and was evaluated in the ER at [facility] and was subsequently discharged.  She states that they did not apply splint or boot.  Pain and swelling has continued despite symptomatic care.  No new injury.  She has a follow-up appointment with podiatry on October 8.     Physical Exam    Vitals:   03/18/24 1747  BP: (!) 158/70  Pulse: 62  Resp: 18  Temp: 97.7 F (36.5 C)  SpO2: 97%    General: Awake, no distress.  CV:  Good peripheral perfusion.  Resp:  Normal effort.  Abd:  No distention.  Other:  Diffuse, nonpitting edema just above the ankle to toes of the left lower extremity.  Dorsalis pedal pulses 2+.  Temperature, motor and sensory function of the toes is intact.  Patient able to demonstrate limited flexion, extension, inversion and eversion secondary to pain and swelling.  No deformity noted.   ED Results / Procedures / Treatments   Labs (all labs ordered are listed, but only abnormal results are displayed)  Labs Reviewed  CBC WITH DIFFERENTIAL/PLATELET - Abnormal; Notable for the following components:      Result Value   RBC 3.03 (*)    Hemoglobin 8.9 (*)    HCT 29.1 (*)    RDW 15.6 (*)    All other components within normal limits  COMPREHENSIVE METABOLIC PANEL WITH GFR - Abnormal; Notable for the following components:   Potassium 3.2 (*)    Glucose, Bld 136 (*)    BUN 67 (*)    Creatinine,  Ser 3.71 (*)    Calcium  8.2 (*)    Total Protein 6.0 (*)    Albumin 2.8 (*)    GFR, Estimated 13 (*)    All other components within normal limits     EKG  Not indicated   RADIOLOGY  Image and radiology report reviewed and interpreted by me. Radiology report consistent with the same.  Image of the left ankle shows no acute fracture or dislocation.  Mild soft tissue swelling noted.  PROCEDURES:  Critical Care performed: No  Procedures   MEDICATIONS ORDERED IN ED:  Medications  oxyCODONE  (Oxy IR/ROXICODONE ) immediate release tablet 5 mg (5 mg Oral Given 03/18/24 1915)     IMPRESSION / MDM / ASSESSMENT AND PLAN / ED COURSE   I have reviewed the triage note and vital signs. Vital signs are stable.   Differential diagnosis includes, but is not limited to, ankle sprain, tib-fib fracture, foot fracture  Patient's presentation is most consistent with acute illness / injury with system symptoms.  68 year old female presenting to the emergency department for treatment and evaluation of persistent pain in the left lower leg, ankle, and foot after a fall on September 22.  She was evaluated afterward at an outside hospital and discharged home after reassuring exam.  No improvement in symptoms.  See HPI for further  details.  Diffuse swelling of the lower leg, ankle, and foot is noted.  Temperature, motor, and sensory function is intact.  Repeat imaging today is negative for fracture.  Lab studies today are at patient's baseline.  Plan will be to put her in ASO brace and have her keep her scheduled follow-up appointment with orthopedics.  Short course of pain medication will be prescribed as well.  She is to see her primary care provider, specialist, or return to the emergency department for symptoms that change or worsen      FINAL CLINICAL IMPRESSION(S) / ED DIAGNOSES   Final diagnoses:  Sprain of left ankle, unspecified ligament, initial encounter     Rx / DC Orders    ED Discharge Orders          Ordered    traMADol  (ULTRAM ) 50 MG tablet  Every 8 hours PRN        03/18/24 1927             Note:  This document was prepared using Dragon voice recognition software and may include unintentional dictation errors.   Herlinda Kirk NOVAK, FNP 03/18/24 R[place]ALL    Levander Slate, MD 03/18/24 2212

## 2024-03-18 NOTE — ED Notes (Signed)
 Pt given DC instructions. Pt verbalized understanding of medication and follow up care. Pt assisted to wheelchair and taken from ED.

## 2024-03-18 NOTE — ED Triage Notes (Signed)
 First Nurse Note: Patient to ED via EMS from home for left leg pain/ swelling. Has pitting edema. R side below the knee amputation. Hx CHF, COPD. Has cough and fell on Tuesday-seen and dc'd at [facility]. Pain worse today with swelling. VS WNL

## 2024-03-19 ENCOUNTER — Telehealth: Payer: Self-pay | Admitting: Family

## 2024-03-19 NOTE — Progress Notes (Deleted)
 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   Echo 05/30/23: EF 25%, G1DD, severe RV dysfunction  Admitted 06/21/23 due to being volume overloaded. Recent admission prior to that from 12/9 - 06/18/23 (at that time new HF diagnosis, suspected mixed etiology) then did not pick up meds at DC. This January admission pBNP was 89k (131k prior). IV diuresed. BL effusions on CXR. Resumed home GDMT/HTN agents. DC at 115 lbs.   Admitted again a few days later 06/28/23 with hypoglycemia. Incorrect home insulin  administration, which was corrected in hospital. One dose of IV lasix  in hospital. AKI at admission which was improving at DC.   Admitted 07/10/23 with SOB/ cough. Had stopped taking insulin  due to her concern about hypoglycemia although glucose levels have been 400-500's. Cr 4.7 (from 3.8), BUN 76, glucose 324, Hgb 8.6 (around baseline), lipase 110, BHB <0.18, VBG 7.33/43, lactate 1.4, pro-BNP 4321. CXR stable. Given IVF with improvement of renal function. Endocrinology consulted.   Admitted 10/01/23 with HF exacerbation after running out of diuretics 4 days prior. + UDS for cocaine. proBNP was over 32,000 and troponins were 88 when she came in and peaked at 448 but then down-trended quickly. Did not have EKG findings of new ischemia. Cardiology consulted. IV diuresed and weaned to room air.    Echo 12/31/23: EF 50-55%, G2DD, mild MR, normal RV   Was in the ED 01/20/24 with pain and swelling of the left arm after recent AV graft placement. Surgical graft placement on January 10, 2024 by vascular surgery Dr. Marea. Patient  reports since then she has had gradually worsening pain and swelling of the area. Ultrasound negative, vascular consulted. Antibiotics given.   Admitted 01/29/24 with two weeks of progressive sob worse over the last 3-4 days before presentation.Received lasix  drip with transition to oral diuretics. Cardiology and nephrology consults obtained.   She presents today for her initial HF visit with a chief complaint of shortness of breath. Has associated moderate fatigue. Sleeping well on 1 pillow. Has a Right BKA. Denies chest pain, dizziness, edema, palpitations. Has fistula in left arm for future dialysis. Has not taken her medications yet today and admits that she occasionally forgets them.    Review of Systems: [y] = yes, [ ]  = no   General: Weight gain [ ] ; Weight loss [ ] ; Anorexia [ ] ; Fatigue [name] ]; Fever [ ] ; Chills [ ] ; Weakness [ ]   Cardiac: Chest pain/pressure [ ] ; Resting SOB [ ] ; Exertional SOB [name] ]; Orthopnea [ ] ; Pedal Edema [ ] ; Palpitations [ ] ; Syncope [ ] ; Presyncope [ ] ; Paroxysmal nocturnal dyspnea[ ]   Pulmonary: Cough [ ] ; Wheezing[ ] ; Hemoptysis[ ] ; Sputum [ ] ; Snoring [ ]   GI: Vomiting[ ] ; Dysphagia[ ] ; Melena[ ] ; Hematochezia [ ] ; Heartburn[ ] ; Abdominal pain [ ] ; Constipation [ ] ; Diarrhea [ ] ; BRBPR [ ]   GU: Hematuria[ ] ; Dysuria [ ] ; Nocturia[ ]   Vascular: Pain in legs with walking [ ] ; Pain in feet with lying flat [ ] ; Non-healing sores [ ] ; Stroke [ y]; TIA [ ] ; Slurred speech [ ] ;  Neuro: Headaches[ ] ; Vertigo[ ] ;  Seizures[ ] ; Paresthesias[ ] ;Blurred vision [ ] ; Diplopia [ ] ; Vision changes [ ]   Ortho/Skin: Arthritis [ ] ; Joint pain [ ] ; Muscle pain [ ] ; Joint swelling [ ] ; Back Pain [ ] ; Rash [ ]   Psych: Depression[ ] ; Anxiety[ ]   Heme: Bleeding problems [ ] ; Clotting disorders [ ] ; Anemia [ y]  Endocrine: Diabetes [ y]; Thyroid  dysfunction[ ]    Past Medical History:  Diagnosis Date   Adenocarcinoma of left lung (HCC)    a.) stage IIIA (pT1c, pN2, cM0) --> s/p LLL  lobectomy + 4 cycles of carbo/taxol  + 1 cycle atezolizumab  (discontinued for grade IV toxicity)   Anemia of chronic renal failure    Cerebral microvascular disease    CKD (chronic kidney disease), stage V (HCC)    Cocaine abuse (HCC)    Coronary artery disease    a.) LHC (NSTEMI) 05/30/2010: 10% pLCxm 20% pRCA-1, 100% pRCA-2 - med mgmt; b.) LHC 02/24/2020: CTA pRCA with good L-R collaterals, 50% mLAD, 80% OM1 - med mgmt   Depression    Diabetic foot ulcer (HCC) 10/03/2019   Dyspnea    Elevated LEFT hemidiaphragm    HFrEF (heart failure with reduced ejection fraction) (HCC) 02/16/2020   a.) Dx'd in setting of acute DVT/PE   Hyperlipidemia    Hypertension    Long-term use of aspirin  therapy    NSTEMI (non-ST elevated myocardial infarction) (HCC) 05/28/2010   a.) troponins were trended: 1.70 --> 3.30 --> 4.90 ng/mL; b.) LHC 05/30/2010: 10% pLCx, 20% pRCA-1, 100% pRCA-2 (chronic) with good collaterals - med mgmt   Peripheral artery disease    a.) 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   Systolic ejection murmur    T2DM (type 2 diabetes mellitus) (HCC)    Venous thromboembolism (VTE) 02/15/2020   a.) 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; CTA (+) for multiple RIGHT segmental/subsegment pulmonary emboli    Current Outpatient Medications  Medication Sig Dispense Refill   ACCU-CHEK GUIDE TEST test strip 3 (three) times daily.     aspirin  EC 81 MG tablet Take 81 mg by mouth in the morning. Swallow whole.     atorvastatin  (LIPITOR ) 80 MG tablet Take 1 tablet (80 mg total) by mouth daily. 30 tablet 5   calcium  acetate (PHOSLO) 667 MG capsule Take 667 mg by mouth.     carboxymethylcellulose (REFRESH PLUS) 0.5 % SOLN Place 1 drop into both eyes in the morning and at bedtime.     CAREFINE PEN NEEDLES 32G X 4 MM MISC CAREFINE PEN NEEDLES 32G X 4 MM     carvedilol  (COREG ) 25 MG tablet Take 1 tablet (25 mg total) by mouth 2 (two) times daily. 180 tablet 3   Continuous Glucose Receiver (DEXCOM G7 RECEIVER) DEVI Use  as instructed with G7 sensors     Continuous Glucose Sensor (DEXCOM G7 SENSOR) MISC Place one sensor on the skin every 10 days for use with Dexcom reader. 9 each 1   DULoxetine  (CYMBALTA ) 20 MG capsule TAKE 2 CAPSULES (40 MG TOTAL) BY MOUTH 2 (TWO) TIMES DAILY. 360 capsule 0   Glucagon 3 MG/DOSE POWD Use 1 spray in 1 nostril for severe hypoglycemia, as per package instructions     HUMALOG  KWIKPEN 100 UNIT/ML KwikPen Inject 8 Units into the skin 3 (three) times daily after meals. (Patient taking differently: Inject 4 Units into the skin 3 (three) times daily after meals. PER SLIDING SCALE) 15 mL 11   hydrALAZINE  (APRESOLINE ) 100 MG tablet Take 1 tablet (100 mg total) by  mouth 3 (three) times daily. 90 tablet 5   isosorbide  dinitrate (ISORDIL ) 30 MG tablet Take 1 tablet (30 mg total) by mouth 3 (three) times daily. 90 tablet 5   ketoconazole  (NIZORAL ) 2 % cream Apply topically 2 (two) times daily. 60 g 0   LANTUS  SOLOSTAR 100 UNIT/ML Solostar Pen Inject 4 Units into the skin at bedtime.     torsemide  (DEMADEX ) 20 MG tablet Take 4 tablets (80 mg total) by mouth daily. Take 4 20 mg tabs = 80 mg 120 tablet 5   traMADol  (ULTRAM ) 50 MG tablet Take 1 tablet (50 mg total) by mouth every 8 (eight) hours as needed. 12 tablet 0   Vitamin D, Ergocalciferol, (DRISDOL) 1.25 MG (50000 UNIT) CAPS capsule Take 50,000 Units by mouth.     No current facility-administered medications for this visit.   Facility-Administered Medications Ordered in Other Visits  Medication Dose Route Frequency Provider Last Rate Last Admin   heparin  lock flush 100 UNIT/ML injection            heparin  lock flush 100 unit/mL  500 Units Intravenous Once Yu, Zhou, MD        Allergies  Allergen Reactions   Lisinopril Swelling and Other (See Comments)    Recurrent AKI and hyperkalemia when initiation attempted. Other Reaction(s): Facial edema      Social History   Socioeconomic History   Marital status: Married    Spouse name:  Not on file   Number of children: Not on file   Years of education: Not on file   Highest education level: Not on file  Occupational History   Not on file  Tobacco Use   Smoking status: Former    Current packs/day: 0.00    Types: Cigars, Cigarettes    Quit date: 01/17/2021    Years since quitting: 3.1   Smokeless tobacco: Never  Vaping Use   Vaping status: Never Used  Substance and Sexual Activity   Alcohol  use: Never   Drug use: Not Currently   Sexual activity: Not Currently  Other Topics Concern   Not on file  Social History Narrative   Not on file   Social Drivers of Health   Financial Resource Strain: Low Risk  (06/29/2023)   Received from [facility]   Overall Financial Resource Strain (CARDIA)    Difficulty of Paying Living Expenses: Not hard at all  Food Insecurity: No Food Insecurity (01/30/2024)   Hunger Vital Sign    Worried About Running Out of Food in the Last Year: Never true    Ran Out of Food in the Last Year: Never true  Transportation Needs: No Transportation Needs (01/30/2024)   PRAPARE - Administrator, Civil Service (Medical): No    Lack of Transportation (Non-Medical): No  Physical Activity: Not on file  Stress: Stress Concern Present (12/15/2021)   [employer] of Occupational Health - Occupational Stress Questionnaire    Feeling of Stress : To some extent  Social Connections: Moderately Integrated (01/30/2024)   Social Connection and Isolation Panel    Frequency of Communication with Friends and Family: More than three times a week    Frequency of Social Gatherings with Friends and Family: Once a week    Attends Religious Services: More than 4 times per year    Active Member of [employer] or Organizations: Yes    Attends Banker Meetings: More than 4 times per year    Marital Status: Widowed  Intimate Partner Violence: Not At Risk (01/30/2024)   Humiliation, Afraid, Rape, and Kick questionnaire    Fear of  Current or Ex-Partner: No    Emotionally Abused: No    Physically Abused: No    Sexually Abused: No      Family History  Problem Relation Age of Onset   Heart disease Mother    Diabetes Mother    Heart disease Father    Diabetes Father    There were no vitals filed for this visit.  Wt Readings from Last 3 Encounters:  03/18/24 114 lb (51.7 kg)  02/29/24 114 lb (51.7 kg)  02/14/24 117 lb (53.1 kg)   Lab Results  Component Value Date   CREATININE 3.71 (H) 03/18/2024   CREATININE 3.34 (H) 02/03/2024   CREATININE 3.59 (H) 02/02/2024    PHYSICAL EXAM:  General: Well appearing female in wheelchair.  Cor: No JVD. Regular rhythm, rate.  Lungs: clear Abdomen: soft, nontender, nondistended. Extremities: 1+ pitting edema left lower leg. Prosthesis on right leg Neuro:. Affect pleasant   ECG: not done   ASSESSMENT & PLAN:  1: ICM with preserved ejection fraction- - multivessel CAD - NYHA class III - euvolemic - weighing daily - Echo 05/30/23: EF 25%, G1DD, severe RV dysfunction - Echo 12/31/23: EF 50-55%, G2DD, mild MR, normal RV  - saw [facility] cardiology 06/25; returns 09/25 - increase carvedilol  to 25mg  BID for BP - continue torsemide  80mg  daily - CKD prohibits MRA/ ARNi/ ARB/ SGLT2 - get compression sock and wear it daily on her left leg - elevate legs when sitting for long periods of time - RN nurse navigator reviewed HF information with patient - BNP 01/29/24 was 3838.7  2: HTN- - BP 188/81 but hasn't taken meds yet today. She is interested in pill packing so will send RX's to [facility] pharmacy for this.  - continue hydralazine / isordil  TID - saw PCP [name] [name]) 08/25 - BMET 02/07/24 reviewed: sodium 135, potassium 4.7, creatinine 3.63 & GFR 13  3: T2DM- - A1c 02/01/24 was 7.3% - to see endocrinology (09/25)  4: PVD- - right BKA 2021 - saw vascular [name] [name]) 06/25  5: CKD- - fistula placed 07/25 for future dialysis - saw nephrology [name] [name]) 08/25  6:  Hyperlipidemia- - LDL 08/31/23 was 122 - continue atorvastatin  80mg  daily   Return in 1 month, sooner if needed.   Ellouise DELENA Class, FNP 03/19/24

## 2024-03-19 NOTE — Telephone Encounter (Signed)
 Called to confirm/remind patient of their appointment at the [facility] on 03/20/24.  Appt in question trying to find a ride   Appointment:   [] Confirmed  [] Left mess   [] No answer/No voice mail  [] VM Full/unable to leave message  [] Phone not in service  Patient reminded to bring all medications and/or complete list.  Confirmed patient has transportation. Gave directions, instructed to utilize valet parking.

## 2024-03-20 ENCOUNTER — Encounter: Admitting: Family

## 2024-03-20 ENCOUNTER — Telehealth: Payer: Self-pay | Admitting: Family

## 2024-03-20 NOTE — Telephone Encounter (Signed)
 Patient did not show for her Heart Failure Clinic appointment on 03/20/24.

## 2024-03-21 ENCOUNTER — Encounter: Payer: Self-pay | Admitting: Oncology

## 2024-03-27 ENCOUNTER — Ambulatory Visit: Admitting: Podiatry

## 2024-03-28 ENCOUNTER — Telehealth: Payer: Self-pay

## 2024-03-28 NOTE — Telephone Encounter (Signed)
 Pt called to r/s missed appt. It looks like her appt was 10/2 for heart care. I told pt she will have to call their dept to r.s that appt. Pt understood.

## 2024-03-31 ENCOUNTER — Encounter: Payer: Self-pay | Admitting: Oncology

## 2024-04-07 ENCOUNTER — Ambulatory Visit: Payer: Self-pay

## 2024-04-07 ENCOUNTER — Other Ambulatory Visit: Payer: Self-pay

## 2024-04-08 NOTE — Progress Notes (Deleted)
 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   Echo 05/30/23: EF 25%, G1DD, severe RV dysfunction  Admitted 06/21/23 due to being volume overloaded. Recent admission prior to that from 12/9 - 06/18/23 (at that time new HF diagnosis, suspected mixed etiology) then did not pick up meds at DC. This January admission pBNP was 89k (131k prior). IV diuresed. BL effusions on CXR. Resumed home GDMT/HTN agents. DC at 115 lbs.   Admitted again a few days later 06/28/23 with hypoglycemia. Incorrect home insulin  administration, which was corrected in hospital. One dose of IV lasix  in hospital. AKI at admission which was improving at DC.   Admitted 07/10/23 with SOB/ cough. Had stopped taking insulin  due to her concern about hypoglycemia although glucose levels have been 400-500's. Cr 4.7 (from 3.8), BUN 76, glucose 324, Hgb 8.6 (around baseline), lipase 110, BHB <0.18, VBG 7.33/43, lactate 1.4, pro-BNP 4321. CXR stable. Given IVF with improvement of renal function. Endocrinology consulted.   Admitted 10/01/23 with HF exacerbation after running out of diuretics 4 days prior. + UDS for cocaine. proBNP was over 32,000 and troponins were 88 when she came in and peaked at 448 but then down-trended quickly. Did not have EKG findings of new ischemia. Cardiology consulted. IV diuresed and weaned to room air.    Echo 12/31/23: EF 50-55%, G2DD, mild MR, normal RV   Was in the ED 01/20/24 with pain and swelling of the left arm after recent AV graft placement. Surgical graft placement on January 10, 2024 by vascular surgery Dr. Marea. Patient  reports since then she has had gradually worsening pain and swelling of the area. Ultrasound negative, vascular consulted. Antibiotics given.   Admitted 01/29/24 with two weeks of progressive sob worse over the last 3-4 days before presentation.Received lasix  drip with transition to oral diuretics. Cardiology and nephrology consults obtained.   She presents today for her initial HF visit with a chief complaint of shortness of breath. Has associated moderate fatigue. Sleeping well on 1 pillow. Has a Right BKA. Denies chest pain, dizziness, edema, palpitations. Has fistula in left arm for future dialysis. Has not taken her medications yet today and admits that she occasionally forgets them.    Review of Systems: [y] = yes, [ ]  = no   General: Weight gain [ ] ; Weight loss [ ] ; Anorexia [ ] ; Fatigue [name] ]; Fever [ ] ; Chills [ ] ; Weakness [ ]   Cardiac: Chest pain/pressure [ ] ; Resting SOB [ ] ; Exertional SOB [name] ]; Orthopnea [ ] ; Pedal Edema [ ] ; Palpitations [ ] ; Syncope [ ] ; Presyncope [ ] ; Paroxysmal nocturnal dyspnea[ ]   Pulmonary: Cough [ ] ; Wheezing[ ] ; Hemoptysis[ ] ; Sputum [ ] ; Snoring [ ]   GI: Vomiting[ ] ; Dysphagia[ ] ; Melena[ ] ; Hematochezia [ ] ; Heartburn[ ] ; Abdominal pain [ ] ; Constipation [ ] ; Diarrhea [ ] ; BRBPR [ ]   GU: Hematuria[ ] ; Dysuria [ ] ; Nocturia[ ]   Vascular: Pain in legs with walking [ ] ; Pain in feet with lying flat [ ] ; Non-healing sores [ ] ; Stroke [ y]; TIA [ ] ; Slurred speech [ ] ;  Neuro: Headaches[ ] ; Vertigo[ ] ;  Seizures[ ] ; Paresthesias[ ] ;Blurred vision [ ] ; Diplopia [ ] ; Vision changes [ ]   Ortho/Skin: Arthritis [ ] ; Joint pain [ ] ; Muscle pain [ ] ; Joint swelling [ ] ; Back Pain [ ] ; Rash [ ]   Psych: Depression[ ] ; Anxiety[ ]   Heme: Bleeding problems [ ] ; Clotting disorders [ ] ; Anemia [ y]  Endocrine: Diabetes [ y]; Thyroid  dysfunction[ ]    Past Medical History:  Diagnosis Date   Adenocarcinoma of left lung (HCC)    a.) stage IIIA (pT1c, pN2, cM0) --> s/p LLL  lobectomy + 4 cycles of carbo/taxol  + 1 cycle atezolizumab  (discontinued for grade IV toxicity)   Anemia of chronic renal failure    Cerebral microvascular disease    CKD (chronic kidney disease), stage V (HCC)    Cocaine abuse (HCC)    Coronary artery disease    a.) LHC (NSTEMI) 05/30/2010: 10% pLCxm 20% pRCA-1, 100% pRCA-2 - med mgmt; b.) LHC 02/24/2020: CTA pRCA with good L-R collaterals, 50% mLAD, 80% OM1 - med mgmt   Depression    Diabetic foot ulcer (HCC) 10/03/2019   Dyspnea    Elevated LEFT hemidiaphragm    HFrEF (heart failure with reduced ejection fraction) (HCC) 02/16/2020   a.) Dx'd in setting of acute DVT/PE   Hyperlipidemia    Hypertension    Long-term use of aspirin  therapy    NSTEMI (non-ST elevated myocardial infarction) (HCC) 05/28/2010   a.) troponins were trended: 1.70 --> 3.30 --> 4.90 ng/mL; b.) LHC 05/30/2010: 10% pLCx, 20% pRCA-1, 100% pRCA-2 (chronic) with good collaterals - med mgmt   Peripheral artery disease    a.) 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   Systolic ejection murmur    T2DM (type 2 diabetes mellitus) (HCC)    Venous thromboembolism (VTE) 02/15/2020   a.) 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; CTA (+) for multiple RIGHT segmental/subsegment pulmonary emboli    Current Outpatient Medications  Medication Sig Dispense Refill   ACCU-CHEK GUIDE TEST test strip 3 (three) times daily.     aspirin  EC 81 MG tablet Take 81 mg by mouth in the morning. Swallow whole.     atorvastatin  (LIPITOR ) 80 MG tablet Take 1 tablet (80 mg total) by mouth daily. 30 tablet 5   calcium  acetate (PHOSLO) 667 MG capsule Take 667 mg by mouth.     carboxymethylcellulose (REFRESH PLUS) 0.5 % SOLN Place 1 drop into both eyes in the morning and at bedtime.     CAREFINE PEN NEEDLES 32G X 4 MM MISC CAREFINE PEN NEEDLES 32G X 4 MM     carvedilol  (COREG ) 25 MG tablet Take 1 tablet (25 mg total) by mouth 2 (two) times daily. 180 tablet 3   Continuous Glucose Receiver (DEXCOM G7 RECEIVER) DEVI Use  as instructed with G7 sensors     Continuous Glucose Sensor (DEXCOM G7 SENSOR) MISC Place one sensor on the skin every 10 days for use with Dexcom reader. 9 each 1   DULoxetine  (CYMBALTA ) 20 MG capsule TAKE 2 CAPSULES (40 MG TOTAL) BY MOUTH 2 (TWO) TIMES DAILY. 360 capsule 0   Glucagon 3 MG/DOSE POWD Use 1 spray in 1 nostril for severe hypoglycemia, as per package instructions     HUMALOG  KWIKPEN 100 UNIT/ML KwikPen Inject 8 Units into the skin 3 (three) times daily after meals. (Patient taking differently: Inject 4 Units into the skin 3 (three) times daily after meals. PER SLIDING SCALE) 15 mL 11   hydrALAZINE  (APRESOLINE ) 100 MG tablet Take 1 tablet (100 mg total) by  mouth 3 (three) times daily. 90 tablet 5   isosorbide  dinitrate (ISORDIL ) 30 MG tablet Take 1 tablet (30 mg total) by mouth 3 (three) times daily. 90 tablet 5   ketoconazole  (NIZORAL ) 2 % cream Apply topically 2 (two) times daily. 60 g 0   LANTUS  SOLOSTAR 100 UNIT/ML Solostar Pen Inject 4 Units into the skin at bedtime.     torsemide  (DEMADEX ) 20 MG tablet Take 4 tablets (80 mg total) by mouth daily. Take 4 20 mg tabs = 80 mg 120 tablet 5   traMADol  (ULTRAM ) 50 MG tablet Take 1 tablet (50 mg total) by mouth every 8 (eight) hours as needed. 12 tablet 0   Vitamin D, Ergocalciferol, (DRISDOL) 1.25 MG (50000 UNIT) CAPS capsule Take 50,000 Units by mouth.     No current facility-administered medications for this visit.   Facility-Administered Medications Ordered in Other Visits  Medication Dose Route Frequency Provider Last Rate Last Admin   heparin  lock flush 100 UNIT/ML injection            heparin  lock flush 100 unit/mL  500 Units Intravenous Once Yu, Zhou, MD        Allergies  Allergen Reactions   Lisinopril Swelling and Other (See Comments)    Recurrent AKI and hyperkalemia when initiation attempted. Other Reaction(s): Facial edema      Social History   Socioeconomic History   Marital status: Married    Spouse name:  Not on file   Number of children: Not on file   Years of education: Not on file   Highest education level: Not on file  Occupational History   Not on file  Tobacco Use   Smoking status: Former    Current packs/day: 0.00    Types: Cigars, Cigarettes    Quit date: 01/17/2021    Years since quitting: 3.2   Smokeless tobacco: Never  Vaping Use   Vaping status: Never Used  Substance and Sexual Activity   Alcohol  use: Never   Drug use: Not Currently   Sexual activity: Not Currently  Other Topics Concern   Not on file  Social History Narrative   Not on file   Social Drivers of Health   Financial Resource Strain: Low Risk  (06/29/2023)   Received from [facility]   Overall Financial Resource Strain (CARDIA)    Difficulty of Paying Living Expenses: Not hard at all  Food Insecurity: No Food Insecurity (01/30/2024)   Hunger Vital Sign    Worried About Running Out of Food in the Last Year: Never true    Ran Out of Food in the Last Year: Never true  Transportation Needs: No Transportation Needs (01/30/2024)   PRAPARE - Administrator, Civil Service (Medical): No    Lack of Transportation (Non-Medical): No  Physical Activity: Not on file  Stress: Stress Concern Present (12/15/2021)   [employer] of Occupational Health - Occupational Stress Questionnaire    Feeling of Stress : To some extent  Social Connections: Moderately Integrated (01/30/2024)   Social Connection and Isolation Panel    Frequency of Communication with Friends and Family: More than three times a week    Frequency of Social Gatherings with Friends and Family: Once a week    Attends Religious Services: More than 4 times per year    Active Member of [employer] or Organizations: Yes    Attends Banker Meetings: More than 4 times per year    Marital Status: Widowed  Intimate Partner Violence: Not At Risk (01/30/2024)   Humiliation, Afraid, Rape, and Kick questionnaire    Fear of  Current or Ex-Partner: No    Emotionally Abused: No    Physically Abused: No    Sexually Abused: No      Family History  Problem Relation Age of Onset   Heart disease Mother    Diabetes Mother    Heart disease Father    Diabetes Father    There were no vitals filed for this visit.  Wt Readings from Last 3 Encounters:  03/18/24 114 lb (51.7 kg)  02/29/24 114 lb (51.7 kg)  02/14/24 117 lb (53.1 kg)   Lab Results  Component Value Date   CREATININE 3.71 (H) 03/18/2024   CREATININE 3.34 (H) 02/03/2024   CREATININE 3.59 (H) 02/02/2024    PHYSICAL EXAM:  General: Well appearing female in wheelchair.  Cor: No JVD. Regular rhythm, rate.  Lungs: clear Abdomen: soft, nontender, nondistended. Extremities: 1+ pitting edema left lower leg. Prosthesis on right leg Neuro:. Affect pleasant   ECG: not done   ASSESSMENT & PLAN:  1: ICM with preserved ejection fraction- - multivessel CAD - NYHA class III - euvolemic - weighing daily - Echo 05/30/23: EF 25%, G1DD, severe RV dysfunction - Echo 12/31/23: EF 50-55%, G2DD, mild MR, normal RV  - saw [facility] cardiology 06/25; returns 09/25 - increase carvedilol  to 25mg  BID for BP - continue torsemide  80mg  daily - CKD prohibits MRA/ ARNi/ ARB/ SGLT2 - get compression sock and wear it daily on her left leg - elevate legs when sitting for long periods of time - RN nurse navigator reviewed HF information with patient - BNP 01/29/24 was 3838.7  2: HTN- - BP 188/81 but hasn't taken meds yet today. She is interested in pill packing so will send RX's to [facility] pharmacy for this.  - continue hydralazine / isordil  TID - saw PCP Vinie) 08/25 - BMET 02/07/24 reviewed: sodium 135, potassium 4.7, creatinine 3.63 & GFR 13  3: T2DM- - A1c 02/01/24 was 7.3% - to see endocrinology (09/25)  4: PVD- - right BKA 2021 - saw vascular Luna) 06/25  5: CKD- - fistula placed 07/25 for future dialysis - saw nephrology Geoffry) 08/25  6:  Hyperlipidemia- - LDL 08/31/23 was 122 - continue atorvastatin  80mg  daily   Return in 1 month, sooner if needed.   Ellouise DELENA Class, FNP 04/08/24

## 2024-04-09 ENCOUNTER — Ambulatory Visit: Admitting: Physician Assistant

## 2024-04-09 ENCOUNTER — Encounter: Admitting: Family

## 2024-04-15 ENCOUNTER — Telehealth: Payer: Self-pay | Admitting: Family

## 2024-04-15 NOTE — Progress Notes (Deleted)
 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   Echo 05/30/23: EF 25%, G1DD, severe RV dysfunction  Admitted 06/21/23 due to being volume overloaded. Recent admission prior to that from 12/9 - 06/18/23 (at that time new HF diagnosis, suspected mixed etiology) then did not pick up meds at DC. This January admission pBNP was 89k (131k prior). IV diuresed. BL effusions on CXR. Resumed home GDMT/HTN agents. DC at 115 lbs.   Admitted again a few days later 06/28/23 with hypoglycemia. Incorrect home insulin  administration, which was corrected in hospital. One dose of IV lasix  in hospital. AKI at admission which was improving at DC.   Admitted 07/10/23 with SOB/ cough. Had stopped taking insulin  due to her concern about hypoglycemia although glucose levels have been 400-500's. Cr 4.7 (from 3.8), BUN 76, glucose 324, Hgb 8.6 (around baseline), lipase 110, BHB <0.18, VBG 7.33/43, lactate 1.4, pro-BNP 4321. CXR stable. Given IVF with improvement of renal function. Endocrinology consulted.   Admitted 10/01/23 with HF exacerbation after running out of diuretics 4 days prior. + UDS for cocaine. proBNP was over 32,000 and troponins were 88 when she came in and peaked at 448 but then down-trended quickly. Did not have EKG findings of new ischemia. Cardiology consulted. IV diuresed and weaned to room air.    Echo 12/31/23: EF 50-55%, G2DD, mild MR, normal RV   Was in the ED 01/20/24 with pain and swelling of the left arm after recent AV graft placement. Surgical graft placement on January 10, 2024 by vascular surgery Dr. Marea. Patient  reports since then she has had gradually worsening pain and swelling of the area. Ultrasound negative, vascular consulted. Antibiotics given.   Admitted 01/29/24 with two weeks of progressive sob worse over the last 3-4 days before presentation.Received lasix  drip with transition to oral diuretics. Cardiology and nephrology consults obtained.   ED visit 03/10/24 near syncope. EKG showed NSR and labs WNL. Suspected due to dehydration.   She presents today for her initial HF visit with a chief complaint of shortness of breath. Has associated moderate fatigue. Sleeping well on 1 pillow. Has a Right BKA. Denies chest pain, dizziness, edema, palpitations. Has fistula in left arm for future dialysis. Has not taken her medications yet today and admits that she occasionally forgets them.    Review of Systems: [y] = yes, [ ]  = no   General: Weight gain [ ] ; Weight loss [ ] ; Anorexia [ ] ; Fatigue davis.dad ]; Fever [ ] ; Chills [ ] ; Weakness [ ]   Cardiac: Chest pain/pressure [ ] ; Resting SOB [ ] ; Exertional SOB davis.dad ]; Orthopnea [ ] ; Pedal Edema [ ] ; Palpitations [ ] ; Syncope [ ] ; Presyncope [ ] ; Paroxysmal nocturnal dyspnea[ ]   Pulmonary: Cough [ ] ; Wheezing[ ] ; Hemoptysis[ ] ; Sputum [ ] ; Snoring [ ]   GI: Vomiting[ ] ; Dysphagia[ ] ; Melena[ ] ; Hematochezia [ ] ; Heartburn[ ] ; Abdominal pain [ ] ; Constipation [ ] ; Diarrhea [ ] ; BRBPR [ ]   GU: Hematuria[ ] ; Dysuria [ ] ; Nocturia[ ]   Vascular: Pain in legs with walking [ ] ; Pain in feet with lying flat [ ] ; Non-healing sores [ ] ;  Stroke [ y]; TIA [ ] ; Slurred speech [ ] ;  Neuro: Headaches[ ] ; Vertigo[ ] ; Seizures[ ] ; Paresthesias[ ] ;Blurred vision [ ] ; Diplopia [ ] ; Vision changes [ ]   Ortho/Skin: Arthritis [ ] ; Joint pain [ ] ; Muscle pain [ ] ; Joint swelling [ ] ; Back Pain [ ] ; Rash [ ]   Psych: Depression[ ] ; Anxiety[ ]   Heme: Bleeding problems [ ] ; Clotting disorders [ ] ; Anemia [ y]  Endocrine: Diabetes [ y]; Thyroid  dysfunction[ ]    Past Medical History:   Diagnosis Date   Adenocarcinoma of left lung (HCC)    a.) stage IIIA (pT1c, pN2, cM0) --> s/p LLL lobectomy + 4 cycles of carbo/taxol  + 1 cycle atezolizumab  (discontinued for grade IV toxicity)   Anemia of chronic renal failure    Cerebral microvascular disease    CKD (chronic kidney disease), stage V (HCC)    Cocaine abuse (HCC)    Coronary artery disease    a.) LHC (NSTEMI) 05/30/2010: 10% pLCxm 20% pRCA-1, 100% pRCA-2 - med mgmt; b.) LHC 02/24/2020: CTA pRCA with good L-R collaterals, 50% mLAD, 80% OM1 - med mgmt   Depression    Diabetic foot ulcer (HCC) 10/03/2019   Dyspnea    Elevated LEFT hemidiaphragm    HFrEF (heart failure with reduced ejection fraction) (HCC) 02/16/2020   a.) Dx'd in setting of acute DVT/PE   Hyperlipidemia    Hypertension    Long-term use of aspirin  therapy    NSTEMI (non-ST elevated myocardial infarction) (HCC) 05/28/2010   a.) troponins were trended: 1.70 --> 3.30 --> 4.90 ng/mL; b.) LHC 05/30/2010: 10% pLCx, 20% pRCA-1, 100% pRCA-2 (chronic) with good collaterals - med mgmt   Peripheral artery disease    a.) 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   Systolic ejection murmur    T2DM (type 2 diabetes mellitus) (HCC)    Venous thromboembolism (VTE) 02/15/2020   a.) 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; CTA (+) for multiple RIGHT segmental/subsegment pulmonary emboli    Current Outpatient Medications  Medication Sig Dispense Refill   ACCU-CHEK GUIDE TEST test strip 3 (three) times daily.     aspirin  EC 81 MG tablet Take 81 mg by mouth in the morning. Swallow whole.     atorvastatin  (LIPITOR ) 80 MG tablet Take 1 tablet (80 mg total) by mouth daily. 30 tablet 5   calcium  acetate (PHOSLO) 667 MG capsule Take 667 mg by mouth.     carboxymethylcellulose (REFRESH PLUS) 0.5 % SOLN Place 1 drop into both eyes in the morning and at bedtime.     CAREFINE PEN NEEDLES 32G X 4 MM MISC CAREFINE PEN NEEDLES 32G X 4 MM     carvedilol  (COREG ) 25 MG tablet Take 1 tablet (25 mg total) by  mouth 2 (two) times daily. 180 tablet 3   Continuous Glucose Receiver (DEXCOM G7 RECEIVER) DEVI Use as instructed with G7 sensors     Continuous Glucose Sensor (DEXCOM G7 SENSOR) MISC Place one sensor on the skin every 10 days for use with Dexcom reader. 9 each 1   DULoxetine  (CYMBALTA ) 20 MG capsule TAKE 2 CAPSULES (40 MG TOTAL) BY MOUTH 2 (TWO) TIMES DAILY. 360 capsule 0   Glucagon 3 MG/DOSE POWD Use 1 spray in 1 nostril for severe hypoglycemia, as per package instructions     HUMALOG  KWIKPEN 100 UNIT/ML KwikPen Inject 8 Units into the skin 3 (three) times daily after meals. (Patient taking differently: Inject 4 Units into the skin 3 (three) times daily after meals. PER SLIDING SCALE) 15  mL 11   hydrALAZINE  (APRESOLINE ) 100 MG tablet Take 1 tablet (100 mg total) by mouth 3 (three) times daily. 90 tablet 5   isosorbide  dinitrate (ISORDIL ) 30 MG tablet Take 1 tablet (30 mg total) by mouth 3 (three) times daily. 90 tablet 5   ketoconazole  (NIZORAL ) 2 % cream Apply topically 2 (two) times daily. 60 g 0   LANTUS  SOLOSTAR 100 UNIT/ML Solostar Pen Inject 4 Units into the skin at bedtime.     torsemide  (DEMADEX ) 20 MG tablet Take 4 tablets (80 mg total) by mouth daily. Take 4 20 mg tabs = 80 mg 120 tablet 5   traMADol  (ULTRAM ) 50 MG tablet Take 1 tablet (50 mg total) by mouth every 8 (eight) hours as needed. 12 tablet 0   Vitamin D, Ergocalciferol, (DRISDOL) 1.25 MG (50000 UNIT) CAPS capsule Take 50,000 Units by mouth.     No current facility-administered medications for this visit.   Facility-Administered Medications Ordered in Other Visits  Medication Dose Route Frequency Provider Last Rate Last Admin   heparin  lock flush 100 UNIT/ML injection            heparin  lock flush 100 unit/mL  500 Units Intravenous Once Yu, Zhou, MD        Allergies  Allergen Reactions   Lisinopril Swelling and Other (See Comments)    Recurrent AKI and hyperkalemia when initiation attempted. Other Reaction(s): Facial  edema      Social History   Socioeconomic History   Marital status: Married    Spouse name: Not on file   Number of children: Not on file   Years of education: Not on file   Highest education level: Not on file  Occupational History   Not on file  Tobacco Use   Smoking status: Former    Current packs/day: 0.00    Types: Cigars, Cigarettes    Quit date: 01/17/2021    Years since quitting: 3.2   Smokeless tobacco: Never  Vaping Use   Vaping status: Never Used  Substance and Sexual Activity   Alcohol  use: Never   Drug use: Not Currently   Sexual activity: Not Currently  Other Topics Concern   Not on file  Social History Narrative   Not on file   Social Drivers of Health   Financial Resource Strain: Low Risk  (06/29/2023)   Received from [facility]   Overall Financial Resource Strain (CARDIA)    Difficulty of Paying Living Expenses: Not hard at all  Food Insecurity: No Food Insecurity (01/30/2024)   Hunger Vital Sign    Worried About Running Out of Food in the Last Year: Never true    Ran Out of Food in the Last Year: Never true  Transportation Needs: No Transportation Needs (01/30/2024)   PRAPARE - Administrator, Civil Service (Medical): No    Lack of Transportation (Non-Medical): No  Physical Activity: Not on file  Stress: Stress Concern Present (12/15/2021)   [employer] of Occupational Health - Occupational Stress Questionnaire    Feeling of Stress : To some extent  Social Connections: Moderately Integrated (01/30/2024)   Social Connection and Isolation Panel    Frequency of Communication with Friends and Family: More than three times a week    Frequency of Social Gatherings with Friends and Family: Once a week    Attends Religious Services: More than 4 times per year    Active Member of [employer] or Organizations: Yes    Attends [employer]  or Organization Meetings: More than 4 times per year    Marital Status: Widowed  Intimate Partner  Violence: Not At Risk (01/30/2024)   Humiliation, Afraid, Rape, and Kick questionnaire    Fear of Current or Ex-Partner: No    Emotionally Abused: No    Physically Abused: No    Sexually Abused: No      Family History  Problem Relation Age of Onset   Heart disease Mother    Diabetes Mother    Heart disease Father    Diabetes Father    There were no vitals filed for this visit.  Wt Readings from Last 3 Encounters:  03/18/24 51.7 kg  02/29/24 51.7 kg  02/14/24 53.1 kg   Lab Results  Component Value Date   CREATININE 3.71 (H) 03/18/2024   CREATININE 3.34 (H) 02/03/2024   CREATININE 3.59 (H) 02/02/2024    PHYSICAL EXAM:  General: Well appearing female in wheelchair.  Cor: No JVD. Regular rhythm, rate.  Lungs: clear Abdomen: soft, nontender, nondistended. Extremities: 1+ pitting edema left lower leg. Prosthesis on right leg Neuro:. Affect pleasant   ECG: not done   ASSESSMENT & PLAN:  1: ICM with preserved ejection fraction- - multivessel CAD - NYHA class III - euvolemic - weighing daily - Echo 05/30/23: EF 25%, G1DD, severe RV dysfunction - Echo 12/31/23: EF 50-55%, G2DD, mild MR, normal RV  - saw [facility] cardiology 06/25; returns 11/25 - increase carvedilol  to 25mg  BID for BP - continue torsemide  80mg  daily - CKD prohibits MRA/ ARNi/ ARB/ SGLT2 - get compression sock and wear it daily on her left leg - elevate legs when sitting for long periods of time - RN nurse navigator reviewed HF information with patient - BNP 01/29/24 was 3838.7  2: HTN- - BP 188/81 but hasn't taken meds yet today. She is interested in pill packing so will send RX's to [facility] pharmacy for this.  - continue hydralazine / isordil  TID - saw PCP Vinie) 08/25 - BMET 03/18/24 reviewed: sodium 140, potassium 3.2, creatinine 3.71 & GFR 13  3: T2DM- - A1c 02/01/24 was 7.3% - saw endocrinology (09/25)  4: PVD- - right BKA 2021 - saw vascular Luna) 06/25  5: CKD- - fistula placed  07/25 for future dialysis - saw nephrology Geoffry) 09/25  6: Hyperlipidemia- - LDL 08/31/23 was 122 - continue atorvastatin  80mg  daily   Return in 1 month, sooner if needed.   [name] Blumenthal, RN 04/15/24

## 2024-04-15 NOTE — Telephone Encounter (Signed)
 Called to confirm/remind patient of their appointment at [facility] on 04/16/24.   Appointment:   [] Confirmed  [x] Left mess   [] No answer/No voice mail  [] VM Full/unable to leave message  [] Phone not in service  Patient reminded to bring all medications and/or complete list.  Confirmed patient has transportation. Gave directions, instructed to utilize valet parking.

## 2024-04-16 ENCOUNTER — Telehealth: Payer: Self-pay | Admitting: Family

## 2024-04-16 ENCOUNTER — Ambulatory Visit: Admitting: Family

## 2024-04-16 NOTE — Telephone Encounter (Signed)
 Patient did not show for [facility] appointment on 04/16/24.

## 2024-04-18 ENCOUNTER — Encounter: Payer: Self-pay | Admitting: Oncology

## 2024-04-24 ENCOUNTER — Telehealth: Payer: Self-pay

## 2024-04-24 ENCOUNTER — Other Ambulatory Visit: Payer: Self-pay | Admitting: Physician Assistant

## 2024-04-24 DIAGNOSIS — B353 Tinea pedis: Secondary | ICD-10-CM

## 2024-04-24 DIAGNOSIS — I5032 Chronic diastolic (congestive) heart failure: Secondary | ICD-10-CM

## 2024-04-24 DIAGNOSIS — E1122 Type 2 diabetes mellitus with diabetic chronic kidney disease: Secondary | ICD-10-CM

## 2024-04-24 NOTE — Telephone Encounter (Signed)
 Spoke to pt she does not want to use the new pharmacy she wants to stay with [facility].  KP

## 2024-04-24 NOTE — Telephone Encounter (Signed)
 Called pt to ask about a fax we got to change her pharmacy. Pt stated she does not want to change pharmacies.   Pt stated she is having vaginal bleeding that she has a ppt schedule for a GYN in [place] later this month. Told her to call them to see if they can see her sooner. Pt verbalized understanding.  KP

## 2024-04-24 NOTE — Telephone Encounter (Signed)
 Copied from CRM [id number]. Topic: Clinical - Medication Refill >> Apr 24, 2024  9:34 AM Franky GRADE wrote: Medication: All Active Medications  Has the patient contacted their pharmacy? Yes, the pharmacy is calling to request all active prescriptions be sent so that they can start processing refills for the patient moving forward. (Agent: If no, request that the patient contact the pharmacy for the refill. If patient does not wish to contact the pharmacy document the reason why and proceed with request.) (Agent: If yes, when and what did the pharmacy advise?)  This is the patient's preferred pharmacy:  [facility] - [place], [place] - [address] [place] [address] Phone: [phone] Fax: [phone]   Is this the correct pharmacy for this prescription? Yes If no, delete pharmacy and type the correct one.   Has the prescription been filled recently? No  Is the patient out of the medication? Unsure as they are the patients new pharmacy   Has the patient been seen for an appointment in the last year OR does the patient have an upcoming appointment? Yes  Can we respond through MyChart? Yes  Agent: Please be advised that Rx refills may take up to 3 business days. We ask that you follow-up with your pharmacy.

## 2024-04-25 ENCOUNTER — Ambulatory Visit (INDEPENDENT_AMBULATORY_CARE_PROVIDER_SITE_OTHER): Admitting: Vascular Surgery

## 2024-05-02 ENCOUNTER — Telehealth: Payer: Self-pay | Admitting: Physician Assistant

## 2024-05-02 NOTE — Telephone Encounter (Signed)
 Yes, Pt needs appt. She was supposed to have a follow up in October.  KP

## 2024-05-02 NOTE — Telephone Encounter (Signed)
 Copied from CRM #[id number]. Topic: Referral - Request for Referral >> May 02, 2024  2:13 PM Amy B wrote: Did the patient discuss referral with their provider in the last year? No (If No - schedule appointment) (If Yes - send message)  Appointment offered? No  Type of order/referral and detailed reason for visit: Home Health   Preference of office, provider, location: no preference  If referral order, have you been seen by this specialty before? Yes (If Yes, this issue or another issue? When? Where?  Can we respond through MyChart? No

## 2024-05-02 NOTE — Telephone Encounter (Signed)
 Left voice mail stating patient will need a follow/up appt with referral.

## 2024-05-06 ENCOUNTER — Other Ambulatory Visit: Payer: Self-pay | Admitting: Physician Assistant

## 2024-05-06 DIAGNOSIS — B353 Tinea pedis: Secondary | ICD-10-CM

## 2024-05-06 DIAGNOSIS — I5032 Chronic diastolic (congestive) heart failure: Secondary | ICD-10-CM

## 2024-05-06 NOTE — Telephone Encounter (Signed)
 Copied from CRM #[id number]. Topic: Clinical - Medication Refill >> May 06, 2024  3:25 PM Antony S wrote: Medication:  ketoconazole  (NIZORAL ) 2 % cream  torsemide  (DEMADEX ) 20 MG tablet DULoxetine  (CYMBALTA ) 20 MG capsule carvedilol  (COREG ) 25 MG tablet calcium  acetate (PHOSLO) 667 MG capsule  isosorbide  dinitrate (ISORDIL ) 30 MG tablet HUMALOG  KWIKPEN 100 UNIT/ML KwikPen Glucagon 3 MG/DOSE POWD    Has the patient contacted their pharmacy? Yes (Agent: If no, request that the patient contact the pharmacy for the refill. If patient does not wish to contact the pharmacy document the reason why and proceed with request.) (Agent: If yes, when and what did the pharmacy advise?)  This is the patient's preferred pharmacy:   [facility] - [place], [place] - [address] [place] [address] Phone: [phone] Fax: [phone]   Is this the correct pharmacy for this prescription? Yes If no, delete pharmacy and type the correct one.   Has the prescription been filled recently? unknown  Is the patient out of the medication? unknown  Has the patient been seen for an appointment in the last year OR does the patient have an upcoming appointment? Yes  Can we respond through MyChart? Yes  Agent: Please be advised that Rx refills may take up to 3 business days. We ask that you follow-up with your pharmacy.

## 2024-05-07 ENCOUNTER — Encounter: Payer: Self-pay | Admitting: Oncology

## 2024-05-07 ENCOUNTER — Inpatient Hospital Stay
Admission: EM | Admit: 2024-05-07 | Discharge: 2024-05-13 | DRG: 291 | Disposition: A | Discharge status: Skilled Nursing Facility | Attending: Internal Medicine | Admitting: Internal Medicine

## 2024-05-07 ENCOUNTER — Emergency Department

## 2024-05-07 ENCOUNTER — Other Ambulatory Visit: Payer: Self-pay

## 2024-05-07 DIAGNOSIS — E1151 Type 2 diabetes mellitus with diabetic peripheral angiopathy without gangrene: Secondary | ICD-10-CM | POA: Diagnosis present

## 2024-05-07 DIAGNOSIS — I509 Heart failure, unspecified: Principal | ICD-10-CM

## 2024-05-07 DIAGNOSIS — Z7982 Long term (current) use of aspirin: Secondary | ICD-10-CM | POA: Diagnosis not present

## 2024-05-07 DIAGNOSIS — E785 Hyperlipidemia, unspecified: Secondary | ICD-10-CM | POA: Diagnosis present

## 2024-05-07 DIAGNOSIS — Z89611 Acquired absence of right leg above knee: Secondary | ICD-10-CM | POA: Diagnosis not present

## 2024-05-07 DIAGNOSIS — I132 Hypertensive heart and chronic kidney disease with heart failure and with stage 5 chronic kidney disease, or end stage renal disease: Secondary | ICD-10-CM | POA: Diagnosis present

## 2024-05-07 DIAGNOSIS — Z91148 Patient's other noncompliance with medication regimen for other reason: Secondary | ICD-10-CM | POA: Diagnosis not present

## 2024-05-07 DIAGNOSIS — D539 Nutritional anemia, unspecified: Secondary | ICD-10-CM | POA: Diagnosis present

## 2024-05-07 DIAGNOSIS — Z833 Family history of diabetes mellitus: Secondary | ICD-10-CM

## 2024-05-07 DIAGNOSIS — Z66 Do not resuscitate: Secondary | ICD-10-CM | POA: Diagnosis not present

## 2024-05-07 DIAGNOSIS — Z79899 Other long term (current) drug therapy: Secondary | ICD-10-CM | POA: Diagnosis not present

## 2024-05-07 DIAGNOSIS — Z89511 Acquired absence of right leg below knee: Secondary | ICD-10-CM | POA: Diagnosis not present

## 2024-05-07 DIAGNOSIS — N179 Acute kidney failure, unspecified: Secondary | ICD-10-CM | POA: Diagnosis present

## 2024-05-07 DIAGNOSIS — Z87891 Personal history of nicotine dependence: Secondary | ICD-10-CM

## 2024-05-07 DIAGNOSIS — I5043 Acute on chronic combined systolic (congestive) and diastolic (congestive) heart failure: Secondary | ICD-10-CM | POA: Diagnosis not present

## 2024-05-07 DIAGNOSIS — D649 Anemia, unspecified: Secondary | ICD-10-CM | POA: Diagnosis present

## 2024-05-07 DIAGNOSIS — I25118 Atherosclerotic heart disease of native coronary artery with other forms of angina pectoris: Secondary | ICD-10-CM | POA: Diagnosis present

## 2024-05-07 DIAGNOSIS — I5033 Acute on chronic diastolic (congestive) heart failure: Secondary | ICD-10-CM | POA: Diagnosis present

## 2024-05-07 DIAGNOSIS — E1122 Type 2 diabetes mellitus with diabetic chronic kidney disease: Secondary | ICD-10-CM | POA: Diagnosis present

## 2024-05-07 DIAGNOSIS — I739 Peripheral vascular disease, unspecified: Secondary | ICD-10-CM | POA: Diagnosis not present

## 2024-05-07 DIAGNOSIS — Z8249 Family history of ischemic heart disease and other diseases of the circulatory system: Secondary | ICD-10-CM

## 2024-05-07 DIAGNOSIS — Z7189 Other specified counseling: Secondary | ICD-10-CM | POA: Diagnosis not present

## 2024-05-07 DIAGNOSIS — I2510 Atherosclerotic heart disease of native coronary artery without angina pectoris: Secondary | ICD-10-CM | POA: Diagnosis present

## 2024-05-07 DIAGNOSIS — Z86711 Personal history of pulmonary embolism: Secondary | ICD-10-CM | POA: Diagnosis not present

## 2024-05-07 DIAGNOSIS — F1410 Cocaine abuse, uncomplicated: Secondary | ICD-10-CM | POA: Diagnosis present

## 2024-05-07 DIAGNOSIS — Z902 Acquired absence of lung [part of]: Secondary | ICD-10-CM

## 2024-05-07 DIAGNOSIS — Z794 Long term (current) use of insulin: Secondary | ICD-10-CM | POA: Diagnosis not present

## 2024-05-07 DIAGNOSIS — I2582 Chronic total occlusion of coronary artery: Secondary | ICD-10-CM | POA: Diagnosis present

## 2024-05-07 DIAGNOSIS — N185 Chronic kidney disease, stage 5: Secondary | ICD-10-CM | POA: Diagnosis present

## 2024-05-07 DIAGNOSIS — Z515 Encounter for palliative care: Secondary | ICD-10-CM | POA: Diagnosis not present

## 2024-05-07 DIAGNOSIS — N184 Chronic kidney disease, stage 4 (severe): Secondary | ICD-10-CM | POA: Diagnosis not present

## 2024-05-07 DIAGNOSIS — D631 Anemia in chronic kidney disease: Secondary | ICD-10-CM | POA: Diagnosis present

## 2024-05-07 DIAGNOSIS — I252 Old myocardial infarction: Secondary | ICD-10-CM

## 2024-05-07 DIAGNOSIS — E782 Mixed hyperlipidemia: Secondary | ICD-10-CM | POA: Diagnosis not present

## 2024-05-07 DIAGNOSIS — I428 Other cardiomyopathies: Secondary | ICD-10-CM | POA: Diagnosis present

## 2024-05-07 DIAGNOSIS — Z9221 Personal history of antineoplastic chemotherapy: Secondary | ICD-10-CM

## 2024-05-07 DIAGNOSIS — I5032 Chronic diastolic (congestive) heart failure: Secondary | ICD-10-CM | POA: Diagnosis not present

## 2024-05-07 DIAGNOSIS — Z85118 Personal history of other malignant neoplasm of bronchus and lung: Secondary | ICD-10-CM

## 2024-05-07 LAB — CBC
HCT: 22.3 % — ABNORMAL LOW (ref 36.0–46.0)
Hemoglobin: 6.8 g/dL — ABNORMAL LOW (ref 12.0–15.0)
MCH: 31.3 pg (ref 26.0–34.0)
MCHC: 30.5 g/dL (ref 30.0–36.0)
MCV: 102.8 fL — ABNORMAL HIGH (ref 80.0–100.0)
Platelets: 228 K/uL (ref 150–400)
RBC: 2.17 MIL/uL — ABNORMAL LOW (ref 3.87–5.11)
RDW: 19 % — ABNORMAL HIGH (ref 11.5–15.5)
WBC: 5.2 K/uL (ref 4.0–10.5)
nRBC: 0 % (ref 0.0–0.2)

## 2024-05-07 LAB — URINE DRUG SCREEN
Amphetamines: NEGATIVE
Barbiturates: NEGATIVE
Benzodiazepines: NEGATIVE
Cocaine: POSITIVE — AB
Fentanyl: NEGATIVE
Methadone Scn, Ur: NEGATIVE
Opiates: NEGATIVE
Tetrahydrocannabinol: NEGATIVE

## 2024-05-07 LAB — HEPATIC FUNCTION PANEL
ALT: 17 U/L (ref 0–44)
AST: 24 U/L (ref 15–41)
Albumin: 3 g/dL — ABNORMAL LOW (ref 3.5–5.0)
Alkaline Phosphatase: 118 U/L (ref 38–126)
Bilirubin, Direct: <0.1 mg/dL (ref 0.0–0.2)
Total Bilirubin: <0.2 mg/dL (ref 0.0–1.2)
Total Protein: 5.2 g/dL — ABNORMAL LOW (ref 6.5–8.1)

## 2024-05-07 LAB — BASIC METABOLIC PANEL WITH GFR
Anion gap: 14 (ref 5–15)
BUN: 60 mg/dL — ABNORMAL HIGH (ref 8–23)
CO2: 17 mmol/L — ABNORMAL LOW (ref 22–32)
Calcium: 8.3 mg/dL — ABNORMAL LOW (ref 8.9–10.3)
Chloride: 112 mmol/L — ABNORMAL HIGH (ref 98–111)
Creatinine, Ser: 4.23 mg/dL — ABNORMAL HIGH (ref 0.44–1.00)
GFR, Estimated: 11 mL/min — ABNORMAL LOW (ref >60)
Glucose, Bld: 214 mg/dL — ABNORMAL HIGH (ref 70–99)
Potassium: 3.4 mmol/L — ABNORMAL LOW (ref 3.5–5.1)
Sodium: 142 mmol/L (ref 135–145)

## 2024-05-07 LAB — PREPARE RBC (CROSSMATCH)

## 2024-05-07 LAB — URINALYSIS, ROUTINE W REFLEX MICROSCOPIC
Bacteria, UA: NONE SEEN
Bilirubin Urine: NEGATIVE
Glucose, UA: 150 mg/dL — AB
Hgb urine dipstick: NEGATIVE
Ketones, ur: NEGATIVE mg/dL
Leukocytes,Ua: NEGATIVE
Nitrite: NEGATIVE
Protein, ur: 100 mg/dL — AB
Specific Gravity, Urine: 1.009 (ref 1.005–1.030)
pH: 7 (ref 5.0–8.0)

## 2024-05-07 LAB — PROTIME-INR
INR: 1.1 (ref 0.8–1.2)
Prothrombin Time: 15.3 s — ABNORMAL HIGH (ref 11.4–15.2)

## 2024-05-07 LAB — APTT: aPTT: 29 s (ref 24–36)

## 2024-05-07 LAB — CBG MONITORING, ED: Glucose-Capillary: 282 mg/dL — ABNORMAL HIGH (ref 70–99)

## 2024-05-07 LAB — GLUCOSE, CAPILLARY: Glucose-Capillary: 178 mg/dL — ABNORMAL HIGH (ref 70–99)

## 2024-05-07 LAB — PRO BRAIN NATRIURETIC PEPTIDE: Pro Brain Natriuretic Peptide: >35000 pg/mL — ABNORMAL HIGH (ref <300.0)

## 2024-05-07 MED ORDER — FUROSEMIDE 10 MG/ML IJ SOLN
60.0000 mg | Freq: Once | INTRAMUSCULAR | Status: AC
  Administered 2024-05-07: 60 mg via INTRAVENOUS
  Filled 2024-05-07: qty 8

## 2024-05-07 MED ORDER — ACETAMINOPHEN 325 MG PO TABS
650.0000 mg | ORAL_TABLET | Freq: Four times a day (QID) | ORAL | Status: DC | PRN
Start: 2024-05-07 — End: 2024-05-14
  Administered 2024-05-09 – 2024-05-13 (×5): 650 mg via ORAL
  Filled 2024-05-07 (×5): qty 2

## 2024-05-07 MED ORDER — ACETAMINOPHEN 650 MG RE SUPP: 650.0000 mg | Freq: Four times a day (QID) | RECTAL | Status: DC | PRN

## 2024-05-07 MED ORDER — FUROSEMIDE 10 MG/ML IJ SOLN
80.0000 mg | Freq: Once | INTRAMUSCULAR | Status: AC
  Administered 2024-05-07: 80 mg via INTRAVENOUS
  Filled 2024-05-07: qty 8

## 2024-05-07 MED ORDER — CARVEDILOL 25 MG PO TABS
25.0000 mg | ORAL_TABLET | Freq: Two times a day (BID) | ORAL | Status: DC
  Administered 2024-05-07 – 2024-05-13 (×13): 25 mg via ORAL
  Filled 2024-05-07 (×5): qty 1
  Filled 2024-05-07: qty 4
  Filled 2024-05-07 (×7): qty 1

## 2024-05-07 MED ORDER — INSULIN GLARGINE-YFGN 100 UNIT/ML ~~LOC~~ SOPN: 4.0000 [IU] | PEN_INJECTOR | Freq: Every day | SUBCUTANEOUS | Status: DC

## 2024-05-07 MED ORDER — ENOXAPARIN SODIUM 40 MG/0.4ML IJ SOSY: 40.0000 mg | PREFILLED_SYRINGE | INTRAMUSCULAR | Status: DC

## 2024-05-07 MED ORDER — EZETIMIBE 10 MG PO TABS
10.0000 mg | ORAL_TABLET | Freq: Every day | ORAL | Status: DC
  Administered 2024-05-07 – 2024-05-13 (×7): 10 mg via ORAL
  Filled 2024-05-07 (×7): qty 1

## 2024-05-07 MED ORDER — HYDRALAZINE HCL 50 MG PO TABS
100.0000 mg | ORAL_TABLET | Freq: Three times a day (TID) | ORAL | Status: DC
  Administered 2024-05-07 – 2024-05-13 (×19): 100 mg via ORAL
  Filled 2024-05-07 (×19): qty 2

## 2024-05-07 MED ORDER — SODIUM CHLORIDE 0.9% IV SOLUTION
Freq: Once | INTRAVENOUS | Status: AC
  Filled 2024-05-07: qty 250

## 2024-05-07 MED ORDER — INSULIN GLARGINE-YFGN 100 UNIT/ML ~~LOC~~ SOLN
4.0000 [IU] | Freq: Every day | SUBCUTANEOUS | Status: DC
  Administered 2024-05-07 – 2024-05-13 (×7): 4 [IU] via SUBCUTANEOUS
  Filled 2024-05-07 (×8): qty 0.04

## 2024-05-07 MED ORDER — HEPARIN SODIUM (PORCINE) 5000 UNIT/ML IJ SOLN
5000.0000 [IU] | Freq: Three times a day (TID) | INTRAMUSCULAR | Status: DC
  Administered 2024-05-07 – 2024-05-13 (×19): 5000 [IU] via SUBCUTANEOUS
  Filled 2024-05-07 (×19): qty 1

## 2024-05-07 MED ORDER — INSULIN ASPART 100 UNIT/ML IJ SOLN
4.0000 [IU] | Freq: Three times a day (TID) | INTRAMUSCULAR | Status: DC
  Administered 2024-05-07 – 2024-05-13 (×18): 4 [IU] via SUBCUTANEOUS
  Filled 2024-05-07 (×18): qty 4

## 2024-05-07 MED ORDER — ATORVASTATIN CALCIUM 20 MG PO TABS
80.0000 mg | ORAL_TABLET | Freq: Every day | ORAL | Status: DC
  Administered 2024-05-07 – 2024-05-13 (×7): 80 mg via ORAL
  Filled 2024-05-07: qty 4
  Filled 2024-05-07 (×2): qty 1
  Filled 2024-05-07 (×4): qty 4
  Filled 2024-05-07: qty 1

## 2024-05-07 MED ORDER — ISOSORBIDE DINITRATE 30 MG PO TABS
30.0000 mg | ORAL_TABLET | Freq: Three times a day (TID) | ORAL | Status: DC
  Administered 2024-05-07 – 2024-05-10 (×8): 30 mg via ORAL
  Filled 2024-05-07 (×13): qty 1

## 2024-05-07 MED ORDER — FUROSEMIDE 10 MG/ML IJ SOLN
80.0000 mg | Freq: Two times a day (BID) | INTRAMUSCULAR | Status: DC
  Administered 2024-05-08 – 2024-05-09 (×4): 80 mg via INTRAVENOUS
  Filled 2024-05-07 (×4): qty 8

## 2024-05-07 NOTE — ED Provider Notes (Signed)
 [facility] Provider Note    Event Date/Time   First MD Initiated Contact with Patient 05/07/24 1458     (approximate)   History   Leg Swelling   HPI  [name] [name] is a 68 y.o. female with history of CHF who comes in with concerns for body swelling.  Patient has a right BKA amputation and is currently on diuretics.  Patient reportedly started this on Monday.  Patient reports that she ran out of her diuretics for about a week but she restarted them on Monday.  She taking 40 mg of torsemide  from what she thinks she does this once a day she reports urinating but still having significant swelling in her legs denies any shortness of breath or chest pain.  She states that the swelling is so bad in her legs that she cannot even put her prosthetic on therefore she cannot get around.  Reviewed patient's prior echocardiogram from 01/01/2024 where patient is a EF of 25%  Physical Exam   Triage Vital Signs: ED Triage Vitals  Encounter Vitals Group     BP 05/07/24 1120 (!) 150/59     Girls Systolic BP Percentile --      Girls Diastolic BP Percentile --      Boys Systolic BP Percentile --      Boys Diastolic BP Percentile --      Pulse Rate 05/07/24 1120 66     Resp 05/07/24 1120 16     Temp 05/07/24 1120 98.3 F (36.8 C)     Temp Source 05/07/24 1120 Oral     SpO2 05/07/24 1120 100 %     Weight --      Height --      Head Circumference --      Peak Flow --      Pain Score 05/07/24 1104 0     Pain Loc --      Pain Education --      Exclude from Growth Chart --     Most recent vital signs: Vitals:   05/07/24 1415 05/07/24 1430  BP: (!) 163/67 (!) 174/67  Pulse: 70 72  Resp:    Temp:    SpO2: 100% 100%     General: Awake, no distress.  CV:  Good peripheral perfusion.  Resp:  Normal effort.  Abd:  No distention.  Soft and nontender Other:  2+ pitting edema bilaterally right leg has below-knee amputation.  There is no redness or warmth of the  extremities.   ED Results / Procedures / Treatments   Labs (all labs ordered are listed, but only abnormal results are displayed) Labs Reviewed  CBC - Abnormal; Notable for the following components:      Result Value   RBC 2.17 (*)    Hemoglobin 6.8 (*)    HCT 22.3 (*)    MCV 102.8 (*)    RDW 19.0 (*)    All other components within normal limits  BASIC METABOLIC PANEL WITH GFR - Abnormal; Notable for the following components:   Potassium 3.4 (*)    Chloride 112 (*)    CO2 17 (*)    Glucose, Bld 214 (*)    BUN 60 (*)    Creatinine, Ser 4.23 (*)    Calcium  8.3 (*)    GFR, Estimated 11 (*)    All other components within normal limits     EKG  My interpretation of EKG:  Normal sinus rhythm 72 without any  ST elevation or T wave versions, normal doubles  RADIOLOGY I have reviewed the xray personally and interpreted + effusion in bases   PROCEDURES:  Critical Care performed: No  .1-3 Lead EKG Interpretation  Performed by: Ernest Ronal BRAVO, MD Authorized by: Ernest Ronal BRAVO, MD     Interpretation: normal     ECG rate:  70   ECG rate assessment: normal     Rhythm: sinus rhythm     Ectopy: none     Conduction: normal   .Critical Care  Performed by: Ernest Ronal BRAVO, MD Authorized by: Ernest Ronal BRAVO, MD   Critical care provider statement:    Critical care time (minutes):  30   Critical care was necessary to treat or prevent imminent or life-threatening deterioration of the following conditions: symptomatic anemia.   Critical care was time spent personally by me on the following activities:  Development of treatment plan with patient or surrogate, discussions with consultants, evaluation of patient's response to treatment, examination of patient, ordering and review of laboratory studies, ordering and review of radiographic studies, ordering and performing treatments and interventions, pulse oximetry, re-evaluation of patient's condition and review of old  charts    MEDICATIONS ORDERED IN ED: Medications  enoxaparin  (LOVENOX ) injection 40 mg (has no administration in time range)  acetaminophen  (TYLENOL ) tablet 650 mg (has no administration in time range)    Or  acetaminophen  (TYLENOL ) suppository 650 mg (has no administration in time range)  insulin  glargine-yfgn (SEMGLEE ) Pen 4 Units (has no administration in time range)  insulin  aspart (novoLOG ) injection 4 Units (has no administration in time range)  furosemide  (LASIX ) injection 80 mg (has no administration in time range)  atorvastatin  (LIPITOR ) tablet 80 mg (has no administration in time range)  carvedilol  (COREG ) tablet 25 mg (has no administration in time range)  ezetimibe (ZETIA) tablet 10 mg (has no administration in time range)  hydrALAZINE  (APRESOLINE ) tablet 100 mg (has no administration in time range)  isosorbide  dinitrate (ISORDIL ) tablet 30 mg (has no administration in time range)  0.9 %  sodium chloride  infusion (Manually program via Guardrails IV Fluids) (has no administration in time range)  furosemide  (LASIX ) injection 60 mg (has no administration in time range)  furosemide  (LASIX ) injection 80 mg (80 mg Intravenous Given 05/07/24 1656)     IMPRESSION / MDM / ASSESSMENT AND PLAN / ED COURSE  I reviewed the triage vital signs and the nursing notes.   Patient's presentation is most consistent with severe exacerbation of chronic illness.  Patient comes in with what is consistent with CHF exacerbation.  Given her inability to ambulate secondary to significant swelling and with her CKD I think she would benefit from admission for IV Lasix  to help with diuresis.  Blood work was ordered evaluate for other electrolyte abnormalities, AKI.  BMP shows a creatinine of 4.23 up from baseline around 3.6 CBC shows low hemoglobin of 6.8 baseline between 7.3 and 8.6  Patient's hemoglobin is low but I suspect some of it is related to being fluid overloaded.  I will get a type and  screen and patient has been consented for blood.  I want to try to get an x-ray first just to ensure there is no fluid buildup in her lungs as I do not want to give her blood and have it because shortness of breath and potentially worsening condition.  We will start off with a dose of IV Lasix  first.  She did report having potentially some dark stools  so we will perform a rectal exam.  Rectal exam is with brown stool Hemoccult negative  Did let Dr. Gollan from cardiology know about patient.  Discussed with hospital team for admission.  I went to go order the unit of blood due to patient's anemia and the hospitalist and already put the order in.  She does require transfusions intermittently so although I do suspect a component of this is dilutional I do suspect she could use a unit of blood and there is an order a dose of Lasix  to get afterwards.     The patient is on the cardiac monitor to evaluate for evidence of arrhythmia and/or significant heart rate changes.      FINAL CLINICAL IMPRESSION(S) / ED DIAGNOSES   Final diagnoses:  Acute on chronic congestive heart failure, unspecified heart failure type (HCC)  Symptomatic anemia     Rx / DC Orders   ED Discharge Orders     None        Note:  This document was prepared using Dragon voice recognition software and may include unintentional dictation errors.   Ernest Ronal BRAVO, MD 05/07/24 [phone]

## 2024-05-07 NOTE — H&P (Addendum)
 History and Physical    Patient: [name] [name] DOB: 03-19-1956 DOA: 05/07/2024 DOS: the patient was seen and examined on 05/07/2024 PCP: Manya Toribio SQUIBB, PA  Patient coming from: Home  Chief Complaint:  Chief Complaint  Patient presents with   Leg Swelling   HPI: [name] [name] is a 68 y.o. female with medical history significant of lung cancer, CKD5, DM, HTN, PAD s/p R BKA, chronic systolic Chf who presents to the ED with worseining LE edema. Pt reportedly ran out of diuretic for over one week. Pt resumed home torsemide  on 11/17, however LE remained markedly edematous, unable to fit prosthetic leg. Pt reports over 30lb wt gain in <1 week. Decreased urine output.  In ED, pt was given dose of IV lasix . Pt also found to have hgb 6.8. Hospitalist consutled for consideration for admission. Cardiology was consulted by EDP  Review of Systems: As mentioned in the history of present illness. All other systems reviewed and are negative. Past Medical History:  Diagnosis Date   Adenocarcinoma of left lung (HCC)    a.) stage IIIA (pT1c, pN2, cM0) --> s/p LLL lobectomy + 4 cycles of carbo/taxol  + 1 cycle atezolizumab  (discontinued for grade IV toxicity)   Anemia of chronic renal failure    Cerebral microvascular disease    CKD (chronic kidney disease), stage V (HCC)    Cocaine abuse (HCC)    Coronary artery disease    a.) LHC (NSTEMI) 05/30/2010: 10% pLCxm 20% pRCA-1, 100% pRCA-2 - med mgmt; b.) LHC 02/24/2020: CTA pRCA with good L-R collaterals, 50% mLAD, 80% OM1 - med mgmt   Depression    Diabetic foot ulcer (HCC) 10/03/2019   Dyspnea    Elevated LEFT hemidiaphragm    HFrEF (heart failure with reduced ejection fraction) (HCC) 02/16/2020   a.) Dx'd in setting of acute DVT/PE   Hyperlipidemia    Hypertension    Long-term use of aspirin  therapy    NSTEMI (non-ST elevated myocardial infarction) (HCC) 05/28/2010   a.) troponins were trended: 1.70 --> 3.30 --> 4.90 ng/mL; b.)  LHC 05/30/2010: 10% pLCx, 20% pRCA-1, 100% pRCA-2 (chronic) with good collaterals - med mgmt   Peripheral artery disease    a.) 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   Systolic ejection murmur    T2DM (type 2 diabetes mellitus) (HCC)    Venous thromboembolism (VTE) 02/15/2020   a.) 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; CTA (+) for multiple RIGHT segmental/subsegment pulmonary emboli   Past Surgical History:  Procedure Laterality Date   BELOW KNEE LEG AMPUTATION Right 01/06/2020   INSERTION OF ARTERIOVENOUS (AV) ARTEGRAFT ARM Left 01/10/2024   Procedure: INSERTION, GRAFT, ARTERIOVENOUS, UPPER EXTREMITY;  Surgeon: Marea Selinda RAMAN, MD;  Location: [facility]S;  Service: Vascular;  Laterality: Left;  BRACHIAL AXILLARY   IR IMAGING GUIDED PORT INSERTION  07/27/2021   LEFT HEART CATH AND CORONARY ANGIOGRAPHY Left    Procedure: LEFT HEART CATHETERIZATION AND CORONARY ANGIOGRAPHY; Location: [facility]; Surgeon: Gerhardt Mana and Zachary Prentice Car, MD   LEFT HEART CATH AND CORONARY ANGIOGRAPHY Left 05/30/2010   Procedure: LEFT HEART CATH AND CORONARY ANGIOGRAPHY; Location: [facility]; Surgeon: Vinie Jude, MD   LEFT LOWER PULMONARY LOBECTOMY AND LYMPH NODE DISSECTION Left 02/06/2021   PORTA CATH INSERTION     Social History:  reports that she quit smoking about 3 years ago. Her smoking use included cigars and cigarettes. She has never used smokeless tobacco. She reports that she does not currently use drugs. She reports that she does not  drink alcohol .  Allergies  Allergen Reactions   Lisinopril Swelling and Other (See Comments)    Recurrent AKI and hyperkalemia when initiation attempted. Other Reaction(s): Facial edema    Family History  Problem Relation Age of Onset   Heart disease Mother    Diabetes Mother    Heart disease Father    Diabetes Father     Prior to Admission medications   Medication Sig Start Date End Date Taking? Authorizing Provider  ACCU-CHEK GUIDE TEST test strip 3 (three) times daily. 01/01/24    [provider]  aspirin  EC 81 MG tablet Take 81 mg by mouth in the morning. Swallow whole.    [provider]  atorvastatin  (LIPITOR ) 80 MG tablet Take 1 tablet (80 mg total) by mouth daily. 02/14/24   Donette Ellouise LABOR, FNP  calcium  acetate (PHOSLO) 667 MG capsule Take 667 mg by mouth. 11/23/23 11/22/24  [provider]  carboxymethylcellulose (REFRESH PLUS) 0.5 % SOLN Place 1 drop into both eyes in the morning and at bedtime.    [provider]  CAREFINE PEN NEEDLES 32G X 4 MM MISC CAREFINE PEN NEEDLES 32G X 4 MM 08/19/18   [provider]  carvedilol  (COREG ) 25 MG tablet Take 1 tablet (25 mg total) by mouth 2 (two) times daily. 02/14/24 05/14/24  Donette Ellouise LABOR, FNP  Continuous Glucose Receiver (DEXCOM G7 RECEIVER) DEVI Use as instructed with G7 sensors 08/31/23   [provider]  Continuous Glucose Sensor (DEXCOM G7 SENSOR) MISC Place one sensor on the skin every 10 days for use with Dexcom reader. 02/13/24   Manya Toribio SQUIBB, PA  DULoxetine  (CYMBALTA ) 20 MG capsule TAKE 2 CAPSULES (40 MG TOTAL) BY MOUTH 2 (TWO) TIMES DAILY. 03/12/24   Manya Toribio SQUIBB, PA  Glucagon 3 MG/DOSE POWD Use 1 spray in 1 nostril for severe hypoglycemia, as per package instructions 08/31/23   [provider]  HUMALOG  KWIKPEN 100 UNIT/ML KwikPen Inject 8 Units into the skin 3 (three) times daily after meals. Patient taking differently: Inject 4 Units into the skin 3 (three) times daily after meals. PER SLIDING SCALE 08/25/21   Joshua Cathryne BROCKS, MD  hydrALAZINE  (APRESOLINE ) 100 MG tablet Take 1 tablet (100 mg total) by mouth 3 (three) times daily. 02/14/24   Donette Ellouise LABOR, FNP  isosorbide  dinitrate (ISORDIL ) 30 MG tablet Take 1 tablet (30 mg total) by mouth 3 (three) times daily. 02/14/24   Donette Ellouise LABOR, FNP  ketoconazole  (NIZORAL ) 2 % cream Apply topically 2 (two) times daily. 03/11/24   Manya Toribio SQUIBB, PA  LANTUS  SOLOSTAR 100 UNIT/ML Solostar Pen Inject 4 Units into  the skin at bedtime.    [provider]  torsemide  (DEMADEX ) 20 MG tablet Take 4 tablets (80 mg total) by mouth daily. Take 4 20 mg tabs = 80 mg 02/14/24   Donette Ellouise A, FNP  traMADol  (ULTRAM ) 50 MG tablet Take 1 tablet (50 mg total) by mouth every 8 (eight) hours as needed. 03/18/24   Triplett, Cari B, FNP  Vitamin D, Ergocalciferol, (DRISDOL) 1.25 MG (50000 UNIT) CAPS capsule Take 50,000 Units by mouth. 07/12/23   [provider]    Physical Exam: Vitals:   05/07/24 1120 05/07/24 1415 05/07/24 1430  BP: (!) 150/59 (!) 163/67 (!) 174/67  Pulse: 66 70 72  Resp: 16    Temp: 98.3 F (36.8 C)    TempSrc: Oral    SpO2: 100% 100% 100%   General exam: Awake, laying in  bed, in nad Respiratory system: Normal respiratory effort, no wheezing Cardiovascular system: regular rate, s1, s2 Gastrointestinal system: Soft, nondistended, positive BS Central nervous system: CN2-12 grossly intact, strength intact Extremities: LLE edema, s/p R BKA Skin: Normal skin turgor, no notable skin lesions seen Psychiatry: Mood normal // no visual hallucinations   Data Reviewed:  Labs reviewed: Na 142, K 3.4, Cr 4.23, WBC 5.2, Hgb 6.8, Plts 228   Assessment and Plan: Acutely decompensated systolic and diastolic CHF exac  -most recent EF 25% per care everywhere -Pt has not been with diuretic in over one week -Cont BID IV lasix  as tolerated -Given concurrent CKD, would consult Cardiology to follow -monitor I/o and daily wts  Anemia -Elevated MCV -Will check iron , b12, and folate -recheck CBC in AM -transfuse 1 u with additional dose of lasix   CKD 5 -follow bmet trends and renal function while diuresing -overall stable  DM -cont SSI as needed -cont home regimen  HTN -BP stable -Cont home regimen  PAD -s/p RLE BKA   Advance Care Planning:   Code Status: Prior Full  Consults:   Family Communication: Pt in room  Severity of Illness: The appropriate patient status for  this patient is INPATIENT. Inpatient status is judged to be reasonable and necessary in order to provide the required intensity of service to ensure the patient's safety. The patient's presenting symptoms, physical exam findings, and initial radiographic and laboratory data in the context of their chronic comorbidities is felt to place them at high risk for further clinical deterioration. Furthermore, it is not anticipated that the patient will be medically stable for discharge from the hospital within 2 midnights of admission.   * I certify that at the point of admission it is my clinical judgment that the patient will require inpatient hospital care spanning beyond 2 midnights from the point of admission due to high intensity of service, high risk for further deterioration and high frequency of surveillance required.*  Author: Garnette Pelt, MD 05/07/2024 3:24 PM  For on call review [email or web address].

## 2024-05-07 NOTE — ED Triage Notes (Addendum)
 Pt comes via [facility] from home with c/o body swelling. Pt has pitting edema in lower extremity. Pt has right BK amputation. Pt states she is on diuretic. Pt states this started on Monday. Pt has hx of CHF.  Pt denies any cp or sob.

## 2024-05-07 NOTE — ED Notes (Signed)
 CCMD called to continue monitoring pt in room 30.

## 2024-05-07 NOTE — Progress Notes (Signed)
 Anticoagulation monitoring(Lovenox ):  68 yo  female ordered Lovenox  40 mg Q24h    Filed Weights   05/07/24 2002  Weight: 56.6 kg (124 lb 12.5 oz)   BMI 20.8    Lab Results  Component Value Date   CREATININE 4.23 (H) 05/07/2024   CREATININE 3.71 (H) 03/18/2024   CREATININE 3.34 (H) 02/03/2024   Estimated Creatinine Clearance: 11.7 mL/min (A) (by C-G formula based on SCr of 4.23 mg/dL (H)). Hemoglobin & Hematocrit     Component Value Date/Time   HGB 6.8 (L) 05/07/2024 1149   HGB 9.4 (L) 04/24/2023 0830   HGB 12.9 04/02/2014 0427   HCT 22.3 (L) 05/07/2024 1149   HCT 38.7 04/02/2014 0427     Per Protocol for Patient with estCrcl < 15 ml/min and BMI < 30, will transition to Heparin  5000 units SQ Q8H .

## 2024-05-08 ENCOUNTER — Encounter: Payer: Self-pay | Admitting: Oncology

## 2024-05-08 DIAGNOSIS — I132 Hypertensive heart and chronic kidney disease with heart failure and with stage 5 chronic kidney disease, or end stage renal disease: Secondary | ICD-10-CM | POA: Diagnosis not present

## 2024-05-08 DIAGNOSIS — I25118 Atherosclerotic heart disease of native coronary artery with other forms of angina pectoris: Secondary | ICD-10-CM

## 2024-05-08 DIAGNOSIS — I739 Peripheral vascular disease, unspecified: Secondary | ICD-10-CM

## 2024-05-08 DIAGNOSIS — Z89511 Acquired absence of right leg below knee: Secondary | ICD-10-CM

## 2024-05-08 DIAGNOSIS — I5043 Acute on chronic combined systolic (congestive) and diastolic (congestive) heart failure: Secondary | ICD-10-CM

## 2024-05-08 DIAGNOSIS — D649 Anemia, unspecified: Secondary | ICD-10-CM

## 2024-05-08 DIAGNOSIS — I5032 Chronic diastolic (congestive) heart failure: Secondary | ICD-10-CM | POA: Diagnosis not present

## 2024-05-08 LAB — COMPREHENSIVE METABOLIC PANEL WITH GFR
ALT: 16 U/L (ref 0–44)
AST: 19 U/L (ref 15–41)
Albumin: 2.9 g/dL — ABNORMAL LOW (ref 3.5–5.0)
Alkaline Phosphatase: 112 U/L (ref 38–126)
Anion gap: 11 (ref 5–15)
BUN: 61 mg/dL — ABNORMAL HIGH (ref 8–23)
CO2: 18 mmol/L — ABNORMAL LOW (ref 22–32)
Calcium: 8 mg/dL — ABNORMAL LOW (ref 8.9–10.3)
Chloride: 113 mmol/L — ABNORMAL HIGH (ref 98–111)
Creatinine, Ser: 4 mg/dL — ABNORMAL HIGH (ref 0.44–1.00)
GFR, Estimated: 12 mL/min — ABNORMAL LOW (ref >60)
Glucose, Bld: 220 mg/dL — ABNORMAL HIGH (ref 70–99)
Potassium: 3.5 mmol/L (ref 3.5–5.1)
Sodium: 142 mmol/L (ref 135–145)
Total Bilirubin: 0.2 mg/dL (ref 0.0–1.2)
Total Protein: 5 g/dL — ABNORMAL LOW (ref 6.5–8.1)

## 2024-05-08 LAB — BPAM RBC
Blood Product Expiration Date: 202512192359
ISSUE DATE / TIME: 202511192022
Unit Type and Rh: 5100

## 2024-05-08 LAB — CBC
HCT: 25.8 % — ABNORMAL LOW (ref 36.0–46.0)
Hemoglobin: 8 g/dL — ABNORMAL LOW (ref 12.0–15.0)
MCH: 30.7 pg (ref 26.0–34.0)
MCHC: 31 g/dL (ref 30.0–36.0)
MCV: 98.9 fL (ref 80.0–100.0)
Platelets: 200 K/uL (ref 150–400)
RBC: 2.61 MIL/uL — ABNORMAL LOW (ref 3.87–5.11)
RDW: 18 % — ABNORMAL HIGH (ref 11.5–15.5)
WBC: 5.4 K/uL (ref 4.0–10.5)
nRBC: 0 % (ref 0.0–0.2)

## 2024-05-08 LAB — TYPE AND SCREEN
ABO/RH(D): B POS
Antibody Screen: NEGATIVE
Unit division: 0

## 2024-05-08 LAB — FOLATE: Folate: 8.7 ng/mL (ref >5.9)

## 2024-05-08 LAB — IRON AND TIBC
Iron: 63 ug/dL (ref 28–170)
Saturation Ratios: 29 % (ref 10.4–31.8)
TIBC: 218 ug/dL — ABNORMAL LOW (ref 250–450)
UIBC: 155 ug/dL

## 2024-05-08 LAB — GLUCOSE, CAPILLARY
Glucose-Capillary: 115 mg/dL — ABNORMAL HIGH (ref 70–99)
Glucose-Capillary: 121 mg/dL — ABNORMAL HIGH (ref 70–99)
Glucose-Capillary: 95 mg/dL (ref 70–99)
Glucose-Capillary: 200 mg/dL — ABNORMAL HIGH (ref 70–99)

## 2024-05-08 LAB — TROPONIN T, HIGH SENSITIVITY: Troponin T High Sensitivity: 93 ng/L — ABNORMAL HIGH (ref 0–19)

## 2024-05-08 LAB — VITAMIN B12: Vitamin B-12: 252 pg/mL (ref 180–914)

## 2024-05-08 MED ORDER — INSULIN ASPART 100 UNIT/ML IJ SOLN: 0.0000 [IU] | Freq: Three times a day (TID) | INTRAMUSCULAR | Status: DC

## 2024-05-08 MED ORDER — INSULIN ASPART 100 UNIT/ML IJ SOLN
0.0000 [IU] | Freq: Every day | INTRAMUSCULAR | Status: DC
  Administered 2024-05-12: 5 [IU] via SUBCUTANEOUS
  Filled 2024-05-08: qty 5

## 2024-05-08 MED ORDER — METOLAZONE 2.5 MG PO TABS
2.5000 mg | ORAL_TABLET | Freq: Once | ORAL | Status: AC
  Administered 2024-05-08: 2.5 mg via ORAL
  Filled 2024-05-08: qty 1

## 2024-05-08 MED ORDER — INSULIN ASPART 100 UNIT/ML IJ SOLN
0.0000 [IU] | Freq: Three times a day (TID) | INTRAMUSCULAR | Status: DC
  Administered 2024-05-09: 4 [IU] via SUBCUTANEOUS
  Administered 2024-05-09 – 2024-05-10 (×2): 2 [IU] via SUBCUTANEOUS
  Administered 2024-05-10: 1 [IU] via SUBCUTANEOUS
  Administered 2024-05-10: 2 [IU] via SUBCUTANEOUS
  Administered 2024-05-11: 1 [IU] via SUBCUTANEOUS
  Administered 2024-05-11 – 2024-05-12 (×3): 3 [IU] via SUBCUTANEOUS
  Administered 2024-05-12: 2 [IU] via SUBCUTANEOUS
  Administered 2024-05-13 (×3): 1 [IU] via SUBCUTANEOUS
  Filled 2024-05-08: qty 2
  Filled 2024-05-08 (×2): qty 1
  Filled 2024-05-08: qty 2
  Filled 2024-05-08: qty 3
  Filled 2024-05-08: qty 2
  Filled 2024-05-08: qty 4
  Filled 2024-05-08: qty 1
  Filled 2024-05-08: qty 3
  Filled 2024-05-08: qty 2
  Filled 2024-05-08 (×2): qty 1
  Filled 2024-05-08: qty 3

## 2024-05-08 NOTE — Progress Notes (Signed)
 PROGRESS NOTE    [name] [name]  FMW:[id number] DOB: 03-01-1956 DOA: 05/07/2024 PCP: Manya Toribio SQUIBB, PA   Brief Narrative:   68 y.o. female with medical history significant of lung cancer, CKD5, DM, HTN, PAD s/p R BKA, chronic systolic Chf who presents to the ED with worseining LE edema. Pt reportedly ran out of diuretic for over one week. Pt resumed home torsemide  on 11/17, however LE remained markedly edematous, unable to fit prosthetic leg. Pt reports over 30lb wt gain in <1 week. Decreased urine output.   11/20: Cardio, Nephro, Palliative care, DM nurse c/s   Assessment & Plan:   Principal Problem:   CHF (congestive heart failure) (HCC) Active Problems:   PAD (peripheral artery disease)   Symptomatic anemia   Coronary artery disease of native artery of native heart with stable angina pectoris   Hypertensive heart and chronic kidney disease with heart failure and with stage 5 chronic kidney disease, or end stage renal disease (HCC)   Acutely decompensated diastolic CHF exacerbation - EF improved on echo July 2025 EF 50 to 55%  - Acute worsening likely as she ran out of her Diuretic/Torsemide  for 1 week and has not taken it - Cardio seen - Continue Lasix  80 mg IV bid and given one time dose of Metolazone    Anemia of CKD - s/p 1 PRBC transfusion for Hb 6.8   Acute on CKD 5 -Nephro c/s - baseline creat around 3.5 and on admission 4.23   DM -cont SSI as needed -cont home regimen   HTN -BP stable -Cont home regimen   PAD -s/p RLE BKA  GOC Palliative care c/s  UDS + for cocaine   DVT prophylaxis: Heparin  heparin  injection 5,000 Units Start: 05/07/24 2230     Code Status: Full code Family Communication: None at bedside Disposition Plan: possible D/C in 2-3 days depending on clinical condition, Nephro & cardio w/up   Consultants:  Nephro Cardio    Subjective:  Feels somewhat better, less SOB  Objective: Vitals:   05/08/24 0756 05/08/24 1148  05/08/24 1611 05/08/24 2002  BP: (!) 144/77 (!) 120/57 131/60 (!) 155/63  Pulse: 66 65 67 69  Resp:    17  Temp: 98.5 F (36.9 C) 98.2 F (36.8 C) 98.2 F (36.8 C) 98.1 F (36.7 C)  TempSrc:      SpO2: 100% 100% 100% 100%  Weight:      Height:        Intake/Output Summary (Last 24 hours) at 05/08/2024 2007 Last data filed at 05/08/2024 1300 Gross per 24 hour  Intake 689.03 ml  Output 900 ml  Net -210.97 ml   Filed Weights   05/07/24 2002 05/08/24 0432  Weight: 56.6 kg 56.6 kg    Examination:  General exam: Appears calm and comfortable  Respiratory system: Clear to auscultation. Respiratory effort normal. Cardiovascular system: S1 & S2 heard, RRR. 1 + pedal edema. Gastrointestinal system: Abdomen is soft, benign Central nervous system: Alert and oriented. No focal neurological deficits. Extremities: Rt BKA HD Access: Left AVF  Psychiatry: Judgement and insight appear normal. Mood & affect appropriate.     Data Reviewed: I have personally reviewed following labs and imaging studies  CBC: Recent Labs  Lab 05/07/24 1149 05/08/24 0046  WBC 5.2 5.4  HGB 6.8* 8.0*  HCT 22.3* 25.8*  MCV 102.8* 98.9  PLT 228 200   Basic Metabolic Panel: Recent Labs  Lab 05/07/24 1149 05/08/24 0046  NA 142 142  K 3.4*  3.5  CL 112* 113*  CO2 17* 18*  GLUCOSE 214* 220*  BUN 60* 61*  CREATININE 4.23* 4.00*  CALCIUM  8.3* 8.0*   GFR: Estimated Creatinine Clearance: 12.4 mL/min (A) (by C-G formula based on SCr of 4 mg/dL (H)). Liver Function Tests: Recent Labs  Lab 05/07/24 1536 05/08/24 0046  AST 24 19  ALT 17 16  ALKPHOS 118 112  BILITOT <0.2 0.2  PROT 5.2* 5.0*  ALBUMIN 3.0* 2.9*   No results for input(s): LIPASE, AMYLASE in the last 168 hours. No results for input(s): AMMONIA in the last 168 hours. Coagulation Profile: Recent Labs  Lab 05/07/24 1536  INR 1.1   Cardiac Enzymes: No results for input(s): CKTOTAL, CKMB, CKMBINDEX, TROPONINI in  the last 168 hours. BNP (last 3 results) Recent Labs    05/07/24 1536  PROBNP >35,000.0*   HbA1C: No results for input(s): HGBA1C in the last 72 hours. CBG: Recent Labs  Lab 05/07/24 1932 05/07/24 2254 05/08/24 0835 05/08/24 1145 05/08/24 1608  GLUCAP 282* 178* 115* 121* 95    Anemia Panel: Recent Labs    05/08/24 0046  VITAMINB12 252  FOLATE 8.7  TIBC 218*  IRON  63    Radiology Studies: DG Chest Port 1 View Result Date: 05/07/2024 EXAM: 1 VIEW(S) XRAY OF THE CHEST 05/07/2024 03:32:00 PM COMPARISON: 01/29/2024 CLINICAL HISTORY: SOB (shortness of breath) FINDINGS: LINES, TUBES AND DEVICES: Stable right internal jugular Port-A-Cath. LUNGS AND PLEURA: Elevated left hemidiaphragm. Mild left basilar atelectasis or scarring is noted. Minimal left pleural effusion. No pneumothorax. HEART AND MEDIASTINUM: No acute abnormality of the cardiac and mediastinal silhouettes. BONES AND SOFT TISSUES: No acute osseous abnormality. IMPRESSION: 1. Mild left basilar atelectasis or scarring with minimal left pleural effusion. 2. Elevated left hemidiaphragm. Electronically signed by: Lynwood Seip MD 05/07/2024 03:46 PM EST RP Workstation: [id number]        Scheduled Meds:  atorvastatin   80 mg Oral Daily   carvedilol   25 mg Oral BID WC   ezetimibe  10 mg Oral Daily   furosemide   80 mg Intravenous BID   heparin  injection (subcutaneous)  5,000 Units Subcutaneous Q8H   hydrALAZINE   100 mg Oral Q8H   insulin  aspart  0-6 Units Subcutaneous TID WC   insulin  aspart  4 Units Subcutaneous TID WC   insulin  glargine-yfgn  4 Units Subcutaneous QHS   isosorbide  dinitrate  30 mg Oral TID   Continuous Infusions:   LOS: 1 day    Time spent: 35 mins    Aurilla Coulibaly Maree, MD [facility] Pager [phone]  If 7PM-7AM, please contact night-coverage [email or web address]  05/08/2024, 8:07 PM

## 2024-05-08 NOTE — Progress Notes (Signed)
 [facility]  ROUNDING NOTE   Subjective:   [name] [name]  is a 68 y.o.  female  with medical history to include CAD, diabetes, cocaine abuse, depression. HTN, lung ca with LLL lobectomy and chronic kidney disease stage 4. Patient presents to the ED with lower extremity edema and has been admitted for CHF (congestive heart failure) (HCC) [I50.9] Symptomatic anemia [D64.9] Acute on chronic congestive heart failure, unspecified heart failure type [facility]) [I50.9]  Patient is known to our practice and follows with Dr Marcelino, was seen in office on 03/10/24. States she has been taking her medications as prescribed. Unsure when edema progressed but never experienced shortness of breath. She is seen resting in bed, room air. Denies pain or discomfort.   Labs on ED arrival concerning for potassium 3.4, serum bicarb 17, BUN 60, creatinine 4.23 with GFR 11, and hemoglobin 6.8.  UA appears clear with mild proteinuria.  UDS positive for cocaine.  Chest x-ray shows mild left basilar atelectasis.  We have been consulted to evaluate acute kidney injury.   Objective:  Vital signs in last 24 hours:  Temp:  [98 F (36.7 C)-98.7 F (37.1 C)] 98.2 F (36.8 C) (11/20 1148) Pulse Rate:  [61-73] 65 (11/20 1148) Resp:  [18-24] 18 (11/20 0432) BP: (120-177)/(53-78) 120/57 (11/20 1148) SpO2:  [100 %] 100 % (11/20 1148) Weight:  [56.6 kg] 56.6 kg (11/20 0432)  Weight change:  Filed Weights   05/07/24 2002 05/08/24 0432  Weight: 56.6 kg 56.6 kg    Intake/Output: I/O last 3 completed shifts: In: 449 [P.O.:120; Blood:329] Out: 400 [Urine:400]   Intake/Output this shift:  Total I/O In: 240 [P.O.:240] Out: 500 [Urine:500]  Physical Exam: General: NAD  Head: Normocephalic, atraumatic. Moist oral mucosal membranes  Eyes: Anicteric  Lungs:  Clear to auscultation, normal effort  Heart: Regular rate and rhythm  Abdomen:  Soft, nontender  Extremities: 1+ peripheral edema.  Right AKA   Neurologic: Awake, alert, conversant  Skin: Warm,dry, no rash  Access: Left AVF    Basic Metabolic Panel: Recent Labs  Lab 05/07/24 1149 05/08/24 0046  NA 142 142  K 3.4* 3.5  CL 112* 113*  CO2 17* 18*  GLUCOSE 214* 220*  BUN 60* 61*  CREATININE 4.23* 4.00*  CALCIUM  8.3* 8.0*    Liver Function Tests: Recent Labs  Lab 05/07/24 1536 05/08/24 0046  AST 24 19  ALT 17 16  ALKPHOS 118 112  BILITOT <0.2 0.2  PROT 5.2* 5.0*  ALBUMIN 3.0* 2.9*   No results for input(s): LIPASE, AMYLASE in the last 168 hours. No results for input(s): AMMONIA in the last 168 hours.  CBC: Recent Labs  Lab 05/07/24 1149 05/08/24 0046  WBC 5.2 5.4  HGB 6.8* 8.0*  HCT 22.3* 25.8*  MCV 102.8* 98.9  PLT 228 200    Cardiac Enzymes: No results for input(s): CKTOTAL, CKMB, CKMBINDEX, TROPONINI in the last 168 hours.  BNP: Invalid input(s): POCBNP  CBG: Recent Labs  Lab 05/07/24 1932 05/07/24 2254 05/08/24 0835 05/08/24 1145  GLUCAP 282* 178* 115* 121*    Microbiology: Results for orders placed or performed during the hospital encounter of 01/29/24  C Difficile Quick Screen w PCR reflex     Status: None   Collection Time: 01/31/24  1:38 PM   Specimen: STOOL  Result Value Ref Range Status   C Diff antigen NEGATIVE NEGATIVE Final   C Diff toxin NEGATIVE NEGATIVE Final   C Diff interpretation No C. difficile detected.  Final    Comment: Performed at [facility], [address] [place], [place] [address]    Coagulation Studies: Recent Labs    05/07/24 1536  LABPROT 15.3*  INR 1.1    Urinalysis: Recent Labs    05/07/24 2035  COLORURINE STRAW*  LABSPEC 1.009  PHURINE 7.0  GLUCOSEU 150*  HGBUR NEGATIVE  BILIRUBINUR NEGATIVE  KETONESUR NEGATIVE  PROTEINUR 100*  NITRITE NEGATIVE  LEUKOCYTESUR NEGATIVE      Imaging: DG Chest Port 1 View Result Date: 05/07/2024 EXAM: 1 VIEW(S) XRAY OF THE CHEST 05/07/2024 03:32:00 PM COMPARISON:  01/29/2024 CLINICAL HISTORY: SOB (shortness of breath) FINDINGS: LINES, TUBES AND DEVICES: Stable right internal jugular Port-A-Cath. LUNGS AND PLEURA: Elevated left hemidiaphragm. Mild left basilar atelectasis or scarring is noted. Minimal left pleural effusion. No pneumothorax. HEART AND MEDIASTINUM: No acute abnormality of the cardiac and mediastinal silhouettes. BONES AND SOFT TISSUES: No acute osseous abnormality. IMPRESSION: 1. Mild left basilar atelectasis or scarring with minimal left pleural effusion. 2. Elevated left hemidiaphragm. Electronically signed by: Lynwood Seip MD 05/07/2024 03:46 PM EST RP Workstation: [id number]     Medications:     atorvastatin   80 mg Oral Daily   carvedilol   25 mg Oral BID WC   ezetimibe  10 mg Oral Daily   furosemide   80 mg Intravenous BID   heparin  injection (subcutaneous)  5,000 Units Subcutaneous Q8H   hydrALAZINE   100 mg Oral Q8H   insulin  aspart  0-6 Units Subcutaneous TID WC   insulin  aspart  4 Units Subcutaneous TID WC   insulin  glargine-yfgn  4 Units Subcutaneous QHS   isosorbide  dinitrate  30 mg Oral TID   acetaminophen  **OR** acetaminophen   Assessment/ Plan:  [name] is a 68 y.o.  female  with medical history to include CAD, diabetes, cocaine abuse, depression. HTN, lung ca with LLL lobectomy and chronic kidney disease stage 4.  Acute Kidney Injury on chronic kidney disease stage 5 with baseline creatinine 3.48 and GFR of 14 on 03/10/2024.  Acute kidney injury secondary to volume overload, likely cardiorenal component.  Prescribed outpatient diuretics, reports compliance.  Has AVF in place, bruit and thrill present.  No acute indication for dialysis.  Will continue to monitor renal function during diuretic therapy.   Lab Results  Component Value Date   CREATININE 4.00 (H) 05/08/2024   CREATININE 4.23 (H) 05/07/2024   CREATININE 3.71 (H) 03/18/2024    Intake/Output Summary (Last 24 hours) at 05/08/2024 1511 Last data filed  at 05/08/2024 1300 Gross per 24 hour  Intake 689.03 ml  Output 900 ml  Net -210.97 ml   2.  Acute on chronic diastolic heart failure.  Last echo on 12/2023 shows EF 50 to 55% with a grade 2 diastolic dysfunction with mitral regurgitation.  Chest x-ray shows mild left basilar atelectasis versus minimal left small pleural effusion.  Prescribed torsemide  80 mg daily outpatient.  Currently receiving IV furosemide  80 mg twice a daily.  Will order 1 dose of metolazone 2.5 mg today.  3. Anemia of chronic kidney disease Lab Results  Component Value Date   HGB 8.0 (L) 05/08/2024    Hemoglobin decreased, 8.0.  Will continue to monitor for need of ESA.  Patient did receive 1 unit blood transfusion yesterday for hemoglobin 6.8.  4.  Hypertension with chronic kidney disease.  Currently receiving hydralazine , isosorbide , and carvedilol .  Also prescribed IV furosemide  as above.   LOS: 1 Giles Currie 11/20/20253:11 PM

## 2024-05-08 NOTE — Inpatient Diabetes Management (Signed)
 Inpatient Diabetes Program Recommendations  AACE/ADA: New Consensus Statement on Inpatient Glycemic Control (2015)  Target Ranges:  Prepandial:   less than 140 mg/dL      Peak postprandial:   less than 180 mg/dL (1-2 hours)      Critically ill patients:  140 - 180 mg/dL   Lab Results  Component Value Date   GLUCAP 115 (H) 05/08/2024   HGBA1C 7.3 (H) 02/01/2024    Review of Glycemic Control  Latest Reference Range & Units 05/07/24 19:32 05/07/24 22:54 05/08/24 08:35  Glucose-Capillary 70 - 99 mg/dL 717 (H) 821 (H) 884 (H)  (H): Data is abnormally high  Diabetes history: DM2 Outpatient Diabetes medications:  Lantus  4 units every day Humalog  4 units TID Dexcom G7 Current orders for Inpatient glycemic control:  Semglee  4 units every day Novolog  4 units TID  Inpatient Diabetes Program Recommendations:    Please consider:  Novolog  0-6 units TID  Thank you, [name] Pinal, MSN, CDCES Diabetes Coordinator Inpatient Diabetes Program [phone] (team pager from 8a-5p)

## 2024-05-08 NOTE — Consult Note (Addendum)
 Consultation Note Date: 05/08/2024   Patient Name: [name] [name]  DOB: 12/03/1955  MRN: [id number]  Age / Sex: 68 y.o., female  PCP: [name] Toribio SQUIBB, PA Referring Physician: Maree Hue, MD  Reason for Consultation: Establishing goals of care  HPI/Patient Profile: [name] [name] is a 68 y.o. female with medical history significant of lung cancer, CKD5, DM, HTN, PAD s/p R BKA, chronic systolic Chf who presents to the ED with worseining LE edema. Pt reportedly ran out of diuretic for over one week. Pt resumed home torsemide  on 11/17, however LE remained markedly edematous, unable to fit prosthetic leg. Pt reports over 30lb wt gain in <1 week. Decreased urine output.   Clinical Assessment and Goals of Care: Notes and labs reviewed.  In to see patient.  She is currently resting in bed at this time.  She states she was widowed in May, and has 4 children.  She states she lives with her cousin as none of her children are able to allow her to live with them.  She states there is a lot of traffic coming in and out of the house she is living in, at all times of the day and night, and there is illicit drug use in the house.  We discussed her diagnoses, prognosis, GOC, EOL wishes disposition and options.  Created space and opportunity for patient  to explore thoughts and feelings regarding current medical information.   A detailed discussion was had today regarding advanced directives.  Concepts specific to code status, artifical feeding and hydration, IV antibiotics and rehospitalization were discussed.  The difference between an aggressive medical intervention path and a comfort care path was discussed.  Values and goals of care important to patient and family were attempted to be elicited.  Discussed limitations of medical interventions to prolong quality of life in some situations and discussed the concept of human  mortality.  She understands her cardiac and renal function.  She understands her other health issues.  She states she used to use cocaine but stopped.  She states she did use cocaine a couple weeks ago, but did not use again.  She discusses that she wants to do everything possible to live as long as she can, as long as she can live.  She states as long as she has her mental faculties and is able to physically care for herself, this would be considered acceptable quality of life.  She states she would be amenable to dialysis if needed.  She states she would never want to live long-term on life support.  She would want to try CPR, but again would not want to lift on life support.  She states things are difficult because she has no energy, does not feel like moving, and suffers with shortness of breath.    She states she would like to be placed in a facility, including a group home.  She states she would like to get out of her living situation.  With thought, patient states she believes she  would want her daughter [name] [name] who lives in [place]  to be her surrogate management consultant.     SUMMARY OF RECOMMENDATIONS   Full code and full scope      Primary Diagnoses: Present on Admission:  PAD (peripheral artery disease)  Symptomatic anemia  Coronary artery disease of native artery of native heart with stable angina pectoris   I have reviewed the medical record, interviewed the patient and family, and examined the patient. The following aspects are pertinent.  Past Medical History:  Diagnosis Date   Adenocarcinoma of left lung (HCC)    a.) stage IIIA (pT1c, pN2, cM0) --> s/p LLL lobectomy + 4 cycles of carbo/taxol  + 1 cycle atezolizumab  (discontinued for grade IV toxicity)   Anemia of chronic renal failure    Cerebral microvascular disease    CKD (chronic kidney disease), stage V (HCC)    Cocaine abuse (HCC)    Coronary artery disease    a.) LHC (NSTEMI) 05/30/2010: 10% pLCxm 20% pRCA-1, 100%  pRCA-2 - med mgmt; b.) LHC 02/24/2020: CTA pRCA with good L-R collaterals, 50% mLAD, 80% OM1 - med mgmt   Depression    Diabetic foot ulcer (HCC) 10/03/2019   Dyspnea    Elevated LEFT hemidiaphragm    HFrEF (heart failure with reduced ejection fraction) (HCC) 02/16/2020   a.) Dx'd in setting of acute DVT/PE   Hyperlipidemia    Hypertension    Long-term use of aspirin  therapy    NSTEMI (non-ST elevated myocardial infarction) (HCC) 05/28/2010   a.) troponins were trended: 1.70 --> 3.30 --> 4.90 ng/mL; b.) LHC 05/30/2010: 10% pLCx, 20% pRCA-1, 100% pRCA-2 (chronic) with good collaterals - med mgmt   Peripheral artery disease    a.) 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   Systolic ejection murmur    T2DM (type 2 diabetes mellitus) (HCC)    Venous thromboembolism (VTE) 02/15/2020   a.) 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; CTA (+) for multiple RIGHT segmental/subsegment pulmonary emboli   Social History   Socioeconomic History   Marital status: Married    Spouse name: Not on file   Number of children: Not on file   Years of education: Not on file   Highest education level: Not on file  Occupational History   Not on file  Tobacco Use   Smoking status: Former    Current packs/day: 0.00    Types: Cigars, Cigarettes    Quit date: 01/17/2021    Years since quitting: 3.3   Smokeless tobacco: Never  Vaping Use   Vaping status: Never Used  Substance and Sexual Activity   Alcohol  use: Never   Drug use: Not Currently   Sexual activity: Not Currently  Other Topics Concern   Not on file  Social History Narrative   Not on file   Social Drivers of Health   Financial Resource Strain: Low Risk  (06/29/2023)   Received [facility]   Overall Financial Resource Strain (CARDIA)    Difficulty of Paying Living Expenses: Not hard at all  Food Insecurity: No Food Insecurity (05/08/2024)   Hunger Vital Sign    Worried About Running Out of Food in the Last Year: Never true    Ran Out of  Food in the Last Year: Never true  Transportation Needs: No Transportation Needs (05/08/2024)   PRAPARE - Administrator, Civil Service (Medical): No    Lack of Transportation (Non-Medical): No  Physical Activity: Not on file  Stress: Stress Concern Present (12/15/2021)  [employer] of Occupational Health - Occupational Stress Questionnaire    Feeling of Stress : To some extent  Social Connections: Moderately Integrated (05/08/2024)   Social Connection and Isolation Panel    Frequency of Communication with Friends and Family: More than three times a week    Frequency of Social Gatherings with Friends and Family: More than three times a week    Attends Religious Services: More than 4 times per year    Active Member of [employer] or Organizations: Yes    Attends Banker Meetings: More than 4 times per year    Marital Status: Widowed   Family History  Problem Relation Age of Onset   Heart disease Mother    Diabetes Mother    Heart disease Father    Diabetes Father    Scheduled Meds:  atorvastatin   80 mg Oral Daily   carvedilol   25 mg Oral BID WC   ezetimibe  10 mg Oral Daily   furosemide   80 mg Intravenous BID   heparin  injection (subcutaneous)  5,000 Units Subcutaneous Q8H   hydrALAZINE   100 mg Oral Q8H   insulin  aspart  0-6 Units Subcutaneous TID WC   insulin  aspart  4 Units Subcutaneous TID WC   insulin  glargine-yfgn  4 Units Subcutaneous QHS   isosorbide  dinitrate  30 mg Oral TID   Continuous Infusions: PRN Meds:.acetaminophen  **OR** acetaminophen  Medications Prior to Admission:  Prior to Admission medications   Medication Sig Start Date End Date Taking? Authorizing Provider  aspirin  EC 81 MG tablet Take 81 mg by mouth in the morning. Swallow whole.   Yes [provider]  atorvastatin  (LIPITOR ) 80 MG tablet Take 1 tablet (80 mg total) by mouth daily. 02/14/24  Yes [place] A, FNP  calcitRIOL (ROCALTROL) 0.25 MCG capsule Take 0.25 mcg  by mouth daily. 03/10/24 03/10/25 Yes [provider]  calcium  acetate (PHOSLO) 667 MG capsule Take 667 mg by mouth. 11/23/23 11/22/24 Yes [provider]  carboxymethylcellulose (REFRESH PLUS) 0.5 % SOLN Place 1 drop into both eyes in the morning and at bedtime.   Yes [provider]  carvedilol  (COREG ) 25 MG tablet Take 1 tablet (25 mg total) by mouth 2 (two) times daily. 02/14/24 05/14/24 Yes Hackney, Tina A, FNP  DULoxetine  (CYMBALTA ) 20 MG capsule TAKE 2 CAPSULES (40 MG TOTAL) BY MOUTH 2 (TWO) TIMES DAILY. 03/12/24  Yes [name] Toribio SQUIBB, PA  Glucagon 3 MG/DOSE POWD Use 1 spray in 1 nostril for severe hypoglycemia, as per package instructions 08/31/23  Yes [provider]  hydrALAZINE  (APRESOLINE ) 100 MG tablet Take 1 tablet (100 mg total) by mouth 3 (three) times daily. 02/14/24  Yes [place] A, FNP  insulin  lispro (HUMALOG ) 100 UNIT/ML injection Inject 4 Units into the skin 3 (three) times daily with meals. plus sliding scale before meals Insulin  dose 70-150 0 units 151-200 1 unit, 201-250 2 units, 251-300 3 units, 301-350 4 units, 351+5 units 05/02/24  Yes [provider]  isosorbide  dinitrate (ISORDIL ) 30 MG tablet Take 1 tablet (30 mg total) by mouth 3 (three) times daily. 02/14/24  Yes Hackney, Tina A, FNP  ketoconazole  (NIZORAL ) 2 % cream Apply topically 2 (two) times daily. 03/11/24  Yes [name] Toribio SQUIBB, PA  LANTUS  SOLOSTAR 100 UNIT/ML Solostar Pen Inject 4 Units into the skin at bedtime.   Yes [provider]  torsemide  (DEMADEX ) 20 MG tablet Take 4 tablets (80 mg total) by mouth daily. Take 4 20 mg tabs =  80 mg 02/14/24  Yes Hackney, Ellouise A, FNP  ACCU-CHEK GUIDE TEST test strip 3 (three) times daily. 01/01/24   [provider]  CAREFINE PEN NEEDLES 32G X 4 MM MISC CAREFINE PEN NEEDLES 32G X 4 MM 08/19/18   [provider]  Continuous Glucose Receiver (DEXCOM G7 RECEIVER) DEVI Use as instructed with G7 sensors 08/31/23    [provider]  Continuous Glucose Sensor (DEXCOM G7 SENSOR) MISC Place one sensor on the skin every 10 days for use with Dexcom reader. 02/13/24   [name] Toribio SQUIBB, PA  traMADol  (ULTRAM ) 50 MG tablet Take 1 tablet (50 mg total) by mouth every 8 (eight) hours as needed. Patient not taking: Reported on 05/07/2024 03/18/24   Triplett, Cari B, FNP  Vitamin D, Ergocalciferol, (DRISDOL) 1.25 MG (50000 UNIT) CAPS capsule Take 50,000 Units by mouth. Patient not taking: Reported on 05/07/2024 07/12/23   [provider]   Allergies  Allergen Reactions   Lisinopril Other (See Comments) and Swelling    Recurrent AKI and hyperkalemia when initiation attempted.  Other Reaction(s): Facial edema  Other Reaction(s): Facial edema, hyperkalemia, Other (See Comments)    Recurrent AKI and hyperkalemia when initiation attempted. Other Reaction(s): Facial edema    Recurrent AKI and hyperkalemia when initiation attempted.  Other Reaction(s): Facial edema, hyperkalemia  Recurrent AKI and hyperkalemia when initiation attempted.  Other Reaction(s): Facial edema, hyperkalemia  Recurrent AKI and hyperkalemia when initiation attempted.  Other Reaction(s): Facial edema, hyperkalemia   Cyanoacrylate Dermatitis   Review of Systems  Constitutional:  Positive for fatigue.    Physical Exam Pulmonary:     Effort: Pulmonary effort is normal.  Neurological:     Mental Status: She is alert.     Vital Signs: BP (!) 120/57 (BP Location: Right Arm)   Pulse 65   Temp 98.2 F (36.8 C)   Resp 18   Ht 5' 5 (1.651 m)   Wt 56.6 kg   SpO2 100%   BMI 20.76 kg/m  Pain Scale: 0-10   Pain Score: 0-No pain   SpO2: SpO2: 100 % O2 Device:SpO2: 100 % O2 Flow Rate: .   IO: Intake/output summary:  Intake/Output Summary (Last 24 hours) at 05/08/2024 1557 Last data filed at 05/08/2024 1300 Gross per 24 hour  Intake 689.03 ml  Output 900 ml  Net -210.97 ml    LBM: Last BM Date : 05/07/24 Baseline  Weight: Weight: 56.6 kg Most recent weight: Weight: 56.6 kg      Signed by: Camelia Lewis, NP   Please contact Palliative Medicine Team phone at [phone] for questions and concerns.  For individual provider: See Tracey

## 2024-05-08 NOTE — Consult Note (Signed)
 Cardiology Consultation   Patient ID: [name] [name] MRN: [id number]; DOB: Mar 03, 1956  Admit date: 05/07/2024 Date of Consult: 05/08/2024  PCP:  Manya Toribio SQUIBB, PA   [facility] Providers Cardiologist:  [facility]  Patient Profile: [name] [name] is a 68 y.o. 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 Maree.  History of Present Illness: [name] [name] is followed by [facility] cardiology and more recently with [facility], Advanced heart failure team.   She was diagnosed with HFrEF in 2021 at [facility] with TTE showed LVEF 20-25%. LHC in 02/2020 showed CTO of the RCA with L>R collaterals, 50% M LAD and 80% OM1 stenosis. No interventions performed. Echo 12/2020 normal BIV size and function with no significant valvular abnormalities.   Admitted 12/9 to 06/18/23 to [facility] for decompensated heart failure. Echo showed LVEF 25% severe systolic dysfunction, mod TR. She was started on diuretic therapy for the first time (torsemide  80mg  BID. During the hospitalization she developed GIB s/p colonoscopy 12/26 revealing multiple polyps s/p polypectomy, multible tubular adenomas. Eliquis  was discontinued (she was on the for PE).   Admitted 1/2-1/6 for decompensated heart failure in the setting of missing home diuretics. She was diuresed and and transitioned back to Torsemide  80mg  BID.   Admitted a few days later 06/28/23 with hypoglycemia due to incorrect home insulin  administration, which was corrected in the hospital. AKI at admission that improved at DC.  Admitted 07/10/23 with  SOB/ cough. Had stopped taking insulin  due to her concern about hypoglycemia although glucose levels have been 400-500's. Cr 4.7 (from 3.8), BUN 76, glucose 324, Hgb 8.6  (around baseline), lipase 110, BHB <0.18, VBG 7.33/43, lactate 1.4, pro-BNP 4321. CXR stable. Given IVF with improvement of renal function. Endocrinology consulted.   Admitted 09/2023 for Acute CHF at [facility]. She ran out of her diuretics. Cocaine +. She was diuresed with IV lasix .   Echo 12/2023 LVEF 50-55%, normal RV size and function, DD2, mild MR  She was admitted to [facility] 8/12-8/15 for acute heart failure exacerbation treated with diuretics.   She was seen in the office 04/23/24 at [facility] and was doing great. She has a fistula in place, but not on dialysis yet.   She presented to [facility] ER  05/07/24 for leg swelling for 3 days. She didn't have torsemide  for a week. She restarted it and had minimal urine output. She had swelling in the face, arms, legs, abdomen and legs. No chest pain reported.  In the ER blood pressure 150/59, pulse rate 66 bpm, respiratory rate 16, afebrile, 100% O2.  Noted to have 2+ pitting edema.  Labs showed hemoglobin 6.8, potassium 3.4, serum creatinine 4.23, BUN 60, GFR 11, albumin 3, normal AST and ALT.  EKG showed normal sinus rhythm, 72 bpm, no ischemic changes.  Stool Hemoccult negative.  proBNP greater than 35,000.  UDS positive for cocaine.  Patient was administered 1 unit of blood, IV Lasix  and admitted for further workup.   Past Medical History:  Diagnosis Date   Adenocarcinoma of left lung (HCC)    a.) stage IIIA (pT1c, pN2, cM0) --> s/p LLL lobectomy + 4 cycles of carbo/taxol  + 1 cycle atezolizumab  (discontinued for grade IV toxicity)  Anemia of chronic renal failure    Cerebral microvascular disease    CKD (chronic kidney disease), stage V (HCC)    Cocaine abuse (HCC)    Coronary artery disease    a.) LHC (NSTEMI) 05/30/2010: 10% pLCxm 20% pRCA-1, 100% pRCA-2 - med mgmt; b.) LHC 02/24/2020: CTA pRCA with good L-R collaterals, 50% mLAD, 80% OM1 - med mgmt   Depression    Diabetic foot ulcer (HCC) 10/03/2019   Dyspnea    Elevated LEFT hemidiaphragm    HFrEF  (heart failure with reduced ejection fraction) (HCC) 02/16/2020   a.) Dx'd in setting of acute DVT/PE   Hyperlipidemia    Hypertension    Long-term use of aspirin  therapy    NSTEMI (non-ST elevated myocardial infarction) (HCC) 05/28/2010   a.) troponins were trended: 1.70 --> 3.30 --> 4.90 ng/mL; b.) LHC 05/30/2010: 10% pLCx, 20% pRCA-1, 100% pRCA-2 (chronic) with good collaterals - med mgmt   Peripheral artery disease    a.) 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   Systolic ejection murmur    T2DM (type 2 diabetes mellitus) (HCC)    Venous thromboembolism (VTE) 02/15/2020   a.) 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; CTA (+) for multiple RIGHT segmental/subsegment pulmonary emboli    Past Surgical History:  Procedure Laterality Date   BELOW KNEE LEG AMPUTATION Right 01/06/2020   INSERTION OF ARTERIOVENOUS (AV) ARTEGRAFT ARM Left 01/10/2024   Procedure: INSERTION, GRAFT, ARTERIOVENOUS, UPPER EXTREMITY;  Surgeon: Marea Selinda RAMAN, MD;  Location: [facility] ORS;  Service: Vascular;  Laterality: Left;  BRACHIAL AXILLARY   IR IMAGING GUIDED PORT INSERTION  07/27/2021   LEFT HEART CATH AND CORONARY ANGIOGRAPHY Left    Procedure: LEFT HEART CATHETERIZATION AND CORONARY ANGIOGRAPHY; Location: [facility]; Surgeon: Gerhardt Mana and Zachary Prentice Car, MD   LEFT HEART CATH AND CORONARY ANGIOGRAPHY Left 05/30/2010   Procedure: LEFT HEART CATH AND CORONARY ANGIOGRAPHY; Location: [facility]; Surgeon: Vinie Jude, MD   LEFT LOWER PULMONARY LOBECTOMY AND LYMPH NODE DISSECTION Left 02/06/2021   PORTA CATH INSERTION       Home Medications:  Prior to Admission medications   Medication Sig Start Date End Date Taking? Authorizing Provider  aspirin  EC 81 MG tablet Take 81 mg by mouth in the morning. Swallow whole.   Yes [provider]  atorvastatin  (LIPITOR ) 80 MG tablet Take 1 tablet (80 mg total) by mouth daily. 02/14/24  Yes [place] A, FNP  calcitRIOL (ROCALTROL) 0.25 MCG capsule Take 0.25 mcg by mouth daily. 03/10/24  03/10/25 Yes [provider]  calcium  acetate (PHOSLO) 667 MG capsule Take 667 mg by mouth. 11/23/23 11/22/24 Yes [provider]  carboxymethylcellulose (REFRESH PLUS) 0.5 % SOLN Place 1 drop into both eyes in the morning and at bedtime.   Yes [provider]  carvedilol  (COREG ) 25 MG tablet Take 1 tablet (25 mg total) by mouth 2 (two) times daily. 02/14/24 05/14/24 Yes Hackney, Tina A, FNP  DULoxetine  (CYMBALTA ) 20 MG capsule TAKE 2 CAPSULES (40 MG TOTAL) BY MOUTH 2 (TWO) TIMES DAILY. 03/12/24  Yes Manya Toribio SQUIBB, PA  Glucagon 3 MG/DOSE POWD Use 1 spray in 1 nostril for severe hypoglycemia, as per package instructions 08/31/23  Yes [provider]  hydrALAZINE  (APRESOLINE ) 100 MG tablet Take 1 tablet (100 mg total) by mouth 3 (three) times daily. 02/14/24  Yes [place] A, FNP  insulin  lispro (HUMALOG ) 100 UNIT/ML injection Inject 4 Units into the skin 3 (three) times daily with meals. plus sliding scale before meals Insulin  dose  70-150 0 units 151-200 1 unit, 201-250 2 units, 251-300 3 units, 301-350 4 units, 351+5 units 05/02/24  Yes [provider]  isosorbide  dinitrate (ISORDIL ) 30 MG tablet Take 1 tablet (30 mg total) by mouth 3 (three) times daily. 02/14/24  Yes Hackney, Tina A, FNP  ketoconazole  (NIZORAL ) 2 % cream Apply topically 2 (two) times daily. 03/11/24  Yes Waddell, Daniel P, PA  LANTUS  SOLOSTAR 100 UNIT/ML Solostar Pen Inject 4 Units into the skin at bedtime.   Yes [provider]  torsemide  (DEMADEX ) 20 MG tablet Take 4 tablets (80 mg total) by mouth daily. Take 4 20 mg tabs = 80 mg 02/14/24  Yes Hackney, Ellouise A, FNP  ACCU-CHEK GUIDE TEST test strip 3 (three) times daily. 01/01/24   [provider]  CAREFINE PEN NEEDLES 32G X 4 MM MISC CAREFINE PEN NEEDLES 32G X 4 MM 08/19/18   [provider]  Continuous Glucose Receiver (DEXCOM G7 RECEIVER) DEVI Use as instructed with G7 sensors 08/31/23   [provider]   Continuous Glucose Sensor (DEXCOM G7 SENSOR) MISC Place one sensor on the skin every 10 days for use with Dexcom reader. 02/13/24   Manya Toribio SQUIBB, PA  traMADol  (ULTRAM ) 50 MG tablet Take 1 tablet (50 mg total) by mouth every 8 (eight) hours as needed. Patient not taking: Reported on 05/07/2024 03/18/24   Triplett, Cari B, FNP  Vitamin D, Ergocalciferol, (DRISDOL) 1.25 MG (50000 UNIT) CAPS capsule Take 50,000 Units by mouth. Patient not taking: Reported on 05/07/2024 07/12/23   [provider]    Scheduled Meds:  atorvastatin   80 mg Oral Daily   carvedilol   25 mg Oral BID WC   ezetimibe  10 mg Oral Daily   furosemide   80 mg Intravenous BID   heparin  injection (subcutaneous)  5,000 Units Subcutaneous Q8H   hydrALAZINE   100 mg Oral Q8H   insulin  aspart  4 Units Subcutaneous TID WC   insulin  glargine-yfgn  4 Units Subcutaneous QHS   isosorbide  dinitrate  30 mg Oral TID   Continuous Infusions:  PRN Meds: acetaminophen  **OR** acetaminophen   Allergies:    Allergies  Allergen Reactions   Lisinopril Other (See Comments) and Swelling    Recurrent AKI and hyperkalemia when initiation attempted.  Other Reaction(s): Facial edema  Other Reaction(s): Facial edema, hyperkalemia, Other (See Comments)    Recurrent AKI and hyperkalemia when initiation attempted. Other Reaction(s): Facial edema    Recurrent AKI and hyperkalemia when initiation attempted.  Other Reaction(s): Facial edema, hyperkalemia  Recurrent AKI and hyperkalemia when initiation attempted.  Other Reaction(s): Facial edema, hyperkalemia  Recurrent AKI and hyperkalemia when initiation attempted.  Other Reaction(s): Facial edema, hyperkalemia   Cyanoacrylate Dermatitis    Social History:   Social History   Socioeconomic History   Marital status: Married    Spouse name: Not on file   Number of children: Not on file   Years of education: Not on file   Highest education level: Not on file  Occupational History    Not on file  Tobacco Use   Smoking status: Former    Current packs/day: 0.00    Types: Cigars, Cigarettes    Quit date: 01/17/2021    Years since quitting: 3.3   Smokeless tobacco: Never  Vaping Use   Vaping status: Never Used  Substance and Sexual Activity   Alcohol  use: Never   Drug use: Not Currently   Sexual activity: Not Currently  Other Topics Concern   Not on  file  Social History Narrative   Not on file   Social Drivers of Health   Financial Resource Strain: Low Risk  (06/29/2023)   Received from [facility]   Overall Financial Resource Strain (CARDIA)    Difficulty of Paying Living Expenses: Not hard at all  Food Insecurity: No Food Insecurity (05/08/2024)   Hunger Vital Sign    Worried About Running Out of Food in the Last Year: Never true    Ran Out of Food in the Last Year: Never true  Transportation Needs: No Transportation Needs (05/08/2024)   PRAPARE - Administrator, Civil Service (Medical): No    Lack of Transportation (Non-Medical): No  Physical Activity: Not on file  Stress: Stress Concern Present (12/15/2021)   [employer] of Occupational Health - Occupational Stress Questionnaire    Feeling of Stress : To some extent  Social Connections: Moderately Integrated (05/08/2024)   Social Connection and Isolation Panel    Frequency of Communication with Friends and Family: More than three times a week    Frequency of Social Gatherings with Friends and Family: More than three times a week    Attends Religious Services: More than 4 times per year    Active Member of [employer] or Organizations: Yes    Attends Banker Meetings: More than 4 times per year    Marital Status: Widowed  Intimate Partner Violence: Unknown (05/08/2024)   Humiliation, Afraid, Rape, and Kick questionnaire    Fear of Current or Ex-Partner: No    Emotionally Abused: No    Physically Abused: Not on file    Sexually Abused: No    Family History:     Family History  Problem Relation Age of Onset   Heart disease Mother    Diabetes Mother    Heart disease Father    Diabetes Father      ROS:  Please see the history of present illness.   All other ROS reviewed and negative.     Physical Exam/Data: Vitals:   05/07/24 2238 05/07/24 2313 05/08/24 0432 05/08/24 0756  BP: (!) 156/69 (!) 158/75 (!) 158/69 (!) 144/77  Pulse: 62 64 66 66  Resp: 18 18 18    Temp: 98.3 F (36.8 C) 98 F (36.7 C) 98.3 F (36.8 C) 98.5 F (36.9 C)  TempSrc: Oral     SpO2: 100% 100% 100% 100%  Weight:   56.6 kg   Height:        Intake/Output Summary (Last 24 hours) at 05/08/2024 0944 Last data filed at 05/08/2024 0430 Gross per 24 hour  Intake 449.03 ml  Output 400 ml  Net 49.03 ml      05/08/2024    4:32 AM 05/07/2024    8:02 PM 03/18/2024    5:48 PM  Last 3 Weights  Weight (lbs) 124 lb 12.5 oz 124 lb 12.5 oz 114 lb  Weight (kg) 56.6 kg 56.6 kg 51.71 kg     Body mass index is 20.76 kg/m.  General:  Well nourished, well developed, in no acute distress HEENT: normal Neck: no JVD Vascular: No carotid bruits; Distal pulses 2+ bilaterally Cardiac:  normal S1, S2; RRR; no murmur  Lungs:  clear to auscultation bilaterally, no wheezing, rhonchi or rales  Abd: soft, nontender, no hepatomegaly  Ext: no edema Musculoskeletal:  No deformities, BUE and BLE strength normal and equal Skin: warm and dry  Neuro:  CNs 2-12 intact, no focal abnormalities noted Psych:  Normal  affect   EKG:  The EKG was personally reviewed and demonstrates:  NSR 72bpm, IVCD, nonspecific t wave changes Telemetry:  Telemetry was personally reviewed and demonstrates:  NSR  Relevant CV Studies:  Echo 12/2023 Summary   1. The left ventricle is normal in size with mildly increased wall  thickness.   2. The left ventricular systolic function is borderline, LVEF is visually  estimated at 50-55%.    3. There is grade II diastolic dysfunction (elevated filling  pressure).    4. The right ventricle is normal in size, with normal systolic function.    5. There is mild mitral valve regurgitation.   Echo 05/2023 SEVERE LEFT VENTRICULAR SYSTOLIC DYSFUNCTION WITH MILD LVH  ESTIMATED EF: 25%, CALC EF(3D): 27%  NORMAL LA PRESSURES WITH DIASTOLIC DYSFUNCTION (GRADE 1)  SEVERE RIGHT VENTRICULAR SYSTOLIC DYSFUNCTION  VALVULAR REGURGITATION: TRIVIAL AR, MILD MR, TRIVIAL PR, MODERATE TR  NO VALVULAR STENOSIS  SMALL PERICARDIAL EFFUSION   Echo 12/2020  1. Left ventricular ejection fraction, by estimation, is 60 to 65%. The  left ventricle has normal function. The left ventricle has no regional  wall motion abnormalities. There is mild asymmetric left ventricular  hypertrophy of the basal-septal segment.  Left ventricular diastolic parameters are consistent with Grade I  diastolic dysfunction (impaired relaxation).   2. Right ventricular systolic function is normal. The right ventricular  size is normal. There is normal pulmonary artery systolic pressure. The  estimated right ventricular systolic pressure is 25.3 mmHg.   3. The mitral valve is normal in structure. Trivial mitral valve  regurgitation. No evidence of mitral stenosis.   4. The aortic valve is tricuspid. Aortic valve regurgitation is not  visualized. No aortic stenosis is present.   5. The inferior vena cava is normal in size with greater than 50%  respiratory variability, suggesting right atrial pressure of 3 mmHg.   Cath 02/2020 [facility] 1. Coronary artery disease including occluded proximal RCA (distal vessel  fills via left to right collaterals), 50% mid LAD stenosis and 80% OM1  stenosis.  2. Normal left ventricular filling pressures (LVEDP = 13 mm Hg).   Recommendations:  1. Aggressive secondary prevention.  2. Medical management on CAD and CHF.  3. Follow up with primary cardiologist.   Echo 01/2020  1. The left ventricular systolic function is severely decreased, LVEF is  visually  estimated at 20-25%.    2. There is global hypokinesis with relative akinesis of the basal to apical  segments of the septum.    3. There is grade II diastolic dysfunction (elevated filling pressure).    4. Abnormal ventricular septal motion consistent with RV pressure overload  (systolic flattening).    5. The right ventricle is normal in size, with moderately reduced systolic  function.   6. The right atrium is mildly to moderately dilated  in size.    7. The left atrium is mildly to moderately dilated in size.    8. There is mild mitral valve regurgitation.    9. There is moderate tricuspid regurgitation.    10. There is moderate pulmonary hypertension, estimated pulmonary artery  systolic pressure is 45 mmHg.    11. IVC size and inspiratory change suggest mildly elevated right atrial  pressure. (5-10 mmHg).    12. There is a small pericardial effusion.    13. There is a left pleural effusion and a right pleural effusion present.   Recommendations   * LV and RV dysfunction are new relative to the  prior study in June 2021.   Echo 11/2019  1. The left ventricle is normal in size with mildly increased wall  thickness.   2. The left ventricular systolic function is normal, LVEF is visually  estimated at > 55%.    3. The mitral valve leaflets are mildly thickened with normal leaflet  mobility.   4. There is mild mitral valve regurgitation.    5. The aortic valve is trileaflet with mildly thickened leaflets with normal  excursion.   6. There is mild pulmonary hypertension.    7. The right ventricle is normal in size, with normal systolic function.    8. There is no evidence of an interatrial communication or intrapulmonary  shunt by agitated saline contrast study.   Laboratory Data: High Sensitivity Troponin:  No results for input(s): TROPONINIHS in the last 720 hours.   Chemistry Recent Labs  Lab 05/07/24 1149 05/08/24 0046  NA 142 142  K 3.4* 3.5  CL 112* 113*  CO2 17*  18*  GLUCOSE 214* 220*  BUN 60* 61*  CREATININE 4.23* 4.00*  CALCIUM  8.3* 8.0*  GFRNONAA 11* 12*  ANIONGAP 14 11    Recent Labs  Lab 05/07/24 1536 05/08/24 0046  PROT 5.2* 5.0*  ALBUMIN 3.0* 2.9*  AST 24 19  ALT 17 16  ALKPHOS 118 112  BILITOT <0.2 0.2   Lipids No results for input(s): CHOL, TRIG, HDL, LABVLDL, LDLCALC, CHOLHDL in the last 168 hours.  Hematology Recent Labs  Lab 05/07/24 1149 05/08/24 0046  WBC 5.2 5.4  RBC 2.17* 2.61*  HGB 6.8* 8.0*  HCT 22.3* 25.8*  MCV 102.8* 98.9  MCH 31.3 30.7  MCHC 30.5 31.0  RDW 19.0* 18.0*  PLT 228 200   Thyroid  No results for input(s): TSH, FREET4 in the last 168 hours.  BNP Recent Labs  Lab 05/07/24 1536  PROBNP >35,000.0*    DDimer No results for input(s): DDIMER in the last 168 hours.  Radiology/Studies:  DG Chest Port 1 View Result Date: 05/07/2024 EXAM: 1 VIEW(S) XRAY OF THE CHEST 05/07/2024 03:32:00 PM COMPARISON: 01/29/2024 CLINICAL HISTORY: SOB (shortness of breath) FINDINGS: LINES, TUBES AND DEVICES: Stable right internal jugular Port-A-Cath. LUNGS AND PLEURA: Elevated left hemidiaphragm. Mild left basilar atelectasis or scarring is noted. Minimal left pleural effusion. No pneumothorax. HEART AND MEDIASTINUM: No acute abnormality of the cardiac and mediastinal silhouettes. BONES AND SOFT TISSUES: No acute osseous abnormality. IMPRESSION: 1. Mild left basilar atelectasis or scarring with minimal left pleural effusion. 2. Elevated left hemidiaphragm. Electronically signed by: Lynwood Seip MD 05/07/2024 03:46 PM EST RP Workstation: [id number]     Assessment and Plan:  Acute BiV heart failure HFimpEF Mixed ICM/NICM - long history of heart failure followed by [facility]. H/o medication noncompliance, cocaine use, and CKD stage 4-5 limiting GDMT - presented with LLE after running out of Torsemide  - Echo 12/2023 LVEF 50-55%, G2DD, mild MR - pro-BNP >35000 and CXR with mild left basilar atelectasis or  scarring with minimal left pleural effusion - PTA torsemide  80mg  daily - IV lasix  80mg  BID - UOP - -  Kidney function improving. continue with diuresis.  CKD stage 5 - Scr baseline around 3.5 - scr 4.23 on admission - daily BMET - albumin 2.9 - has AV fistula, but has not started HF  Anemia - Hgb 6.8  s/p 1 unit - Hgb 8 today  HTN - hydralazine  100mg  TID, Imdur 30mg  daily, Coreg  25mg  BID - Bps improving  HLD - PTA lipitor  80mg  daily  and Zetia 10mg  daily continued  Substance use - she is cocaine+, but denies drug use   For questions or updates, please contact  HeartCare Please consult [email or web address] for contact info under      Signed, Tirza Senteno VEAR Fishman, PA-C  05/08/2024 9:44 AM

## 2024-05-08 NOTE — Plan of Care (Signed)

## 2024-05-08 NOTE — TOC Initial Note (Signed)
 Transition of Care [facility]) - Initial/Assessment Note    Patient Details  Name: [name] [name] MRN: [id number] Date of Birth: 07/16/1955  Transition of Care [facility]) CM/SW Contact:    Lauraine JAYSON Carpen, LCSW Phone Number: 05/08/2024, 2:29 PM  Clinical Narrative:    Readmission prevention screen complete. CSW met with patient. No family at bedside. CSW introduced role and explained that discharge planning would be discussed. PCP is Toribio Hoyle, PA. She takes an Pharmacist, Community to appointments and sometimes gets transport through her insurance. Patient has Medicaid so CSW encouraged her to call DSS to set up Medicaid Transportation. Will add this information to her AVS as well. [facility] [place] She reports difficulty affording medications. Sent secure chat to pharmacist to see if there are any cheaper alternatives. Patient lives home with her aunt, niece, nephew, their friends, and friends' daughters. No home health prior to admission. She has a wheelchair, shower chair, and her prosthetic at home. No further concerns. CSW will continue to follow patient for support and facilitate return home once stable. She will call an G[place]le or her cousin to pick her up at discharge.              Expected Discharge Plan: Home/Self Care Barriers to Discharge: Continued Medical Work up   Patient Goals and CMS Choice            Expected Discharge Plan and Services     Post Acute Care Choice: NA Living arrangements for the past 2 months: Single Family Home                                      Prior Living Arrangements/Services Living arrangements for the past 2 months: Single Family Home Lives with:: Relatives Patient language and need for interpreter reviewed:: Yes Do you feel safe going back to the place where you live?: Yes      Need for Family Participation in Patient Care: Yes (Comment) Care giver support system in place?: Yes (comment) Current home services: DME Criminal Activity/Legal  Involvement Pertinent to Current Situation/Hospitalization: No - Comment as needed  Activities of Daily Living      Permission Sought/Granted                  Emotional Assessment Appearance:: Appears stated age Attitude/Demeanor/Rapport: Engaged, Gracious Affect (typically observed): Accepting, Appropriate, Calm, Pleasant Orientation: : Oriented to Self, Oriented to Place, Oriented to  Time, Oriented to Situation Alcohol  / Substance Use: Not Applicable Psych Involvement: No (comment)  Admission diagnosis:  CHF (congestive heart failure) (HCC) [I50.9] Symptomatic anemia [D64.9] Acute on chronic congestive heart failure, unspecified heart failure type [facility]) [I50.9] Patient Active Problem List   Diagnosis Date Noted   Hypertensive heart and chronic kidney disease with heart failure and with stage 5 chronic kidney disease, or end stage renal disease (HCC) 05/08/2024   Frequent falls 02/06/2024   Onychomycosis of left great toe 02/06/2024   Primary hypertension 02/02/2024   Coronary artery disease of native artery of native heart with stable angina pectoris 02/02/2024   Dyspnea 01/30/2024   Shortness of breath 01/30/2024   Acute on chronic diastolic CHF (congestive heart failure) (HCC) 01/30/2024   CHF (congestive heart failure) (HCC) 01/29/2024   Ischemic cardiomyopathy 11/29/2023   Microalbuminuric diabetic nephropathy (HCC) 11/29/2023   Chronic systolic heart failure (HCC) 11/29/2023   Aortic atherosclerosis 11/29/2023   Emphysema/COPD 11/29/2023  Osteopenia after menopause 11/29/2023   Systolic murmur 11/29/2023   Status post below-knee amputation of right lower extremity (HCC) 08/31/2023   Hypoglycemia 06/29/2023   GIB (gastrointestinal bleeding) 06/12/2023   Acute deep vein thrombosis (DVT) of right upper extremity (HCC) 06/02/2023   Nephrotic syndrome 05/30/2023   Syncope 05/28/2023   Sinus bradycardia 05/28/2023   Vitamin B12 deficiency 07/10/2022   Port-A-Cath  in place 07/10/2022   Liver function test abnormality    Acute renal failure superimposed on stage 5 chronic kidney disease, not on chronic dialysis (HCC) 08/03/2021   Transaminitis 08/02/2021   Chemotherapy-induced nausea 07/28/2021   Macrocytic anemia 07/07/2021   Moderate protein-calorie malnutrition 04/12/2021   Anemia due to stage 5 chronic kidney disease, not on chronic dialysis (HCC) 04/12/2021   Symptomatic anemia 04/12/2021   Non-small cell cancer of left lung (HCC) 02/17/2021   Tinea pedis of both feet 02/10/2021   Chronic pain due to neoplasm 02/10/2021   Adenocarcinoma, lung, left (HCC) 01/19/2021   S/P Robotic Assisted Video Thoracoscopy with Left Lower Lobectomy Lung, Intercostal nerve block, lymph node dissection 01/17/2021   Non-small cell lung cancer (HCC) 12/30/2020   Depression 12/09/2020   Hyperlipidemia 12/09/2020   Malignant hypertension 12/09/2020   Heart failure with reduced ejection fraction (HCC) 02/16/2020   History of pulmonary embolism 02/15/2020   Iron  deficiency anemia 12/18/2019   Cocaine use 08/17/2019   PAD (peripheral artery disease) 08/17/2019   Tobacco use disorder 08/17/2019   Tobacco abuse, in remission 08/17/2019   Cervical dysplasia 04/02/2019   Neuropathy, peripheral 01/06/2019   Peripheral arterial occlusive disease 11/26/2018   Crack cocaine use 11/16/2016   Nicotine dependence, uncomplicated 11/26/2014   Arteriosclerosis of coronary artery 06/06/2010   Type 2 diabetes mellitus with stage 5 chronic kidney disease not on chronic dialysis, with long-term current use of insulin  (HCC) 06/06/2010   History of MI (myocardial infarction) 06/05/2010   PCP:  Manya Toribio SQUIBB, PA Pharmacy:   [place] - [place], [place] - [address] [place] [address] Phone: [phone] Fax: [phone]  [facility] [address], [place] - [address] [place] [address] Phone: [phone] Fax:  [phone]  [place] [facility] [address] [place] [address] Phone: [phone] Fax: [phone]     Social Drivers of Health (SDOH) Social History: SDOH Screenings   Food Insecurity: No Food Insecurity (05/08/2024)  Housing: Low Risk  (05/08/2024)  Transportation Needs: No Transportation Needs (05/08/2024)  Utilities: Not At Risk (05/08/2024)  Depression (PHQ2-9): High Risk (02/06/2024)  Financial Resource Strain: Low Risk  (06/29/2023)   Received from [facility]  Social Connections: Moderately Integrated (05/08/2024)  Stress: Stress Concern Present (12/15/2021)  Tobacco Use: Medium Risk (05/07/2024)   SDOH Interventions:     Readmission Risk Interventions    05/08/2024    2:20 PM  Readmission Risk Prevention Plan  Transportation Screening Complete  Medication Review (RN Care Manager) Referral to Pharmacy  PCP or Specialist appointment within 3-5 days of discharge Complete  SW Recovery Care/Counseling Consult Complete  Palliative Care Screening Not Applicable  Skilled Nursing Facility Not Applicable

## 2024-05-09 DIAGNOSIS — N184 Chronic kidney disease, stage 4 (severe): Secondary | ICD-10-CM

## 2024-05-09 DIAGNOSIS — I132 Hypertensive heart and chronic kidney disease with heart failure and with stage 5 chronic kidney disease, or end stage renal disease: Secondary | ICD-10-CM | POA: Diagnosis not present

## 2024-05-09 DIAGNOSIS — I739 Peripheral vascular disease, unspecified: Secondary | ICD-10-CM | POA: Diagnosis not present

## 2024-05-09 DIAGNOSIS — I5033 Acute on chronic diastolic (congestive) heart failure: Secondary | ICD-10-CM

## 2024-05-09 DIAGNOSIS — I25118 Atherosclerotic heart disease of native coronary artery with other forms of angina pectoris: Secondary | ICD-10-CM | POA: Diagnosis not present

## 2024-05-09 DIAGNOSIS — E782 Mixed hyperlipidemia: Secondary | ICD-10-CM

## 2024-05-09 DIAGNOSIS — I509 Heart failure, unspecified: Secondary | ICD-10-CM | POA: Diagnosis not present

## 2024-05-09 DIAGNOSIS — I5032 Chronic diastolic (congestive) heart failure: Secondary | ICD-10-CM | POA: Diagnosis not present

## 2024-05-09 LAB — BASIC METABOLIC PANEL WITH GFR
Anion gap: 10 (ref 5–15)
BUN: 60 mg/dL — ABNORMAL HIGH (ref 8–23)
CO2: 20 mmol/L — ABNORMAL LOW (ref 22–32)
Calcium: 8.1 mg/dL — ABNORMAL LOW (ref 8.9–10.3)
Chloride: 109 mmol/L (ref 98–111)
Creatinine, Ser: 3.89 mg/dL — ABNORMAL HIGH (ref 0.44–1.00)
GFR, Estimated: 12 mL/min — ABNORMAL LOW (ref >60)
Glucose, Bld: 170 mg/dL — ABNORMAL HIGH (ref 70–99)
Potassium: 3.4 mmol/L — ABNORMAL LOW (ref 3.5–5.1)
Sodium: 140 mmol/L (ref 135–145)

## 2024-05-09 LAB — CBC
HCT: 25.6 % — ABNORMAL LOW (ref 36.0–46.0)
Hemoglobin: 8.1 g/dL — ABNORMAL LOW (ref 12.0–15.0)
MCH: 31 pg (ref 26.0–34.0)
MCHC: 31.6 g/dL (ref 30.0–36.0)
MCV: 98.1 fL (ref 80.0–100.0)
Platelets: 215 K/uL (ref 150–400)
RBC: 2.61 MIL/uL — ABNORMAL LOW (ref 3.87–5.11)
RDW: 18.3 % — ABNORMAL HIGH (ref 11.5–15.5)
WBC: 6.3 K/uL (ref 4.0–10.5)
nRBC: 0 % (ref 0.0–0.2)

## 2024-05-09 LAB — GLUCOSE, CAPILLARY
Glucose-Capillary: 228 mg/dL — ABNORMAL HIGH (ref 70–99)
Glucose-Capillary: 301 mg/dL — ABNORMAL HIGH (ref 70–99)
Glucose-Capillary: 326 mg/dL — ABNORMAL HIGH (ref 70–99)
Glucose-Capillary: 122 mg/dL — ABNORMAL HIGH (ref 70–99)
Glucose-Capillary: 128 mg/dL — ABNORMAL HIGH (ref 70–99)

## 2024-05-09 MED ORDER — POTASSIUM CHLORIDE CRYS ER 20 MEQ PO TBCR
40.0000 meq | EXTENDED_RELEASE_TABLET | Freq: Once | ORAL | Status: AC
  Administered 2024-05-09: 40 meq via ORAL
  Filled 2024-05-09: qty 2

## 2024-05-09 MED ORDER — ASPIRIN 81 MG PO CHEW
81.0000 mg | CHEWABLE_TABLET | Freq: Every day | ORAL | Status: DC
  Administered 2024-05-09 – 2024-05-13 (×5): 81 mg via ORAL
  Filled 2024-05-09 (×5): qty 1

## 2024-05-09 MED ORDER — DULOXETINE HCL 20 MG PO CPEP
40.0000 mg | ORAL_CAPSULE | Freq: Two times a day (BID) | ORAL | Status: DC
  Administered 2024-05-09 – 2024-05-13 (×9): 40 mg via ORAL
  Filled 2024-05-09 (×9): qty 2

## 2024-05-09 NOTE — Progress Notes (Signed)
 Approximately 1500--Pt arrived to room 205A. Upon arrival, VS obtained and full assessment completed by this RN. Pt oriented to room and unit. All fall precautions in place.

## 2024-05-09 NOTE — Progress Notes (Signed)
 [facility]  ROUNDING NOTE   Subjective:   [name] [name]  is a 68 y.o.  female  with medical history to include CAD, diabetes, cocaine abuse, depression. HTN, lung ca with LLL lobectomy and chronic kidney disease stage 4. Patient presents to the ED with lower extremity edema and has been admitted for CHF (congestive heart failure) (HCC) [I50.9] Symptomatic anemia [D64.9] Acute on chronic congestive heart failure, unspecified heart failure type [facility]) [I50.9]  Patient is known to our practice and follows with Dr Marcelino, was seen in office on 03/10/24.   Patient seen sitting up in bed Remains on room air Edema slowly improving   Objective:  Vital signs in last 24 hours:  Temp:  [98 F (36.7 C)-98.8 F (37.1 C)] 98.8 F (37.1 C) (11/21 1148) Pulse Rate:  [67-79] 74 (11/21 1148) Resp:  [16-17] 16 (11/21 1148) BP: (131-163)/(54-69) 137/60 (11/21 1148) SpO2:  [98 %-100 %] 100 % (11/21 1148) Weight:  [56.5 kg] 56.5 kg (11/21 0434)  Weight change: -0.1 kg Filed Weights   05/07/24 2002 05/08/24 0432 05/09/24 0434  Weight: 56.6 kg 56.6 kg 56.5 kg    Intake/Output: I/O last 3 completed shifts: In: 979 [P.O.:650; Blood:329] Out: 2650 [Urine:2650]   Intake/Output this shift:  Total I/O In: 240 [P.O.:240] Out: -   Physical Exam: General: NAD  Head: Normocephalic, atraumatic. Moist oral mucosal membranes  Eyes: Anicteric  Lungs:  Clear to auscultation, normal effort  Heart: Regular rate and rhythm  Abdomen:  Soft, nontender  Extremities: 1+ peripheral edema.  Right AKA  Neurologic: Awake, alert, conversant  Skin: Warm,dry, no rash  Access: Left AVF    Basic Metabolic Panel: Recent Labs  Lab 05/07/24 1149 05/08/24 0046 05/09/24 0330  NA 142 142 140  K 3.4* 3.5 3.4*  CL 112* 113* 109  CO2 17* 18* 20*  GLUCOSE 214* 220* 170*  BUN 60* 61* 60*  CREATININE 4.23* 4.00* 3.89*  CALCIUM  8.3* 8.0* 8.1*    Liver Function Tests: Recent Labs  Lab  05/07/24 1536 05/08/24 0046  AST 24 19  ALT 17 16  ALKPHOS 118 112  BILITOT <0.2 0.2  PROT 5.2* 5.0*  ALBUMIN 3.0* 2.9*   No results for input(s): LIPASE, AMYLASE in the last 168 hours. No results for input(s): AMMONIA in the last 168 hours.  CBC: Recent Labs  Lab 05/07/24 1149 05/08/24 0046 05/09/24 0330  WBC 5.2 5.4 6.3  HGB 6.8* 8.0* 8.1*  HCT 22.3* 25.8* 25.6*  MCV 102.8* 98.9 98.1  PLT 228 200 215    Cardiac Enzymes: No results for input(s): CKTOTAL, CKMB, CKMBINDEX, TROPONINI in the last 168 hours.  BNP: Invalid input(s): POCBNP  CBG: Recent Labs  Lab 05/08/24 1608 05/08/24 2202 05/09/24 0735 05/09/24 1142 05/09/24 1143  GLUCAP 95 200* 228* 326* 301*    Microbiology: Results for orders placed or performed during the hospital encounter of 01/29/24  C Difficile Quick Screen w PCR reflex     Status: None   Collection Time: 01/31/24  1:38 PM   Specimen: STOOL  Result Value Ref Range Status   C Diff antigen NEGATIVE NEGATIVE Final   C Diff toxin NEGATIVE NEGATIVE Final   C Diff interpretation No C. difficile detected.  Final    Comment: Performed at [facility], [address] [place], [place] [address]    Coagulation Studies: Recent Labs    05/07/24 1536  LABPROT 15.3*  INR 1.1    Urinalysis: Recent Labs  05/07/24 2035  COLORURINE STRAW*  LABSPEC 1.009  PHURINE 7.0  GLUCOSEU 150*  HGBUR NEGATIVE  BILIRUBINUR NEGATIVE  KETONESUR NEGATIVE  PROTEINUR 100*  NITRITE NEGATIVE  LEUKOCYTESUR NEGATIVE      Imaging: DG Chest Port 1 View Result Date: 05/07/2024 EXAM: 1 VIEW(S) XRAY OF THE CHEST 05/07/2024 03:32:00 PM COMPARISON: 01/29/2024 CLINICAL HISTORY: SOB (shortness of breath) FINDINGS: LINES, TUBES AND DEVICES: Stable right internal jugular Port-A-Cath. LUNGS AND PLEURA: Elevated left hemidiaphragm. Mild left basilar atelectasis or scarring is noted. Minimal left pleural effusion. No pneumothorax. HEART AND  MEDIASTINUM: No acute abnormality of the cardiac and mediastinal silhouettes. BONES AND SOFT TISSUES: No acute osseous abnormality. IMPRESSION: 1. Mild left basilar atelectasis or scarring with minimal left pleural effusion. 2. Elevated left hemidiaphragm. Electronically signed by: Lynwood Seip MD 05/07/2024 03:46 PM EST RP Workstation: [id number]     Medications:     atorvastatin   80 mg Oral Daily   carvedilol   25 mg Oral BID WC   DULoxetine   40 mg Oral BID   ezetimibe   10 mg Oral Daily   furosemide   80 mg Intravenous BID   heparin  injection (subcutaneous)  5,000 Units Subcutaneous Q8H   hydrALAZINE   100 mg Oral Q8H   insulin  aspart  0-5 Units Subcutaneous QHS   insulin  aspart  0-6 Units Subcutaneous TID WC   insulin  aspart  4 Units Subcutaneous TID WC   insulin  glargine-yfgn  4 Units Subcutaneous QHS   isosorbide  dinitrate  30 mg Oral TID   acetaminophen  **OR** acetaminophen   Assessment/ Plan:  [name] is a 68 y.o.  female  with medical history to include CAD, diabetes, cocaine abuse, depression. HTN, lung ca with LLL lobectomy and chronic kidney disease stage 4.  Acute Kidney Injury on chronic kidney disease stage 5 with baseline creatinine 3.48 and GFR of 14 on 03/10/2024.  Acute kidney injury secondary to volume overload, likely cardiorenal component.  Prescribed outpatient diuretics, reports compliance.  Has AVF in place, bruit and thrill present.    Creatinine stable today   Lab Results  Component Value Date   CREATININE 3.89 (H) 05/09/2024   CREATININE 4.00 (H) 05/08/2024   CREATININE 4.23 (H) 05/07/2024    Intake/Output Summary (Last 24 hours) at 05/09/2024 1250 Last data filed at 05/09/2024 0959 Gross per 24 hour  Intake 530 ml  Output 2250 ml  Net -1720 ml   2.  Acute on chronic diastolic heart failure.  Last echo on 12/2023 shows EF 50 to 55% with a grade 2 diastolic dysfunction with mitral regurgitation.  Chest x-ray shows mild left basilar atelectasis  versus minimal left small pleural effusion.  Prescribed torsemide  80 mg daily outpatient.    Will continue scheduled IV Furosemide  as scheduled. May consider transition to oral diuretics tomorrow.   3. Anemia of chronic kidney disease Lab Results  Component Value Date   HGB 8.1 (L) 05/09/2024    Hemoglobin stable, 8.1.  Will continue to monitor for need of ESA.    4.  Hypertension with chronic kidney disease.  Currently receiving hydralazine , isosorbide , and carvedilol .  Also prescribed IV furosemide  as above.   LOS: 2 Miro Balderson 11/21/202512:50 PM

## 2024-05-09 NOTE — NC FL2 (Signed)
 [place]  MEDICAID FL2 LEVEL OF CARE FORM     IDENTIFICATION  Patient Name: [name] [name] Birthdate: 03-Dec-1955 Sex: female Admission Date (Current Location): 05/07/2024  [facility] and [place] Number:  Chiropodist and Address:         Provider Number: [id number]  Attending Physician Name and Address:  Maree Hue, MD  Relative Name and Phone Number:       Current Level of Care: Hospital Recommended Level of Care: Skilled Nursing Facility Prior Approval Number:    Date Approved/Denied:   PASRR Number: [id number]  Discharge Plan: SNF    Current Diagnoses: Patient Active Problem List   Diagnosis Date Noted   Hypertensive heart and chronic kidney disease with heart failure and with stage 5 chronic kidney disease, or end stage renal disease (HCC) 05/08/2024   Frequent falls 02/06/2024   Onychomycosis of left great toe 02/06/2024   Primary hypertension 02/02/2024   Coronary artery disease of native artery of native heart with stable angina pectoris 02/02/2024   Dyspnea 01/30/2024   Shortness of breath 01/30/2024   Acute on chronic diastolic CHF (congestive heart failure) (HCC) 01/30/2024   CHF (congestive heart failure) (HCC) 01/29/2024   Ischemic cardiomyopathy 11/29/2023   Microalbuminuric diabetic nephropathy (HCC) 11/29/2023   Chronic systolic heart failure (HCC) 11/29/2023   Aortic atherosclerosis 11/29/2023   Emphysema/COPD 11/29/2023   Osteopenia after menopause 11/29/2023   Systolic murmur 11/29/2023   Status post below-knee amputation of right lower extremity (HCC) 08/31/2023   Hypoglycemia 06/29/2023   GIB (gastrointestinal bleeding) 06/12/2023   Acute deep vein thrombosis (DVT) of right upper extremity (HCC) 06/02/2023   Nephrotic syndrome 05/30/2023   Syncope 05/28/2023   Sinus bradycardia 05/28/2023   Vitamin B12 deficiency 07/10/2022   Port-A-Cath in place 07/10/2022   Liver function test abnormality    Acute renal failure superimposed on  stage 5 chronic kidney disease, not on chronic dialysis (HCC) 08/03/2021   Transaminitis 08/02/2021   Chemotherapy-induced nausea 07/28/2021   Macrocytic anemia 07/07/2021   Moderate protein-calorie malnutrition 04/12/2021   Anemia due to stage 5 chronic kidney disease, not on chronic dialysis (HCC) 04/12/2021   Symptomatic anemia 04/12/2021   Non-small cell cancer of left lung (HCC) 02/17/2021   Tinea pedis of both feet 02/10/2021   Chronic pain due to neoplasm 02/10/2021   Adenocarcinoma, lung, left (HCC) 01/19/2021   S/P Robotic Assisted Video Thoracoscopy with Left Lower Lobectomy Lung, Intercostal nerve block, lymph node dissection 01/17/2021   Non-small cell lung cancer (HCC) 12/30/2020   Depression 12/09/2020   Hyperlipidemia 12/09/2020   Malignant hypertension 12/09/2020   Heart failure with reduced ejection fraction (HCC) 02/16/2020   History of pulmonary embolism 02/15/2020   Iron  deficiency anemia 12/18/2019   Cocaine use 08/17/2019   PAD (peripheral artery disease) 08/17/2019   Tobacco use disorder 08/17/2019   Tobacco abuse, in remission 08/17/2019   Cervical dysplasia 04/02/2019   Neuropathy, peripheral 01/06/2019   Peripheral arterial occlusive disease 11/26/2018   Crack cocaine use 11/16/2016   Nicotine dependence, uncomplicated 11/26/2014   Arteriosclerosis of coronary artery 06/06/2010   Type 2 diabetes mellitus with stage 5 chronic kidney disease not on chronic dialysis, with long-term current use of insulin  (HCC) 06/06/2010   History of MI (myocardial infarction) 06/05/2010    Orientation RESPIRATION BLADDER Height & Weight     Self, Time, Situation, Place  Normal Continent Weight: 56.5 kg Height:  5' 5 (165.1 cm)  BEHAVIORAL SYMPTOMS/MOOD NEUROLOGICAL BOWEL NUTRITION STATUS  Incontinent Diet (heart healthy carb modifed)  AMBULATORY STATUS COMMUNICATION OF NEEDS Skin   Extensive Assist Verbally Skin abrasions                       Personal  Care Assistance Level of Assistance              Functional Limitations Info             SPECIAL CARE FACTORS FREQUENCY  PT (By licensed PT), OT (By licensed OT)                    Contractures Contractures Info: Not present    Additional Factors Info  Code Status, Allergies Code Status Info: full Allergies Info: : Lisinopril, Cyanoacrylate           Current Medications (05/09/2024):  This is the current hospital active medication list Current Facility-Administered Medications  Medication Dose Route Frequency Provider Last Rate Last Admin   acetaminophen  (TYLENOL ) tablet 650 mg  650 mg Oral Q6H PRN Cindy Garnette POUR, MD   650 mg at 05/09/24 1157   Or   acetaminophen  (TYLENOL ) suppository 650 mg  650 mg Rectal Q6H PRN Cindy Garnette POUR, MD       atorvastatin  (LIPITOR ) tablet 80 mg  80 mg Oral Daily Cindy Garnette POUR, MD   80 mg at 05/09/24 1044   carvedilol  (COREG ) tablet 25 mg  25 mg Oral BID WC Cindy Garnette POUR, MD   25 mg at 05/09/24 1044   DULoxetine  (CYMBALTA ) DR capsule 40 mg  40 mg Oral BID Maree Hue, MD       ezetimibe  (ZETIA ) tablet 10 mg  10 mg Oral Daily Cindy Garnette POUR, MD   10 mg at 05/09/24 1045   furosemide  (LASIX ) injection 80 mg  80 mg Intravenous BID Cindy Garnette POUR, MD   80 mg at 05/09/24 1045   heparin  injection 5,000 Units  5,000 Units Subcutaneous Q8H Cindy Garnette POUR, MD   5,000 Units at 05/09/24 1334   hydrALAZINE  (APRESOLINE ) tablet 100 mg  100 mg Oral Q8H Cindy Garnette POUR, MD   100 mg at 05/09/24 1334   insulin  aspart (novoLOG ) injection 0-5 Units  0-5 Units Subcutaneous QHS Cleatus Delayne GAILS, MD       insulin  aspart (novoLOG ) injection 0-6 Units  0-6 Units Subcutaneous TID WC Shah, Vipul, MD   4 Units at 05/09/24 1157   insulin  aspart (novoLOG ) injection 4 Units  4 Units Subcutaneous TID WC Chiu, Stephen K, MD   4 Units at 05/09/24 1156   insulin  glargine-yfgn (SEMGLEE ) injection 4 Units  4 Units Subcutaneous QHS Merrill, Kristin A, RPH   4 Units at  05/08/24 2146   isosorbide  dinitrate (ISORDIL ) tablet 30 mg  30 mg Oral TID Cindy Garnette POUR, MD   30 mg at 05/09/24 1044   Facility-Administered Medications Ordered in Other Encounters  Medication Dose Route Frequency Provider Last Rate Last Admin   heparin  lock flush 100 UNIT/ML injection            heparin  lock flush 100 unit/mL  500 Units Intravenous Once Yu, Zhou, MD         Discharge Medications: Please see discharge summary for a list of discharge medications.  Relevant Imaging Results:  Relevant Lab Results:   Additional Information ss [phone]  Corean ONEIDA Haddock, RN

## 2024-05-09 NOTE — Progress Notes (Signed)
 PROGRESS NOTE    [name] [name]  FMW:[id number] DOB: [birth year] DOA: 05/07/2024 PCP: [name] Toribio SQUIBB, PA   Brief Narrative:   68 y.o. female with medical history significant of lung cancer, CKD5, DM, HTN, PAD s/p R BKA, chronic systolic Chf who presents to the ED with worseining LE edema. Pt reportedly ran out of diuretic for over one week. Pt resumed home torsemide  on 11/17, however LE remained markedly edematous, unable to fit prosthetic leg. Pt reports over 30lb wt gain in <1 week. Decreased urine output.   11/20: Cardio, Nephro, Palliative care, DM nurse c/s 11/21: PT, OT eval. DC tele, transfer to med-surg   Assessment & Plan:   Principal Problem:   CHF (congestive heart failure) (HCC) Active Problems:   PAD (peripheral artery disease)   Symptomatic anemia   Coronary artery disease of native artery of native heart with stable angina pectoris   Hypertensive heart and chronic kidney disease with heart failure and with stage 5 chronic kidney disease, or end stage renal disease (HCC)   Acutely decompensated diastolic CHF exacerbation - EF improved on echo July 2025 EF 50 to 55%  - Acute worsening likely as she ran out of her Diuretic/Torsemide  for 1 week and has not taken it - Cardio following - Continue Lasix  80 mg IV bid and transition to PO Torsemide  tomorrow    Anemia of CKD - s/p 1 PRBC transfusion for Hb 6.8->8.1   Acute on CKD 5 -Nephro following - baseline creat around 3.5 and on admission 4.23->3.89   DM -cont SSI -cont home regimen   HTN -BP stable -Cont home regimen   PAD -s/p RLE BKA, start asa  GOC Palliative care c/s  UDS + for cocaine   DVT prophylaxis: Heparin  heparin  injection 5,000 Units Start: 05/07/24 2230     Code Status: Full code Family Communication: None at bedside Disposition Plan: possible D/C in 1-2 days depending on clinical condition, Nephro & cardio w/up. PT, OT recommends SNF, ICM working on placement   Consultants:   Nephro Cardio Palliative care    Subjective:  Feels better, less SOB  Objective: Vitals:   05/09/24 0434 05/09/24 0733 05/09/24 1148 05/09/24 1451  BP:  (!) 158/65 137/60 (!) 141/63  Pulse:  79 74 72  Resp:   16 18  Temp:  98.5 F (36.9 C) 98.8 F (37.1 C) 98.5 F (36.9 C)  TempSrc:   Oral Oral  SpO2:  100% 100% 100%  Weight: 56.5 kg     Height:        Intake/Output Summary (Last 24 hours) at 05/09/2024 1935 Last data filed at 05/09/2024 1827 Gross per 24 hour  Intake 830 ml  Output 3150 ml  Net -2320 ml   Filed Weights   05/07/24 2002 05/08/24 0432 05/09/24 0434  Weight: 56.6 kg 56.6 kg 56.5 kg    Examination:  General exam: Appears calm and comfortable  Respiratory system: Clear to auscultation. Respiratory effort normal. Cardiovascular system: S1 & S2 heard, RRR. Trace pedal edema. Gastrointestinal system: Abdomen is soft, benign Central nervous system: Alert and oriented. No focal neurological deficits. Extremities: Rt BKA HD Access: Left AVF  Psychiatry: Judgement and insight appear normal. Mood & affect appropriate.     Data Reviewed: I have personally reviewed following labs and imaging studies  CBC: Recent Labs  Lab 05/07/24 1149 05/08/24 0046 05/09/24 0330  WBC 5.2 5.4 6.3  HGB 6.8* 8.0* 8.1*  HCT 22.3* 25.8* 25.6*  MCV 102.8* 98.9 98.1  PLT 228 200 215   Basic Metabolic Panel: Recent Labs  Lab 05/07/24 1149 05/08/24 0046 05/09/24 0330  NA 142 142 140  K 3.4* 3.5 3.4*  CL 112* 113* 109  CO2 17* 18* 20*  GLUCOSE 214* 220* 170*  BUN 60* 61* 60*  CREATININE 4.23* 4.00* 3.89*  CALCIUM  8.3* 8.0* 8.1*   GFR: Estimated Creatinine Clearance: 12.7 mL/min (A) (by C-G formula based on SCr of 3.89 mg/dL (H)). Liver Function Tests: Recent Labs  Lab 05/07/24 1536 05/08/24 0046  AST 24 19  ALT 17 16  ALKPHOS 118 112  BILITOT <0.2 0.2  PROT 5.2* 5.0*  ALBUMIN 3.0* 2.9*   No results for input(s): LIPASE, AMYLASE in the last  168 hours. No results for input(s): AMMONIA in the last 168 hours. Coagulation Profile: Recent Labs  Lab 05/07/24 1536  INR 1.1   Cardiac Enzymes: No results for input(s): CKTOTAL, CKMB, CKMBINDEX, TROPONINI in the last 168 hours. BNP (last 3 results) Recent Labs    05/07/24 1536  PROBNP >35,000.0*   HbA1C: No results for input(s): HGBA1C in the last 72 hours. CBG: Recent Labs  Lab 05/08/24 2202 05/09/24 0735 05/09/24 1142 05/09/24 1143 05/09/24 1657  GLUCAP 200* 228* 326* 301* 122*    Anemia Panel: Recent Labs    05/08/24 0046  VITAMINB12 252  FOLATE 8.7  TIBC 218*  IRON  63    Radiology Studies: No results found.       Scheduled Meds:  atorvastatin   80 mg Oral Daily   carvedilol   25 mg Oral BID WC   DULoxetine   40 mg Oral BID   ezetimibe   10 mg Oral Daily   furosemide   80 mg Intravenous BID   heparin  injection (subcutaneous)  5,000 Units Subcutaneous Q8H   hydrALAZINE   100 mg Oral Q8H   insulin  aspart  0-5 Units Subcutaneous QHS   insulin  aspart  0-6 Units Subcutaneous TID WC   insulin  aspart  4 Units Subcutaneous TID WC   insulin  glargine-yfgn  4 Units Subcutaneous QHS   isosorbide  dinitrate  30 mg Oral TID   Continuous Infusions:   LOS: 2 days    Time spent: 35 mins    [name], MD [facility] Pager [phone]  If 7PM-7AM, please contact night-coverage [email or web address]  05/09/2024, 7:35 PM

## 2024-05-09 NOTE — Telephone Encounter (Signed)
 Requested medications are due for refill today.  Unsure - pt is currently hospitalized. Humalog , Glucagon and Phoslo are historical.  Ketoconazole  is not delegated. Other [facility]s [facility]  Requested medications are on the active medications list.  yes  Last refill. varied  Future visit scheduled.   yes  Notes to clinic.  Pt is requesting all of these meds ne [facility]. Please review for refill.    Requested Prescriptions  Pending Prescriptions Disp Refills   ketoconazole  (NIZORAL ) 2 % cream 60 g 0    Sig: Apply topically 2 (two) times daily.     Not Delegated - Over the Counter: OTC 2 Failed - 05/09/2024 10:54 AM      Failed - This refill cannot be delegated      Passed - Valid encounter within last 12 months    Recent Outpatient Visits           3 months ago Chronic systolic congestive heart failure [facility])   [place] [facility] at [facility], Toribio [place]   5 months ago Encounter for screening mammogram for breast cancer   [place] [facility] at [facility], Tor[place]   5 months ago Type 2 diabetes mellitus with stage 5 chronic kidney disease not on chronic dialysis, with long-term current use of insulin  [facility])   [place] [facility] at [facility], Toribio SQUIBB, [place]               torsemide  (DEMADEX ) 20 MG tablet 120 tablet 5    Sig: Take 4 tablets (80 mg total) by mouth daily. Take 4 20 mg tabs = 80 mg     Cardiovascular:  Diuretics - Loop Failed - 05/09/2024 10:54 AM      Failed - K in normal range and within 180 days    Potassium  Date Value Ref Range Status  05/09/2024 3.4 (L) 3.5 - 5.1 mmol/L Final  04/02/2014 3.8 3.5 - 5.1 mmol/L Final         Failed - Ca in normal range and within 180 days    Calcium   Date Value Ref Range Status  05/09/2024 8.1 (L) 8.9 - 10.3 mg/dL Final   Calcium , Total  Date Value  Ref Range Status  04/02/2014 8.1 (L) 8.5 - 10.1 mg/dL Final         Failed - Cr in normal range and within 180 days    Creatinine  Date Value Ref Range Status  04/24/2023 3.36 (H) 0.44 - 1.00 mg/dL Final  89/84/7984 9.24 0.60 - 1.30 mg/dL Final   Creatinine, Ser  Date Value Ref Range Status  05/09/2024 3.89 (H) 0.44 - 1.00 mg/dL Final   Creatinine, Urine  Date Value Ref Range Status  05/26/2023 96 mg/dL Final    Comment:    Performed at [facility] [address] [place], [place] [address]         Failed - Mg Level in normal range and within 180 days    Magnesium   Date Value Ref Range Status  08/25/2021 2.3 1.6 - 2.3 mg/dL Final         Failed - Last BP in normal range    BP Readings from Last 1 Encounters:  05/09/24 (!) 158/65         Passed - Na in normal range and within 180  days    Sodium  Date Value Ref Range Status  05/09/2024 140 135 - 145 mmol/L Final  07/09/2023 135 134 - 144 mmol/L Final  04/02/2014 141 136 - 145 mmol/L Final         Passed - Cl in normal range and within 180 days    Chloride  Date Value Ref Range Status  05/09/2024 109 98 - 111 mmol/L Final  04/02/2014 108 (H) 98 - 107 mmol/L Final         Passed - Valid encounter within last 6 months    Recent Outpatient Visits           3 months ago Chronic systolic congestive heart failure [facility])   [place] [facility] at [facility], Toribio [place]QU[place]   5 months ago Encounter for screening mammogram for breast cancer   [place] [facility] at [facility], Torib[place]QU[place]   5 months ago Type 2 diabetes mellitus with stage 5 chronic kidney disease not on chronic dialysis, with long-term current use of insulin  [facility])    Primary Care & Sports Medicine at [facility], Toribio SQUIBB, [place]               DULoxetine  (CYMBALTA ) 20 MG capsule 360 capsule 0    Sig: Take 2 capsules (40 mg total) by mouth 2 (two)  times daily.     Psychiatry: Antidepressants - SNRI - duloxetine  Failed - 05/09/2024 10:54 AM      Failed - Cr in normal range and within 360 days    Creatinine  Date Value Ref Range Status  04/24/2023 3.36 (H) 0.44 - 1.00 mg/dL Final  89/84/7984 9.24 0.60 - 1.30 mg/dL Final   Creatinine, Ser  Date Value Ref Range Status  05/09/2024 3.89 (H) 0.44 - 1.00 mg/dL Final   Creatinine, Urine  Date Value Ref Range Status  05/26/2023 96 mg/dL Final    Comment:    Performed at [facility], [address] [place], [place] [address]         Failed - eGFR is 30 or above and within 360 days    EGFR (African American)  Date Value Ref Range Status  04/02/2014 >60 >53mL/min Final  11/11/2012 53 (L)  Final   GFR calc Af Amer  Date Value Ref Range Status  08/10/2020 57 (L) >59 mL/min/1.73 Final    Comment:    **In accordance with recommendations from the NKF-ASN Task force,**   [facility] is in the process of updating its eGFR calculation to the   2021 CKD-EPI creatinine equation that estimates kidney function   without a race variable.    EGFR (Non-African Amer.)  Date Value Ref Range Status  04/02/2014 >60 >26mL/min Final    Comment:    eGFR values <44mL/min/1.73 m2 may be an indication of chronic kidney disease (CKD). Calculated eGFR, using the MRDR Study equation, is useful in  patients with stable renal function. The eGFR calculation will not be reliable in acutely ill patients when serum creatinine is changing rapidly. It is not useful in patients on dialysis. The eGFR calculation may not be applicable to patients at the low and high extremes of body sizes, pregnant women, and vegetarians.   11/11/2012 46 (L)  Final    Comment:    eGFR values <23mL/min/1.73 m2 may be an indication of chronic kidney disease (CKD). Calculated eGFR is useful in patients with stable renal function.  The eGFR calculation will not be reliable in acutely ill patients when serum creatinine  is changing rapidly. It is not useful in  patients on dialysis. The eGFR calculation may not be applicable to patients at the low and high extremes of body sizes, pregnant women, and vegetarians.    GFR, Estimated  Date Value Ref Range Status  05/09/2024 12 (L) >60 mL/min Final    Comment:    (NOTE) Calculated using the CKD-EPI Creatinine Equation (2021)   04/24/2023 15 (L) >60 mL/min Final    Comment:    (NOTE) Calculated using the CKD-EPI Creatinine Equation (2021)    eGFR  Date Value Ref Range Status  07/09/2023 10 (L) >59 mL/min/1.73 Final         Failed - Last BP in normal range    BP Readings from Last 1 Encounters:  05/09/24 (!) 158/65         Passed - Completed PHQ-2 or PHQ-9 in the last 360 days      Passed - Valid encounter within last 6 months    Recent Outpatient Visits           3 months ago Chronic systolic congestive heart failure [facility])   [place] [facility] at [facility], Toribio [place]QU[place]   5 months ago Encounter for screening mammogram for breast cancer   [place] [facility] at [facility], Toribio SQUIB[place]   5 months ago Type 2 diabetes mellitus with stage 5 chronic kidney disease not on chronic dialysis, with long-term current use of insulin  [facility])   [place] [facility] at [facility], Toribio SQUIBB, [place]               carvedilol  (COREG ) 25 MG tablet 180 tablet 3    Sig: Take 1 tablet (25 mg total) by mouth 2 (two) times daily.     Cardiovascular: Beta Blockers 3 Failed - 05/09/2024 10:54 AM      Failed - Cr in normal range and within 360 days    Creatinine  Date Value Ref Range Status  04/24/2023 3.36 (H) 0.44 - 1.00 mg/dL Final  89/84/7984 9.24 0.60 - 1.30 mg/dL Final   Creatinine, Ser  Date Value Ref Range Status  05/09/2024 3.89 (H) 0.44 - 1.00 mg/dL Final   Creatinine, Urine  Date Value Ref Range Status  05/26/2023 96 mg/dL Final     Comment:    Performed at [facility], [address] [place], [place] [address]         Failed - Last BP in normal range    BP Readings from Last 1 Encounters:  05/09/24 (!) 158/65         Passed - AST in normal range and within 360 days    AST  Date Value Ref Range Status  05/08/2024 19 15 - 41 U/L Final  04/24/2023 19 15 - 41 U/L Final         Passed - ALT in normal range and within 360 days    ALT  Date Value Ref Range Status  05/08/2024 16 0 - 44 U/L Final  04/24/2023 16 0 - 44 U/L Final   SGPT (ALT)  Date Value Ref Range Status  04/01/2014 19 U/L Final    Comment:    14-63 NOTE: New Reference Range 01/06/14          Passed - Last Heart Rate in normal  range    Pulse Readings from Last 1 Encounters:  05/09/24 79         Passed - Valid encounter within last 6 months    Recent Outpatient Visits           3 months ago Chronic systolic congestive heart failure [facility])   [place] [facility] at [facility], Toribio [place]QU[place]   5 months ago Encounter for screening mammogram for breast cancer   [place] [facility] at [facility], Toribio [place]QU[place]   5 months ago Type 2 diabetes mellitus with stage 5 chronic kidney disease not on chronic dialysis, with long-term current use of insulin  [facility])   [facility] Care & [facility] Medicine at [facility], Toribio SQUIBB, [place]               calcium  acetate (PHOSLO) 667 MG capsule      Sig: Take 1 capsule (667 mg total) by mouth.     Endocrinology:  Minerals - Calcium  Supplementation - calcium  acetate Failed - 05/09/2024 10:54 AM      Failed - Phosphate in normal range and within 360 days    Phosphorus  Date Value Ref Range Status  07/09/2023 5.5 (H) 3.0 - 4.3 mg/dL Final         Failed - PTH in normal range and within 360 days    No results found for: IOPTH, PTHINTACTFNA, PTH       Failed - Ca in normal range and within 360 days     Calcium   Date Value Ref Range Status  05/09/2024 8.1 (L) 8.9 - 10.3 mg/dL Final   Calcium , Total  Date Value Ref Range Status  04/02/2014 8.1 (L) 8.5 - 10.1 mg/dL Final         Passed - Valid encounter within last 12 months    Recent Outpatient Visits           3 months ago Chronic systolic congestive heart failure [facility])   [place] [facility] at [facility], Toribio [place]QUIB[place], [place]   5 months ago Encounter for screening mammogram for breast cancer   [place] [facility] at [facility], Toribio SQU[place]   5 months ago Type 2 diabetes mellitus with stage 5 chronic kidney disease not on chronic dialysis, with long-term current use of insulin  [facility])   [place] [facility] Medicine at  [facility], Toribio SQUIBB, [place]               isosorbide  dinitrate (ISORDIL ) 30 MG tablet 90 tablet 5    Sig: Take 1 tablet (30 mg total) by mouth 3 (three) times daily.     Cardiovascular:  Nitrates Failed - 05/09/2024 10:54 AM      Failed - Last BP in normal range    BP Readings from Last 1 Encounters:  05/09/24 (!) 158/65         Passed - Last Heart Rate in normal range    Pulse Readings from Last 1 Encounters:  05/09/24 79         Passed - Valid encounter within last 12 months    Recent Outpatient Visits           3 months ago Chronic systolic congestive heart failure [facility])   [facility] at [facility], Toribio SQUIBB, [place]   5  months ago Encounter for screening mammogram for breast cancer   [place] [facility] Medicine at [facility], Toribio SQUIBB, [place]   5 months ago Type 2 diabetes mellitus with stage 5 chronic kidney disease not on chronic dialysis, with long-term current use of insulin  [facility])   [place] [facility] Care [facility] Medicine at [facility], [place] P, [place]               HUMALOG  KWIKPEN 100 UNIT/ML KwikPen 15 mL 11     Sig: Inject 8 Units into the skin 3 (three) times daily after meals.     Endocrinology:  Diabetes - Insulins Passed - 05/09/2024 10:54 AM      Passed - HBA1C is between 0 and 7.9 and within 180 days    Hemoglobin A1C  Date Value Ref Range Status  06/09/2022 7.4  Final   Hgb A1c MFr Bld  Date Value Ref Range Status  02/01/2024 7.3 (H) 4.8 - 5.6 % Final    Comment:    (NOTE)         Prediabetes: 5.7 - 6.4         Diabetes: >6.4         Glycemic control for adults with diabetes: <7.0          Passed - Valid encounter within last 6 months    Recent Outpatient Visits           3 months ago Chronic systolic congestive heart failure [facility])   [place] [facility] at [facility], Toribio [place]QU[place]   5 months ago Encounter for screening mammogram for breast cancer   [place] [facility] at [facility], Toribio [place]QU[place]   5 months ago Type 2 diabetes mellitus with stage 5 chronic kidney disease not on chronic dialysis, with long-term current use of insulin  [facility])   [facility] Primary Care [facility] Medicine at [facility], Toribio SQUIBB, [place]               Glucagon 3 MG/DOSE POWD 1 each      Off-Protocol Failed - 05/09/2024 10:54 AM      Failed - Medication not assigned to a protocol, review manually.      Passed - Valid encounter within last 12 months    Recent Outpatient Visits           3 months ago Chronic systolic congestive heart failure [facility])   [place] [facility] at [facility], Toribio [place]QUIBB, [place]   5 months ago Encounter for screening mammogram for breast cancer    Primary Care [facility] Sports Medicine at [facility], Toribio [place]QU[place], [place]   5 months ago Type 2 diabetes mellitus with stage 5 chronic kidney disease not on chronic dialysis, with long-term current use of insulin  [facility])   [facility] at [facility], Toribio SQUIBB,  [place]

## 2024-05-09 NOTE — Telephone Encounter (Signed)
 Please call pt to schedule a appt. She needs a appt before refills can be sent in.  KP

## 2024-05-09 NOTE — Progress Notes (Signed)
 PHARMACY CONSULT NOTE - ELECTROLYTES  Pharmacy Consult for Electrolyte Monitoring and Replacement   Recent Labs: Height: 5' 5 (165.1 cm) Weight: 56.5 kg (124 lb 9 oz) IBW/kg (Calculated) : 57 Estimated Creatinine Clearance: 12.7 mL/min (A) (by C-G formula based on SCr of 3.89 mg/dL (H)). Potassium (mmol/L)  Date Value  05/09/2024 3.4 (L)  04/02/2014 3.8   Magnesium  (mg/dL)  Date Value  96/90/7976 2.3   Calcium  (mg/dL)  Date Value  88/78/7974 8.1 (L)   Calcium , Total (mg/dL)  Date Value  89/84/7984 8.1 (L)   Albumin (g/dL)  Date Value  88/79/7974 2.9 (L)  07/09/2023 3.1 (L)  04/01/2014 3.3 (L)   Phosphorus (mg/dL)  Date Value  98/79/7974 5.5 (H)   Sodium (mmol/L)  Date Value  05/09/2024 140  07/09/2023 135  04/02/2014 141   Corrected Ca: 9 mg/dL  Assessment  [name] [name] is a 68 y.o. female presenting with worsening LE edema. PMH significant for lung cancer, CKD5, DM, HTN, PAD s/p R BKA, chronic systolic Chf. Pharmacy has been consulted to monitor and replace electrolytes.  Diet: PO MIVF: N/A Pertinent medications: furosemide   Goal of Therapy: Electrolytes WNL  Plan:  K 3.4, will give KCL 40 mEq PO x 1 No updated Mag or Phos levels Check BMP, Mg, Phos with AM labs  Thank you for allowing pharmacy to be a part of this patient's care.  Lum VEAR Mania, PharmD, BCPS Clinical Pharmacist 05/09/2024 4:16 PM

## 2024-05-09 NOTE — Plan of Care (Signed)

## 2024-05-09 NOTE — Evaluation (Signed)
 Occupational Therapy Evaluation Patient Details Name: [name] [name] MRN: [id number] DOB: [birth year] Today's Date: 05/09/2024   History of Present Illness   Patient is a 68 year old female with LE edema, weight gain. Found to have acutely decompensated diastolic CHF exacerbation. PMH: lung cancer, CKD5, DM, HTN, PAD s/p R BKA, chronic systolic CHF     Clinical Impressions Chart reviewed to date, pt greeted semi supine in bed with PT present, agreeable to OT evaluation. PTA pt reports she amb with prosthetic leg, PRN use of RW. When she does not have leg donned, she uses her mwc. She is grossly MOD I for ADLs. Pt presents with deficits in strength, endurance, activity tolerance, balance, affecting safe and optimal ADL completion. Pt is weaker than PLOF, requires MOD-MAX A for functional tranfers. She will benefit from acute OT to address functional deficits and to facilitate optimal ADL/functional mobility performance.      If plan is discharge home, recommend the following:   A lot of help with walking and/or transfers;A little help with bathing/dressing/bathroom;Assist for transportation;Help with stairs or ramp for entrance;Assistance with cooking/housework     Functional Status Assessment   Patient has had a recent decline in their functional status and demonstrates the ability to make significant improvements in function in a reasonable and predictable amount of time.     Equipment Recommendations   Other (comment) (defer to next venue of care)     Recommendations for Other Services         Precautions/Restrictions   Precautions Precautions: Fall Recall of Precautions/Restrictions: Impaired Precaution/Restrictions Comments: old R BKA Restrictions Weight Bearing Restrictions Per Provider Order: No     Mobility Bed Mobility Overal bed mobility: Needs Assistance Bed Mobility: Supine to Sit     Supine to sit: Supervision, Used rails, HOB elevated           Transfers Overall transfer level: Needs assistance Equipment used: Rolling Bunyard (2 wheels) Transfers: Bed to chair/wheelchair/BSC, Sit to/from Stand Sit to Stand: Mod assist   Squat pivot transfers: Max assist              Balance Overall balance assessment: Needs assistance Sitting-balance support: Single extremity supported Sitting balance-Leahy Scale: Good     Standing balance support: Bilateral upper extremity supported, During functional activity, Reliant on assistive device for balance Standing balance-Leahy Scale: Poor                             ADL either performed or assessed with clinical judgement   ADL Overall ADL's : Needs assistance/impaired Eating/Feeding: Set up   Grooming: Set up;Sitting               Lower Body Dressing: Contact guard assist;Sitting/lateral leans Lower Body Dressing Details (indicate cue type and reason): donn sock Toilet Transfer: Moderate assistance;Maximal assistance;Rolling Leyva (2 wheels) Toilet Transfer Details (indicate cue type and reason): stand, but ultimately to squat pivot                 Vision Patient Visual Report: No change from baseline       Perception         Praxis         Pertinent Vitals/Pain Pain Assessment Pain Assessment: No/denies pain     Extremity/Trunk Assessment Upper Extremity Assessment Upper Extremity Assessment: Overall WFL for tasks assessed;Generalized weakness (grossly 4-/5 throughout)   Lower Extremity Assessment Lower Extremity Assessment: Generalized weakness  Communication Communication Communication: No apparent difficulties   Cognition Arousal: Alert Behavior During Therapy: WFL for tasks assessed/performed Cognition: No apparent impairments                               Following commands: Intact       Cueing  General Comments   Cueing Techniques: Verbal cues  HR 70s bpm post mobility   Exercises Other  Exercises Other Exercises: edu re role of OT, role of rehab, discharge recommendations   Shoulder Instructions      Home Living Family/patient expects to be discharged to:: Private residence Living Arrangements: Other relatives Available Help at Discharge:  (unsure) Type of Home: House                       Home Equipment: Agricultural Consultant (2 wheels);Shower seat;Wheelchair - manual          Prior Functioning/Environment Prior Level of Function : Independent/Modified Independent             Mobility Comments: amb with prosthesis with/without RW; uses mwc when she does not have the protshesis ADLs Comments: MOD I with ADL/IADL    OT Problem List: Decreased activity tolerance;Decreased knowledge of use of DME or AE;Decreased strength;Impaired balance (sitting and/or standing)   OT Treatment/Interventions: Self-care/ADL training;Therapeutic exercise;Balance training;DME and/or AE instruction;Energy conservation;Patient/family education      OT Goals(Current goals can be found in the care plan section)   Acute Rehab OT Goals Patient Stated Goal: rehab OT Goal Formulation: With patient Time For Goal Achievement: 05/23/24 Potential to Achieve Goals: Good   OT Frequency:  Min 2X/week    Co-evaluation PT/OT/SLP Co-Evaluation/Treatment: Yes Reason for Co-Treatment: To address functional/ADL transfers (overlap for transfers, endurance)   OT goals addressed during session: ADL's and self-care      AM-PAC OT 6 Clicks Daily Activity     Outcome Measure Help from another person eating meals?: None Help from another person taking care of personal grooming?: None Help from another person toileting, which includes using toliet, bedpan, or urinal?: A Lot Help from another person bathing (including washing, rinsing, drying)?: A Lot Help from another person to put on and taking off regular upper body clothing?: A Little Help from another person to put on and taking off  regular lower body clothing?: A Lot 6 Click Score: 17   End of Session Equipment Utilized During Treatment: Rolling Strickling (2 wheels) Nurse Communication: Mobility status  Activity Tolerance: Patient tolerated treatment well Patient left: in chair;with call bell/phone within reach;with chair alarm set  OT Visit Diagnosis: Other abnormalities of gait and mobility (R26.89)                Time: 9072-9057 OT Time Calculation (min): 15 min Charges:  OT General Charges $OT Visit: 1 Visit OT Evaluation $OT Eval Moderate Complexity: 1 Mod  Therisa Sheffield, OTD OTR/L  05/09/24, 1:58 PM

## 2024-05-09 NOTE — TOC Progression Note (Signed)
 Transition of Care [facility]) - Progression Note    Patient Details  Name: [name] MRN: [id number] Date of Birth: 06/10/1956  Transition of Care [facility]) CM/SW Contact  Corean ONEIDA Haddock, RN Phone Number: 05/09/2024, 4:15 PM  Clinical Narrative:      Patient transferred from 2a Therapy recs for SNF Per handoff patient interested in placement Existing PASRR Fl2 sent for signature Bed search initiated   Expected Discharge Plan: Home/Self Care Barriers to Discharge: Continued Medical Work up               Expected Discharge Plan and Services     Post Acute Care Choice: NA Living arrangements for the past 2 months: Single Family Home                                       Social Drivers of Health (SDOH) Interventions SDOH Screenings   Food Insecurity: No Food Insecurity (05/08/2024)  Housing: Low Risk  (05/08/2024)  Transportation Needs: No Transportation Needs (05/08/2024)  Utilities: Not At Risk (05/08/2024)  Depression (PHQ2-9): High Risk (02/06/2024)  Financial Resource Strain: Low Risk  (06/29/2023)   Received from [facility]  Social Connections: Moderately Integrated (05/08/2024)  Stress: Stress Concern Present (12/15/2021)  Tobacco Use: Medium Risk (05/07/2024)    Readmission Risk Interventions    05/08/2024    2:20 PM  Readmission Risk Prevention Plan  Transportation Screening Complete  Medication Review (RN Care Manager) Referral to Pharmacy  PCP or Specialist appointment within 3-5 days of discharge Complete  SW Recovery Care/Counseling Consult Complete  Palliative Care Screening Not Applicable  Skilled Nursing Facility Not Applicable

## 2024-05-09 NOTE — Progress Notes (Addendum)
 Cardiology Progress Note   Patient Name: [name] [name] Date of Encounter: 05/09/2024  Primary Cardiologist: KYM Ruth - [facility]  Subjective   Feels much better this morning with significantly less swelling.  Weight still above prior baseline.  Denies dyspnea. Objective   Inpatient Medications    Scheduled Meds:  atorvastatin   80 mg Oral Daily   carvedilol   25 mg Oral BID WC   ezetimibe   10 mg Oral Daily   furosemide   80 mg Intravenous BID   heparin  injection (subcutaneous)  5,000 Units Subcutaneous Q8H   hydrALAZINE   100 mg Oral Q8H   insulin  aspart  0-5 Units Subcutaneous QHS   insulin  aspart  0-6 Units Subcutaneous TID WC   insulin  aspart  4 Units Subcutaneous TID WC   insulin  glargine-yfgn  4 Units Subcutaneous QHS   isosorbide  dinitrate  30 mg Oral TID   Continuous Infusions:  PRN Meds: acetaminophen  **OR** acetaminophen    Vital Signs    Vitals:   05/09/24 0006 05/09/24 0341 05/09/24 0434 05/09/24 0733  BP: (!) 163/69 (!) 149/54  (!) 158/65  Pulse: 74 73  79  Resp: 17 17    Temp: 98 F (36.7 C) 98 F (36.7 C)  98.5 F (36.9 C)  TempSrc:      SpO2: 98% 100%  100%  Weight:   56.5 kg   Height:        Intake/Output Summary (Last 24 hours) at 05/09/2024 1100 Last data filed at 05/09/2024 0959 Gross per 24 hour  Intake 530 ml  Output 2250 ml  Net -1720 ml   Filed Weights   05/07/24 2002 05/08/24 0432 05/09/24 0434  Weight: 56.6 kg 56.6 kg 56.5 kg    Physical Exam   GEN: Well nourished, well developed, in no acute distress.  HEENT: Grossly normal.  Neck: Supple, no JVD, bilateral carotid bruits versus radiated murmur.  No masses.   Cardiac: RRR, 2/6 systolic murmur at the upper sternal borders.  No rubs or gallops. No clubbing, cyanosis, edema.  Right BKA. Respiratory:  Respirations regular and unlabored, diminished breath sounds at the left base.  Otherwise clear to auscultation. GI: Soft, nontender, nondistended, BS + x 4. MS: no deformity or  atrophy. Skin: warm and dry, no rash. Neuro:  Strength and sensation are intact. Psych: AAOx3.  Normal affect.  Labs    Chemistry Recent Labs  Lab 05/07/24 1149 05/07/24 1536 05/08/24 0046 05/09/24 0330  NA 142  --  142 140  K 3.4*  --  3.5 3.4*  CL 112*  --  113* 109  CO2 17*  --  18* 20*  GLUCOSE 214*  --  220* 170*  BUN 60*  --  61* 60*  CREATININE 4.23*  --  4.00* 3.89*  CALCIUM  8.3*  --  8.0* 8.1*  PROT  --  5.2* 5.0*  --   ALBUMIN  --  3.0* 2.9*  --   AST  --  24 19  --   ALT  --  17 16  --   ALKPHOS  --  118 112  --   BILITOT  --  <0.2 0.2  --   GFRNONAA 11*  --  12* 12*  ANIONGAP 14  --  11 10     Hematology Recent Labs  Lab 05/07/24 1149 05/08/24 0046 05/09/24 0330  WBC 5.2 5.4 6.3  RBC 2.17* 2.61* 2.61*  HGB 6.8* 8.0* 8.1*  HCT 22.3* 25.8* 25.6*  MCV 102.8* 98.9 98.1  MCH 31.3 30.7 31.0  MCHC 30.5 31.0 31.6  RDW 19.0* 18.0* 18.3*  PLT 228 200 215    BNP    Component Value Date/Time   BNP 3,838.7 (H) 01/29/2024 1422    ProBNP    Component Value Date/Time   PROBNP >35,000.0 (H) 05/07/2024 1536    Lipids  Lab Results  Component Value Date   CHOL 268 (H) 05/26/2023   HDL 63 05/26/2023   LDLCALC 175 (H) 05/26/2023   TRIG 151 (H) 05/26/2023   CHOLHDL 4.3 05/26/2023    HbA1c  Lab Results  Component Value Date   HGBA1C 7.3 (H) 02/01/2024    Radiology    DG Chest Port 1 View Result Date: 05/07/2024 EXAM: 1 VIEW(S) XRAY OF THE CHEST 05/07/2024 03:32:00 PM COMPARISON: 01/29/2024 CLINICAL HISTORY: SOB (shortness of breath) FINDINGS: LINES, TUBES AND DEVICES: Stable right internal jugular Port-A-Cath. LUNGS AND PLEURA: Elevated left hemidiaphragm. Mild left basilar atelectasis or scarring is noted. Minimal left pleural effusion. No pneumothorax. HEART AND MEDIASTINUM: No acute abnormality of the cardiac and mediastinal silhouettes. BONES AND SOFT TISSUES: No acute osseous abnormality. IMPRESSION: 1. Mild left basilar atelectasis or  scarring with minimal left pleural effusion. 2. Elevated left hemidiaphragm. Electronically signed by: Lynwood Seip MD 05/07/2024 03:46 PM EST RP Workstation: [id number]     Telemetry    RSR - Personally Reviewed  Cardiac Studies   2D Echocardiogram 7.2025  Summary   1. The left ventricle is normal in size with mildly increased wall  thickness.   2. The left ventricular systolic function is borderline, LVEF is visually  estimated at 50-55%.    3. There is grade II diastolic dysfunction (elevated filling pressure).    4. The right ventricle is normal in size, with normal systolic function.    5. There is mild mitral valve regurgitation.    Patient Profile     68 y.o. 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  Toxicology screen positive for cocaine.  Assessment & Plan    1.  Acute on chronic heart failure with improved ejection fraction: EF previously as low as 20 to 25% with improvement to 50-55% by echo in July 2025 performed at [facility].  Patient ran out of torsemide  for at least 1 week prior to admission with resultant lower extremity swelling and dyspnea.  BNP greater than 35,000 on presentation.  She has responded well to intravenous Lasix  with resolution of lower extremity swelling and improvement in breathing.  No JVD on examination today.  -1.7 L overnight and 1.4 L since admission.  Weight still listed approximately 7-1/2 kg above prior dry weight.  BUN and creatinine improved following diuresis with a BUN of 60 and creatinine of 3.89.  Will continue IV diuretic therapy today with plan to transition back to home dose of torsemide   tomorrow (80 mg daily).  Continue beta-blocker, hydralazine , and nitrate therapy.  May need to reconsider beta-blocker usage in the setting of ongoing cocaine use.  No ACE/ARB/ARNI/SGLT2 inhibitor in the setting of renal failure.  She follows at [facility].  2.  Stage IV chronic kidney disease: Baseline creatinine approximately 3.5 with presenting creatinine of 4.23.  As above, BUN and creatinine slightly  improved at 60/3.9 respectively following IV diuresis.  Patient is followed by nephrology as an outpatient and already has an AV fistula.  She has not yet started dialysis.  Diuretic therapy as outlined above with plan to transition to p.o. tomorrow provide at that volume and renal function are stable.  No ACE/ARB/SGLT2 inhibitor in the setting of advanced kidney disease.  3.  Primary hypertension: Pressures trending elevated.  150s this morning.  Continue beta-blocker, hydralazine , nitrate.  Continue to follow with diuresis.  Could consider titration of nitrate versus addition of amlodipine if pressures remain elevated despite adequate diuresis.  4.  Coronary artery disease: Known chronic total occlusion of the right coronary artery.  Denies chest pain.  She is on beta-blocker, statin, Zetia , and long-acting nitrate.  On aspirin  at home.  Was held in the setting of anemia on admission.  Look to resume prior to discharge.  5.  Hyperlipidemia: On statin and Zetia  with an LDL of 53 through labs at [facility] earlier this month.  LFTs are normal.  6.  Macrocytic anemia: H&H of 6.8/22.3 on admission.  S/p 1 unit packed red blood cells with improvement in hemoglobin 8.1.  Currently asymptomatic.  Suspect anemia of chronic disease in the setting of renal failure.  Plan to resume aspirin  at discharge.  7.  Cocaine use: Toxicology screen positive for cocaine.  Discussed increased risk of recurrent systolic heart failure in the setting of ongoing substance abuse.  May need to reconsider beta-blocker usage going forward.  8.   Carotid bruits/carotid arterial disease: Patient with carotid bruits versus radiated murmur from aortic area.  Echo in July did not show any significant aortic valve disease.  Reviewing records from [facility] from last year, CTA of the head and neck did show moderate bilateral common carotid disease-50% on the right and less than 50% on the left.  There was also ulcerated plaque versus chronic dissection involving the mid right common carotid artery and moderate bilateral internal carotid artery plaque.  Continue statin and Zetia .  Resume aspirin  provide H&H remained stable.  Signed, Lonni Meager, NP  05/09/2024, 11:00 AM    For questions or updates, please contact   Please consult [email or web address] for contact info under Cardiology/STEMI.

## 2024-05-09 NOTE — Telephone Encounter (Signed)
 Please review.  KP

## 2024-05-09 NOTE — Progress Notes (Signed)
 Heart Failure Navigator Progress Note  Advanced Heart Failure Team patient of Ellouise Class, FNP.  Patient has No Showed at the [facility] for her last 3 scheduled appointments. Navigator will sign off at this time.  Charmaine Pines, RN, BSN [facility] Heart Failure Navigator Secure Chat Only

## 2024-05-09 NOTE — Evaluation (Signed)
 Physical Therapy Evaluation Patient Details Name: [name] [name] MRN: [id number] DOB: [birth year] Today's Date: 05/09/2024  History of Present Illness  Patient is a 68 year old female with LE edema, weight gain. Found to have acutely decompensated diastolic CHF exacerbation. PMH: lung cancer, CKD5, DM, HTN, PAD s/p R BKA, chronic systolic CHF  Clinical Impression  Patient is agreeable to PT evaluation. She is Mod I at baseline, ambulatory with rolling Stanish using her prosthesis. She does not require assistance for ADLs at baseline.  Today the patient has increased generalized weakness compared to her baseline. She moves well in the bed but required Max A- Mod A for transfers using rolling Stuckey. Unable to come to full standing position with flexed posture secondary to fatigue and generalized weakness. Prosthesis not in the room, but unlikely to be able to walk at this time due to weakness. She is not at her baseline and is interested in going to a facility after this hospital stay. Rehabilitation < 3 hours/day recommended. PT will continue to follow.       If plan is discharge home, recommend the following: A lot of help with walking and/or transfers;A little help with bathing/dressing/bathroom;Assist for transportation;Help with stairs or ramp for entrance   Can travel by private vehicle   No    Equipment Recommendations None recommended by PT  Recommendations for Other Services       Functional Status Assessment Patient has had a recent decline in their functional status and demonstrates the ability to make significant improvements in function in a reasonable and predictable amount of time.     Precautions / Restrictions Precautions Precautions: Fall Recall of Precautions/Restrictions: Impaired Restrictions Weight Bearing Restrictions Per Provider Order: No      Mobility  Bed Mobility Overal bed mobility: Needs Assistance Bed Mobility: Supine to Sit     Supine to sit:  Supervision, Used rails          Transfers Overall transfer level: Needs assistance Equipment used: Rolling Dolinsky (2 wheels) Transfers: Bed to chair/wheelchair/BSC, Sit to/from Stand Sit to Stand: Mod assist     Squat pivot transfers: Max assist     General transfer comment: cues for safety and technique. significant declined in function from baseline independence. she reports feeling generalized weakness without dizziness    Ambulation/Gait               General Gait Details: not attempted due to decreased activity tolerance, generalized weakness, prosthesis not here  Stairs            Wheelchair Mobility     Tilt Bed    Modified Rankin (Stroke Patients Only)       Balance Overall balance assessment: Needs assistance Sitting-balance support: Feet supported Sitting balance-Leahy Scale: Good     Standing balance support: Bilateral upper extremity supported Standing balance-Leahy Scale: Poor Standing balance comment: external support required and unable to achieve full upright standing posture with trunk flexed due to weakness                             Pertinent Vitals/Pain Pain Assessment Pain Assessment: No/denies pain    Home Living Family/patient expects to be discharged to:: Private residence Living Arrangements: Other relatives Available Help at Discharge:  (unsure) Type of Home: House           Home Equipment: Agricultural Consultant (2 wheels);Shower seat;Wheelchair - manual      Prior Function Prior  Level of Function : Independent/Modified Independent             Mobility Comments: ambulatory with rolling Haithcock with prosthesis. does not typically walk without the prosthesis and sometimes uses a wheechair to get to the bathroom ADLs Comments: Mod I     Extremity/Trunk Assessment   Upper Extremity Assessment Upper Extremity Assessment: Defer to OT evaluation    Lower Extremity Assessment Lower Extremity  Assessment: Generalized weakness (ROM right knee grossly WFL)       Communication   Communication Communication: No apparent difficulties    Cognition Arousal: Alert Behavior During Therapy: WFL for tasks assessed/performed   PT - Cognitive impairments: No apparent impairments                         Following commands: Intact       Cueing Cueing Techniques: Verbal cues     General Comments      Exercises     Assessment/Plan    PT Assessment Patient needs continued PT services  PT Problem List Decreased strength;Decreased activity tolerance;Decreased balance;Decreased mobility       PT Treatment Interventions DME instruction;Gait training;Stair training;Functional mobility training;Therapeutic activities;Therapeutic exercise;Balance training;Neuromuscular re-education;Wheelchair mobility training    PT Goals (Current goals can be found in the Care Plan section)  Acute Rehab PT Goals Patient Stated Goal: to go to a facility PT Goal Formulation: With patient Time For Goal Achievement: 05/23/24 Potential to Achieve Goals: Good    Frequency Min 2X/week     Co-evaluation PT/OT/SLP Co-Evaluation/Treatment: Yes Reason for Co-Treatment: To address functional/ADL transfers (partial overalap, low endurance) PT goals addressed during session: Mobility/safety with mobility         AM-PAC PT 6 Clicks Mobility  Outcome Measure Help needed turning from your back to your side while in a flat bed without using bedrails?: None Help needed moving from lying on your back to sitting on the side of a flat bed without using bedrails?: A Little Help needed moving to and from a bed to a chair (including a wheelchair)?: A Lot Help needed standing up from a chair using your arms (e.g., wheelchair or bedside chair)?: A Lot Help needed to walk in hospital room?: A Lot Help needed climbing 3-5 steps with a railing? : Total 6 Click Score: 14    End of Session    Activity Tolerance: Patient tolerated treatment well Patient left: in chair;with call bell/phone within reach;with chair alarm set Nurse Communication: Mobility status PT Visit Diagnosis: Muscle weakness (generalized) (M62.81);Unsteadiness on feet (R26.81)    Time: 9078-9057 PT Time Calculation (min) (ACUTE ONLY): 21 min   Charges:   PT Evaluation $PT Eval Low Complexity: 1 Low   PT General Charges $$ ACUTE PT VISIT: 1 Visit         Randine Essex, PT, MPT   Randine LULLA Essex 05/09/2024, 9:56 AM

## 2024-05-09 NOTE — Plan of Care (Signed)

## 2024-05-09 NOTE — Telephone Encounter (Signed)
 Patient will call to schedule once she figures out transportation.

## 2024-05-10 DIAGNOSIS — Z7189 Other specified counseling: Secondary | ICD-10-CM

## 2024-05-10 DIAGNOSIS — I509 Heart failure, unspecified: Secondary | ICD-10-CM | POA: Diagnosis not present

## 2024-05-10 DIAGNOSIS — I739 Peripheral vascular disease, unspecified: Secondary | ICD-10-CM | POA: Diagnosis not present

## 2024-05-10 DIAGNOSIS — Z515 Encounter for palliative care: Secondary | ICD-10-CM

## 2024-05-10 DIAGNOSIS — N184 Chronic kidney disease, stage 4 (severe): Secondary | ICD-10-CM | POA: Diagnosis not present

## 2024-05-10 DIAGNOSIS — I5032 Chronic diastolic (congestive) heart failure: Secondary | ICD-10-CM | POA: Diagnosis not present

## 2024-05-10 DIAGNOSIS — I25118 Atherosclerotic heart disease of native coronary artery with other forms of angina pectoris: Secondary | ICD-10-CM | POA: Diagnosis not present

## 2024-05-10 DIAGNOSIS — D649 Anemia, unspecified: Secondary | ICD-10-CM | POA: Diagnosis not present

## 2024-05-10 DIAGNOSIS — E782 Mixed hyperlipidemia: Secondary | ICD-10-CM | POA: Diagnosis not present

## 2024-05-10 DIAGNOSIS — I132 Hypertensive heart and chronic kidney disease with heart failure and with stage 5 chronic kidney disease, or end stage renal disease: Secondary | ICD-10-CM | POA: Diagnosis not present

## 2024-05-10 DIAGNOSIS — I5033 Acute on chronic diastolic (congestive) heart failure: Secondary | ICD-10-CM | POA: Diagnosis not present

## 2024-05-10 LAB — BASIC METABOLIC PANEL WITH GFR
Anion gap: 11 (ref 5–15)
BUN: 62 mg/dL — ABNORMAL HIGH (ref 8–23)
CO2: 24 mmol/L (ref 22–32)
Calcium: 8 mg/dL — ABNORMAL LOW (ref 8.9–10.3)
Chloride: 106 mmol/L (ref 98–111)
Creatinine, Ser: 3.94 mg/dL — ABNORMAL HIGH (ref 0.44–1.00)
GFR, Estimated: 12 mL/min — ABNORMAL LOW (ref >60)
Glucose, Bld: 188 mg/dL — ABNORMAL HIGH (ref 70–99)
Potassium: 4.6 mmol/L (ref 3.5–5.1)
Sodium: 140 mmol/L (ref 135–145)

## 2024-05-10 LAB — RENAL FUNCTION PANEL
Albumin: 2.7 g/dL — ABNORMAL LOW (ref 3.5–5.0)
Anion gap: 10 (ref 5–15)
BUN: 62 mg/dL — ABNORMAL HIGH (ref 8–23)
CO2: 23 mmol/L (ref 22–32)
Calcium: 8.1 mg/dL — ABNORMAL LOW (ref 8.9–10.3)
Chloride: 106 mmol/L (ref 98–111)
Creatinine, Ser: 3.96 mg/dL — ABNORMAL HIGH (ref 0.44–1.00)
GFR, Estimated: 12 mL/min — ABNORMAL LOW (ref >60)
Glucose, Bld: 188 mg/dL — ABNORMAL HIGH (ref 70–99)
Phosphorus: 2.6 mg/dL (ref 2.5–4.6)
Potassium: 4.5 mmol/L (ref 3.5–5.1)
Sodium: 139 mmol/L (ref 135–145)

## 2024-05-10 LAB — MAGNESIUM: Magnesium: 2.2 mg/dL (ref 1.7–2.4)

## 2024-05-10 LAB — CBC
HCT: 25.4 % — ABNORMAL LOW (ref 36.0–46.0)
Hemoglobin: 7.9 g/dL — ABNORMAL LOW (ref 12.0–15.0)
MCH: 30.7 pg (ref 26.0–34.0)
MCHC: 31.1 g/dL (ref 30.0–36.0)
MCV: 98.8 fL (ref 80.0–100.0)
Platelets: 210 K/uL (ref 150–400)
RBC: 2.57 MIL/uL — ABNORMAL LOW (ref 3.87–5.11)
RDW: 18 % — ABNORMAL HIGH (ref 11.5–15.5)
WBC: 5.2 K/uL (ref 4.0–10.5)
nRBC: 0 % (ref 0.0–0.2)

## 2024-05-10 LAB — GLUCOSE, CAPILLARY
Glucose-Capillary: 207 mg/dL — ABNORMAL HIGH (ref 70–99)
Glucose-Capillary: 200 mg/dL — ABNORMAL HIGH (ref 70–99)
Glucose-Capillary: 214 mg/dL — ABNORMAL HIGH (ref 70–99)
Glucose-Capillary: 177 mg/dL — ABNORMAL HIGH (ref 70–99)

## 2024-05-10 MED ORDER — ISOSORBIDE DINITRATE 20 MG PO TABS
40.0000 mg | ORAL_TABLET | Freq: Three times a day (TID) | ORAL | Status: DC
  Administered 2024-05-10 – 2024-05-13 (×10): 40 mg via ORAL
  Filled 2024-05-10 (×11): qty 2

## 2024-05-10 MED ORDER — TORSEMIDE 20 MG PO TABS
80.0000 mg | ORAL_TABLET | Freq: Every day | ORAL | Status: DC
  Administered 2024-05-10 – 2024-05-13 (×4): 80 mg via ORAL
  Filled 2024-05-10 (×4): qty 4

## 2024-05-10 MED ORDER — METOLAZONE 2.5 MG PO TABS
2.5000 mg | ORAL_TABLET | ORAL | Status: DC
  Administered 2024-05-11: 2.5 mg via ORAL
  Filled 2024-05-10: qty 1

## 2024-05-10 MED ORDER — EPOETIN ALFA-EPBX 4000 UNIT/ML IJ SOLN
4000.0000 [IU] | Freq: Once | INTRAMUSCULAR | Status: AC
  Administered 2024-05-10: 4000 [IU] via SUBCUTANEOUS
  Filled 2024-05-10: qty 1

## 2024-05-10 MED ORDER — EPOETIN ALFA-EPBX 10000 UNIT/ML IJ SOLN
4000.0000 [IU] | Freq: Once | INTRAMUSCULAR | Status: DC
  Filled 2024-05-10 (×2): qty 2

## 2024-05-10 NOTE — Plan of Care (Signed)

## 2024-05-10 NOTE — Progress Notes (Signed)
 Cardiology Progress Note   Patient Name: [name] [name] Date of Encounter: 05/10/2024  Primary Cardiologist: KYM Ruth [facility]  Subjective   Feels well this morning without chest pain, dyspnea, or edema.  Hoping to go home. Objective   Inpatient Medications    Scheduled Meds:  aspirin   81 mg Oral Daily   atorvastatin   80 mg Oral Daily   carvedilol   25 mg Oral BID WC   DULoxetine   40 mg Oral BID   ezetimibe   10 mg Oral Daily   heparin  injection (subcutaneous)  5,000 Units Subcutaneous Q8H   hydrALAZINE   100 mg Oral Q8H   insulin  aspart  0-5 Units Subcutaneous QHS   insulin  aspart  0-6 Units Subcutaneous TID WC   insulin  aspart  4 Units Subcutaneous TID WC   insulin  glargine-yfgn  4 Units Subcutaneous QHS   isosorbide  dinitrate  30 mg Oral TID   [START ON 05/11/2024] metolazone   2.5 mg Oral Weekly   torsemide   80 mg Oral Daily   Continuous Infusions:  PRN Meds: acetaminophen  **OR** acetaminophen    Vital Signs    Vitals:   05/10/24 0352 05/10/24 0533 05/10/24 0545 05/10/24 0814  BP: 138/60  (!) 147/66 (!) 171/73  Pulse: 66   72  Resp: 17   16  Temp: 98.6 F (37 C)   98.6 F (37 C)  TempSrc:      SpO2: 99%   100%  Weight:  55.2 kg    Height:        Intake/Output Summary (Last 24 hours) at 05/10/2024 1139 Last data filed at 05/10/2024 0814 Gross per 24 hour  Intake 300 ml  Output 3100 ml  Net -2800 ml   Filed Weights   05/08/24 0432 05/09/24 0434 05/10/24 0533  Weight: 56.6 kg 56.5 kg 55.2 kg    Physical Exam   GEN: Well nourished, well developed, in no acute distress.  HEENT: Grossly normal.  Neck: Supple, no JVD.  Bilateral carotid bruits.  No masses. Cardiac: RRR, 2/6 systolic murmur at the upper sternal borders.  No clubbing, cyanosis, edema.  Right BKA. Respiratory:  Respirations regular and unlabored, clear to auscultation bilaterally. GI: Soft, nontender, nondistended, BS + x 4. MS: no deformity or atrophy. Skin: warm and dry, no rash. Neuro:   Strength and sensation are intact. Psych: AAOx3.  Normal affect.  Labs    Chemistry Recent Labs  Lab 05/07/24 1536 05/08/24 0046 05/09/24 0330 05/10/24 0346  NA  --  142 140 139  140  K  --  3.5 3.4* 4.5  4.6  CL  --  113* 109 106  106  CO2  --  18* 20* 23  24  GLUCOSE  --  220* 170* 188*  188*  BUN  --  61* 60* 62*  62*  CREATININE  --  4.00* 3.89* 3.96*  3.94*  CALCIUM   --  8.0* 8.1* 8.1*  8.0*  PROT 5.2* 5.0*  --   --   ALBUMIN 3.0* 2.9*  --  2.7*  AST 24 19  --   --   ALT 17 16  --   --   ALKPHOS 118 112  --   --   BILITOT <0.2 0.2  --   --   GFRNONAA  --  12* 12* 12*  12*  ANIONGAP  --  11 10 10  11      Hematology Recent Labs  Lab 05/08/24 0046 05/09/24 0330 05/10/24 0346  WBC 5.4 6.3  5.2  RBC 2.61* 2.61* 2.57*  HGB 8.0* 8.1* 7.9*  HCT 25.8* 25.6* 25.4*  MCV 98.9 98.1 98.8  MCH 30.7 31.0 30.7  MCHC 31.0 31.6 31.1  RDW 18.0* 18.3* 18.0*  PLT 200 215 210   BNP    Component Value Date/Time   BNP 3,838.7 (H) 01/29/2024 1422    ProBNP    Component Value Date/Time   PROBNP >35,000.0 (H) 05/07/2024 1536   Lipids  Lab Results  Component Value Date   CHOL 268 (H) 05/26/2023   HDL 63 05/26/2023   LDLCALC 175 (H) 05/26/2023   TRIG 151 (H) 05/26/2023   CHOLHDL 4.3 05/26/2023    HbA1c  Lab Results  Component Value Date   HGBA1C 7.3 (H) 02/01/2024    Radiology    DG Chest Port 1 View Result Date: 05/07/2024 EXAM: 1 VIEW(S) XRAY OF THE CHEST 05/07/2024 03:32:00 PM COMPARISON: 01/29/2024 CLINICAL HISTORY: SOB (shortness of breath) FINDINGS: LINES, TUBES AND DEVICES: Stable right internal jugular Port-A-Cath. LUNGS AND PLEURA: Elevated left hemidiaphragm. Mild left basilar atelectasis or scarring is noted. Minimal left pleural effusion. No pneumothorax. HEART AND MEDIASTINUM: No acute abnormality of the cardiac and mediastinal silhouettes. BONES AND SOFT TISSUES: No acute osseous abnormality. IMPRESSION: 1. Mild left basilar atelectasis or  scarring with minimal left pleural effusion. 2. Elevated left hemidiaphragm. Electronically signed by: Lynwood Seip MD 05/07/2024 03:46 PM EST RP Workstation: [id number]     Telemetry    Non-tele  Cardiac Studies   2D Echocardiogram 7.2025   Summary   1. The left ventricle is normal in size with mildly increased wall  thickness.   2. The left ventricular systolic function is borderline, LVEF is visually  estimated at 50-55%.    3. There is grade II diastolic dysfunction (elevated filling pressure).    4. The right ventricle is normal in size, with normal systolic function.    5. There is mild mitral valve regurgitation.   Patient Profile     68 y.o. 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  Toxicology screen positive for cocaine.   Assessment & Plan    1. Acute on chronic heart failure with improved ejection fraction: EF previously as low as 20 to 25% with improvement to 50-55% by echo in July 2025 performed at [facility]. Patient ran out of torsemide  for at least 1 week prior to admission with resultant lower extremity swelling and dyspnea. BNP greater than 35,000 on presentation.  Responded well to intravenous Lasix  and now euvolemic with slight rise in BUN and creatinine.  We have discontinued intravenous Lasix  and placed her back on torsemide  80 mg daily, which was her prior home dose.  She remains on beta-blocker, hydralazine , and nitrate.  Stressed the importance of cocaine avoidance in the setting of heart failure and beta-blocker usage.  Also stressed the importance of compliance with medications.  No ACE/ARB/ARNI/SGLT2 inhibitor in the  setting of renal failure. She follows at [facility].   2.  Stage IV chronic kidney disease: Baseline creatinine of approximate 3.5 with present creatinine of 4.23.  Renal function initially improved with diuresis though BUN and creatinine up slightly today at 62/3.96.  Discontinue IV Lasix  with plan to  resume home dose of torsemide .  Stressed the importance of compliance.  No ACE/ARB/SGLT2 inhibitor in the setting of advanced kidney disease.   3.  Primary hypertension: Pressures remain variable but overall seem to trend high.  She is on max dose carvedilol  and hydralazine .  Titrate isosorbide  dinitrate to 40 mg 3 times daily.  Consider addition of amlodipine going forward.  4.  Coronary artery disease: Known chronic total occlusion of the right coronary artery.  She has not been having any chest pain and remains on aspirin , beta-blocker, statin, Zetia , and long-acting nitrate.  5.  Hyperlipidemia: On statin and Zetia  with an LDL of 53 through labs at [facility] earlier this month.  LFTs normal.  6.  Macrocytic anemia: H&H 7.9/25.4 this morning, down slightly compared to yesterday.  7.  Cocaine use: Patient denies usage but says she is around others who use.  Strongly encouraged her to change her friend group and avoid cocaine use.  8.  Carotid arterial disease: Bilateral bruits on examination with CTA of the head and neck last year showing moderate bilateral disease.  Continue aspirin , Zetia , and statin.  Signed, Lonni Meager, NP  05/10/2024, 11:39 AM    For questions or updates, please contact   Please consult [email or web address] for contact info under Cardiology/STEMI.

## 2024-05-10 NOTE — Progress Notes (Addendum)
 [facility]  ROUNDING NOTE   Subjective:   [name] [name]  is a 67 y.o.  female  with medical history to include CAD, diabetes, cocaine abuse, depression. HTN, lung ca with LLL lobectomy and chronic kidney disease stage 4. Patient presents to the ED with lower extremity edema and has been admitted for CHF (congestive heart failure) (HCC) [I50.9] Symptomatic anemia [D64.9] Acute on chronic congestive heart failure, unspecified heart failure type [facility]) [I50.9]  Patient is known to our practice and follows with Dr Marcelino, was seen in office on 03/10/24.   Patient sitting up in bed, on room air. No acute events overnight. No complaints to offer. Patient waiting for SNF placement.   Objective:  Vital signs in last 24 hours:  Temp:  [98.5 F (36.9 C)-98.7 F (37.1 C)] 98.6 F (37 C) (11/22 0814) Pulse Rate:  [66-72] 72 (11/22 0814) Resp:  [16-18] 16 (11/22 0814) BP: (123-171)/(60-78) 171/73 (11/22 0814) SpO2:  [99 %-100 %] 100 % (11/22 0814) Weight:  [55.2 kg] 55.2 kg (11/22 0533)  Weight change: -1.3 kg Filed Weights   05/08/24 0432 05/09/24 0434 05/10/24 0533  Weight: 56.6 kg 56.5 kg 55.2 kg    Intake/Output: I/O last 3 completed shifts: In: 830 [P.O.:830] Out: 4200 [Urine:4200]   Intake/Output this shift:  Total I/O In: -  Out: 950 [Urine:950]  Physical Exam: General: NAD  Head: Normocephalic, atraumatic  Eyes: Anicteric  Lungs:  Clear to auscultation, normal effort. Room air  Heart: Regular rate and rhythm  Abdomen:  Soft, nontender  Extremities: No peripheral edema.  Right AKA  Neurologic: Awake, alert, conversant  Skin: Warm,dry, no rash  Access: Left AVF    Basic Metabolic Panel: Recent Labs  Lab 05/07/24 1149 05/08/24 0046 05/09/24 0330 05/10/24 0346  NA 142 142 140 139  140  K 3.4* 3.5 3.4* 4.5  4.6  CL 112* 113* 109 106  106  CO2 17* 18* 20* 23  24  GLUCOSE 214* 220* 170* 188*  188*  BUN 60* 61* 60* 62*  62*  CREATININE 4.23*  4.00* 3.89* 3.96*  3.94*  CALCIUM  8.3* 8.0* 8.1* 8.1*  8.0*  MG  --   --   --  2.2  PHOS  --   --   --  2.6    Liver Function Tests: Recent Labs  Lab 05/07/24 1536 05/08/24 0046 05/10/24 0346  AST 24 19  --   ALT 17 16  --   ALKPHOS 118 112  --   BILITOT <0.2 0.2  --   PROT 5.2* 5.0*  --   ALBUMIN 3.0* 2.9* 2.7*   No results for input(s): LIPASE, AMYLASE in the last 168 hours. No results for input(s): AMMONIA in the last 168 hours.  CBC: Recent Labs  Lab 05/07/24 1149 05/08/24 0046 05/09/24 0330 05/10/24 0346  WBC 5.2 5.4 6.3 5.2  HGB 6.8* 8.0* 8.1* 7.9*  HCT 22.3* 25.8* 25.6* 25.4*  MCV 102.8* 98.9 98.1 98.8  PLT 228 200 215 210    Cardiac Enzymes: No results for input(s): CKTOTAL, CKMB, CKMBINDEX, TROPONINI in the last 168 hours.  BNP: Invalid input(s): POCBNP  CBG: Recent Labs  Lab 05/09/24 1143 05/09/24 1657 05/09/24 2127 05/10/24 0805 05/10/24 1155  GLUCAP 301* 122* 128* 207* 200*    Microbiology: Results for orders placed or performed during the hospital encounter of 01/29/24  C Difficile Quick Screen w PCR reflex     Status: None   Collection Time: 01/31/24  1:38 PM   Specimen: STOOL  Result Value Ref Range Status   C Diff antigen NEGATIVE NEGATIVE Final   C Diff toxin NEGATIVE NEGATIVE Final   C Diff interpretation No C. difficile detected.  Final    Comment: Performed at [facility], [address] [place], [place] [address]    Coagulation Studies: Recent Labs    05/07/24 1536  LABPROT 15.3*  INR 1.1    Urinalysis: Recent Labs    05/07/24 2035  COLORURINE STRAW*  LABSPEC 1.009  PHURINE 7.0  GLUCOSEU 150*  HGBUR NEGATIVE  BILIRUBINUR NEGATIVE  KETONESUR NEGATIVE  PROTEINUR 100*  NITRITE NEGATIVE  LEUKOCYTESUR NEGATIVE      Imaging: No results found.    Medications:     aspirin   81 mg Oral Daily   atorvastatin   80 mg Oral Daily   carvedilol   25 mg Oral BID WC   DULoxetine   40 mg  Oral BID   ezetimibe   10 mg Oral Daily   heparin  injection (subcutaneous)  5,000 Units Subcutaneous Q8H   hydrALAZINE   100 mg Oral Q8H   insulin  aspart  0-5 Units Subcutaneous QHS   insulin  aspart  0-6 Units Subcutaneous TID WC   insulin  aspart  4 Units Subcutaneous TID WC   insulin  glargine-yfgn  4 Units Subcutaneous QHS   isosorbide  dinitrate  40 mg Oral TID   [START ON 05/11/2024] metolazone   2.5 mg Oral Weekly   torsemide   80 mg Oral Daily   acetaminophen  **OR** acetaminophen   Assessment/ Plan:  [name] is a 68 y.o.  female  with medical history to include CAD, diabetes, cocaine abuse, depression. HTN, lung ca with LLL lobectomy and chronic kidney disease stage 4.  Acute Kidney Injury on chronic kidney disease stage 5 with baseline creatinine 3.48 and GFR of 14 on 03/10/2024.  Acute kidney injury secondary to volume overload, likely cardiorenal component.  Prescribed outpatient diuretics, reports compliance.  Has AVF in place, bruit and thrill present.    Creatinine stable.  Lab Results  Component Value Date   CREATININE 3.96 (H) 05/10/2024   CREATININE 3.94 (H) 05/10/2024   CREATININE 3.89 (H) 05/09/2024    Intake/Output Summary (Last 24 hours) at 05/10/2024 1236 Last data filed at 05/10/2024 1213 Gross per 24 hour  Intake 300 ml  Output 3400 ml  Net -3100 ml   2.  Acute on chronic diastolic heart failure.  Last echo on 12/2023 shows EF 50 to 55% with a grade 2 diastolic dysfunction with mitral regurgitation.  Chest x-ray shows mild left basilar atelectasis versus minimal left small pleural effusion.  Prescribed torsemide  80 mg daily outpatient.    Transitioned IV lasix  to PO torsemide  and on metolazone  weekly.   3. Anemia of chronic kidney disease Lab Results  Component Value Date   HGB 7.9 (L) 05/10/2024    Hemoglobin slightly dropped.  One time 4,000 units retacrit  given  4.  Hypertension with chronic kidney disease.  Currently receiving hydralazine ,  isosorbide , and carvedilol .  Also prescribed torsemide  as above.    LOS: 3 [name] [name] 11/22/202512:36 PM  Patient was seen and examined with Ramonita Dines, NP.  Plan of care was formulated for the problems addressed and discussed with NP.  I agree with the note as documented except as noted below.

## 2024-05-10 NOTE — Progress Notes (Signed)
 Daily Progress Note   Patient Name: [name] [name]       Date: 05/10/2024 DOB: [birth year]  Age: 68 y.o. MRN#: [id number] Attending Physician: [name] Hue, MD Primary Care Physician: [name] Toribio SQUIBB, PA Admit Date: 05/07/2024  Reason for Consultation/Follow-up: Establishing goals of care  HPI/Brief Hospital Review: : [name] [name] is a 68 y.o. female with medical history significant of lung cancer, CKD5, DM, HTN, PAD s/p R BKA, chronic systolic Chf who presents to the ED with worseining LE edema. Pt reportedly ran out of diuretic for over one week. Pt resumed home torsemide  on 11/17, however LE remained markedly edematous, unable to fit prosthetic leg. Pt reports over 30lb wt gain in <1 week. Decreased urine output.   Palliative Medicine consulted for assisting with goals of care conversations.  Subjective: Extensive chart review has been completed prior to meeting patient including labs, vital signs, imaging, progress notes, orders, and available advanced directive documents from current and previous encounters.    Visited with [name] [name] at her bedside.  She is awake, alert and able to engage in conversations.  She is complaining of right sided ankle pain as she shares she sprained her ankle several weeks ago.  She is requesting Tylenol -informed nursing staff.  No family or visitors at bedside during time of visit.  [name] [name] shares besides her ankle pain, overall she is feeling better.  Reports resting well at night and a good appetite.  No acute complaints or concerns.  Introduced myself and role.  [name] [name] cannot recall previous visits with palliative care provider earlier in the week.  Inquired about [name] [name] wishing to complete HCPOA documentation.  [name] [name] confirms that she  wishes to appoint her daughter [name] [name] as HCPOA.  Provided advance directive document.  We discussed advance directive in detail, reviewed HCPOA document as well as a living will.  Prior to in-depth living will conversation we discussed CODE STATUS and the difference between full code and DO NOT RESUSCITATE.  [name] [name] is clear she wishes for DNR/DNI at this time.  In completing her living will she outlines her desires for her life not to be prolonged by life-prolonging measures and is not accepting of artificial nutrition.  Assisted [name] [name] with completing advance directive document, order placed to spiritual care to assist with  motorization and witness signatures.  While at bedside, [name] [name] requested I call and speak with her daughter and review decisions made.  Spoke with daughter-[name] [name] informed her of [name]'s decision to appoint her as healthcare power of attorney for which [name] agreed to.  Also discussed with [name] decision for DNI/DNI as well as decisions outlined on living will.  [name] expresses concern related to decisions but ultimately shares she wishes to honor her mother, [name] [name] wishes.  [name] shares she will call [name] [name] at a later time to have further conversations.  Returned to bedside later in the afternoon inquired about conversations [name] had with [employer].  [name] [name] shares conversations went well and [name] [name] remains firm and clear on her decision for DNR/DNI and decisions outlined and living will.  CODE STATUS changed to reflect her wishes.  Will proceed with spiritual care consult to assist with completion.  Answered and addressed all questions and concerns.  PMT to continue to follow for ongoing needs and support.  Objective:  Physical Exam Constitutional:      General: She is not in acute distress.    Appearance: She is not ill-appearing.  HENT:     Head: Normocephalic.     Mouth/Throat:     Mouth: Mucous membranes are moist.  Pulmonary:      Effort: Pulmonary effort is normal. No respiratory distress.  Abdominal:     General: There is no distension.     Palpations: Abdomen is soft.  Skin:    General: Skin is warm and dry.  Neurological:     Mental Status: She is alert and oriented to person, place, and time.     Motor: Weakness present.  Psychiatric:        Mood and Affect: Mood normal.        Behavior: Behavior normal.        Thought Content: Thought content normal.             Vital Signs: BP (!) 129/57 (BP Location: Right Arm)   Pulse 70   Temp 97.9 F (36.6 C) (Oral)   Resp 18   Ht 5' 5 (1.651 m)   Wt 55.2 kg   SpO2 100%   BMI 20.25 kg/m  SpO2: SpO2: 100 % O2 Device: O2 Device: Room Air O2 Flow Rate:     Palliative Care Assessment & Plan   Assessment/Recommendation/Plan  Reviewed and assisted completion of advance directive including HCPOA as well as living will DNR/DNI CODE STATUS order placed PMT to continue to follow for ongoing needs and support  Care plan was discussed with primary team and nursing staff  Thank you for allowing the Palliative Medicine Team to assist in the care of this patient.  Visit includes: Detailed review of medical records (labs, imaging, vital signs), medically appropriate exam (mental status, respiratory, cardiac, skin), discussed with treatment team, counseling and educating patient, family and staff, documenting clinical information, medication management and coordination of care.  Waddell Lesches, DNP, AGNP-C Palliative Medicine   Please contact Palliative Medicine Team phone at [phone] for questions and concerns.

## 2024-05-10 NOTE — Progress Notes (Signed)
 PROGRESS NOTE    [name] [name]  FMW:[id number] DOB: March 23, 1956 DOA: 05/07/2024 PCP: Manya Toribio SQUIBB, PA   Brief Narrative:   68 y.o. female with medical history significant of lung cancer, CKD5, DM, HTN, PAD s/p R BKA, chronic systolic Chf who presents to the ED with worseining LE edema. Pt reportedly ran out of diuretic for over one week. Pt resumed home torsemide  on 11/17, however LE remained markedly edematous, unable to fit prosthetic leg. Pt reports over 30lb wt gain in <1 week. Decreased urine output.   11/20: Cardio, Nephro, Palliative care, DM nurse c/s 11/21: PT, OT eval. DC tele, transfer to med-surg 11/22: Changed to DNR.  Stopped Lasix  and switch to oral metolazone  and torsemide    Assessment & Plan:   Principal Problem:   CHF (congestive heart failure) (HCC) Active Problems:   PAD (peripheral artery disease)   Symptomatic anemia   Coronary artery disease of native artery of native heart with stable angina pectoris   Hypertensive heart and chronic kidney disease with heart failure and with stage 5 chronic kidney disease, or end stage renal disease (HCC)   Acutely decompensated diastolic CHF exacerbation - EF improved on echo July 2025 EF 50 to 55%  - Acute worsening likely as she ran out of her Diuretic/Torsemide  for 1 week and has not taken it - Cardio signed off - Stopped Lasix  and resumed home torsemide  80 mg daily - Continue Coreg  25 mg twice daily - No other GDMT given advanced kidney disease - Continue ASA 81 mg - Continue Lipitor  80 mg daily, Zetia  10 mg daily - Continue Isordil  40 mg 3 times daily   Anemia of CKD - s/p 1 PRBC transfusion for Hb 6.8-> 7.9   Acute on CKD 5 -Nephro following - baseline creat around 3.5 and on admission 4.23->3.94   DM -cont SSI -cont home regimen   HTN -BP stable -Cont home regimen   PAD -s/p RLE BKA, start asa  GOC Palliative care seen.  Now DNR  UDS + for cocaine on 05/07/2024   DVT prophylaxis:  Heparin  heparin  injection 5,000 Units Start: 05/07/24 2230     Code Status: Full code Family Communication: None at bedside Disposition Plan: possible D/C in 1-2 days depending on SNF placement and insurance Auth, ICM working on placement.  Patient is medically stable   Consultants:  Nephro Cardio Palliative care    Subjective:  Continues to improve.  Objective: Vitals:   05/10/24 0533 05/10/24 0545 05/10/24 0814 05/10/24 1441  BP:  (!) 147/66 (!) 171/73 (!) 129/57  Pulse:   72 70  Resp:   16 18  Temp:   98.6 F (37 C) 97.9 F (36.6 C)  TempSrc:    Oral  SpO2:   100% 100%  Weight: 55.2 kg     Height:        Intake/Output Summary (Last 24 hours) at 05/10/2024 1512 Last data filed at 05/10/2024 1418 Gross per 24 hour  Intake 540 ml  Output 2700 ml  Net -2160 ml   Filed Weights   05/08/24 0432 05/09/24 0434 05/10/24 0533  Weight: 56.6 kg 56.5 kg 55.2 kg    Examination:  General exam: Appears calm and comfortable  Respiratory system: Clear to auscultation. Respiratory effort normal. Cardiovascular system: S1 & S2 heard, RRR.  No pedal edema. Gastrointestinal system: Abdomen is soft, benign Central nervous system: Alert and oriented. No focal neurological deficits. Extremities: Rt BKA HD Access: Left AVF  Psychiatry: Judgement and insight  appear normal. Mood & affect appropriate.     Data Reviewed: I have personally reviewed following labs and imaging studies  CBC: Recent Labs  Lab 05/07/24 1149 05/08/24 0046 05/09/24 0330 05/10/24 0346  WBC 5.2 5.4 6.3 5.2  HGB 6.8* 8.0* 8.1* 7.9*  HCT 22.3* 25.8* 25.6* 25.4*  MCV 102.8* 98.9 98.1 98.8  PLT 228 200 215 210   Basic Metabolic Panel: Recent Labs  Lab 05/07/24 1149 05/08/24 0046 05/09/24 0330 05/10/24 0346  NA 142 142 140 139  140  K 3.4* 3.5 3.4* 4.5  4.6  CL 112* 113* 109 106  106  CO2 17* 18* 20* 23  24  GLUCOSE 214* 220* 170* 188*  188*  BUN 60* 61* 60* 62*  62*  CREATININE  4.23* 4.00* 3.89* 3.96*  3.94*  CALCIUM  8.3* 8.0* 8.1* 8.1*  8.0*  MG  --   --   --  2.2  PHOS  --   --   --  2.6   GFR: Estimated Creatinine Clearance: 12.2 mL/min (A) (by C-G formula based on SCr of 3.96 mg/dL (H)). Liver Function Tests: Recent Labs  Lab 05/07/24 1536 05/08/24 0046 05/10/24 0346  AST 24 19  --   ALT 17 16  --   ALKPHOS 118 112  --   BILITOT <0.2 0.2  --   PROT 5.2* 5.0*  --   ALBUMIN 3.0* 2.9* 2.7*   No results for input(s): LIPASE, AMYLASE in the last 168 hours. No results for input(s): AMMONIA in the last 168 hours. Coagulation Profile: Recent Labs  Lab 05/07/24 1536  INR 1.1   Cardiac Enzymes: No results for input(s): CKTOTAL, CKMB, CKMBINDEX, TROPONINI in the last 168 hours. BNP (last 3 results) Recent Labs    05/07/24 1536  PROBNP >35,000.0*   HbA1C: No results for input(s): HGBA1C in the last 72 hours. CBG: Recent Labs  Lab 05/09/24 1143 05/09/24 1657 05/09/24 2127 05/10/24 0805 05/10/24 1155  GLUCAP 301* 122* 128* 207* 200*    Anemia Panel: Recent Labs    05/08/24 0046  VITAMINB12 252  FOLATE 8.7  TIBC 218*  IRON  63    Radiology Studies: No results found.       Scheduled Meds:  aspirin   81 mg Oral Daily   atorvastatin   80 mg Oral Daily   carvedilol   25 mg Oral BID WC   DULoxetine   40 mg Oral BID   ezetimibe   10 mg Oral Daily   heparin  injection (subcutaneous)  5,000 Units Subcutaneous Q8H   hydrALAZINE   100 mg Oral Q8H   insulin  aspart  0-5 Units Subcutaneous QHS   insulin  aspart  0-6 Units Subcutaneous TID WC   insulin  aspart  4 Units Subcutaneous TID WC   insulin  glargine-yfgn  4 Units Subcutaneous QHS   isosorbide  dinitrate  40 mg Oral TID   [START ON 05/11/2024] metolazone   2.5 mg Oral Weekly   torsemide   80 mg Oral Daily   Continuous Infusions:   LOS: 3 days    Time spent: 35 mins    Raunak Antuna Maree, MD [facility] Pager [phone]  If 7PM-7AM, please contact  night-coverage [email or web address]  05/10/2024, 3:12 PM

## 2024-05-10 NOTE — Progress Notes (Signed)
 PHARMACY CONSULT NOTE - ELECTROLYTES  Pharmacy Consult for Electrolyte Monitoring and Replacement   Recent Labs: Height: 5' 5 (165.1 cm) Weight: 55.2 kg (121 lb 11.1 oz) IBW/kg (Calculated) : 57 Estimated Creatinine Clearance: 12.2 mL/min (A) (by C-G formula based on SCr of 3.96 mg/dL (H)). Potassium (mmol/L)  Date Value  05/10/2024 4.5  05/10/2024 4.6  04/02/2014 3.8   Magnesium  (mg/dL)  Date Value  88/77/7974 2.2   Calcium  (mg/dL)  Date Value  88/77/7974 8.1 (L)  05/10/2024 8.0 (L)   Calcium , Total (mg/dL)  Date Value  89/84/7984 8.1 (L)   Albumin (g/dL)  Date Value  88/77/7974 2.7 (L)  07/09/2023 3.1 (L)  04/01/2014 3.3 (L)   Phosphorus (mg/dL)  Date Value  88/77/7974 2.6   Sodium (mmol/L)  Date Value  05/10/2024 139  05/10/2024 140  07/09/2023 135  04/02/2014 141   Assessment  [name] [name] is a 68 y.o. female presenting with worsening LE edema. PMH significant for lung cancer, CKD5, DM, HTN, PAD s/p R BKA, chronic systolic Chf. Pharmacy has been consulted to monitor and replace electrolytes.  Diet: PO MIVF: N/A Pertinent medications: Lasix  80 mg IV BID  Goal of Therapy: Electrolytes WNL  Plan:  No electrolyte replacement indicated at this time BMP tomorrow  Thank you for allowing pharmacy to be a part of this patient's care.  Marolyn [name] 05/10/2024 6:41 AM

## 2024-05-10 NOTE — Care Management Important Message (Signed)
 Important Message  Patient Details  Name: [name] [name] MRN: [id number] Date of Birth: 1956/02/26   Important Message Given:  Yes - Medicare IM     [name] 05/10/2024, 1:35 PM

## 2024-05-11 DIAGNOSIS — I509 Heart failure, unspecified: Secondary | ICD-10-CM | POA: Diagnosis not present

## 2024-05-11 DIAGNOSIS — I739 Peripheral vascular disease, unspecified: Secondary | ICD-10-CM | POA: Diagnosis not present

## 2024-05-11 DIAGNOSIS — D649 Anemia, unspecified: Secondary | ICD-10-CM | POA: Diagnosis not present

## 2024-05-11 DIAGNOSIS — I5032 Chronic diastolic (congestive) heart failure: Secondary | ICD-10-CM | POA: Diagnosis not present

## 2024-05-11 DIAGNOSIS — Z515 Encounter for palliative care: Secondary | ICD-10-CM | POA: Diagnosis not present

## 2024-05-11 DIAGNOSIS — I25118 Atherosclerotic heart disease of native coronary artery with other forms of angina pectoris: Secondary | ICD-10-CM | POA: Diagnosis not present

## 2024-05-11 DIAGNOSIS — I132 Hypertensive heart and chronic kidney disease with heart failure and with stage 5 chronic kidney disease, or end stage renal disease: Secondary | ICD-10-CM | POA: Diagnosis not present

## 2024-05-11 LAB — BASIC METABOLIC PANEL WITH GFR
Anion gap: 8 (ref 5–15)
BUN: 72 mg/dL — ABNORMAL HIGH (ref 8–23)
CO2: 25 mmol/L (ref 22–32)
Calcium: 8.1 mg/dL — ABNORMAL LOW (ref 8.9–10.3)
Chloride: 104 mmol/L (ref 98–111)
Creatinine, Ser: 4 mg/dL — ABNORMAL HIGH (ref 0.44–1.00)
GFR, Estimated: 12 mL/min — ABNORMAL LOW (ref >60)
Glucose, Bld: 238 mg/dL — ABNORMAL HIGH (ref 70–99)
Potassium: 4.7 mmol/L (ref 3.5–5.1)
Sodium: 137 mmol/L (ref 135–145)

## 2024-05-11 LAB — CBC
HCT: 25.3 % — ABNORMAL LOW (ref 36.0–46.0)
Hemoglobin: 8 g/dL — ABNORMAL LOW (ref 12.0–15.0)
MCH: 31.3 pg (ref 26.0–34.0)
MCHC: 31.6 g/dL (ref 30.0–36.0)
MCV: 98.8 fL (ref 80.0–100.0)
Platelets: 204 K/uL (ref 150–400)
RBC: 2.56 MIL/uL — ABNORMAL LOW (ref 3.87–5.11)
RDW: 18.1 % — ABNORMAL HIGH (ref 11.5–15.5)
WBC: 5.4 K/uL (ref 4.0–10.5)
nRBC: 0 % (ref 0.0–0.2)

## 2024-05-11 LAB — GLUCOSE, CAPILLARY
Glucose-Capillary: 257 mg/dL — ABNORMAL HIGH (ref 70–99)
Glucose-Capillary: 176 mg/dL — ABNORMAL HIGH (ref 70–99)
Glucose-Capillary: 140 mg/dL — ABNORMAL HIGH (ref 70–99)
Glucose-Capillary: 156 mg/dL — ABNORMAL HIGH (ref 70–99)

## 2024-05-11 NOTE — Progress Notes (Signed)
 [facility]  ROUNDING NOTE   Subjective:   [name] [name]  is a 68 y.o.  female  with medical history to include CAD, diabetes, cocaine abuse, depression. HTN, lung ca with LLL lobectomy and chronic kidney disease stage 4. Patient presents to the ED with lower extremity edema and has been admitted for CHF (congestive heart failure) (HCC) [I50.9] Symptomatic anemia [D64.9] Acute on chronic congestive heart failure, unspecified heart failure type [facility]) [I50.9]  Patient is known to our practice and follows with Dr [name] [name], was seen in office on 03/10/24.   Update: Patient has no complaints to offer. Room air Waiting for prosthetic from home to ensure it fits well before discharge. Patient waiting for SNF placement.   Objective:  Vital signs in last 24 hours:  Temp:  [97.5 F (36.4 C)-98.4 F (36.9 C)] 98 F (36.7 C) (11/23 0730) Pulse Rate:  [70-75] 75 (11/23 0850) Resp:  [16-18] 17 (11/23 0730) BP: (129-163)/(57-77) 163/63 (11/23 0850) SpO2:  [100 %] 100 % (11/23 0730) Weight:  [52.6 kg] 52.6 kg (11/23 0509)  Weight change: -2.6 kg Filed Weights   05/09/24 0434 05/10/24 0533 05/11/24 0509  Weight: 56.5 kg 55.2 kg 52.6 kg    Intake/Output: I/O last 3 completed shifts: In: 240 [P.O.:240] Out: 2600 [Urine:2600]   Intake/Output this shift:  Total I/O In: -  Out: 400 [Urine:400]  Physical Exam: General: NAD  Head: Normocephalic, atraumatic  Eyes: Anicteric  Lungs:  Room air  Heart: Regular rate and rhythm  Abdomen:  Soft, nontender  Extremities: No peripheral edema.  Right AKA  Neurologic: Awake, alert, conversant  Skin: Warm,dry, no rash  Access: Left AVF    Basic Metabolic Panel: Recent Labs  Lab 05/07/24 1149 05/08/24 0046 05/09/24 0330 05/10/24 0346 05/11/24 0342  NA 142 142 140 139  140 137  K 3.4* 3.5 3.4* 4.5  4.6 4.7  CL 112* 113* 109 106  106 104  CO2 17* 18* 20* 23  24 25   GLUCOSE 214* 220* 170* 188*  188* 238*  BUN 60* 61*  60* 62*  62* 72*  CREATININE 4.23* 4.00* 3.89* 3.96*  3.94* 4.00*  CALCIUM  8.3* 8.0* 8.1* 8.1*  8.0* 8.1*  MG  --   --   --  2.2  --   PHOS  --   --   --  2.6  --     Liver Function Tests: Recent Labs  Lab 05/07/24 1536 05/08/24 0046 05/10/24 0346  AST 24 19  --   ALT 17 16  --   ALKPHOS 118 112  --   BILITOT <0.2 0.2  --   PROT 5.2* 5.0*  --   ALBUMIN 3.0* 2.9* 2.7*   No results for input(s): LIPASE, AMYLASE in the last 168 hours. No results for input(s): AMMONIA in the last 168 hours.  CBC: Recent Labs  Lab 05/07/24 1149 05/08/24 0046 05/09/24 0330 05/10/24 0346 05/11/24 0342  WBC 5.2 5.4 6.3 5.2 5.4  HGB 6.8* 8.0* 8.1* 7.9* 8.0*  HCT 22.3* 25.8* 25.6* 25.4* 25.3*  MCV 102.8* 98.9 98.1 98.8 98.8  PLT 228 200 215 210 204    Cardiac Enzymes: No results for input(s): CKTOTAL, CKMB, CKMBINDEX, TROPONINI in the last 168 hours.  BNP: Invalid input(s): POCBNP  CBG: Recent Labs  Lab 05/10/24 1155 05/10/24 1632 05/10/24 2124 05/11/24 0736 05/11/24 1129  GLUCAP 200* 214* 177* 257* 176*    Microbiology: Results for orders placed or performed during the hospital encounter  of 01/29/24  C Difficile Quick Screen w PCR reflex     Status: None   Collection Time: 01/31/24  1:38 PM   Specimen: STOOL  Result Value Ref Range Status   C Diff antigen NEGATIVE NEGATIVE Final   C Diff toxin NEGATIVE NEGATIVE Final   C Diff interpretation No C. difficile detected.  Final    Comment: Performed at [facility], [address] [place], [place] [address]    Coagulation Studies: No results for input(s): LABPROT, INR in the last 72 hours.   Urinalysis: No results for input(s): COLORURINE, LABSPEC, PHURINE, GLUCOSEU, HGBUR, BILIRUBINUR, KETONESUR, PROTEINUR, UROBILINOGEN, NITRITE, LEUKOCYTESUR in the last 72 hours.  Invalid input(s): APPERANCEUR     Imaging: No results found.    Medications:     aspirin    81 mg Oral Daily   atorvastatin   80 mg Oral Daily   carvedilol   25 mg Oral BID WC   DULoxetine   40 mg Oral BID   ezetimibe   10 mg Oral Daily   heparin  injection (subcutaneous)  5,000 Units Subcutaneous Q8H   hydrALAZINE   100 mg Oral Q8H   insulin  aspart  0-5 Units Subcutaneous QHS   insulin  aspart  0-6 Units Subcutaneous TID WC   insulin  aspart  4 Units Subcutaneous TID WC   insulin  glargine-yfgn  4 Units Subcutaneous QHS   isosorbide  dinitrate  40 mg Oral TID   metolazone   2.5 mg Oral Weekly   torsemide   80 mg Oral Daily   acetaminophen  **OR** acetaminophen   Assessment/ Plan:  [name] is a 68 y.o.  female  with medical history to include CAD, diabetes, cocaine abuse, depression. HTN, lung ca with LLL lobectomy and chronic kidney disease stage 4.  Acute Kidney Injury on chronic kidney disease stage 5 with baseline creatinine 3.48 and GFR of 14 on 03/10/2024.  Acute kidney injury secondary to volume overload, likely cardiorenal component.  Prescribed outpatient diuretics, reports compliance.  Has AVF in place, bruit and thrill present.    Creatinine stable.  Lab Results  Component Value Date   CREATININE 4.00 (H) 05/11/2024   CREATININE 3.96 (H) 05/10/2024   CREATININE 3.94 (H) 05/10/2024    Intake/Output Summary (Last 24 hours) at 05/11/2024 1308 Last data filed at 05/11/2024 1147 Gross per 24 hour  Intake 240 ml  Output 1000 ml  Net -760 ml   2.  Acute on chronic diastolic heart failure.  Last echo on 12/2023 shows EF 50 to 55% with a grade 2 diastolic dysfunction with mitral regurgitation.  Chest x-ray shows mild left basilar atelectasis versus minimal left small pleural effusion.  Prescribed torsemide  80 mg daily outpatient.    Continue PO torsemide  daily and on metolazone  weekly.   3. Anemia of chronic kidney disease Lab Results  Component Value Date   HGB 8.0 (L) 05/11/2024    Hemoglobin improved.    4.  Hypertension with chronic kidney disease.   Currently receiving hydralazine , isosorbide , and carvedilol .  Also prescribed torsemide  as above. BP 163/63 today    LOS: 4 [name] [name] [name] [name] 11/23/20251:08 PM  Patient was seen and examined with [name] Dines, NP.  Plan of care was formulated for the problems addressed and discussed with NP.  I agree with the note as documented except as noted below.

## 2024-05-11 NOTE — TOC Progression Note (Signed)
 Transition of Care [facility]) - Progression Note    Patient Details  Name: [name] [name] MRN: [id number] Date of Birth: May 25, 1956  [facility] CM/SW Contact  K'La JINNY Ruts, LCSW Phone Number: 05/11/2024, 10:50 AM  Clinical Narrative:    Chart reviewed. I spoke with the patient at bedside today. I informed the patient that she got a bed offer at [facility] and the patient reports that she would like a closer placement. SW is waiting for more bed offers for patient.    Expected Discharge Plan: Home/Self Care Barriers to Discharge: Continued Medical Work up               Expected Discharge Plan and Services     Post Acute Care Choice: NA Living arrangements for the past 2 months: Single Family Home                                       Social Drivers of Health (SDOH) Interventions SDOH Screenings   Food Insecurity: No Food Insecurity (05/08/2024)  Housing: Low Risk  (05/08/2024)  Transportation Needs: No Transportation Needs (05/08/2024)  Utilities: Not At Risk (05/08/2024)  Depression (PHQ2-9): High Risk (02/06/2024)  Financial Resource Strain: Low Risk  (06/29/2023)   Received from [facility]  Social Connections: Moderately Integrated (05/08/2024)  Stress: Stress Concern Present (12/15/2021)  Tobacco Use: Medium Risk (05/07/2024)    Readmission Risk Interventions    05/08/2024    2:20 PM  Readmission Risk Prevention Plan  Transportation Screening Complete  Medication Review (RN Care Manager) Referral to Pharmacy  PCP or Specialist appointment within 3-5 days of discharge Complete  SW Recovery Care/Counseling Consult Complete  Palliative Care Screening Not Applicable  Skilled Nursing Facility Not Applicable

## 2024-05-11 NOTE — Progress Notes (Signed)
 Daily Progress Note   Patient Name: [name] [name]       Date: 05/11/2024 DOB: 04/01/1956  Age: 67 y.o. MRN#: [id number] Attending Physician: Maree Hue, MD Primary Care Physician: Manya Toribio SQUIBB, PA Admit Date: 05/07/2024  Reason for Consultation/Follow-up: Establishing goals of care  HPI/Brief Hospital Review: [name] [name] is a 68 y.o. female with medical history significant of lung cancer, CKD5, DM, HTN, PAD s/p R BKA, chronic systolic Chf who presents to the ED with worseining LE edema. Pt reportedly ran out of diuretic for over one week. Pt resumed home torsemide  on 11/17, however LE remained markedly edematous, unable to fit prosthetic leg. Pt reports over 30lb wt gain in <1 week. Decreased urine output.    Palliative Medicine consulted for assisting with goals of care conversations.  Subjective: Extensive chart review has been completed prior to meeting patient including labs, vital signs, imaging, progress notes, orders, and available advanced directive documents from current and previous encounters.    Visited with [name] [name] at her bedside.  She is awake, alert and able to engage in conversations.  No family or visitors at bedside during time of visit.  [name] [name] shares she is feeling well today without acute complaints.  She shares the pain in her ankle from a previous strain is feeling better and responds well to acetaminophen .  She reports resting well overnight and was able to tolerate her breakfast.  She shares provider's have been in and are discussing discharge.  She is aware she will likely discharge to a skilled nursing facility.  Answered and addressed all questions and concerns.  PMT to step away from daily visits as goals and plans remain clear.  Will remain  available peripherally, please reengage as needs or concerns arise.  Objective:  Physical Exam Constitutional:      General: She is not in acute distress.    Appearance: She is not ill-appearing.  HENT:     Head: Normocephalic.     Mouth/Throat:     Mouth: Mucous membranes are moist.  Pulmonary:     Effort: Pulmonary effort is normal. No respiratory distress.  Abdominal:     General: There is no distension.     Palpations: Abdomen is soft.  Skin:    General: Skin is warm and dry.  Neurological:  Mental Status: She is alert and oriented to person, place, and time.     Motor: Weakness present.  Psychiatric:        Mood and Affect: Mood normal.        Behavior: Behavior normal.        Thought Content: Thought content normal.             Vital Signs: BP (!) 136/59   Pulse 68   Temp 98.3 F (36.8 C) (Oral)   Resp 16   Ht 5' 5 (1.651 m)   Wt 52.6 kg   SpO2 100%   BMI 19.30 kg/m  SpO2: SpO2: 100 % O2 Device: O2 Device: Room Air O2 Flow Rate:     Palliative Care Assessment & Plan   Assessment/Recommendation/Plan  Still awaiting spiritual care to assist with completion including notary and witness signature of advance directive document left at bedside PMT to step away from daily visits please reengage as needs or concerns arise  Thank you for allowing the Palliative Medicine Team to assist in the care of this patient.  Visit includes: Detailed review of medical records (labs, imaging, vital signs), medically appropriate exam (mental status, respiratory, cardiac, skin), discussed with treatment team, counseling and educating patient, family and staff, documenting clinical information, medication management and coordination of care.  Waddell Lesches, DNP, AGNP-C Palliative Medicine   Please contact Palliative Medicine Team phone at [phone] for questions and concerns.

## 2024-05-11 NOTE — Progress Notes (Signed)
 PROGRESS NOTE    [name] [name]  FMW:[id number] DOB: 03-07-1956 DOA: 05/07/2024 PCP: Manya Toribio SQUIBB, PA   Brief Narrative:   68 y.o. female with medical history significant of lung cancer, CKD5, DM, HTN, PAD s/p R BKA, chronic systolic Chf who presents to the ED with worseining LE edema. Pt reportedly ran out of diuretic for over one week. Pt resumed home torsemide  on 11/17, however LE remained markedly edematous, unable to fit prosthetic leg. Pt reports over 30lb wt gain in <1 week. Decreased urine output.   11/20: Cardio, Nephro, Palliative care, DM nurse c/s 11/21: PT, OT eval. DC tele, transfer to med-surg 11/22: Changed to DNR.  Stopped Lasix  and switch to oral metolazone  and torsemide  11/23: Waiting for her prosthesis to come from home that way she can check if it fits   Assessment & Plan:   Principal Problem:   CHF (congestive heart failure) (HCC) Active Problems:   PAD (peripheral artery disease)   Symptomatic anemia   Coronary artery disease of native artery of native heart with stable angina pectoris   Hypertensive heart and chronic kidney disease with heart failure and with stage 5 chronic kidney disease, or end stage renal disease (HCC)   Acutely decompensated diastolic CHF exacerbation - EF improved on echo July 2025 EF 50 to 55%  - Acute worsening likely as she ran out of her Diuretic/Torsemide  for 1 week and has not taken it - Cardio signed off - Stopped Lasix  and resumed home torsemide  80 mg daily - Continue Coreg  25 mg twice daily - No other GDMT given advanced kidney disease - Continue ASA 81 mg - Continue Lipitor  80 mg daily, Zetia  10 mg daily - Continue Isordil  40 mg 3 times daily   Anemia of CKD - s/p 1 PRBC transfusion for Hb 6.8-> 7.9   Acute on CKD 5 -Nephro following - baseline creat around 3.5 and on admission 4.23-> 4.0   DM -cont SSI -cont home regimen   HTN -BP stable -Cont home regimen   PAD -s/p RLE BKA, start  asa  GOC Palliative care seen.  Now DNR  UDS + for cocaine on 05/07/2024   DVT prophylaxis: Heparin  heparin  injection 5,000 Units Start: 05/07/24 2230     Code Status: DNR Family Communication: None at bedside Disposition Plan: possible D/C in 1-2 days depending on SNF placement and insurance Auth, ICM working on placement.  Patient is medically stable   Consultants:  Nephro Cardio Palliative care    Subjective:  Waiting for her prosthesis to come from home that way she can check if it fits  Objective: Vitals:   05/11/24 0759 05/11/24 0850 05/11/24 1307 05/11/24 1336  BP: (!) 162/67 (!) 163/63 (!) 136/59 (!) 136/59  Pulse: 72 75 68   Resp:   16   Temp:   98.3 F (36.8 C)   TempSrc:   Oral   SpO2:   100%   Weight:      Height:        Intake/Output Summary (Last 24 hours) at 05/11/2024 1524 Last data filed at 05/11/2024 1300 Gross per 24 hour  Intake 240 ml  Output 1000 ml  Net -760 ml   Filed Weights   05/09/24 0434 05/10/24 0533 05/11/24 0509  Weight: 56.5 kg 55.2 kg 52.6 kg    Examination:  General exam: Appears calm and comfortable  Respiratory system: Clear to auscultation. Respiratory effort normal. Cardiovascular system: S1 & S2 heard, RRR.  No pedal edema. Gastrointestinal  system: Abdomen is soft, benign Central nervous system: Alert and oriented. No focal neurological deficits. Extremities: Rt BKA HD Access: Left AVF  Psychiatry: Judgement and insight appear normal. Mood & affect appropriate.     Data Reviewed: I have personally reviewed following labs and imaging studies  CBC: Recent Labs  Lab 05/07/24 1149 05/08/24 0046 05/09/24 0330 05/10/24 0346 05/11/24 0342  WBC 5.2 5.4 6.3 5.2 5.4  HGB 6.8* 8.0* 8.1* 7.9* 8.0*  HCT 22.3* 25.8* 25.6* 25.4* 25.3*  MCV 102.8* 98.9 98.1 98.8 98.8  PLT 228 200 215 210 204   Basic Metabolic Panel: Recent Labs  Lab 05/07/24 1149 05/08/24 0046 05/09/24 0330 05/10/24 0346 05/11/24 0342   NA 142 142 140 139  140 137  K 3.4* 3.5 3.4* 4.5  4.6 4.7  CL 112* 113* 109 106  106 104  CO2 17* 18* 20* 23  24 25   GLUCOSE 214* 220* 170* 188*  188* 238*  BUN 60* 61* 60* 62*  62* 72*  CREATININE 4.23* 4.00* 3.89* 3.96*  3.94* 4.00*  CALCIUM  8.3* 8.0* 8.1* 8.1*  8.0* 8.1*  MG  --   --   --  2.2  --   PHOS  --   --   --  2.6  --    GFR: Estimated Creatinine Clearance: 11.5 mL/min (A) (by C-G formula based on SCr of 4 mg/dL (H)). Liver Function Tests: Recent Labs  Lab 05/07/24 1536 05/08/24 0046 05/10/24 0346  AST 24 19  --   ALT 17 16  --   ALKPHOS 118 112  --   BILITOT <0.2 0.2  --   PROT 5.2* 5.0*  --   ALBUMIN 3.0* 2.9* 2.7*   No results for input(s): LIPASE, AMYLASE in the last 168 hours. No results for input(s): AMMONIA in the last 168 hours. Coagulation Profile: Recent Labs  Lab 05/07/24 1536  INR 1.1   Cardiac Enzymes: No results for input(s): CKTOTAL, CKMB, CKMBINDEX, TROPONINI in the last 168 hours. BNP (last 3 results) Recent Labs    05/07/24 1536  PROBNP >35,000.0*   HbA1C: No results for input(s): HGBA1C in the last 72 hours. CBG: Recent Labs  Lab 05/10/24 1155 05/10/24 1632 05/10/24 2124 05/11/24 0736 05/11/24 1129  GLUCAP 200* 214* 177* 257* 176*    Anemia Panel: No results for input(s): VITAMINB12, FOLATE, FERRITIN, TIBC, IRON , RETICCTPCT in the last 72 hours.   Radiology Studies: No results found.       Scheduled Meds:  aspirin   81 mg Oral Daily   atorvastatin   80 mg Oral Daily   carvedilol   25 mg Oral BID WC   DULoxetine   40 mg Oral BID   ezetimibe   10 mg Oral Daily   heparin  injection (subcutaneous)  5,000 Units Subcutaneous Q8H   hydrALAZINE   100 mg Oral Q8H   insulin  aspart  0-5 Units Subcutaneous QHS   insulin  aspart  0-6 Units Subcutaneous TID WC   insulin  aspart  4 Units Subcutaneous TID WC   insulin  glargine-yfgn  4 Units Subcutaneous QHS   isosorbide  dinitrate  40 mg Oral TID    metolazone   2.5 mg Oral Weekly   torsemide   80 mg Oral Daily   Continuous Infusions:   LOS: 4 days    Time spent: 35 mins    Cresencio Fairly, MD [facility] Pager [phone]  If 7PM-7AM, please contact night-coverage [email or web address]  05/11/2024, 3:24 PM

## 2024-05-11 NOTE — Progress Notes (Signed)
 PHARMACY CONSULT NOTE - ELECTROLYTES  Pharmacy Consult for Electrolyte Monitoring and Replacement   Recent Labs: Height: 5' 5 (165.1 cm) Weight: 55.2 kg (121 lb 11.1 oz) IBW/kg (Calculated) : 57 Estimated Creatinine Clearance: 12.1 mL/min (A) (by C-G formula based on SCr of 4 mg/dL (H)). Potassium (mmol/L)  Date Value  05/11/2024 4.7  04/02/2014 3.8   Magnesium  (mg/dL)  Date Value  88/77/7974 2.2   Calcium  (mg/dL)  Date Value  88/76/7974 8.1 (L)   Calcium , Total (mg/dL)  Date Value  89/84/7984 8.1 (L)   Albumin (g/dL)  Date Value  88/77/7974 2.7 (L)  07/09/2023 3.1 (L)  04/01/2014 3.3 (L)   Phosphorus (mg/dL)  Date Value  88/77/7974 2.6   Sodium (mmol/L)  Date Value  05/11/2024 137  07/09/2023 135  04/02/2014 141   Assessment  [name] [name] is a 68 y.o. female presenting with worsening LE edema. PMH significant for lung cancer, CKD5, DM, HTN, PAD s/p R BKA, chronic systolic Chf. Pharmacy has been consulted to monitor and replace electrolytes.  Diet: PO MIVF: N/A Pertinent medications: Metolazone  weekly & torsemide  daily  Goal of Therapy: Electrolytes WNL  Plan:  No electrolyte replacement indicated at this time Will sign off on consult given stability in electrolytes  Thank you for allowing pharmacy to be a part of this patient's care.  [name] [name] 05/11/2024 6:39 AM

## 2024-05-11 NOTE — Plan of Care (Signed)

## 2024-05-12 DIAGNOSIS — I132 Hypertensive heart and chronic kidney disease with heart failure and with stage 5 chronic kidney disease, or end stage renal disease: Secondary | ICD-10-CM | POA: Diagnosis not present

## 2024-05-12 DIAGNOSIS — I25118 Atherosclerotic heart disease of native coronary artery with other forms of angina pectoris: Secondary | ICD-10-CM | POA: Diagnosis not present

## 2024-05-12 DIAGNOSIS — I739 Peripheral vascular disease, unspecified: Secondary | ICD-10-CM | POA: Diagnosis not present

## 2024-05-12 DIAGNOSIS — I5032 Chronic diastolic (congestive) heart failure: Secondary | ICD-10-CM | POA: Diagnosis not present

## 2024-05-12 LAB — IRON AND TIBC
Iron: 57 ug/dL (ref 28–170)
Saturation Ratios: 24 % (ref 10.4–31.8)
TIBC: 237 ug/dL — ABNORMAL LOW (ref 250–450)
UIBC: 179 ug/dL

## 2024-05-12 LAB — CBC
HCT: 28 % — ABNORMAL LOW (ref 36.0–46.0)
Hemoglobin: 8.7 g/dL — ABNORMAL LOW (ref 12.0–15.0)
MCH: 30.9 pg (ref 26.0–34.0)
MCHC: 31.1 g/dL (ref 30.0–36.0)
MCV: 99.3 fL (ref 80.0–100.0)
Platelets: 209 K/uL (ref 150–400)
RBC: 2.82 MIL/uL — ABNORMAL LOW (ref 3.87–5.11)
RDW: 18.1 % — ABNORMAL HIGH (ref 11.5–15.5)
WBC: 5.7 K/uL (ref 4.0–10.5)
nRBC: 0 % (ref 0.0–0.2)

## 2024-05-12 LAB — BASIC METABOLIC PANEL WITH GFR
Anion gap: 7 (ref 5–15)
BUN: 79 mg/dL — ABNORMAL HIGH (ref 8–23)
CO2: 27 mmol/L (ref 22–32)
Calcium: 8.2 mg/dL — ABNORMAL LOW (ref 8.9–10.3)
Chloride: 103 mmol/L (ref 98–111)
Creatinine, Ser: 3.93 mg/dL — ABNORMAL HIGH (ref 0.44–1.00)
GFR, Estimated: 12 mL/min — ABNORMAL LOW (ref >60)
Glucose, Bld: 206 mg/dL — ABNORMAL HIGH (ref 70–99)
Potassium: 5.5 mmol/L — ABNORMAL HIGH (ref 3.5–5.1)
Sodium: 136 mmol/L (ref 135–145)

## 2024-05-12 LAB — GLUCOSE, CAPILLARY
Glucose-Capillary: 234 mg/dL — ABNORMAL HIGH (ref 70–99)
Glucose-Capillary: 259 mg/dL — ABNORMAL HIGH (ref 70–99)
Glucose-Capillary: 279 mg/dL — ABNORMAL HIGH (ref 70–99)
Glucose-Capillary: 379 mg/dL — ABNORMAL HIGH (ref 70–99)

## 2024-05-12 LAB — FERRITIN: Ferritin: 309 ng/mL — ABNORMAL HIGH (ref 11–307)

## 2024-05-12 LAB — POTASSIUM
Potassium: 5.4 mmol/L — ABNORMAL HIGH (ref 3.5–5.1)
Potassium: 5.6 mmol/L — ABNORMAL HIGH (ref 3.5–5.1)

## 2024-05-12 MED ORDER — PATIROMER SORBITEX CALCIUM 8.4 G PO PACK
16.8000 g | PACK | ORAL | Status: AC
  Administered 2024-05-13: 16.8 g via ORAL
  Filled 2024-05-12: qty 2

## 2024-05-12 NOTE — Progress Notes (Signed)
   05/12/24 1115  Spiritual Encounters  Type of Visit Initial  Care provided to: Patient  Conversation partners present during encounter Nurse  Referral source Patient request  Reason for visit Advance directives  OnCall Visit No  Advance Directives (For Healthcare)  Does Patient Have a Medical Advance Directive? Yes  Type of Estate Agent of [place];Living will  Copy of Healthcare Power of Attorney in Chart? Yes - validated most recent copy scanned in chart (See row information)  Copy of Living Will in Chart? Yes - validated most recent copy scanned in chart (See row information)   Spiritual Consult to complete AD.  Health Care POA and Living Will scanned to chart.

## 2024-05-12 NOTE — Inpatient Diabetes Management (Signed)
 Inpatient Diabetes Program Recommendations  AACE/ADA: New Consensus Statement on Inpatient Glycemic Control (2015)  Target Ranges:  Prepandial:   less than 140 mg/dL      Peak postprandial:   less than 180 mg/dL (1-2 hours)      Critically ill patients:  140 - 180 mg/dL    Latest Reference Range & Units 05/11/24 07:36 05/11/24 11:29 05/11/24 16:15 05/11/24 21:21  Glucose-Capillary 70 - 99 mg/dL 742 (H)  7 units Novolog   176 (H)  5 units Novolog   140 (H)  4 units Novolog   156 (H)   4 units Semglee   (H): Data is abnormally high  Latest Reference Range & Units 05/12/24 08:51 05/12/24 12:01  Glucose-Capillary 70 - 99 mg/dL 765 (H)  6 units Novolog   259 (H)  7 units Novolog    (H): Data is abnormally high    Home DM Meds: Lantus  4 units every day Humalog  4 units TID + SSI Dexcom G7   Current Orders: Semglee  4 units at HS Novolog  0-6 units TID A C+ HS Novolog  4 units TID with meals   MD- Please consider increasing the Semglee  very slightly to 5 units at HS    --Will follow patient during hospitalization--  Adina Rudolpho Arrow RN, MSN, CDCES Diabetes Coordinator Inpatient Glycemic Control Team Team Pager: [phone] (8a-5p)

## 2024-05-12 NOTE — Progress Notes (Signed)
 Occupational Therapy Treatment Patient Details Name: TABOR BARTRAM MRN: [id number] DOB: 1955/06/21 Today's Date: 05/12/2024   History of present illness Patient is a 68 year old female with LE edema, weight gain. Found to have acutely decompensated diastolic CHF exacerbation. PMH: lung cancer, CKD5, DM, HTN, PAD s/p R BKA, chronic systolic CHF   OT comments  Ms Daye was seen for OT treatment on this date. Upon arrival to room pt EOB with PT, overlap for safety, agreeable to tx. Pt requires MIN A simulated BSC t/f using RW step pivot and using squat pivot bed<>chair, +2 safety. SETUP seated grooming tasks. MOD A for periacess in standing. Limited standing tolerance. Pt making good progress toward goals, will continue to follow POC. Discharge recommendation remains appropriate.       If plan is discharge home, recommend the following:  A lot of help with walking and/or transfers;A little help with bathing/dressing/bathroom;Assist for transportation;Help with stairs or ramp for entrance;Assistance with cooking/housework   Equipment Recommendations  Other (comment) (defer)    Recommendations for Other Services      Precautions / Restrictions Precautions Precautions: Fall Recall of Precautions/Restrictions: Impaired Precaution/Restrictions Comments: old R BKA Restrictions Weight Bearing Restrictions Per Provider Order: No       Mobility Bed Mobility Overal bed mobility: Modified Independent                  Transfers Overall transfer level: Needs assistance Equipment used: Rolling Stork (2 wheels) Transfers: Sit to/from Stand Sit to Stand: Min assist, +2 physical assistance   Squat pivot transfers: Min assist, +2 safety/equipment             Balance Overall balance assessment: Needs assistance Sitting-balance support: Feet supported Sitting balance-Leahy Scale: Good     Standing balance support: Bilateral upper extremity supported, Reliant on assistive  device for balance Standing balance-Leahy Scale: Poor                             ADL either performed or assessed with clinical judgement   ADL Overall ADL's : Needs assistance/impaired                                       General ADL Comments: MIN A simulated BSC t/f using RW step pivot and using squat pivot, +2 safety. SETUP seated grooming tasks. MOD A for periacess in standing     Communication Communication Communication: No apparent difficulties   Cognition Arousal: Alert Behavior During Therapy: WFL for tasks assessed/performed Cognition: No apparent impairments                               Following commands: Intact        Cueing   Cueing Techniques: Verbal cues             Pertinent Vitals/ Pain       Pain Assessment Pain Assessment: No/denies pain   Frequency  Min 2X/week        Progress Toward Goals  OT Goals(current goals can now be found in the care plan section)  Progress towards OT goals: Progressing toward goals  Acute Rehab OT Goals OT Goal Formulation: With patient Time For Goal Achievement: 05/23/24 Potential to Achieve Goals: Good ADL Goals Pt Will Perform Grooming: with modified independence;sitting;standing Pt Will  Perform Lower Body Dressing: with modified independence;sitting/lateral leans;sit to/from stand Pt Will Transfer to Toilet: with modified independence Pt Will Perform Toileting - Clothing Manipulation and hygiene: with modified independence;sitting/lateral leans;sit to/from stand  Plan      Co-evaluation      Reason for Co-Treatment: To address functional/ADL transfers PT goals addressed during session: Mobility/safety with mobility OT goals addressed during session: ADL's and self-care      AM-PAC OT 6 Clicks Daily Activity     Outcome Measure   Help from another person eating meals?: None Help from another person taking care of personal grooming?: None Help from  another person toileting, which includes using toliet, bedpan, or urinal?: A Lot Help from another person bathing (including washing, rinsing, drying)?: A Lot Help from another person to put on and taking off regular upper body clothing?: A Little Help from another person to put on and taking off regular lower body clothing?: A Lot 6 Click Score: 17    End of Session Equipment Utilized During Treatment: Rolling [place]rick (2 wheels)  OT Visit Diagnosis: Other abnormalities of gait and mobility (R26.89)   Activity Tolerance Patient tolerated treatment well   Patient Left in bed;with call bell/phone within reach;with bed alarm set   Nurse Communication Mobility status        Time: 8594-8575 OT Time Calculation (min): 19 min  Charges: OT General Charges $OT Visit: 1 Visit OT Treatments $Self Care/Home Management : 8-22 mins  Elston Slot, M.S. OTR/L  05/12/24, 2:50 PM  ascom [phone]

## 2024-05-12 NOTE — Progress Notes (Signed)
 PROGRESS NOTE    [name] [name]  FMW:[id number] DOB: 02-27-1956 DOA: 05/07/2024 PCP: [name] Toribio SQUIBB, PA   Brief Narrative:   68 y.o. female with medical history significant of lung cancer, CKD5, DM, HTN, PAD s/p R BKA, chronic systolic Chf who presents to the ED with worseining LE edema. Pt reportedly ran out of diuretic for over one week. Pt resumed home torsemide  on 11/17, however LE remained markedly edematous, unable to fit prosthetic leg. Pt reports over 30lb wt gain in <1 week. Decreased urine output.   11/20: Cardio, Nephro, Palliative care, DM nurse c/s 11/21: PT, OT eval. DC tele, transfer to med-surg 11/22: Changed to DNR.  Stopped Lasix  and switch to oral metolazone  and torsemide  11/23: Waiting for her prosthesis to come from home that way she can check if it fits 11/24: PT and OT reeval for placement   Assessment & Plan:   Principal Problem:   CHF (congestive heart failure) (HCC) Active Problems:   PAD (peripheral artery disease)   Symptomatic anemia   Coronary artery disease of native artery of native heart with stable angina pectoris   Hypertensive heart and chronic kidney disease with heart failure and with stage 5 chronic kidney disease, or end stage renal disease (HCC)   Acutely decompensated diastolic CHF exacerbation - EF improved on echo July 2025 EF 50 to 55%  - Acute worsening likely as she ran out of her Diuretic/Torsemide  for 1 week and has not taken it - Cardio signed off - Stopped Lasix  and resumed home torsemide  80 mg daily - Continue Coreg  25 mg twice daily - No other GDMT given advanced kidney disease - Continue ASA 81 mg - Continue Lipitor  80 mg daily, Zetia  10 mg daily - Continue Isordil  40 mg 3 times daily   Anemia of CKD - s/p 1 PRBC transfusion for Hb 6.8-> 8.7   Acute on CKD 5 -Nephro following - baseline creat around 3.5 and on admission 4.23-> 3.93   DM -cont SSI -cont home regimen   HTN -BP stable -Cont home regimen    PAD -s/p RLE BKA, continue asa 81 mg - still waiting for he prosthesis from home  GOC Palliative care seen.  Now DNR  UDS + for cocaine on 05/07/2024   DVT prophylaxis: Heparin  heparin  injection 5,000 Units Start: 05/07/24 2230     Code Status: DNR Family Communication: None at bedside Disposition Plan: possible D/C Tomorrow if SNF available  Patient is medically stable   Consultants:  Nephro Cardio Palliative care    Subjective:  Still waiting for her prosthesis to come from home that way she can check if it fits, no new issues  Objective: Vitals:   05/12/24 0500 05/12/24 0505 05/12/24 0700 05/12/24 0853  BP:  (!) 129/55  (!) 159/73  Pulse:  70  67  Resp:  20  16  Temp:  98.5 F (36.9 C)  98.6 F (37 C)  TempSrc:      SpO2:  100%  100%  Weight: 53.1 kg  53.1 kg   Height:        Intake/Output Summary (Last 24 hours) at 05/12/2024 1736 Last data filed at 05/12/2024 1403 Gross per 24 hour  Intake 180 ml  Output 750 ml  Net -570 ml   Filed Weights   05/11/24 0509 05/12/24 0500 05/12/24 0700  Weight: 52.6 kg 53.1 kg 53.1 kg    Examination:  General exam: Appears calm and comfortable  Respiratory system: Clear to auscultation. Respiratory  effort normal. Cardiovascular system: S1 & S2 heard, RRR.  No pedal edema. Gastrointestinal system: Abdomen is soft, benign Central nervous system: Alert and oriented. No focal neurological deficits. Extremities: Rt BKA HD Access: Left AVF  Psychiatry: Judgement and insight appear normal. Mood & affect appropriate.     Data Reviewed: I have personally reviewed following labs and imaging studies  CBC: Recent Labs  Lab 05/08/24 0046 05/09/24 0330 05/10/24 0346 05/11/24 0342 05/12/24 0546  WBC 5.4 6.3 5.2 5.4 5.7  HGB 8.0* 8.1* 7.9* 8.0* 8.7*  HCT 25.8* 25.6* 25.4* 25.3* 28.0*  MCV 98.9 98.1 98.8 98.8 99.3  PLT 200 215 210 204 209   Basic Metabolic Panel: Recent Labs  Lab 05/08/24 0046 05/09/24 0330  05/10/24 0346 05/11/24 0342 05/12/24 0546 05/12/24 1320  NA 142 140 139  140 137 136  --   K 3.5 3.4* 4.5  4.6 4.7 5.5* 5.4*  CL 113* 109 106  106 104 103  --   CO2 18* 20* 23  24 25 27   --   GLUCOSE 220* 170* 188*  188* 238* 206*  --   BUN 61* 60* 62*  62* 72* 79*  --   CREATININE 4.00* 3.89* 3.96*  3.94* 4.00* 3.93*  --   CALCIUM  8.0* 8.1* 8.1*  8.0* 8.1* 8.2*  --   MG  --   --  2.2  --   --   --   PHOS  --   --  2.6  --   --   --    GFR: Estimated Creatinine Clearance: 11.8 mL/min (A) (by C-G formula based on SCr of 3.93 mg/dL (H)). Liver Function Tests: Recent Labs  Lab 05/07/24 1536 05/08/24 0046 05/10/24 0346  AST 24 19  --   ALT 17 16  --   ALKPHOS 118 112  --   BILITOT <0.2 0.2  --   PROT 5.2* 5.0*  --   ALBUMIN 3.0* 2.9* 2.7*   No results for input(s): LIPASE, AMYLASE in the last 168 hours. No results for input(s): AMMONIA in the last 168 hours. Coagulation Profile: Recent Labs  Lab 05/07/24 1536  INR 1.1   Cardiac Enzymes: No results for input(s): CKTOTAL, CKMB, CKMBINDEX, TROPONINI in the last 168 hours. BNP (last 3 results) Recent Labs    05/07/24 1536  PROBNP >35,000.0*   HbA1C: No results for input(s): HGBA1C in the last 72 hours. CBG: Recent Labs  Lab 05/11/24 1129 05/11/24 1615 05/11/24 2121 05/12/24 0851 05/12/24 1201  GLUCAP 176* 140* 156* 234* 259*    Anemia Panel: Recent Labs    05/12/24 0546  FERRITIN 309*  TIBC 237*  IRON  57     Radiology Studies: No results found.       Scheduled Meds:  aspirin   81 mg Oral Daily   atorvastatin   80 mg Oral Daily   carvedilol   25 mg Oral BID WC   DULoxetine   40 mg Oral BID   ezetimibe   10 mg Oral Daily   heparin  injection (subcutaneous)  5,000 Units Subcutaneous Q8H   hydrALAZINE   100 mg Oral Q8H   insulin  aspart  0-5 Units Subcutaneous QHS   insulin  aspart  0-6 Units Subcutaneous TID WC   insulin  aspart  4 Units Subcutaneous TID WC   insulin   glargine-yfgn  4 Units Subcutaneous QHS   isosorbide  dinitrate  40 mg Oral TID   metolazone   2.5 mg Oral Weekly   patiromer   16.8 g Oral STAT   torsemide   80 mg Oral Daily   Continuous Infusions:   LOS: 5 days    Time spent: 35 mins    [name] Demario Maree, MD [facility] Pager [phone]  If 7PM-7AM, please contact night-coverage [email or web address]  05/12/2024, 5:36 PM

## 2024-05-12 NOTE — Progress Notes (Addendum)
 Physical Therapy Treatment Patient Details Name: [name] [name] MRN: [id number] DOB: Sep 08, 1955 Today's Date: 05/12/2024   History of Present Illness Patient is a 68 year old female with LE edema, weight gain. Found to have acutely decompensated diastolic CHF exacerbation. PMH: lung cancer, CKD5, DM, HTN, PAD s/p R BKA, chronic systolic CHF    PT Comments  Pt in bed, ready for session.  Stated prosthetic has not been brought in by family to date.  Pt alluded that she was unable to get a hold of someone to bring it in.  Stated she has extensive DME at home.  Bed mobility with ease.  She is steady in sitting.  Stands to RW with verbal cues for hand placements and min a x 2 to transition to [place].  Once up she voices feeling unsteady and a bit fearful when asked to take small hops left/right and forward/back.  She stated she scoots into wheelchair if she does not have prosthetic on.  She is able to squat pivot with cga/min a x 2 to/from chair going right and left.  Remains sitting EOB to finish with OT.  Discussed discharge plan with pt.  She stated she feels she needs rehab before return home but would like to explore other living arrangements as she stated she is not happy in her current housing situation Too many people but pt has explored other options.  She does seem to be a bit weaker than baseline and would like to transition to rehab to max potential and safety before transition home.  <3 hrs a day therapy is appropriate.  Pt would benefit from prosthetic being brought for therapy sessions and general mobility as functional independence and confidence would likely be increased with proper equipment.  Co-tx session billed 1 unit each PT, and OT.    If plan is discharge home, recommend the following: A lot of help with walking and/or transfers;A little help with bathing/dressing/bathroom;Assist for transportation;Help with stairs or ramp for entrance   Can travel by private vehicle         Equipment Recommendations  None recommended by PT    Recommendations for Other Services       Precautions / Restrictions Precautions Precautions: Fall Recall of Precautions/Restrictions: Impaired Precaution/Restrictions Comments: old R BKA Restrictions Weight Bearing Restrictions Per Provider Order: No     Mobility  Bed Mobility Overal bed mobility: Modified Independent               Patient Response: Cooperative  Transfers Overall transfer level: Needs assistance Equipment used: Rolling Zillmer (2 wheels) Transfers: Sit to/from Stand Sit to Stand: Min assist, +2 physical assistance     Squat pivot transfers: Min assist, +2 safety/equipment     General transfer comment: squat pivot to drop arm recliner to simulate wheelchair.    Ambulation/Gait Ambulation/Gait assistance: Min assist, +2 physical assistance Gait Distance (Feet): 2 Feet Assistive device: Rolling Mcclain (2 wheels) Gait Pattern/deviations: Step-to pattern Gait velocity: dec     General Gait Details: hesitant to take any steps without prosthetic   Stairs             Wheelchair Mobility     Tilt Bed Tilt Bed Patient Response: Cooperative  Modified Rankin (Stroke Patients Only)       Balance Overall balance assessment: Needs assistance Sitting-balance support: Feet supported Sitting balance-Leahy Scale: Good     Standing balance support: Bilateral upper extremity supported, During functional activity, Reliant on assistive device for balance Standing balance-Leahy  Scale: Poor Standing balance comment: +2 external support for safety due to pt being nervous in standing                            Communication Communication Communication: No apparent difficulties  Cognition Arousal: Alert Behavior During Therapy: WFL for tasks assessed/performed   PT - Cognitive impairments: No apparent impairments                         Following commands: Intact       Cueing Cueing Techniques: Verbal cues  Exercises      General Comments        Pertinent Vitals/Pain Pain Assessment Pain Assessment: No/denies pain    Home Living                          Prior Function            PT Goals (current goals can now be found in the care plan section) Progress towards PT goals: Progressing toward goals    Frequency    Min 2X/week      PT Plan      Co-evaluation PT/OT/SLP Co-Evaluation/Treatment: Yes Reason for Co-Treatment: To address functional/ADL transfers PT goals addressed during session: Mobility/safety with mobility OT goals addressed during session: ADL's and self-care      AM-PAC PT 6 Clicks Mobility   Outcome Measure  Help needed turning from your back to your side while in a flat bed without using bedrails?: None Help needed moving from lying on your back to sitting on the side of a flat bed without using bedrails?: None Help needed moving to and from a bed to a chair (including a wheelchair)?: A Little Help needed standing up from a chair using your arms (e.g., wheelchair or bedside chair)?: A Little Help needed to walk in hospital room?: A Lot Help needed climbing 3-5 steps with a railing? : Total 6 Click Score: 17    End of Session Equipment Utilized During Treatment: Gait belt Activity Tolerance: Patient tolerated treatment well Patient left: in bed;with bed alarm set;with call bell/phone within reach Nurse Communication: Mobility status PT Visit Diagnosis: Muscle weakness (generalized) (M62.81);Unsteadiness on feet (R26.81)     Time: 8595-8581 PT Time Calculation (min) (ACUTE ONLY): 14 min  Charges:    $Therapeutic Activity: 8-22 mins PT General Charges $$ ACUTE PT VISIT: 1 Visit                   Lauraine Gills, PTA 05/12/24, 2:27 PM

## 2024-05-12 NOTE — TOC Progression Note (Signed)
 Transition of Care [facility]) - Progression Note    Patient Details  Name: [name] [name] MRN: [id number] Date of Birth: July 19, 1955  Transition of Care [facility]) CM/SW Contact  Alfonso Rummer, LCSW Phone Number: 05/12/2024, 12:31 PM  Clinical Narrative:     LCSW A. Rummer met with pt at bedside to provide snf offers. Pt reports she wants to transition to [facility]. Insurance Authorization requested. Pt reports she uses [facility] in [place].   Expected Discharge Plan: Home/Self Care Barriers to Discharge: Continued Medical Work up               Expected Discharge Plan and Services     Post Acute Care Choice: NA Living arrangements for the past 2 months: Single Family Home                                       Social Drivers of Health (SDOH) Interventions SDOH Screenings   Food Insecurity: No Food Insecurity (05/08/2024)  Housing: Low Risk  (05/08/2024)  Transportation Needs: No Transportation Needs (05/08/2024)  Utilities: Not At Risk (05/08/2024)  Depression (PHQ2-9): High Risk (02/06/2024)  Financial Resource Strain: Low Risk  (06/29/2023)   Received from [facility]  Social Connections: Moderately Integrated (05/08/2024)  Stress: Stress Concern Present (12/15/2021)  Tobacco Use: Medium Risk (05/07/2024)    Readmission Risk Interventions    05/08/2024    2:20 PM  Readmission Risk Prevention Plan  Transportation Screening Complete  Medication Review (RN Care Manager) Referral to Pharmacy  PCP or Specialist appointment within 3-5 days of discharge Complete  SW Recovery Care/Counseling Consult Complete  Palliative Care Screening Not Applicable  Skilled Nursing Facility Not Applicable

## 2024-05-12 NOTE — Progress Notes (Signed)
 [facility]  ROUNDING NOTE   Subjective:   [name] [name]  is a 68 y.o.  female  with medical history to include CAD, diabetes, cocaine abuse, depression. HTN, lung ca with LLL lobectomy and chronic kidney disease stage 4. Patient presents to the ED with lower extremity edema and has been admitted for CHF (congestive heart failure) (HCC) [I50.9] Symptomatic anemia [D64.9] Acute on chronic congestive heart failure, unspecified heart failure type [facility]) [I50.9]  Patient is known to our practice and follows with Dr Marcelino, was seen in office on 03/10/24.   Patient laying in bed Completed breakfast tray at bedside Room air Lower extremity edema has resolved   Objective:  Vital signs in last 24 hours:  Temp:  [97.9 F (36.6 C)-98.6 F (37 C)] 98.6 F (37 C) (11/24 0853) Pulse Rate:  [67-70] 67 (11/24 0853) Resp:  [16-20] 16 (11/24 0853) BP: (129-159)/(55-73) 159/73 (11/24 0853) SpO2:  [100 %] 100 % (11/24 0853) Weight:  [53.1 kg] 53.1 kg (11/24 0700)  Weight change: 0.5 kg Filed Weights   05/11/24 0509 05/12/24 0500 05/12/24 0700  Weight: 52.6 kg 53.1 kg 53.1 kg    Intake/Output: I/O last 3 completed shifts: In: 420 [P.O.:420] Out: 1400 [Urine:1400]   Intake/Output this shift:  No intake/output data recorded.  Physical Exam: General: NAD  Head: Normocephalic, atraumatic  Eyes: Anicteric  Lungs:  Clear to auscultation, normal effort. Room air  Heart: Regular rate and rhythm  Abdomen:  Soft, nontender  Extremities: No peripheral edema.  Right AKA  Neurologic: Awake, alert, conversant  Skin: Warm,dry, no rash  Access: Left AVF    Basic Metabolic Panel: Recent Labs  Lab 05/08/24 0046 05/09/24 0330 05/10/24 0346 05/11/24 0342 05/12/24 0546  NA 142 140 139  140 137 136  K 3.5 3.4* 4.5  4.6 4.7 5.5*  CL 113* 109 106  106 104 103  CO2 18* 20* 23  24 25 27   GLUCOSE 220* 170* 188*  188* 238* 206*  BUN 61* 60* 62*  62* 72* 79*  CREATININE 4.00*  3.89* 3.96*  3.94* 4.00* 3.93*  CALCIUM  8.0* 8.1* 8.1*  8.0* 8.1* 8.2*  MG  --   --  2.2  --   --   PHOS  --   --  2.6  --   --     Liver Function Tests: Recent Labs  Lab 05/07/24 1536 05/08/24 0046 05/10/24 0346  AST 24 19  --   ALT 17 16  --   ALKPHOS 118 112  --   BILITOT <0.2 0.2  --   PROT 5.2* 5.0*  --   ALBUMIN 3.0* 2.9* 2.7*   No results for input(s): LIPASE, AMYLASE in the last 168 hours. No results for input(s): AMMONIA in the last 168 hours.  CBC: Recent Labs  Lab 05/08/24 0046 05/09/24 0330 05/10/24 0346 05/11/24 0342 05/12/24 0546  WBC 5.4 6.3 5.2 5.4 5.7  HGB 8.0* 8.1* 7.9* 8.0* 8.7*  HCT 25.8* 25.6* 25.4* 25.3* 28.0*  MCV 98.9 98.1 98.8 98.8 99.3  PLT 200 215 210 204 209    Cardiac Enzymes: No results for input(s): CKTOTAL, CKMB, CKMBINDEX, TROPONINI in the last 168 hours.  BNP: Invalid input(s): POCBNP  CBG: Recent Labs  Lab 05/11/24 1129 05/11/24 1615 05/11/24 2121 05/12/24 0851 05/12/24 1201  GLUCAP 176* 140* 156* 234* 259*    Microbiology: Results for orders placed or performed during the hospital encounter of 01/29/24  C Difficile Quick Screen w PCR  reflex     Status: None   Collection Time: 01/31/24  1:38 PM   Specimen: STOOL  Result Value Ref Range Status   C Diff antigen NEGATIVE NEGATIVE Final   C Diff toxin NEGATIVE NEGATIVE Final   C Diff interpretation No C. difficile detected.  Final    Comment: Performed at [facility], [address] [place], [place] [address]    Coagulation Studies: No results for input(s): LABPROT, INR in the last 72 hours.   Urinalysis: No results for input(s): COLORURINE, LABSPEC, PHURINE, GLUCOSEU, HGBUR, BILIRUBINUR, KETONESUR, PROTEINUR, UROBILINOGEN, NITRITE, LEUKOCYTESUR in the last 72 hours.  Invalid input(s): APPERANCEUR     Imaging: No results found.    Medications:     aspirin   81 mg Oral Daily   atorvastatin   80 mg  Oral Daily   carvedilol   25 mg Oral BID WC   DULoxetine   40 mg Oral BID   ezetimibe   10 mg Oral Daily   heparin  injection (subcutaneous)  5,000 Units Subcutaneous Q8H   hydrALAZINE   100 mg Oral Q8H   insulin  aspart  0-5 Units Subcutaneous QHS   insulin  aspart  0-6 Units Subcutaneous TID WC   insulin  aspart  4 Units Subcutaneous TID WC   insulin  glargine-yfgn  4 Units Subcutaneous QHS   isosorbide  dinitrate  40 mg Oral TID   metolazone   2.5 mg Oral Weekly   torsemide   80 mg Oral Daily   acetaminophen  **OR** acetaminophen   Assessment/ Plan:  [name] [name] is a 68 y.o.  female  with medical history to include CAD, diabetes, cocaine abuse, depression. HTN, lung ca with LLL lobectomy and chronic kidney disease stage 4.  Acute Kidney Injury on chronic kidney disease stage 5 with baseline creatinine 3.48 and GFR of 14 on 03/10/2024.  Acute kidney injury secondary to volume overload, likely cardiorenal component.  Prescribed outpatient diuretics, reports compliance.  Has AVF in place, bruit and thrill present.    Creatinine has remained stable Lab Results  Component Value Date   CREATININE 3.93 (H) 05/12/2024   CREATININE 4.00 (H) 05/11/2024   CREATININE 3.96 (H) 05/10/2024   CREATININE 3.94 (H) 05/10/2024    Intake/Output Summary (Last 24 hours) at 05/12/2024 1224 Last data filed at 05/12/2024 0700 Gross per 24 hour  Intake 420 ml  Output 400 ml  Net 20 ml   2.  Acute on chronic diastolic heart failure.  Last echo on 12/2023 shows EF 50 to 55% with a grade 2 diastolic dysfunction with mitral regurgitation.  Chest x-ray shows mild left basilar atelectasis versus minimal left small pleural effusion.  Prescribed torsemide  80 mg daily outpatient.    Continue daily torsemide  and weekly metolazone .   3. Anemia of chronic kidney disease Lab Results  Component Value Date   HGB 8.7 (L) 05/12/2024    Hemoglobin slightly dropped.  Received low dose ESA during this admission.  4.   Hypertension with chronic kidney disease.  Currently receiving hydralazine , isosorbide , and carvedilol .  Also prescribed torsemide  as above.  Blood pressure acceptable for this patient.     LOS: 5 [name] [name] 11/24/202512:24 PM

## 2024-05-13 ENCOUNTER — Telehealth: Payer: Self-pay | Admitting: Oncology

## 2024-05-13 DIAGNOSIS — I25118 Atherosclerotic heart disease of native coronary artery with other forms of angina pectoris: Secondary | ICD-10-CM | POA: Diagnosis not present

## 2024-05-13 DIAGNOSIS — I5032 Chronic diastolic (congestive) heart failure: Secondary | ICD-10-CM | POA: Diagnosis not present

## 2024-05-13 DIAGNOSIS — I132 Hypertensive heart and chronic kidney disease with heart failure and with stage 5 chronic kidney disease, or end stage renal disease: Secondary | ICD-10-CM | POA: Diagnosis not present

## 2024-05-13 DIAGNOSIS — I739 Peripheral vascular disease, unspecified: Secondary | ICD-10-CM | POA: Diagnosis not present

## 2024-05-13 LAB — BASIC METABOLIC PANEL WITH GFR
Anion gap: 9 (ref 5–15)
BUN: 78 mg/dL — ABNORMAL HIGH (ref 8–23)
CO2: 26 mmol/L (ref 22–32)
Calcium: 8.5 mg/dL — ABNORMAL LOW (ref 8.9–10.3)
Chloride: 99 mmol/L (ref 98–111)
Creatinine, Ser: 4.08 mg/dL — ABNORMAL HIGH (ref 0.44–1.00)
GFR, Estimated: 11 mL/min — ABNORMAL LOW (ref >60)
Glucose, Bld: 195 mg/dL — ABNORMAL HIGH (ref 70–99)
Potassium: 5.3 mmol/L — ABNORMAL HIGH (ref 3.5–5.1)
Sodium: 134 mmol/L — ABNORMAL LOW (ref 135–145)

## 2024-05-13 LAB — CBC
HCT: 27.2 % — ABNORMAL LOW (ref 36.0–46.0)
Hemoglobin: 8.4 g/dL — ABNORMAL LOW (ref 12.0–15.0)
MCH: 30.8 pg (ref 26.0–34.0)
MCHC: 30.9 g/dL (ref 30.0–36.0)
MCV: 99.6 fL (ref 80.0–100.0)
Platelets: 214 K/uL (ref 150–400)
RBC: 2.73 MIL/uL — ABNORMAL LOW (ref 3.87–5.11)
RDW: 17.9 % — ABNORMAL HIGH (ref 11.5–15.5)
WBC: 5.6 K/uL (ref 4.0–10.5)
nRBC: 0 % (ref 0.0–0.2)

## 2024-05-13 LAB — GLUCOSE, CAPILLARY
Glucose-Capillary: 196 mg/dL — ABNORMAL HIGH (ref 70–99)
Glucose-Capillary: 183 mg/dL — ABNORMAL HIGH (ref 70–99)
Glucose-Capillary: 156 mg/dL — ABNORMAL HIGH (ref 70–99)

## 2024-05-13 MED ORDER — ISOSORBIDE DINITRATE 40 MG PO TABS: 40.0000 mg | ORAL_TABLET | Freq: Three times a day (TID) | ORAL | Status: AC

## 2024-05-13 MED ORDER — DULOXETINE HCL 20 MG PO CPEP: 40.0000 mg | ORAL_CAPSULE | Freq: Every day | ORAL | Status: DC

## 2024-05-13 MED ORDER — TORSEMIDE 40 MG PO TABS: 80.0000 mg | ORAL_TABLET | Freq: Every day | ORAL | Status: AC

## 2024-05-13 MED ORDER — METOLAZONE 2.5 MG PO TABS: 2.5000 mg | ORAL_TABLET | ORAL | Status: AC

## 2024-05-13 MED ORDER — EZETIMIBE 10 MG PO TABS: 10.0000 mg | ORAL_TABLET | Freq: Every day | ORAL | Status: AC

## 2024-05-13 NOTE — Inpatient Diabetes Management (Signed)
 Inpatient Diabetes Program Recommendations  AACE/ADA: New Consensus Statement on Inpatient Glycemic Control   Target Ranges:  Prepandial:   less than 140 mg/dL      Peak postprandial:   less than 180 mg/dL (1-2 hours)      Critically ill patients:  140 - 180 mg/dL    Latest Reference Range & Units 05/12/24 08:51 05/12/24 12:01 05/12/24 18:32 05/12/24 21:17 05/13/24 07:54  Glucose-Capillary 70 - 99 mg/dL 765 (H) 740 (H) 720 (H) 379 (H) 196 (H)  (H): Data is abnormally high  Review of Glycemic Control  Diabetes history: DM2 Outpatient Diabetes medications: Lantus  4 units at bedtime, Humalog  4 units TID with meals plus correction, Dexcom G7 CGM Current orders for Inpatient glycemic control: Semglee  4 units at bedtime, Novolog  4 units TID with meals, Novolog  0-6 units TID with meals, Novolog  0-5 units QHS  Inpatient Diabetes Program Recommendations:    Insulin : Please consider increasing Semglee  to 5 units at bedtime and meal coverage to Novolog  6 units TID with meals.  Thanks, Earnie Gainer, RN, MSN, CDCES Diabetes Coordinator Inpatient Diabetes Program [phone] (Team Pager from 8am to 5pm)

## 2024-05-13 NOTE — Discharge Summary (Signed)
 Physician Discharge Summary   Patient: [name] [name] MRN: [id number] DOB: Nov 30, 1955  Admit date:     05/07/2024  Discharge date: 05/13/24  Discharge Physician: Cresencio Fairly   PCP: Manya Toribio SQUIBB, PA   Recommendations at discharge:   Follow-up with outpatient providers as requested  Discharge Diagnoses: Principal Problem:   CHF (congestive heart failure) (HCC) Active Problems:   PAD (peripheral artery disease)   Symptomatic anemia   Coronary artery disease of native artery of native heart with stable angina pectoris   Hypertensive heart and chronic kidney disease with heart failure and with stage 5 chronic kidney disease, or end stage renal disease [facility])  Hospital Course: Assessment and Plan:  68 y.o. female with medical history significant of lung cancer, CKD5, DM, HTN, PAD s/p R BKA, chronic systolic Chf who presents to the ED with worseining LE edema. Pt reportedly ran out of diuretic for over one week. Pt resumed home torsemide  on 11/17, however LE remained markedly edematous, unable to fit prosthetic leg. Pt reports over 30lb wt gain in <1 week. Decreased urine output.    11/20: Cardio, Nephro, Palliative care, DM nurse c/s 11/21: PT, OT eval. DC tele, transfer to med-surg 11/22: Changed to DNR.  Stopped Lasix  and switch to oral metolazone  and torsemide  11/23: Waiting for her prosthesis to come from home that way she can check if it fits 11/24: PT and OT reeval for placement 11/25 -going to [facility]   Acutely decompensated diastolic CHF exacerbation - EF improved on echo July 2025 EF 50 to 55%  - Acute worsening likely as she ran out of her Diuretic/Torsemide  for 1 week and has not taken it - Cardio signed off - Stopped Lasix  and resumed home torsemide  80 mg daily - Continue Coreg  25 mg twice daily - No other GDMT given advanced kidney disease - Continue ASA 81 mg - Continue Lipitor  80 mg daily, Zetia  10 mg daily - Continue Isordil  40 mg 3 times daily    Anemia of CKD - s/p 1 PRBC transfusion for Hb 6.8-> 8.7   Acute on CKD 5 -Nephro following - Stable creatinine   DM HTN PAD -s/p RLE BKA, continue asa 81 mg - still waiting for he prosthesis from home   GOC Palliative care seen.  Now DNR   UDS + for cocaine on 05/07/2024       Consultants: Palliative care, cardiology Disposition: Skilled nursing facility Diet recommendation:  Discharge Diet Orders (From admission, onward)     Start     Ordered   05/13/24 0000  Diet - low sodium heart healthy        05/13/24 1251           Carb modified diet DISCHARGE MEDICATION: Allergies as of 05/13/2024       Reactions   Lisinopril Other (See Comments), Swelling   Recurrent AKI and hyperkalemia when initiation attempted. Other Reaction(s): Facial edema Other Reaction(s): Facial edema, hyperkalemia, Other (See Comments)    Recurrent AKI and hyperkalemia when initiation attempted. Other Reaction(s): Facial edema    Recurrent AKI and hyperkalemia when initiation attempted.  Other Reaction(s): Facial edema, hyperkalemia  Recurrent AKI and hyperkalemia when initiation attempted.  Other Reaction(s): Facial edema, hyperkalemia  Recurrent AKI and hyperkalemia when initiation attempted.  Other Reaction(s): Facial edema, hyperkalemia   Cyanoacrylate Dermatitis        Medication List     STOP taking these medications    ketoconazole  2 % cream Commonly known as: NIZORAL   traMADol  50 MG tablet Commonly known as: Ultram    Vitamin D (Ergocalciferol) 1.25 MG (50000 UNIT) Caps capsule Commonly known as: DRISDOL       TAKE these medications    Accu-Chek Guide Test test strip Generic drug: glucose blood 3 (three) times daily.   aspirin  EC 81 MG tablet Take 81 mg by mouth in the morning. Swallow whole.   atorvastatin  80 MG tablet Commonly known as: LIPITOR  Take 1 tablet (80 mg total) by mouth daily.   calcitRIOL 0.25 MCG capsule Commonly known as: ROCALTROL Take  0.25 mcg by mouth daily.   calcium  acetate 667 MG capsule Commonly known as: PHOSLO Take 667 mg by mouth.   carboxymethylcellulose 0.5 % Soln Commonly known as: REFRESH PLUS Place 1 drop into both eyes in the morning and at bedtime.   CareFine Pen Needles 32G X 4 MM Misc Generic drug: Insulin  Pen Needle CAREFINE PEN NEEDLES 32G X 4 MM   carvedilol  25 MG tablet Commonly known as: COREG  Take 1 tablet (25 mg total) by mouth 2 (two) times daily.   Dexcom G7 Receiver Devi Use as instructed with G7 sensors   Dexcom G7 Sensor Misc Place one sensor on the skin every 10 days for use with Dexcom reader.   DULoxetine  20 MG capsule Commonly known as: CYMBALTA  Take 2 capsules (40 mg total) by mouth daily. What changed: when to take this   ezetimibe  10 MG tablet Commonly known as: ZETIA  Take 1 tablet (10 mg total) by mouth daily. Start taking on: May 14, 2024   Glucagon 3 MG/DOSE Powd Use 1 spray in 1 nostril for severe hypoglycemia, as per package instructions   hydrALAZINE  100 MG tablet Commonly known as: APRESOLINE  Take 1 tablet (100 mg total) by mouth 3 (three) times daily.   insulin  lispro 100 UNIT/ML injection Commonly known as: HUMALOG  Inject 4 Units into the skin 3 (three) times daily with meals. plus sliding scale before meals Insulin  dose 70-150 0 units 151-200 1 unit, 201-250 2 units, 251-300 3 units, 301-350 4 units, 351+5 units   isosorbide  dinitrate 40 MG tablet Commonly known as: ISORDIL  Take 1 tablet (40 mg total) by mouth 3 (three) times daily. What changed:  medication strength how much to take   Lantus  SoloStar 100 UNIT/ML Solostar Pen Generic drug: insulin  glargine Inject 4 Units into the skin at bedtime.   metolazone  2.5 MG tablet Commonly known as: ZAROXOLYN  Take 1 tablet (2.5 mg total) by mouth once a week. Start taking on: May 18, 2024   Torsemide  40 MG Tabs Take 80 mg by mouth daily. Start taking on: May 14, 2024 What changed:   medication strength additional instructions        Contact information for after-discharge care     Destination     [facility] [place], [place]. [place]   Service: Skilled Nursing Contact information: [address] [place]  [address] [phone]                    Discharge Exam: Filed Weights   05/12/24 0500 05/12/24 0700 05/13/24 0500  Weight: 53.1 kg 53.1 kg 53 kg   General exam: Appears calm and comfortable  Respiratory system: Clear to auscultation. Respiratory effort normal. Cardiovascular system: S1 & S2 heard, RRR.  No pedal edema. Gastrointestinal system: Abdomen is soft, benign Central nervous system: Alert and oriented. No focal neurological deficits. Extremities: Rt BKA HD Access: Left AVF  Psychiatry: Judgement and insight appear normal. Mood & affect  appropriate.   Condition at discharge: fair  The results of significant diagnostics from this hospitalization (including imaging, microbiology, ancillary and laboratory) are listed below for reference.   Imaging Studies: DG Chest Port 1 View Result Date: 05/07/2024 EXAM: 1 VIEW(S) XRAY OF THE CHEST 05/07/2024 03:32:00 PM COMPARISON: 01/29/2024 CLINICAL HISTORY: SOB (shortness of breath) FINDINGS: LINES, TUBES AND DEVICES: Stable right internal jugular Port-A-Cath. LUNGS AND PLEURA: Elevated left hemidiaphragm. Mild left basilar atelectasis or scarring is noted. Minimal left pleural effusion. No pneumothorax. HEART AND MEDIASTINUM: No acute abnormality of the cardiac and mediastinal silhouettes. BONES AND SOFT TISSUES: No acute osseous abnormality. IMPRESSION: 1. Mild left basilar atelectasis or scarring with minimal left pleural effusion. 2. Elevated left hemidiaphragm. Electronically signed by: Lynwood Seip MD 05/07/2024 03:46 PM EST RP Workstation: [id number]    Microbiology: Results for orders placed or performed during the hospital encounter of 01/29/24  C Difficile Quick Screen w PCR  reflex     Status: None   Collection Time: 01/31/24  1:38 PM   Specimen: STOOL  Result Value Ref Range Status   C Diff antigen NEGATIVE NEGATIVE Final   C Diff toxin NEGATIVE NEGATIVE Final   C Diff interpretation No C. difficile detected.  Final    Comment: Performed at [facility], [address] [place], [place] [address]    Labs: CBC: Recent Labs  Lab 05/09/24 0330 05/10/24 0346 05/11/24 0342 05/12/24 0546 05/13/24 0631  WBC 6.3 5.2 5.4 5.7 5.6  HGB 8.1* 7.9* 8.0* 8.7* 8.4*  HCT 25.6* 25.4* 25.3* 28.0* 27.2*  MCV 98.1 98.8 98.8 99.3 99.6  PLT 215 210 204 209 214   Basic Metabolic Panel: Recent Labs  Lab 05/09/24 0330 05/10/24 0346 05/11/24 0342 05/12/24 0546 05/12/24 1320 05/12/24 1736 05/13/24 0631  NA 140 139  140 137 136  --   --  134*  K 3.4* 4.5  4.6 4.7 5.5* 5.4* 5.6* 5.3*  CL 109 106  106 104 103  --   --  99  CO2 20* 23  24 25 27   --   --  26  GLUCOSE 170* 188*  188* 238* 206*  --   --  195*  BUN 60* 62*  62* 72* 79*  --   --  78*  CREATININE 3.89* 3.96*  3.94* 4.00* 3.93*  --   --  4.08*  CALCIUM  8.1* 8.1*  8.0* 8.1* 8.2*  --   --  8.5*  MG  --  2.2  --   --   --   --   --   PHOS  --  2.6  --   --   --   --   --    Liver Function Tests: Recent Labs  Lab 05/07/24 1536 05/08/24 0046 05/10/24 0346  AST 24 19  --   ALT 17 16  --   ALKPHOS 118 112  --   BILITOT <0.2 0.2  --   PROT 5.2* 5.0*  --   ALBUMIN 3.0* 2.9* 2.7*   CBG: Recent Labs  Lab 05/12/24 1201 05/12/24 1832 05/12/24 2117 05/13/24 0754 05/13/24 1133  GLUCAP 259* 279* 379* 196* 183*    Discharge time spent: greater than 30 minutes.  Signed: Cresencio Fairly, MD [facility] 05/13/2024

## 2024-05-13 NOTE — Telephone Encounter (Signed)
 [name] [name] from [facility] called to let us  know that pt is d/c today from hospital and will need follow up in 2 weeks with Dr Babara - [facility]

## 2024-05-13 NOTE — Progress Notes (Signed)
 [facility]  ROUNDING NOTE   Subjective:   [name] [name]  is a 68 y.o.  female  with medical history to include CAD, diabetes, cocaine abuse, depression. HTN, lung ca with LLL lobectomy and chronic kidney disease stage 4. Patient presents to the ED with lower extremity edema and has been admitted for CHF (congestive heart failure) (HCC) [I50.9] Symptomatic anemia [D64.9] Acute on chronic congestive heart failure, unspecified heart failure type [facility]) [I50.9]  Patient is known to our practice and follows with Dr Marcelino, was seen in office on 03/10/24.   Patient sitting up in bed Remains on room air Tolerating meals   Objective:  Vital signs in last 24 hours:  Temp:  [98 F (36.7 C)-98.6 F (37 C)] 98 F (36.7 C) (11/25 0755) Pulse Rate:  [68-78] 70 (11/25 0755) Resp:  [16-18] 18 (11/25 0755) BP: (126-151)/(56-62) 151/62 (11/25 0755) SpO2:  [100 %] 100 % (11/25 0755) Weight:  [53 kg] 53 kg (11/25 0500)  Weight change: -0.1 kg Filed Weights   05/12/24 0500 05/12/24 0700 05/13/24 0500  Weight: 53.1 kg 53.1 kg 53 kg    Intake/Output: I/O last 3 completed shifts: In: -  Out: 750 [Urine:750]   Intake/Output this shift:  No intake/output data recorded.  Physical Exam: General: NAD  Head: Normocephalic, atraumatic  Eyes: Anicteric  Lungs:  Clear to auscultation, normal effort. Room air  Heart: Regular rate and rhythm  Abdomen:  Soft, nontender  Extremities: No peripheral edema.  Right AKA  Neurologic: Awake, alert, conversant  Skin: Warm,dry, no rash  Access: Left AVF    Basic Metabolic Panel: Recent Labs  Lab 05/09/24 0330 05/10/24 0346 05/11/24 0342 05/12/24 0546 05/12/24 1320 05/12/24 1736 05/13/24 0631  NA 140 139  140 137 136  --   --  134*  K 3.4* 4.5  4.6 4.7 5.5* 5.4* 5.6* 5.3*  CL 109 106  106 104 103  --   --  99  CO2 20* 23  24 25 27   --   --  26  GLUCOSE 170* 188*  188* 238* 206*  --   --  195*  BUN 60* 62*  62* 72* 79*  --    --  78*  CREATININE 3.89* 3.96*  3.94* 4.00* 3.93*  --   --  4.08*  CALCIUM  8.1* 8.1*  8.0* 8.1* 8.2*  --   --  8.5*  MG  --  2.2  --   --   --   --   --   PHOS  --  2.6  --   --   --   --   --     Liver Function Tests: Recent Labs  Lab 05/07/24 1536 05/08/24 0046 05/10/24 0346  AST 24 19  --   ALT 17 16  --   ALKPHOS 118 112  --   BILITOT <0.2 0.2  --   PROT 5.2* 5.0*  --   ALBUMIN 3.0* 2.9* 2.7*   No results for input(s): LIPASE, AMYLASE in the last 168 hours. No results for input(s): AMMONIA in the last 168 hours.  CBC: Recent Labs  Lab 05/09/24 0330 05/10/24 0346 05/11/24 0342 05/12/24 0546 05/13/24 0631  WBC 6.3 5.2 5.4 5.7 5.6  HGB 8.1* 7.9* 8.0* 8.7* 8.4*  HCT 25.6* 25.4* 25.3* 28.0* 27.2*  MCV 98.1 98.8 98.8 99.3 99.6  PLT 215 210 204 209 214    Cardiac Enzymes: No results for input(s): CKTOTAL, CKMB, CKMBINDEX, TROPONINI in  the last 168 hours.  BNP: Invalid input(s): POCBNP  CBG: Recent Labs  Lab 05/12/24 1201 05/12/24 1832 05/12/24 2117 05/13/24 0754 05/13/24 1133  GLUCAP 259* 279* 379* 196* 183*    Microbiology: Results for orders placed or performed during the hospital encounter of 01/29/24  C Difficile Quick Screen w PCR reflex     Status: None   Collection Time: 01/31/24  1:38 PM   Specimen: STOOL  Result Value Ref Range Status   C Diff antigen NEGATIVE NEGATIVE Final   C Diff toxin NEGATIVE NEGATIVE Final   C Diff interpretation No C. difficile detected.  Final    Comment: Performed at [facility], [address] [place], [place] [address]    Coagulation Studies: No results for input(s): LABPROT, INR in the last 72 hours.   Urinalysis: No results for input(s): COLORURINE, LABSPEC, PHURINE, GLUCOSEU, HGBUR, BILIRUBINUR, KETONESUR, PROTEINUR, UROBILINOGEN, NITRITE, LEUKOCYTESUR in the last 72 hours.  Invalid input(s): APPERANCEUR     Imaging: No results  found.    Medications:     aspirin   81 mg Oral Daily   atorvastatin   80 mg Oral Daily   carvedilol   25 mg Oral BID WC   DULoxetine   40 mg Oral BID   ezetimibe   10 mg Oral Daily   heparin  injection (subcutaneous)  5,000 Units Subcutaneous Q8H   hydrALAZINE   100 mg Oral Q8H   insulin  aspart  0-5 Units Subcutaneous QHS   insulin  aspart  0-6 Units Subcutaneous TID WC   insulin  aspart  4 Units Subcutaneous TID WC   insulin  glargine-yfgn  4 Units Subcutaneous QHS   isosorbide  dinitrate  40 mg Oral TID   metolazone   2.5 mg Oral Weekly   torsemide   80 mg Oral Daily   acetaminophen  **OR** acetaminophen   Assessment/ Plan:  [name] is a 68 y.o.  female  with medical history to include CAD, diabetes, cocaine abuse, depression. HTN, lung ca with LLL lobectomy and chronic kidney disease stage 4.  Acute Kidney Injury on chronic kidney disease stage 5 with baseline creatinine 3.48 and GFR of 14 on 03/10/2024.  Acute kidney injury secondary to volume overload, likely cardiorenal component.  Prescribed outpatient diuretics, reports compliance.  Has AVF in place, bruit and thrill present.    Creatinine stable, will continue to monitor outpatient  Lab Results  Component Value Date   CREATININE 4.08 (H) 05/13/2024   CREATININE 3.93 (H) 05/12/2024   CREATININE 4.00 (H) 05/11/2024    Intake/Output Summary (Last 24 hours) at 05/13/2024 1213 Last data filed at 05/12/2024 1403 Gross per 24 hour  Intake --  Output 350 ml  Net -350 ml   2.  Acute on chronic diastolic heart failure.  Last echo on 12/2023 shows EF 50 to 55% with a grade 2 diastolic dysfunction with mitral regurgitation.  Chest x-ray shows mild left basilar atelectasis versus minimal left small pleural effusion.  Prescribed torsemide  80 mg daily outpatient.    Continue daily torsemide  and weekly metolazone .Volume status stable   3. Anemia of chronic kidney disease Lab Results  Component Value Date   HGB 8.4 (L)  05/13/2024    Hemoglobin 8.4. Received low dose ESA during this admission. Will continue to assess need outpatient.   4.  Hypertension with chronic kidney disease.  Currently receiving hydralazine , isosorbide , and carvedilol .  Also prescribed torsemide  as above.  Blood pressure stable    LOS: 6 Taccara Bushnell 11/25/202512:13 PM

## 2024-05-13 NOTE — Plan of Care (Signed)
  Problem: Education: Goal: Knowledge of General Education information will improve Description: Including pain rating scale, medication(s)/side effects and non-pharmacologic comfort measures Outcome: Progressing   Problem: Health Behavior/Discharge Planning: Goal: Ability to manage health-related needs will improve Outcome: Progressing   Problem: Clinical Measurements: Goal: Ability to maintain clinical measurements within normal limits will improve Outcome: Progressing Goal: Will remain free from infection Outcome: Progressing Goal: Diagnostic test results will improve Outcome: Progressing Goal: Respiratory complications will improve Outcome: Progressing Goal: Cardiovascular complication will be avoided Outcome: Progressing   Problem: Coping: Goal: Level of anxiety will decrease Outcome: Progressing   Problem: Nutrition: Goal: Adequate nutrition will be maintained Outcome: Progressing   Problem: Pain Managment: Goal: General experience of comfort will improve and/or be controlled Outcome: Progressing   Problem: Education: Goal: Ability to describe self-care measures that may prevent or decrease complications (Diabetes Survival Skills Education) will improve Outcome: Progressing Goal: Individualized Educational Video(s) Outcome: Progressing   Problem: Skin Integrity: Goal: Risk for impaired skin integrity will decrease Outcome: Progressing   Problem: Health Behavior/Discharge Planning: Goal: Ability to identify and utilize available resources and services will improve Outcome: Progressing Goal: Ability to manage health-related needs will improve Outcome: Progressing   Problem: Fluid Volume: Goal: Ability to maintain a balanced intake and output will improve Outcome: Progressing   Problem: Tissue Perfusion: Goal: Adequacy of tissue perfusion will improve Outcome: Progressing   Problem: Skin Integrity: Goal: Risk for impaired skin integrity will  decrease Outcome: Progressing   Problem: Tissue Perfusion: Goal: Adequacy of tissue perfusion will improve Outcome: Progressing

## 2024-05-13 NOTE — Progress Notes (Signed)
 Patient alert oriented breathing regular unlabored. Discharged via EMS transporting [facility] to [facility]. Report was called by [facility] to [facility]

## 2024-05-13 NOTE — TOC Transition Note (Signed)
 Transition of Care [facility]) - Discharge Note   Patient Details  Name: [name] [name] MRN: [id number] Date of Birth: [birth year]  Transition of Care [facility]) CM/SW Contact:  Alfonso Rummer, LCSW Phone Number: 05/13/2024, 1:24 PM   Clinical Narrative:    Pt will transition to [facility] via [facility]. LCSW A Rummer unable to reach family to advise of discharge messages left in voicemail Burnard Simmering Daughter Emergency Contact [phone] Claretta Sharps Relative Emergency Contact [phone] Penne Sar Son Emergency Contact [phone] Nathen Sar Son Emergency Contact [phone]     Final next level of care: Skilled Nursing Facility Barriers to Discharge: Barriers Resolved   Patient Goals and CMS Choice     Choice offered to / list presented to : Patient      Discharge Placement PASRR number recieved: 05/13/24            Patient chooses bed at: [facility] [place] Patient to be transferred to facility by: [facility] Name of family member notified: Burnard Simmering  Daughter  Emergency Contact  [phone]  Claretta Sharps  Relative  Emergency Contact  [phone]  Penne Sar  Son  Emergency Contact  [phone]  Nathen Sar  Son  Emergency Contact  [phone]  LCSW called numbers on file and left message requesting callback Patient and family notified of of transfer: 05/13/24  Discharge Plan and Services Additional resources added to the After Visit Summary for       Post Acute Care Choice: NA                               Social Drivers of Health (SDOH) Interventions SDOH Screenings   Food Insecurity: No Food Insecurity (05/08/2024)  Housing: Low Risk  (05/08/2024)  Transportation Needs: No Transportation Needs (05/08/2024)  Utilities: Not At Risk (05/08/2024)  Depression (PHQ2-9): High Risk (02/06/2024)  Financial Resource Strain: Low Risk  (06/29/2023)   Received from [facility]  Social Connections: Moderately  Integrated (05/08/2024)  Stress: Stress Concern Present (12/15/2021)  Tobacco Use: Medium Risk (05/07/2024)     Readmission Risk Interventions    05/08/2024    2:20 PM  Readmission Risk Prevention Plan  Transportation Screening Complete  Medication Review (RN Care Manager) Referral to Pharmacy  PCP or Specialist appointment within 3-5 days of discharge Complete  SW Recovery Care/Counseling Consult Complete  Palliative Care Screening Not Applicable  Skilled Nursing Facility Not Applicable

## 2024-05-13 NOTE — Progress Notes (Signed)
 Report called to [facility] at [facility]. [facility] to transport patient today.

## 2024-05-14 NOTE — Telephone Encounter (Signed)
 [name] [name] called requesting to have new prescriptions submitted to [facility], please advise.   calcium  acetate (PHOSLO) 667 MG capsule isosorbide  dinitrate (ISORDIL ) 40 MG tablet ketoconazole  (NIZORAL ) 2 % cream  torsemide  (DEMADEX ) 20 MG tablet  Says they will wait for the patient to schedule visit. Just wanted to see if new prescriptions could be sent.

## 2024-05-20 ENCOUNTER — Telehealth: Payer: Self-pay | Admitting: Family

## 2024-05-20 NOTE — Progress Notes (Deleted)
 Advanced Heart Failure Clinic Note   Referring Physician: 08/25 admission PCP: Manya Toribio SQUIBB, PA Cardiologist: [facility] cardiology (seen 06/25; returns 09/25)  Chief Complaint: shortness of breath   HPI:  [name] [name] is a 68 y/o female with a history of anemia, CKD w/ fistula 07/25, CAD s/p NSTEMI, VTE, lung cancer s/p LLL (2022), HTN, hyperlipidemia, T2DM, right BKA 2021, CVA, PE, depression, tobacco/ cocaine use and HFrEF (12/24).   Echo 05/30/23: EF 25%, G1DD, severe RV dysfunction  Admitted 06/21/23 due to being volume overloaded. Recent admission prior to that from 12/9 - 06/18/23 (at that time new HF diagnosis, suspected mixed etiology) then did not pick up meds at DC. This January admission pBNP was 89k (131k prior). IV diuresed. BL effusions on CXR. Resumed home GDMT/HTN agents. DC at 115 lbs.   Admitted again a few days later 06/28/23 with hypoglycemia. Incorrect home insulin  administration, which was corrected in hospital. One dose of IV lasix  in hospital. AKI at admission which was improving at DC.   Admitted 07/10/23 with SOB/ cough. Had stopped taking insulin  due to her concern about hypoglycemia although glucose levels have been 400-500's. Cr 4.7 (from 3.8), BUN 76, glucose 324, Hgb 8.6 (around baseline), lipase 110, BHB <0.18, VBG 7.33/43, lactate 1.4, pro-BNP 4321. CXR stable. Given IVF with improvement of renal function. Endocrinology consulted.   Admitted 10/01/23 with HF exacerbation after running out of diuretics 4 days prior. + UDS for cocaine. proBNP was over 32,000 and troponins were 88 when she came in and peaked at 448 but then down-trended quickly. Did not have EKG findings of new ischemia. Cardiology consulted. IV diuresed and weaned to room air.    Echo 12/31/23: EF 50-55%, G2DD, mild MR, normal RV   Was in the ED 01/20/24 with pain and swelling of the left arm after recent AV graft placement. Surgical graft placement on January 10, 2024 by vascular surgery Dr. Marea. Patient  reports since then she has had gradually worsening pain and swelling of the area. Ultrasound negative, vascular consulted. Antibiotics given.   Admitted 01/29/24 with two weeks of progressive sob worse over the last 3-4 days before presentation.Received lasix  drip with transition to oral diuretics. Cardiology and nephrology consults obtained.   She presents today for her initial HF visit with a chief complaint of shortness of breath. Has associated moderate fatigue. Sleeping well on 1 pillow. Has a Right BKA. Denies chest pain, dizziness, edema, palpitations. Has fistula in left arm for future dialysis. Has not taken her medications yet today and admits that she occasionally forgets them.    Review of Systems: [y] = yes, [ ]  = no   General: Weight gain [ ] ; Weight loss [ ] ; Anorexia [ ] ; Fatigue davis.dad ]; Fever [ ] ; Chills [ ] ; Weakness [ ]   Cardiac: Chest pain/pressure [ ] ; Resting SOB [ ] ; Exertional SOB davis.dad ]; Orthopnea [ ] ; Pedal Edema [ ] ; Palpitations [ ] ; Syncope [ ] ; Presyncope [ ] ; Paroxysmal nocturnal dyspnea[ ]   Pulmonary: Cough [ ] ; Wheezing[ ] ; Hemoptysis[ ] ; Sputum [ ] ; Snoring [ ]   GI: Vomiting[ ] ; Dysphagia[ ] ; Melena[ ] ; Hematochezia [ ] ; Heartburn[ ] ; Abdominal pain [ ] ; Constipation [ ] ; Diarrhea [ ] ; BRBPR [ ]   GU: Hematuria[ ] ; Dysuria [ ] ; Nocturia[ ]   Vascular: Pain in legs with walking [ ] ; Pain in feet with lying flat [ ] ; Non-healing sores [ ] ; Stroke [ y]; TIA [ ] ; Slurred speech [ ] ;  Neuro: Headaches[ ] ; Vertigo[ ] ;  Seizures[ ] ; Paresthesias[ ] ;Blurred vision [ ] ; Diplopia [ ] ; Vision changes [ ]   Ortho/Skin: Arthritis [ ] ; Joint pain [ ] ; Muscle pain [ ] ; Joint swelling [ ] ; Back Pain [ ] ; Rash [ ]   Psych: Depression[ ] ; Anxiety[ ]   Heme: Bleeding problems [ ] ; Clotting disorders [ ] ; Anemia [ y]  Endocrine: Diabetes [ y]; Thyroid  dysfunction[ ]    Past Medical History:  Diagnosis Date   Adenocarcinoma of left lung (HCC)    a.) stage IIIA (pT1c, pN2, cM0) --> s/p LLL  lobectomy + 4 cycles of carbo/taxol  + 1 cycle atezolizumab  (discontinued for grade IV toxicity)   Anemia of chronic renal failure    Cerebral microvascular disease    CKD (chronic kidney disease), stage V (HCC)    Cocaine abuse (HCC)    Coronary artery disease    a.) LHC (NSTEMI) 05/30/2010: 10% pLCxm 20% pRCA-1, 100% pRCA-2 - med mgmt; b.) LHC 02/24/2020: CTA pRCA with good L-R collaterals, 50% mLAD, 80% OM1 - med mgmt   Depression    Diabetic foot ulcer (HCC) 10/03/2019   Dyspnea    Elevated LEFT hemidiaphragm    HFrEF (heart failure with reduced ejection fraction) (HCC) 02/16/2020   a.) Dx'd in setting of acute DVT/PE   Hyperlipidemia    Hypertension    Long-term use of aspirin  therapy    NSTEMI (non-ST elevated myocardial infarction) (HCC) 05/28/2010   a.) troponins were trended: 1.70 --> 3.30 --> 4.90 ng/mL; b.) LHC 05/30/2010: 10% pLCx, 20% pRCA-1, 100% pRCA-2 (chronic) with good collaterals - med mgmt   Peripheral artery disease    a.) 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   Systolic ejection murmur    T2DM (type 2 diabetes mellitus) (HCC)    Venous thromboembolism (VTE) 02/15/2020   a.) 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; CTA (+) for multiple RIGHT segmental/subsegment pulmonary emboli    Current Outpatient Medications  Medication Sig Dispense Refill   ACCU-CHEK GUIDE TEST test strip 3 (three) times daily.     aspirin  EC 81 MG tablet Take 81 mg by mouth in the morning. Swallow whole.     atorvastatin  (LIPITOR ) 80 MG tablet Take 1 tablet (80 mg total) by mouth daily. 30 tablet 5   calcitRIOL (ROCALTROL) 0.25 MCG capsule Take 0.25 mcg by mouth daily.     calcium  acetate (PHOSLO) 667 MG capsule Take 667 mg by mouth.     carboxymethylcellulose (REFRESH PLUS) 0.5 % SOLN Place 1 drop into both eyes in the morning and at bedtime.     CAREFINE PEN NEEDLES 32G X 4 MM MISC CAREFINE PEN NEEDLES 32G X 4 MM     carvedilol  (COREG ) 25 MG tablet Take 1 tablet (25 mg total) by mouth 2 (two) times daily.  180 tablet 3   Continuous Glucose Receiver (DEXCOM G7 RECEIVER) DEVI Use as instructed with G7 sensors     Continuous Glucose Sensor (DEXCOM G7 SENSOR) MISC Place one sensor on the skin every 10 days for use with Dexcom reader. 9 each 1   DULoxetine  (CYMBALTA ) 20 MG capsule Take 2 capsules (40 mg total) by mouth daily.     ezetimibe  (ZETIA ) 10 MG tablet Take 1 tablet (10 mg total) by mouth daily.     Glucagon 3 MG/DOSE POWD Use 1 spray in 1 nostril for severe hypoglycemia, as per package instructions     hydrALAZINE  (APRESOLINE ) 100 MG tablet Take 1 tablet (100 mg total) by mouth 3 (three) times daily. 90 tablet 5   insulin   lispro (HUMALOG ) 100 UNIT/ML injection Inject 4 Units into the skin 3 (three) times daily with meals. plus sliding scale before meals Insulin  dose 70-150 0 units 151-200 1 unit, 201-250 2 units, 251-300 3 units, 301-350 4 units, 351+5 units     isosorbide  dinitrate (ISORDIL ) 40 MG tablet Take 1 tablet (40 mg total) by mouth 3 (three) times daily.     LANTUS  SOLOSTAR 100 UNIT/ML Solostar Pen Inject 4 Units into the skin at bedtime.     metolazone  (ZAROXOLYN ) 2.5 MG tablet Take 1 tablet (2.5 mg total) by mouth once a week.     torsemide  40 MG TABS Take 80 mg by mouth daily.     No current facility-administered medications for this visit.   Facility-Administered Medications Ordered in Other Visits  Medication Dose Route Frequency Provider Last Rate Last Admin   heparin  lock flush 100 UNIT/ML injection            heparin  lock flush 100 unit/mL  500 Units Intravenous Once Yu, Zhou, MD        Allergies  Allergen Reactions   Lisinopril Other (See Comments) and Swelling    Recurrent AKI and hyperkalemia when initiation attempted.  Other Reaction(s): Facial edema  Other Reaction(s): Facial edema, hyperkalemia, Other (See Comments)    Recurrent AKI and hyperkalemia when initiation attempted. Other Reaction(s): Facial edema    Recurrent AKI and hyperkalemia when initiation  attempted.  Other Reaction(s): Facial edema, hyperkalemia  Recurrent AKI and hyperkalemia when initiation attempted.  Other Reaction(s): Facial edema, hyperkalemia  Recurrent AKI and hyperkalemia when initiation attempted.  Other Reaction(s): Facial edema, hyperkalemia   Cyanoacrylate Dermatitis      Social History   Socioeconomic History   Marital status: Married    Spouse name: Not on file   Number of children: Not on file   Years of education: Not on file   Highest education level: Not on file  Occupational History   Not on file  Tobacco Use   Smoking status: Former    Current packs/day: 0.00    Types: Cigars, Cigarettes    Quit date: 01/17/2021    Years since quitting: 3.3   Smokeless tobacco: Never  Vaping Use   Vaping status: Never Used  Substance and Sexual Activity   Alcohol  use: Never   Drug use: Not Currently   Sexual activity: Not Currently  Other Topics Concern   Not on file  Social History Narrative   Not on file   Social Drivers of Health   Financial Resource Strain: Low Risk  (06/29/2023)   Received from [facility]   Overall Financial Resource Strain (CARDIA)    Difficulty of Paying Living Expenses: Not hard at all  Food Insecurity: No Food Insecurity (05/08/2024)   Hunger Vital Sign    Worried About Running Out of Food in the Last Year: Never true    Ran Out of Food in the Last Year: Never true  Transportation Needs: No Transportation Needs (05/08/2024)   PRAPARE - Administrator, Civil Service (Medical): No    Lack of Transportation (Non-Medical): No  Physical Activity: Not on file  Stress: Stress Concern Present (12/15/2021)   [employer] of Occupational Health - Occupational Stress Questionnaire    Feeling of Stress : To some extent  Social Connections: Moderately Integrated (05/08/2024)   Social Connection and Isolation Panel    Frequency of Communication with Friends and Family: More than three times a week  Frequency of Social Gatherings with Friends and Family: More than three times a week    Attends Religious Services: More than 4 times per year    Active Member of Clubs or Organizations: Yes    Attends Banker Meetings: More than 4 times per year    Marital Status: Widowed  Intimate Partner Violence: Not At Risk (05/08/2024)   Humiliation, Afraid, Rape, and Kick questionnaire    Fear of Current or Ex-Partner: No    Emotionally Abused: No    Physically Abused: No    Sexually Abused: No      Family History  Problem Relation Age of Onset   Heart disease Mother    Diabetes Mother    Heart disease Father    Diabetes Father    There were no vitals filed for this visit.  Wt Readings from Last 3 Encounters:  05/13/24 116 lb 13.5 oz (53 kg)  03/18/24 114 lb (51.7 kg)  02/29/24 114 lb (51.7 kg)   Lab Results  Component Value Date   CREATININE 4.08 (H) 05/13/2024   CREATININE 3.93 (H) 05/12/2024   CREATININE 4.00 (H) 05/11/2024    PHYSICAL EXAM:  General: Well appearing female in wheelchair.  Cor: No JVD. Regular rhythm, rate.  Lungs: clear Abdomen: soft, nontender, nondistended. Extremities: 1+ pitting edema left lower leg. Prosthesis on right leg Neuro:. Affect pleasant   ECG: not done   ASSESSMENT & PLAN:  1: ICM with preserved ejection fraction- - multivessel CAD - NYHA [name] III - euvolemic - weighing daily - Echo 05/30/23: EF 25%, G1DD, severe RV dysfunction - Echo 12/31/23: EF 50-55%, G2DD, mild MR, normal RV  - saw [facility] cardiology 06/25; returns 09/25 - increase carvedilol  to 25mg  BID for BP - continue torsemide  80mg  daily - CKD prohibits MRA/ ARNi/ ARB/ SGLT2 - get compression sock and wear it daily on her left leg - elevate legs when sitting for long periods of time - RN nurse navigator reviewed HF information with patient - BNP 01/29/24 was 3838.7  2: HTN- - BP 188/81 but hasn't taken meds yet today. She is interested in pill packing so  will send RX's to [facility] pharmacy for this.  - continue hydralazine / isordil  TID - saw PCP Vinie) 08/25 - BMET 02/07/24 reviewed: sodium 135, potassium 4.7, creatinine 3.63 & GFR 13  3: T2DM- - A1c 02/01/24 was 7.3% - to see endocrinology (09/25)  4: PVD- - right BKA 2021 - saw vascular Luna) 06/25  5: CKD- - fistula placed 07/25 for future dialysis - saw nephrology Geoffry) 08/25  6: Hyperlipidemia- - LDL 08/31/23 was 122 - continue atorvastatin  80mg  daily   Return in 1 month, sooner if needed.   [name] DELENA Class, FNP 05/20/24

## 2024-05-20 NOTE — Telephone Encounter (Signed)
 Called to confirm/remind patient of their appointment [facility] on 05/21/24.   Appointment:   [] Confirmed  [x] Left mess   [] No answer/No voice mail  [] VM Full/unable to leave message  [] Phone not in service  Patient reminded to bring all medications and/or complete list.  Confirmed patient has transportation. Gave directions, instructed to utilize valet parking.

## 2024-05-21 ENCOUNTER — Telehealth: Payer: Self-pay | Admitting: Family

## 2024-05-21 ENCOUNTER — Ambulatory Visit: Admitting: Family

## 2024-05-21 NOTE — Telephone Encounter (Signed)
 Patient did not show for [facility] appointment on 05/21/24. This is the 4th appointment she has missed.

## 2024-05-23 ENCOUNTER — Encounter: Payer: Self-pay | Admitting: Oncology

## 2024-05-28 ENCOUNTER — Ambulatory Visit

## 2024-05-28 ENCOUNTER — Telehealth: Payer: Self-pay

## 2024-05-28 NOTE — Telephone Encounter (Signed)
 Copied from CRM #[id number]. Topic: Appointments - Scheduling Inquiry for Clinic >> May 28, 2024  1:50 PM Harlene ORN wrote: Reason for CRM: Patient missed her appointment today 12/10 @ 1:20 pm, because she just got out of assisted living.

## 2024-05-30 ENCOUNTER — Telehealth: Payer: Self-pay

## 2024-05-30 NOTE — Telephone Encounter (Signed)
 Called Jennifer left VM giving verbal orders. Name was on VM.  KP

## 2024-05-30 NOTE — Telephone Encounter (Signed)
 Please review.  KP

## 2024-05-30 NOTE — Telephone Encounter (Signed)
 Copied from CRM #[id number]. Topic: Clinical - Medication Question >> May 30, 2024 12:54 PM Shanda MATSU wrote: Reason for CRM: [facility] called in stating that patient was just discharged from rehab facility, on patient's discharge paperwork med, metolazone  (ZAROXOLYN ) 2.5 MG tablet, is listed to be taken once a week, med has no end date but patient does not have this med and rehab facility did not provide the med to her so caller is wanting to confirm if patient still needs to be taking it, is req a call back. >> May 30, 2024  1:00 PM Shanda MATSU wrote: Caller also req that patient be called back to adv if she needs to continue taking med.

## 2024-05-30 NOTE — Telephone Encounter (Signed)
 Attempted call to patient, LVM but no details. Yes she needs to continue the metolazone  once weekly. If new rx is needed, I just need to know where to send it.

## 2024-05-30 NOTE — Telephone Encounter (Signed)
 Copied from CRM #[id number]. Topic: Clinical - Home Health Verbal Orders >> May 30, 2024 12:57 PM Shanda MATSU wrote: Caller/Agency: [facility] Callback Number: [phone] Service Requested: Skilled Nursing Frequency: one time so far for nursing eval Any new concerns about the patient? No

## 2024-06-04 ENCOUNTER — Telehealth: Payer: Self-pay | Admitting: Physician Assistant

## 2024-06-04 NOTE — Telephone Encounter (Signed)
 Copied from CRM #[id number]. Topic: Medicare AWV >> Jun 04, 2024  9:57 AM Nathanel DEL wrote: Called LVM 06/04/2024 to cancel her WTM to change to AWVI with [facility]. Please schedule. Khc  Nathanel Paschal; Care Guide Ambulatory Clinical Support [place] [facility] Direct Dial: [phone]

## 2024-06-04 NOTE — Telephone Encounter (Signed)
 Called [name] [name] from [facility] LVM to call me back on my cell number

## 2024-06-05 ENCOUNTER — Ambulatory Visit: Admitting: Physician Assistant

## 2024-06-10 ENCOUNTER — Encounter: Admitting: Physician Assistant

## 2024-06-12 ENCOUNTER — Other Ambulatory Visit: Payer: Self-pay | Admitting: Physician Assistant

## 2024-06-13 NOTE — Telephone Encounter (Signed)
 Requested Prescriptions  Pending Prescriptions Disp Refills   DULoxetine  (CYMBALTA ) 20 MG capsule [Pharmacy Med Name: duloxetine  20 mg capsule,delayed release] 240 capsule 0    Sig: TAKE TWO CAPSULES (40 MG TOTAL) BY MOUTH TWICE DAILY AT 9AM & 5PM     Psychiatry: Antidepressants - SNRI - duloxetine  Failed - 06/13/2024  6:41 PM      Failed - Cr in normal range and within 360 days    Creatinine  Date Value Ref Range Status  04/24/2023 3.36 (H) 0.44 - 1.00 mg/dL Final  89/84/7984 9.24 0.60 - 1.30 mg/dL Final   Creatinine, Ser  Date Value Ref Range Status  05/13/2024 4.08 (H) 0.44 - 1.00 mg/dL Final   Creatinine, Urine  Date Value Ref Range Status  05/26/2023 96 mg/dL Final    Comment:    Performed at [facility], [address] [place], [place] [address]         Failed - eGFR is 30 or above and within 360 days    EGFR (African American)  Date Value Ref Range Status  04/02/2014 >60 >1mL/min Final  11/11/2012 53 (L)  Final   GFR calc Af Amer  Date Value Ref Range Status  08/10/2020 57 (L) >59 mL/min/1.73 Final    Comment:    **In accordance with recommendations from the NKF-ASN Task force,**   [facility] is in the process of updating its eGFR calculation to the   2021 CKD-EPI creatinine equation that estimates kidney function   without a race variable.    EGFR (Non-African Amer.)  Date Value Ref Range Status  04/02/2014 >60 >76mL/min Final    Comment:    eGFR values <63mL/min/1.73 m2 may be an indication of chronic kidney disease (CKD). Calculated eGFR, using the MRDR Study equation, is useful in  patients with stable renal function. The eGFR calculation will not be reliable in acutely ill patients when serum creatinine is changing rapidly. It is not useful in patients on dialysis. The eGFR calculation may not be applicable to patients at the low and high extremes of body sizes, pregnant women, and vegetarians.   11/11/2012 46 (L)  Final    Comment:     eGFR values <27mL/min/1.73 m2 may be an indication of chronic kidney disease (CKD). Calculated eGFR is useful in patients with stable renal function. The eGFR calculation will not be reliable in acutely ill patients when serum creatinine is changing rapidly. It is not useful in  patients on dialysis. The eGFR calculation may not be applicable to patients at the low and high extremes of body sizes, pregnant women, and vegetarians.    GFR, Estimated  Date Value Ref Range Status  05/13/2024 11 (L) >60 mL/min Final    Comment:    (NOTE) Calculated using the CKD-EPI Creatinine Equation (2021)   04/24/2023 15 (L) >60 mL/min Final    Comment:    (NOTE) Calculated using the CKD-EPI Creatinine Equation (2021)    eGFR  Date Value Ref Range Status  07/09/2023 10 (L) >59 mL/min/1.73 Final         Passed - Completed PHQ-2 or PHQ-9 in the last 360 days      Passed - Last BP in normal range    BP Readings from Last 1 Encounters:  05/13/24 126/63         Passed - Valid encounter within last 6 months    Recent Outpatient Visits           4 months ago Chronic systolic  congestive heart failure [facility])    Primary Care & Sports Medicine at [facility], [place], [place]   6 months ago Encounter for screening mammogram for breast cancer   [facility] Primary Care & Sports Medicine at [facility], [place]   6 months ago Type 2 diabetes mellitus with stage 5 chronic kidney disease not on chronic dialysis, with long-term current use of insulin  [facility])   [facility] Primary Care & Sports Medicine at [facility], [place], [place]

## 2024-06-17 ENCOUNTER — Other Ambulatory Visit: Payer: Self-pay | Admitting: Physician Assistant

## 2024-06-18 ENCOUNTER — Telehealth: Payer: Self-pay | Admitting: Family

## 2024-06-18 NOTE — Telephone Encounter (Signed)
 Called to r/s appt from 12/3

## 2024-06-18 NOTE — Telephone Encounter (Signed)
 Already refilled on 06/13/24 in a separate refill encounter. Will refuse this request due to this.   Requested Prescriptions  Pending Prescriptions Disp Refills   DULoxetine  (CYMBALTA ) 20 MG capsule 360 capsule 0     Psychiatry: Antidepressants - SNRI - duloxetine  Failed - 06/18/2024  3:38 PM      Failed - Cr in normal range and within 360 days    Creatinine  Date Value Ref Range Status  04/24/2023 3.36 (H) 0.44 - 1.00 mg/dL Final  89/84/7984 9.24 0.60 - 1.30 mg/dL Final   Creatinine, Ser  Date Value Ref Range Status  05/13/2024 4.08 (H) 0.44 - 1.00 mg/dL Final   Creatinine, Urine  Date Value Ref Range Status  05/26/2023 96 mg/dL Final    Comment:    Performed at [facility], [address] [place], [place] [address]         Failed - eGFR is 30 or above and within 360 days    EGFR (African American)  Date Value Ref Range Status  04/02/2014 >60 >62mL/min Final  11/11/2012 53 (L)  Final   GFR calc Af Amer  Date Value Ref Range Status  08/10/2020 57 (L) >59 mL/min/1.73 Final    Comment:    **In accordance with recommendations from the NKF-ASN Task force,**   [facility] is in the process of updating its eGFR calculation to the   2021 CKD-EPI creatinine equation that estimates kidney function   without a race variable.    EGFR (Non-African Amer.)  Date Value Ref Range Status  04/02/2014 >60 >63mL/min Final    Comment:    eGFR values <68mL/min/1.73 m2 may be an indication of chronic kidney disease (CKD). Calculated eGFR, using the MRDR Study equation, is useful in  patients with stable renal function. The eGFR calculation will not be reliable in acutely ill patients when serum creatinine is changing rapidly. It is not useful in patients on dialysis. The eGFR calculation may not be applicable to patients at the low and high extremes of body sizes, pregnant women, and vegetarians.   11/11/2012 46 (L)  Final    Comment:    eGFR values <52mL/min/1.73 m2 may be  an indication of chronic kidney disease (CKD). Calculated eGFR is useful in patients with stable renal function. The eGFR calculation will not be reliable in acutely ill patients when serum creatinine is changing rapidly. It is not useful in  patients on dialysis. The eGFR calculation may not be applicable to patients at the low and high extremes of body sizes, pregnant women, and vegetarians.    GFR, Estimated  Date Value Ref Range Status  05/13/2024 11 (L) >60 mL/min Final    Comment:    (NOTE) Calculated using the CKD-EPI Creatinine Equation (2021)   04/24/2023 15 (L) >60 mL/min Final    Comment:    (NOTE) Calculated using the CKD-EPI Creatinine Equation (2021)    eGFR  Date Value Ref Range Status  07/09/2023 10 (L) >59 mL/min/1.73 Final         Passed - Completed PHQ-2 or PHQ-9 in the last 360 days      Passed - Last BP in normal range    BP Readings from Last 1 Encounters:  05/13/24 126/63         Passed - Valid encounter within last 6 months    Recent Outpatient Visits           4 months ago Chronic systolic congestive heart failure (HCC)   [place]  Primary Care & Sports Medicine at [facility], [place], [place]   6 months ago Encounter for screening mammogram for breast cancer   [place] Primary Care & Sports Medicine at [facility], [place], [place]   6 months ago Type 2 diabetes mellitus with stage 5 chronic kidney disease not on chronic dialysis, with long-term current use of insulin  [facility])   [facility] Primary Care & Sports Medicine at [facility], [place], [place]

## 2024-06-23 ENCOUNTER — Encounter: Payer: Self-pay | Admitting: Oncology

## 2024-06-23 ENCOUNTER — Ambulatory Visit: Admitting: Physician Assistant

## 2024-06-23 ENCOUNTER — Encounter: Payer: Self-pay | Admitting: Physician Assistant

## 2024-06-24 ENCOUNTER — Other Ambulatory Visit: Payer: Self-pay | Admitting: Physician Assistant

## 2024-06-24 NOTE — Telephone Encounter (Unsigned)
 Copied from CRM [id number]. Topic: Clinical - Medication Refill >> Jun 24, 2024 10:06 AM Sophia H wrote: Medication: DULoxetine  (CYMBALTA ) 20 MG capsule ketoconazole  (NIZORAL ) 2 % cream (from old provider, wants to know if PCP will pick up)  Has the patient contacted their pharmacy? Yes, pharmacy is requesting on behalf of patient.   This is the patient's preferred pharmacy:  [facility] [place], [place] - [address] [place] [address] Phone: [phone] Fax: [phone]   Is this the correct pharmacy for this prescription? Yes If no, delete pharmacy and type the correct one.   Has the prescription been filled recently? Yes  Is the patient out of the medication? Yes  Has the patient been seen for an appointment in the last year OR does the patient have an upcoming appointment? Yes, seen Jan 5.   Can we respond through MyChart? Yes  Agent: Please be advised that Rx refills may take up to 3 business days. We ask that you follow-up with your pharmacy.

## 2024-06-24 NOTE — Telephone Encounter (Signed)
" °  ketoconazole  (NIZORAL ) 2 % cream (from old provider, wants to know if PCP will pick up)  "

## 2024-06-25 NOTE — Telephone Encounter (Signed)
 Duplicate request.  Requested Prescriptions  Pending Prescriptions Disp Refills   DULoxetine  (CYMBALTA ) 20 MG capsule 360 capsule 0     Psychiatry: Antidepressants - SNRI - duloxetine  Failed - 06/25/2024  4:22 PM      Failed - Cr in normal range and within 360 days    Creatinine  Date Value Ref Range Status  04/24/2023 3.36 (H) 0.44 - 1.00 mg/dL Final  89/84/7984 9.24 0.60 - 1.30 mg/dL Final   Creatinine, Ser  Date Value Ref Range Status  05/13/2024 4.08 (H) 0.44 - 1.00 mg/dL Final   Creatinine, Urine  Date Value Ref Range Status  05/26/2023 96 mg/dL Final    Comment:    Performed at [facility], [address] [place], [place] [address]         Failed - eGFR is 30 or above and within 360 days    EGFR (African American)  Date Value Ref Range Status  04/02/2014 >60 >52mL/min Final  11/11/2012 53 (L)  Final   GFR calc Af Amer  Date Value Ref Range Status  08/10/2020 57 (L) >59 mL/min/1.73 Final    Comment:    **In accordance with recommendations from the NKF-ASN Task force,**   [facility] is in the process of updating its eGFR calculation to the   2021 CKD-EPI creatinine equation that estimates kidney function   without a race variable.    EGFR (Non-African Amer.)  Date Value Ref Range Status  04/02/2014 >60 >14mL/min Final    Comment:    eGFR values <27mL/min/1.73 m2 may be an indication of chronic kidney disease (CKD). Calculated eGFR, using the MRDR Study equation, is useful in  patients with stable renal function. The eGFR calculation will not be reliable in acutely ill patients when serum creatinine is changing rapidly. It is not useful in patients on dialysis. The eGFR calculation may not be applicable to patients at the low and high extremes of body sizes, pregnant women, and vegetarians.   11/11/2012 46 (L)  Final    Comment:    eGFR values <80mL/min/1.73 m2 may be an indication of chronic kidney disease (CKD). Calculated eGFR is useful in  patients with stable renal function. The eGFR calculation will not be reliable in acutely ill patients when serum creatinine is changing rapidly. It is not useful in  patients on dialysis. The eGFR calculation may not be applicable to patients at the low and high extremes of body sizes, pregnant women, and vegetarians.    GFR, Estimated  Date Value Ref Range Status  05/13/2024 11 (L) >60 mL/min Final    Comment:    (NOTE) Calculated using the CKD-EPI Creatinine Equation (2021)   04/24/2023 15 (L) >60 mL/min Final    Comment:    (NOTE) Calculated using the CKD-EPI Creatinine Equation (2021)    eGFR  Date Value Ref Range Status  07/09/2023 10 (L) >59 mL/min/1.73 Final         Passed - Completed PHQ-2 or PHQ-9 in the last 360 days      Passed - Last BP in normal range    BP Readings from Last 1 Encounters:  05/13/24 126/63         Passed - Valid encounter within last 6 months    Recent Outpatient Visits           4 months ago Chronic systolic congestive heart failure [facility])   [facility] Primary Care & Sports Medicine at [facility], [place], [place]   6  months ago Encounter for screening mammogram for breast cancer    Primary Care & Sports Medicine at [facility], [place], [place]   6 months ago Type 2 diabetes mellitus with stage 5 chronic kidney disease not on chronic dialysis, with long-term current use of insulin  [facility])   [facility] Primary Care & Sports Medicine at [facility], [place], [place]

## 2024-07-01 ENCOUNTER — Other Ambulatory Visit: Payer: Self-pay | Admitting: Physician Assistant

## 2024-07-01 NOTE — Telephone Encounter (Unsigned)
 Copied from CRM [id number]. Topic: Clinical - Medication Refill >> Jul 01, 2024 11:55 AM Thersia C wrote: Medication: DULoxetine  (CYMBALTA ) 20 MG capsule  Has the patient contacted their pharmacy? Yes (Agent: If no, request that the patient contact the pharmacy for the refill. If patient does not wish to contact the pharmacy document the reason why and proceed with request.) (Agent: If yes, when and what did the pharmacy advise?)  This is the patient's preferred pharmacy:  [facility] - [place], [place] - [address] [place] [address] Phone: [phone] Fax: [phone]   Is this the correct pharmacy for this prescription? Yes If no, delete pharmacy and type the correct one.   Has the prescription been filled recently? No  Is the patient out of the medication? Yes  Has the patient been seen for an appointment in the last year OR does the patient have an upcoming appointment? Yes  Can we respond through MyChart? Yes  Agent: Please be advised that Rx refills may take up to 3 business days. We ask that you follow-up with your pharmacy.

## 2024-07-02 ENCOUNTER — Encounter: Payer: Self-pay | Admitting: Oncology

## 2024-07-02 NOTE — Telephone Encounter (Signed)
 Too soon for refill, duplicate request.  Requested Prescriptions  Pending Prescriptions Disp Refills   DULoxetine  (CYMBALTA ) 20 MG capsule 360 capsule 0     Psychiatry: Antidepressants - SNRI - duloxetine  Failed - 07/02/2024  1:16 PM      Failed - Cr in normal range and within 360 days    Creatinine  Date Value Ref Range Status  04/24/2023 3.36 (H) 0.44 - 1.00 mg/dL Final  89/84/7984 9.24 0.60 - 1.30 mg/dL Final   Creatinine, Ser  Date Value Ref Range Status  05/13/2024 4.08 (H) 0.44 - 1.00 mg/dL Final   Creatinine, Urine  Date Value Ref Range Status  05/26/2023 96 mg/dL Final    Comment:    Performed at [facility], [address] [place], [place] [address]         Failed - eGFR is 30 or above and within 360 days    EGFR (African American)  Date Value Ref Range Status  04/02/2014 >60 >96mL/min Final  11/11/2012 53 (L)  Final   GFR calc Af Amer  Date Value Ref Range Status  08/10/2020 57 (L) >59 mL/min/1.73 Final    Comment:    **In accordance with recommendations from the NKF-ASN Task force,**   [facility] is in the process of updating its eGFR calculation to the   2021 CKD-EPI creatinine equation that estimates kidney function   without a race variable.    EGFR (Non-African Amer.)  Date Value Ref Range Status  04/02/2014 >60 >33mL/min Final    Comment:    eGFR values <68mL/min/1.73 m2 may be an indication of chronic kidney disease (CKD). Calculated eGFR, using the MRDR Study equation, is useful in  patients with stable renal function. The eGFR calculation will not be reliable in acutely ill patients when serum creatinine is changing rapidly. It is not useful in patients on dialysis. The eGFR calculation may not be applicable to patients at the low and high extremes of body sizes, pregnant women, and vegetarians.   11/11/2012 46 (L)  Final    Comment:    eGFR values <62mL/min/1.73 m2 may be an indication of chronic kidney disease (CKD). Calculated  eGFR is useful in patients with stable renal function. The eGFR calculation will not be reliable in acutely ill patients when serum creatinine is changing rapidly. It is not useful in  patients on dialysis. The eGFR calculation may not be applicable to patients at the low and high extremes of body sizes, pregnant women, and vegetarians.    GFR, Estimated  Date Value Ref Range Status  05/13/2024 11 (L) >60 mL/min Final    Comment:    (NOTE) Calculated using the CKD-EPI Creatinine Equation (2021)   04/24/2023 15 (L) >60 mL/min Final    Comment:    (NOTE) Calculated using the CKD-EPI Creatinine Equation (2021)    eGFR  Date Value Ref Range Status  07/09/2023 10 (L) >59 mL/min/1.73 Final         Passed - Completed PHQ-2 or PHQ-9 in the last 360 days      Passed - Last BP in normal range    BP Readings from Last 1 Encounters:  05/13/24 126/63         Passed - Valid encounter within last 6 months    Recent Outpatient Visits           4 months ago Chronic systolic congestive heart failure [facility])   [facility] Primary Care & Sports Medicine at [facility], [place]   6 months ago Encounter for screening mammogram for breast cancer   [place] Primary Care & Sports Medicine at [facility], [place], [place]   7 months ago Type 2 diabetes mellitus with stage 5 chronic kidney disease not on chronic dialysis, with long-term current use of insulin  [facility])   [facility] Primary Care & Sports Medicine at [facility], [place], [place]

## 2024-07-03 ENCOUNTER — Other Ambulatory Visit: Payer: Self-pay | Admitting: Physician Assistant

## 2024-07-03 NOTE — Telephone Encounter (Signed)
 Called patient to inquire about reasoning for ketoconazole  (NIZORAL ) 2 % cream [[id number]] refill request. Patient states this medication is for her foot and reports this as an ongoing/chronic issue. No new symptoms reported.

## 2024-07-03 NOTE — Telephone Encounter (Signed)
 Copied from CRM [id number]. Topic: Clinical - Medication Refill >> Jul 03, 2024  3:51 PM Delon HERO wrote: Medication: calcium  acetate (PHOSLO) 667 MG capsule [[id number]] ketoconazole  (NIZORAL ) 2 % cream [[id number]] DISCONTINUED atorvastatin  (LIPITOR ) 80 MG tablet [[id number]]   Has the patient contacted their pharmacy? Yes (Agent: If no, request that the patient contact the pharmacy for the refill. If patient does not wish to contact the pharmacy document the reason why and proceed with request.) (Agent: If yes, when and what did the pharmacy advise?)  This is the patient's preferred pharmacy:  [facility] - [place], [place] - [address] [place] [address] Phone: [phone] Fax: [phone]    Is this the correct pharmacy for this prescription? Yes If no, delete pharmacy and type the correct one.   Has the prescription been filled recently? Yes  Is the patient out of the medication? Yes  Has the patient been seen for an appointment in the last year OR does the patient have an upcoming appointment? Yes  Can we respond through MyChart? Yes  Agent: Please be advised that Rx refills may take up to 3 business days. We ask that you follow-up with your pharmacy.

## 2024-07-10 ENCOUNTER — Other Ambulatory Visit: Payer: Self-pay | Admitting: Physician Assistant

## 2024-07-10 ENCOUNTER — Telehealth: Payer: Self-pay | Admitting: Family

## 2024-07-10 ENCOUNTER — Ambulatory Visit

## 2024-07-10 DIAGNOSIS — E1122 Type 2 diabetes mellitus with diabetic chronic kidney disease: Secondary | ICD-10-CM

## 2024-07-10 MED ORDER — DEXCOM G7 SENSOR MISC: 1 refills | Status: AC

## 2024-07-10 NOTE — Telephone Encounter (Signed)
 Requested Prescriptions  Pending Prescriptions Disp Refills   Continuous Glucose Sensor (DEXCOM G7 SENSOR) MISC 9 each 1    Sig: Place one sensor on the skin every 10 days for use with Dexcom reader.     Endocrinology: Diabetes - Testing Supplies Passed - 07/10/2024  1:39 PM      Passed - Valid encounter within last 12 months    Recent Outpatient Visits           5 months ago Chronic systolic congestive heart failure [facility])   [place] Primary Care & Sports Medicine at [facility], [place], [place]   7 months ago Encounter for screening mammogram for breast cancer   [place] Primary Care & Sports Medicine at [facility], [place], [place]   7 months ago Type 2 diabetes mellitus with stage 5 chronic kidney disease not on chronic dialysis, with long-term current use of insulin  [facility])   [facility] Primary Care & Sports Medicine at [facility], [place], [place]

## 2024-07-10 NOTE — Telephone Encounter (Signed)
 [facility] request  refill for Carvedilol  (Tab) 4.5 mg 100 day supply [place], [place]  [phone], please advise

## 2024-07-10 NOTE — Telephone Encounter (Signed)
 Copied from CRM #[id number]. Topic: Clinical - Medication Refill >> Jul 10, 2024  8:56 AM Berwyn MATSU wrote: Medication: Continuous Glucose Sensor (DEXCOM G7 SENSOR) MISC  Patient is requesting for a 100 day supply   Has the patient contacted their pharmacy? Yes (Agent: If no, request that the patient contact the pharmacy for the refill. If patient does not wish to contact the pharmacy document the reason why and proceed with request.) (Agent: If yes, when and what did the pharmacy advise?)  This is the patient's preferred pharmacy:   [facility] - [place], [place] - [address] [place] [address] Phone: [phone] Fax: [phone]-   Is this the correct pharmacy for this prescription? Yes If no, delete pharmacy and type the correct one.   Has the prescription been filled recently? Yes  Is the patient out of the medication? Yes  Has the patient been seen for an appointment in the last year OR does the patient have an upcoming appointment? Yes  Can we respond through MyChart? No  Agent: Please be advised that Rx refills may take up to 3 business days. We ask that you follow-up with your pharmacy.

## 2024-07-14 ENCOUNTER — Ambulatory Visit: Admitting: Family

## 2024-07-16 ENCOUNTER — Encounter: Admitting: Physician Assistant

## 2024-07-23 ENCOUNTER — Ambulatory Visit: Admitting: Physician Assistant

## 2024-07-23 ENCOUNTER — Encounter: Payer: Self-pay | Admitting: Oncology

## 2024-07-25 ENCOUNTER — Telehealth: Payer: Self-pay | Admitting: Family

## 2024-07-25 NOTE — Telephone Encounter (Signed)
 Called to confirm/remind patient of their appointment at [facility] on 07/28/24.   Appointment:   [] Confirmed  [x] Left mess   [] No answer/No voice mail  [] VM Full/unable to leave message  [] Phone not in service  Patient reminded to bring all medications and/or complete list.  Confirmed patient has transportation. Gave directions, instructed to utilize valet parking.

## 2024-07-28 ENCOUNTER — Ambulatory Visit: Admitting: Family

## 2024-08-14 ENCOUNTER — Ambulatory Visit
